# Patient Record
Sex: Female | Born: 1960
Health system: Southern US, Community
[De-identification: ages and names within clinical notes are randomized; demographics above are authoritative.]

## PROBLEM LIST (undated history)

## (undated) DIAGNOSIS — M1990 Unspecified osteoarthritis, unspecified site: Secondary | ICD-10-CM

## (undated) DIAGNOSIS — R238 Other skin changes: Secondary | ICD-10-CM

## (undated) DIAGNOSIS — Z5189 Encounter for other specified aftercare: Secondary | ICD-10-CM

## (undated) DIAGNOSIS — E785 Hyperlipidemia, unspecified: Secondary | ICD-10-CM

## (undated) DIAGNOSIS — D649 Anemia, unspecified: Secondary | ICD-10-CM

## (undated) DIAGNOSIS — R1110 Vomiting, unspecified: Secondary | ICD-10-CM

## (undated) DIAGNOSIS — R0602 Shortness of breath: Secondary | ICD-10-CM

## (undated) DIAGNOSIS — I509 Heart failure, unspecified: Secondary | ICD-10-CM

## (undated) DIAGNOSIS — I499 Cardiac arrhythmia, unspecified: Secondary | ICD-10-CM

## (undated) DIAGNOSIS — K284 Chronic or unspecified gastrojejunal ulcer with hemorrhage: Secondary | ICD-10-CM

## (undated) DIAGNOSIS — R7303 Prediabetes: Secondary | ICD-10-CM

## (undated) DIAGNOSIS — K219 Gastro-esophageal reflux disease without esophagitis: Secondary | ICD-10-CM

## (undated) DIAGNOSIS — M7989 Other specified soft tissue disorders: Secondary | ICD-10-CM

## (undated) DIAGNOSIS — IMO0001 Reserved for inherently not codable concepts without codable children: Secondary | ICD-10-CM

## (undated) DIAGNOSIS — I82409 Acute embolism and thrombosis of unspecified deep veins of unspecified lower extremity: Secondary | ICD-10-CM

## (undated) DIAGNOSIS — K5900 Constipation, unspecified: Secondary | ICD-10-CM

## (undated) DIAGNOSIS — R233 Spontaneous ecchymoses: Secondary | ICD-10-CM

## (undated) DIAGNOSIS — K5792 Diverticulitis of intestine, part unspecified, without perforation or abscess without bleeding: Secondary | ICD-10-CM

## (undated) DIAGNOSIS — I4891 Unspecified atrial fibrillation: Secondary | ICD-10-CM

## (undated) DIAGNOSIS — I2699 Other pulmonary embolism without acute cor pulmonale: Secondary | ICD-10-CM

## (undated) DIAGNOSIS — I739 Peripheral vascular disease, unspecified: Secondary | ICD-10-CM

## (undated) DIAGNOSIS — K274 Chronic or unspecified peptic ulcer, site unspecified, with hemorrhage: Secondary | ICD-10-CM

## (undated) DIAGNOSIS — T8859XA Other complications of anesthesia, initial encounter: Secondary | ICD-10-CM

## (undated) DIAGNOSIS — R1310 Dysphagia, unspecified: Secondary | ICD-10-CM

## (undated) DIAGNOSIS — D241 Benign neoplasm of right breast: Secondary | ICD-10-CM

## (undated) HISTORY — PX: JOINT REPLACEMENT: SHX530

## (undated) HISTORY — PX: TONSILLECTOMY: SUR1361

## (undated) HISTORY — DX: Unspecified osteoarthritis, unspecified site: M19.90

## (undated) HISTORY — DX: Diverticulitis of intestine, part unspecified, without perforation or abscess without bleeding: K57.92

## (undated) HISTORY — DX: Hyperlipidemia, unspecified: E78.5

## (undated) HISTORY — PX: CHOLECYSTECTOMY: SHX55

## (undated) HISTORY — DX: Other specified soft tissue disorders: M79.89

## (undated) HISTORY — DX: Spontaneous ecchymoses: R23.3

## (undated) HISTORY — DX: Chronic or unspecified peptic ulcer, site unspecified, with hemorrhage: K27.4

## (undated) HISTORY — PX: OTHER SURGICAL HISTORY: SHX169

## (undated) HISTORY — DX: Chronic or unspecified gastrojejunal ulcer with hemorrhage: K28.4

## (undated) HISTORY — DX: Acute embolism and thrombosis of unspecified deep veins of unspecified lower extremity: I82.409

## (undated) HISTORY — DX: Unspecified atrial fibrillation: I48.91

## (undated) HISTORY — DX: Constipation, unspecified: K59.00

## (undated) HISTORY — DX: Encounter for other specified aftercare: Z51.89

## (undated) HISTORY — DX: Dysphagia, unspecified: R13.10

## (undated) HISTORY — DX: Other skin changes: R23.8

## (undated) HISTORY — PX: APPENDECTOMY: SHX54

## (undated) HISTORY — DX: Gastro-esophageal reflux disease without esophagitis: K21.9

## (undated) HISTORY — PX: DIAGNOSTIC LAPAROSCOPY: SUR761

## (undated) HISTORY — DX: Other pulmonary embolism without acute cor pulmonale: I26.99

## (undated) HISTORY — DX: Vomiting, unspecified: R11.10

## (undated) HISTORY — DX: Reserved for inherently not codable concepts without codable children: IMO0001

---

## 1997-12-10 ENCOUNTER — Ambulatory Visit (HOSPITAL_COMMUNITY): Admission: RE | Admit: 1997-12-10 | Discharge: 1997-12-10 | Payer: Self-pay | Admitting: Gynecology

## 1998-03-22 ENCOUNTER — Ambulatory Visit (HOSPITAL_COMMUNITY): Admission: RE | Admit: 1998-03-22 | Discharge: 1998-03-22 | Payer: Self-pay | Admitting: Gynecology

## 1999-09-11 ENCOUNTER — Encounter: Payer: Self-pay | Admitting: Emergency Medicine

## 1999-09-11 ENCOUNTER — Emergency Department (HOSPITAL_COMMUNITY): Admission: EM | Admit: 1999-09-11 | Discharge: 1999-09-11 | Payer: Self-pay | Admitting: Emergency Medicine

## 2000-08-13 ENCOUNTER — Encounter: Payer: Self-pay | Admitting: Family Medicine

## 2000-08-13 ENCOUNTER — Ambulatory Visit (HOSPITAL_COMMUNITY): Admission: RE | Admit: 2000-08-13 | Discharge: 2000-08-13 | Payer: Self-pay | Admitting: Family Medicine

## 2000-08-14 ENCOUNTER — Encounter (INDEPENDENT_AMBULATORY_CARE_PROVIDER_SITE_OTHER): Payer: Self-pay | Admitting: *Deleted

## 2000-08-14 ENCOUNTER — Ambulatory Visit (HOSPITAL_COMMUNITY): Admission: RE | Admit: 2000-08-14 | Discharge: 2000-08-15 | Payer: Self-pay

## 2000-08-19 ENCOUNTER — Encounter: Payer: Self-pay | Admitting: General Surgery

## 2000-08-19 ENCOUNTER — Encounter: Admission: RE | Admit: 2000-08-19 | Discharge: 2000-08-19 | Payer: Self-pay | Admitting: General Surgery

## 2001-10-31 ENCOUNTER — Other Ambulatory Visit: Admission: RE | Admit: 2001-10-31 | Discharge: 2001-10-31 | Payer: Self-pay | Admitting: Obstetrics and Gynecology

## 2003-03-01 ENCOUNTER — Other Ambulatory Visit: Admission: RE | Admit: 2003-03-01 | Discharge: 2003-03-01 | Payer: Self-pay | Admitting: Obstetrics and Gynecology

## 2003-09-16 ENCOUNTER — Ambulatory Visit (HOSPITAL_BASED_OUTPATIENT_CLINIC_OR_DEPARTMENT_OTHER): Admission: RE | Admit: 2003-09-16 | Discharge: 2003-09-16 | Payer: Self-pay | Admitting: Orthopedic Surgery

## 2004-08-18 ENCOUNTER — Encounter: Admission: RE | Admit: 2004-08-18 | Discharge: 2004-08-18 | Payer: Self-pay | Admitting: Occupational Medicine

## 2004-08-30 ENCOUNTER — Other Ambulatory Visit: Admission: RE | Admit: 2004-08-30 | Discharge: 2004-08-30 | Payer: Self-pay | Admitting: Obstetrics and Gynecology

## 2005-06-15 ENCOUNTER — Ambulatory Visit (HOSPITAL_COMMUNITY): Admission: RE | Admit: 2005-06-15 | Discharge: 2005-06-15 | Payer: Self-pay | Admitting: Orthopaedic Surgery

## 2005-07-27 ENCOUNTER — Ambulatory Visit (HOSPITAL_BASED_OUTPATIENT_CLINIC_OR_DEPARTMENT_OTHER): Admission: RE | Admit: 2005-07-27 | Discharge: 2005-07-27 | Payer: Self-pay | Admitting: Orthopedic Surgery

## 2005-08-09 ENCOUNTER — Encounter: Admission: RE | Admit: 2005-08-09 | Discharge: 2005-11-07 | Payer: Self-pay | Admitting: Orthopaedic Surgery

## 2006-11-28 ENCOUNTER — Emergency Department (HOSPITAL_COMMUNITY): Admission: EM | Admit: 2006-11-28 | Discharge: 2006-11-28 | Payer: Self-pay | Admitting: Family Medicine

## 2007-03-28 ENCOUNTER — Emergency Department (HOSPITAL_COMMUNITY): Admission: EM | Admit: 2007-03-28 | Discharge: 2007-03-28 | Payer: Self-pay | Admitting: Family Medicine

## 2008-01-05 HISTORY — PX: LAPAROSCOPIC GASTRIC BANDING: SHX1100

## 2008-03-03 ENCOUNTER — Ambulatory Visit: Payer: Self-pay | Admitting: Vascular Surgery

## 2008-04-13 ENCOUNTER — Encounter (INDEPENDENT_AMBULATORY_CARE_PROVIDER_SITE_OTHER): Payer: Self-pay | Admitting: Obstetrics and Gynecology

## 2008-04-13 ENCOUNTER — Ambulatory Visit (HOSPITAL_COMMUNITY): Admission: RE | Admit: 2008-04-13 | Discharge: 2008-04-13 | Payer: Self-pay | Admitting: Obstetrics and Gynecology

## 2008-06-04 ENCOUNTER — Ambulatory Visit: Payer: Self-pay | Admitting: Vascular Surgery

## 2008-06-05 HISTORY — PX: ENDOVENOUS ABLATION SAPHENOUS VEIN W/ LASER: SUR449

## 2008-06-30 ENCOUNTER — Ambulatory Visit: Payer: Self-pay | Admitting: Vascular Surgery

## 2008-07-07 ENCOUNTER — Ambulatory Visit: Payer: Self-pay | Admitting: Vascular Surgery

## 2008-08-31 ENCOUNTER — Ambulatory Visit (HOSPITAL_COMMUNITY): Admission: RE | Admit: 2008-08-31 | Discharge: 2008-08-31 | Payer: Self-pay | Admitting: Surgery

## 2008-09-02 ENCOUNTER — Ambulatory Visit (HOSPITAL_COMMUNITY): Admission: RE | Admit: 2008-09-02 | Discharge: 2008-09-02 | Payer: Self-pay | Admitting: Surgery

## 2008-09-06 ENCOUNTER — Ambulatory Visit (HOSPITAL_BASED_OUTPATIENT_CLINIC_OR_DEPARTMENT_OTHER): Admission: RE | Admit: 2008-09-06 | Discharge: 2008-09-06 | Payer: Self-pay | Admitting: Surgery

## 2008-09-11 ENCOUNTER — Ambulatory Visit: Payer: Self-pay | Admitting: Internal Medicine

## 2008-09-15 ENCOUNTER — Encounter: Admission: RE | Admit: 2008-09-15 | Discharge: 2008-12-14 | Payer: Self-pay | Admitting: Surgery

## 2009-01-04 ENCOUNTER — Ambulatory Visit (HOSPITAL_COMMUNITY): Admission: RE | Admit: 2009-01-04 | Discharge: 2009-01-05 | Payer: Self-pay | Admitting: Surgery

## 2009-01-18 ENCOUNTER — Encounter: Admission: RE | Admit: 2009-01-18 | Discharge: 2009-02-02 | Payer: Self-pay | Admitting: Surgery

## 2009-03-02 ENCOUNTER — Encounter: Admission: RE | Admit: 2009-03-02 | Discharge: 2009-05-31 | Payer: Self-pay | Admitting: Surgery

## 2009-07-07 ENCOUNTER — Encounter: Admission: RE | Admit: 2009-07-07 | Discharge: 2009-07-07 | Payer: Self-pay | Admitting: Surgery

## 2009-08-16 ENCOUNTER — Encounter: Admission: RE | Admit: 2009-08-16 | Discharge: 2009-10-27 | Payer: Self-pay | Admitting: Orthopaedic Surgery

## 2009-11-09 ENCOUNTER — Emergency Department (HOSPITAL_COMMUNITY): Admission: EM | Admit: 2009-11-09 | Discharge: 2009-11-09 | Payer: Self-pay | Admitting: Emergency Medicine

## 2009-12-15 ENCOUNTER — Encounter
Admission: RE | Admit: 2009-12-15 | Discharge: 2009-12-15 | Payer: Self-pay | Source: Home / Self Care | Attending: Surgery | Admitting: Surgery

## 2010-03-06 ENCOUNTER — Ambulatory Visit
Admission: RE | Admit: 2010-03-06 | Discharge: 2010-03-06 | Payer: Self-pay | Source: Home / Self Care | Attending: Family Medicine | Admitting: Family Medicine

## 2010-03-06 DIAGNOSIS — M25559 Pain in unspecified hip: Secondary | ICD-10-CM | POA: Insufficient documentation

## 2010-03-15 NOTE — Assessment & Plan Note (Signed)
Summary: HAD CORTISONE SHOT IN HIP LAST WEEK PAIN IS WORSE/EPIC   Vital Signs:  Patient profile:   49 year old female Height:      66 inches Weight:      280 pounds BMI:     45.36 Pulse rate:   55 / minute BP sitting:   145 / 87  (right arm)  Vitals Entered By: Rochele Pages RN (March 06, 2010 2:54 PM) CC: rt hip inj last thurs- pain much worse now   CC:  rt hip inj last thurs- pain much worse now.  History of Present Illness: [name] presents to clinic today with extreme right hip and gleuteus pain.  She had a greater trochanteric bursa injection 4 days ago at the PCP. The physician was not confident that he hit the right area. Following the injection [name] had mild pain but nothing significant. However the day prior to presentation (Sunday) [name]. steped down 1 step normally when she felt immeadate and sharp gleuteus pain in ther right. She had painful walking. She tried resting and NSAIDs without much help. She denies any radiating pain, weakness, or numbness. No burising per pt.  Pain how with motion and at rest. Worse with walking and moving legs.   Habits & Providers  Alcohol-Tobacco-Diet     Tobacco Status: never  Current Problems (verified): 1)  Hip Pain, Right  (ICD-719.45)  Allergies (verified): 1)  ! Pcn  Past History:  Past Medical History: Obesity  HTN  Social History: Smoking Status:  never  Review of Systems  The patient denies anorexia, fever, and weight loss.    Physical Exam  General:  Obese, VS noted .In pain appearing.  Msk:  Right side and hip are normall appearing.  Nontender over greater trochanter.  TTP over middle right gleuteus region.  HIP ROM is normal in and ext. Painfil flexion, extension and abduction.  Weak abduction, flex and ext.  FABER + pain in gleuteus.   Gait: Trundelenberg gait due to pain.  Able to stand on either foot with hips level.    Impression & Recommendations:  Problem # 1:  Preventive Health Care  (ICD-V70.0) Assessment New Uncertain diagnosis at this point.  I do not think there is a radicular cause for this pain.  I am concerned about a stress fracture of the pelvis vs tear in the one of the gleuteus muscles.  Plan: Evaluate for fracture with a palvic CT scan without sonctrast. They also may be able to visulize a large tear in the gleuteus msucles.  Will follow up based on CT scan results.  May require MRI if still painful in a few weeks.  NSAIDS for pain. Pt declines opiates.   Other Orders: CT without Contrast (CT w/o contrast)  Patient Instructions: 1)  Thank you for seeing me today. 2)  We will contact you with CT scan results. We will call you. 3)  Refrain from working out until we call you.  4)  Aleve for pain.    Orders Added: 1)  CT without Contrast [CT w/o contrast] 2)  New Patient Level III [16109]

## 2010-03-29 ENCOUNTER — Encounter (INDEPENDENT_AMBULATORY_CARE_PROVIDER_SITE_OTHER): Payer: Commercial Managed Care - PPO | Admitting: Vascular Surgery

## 2010-03-29 ENCOUNTER — Encounter (INDEPENDENT_AMBULATORY_CARE_PROVIDER_SITE_OTHER): Payer: Commercial Managed Care - PPO

## 2010-03-29 DIAGNOSIS — I8310 Varicose veins of unspecified lower extremity with inflammation: Secondary | ICD-10-CM

## 2010-03-29 DIAGNOSIS — I83893 Varicose veins of bilateral lower extremities with other complications: Secondary | ICD-10-CM

## 2010-03-30 NOTE — Assessment & Plan Note (Signed)
OFFICE VISIT  KILA, GODINA DOB:  11/30/1960                                       03/29/2010 ZOXWR#:[id number]  Teosha Casso presents today for continued discussion of discomfort in her left leg.  She is well-known to me from a prior laser ablation of her left great saphenous vein in May 2010.  She had follow-up duplex showing closure of the saphenous vein.  She reports that she has had recurrent symptoms of discomfort over tributary varicosities over her pretibial area on the left and has had a recurrence of a heavy sensation there as well.  Her venous pressures  are unchanged.  PHYSICAL EXAMINATION:  Obese white female in no acute distress. Blood pressure 143/95, pulse 71, respirations 18. HEENT:  Normal. MUSCULOSKELETAL:  Shows no major deformities or cyanosis. NEUROLOGIC:  No focal weakness or paresthesias. SKIN:  Without ulcers or rashes.  She does have prominent telangiectasia and small varices over her pretibial area.  She did undergo repeat duplex and this shows no evidence of DVT.  She does have recanalization of her left great saphenous vein.  On imaging of this by myself, she does have closure of the area around the knee and reopening just above the knee up to below the level of the saphenofemoral junction.  I discussed options with Ms. Tobler.  I do feel that she has had recurrent symptoms of hypertension.  I explained that it is somewhat unusual to have recanalization but possible after laser ablation.  I explained treatment would be a repeat ablation of her great saphenous vein for closure and improvement of her venous hypertension.  She has worn compression garments more so than not and will attempt to again wear these thigh-high 20-30 mmHg to see if we can improve her symptomatology with compression alone and I will see her again in 3 months for further discussion.    Larina Earthly, M.D. Electronically Signed  TFE/MEDQ   D:  03/29/2010  T:  03/30/2010  Job:  [id number]

## 2010-04-11 NOTE — Procedures (Unsigned)
LOWER EXTREMITY VENOUS REFLUX EXAM  INDICATION:  Follow up left GSV ablation in 2010.  EXAM:  Using color-flow imaging and pulse Doppler spectral analysis, the left common femoral, superficial femoral, popliteal, posterior tibial, greater and lesser saphenous veins are evaluated.  There is no evidence suggesting deep venous insufficiency in the left lower extremity.  The left saphenofemoral junction is not competent. The left GSV is not competent with the caliber as described below.  The left proximal short saphenous vein demonstrates competency.  GSV Diameter (used if found to be incompetent only)                                           Right    Left Proximal Greater Saphenous Vein           cm       0.54 cm Proximal-to-mid-thigh                     cm       0.36 cm Mid thigh                                 cm       0.35 cm Mid-distal thigh                          cm Distal thigh                              cm       0.44 cm Knee                                      cm       0.34 cm  IMPRESSION: 1. Left greater saphenous vein is not competent with reflux > 500     milliseconds . 2. The left greater saphenous vein is not tortuous above the knee. 3. The deep venous system is competent. 4. The left short saphenous vein is competent.          ___________________________________________ Larina Earthly, M.D.  LT/MEDQ  D:  03/29/2010  T:  03/29/2010  Job:  [id number]

## 2010-04-19 LAB — POCT I-STAT, CHEM 8
HCT: 45 % (ref 36.0–46.0)
Hemoglobin: 15.3 g/dL — ABNORMAL HIGH (ref 12.0–15.0)
Potassium: 4.7 mEq/L (ref 3.5–5.1)
Sodium: 140 mEq/L (ref 135–145)
TCO2: 27 mmol/L (ref 0–100)

## 2010-04-19 LAB — CBC
MCH: 30.3 pg (ref 26.0–34.0)
MCHC: 32.7 g/dL (ref 30.0–36.0)
MCV: 92.6 fL (ref 78.0–100.0)
Platelets: 152 10*3/uL (ref 150–400)
RBC: 4.88 MIL/uL (ref 3.87–5.11)
RDW: 14.1 % (ref 11.5–15.5)

## 2010-04-19 LAB — DIFFERENTIAL
Basophils Absolute: 0 10*3/uL (ref 0.0–0.1)
Basophils Relative: 1 % (ref 0–1)
Eosinophils Absolute: 0.2 10*3/uL (ref 0.0–0.7)
Eosinophils Relative: 3 % (ref 0–5)
Neutrophils Relative %: 60 % (ref 43–77)

## 2010-04-19 LAB — POCT CARDIAC MARKERS
CKMB, poc: <1 ng/mL — ABNORMAL LOW (ref 1.0–8.0)
Troponin i, poc: <0.05 ng/mL (ref 0.00–0.09)

## 2010-05-09 LAB — GLUCOSE, CAPILLARY
Glucose-Capillary: 117 mg/dL — ABNORMAL HIGH (ref 70–99)
Glucose-Capillary: 96 mg/dL (ref 70–99)

## 2010-05-09 LAB — CBC
HCT: 41 % (ref 36.0–46.0)
MCV: 88.8 fL (ref 78.0–100.0)
Platelets: 165 10*3/uL (ref 150–400)
RBC: 4.61 MIL/uL (ref 3.87–5.11)
WBC: 6.8 10*3/uL (ref 4.0–10.5)

## 2010-05-09 LAB — DIFFERENTIAL
Eosinophils Absolute: 0.1 10*3/uL (ref 0.0–0.7)
Lymphocytes Relative: 24 % (ref 12–46)
Lymphs Abs: 1.6 10*3/uL (ref 0.7–4.0)
Monocytes Relative: 6 % (ref 3–12)
Neutrophils Relative %: 68 % (ref 43–77)

## 2010-05-10 LAB — DIFFERENTIAL
Basophils Absolute: 0 10*3/uL (ref 0.0–0.1)
Basophils Relative: 1 % (ref 0–1)
Eosinophils Absolute: 0.2 10*3/uL (ref 0.0–0.7)
Monocytes Relative: 7 % (ref 3–12)
Neutro Abs: 3.1 10*3/uL (ref 1.7–7.7)
Neutrophils Relative %: 53 % (ref 43–77)

## 2010-05-10 LAB — GLUCOSE, CAPILLARY
Glucose-Capillary: 80 mg/dL (ref 70–99)
Glucose-Capillary: 90 mg/dL (ref 70–99)

## 2010-05-10 LAB — COMPREHENSIVE METABOLIC PANEL
Alkaline Phosphatase: 53 U/L (ref 39–117)
BUN: 17 mg/dL (ref 6–23)
CO2: 28 mEq/L (ref 19–32)
Chloride: 102 mEq/L (ref 96–112)
Creatinine, Ser: 0.49 mg/dL (ref 0.4–1.2)
GFR calc non Af Amer: >60 mL/min (ref >60)
Glucose, Bld: 83 mg/dL (ref 70–99)
Potassium: 4 mEq/L (ref 3.5–5.1)
Total Bilirubin: 0.9 mg/dL (ref 0.3–1.2)

## 2010-05-10 LAB — CBC
HCT: 42.2 % (ref 36.0–46.0)
Hemoglobin: 14.3 g/dL (ref 12.0–15.0)
RBC: 4.74 MIL/uL (ref 3.87–5.11)

## 2010-05-10 LAB — PREGNANCY, URINE

## 2010-05-18 LAB — BASIC METABOLIC PANEL
BUN: 8 mg/dL (ref 6–23)
Creatinine, Ser: 0.57 mg/dL (ref 0.4–1.2)
GFR calc non Af Amer: >60 mL/min (ref >60)
Glucose, Bld: 116 mg/dL — ABNORMAL HIGH (ref 70–99)
Potassium: 4.7 mEq/L (ref 3.5–5.1)

## 2010-05-18 LAB — CBC
HCT: 44.2 % (ref 36.0–46.0)
Hemoglobin: 14.9 g/dL (ref 12.0–15.0)
MCV: 90.6 fL (ref 78.0–100.0)
RDW: 14.7 % (ref 11.5–15.5)

## 2010-06-20 NOTE — Assessment & Plan Note (Signed)
OFFICE VISIT   KENZEE, BASSIN  DOB:  08/07/1960                                       06/04/2008  ZOXWR#:[id number]   Patient presents today for continued followup of her left leg venous  hypertension.  She has worn graduated compression garments for 3 months.  She reports that this has provided no significant improvement in her  pain with prolonged standing.  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  Also has  difficulty with gardening, cleaning, and cooking due to leg pain and  swelling.  She does not have any symptoms on the right leg.   I reviewed her duplex with the patient.  She does have marked reflux  throughout her greater saphenous vein on the left.  She also has  telangiectasia and reticular veins that are quite prominent on her ankle  that have not bled but on physical exam appear to be at risk for this.  I have recommended laser ablation of her left great saphenous vein and  sclerotherapy at the same setting around the reticular concerns at her  ankle.  Discussed the potential risk of DVT with a very low risk and  also potential for nonclosure of her saphenous vein, also of low risk.  She understands and wishes to proceed.  We can get her scheduled.   Larina Earthly, M.D.  Electronically Signed   TFE/MEDQ  D:  06/04/2008  T:  06/07/2008  Job:  [id number]

## 2010-06-20 NOTE — Op Note (Signed)
NAMECECELIA, [name] NO.:  [id number]   MEDICAL RECORD NO.:  [id number]          PATIENT TYPE:  AMB   LOCATION:  [facility]                           FACILITY:  [facility]   PHYSICIAN:  Juluis Mire, M.D.   DATE OF BIRTH:  1960/04/08   DATE OF PROCEDURE:  04/13/2008  DATE OF DISCHARGE:                               OPERATIVE REPORT   PREOPERATIVE DIAGNOSIS:  Endometrial polyp.   POSTOPERATIVE DIAGNOSIS:  Endometrial polyp.   OPERATIVE PROCEDURE:  Cervical dilatation, hysteroscopy, resection of  endometrium.  Endometrial curettings.   SURGEON:  Juluis Mire, MD   ANESTHESIA:  General.   ESTIMATED BLOOD LOSS:  Minimal.   PACKS AND DRAINS:  None.   INTRAOPERATIVE BLOOD PLACED:  None.   COMPLICATIONS:  None.   INDICATIONS:  As dictated in history and physical.   Procedure as follows.  The patient taken to OR and placed in supine  position.  After a satisfactory level of general anesthesia was  obtained, the patient was placed in dorsal supine position using the  Allen stirrups.  The perineum, vagina were prepped out with Betadine.  The patient draped as a sterile field.  A spec was placed in the vaginal  vault.  The cervix was grasped with single-tooth tenaculum.  Paracervical blocks instilled using 1% lidocaine.  Uterus sounded to  approximately 9 cm, serially dilated to a size 31 Pratt dilator.  Operative hysteroscope was introduced.  Intrauterine cavity was extended  using sorbitol.  Visualization revealed some shaggy endometrium.  I  could not see a distinct polyp, it may have been disrupted by the  dilation.  We resected the majority of fluffy endometrium along with  other areas of the endometrium, this was also sent for Pathology.  We  also did endometrial curettings.  Total deficit was 250 mL.  The single-  tooth tenaculum and speculum then removed.  The patient taken out of the  dorsal supine position, once alert and extubated, transferred to  recovery  in good condition.  Sponge, instrument, and needle count was  reported as correct by circulating nurse.      Juluis Mire, M.D.  Electronically Signed    JSM/MEDQ  D:  04/13/2008  T:  04/13/2008  Job:  [id number]

## 2010-06-20 NOTE — Assessment & Plan Note (Signed)
OFFICE VISIT   ALEEN, MARSTON  DOB:  1960-07-08                                       07/07/2008  KGMWN#:[id number]   [name] presents today for 1-week followup of laser ablation of  her left great saphenous vein from her knee to her groin.  She reports  some irritation from the stocking and her the popliteal fossa, otherwise  having no difficulty.  She did have generalized malaise for several days  following the procedure.  I explained to her that I have not seen this  in the past.  She feels this may have been a viral syndrome and I  suspect this is the most likely cause.  This has resolved.  She has mild  bruising today and mild tenderness.  The area of sclerotherapy and her  telangiectasia over her pretibial area are all occluded and appear to be  resolving well.  She underwent formal duplex today in our office and  this shows no evidence of DVT.  She does have complete occlusion of her  left great saphenous vein from the knee to the groin.  I am quite  pleased with her initial result as is Ms. Hamor.  We will see her  again on an as-needed basis.   Larina Earthly, M.D.  Electronically Signed   TFE/MEDQ  D:  07/07/2008  T:  07/08/2008  Job:  [id number]   cc:   Desmond Dike, MD

## 2010-06-20 NOTE — H&P (Signed)
NAME:  ABBEE, [name] NO.:  [id number]   MEDICAL RECORD NO.:  [id number]          PATIENT TYPE:  AMB   LOCATION:  [facility]                           FACILITY:  [facility]   PHYSICIAN:  Juluis Mire, M.D.   DATE OF BIRTH:  1960/09/02   DATE OF ADMISSION:  DATE OF DISCHARGE:                              HISTORY & PHYSICAL   The patient is a 50 year old nulligravida female who presents for  hysteroscopy in relation to the present admission.  The patient has had  longstanding history of anovulatory cycles due to polycystic ovarian  syndrome.  A saline infusion ultrasound did reveal a large endometrial  polyp.  She now presents for hysteroscopic resection to rule out  endometrial hyperplasia or other issues.   In terms of allergies, she is allergic to PENICILLIN.   MEDICATIONS:  Metformin 1500 mg daily, Celebrex as needed, and an  aspirin each day.   PAST MEDICAL HISTORY:  She has had longstanding history of polycystic  ovarian syndrome.  Because of this, she does have glucose intolerance  for which she is on metformin.   In terms of surgery, she has had a previous left salpingo-oophorectomy  and right cystectomy with a saline with a finding of serous  cystadenomas.  She has had previous cholecystectomy.  She has had right  and left release of the carpal tunnel.   OBSTETRICAL HISTORY:  None.   FAMILY HISTORY:  Noncontributory.   SOCIAL HISTORY:  No tobacco or alcohol use.   REVIEW OF SYSTEMS:  Noncontributory.   PHYSICAL EXAMINATION:  GENERAL:  The patient is afebrile.  VITAL SIGNS:  Stable.  HEENT:  The patient is normocephalic.  Pupils equal, round, and reactive  to light and accommodation.  Extraocular movements were intact.  Sclerae  and conjunctivae are clear.  Oropharynx is clear.  NECK:  Without thyromegaly.  BREASTS:  Not examined.  Lungs:  Clear.  CARDIAC SYSTEM:  Regular rate without murmurs or gallops.  ABDOMEN:  Benign.  No mass, organomegaly, or  tenderness.  Exam limited  by obesity.  PELVIC:  Normal external genitalia.  Vaginal mucosa clear.  Cervix  unremarkable.  Uterus normal size, shape, and contour.  Adnexa free of  masses or tenderness.  Again, exam is limited by obesity.  EXTREMITIES:  Trace edema.  NEUROLOGIC:  Grossly normal limits.   IMPRESSION:  1. Anovulatory cycle with endometrial polyp.  2. Polycystic ovarian syndrome.  3. Glucose intolerance.   PLAN:  The patient will undergo hysteroscopic evaluation and resection  of polyp.  The risks of surgery have been discussed including the risk  of infection.  The risk of hemorrhage that could require transfusion  with the risk of AIDS or hepatitis.  Excessive bleeding could require  hysterectomy.  There is a risk of injury to adjacent organs including  the bladder or bowel that could require further exploratory surgery.  Risk of deep venous thrombosis and pulmonary embolus.  The patient does  understand indications and potential risks.      Juluis Mire, M.D.  Electronically Signed     JSM/MEDQ  D:  04/13/2008  T:  04/13/2008  Job:  [id number]

## 2010-06-20 NOTE — Procedures (Signed)
Ford, Sherri             ACCOUNT NO.:  [id number]   MEDICAL RECORD NO.:  [id number]          PATIENT TYPE:  OUT   LOCATION:  [facility]EP CENTER                 [facility]ACILITY[facility]   PHYSICIAN:  Clinton D. Maple Hudson, MD, FCCP, FACPDATE OF BIRTH:  April 08, 1960   DATE OF STUDY:  09/06/2008                            NOCTURNAL POLYSOMNOGRAM   REFERRING PHYSICIAN:  Thornton Park. Daphine Deutscher, MD   INDICATION FOR STUDY:  Hypersomnia with sleep apnea.   EPWORTH SLEEPINESS SCORE:  Epworth sleepiness score 8/24, BMI 59.2.  Weight 378 pounds, height 67 inches.  Neck 17.5 inches.   MEDICATIONS:  Home medications charted and reviewed.   SLEEP ARCHITECTURE:  Total sleep time 229 minutes with sleep efficiency  65.8%.  Stage I was 9.6%, stage II 83.6%, stage III absent, REM 6.8% of  total sleep time.  Sleep latency 10.5 minutes, REM latency 155.5  minutes, awake after sleep onset 101 minutes, arousal index 25.2.  Sleep  onset was about 10:30 p.m., awake after 3:00 a.m. and study ended at  4:00 a.m.   RESPIRATORY DATA:  Apnea/hypopnea index (AHI) 0 per hour.  A total of 23  minor events not meeting duration or desaturation criteria to be marked  as apneas or hypopneas, were scored as respiratory effort related  arousals for a cumulative respiratory disturbance index (RDI) of 6 per  hour.  There were insufficient events to permit CPAP titration by split  protocol.   OXYGEN DATA:  Moderately loud snoring with oxygen desaturation to a  nadir of 77%.  Mean oxygen saturation through the study was 91.9% on  room air with a total of 19.7 minutes recorded with oxygen saturation  less than 88% on room air.   CARDIAC DATA:  Sinus rhythm with occasional PVC or PAC.   MOVEMENT-PARASOMNIA:  No significant movement disturbance.  Bathroom x1.   IMPRESSIONS-RECOMMENDATIONS:  1. 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  The study was scheduled and      performed as a standard nocturnal polysomnogram with lights out at   22:23 p.m. and lights on at 4:11 a.m.  She had a short sleep      latency, but was awake after 3:00 a.m.  The sleep center is able to      do daytime studies to accommodate night workers if arranged at the      time of scheduling.  2. Insignificant respiratory sleep disturbance.  AHI 0 per hour      (normal range 0-5 per hour).  There were a few respiratory effort      related arousals, RDI 6 per hour, which some scoring systems are      interpreting as very minimal obstructive sleep apnea.  Usually      conservative measures such as weight loss and side sleeping could      be offered first in this situation.  Moderately loud snoring with      oxygen desaturation to a nadir of 77%.  3. Mean oxygen saturation through the study was 91.9% on room air with      a total of 19.7 minutes recorded with room air oxygen saturation  less than 88%.  Is there underlying cardiopulmonary disease?Joni Fears D. Maple Hudson, MD, [facility], FACP  Diplomate, Biomedical engineer of Sleep Medicine  Electronically Signed     CDY/MEDQ  D:  09/11/2008 10:37:01  T:  09/11/2008 11:27:34  Job:  [id number]

## 2010-06-20 NOTE — Consult Note (Signed)
NEW PATIENT CONSULTATION   [name], [name]  DOB:  06-25-1960                                       03/03/2008  VOZDG#:[id number]   The patient presents today for evaluation of progressive changes of  venous hypertension in her left leg.  She is a very pleasant 50 year old  white female with progressive changes of venous hypertension.  This is  restricted only to her left leg.  She has increasing changes of  hyperpigmentation, itching and discomfort over her medial calf above her  medial ankle.  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  She does have  swelling.  She has worn moderate grade graduated compression garments in  the past but no true graduated compression stockings.  She does not have  any history of deep venous thrombosis.   PAST MEDICAL HISTORY:  Significant only for non-insulin diabetes.   FAMILY HISTORY:  Significant for renal failure, hypertension and  peripheral vascular disease in her father.   SOCIAL HISTORY:  She is married.  She does not have children.  She works  as a Engineer, civil (consulting).  She does not smoke or drink alcohol.   REVIEW OF SYSTEMS:  Her height is reported as 5 feet 7 inches.  She  denies any cardiac, pulmonary, GI or GU dysfunction.  She does have  arthritic difficulty.   ALLERGIES:  Are to penicillin which causes hives.   MEDICATIONS:  Are metformin and multivitamins.   PHYSICAL EXAMINATION:  Vital signs:  Heart rate is 61, blood pressure is  139/84, respirations 18.  General:  She is an obese white female in no  acute distress.  She has 2+ radial and 2+ dorsalis pedis pulses  bilaterally.  She does have a large area of venous hypertension with  hemosiderin deposits and also reticular varicosities on the pretibial  area on the left.   She underwent a formal venous duplex in our office and this shows no  evidence of pathology in her deep venous system.  She does  have  extensive reflux throughout her great saphenous vein causing venous  hypertension.  I had a long discussion with the patient and have  recommended that we begin graduated compression garments for treatment  of her venous hypertension.  I explained that she really has progressed  to a degree where I am worried about ulceration as this progresses.  She  has been given a prescription for compression garments today and I plan  to see her again in 3 months.  I did discuss the possible treatment of  laser ablation should she have failure.  I feel that she should achieve  excellent relief of her venous hypertension if this is required.  I  would also recommend sclerotherapy at that time for the reticular area  in the skin where she has the hyperpigmentation.  She understands.  We  will see her again in 3 months for continued discussion.   Larina Earthly, M.D.  Electronically Signed   TFE/MEDQ  D:  03/03/2008  T:  03/04/2008  Job:  [id number]   cc:   Dr Desmond Dike

## 2010-06-20 NOTE — Assessment & Plan Note (Signed)
OFFICE VISIT   VERDA, MEHTA  DOB:  12-17-1960                                       06/30/2008  JYNWG#:[id number]   The patient presents today for laser ablation of her left great  saphenous vein and sclerotherapy of reticular telangiectasia around her  left distal calf and ankle.  She tolerated the procedure  without  immediate complication and will be seen again in 1 week with ultrasound  follow-up.   Larina Earthly, M.D.  Electronically Signed   TFE/MEDQ  D:  06/30/2008  T:  07/01/2008  Job:  [id number]

## 2010-06-20 NOTE — Procedures (Signed)
DUPLEX DEEP VENOUS EXAM - LOWER EXTREMITY   INDICATION:  One week followup left greater saphenous vein laser  ablation.   HISTORY:  Edema:  No  Trauma/Surgery:  Left greater saphenous vein laser ablation on  06/30/2008.  Pain:  Tenderness at the left distal thigh level  PE:  No  Previous DVT:  No  Anticoagulants:  Other:   DUPLEX EXAM:                CFV   SFV   PopV  PTV    GSV                R  L  R  L  R  L  R   L  R  L  Thrombosis    0  0     0     0      0     +  Spontaneous   +  +     +     +      +     0  Phasic        +  +     +     +      +     0  Augmentation  +  +     +     +      +     0  Compressible  +  +     +     +      +     0  Competent     +  +     +     +      +     0   Legend:  + - yes  o - no  p - partial  D - decreased   IMPRESSION:  1. No evidence of deep venous thrombosis noted in the left lower      extremity.  2. Total occlusion of the  left greater saphenous vein noted from the      distal insertion site to the groin area.  3. Reflux is noted at the left saphenofemoral junction level which      extends into the left lateral accessory saphenous vein.        _____________________________  Larina Earthly, M.D.   CH/MEDQ  D:  07/07/2008  T:  07/07/2008  Job:  [id number]

## 2010-06-20 NOTE — Procedures (Signed)
LOWER EXTREMITY VENOUS REFLUX EXAM   INDICATION:  Left lower extremity varicose vein with pain.   EXAM:  Using color-flow imaging and pulse Doppler spectral analysis, the  left common femoral, superficial femoral, popliteal, posterior tibial,  greater and lesser saphenous veins are evaluated.  There is no evidence  suggesting deep venous insufficiency in the left lower extremity.   The left saphenofemoral junction is competent.  The left GSV is not  competent with the caliber as described below.   The left proximal short saphenous vein demonstrates competency.   GSV Diameter (used if found to be incompetent only)                                            Right    Left  Proximal Greater Saphenous Vein           cm       0.82 cm  Proximal-to-mid-thigh                     cm       0.82 cm  Mid thigh                                 cm       1.17 cm  Mid-distal thigh                          cm       1.17 cm  Distal thigh                              cm       0.81 cm  Knee                                      cm       0.67 cm   IMPRESSION:  1. Left greater saphenous vein reflux is identified with the caliber      ranging from 0.67 to 1.17 cm knee to groin.  2. The left greater saphenous vein is not aneurysmal.  3. The left greater saphenous vein is not tortuous.  4. The deep venous system is competent.  5. The left lesser saphenous vein is competent.  6. No evidence of deep venous thrombosis noted in the left leg.       ___________________________________________  Larina Earthly, M.D.   MG/MEDQ  D:  03/03/2008  T:  03/03/2008  Job:  [id number]

## 2010-06-23 NOTE — Op Note (Signed)
[name], [name]             ACCOUNT NO.:  [id number]   MEDICAL RECORD NO.:  [id number]          PATIENT TYPE:  AMB   LOCATION:  DSC                          FACILITY:  [facility]   PHYSICIAN:  [name], M.D.DATE OF BIRTH:  26-Jul-1960   DATE OF PROCEDURE:  DATE OF DISCHARGE:  06/15/2005                                 OPERATIVE REPORT   PREOPERATIVE DIAGNOSIS:  Left carpal tunnel syndrome.   POSTOPERATIVE DIAGNOSIS:  Left carpal tunnel syndrome.   PROCEDURE:  1.  Left median nerve/peripheral nerve block at wrist __________ carpal      tunnel release.  2.  Left limited open carpal tunnel release.   SURGEON:  [name], M.D.   ASSISTANT:  None.   COMPLICATIONS:  None.   ANESTHESIA:  Peripheral nerve block with IV sedation, keeping the patient  awake, alert and oriented the entire case.   TOURNIQUET TIME:  Less than 10 minutes.   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  She has done  excellent from this.  She presents with left upper extremity symptoms and  desires to proceed with the above-mentioned operative intervention  understanding and accepting the risks and benefits of surgery including  risks of infection, bleeding, anesthesia, damage to normal structures and  failure of surgery to accomplished the intended goals, relieving symptoms  and restoring function.  With this in mind she desires to proceed.  All  questions have been encouraged and answered preoperatively.   OPERATIVE FINDINGS:  The patient had a thickened transverse carpal ligament.  It separated wide upon its incision.  There were no specimens or confined  lesions in the canal; however, this was limited to open approach and deep  canal inspection was not carried out.   DETAILS OF PROCEDURE:  The patient was seen by myself and anesthesia,  extremity examined, correct limb to be operated on  verified, and she was  taken to the procedural suite and underwent a smooth induction of light IV  sedation and was then given a peripheral nerve/median nerve block at the  wrist __________ for anesthetic purposes for carpal tunnel release.  Once  median nerve block was complete I then had her prepped and draped the second  time with Betadine scrub and paint.  The arm was elevated, tourniquet was  insufflated to 250 mmHg and a 1 cm incision was made at the distal edge of  the transverse carpal ligament.  Dissection was carried down.  The palmar  fascia incised.  The distal edge of the transverse carpal ligament was  released under 4.0 loop magnification and following this distal to proximal  dissection was carried out until adequate room was available for canal  __________  device 1, 2 and 3 which were placed directly in midline without  patient discomfort, problems or friction.  I then placed a security clip,  obturator disengaged, security knife was placed in the security clip  effectively releasing the proximal leaf of the transverse carpal ligament.  The canal was evaluated.  It was completely released in terms of the  transverse carpal ligament and median nerve rested freely without adhesive  issues or problems.  I deflated the tourniquet, irrigated copiously and  closed the wound with Prolene.  Sterile dressing was applied.  She had  excellent refill, excellent range of motion of the fingers and no  complicating features.  She will return to the office to see me in 7 days,  notify me should any problems occur.  All questions have been encouraged,  answered.           ______________________________  [name], M.D.     Nash Mantis  D:  07/27/2005  T:  07/27/2005  Job:  [id number]

## 2010-06-23 NOTE — Op Note (Signed)
[name], [name]                       ACCOUNT NO.:  [id number]   MEDICAL RECORD NO.:  [id number]                   PATIENT TYPE:  AMB   LOCATION:  DSC                                  FACILITY:  [facility]   PHYSICIAN:  Dionne Ano. Everlene Other, M.D.         DATE OF BIRTH:  1960-06-16   DATE OF PROCEDURE:  09/16/2003  DATE OF DISCHARGE:                                 OPERATIVE REPORT   PREOPERATIVE DIAGNOSIS:  Right carpal tunnel syndrome.   POSTOPERATIVE DIAGNOSIS:  Right carpal tunnel syndrome.   OPERATION PERFORMED:  1. Right median nerve/peripheral nerve block at the wrist and forearm level     for anesthetic purposes for carpal tunnel release.  2. Right limited open carpal tunnel release.   SURGEON:  Dionne Ano. [name], M.D.   ASSISTANT:  Karie Chimera, P.A.-C.   ANESTHESIA:  Peripheral nerve/median nerve block with light intravenous  sedation keeping the patient awake, alert and oriented through the entire  case.   COMPLICATIONS:  None.   TOURNIQUET TIME:  6 minutes.   DRAINS:  None.   INDICATIONS FOR PROCEDURE:  The patient is a 50 year old female who presents  with the above mentioned diagnosis.  I have counseled the patient in regard  to the risks and benefits of bleeding, infection, anesthesia, damage to  normal structures and failure of surgery to accomplish its intended goals of  relieving symptoms and restoring function.  With this in mind, she desires  to proceed.  All questions have been encouraged and answered preoperatively.   OPERATIVE FINDINGS:  The patient had a thickened transverse carpal ligament  __________ .  There were no space occupying lesions in the canal; however,  this was limited to open and deep canal inspection was not carried out.  She  tolerated the procedure well.  There were no complicating features.  The  patient had no obvious space occupying lesions or defects in the median  nerve noted.  There was no aberrant median nerve  anatomy.   DESCRIPTION OF PROCEDURE:  The patient was seen by myself and anesthesia,  was taken to the operative suite, underwent a smooth induction of median  nerve/peripheral nerve block with lidocaine and Marcaine without  epinephrine.  She was given light IV sedation for this but was kept awake,  alert and oriented the entire time.  Following this, a second scrub and  paint was accomplished with Betadine scrub and Betadine paint. Following  this,  the arm was elevated, tourniquet was insufflated to 250 mmHg.  An  incision was made under 275 mm tourniquet control at the distal edge of the  transverse carpal ligament coursing in line with the radial border of the  ring finger and extending proximally 1 to 1.5 cm.  Dissection was carried  down, palmar fascia incised and distal edge of transverse carpal ligament  was then released under 4.0 loupe magnification.  Following this, the  patient then underwent full  release distally.  The distal edge was released.  Fat pad egressed nicely, additional palmar bands were released.  There was  no aberrant median nerve anatomy.  She then underwent a dissection in a  distal to proximal direction until adequate __________  1, 2 and 3 which  were placed directly in midline without patient discomfort or problems.  Following this, security clip was placed, obturator disengaged.  This was  placed just under the proximal leading leaflet of the transverse carpal  ligament. This was done to my satisfaction without difficulty.  Following  this, security knife was placed and security clip, __________ the transverse  carpal ligament.  The patient tolerated the procedure well.  There were no  complicating features.  Once this was done, I then checked the canal,  irrigated copiously, obtained hemostasis with bipolar electrocautery with  the tourniquet deflated and then the patient underwent closure of the wound  with interrupted Prolene.  Sterile dressing was  applied.  The patient  tolerated the procedure well, had excellent refill, soft compartments. There  were no complicating features.  She will return to the office to see [place] in  seven days for check.  Therapy in 14 days.  I have discussed with her do's  and don't's, etc.  Vicodin, Robaxin were written for pain and postoperative  analgesia.  All questions have been encouraged and answered.  We will monitor his condition quite closely and continue IV antibiotics.  It  has been a pleasure to participate in the patient's care.  Preoperatively,  the patient's skin conditions looked excellent, I should note.                                               Dionne Ano. Everlene Other, M.D.    Nash Mantis  D:  09/16/2003  T:  09/17/2003  Job:  [id number]

## 2010-06-23 NOTE — Op Note (Signed)
[place]. [facility]  Patient:    [name], [name]                      MRN: [id number] Proc. Date: 08/14/00 Adm. Date:  [id number] Attending:  Meredith Leeds                           Operative Report  PREOPERATIVE DIAGNOSIS:  Symptomatic gallstones.  POSTOPERATIVE DIAGNOSIS:  Symptomatic gallstones.  PROCEDURE:  Laparoscopic cholecystectomy.  SURGEON:  Zigmund Daniel, M.D.  ASSISTANT:  _____, R.N.F.A.  DESCRIPTION OF PROCEDURE:  After the patient had adequate monitoring and anesthesia and routine preparation and draping of the abdomen, I made a short midline infraumbilical incision, dissected down through the scar and fat to the fascia, and opened it in the midline.  I opened the peritoneum bluntly.  I put in a Hasson cannula, securing it with a pursestring 0 Vicryl stitch.  I then did laparoscopy.  I saw no abnormalities except for slightly distended, slightly inflamed gallbladder.  I placed a 10 mm epigastric port and two 5 mm right upper quadrant ports, retracted the fundus of the gallbladder upward. With the patient in the head up, foot down, and left tilted position, I gained view of the infundibulum of the gallbladder and grasped it and pulled it laterally.  There were some adhesions, which had to be taken down.  Because of the patients obesity, visualization of the infundibulum and hepatoduodenal ligament were difficult, even with 30 degree viewing scope.  I very carefully dissected in the hepatoduodenal ligament and identified the cystic artery and clipped it with three clips and divided it.  I then carefully dissected further until I could see the cystic duct clearly emerging from the infundibulum of the gallbladder, and I placed four clips on it and cut between the two clips which were closest to the gallbladder.  I then further dissected up the gallbladder wall on each side with the hook cautery.  I made an inadvertent hole in the  gallbladder and some bile spilled and some stones spilled, and I sucked it away.  After getting the gallbladder almost detached, I again inspected the clips and found them to be secure, got good hemostasis in the gallbladder fossa, and then detached the gallbladder from the liver.  I placed it into a plastic pouch and after irrigating the operative area and removing the irrigant, I removed the gallbladder through the umbilical incision and tied the pursestring suture.  I then removed the lateral ports under direct vision.  I removed the epigastric port after releasing the CO2. Sponge, needle, and instrument counts were correct.  The patient was stable through the operation.  I closed all the incisions with intracuticular 4-0 Vicryl and Steri-Strips. DD:  08/14/00 TD:  08/15/00 Job: [id number]

## 2010-06-28 ENCOUNTER — Ambulatory Visit (INDEPENDENT_AMBULATORY_CARE_PROVIDER_SITE_OTHER): Payer: Commercial Managed Care - PPO | Admitting: Vascular Surgery

## 2010-06-28 DIAGNOSIS — I83893 Varicose veins of bilateral lower extremities with other complications: Secondary | ICD-10-CM

## 2010-06-28 NOTE — Assessment & Plan Note (Signed)
OFFICE VISIT  [name], [name] DOB:  Dec 08, 1960                                       06/28/2010 EAVWU#:[id number]  Patient presents today for continued followup of her venous hypertension in her left leg.  She had undergone ablation of her great saphenous vein in 2010 and had closure documented by her duplex.  Today she is here for a 13-month follow-up after my last visit with her.  She has had recurrent symptoms of discomfort, aching with prolonged standing.  She works as a Runner, broadcasting/film/video, and this has made it difficult for her.  She has pain with prolonged standing and sitting at a desk for prolonged periods of time. She has had to stop exercising due to leg pain and also reports household chores such as gardening and cooking are difficult due to leg pain as well.  She does wear compression garments, thigh-high, and has so for 3 months with no relief.  She elevates her legs when possible as well.  She does take ibuprofen for discomfort but continues to have pain.  Her medical history is otherwise unchanged.  Physical exam reveals a normal dorsalis pedis pulse bilaterally.  She does have scattered telangiectasia of her calf.  On re-imaging her saphenous vein by myself with SonoSite, this confirms the study that we saw in February 2012.  She does have recanalization of her great saphenous vein, and this begins at the middle calf and extends proximally.  I feel that she has failed conservative treatment, and I have recommended re-ablation of her saphenous vein as an outpatient in our office.  She understands this and wishes to proceed as soon as we can get her on the schedule.    Larina Earthly, M.D. Electronically Signed  TFE/MEDQ  D:  06/28/2010  T:  06/28/2010  Job:  [id number]  cc:   Dr. Desmond Dike

## 2010-07-20 ENCOUNTER — Encounter: Payer: Self-pay | Admitting: *Deleted

## 2010-07-20 DIAGNOSIS — I83893 Varicose veins of bilateral lower extremities with other complications: Secondary | ICD-10-CM | POA: Insufficient documentation

## 2010-07-20 DIAGNOSIS — M79606 Pain in leg, unspecified: Secondary | ICD-10-CM

## 2010-08-15 ENCOUNTER — Encounter: Payer: Self-pay | Admitting: Vascular Surgery

## 2010-08-24 ENCOUNTER — Encounter: Payer: Self-pay | Admitting: Internal Medicine

## 2010-08-24 ENCOUNTER — Telehealth: Payer: Self-pay | Admitting: *Deleted

## 2010-08-24 ENCOUNTER — Ambulatory Visit (INDEPENDENT_AMBULATORY_CARE_PROVIDER_SITE_OTHER): Payer: Commercial Managed Care - PPO | Admitting: Internal Medicine

## 2010-08-24 VITALS — BP 129/82 | HR 82 | Temp 97.7°F | Ht 67.0 in | Wt 259.4 lb

## 2010-08-24 DIAGNOSIS — L0390 Cellulitis, unspecified: Secondary | ICD-10-CM | POA: Insufficient documentation

## 2010-08-24 DIAGNOSIS — L0291 Cutaneous abscess, unspecified: Secondary | ICD-10-CM

## 2010-08-24 LAB — COMPREHENSIVE METABOLIC PANEL
Alkaline Phosphatase: 51 U/L (ref 39–117)
CO2: 28 mEq/L (ref 19–32)
Creat: 0.63 mg/dL (ref 0.50–1.10)
Glucose, Bld: 82 mg/dL (ref 70–99)
Total Bilirubin: 0.6 mg/dL (ref 0.3–1.2)

## 2010-08-24 NOTE — Assessment & Plan Note (Signed)
Persistent cellulitis.  It appears to respond to clindamycin, potentially due to CA-MRSA.  I am concerned for deeper infection with the persistent nature of it.  I will check a CT of leg for ? Abscess, osteomyelitis (patient intolerant of MRI).  If negative, I will continue with the clinda for an extended period of about 8-12 weeks and trial off antibiotics at that time.  Patient to return after CT and should continue clindamycin for now.

## 2010-08-24 NOTE — Progress Notes (Signed)
  Subjective:    Patient ID: [name], female    DOB: 01/14/1961, 50 y.o.   MRN: [id number]  HPI here as a new patient with persistent cellulitis of the left lower extremity.  She has a history of gastric bypass, PCOS, diabetes, now off meds due to weight loss and over the last month has had left lower extremity cellulitis.  She reports no previous history of this.  It started with pain, swelling and erythema and initial antibiotics incuded Keflex and IM Rocephin, but had little resolution and was started on clindamycin.  She took a 10 day course and it improved, but within 3 days off, it returned. She then restarted clindamycin and again has been improving and at this time is still on it.  Initiallyy she did have fever, chills and general malaise but since starting clindamycin that has resolved.      Review of Systems  Constitutional: Negative.   HENT: Negative.   Eyes: Negative.   Respiratory: Negative.   Cardiovascular: Negative.   Gastrointestinal: Negative.   Genitourinary: Negative.   Musculoskeletal: Negative.   Skin: Negative.   Neurological: Negative.   Hematological: Negative.   Psychiatric/Behavioral: Negative.        Objective:   Physical Exam  Constitutional: She is oriented to person, place, and time. She appears well-developed and well-nourished.  Cardiovascular: Normal rate, regular rhythm and normal heart sounds.   No murmur heard. Pulmonary/Chest: Effort normal and breath sounds normal. No respiratory distress. She has no wheezes. She has no rales.  Abdominal: Soft. Bowel sounds are normal. She exhibits no distension.  Lymphadenopathy:    She has no cervical adenopathy.  Neurological: She is alert and oriented to person, place, and time.  Skin:       Left lower ext with erythema, no warmth, no tenderness.   Psychiatric: She has a normal mood and affect. Her behavior is normal.          Assessment & Plan:

## 2010-08-24 NOTE — Telephone Encounter (Signed)
Her insurance did not need this to be preauthorized as it was outpt. I LM on her home phone about the appt. I asked that she call me back

## 2010-08-25 ENCOUNTER — Other Ambulatory Visit (HOSPITAL_COMMUNITY): Payer: Commercial Managed Care - PPO

## 2010-08-25 ENCOUNTER — Ambulatory Visit (HOSPITAL_COMMUNITY)
Admission: RE | Admit: 2010-08-25 | Discharge: 2010-08-25 | Disposition: A | Discharge status: Home / Self Care | Payer: Commercial Managed Care - PPO | Source: Ambulatory Visit | Attending: Internal Medicine | Admitting: Internal Medicine

## 2010-08-25 ENCOUNTER — Encounter: Payer: Self-pay | Admitting: Vascular Surgery

## 2010-08-25 DIAGNOSIS — IMO0002 Reserved for concepts with insufficient information to code with codable children: Secondary | ICD-10-CM | POA: Insufficient documentation

## 2010-08-25 DIAGNOSIS — L02619 Cutaneous abscess of unspecified foot: Secondary | ICD-10-CM | POA: Insufficient documentation

## 2010-08-25 DIAGNOSIS — R609 Edema, unspecified: Secondary | ICD-10-CM | POA: Insufficient documentation

## 2010-08-25 DIAGNOSIS — M79609 Pain in unspecified limb: Secondary | ICD-10-CM | POA: Insufficient documentation

## 2010-08-25 DIAGNOSIS — L03119 Cellulitis of unspecified part of limb: Secondary | ICD-10-CM | POA: Insufficient documentation

## 2010-08-25 DIAGNOSIS — M1710 Unilateral primary osteoarthritis, unspecified knee: Secondary | ICD-10-CM | POA: Insufficient documentation

## 2010-08-25 MED ORDER — IOHEXOL 300 MG/ML  SOLN
80.0000 mL | Freq: Once | INTRAMUSCULAR | Status: AC | PRN
  Administered 2010-08-25: 80 mL via INTRAVENOUS

## 2010-09-06 ENCOUNTER — Other Ambulatory Visit: Payer: Commercial Managed Care - PPO | Admitting: Vascular Surgery

## 2010-09-11 ENCOUNTER — Telehealth: Payer: Self-pay | Admitting: *Deleted

## 2010-09-11 NOTE — Telephone Encounter (Signed)
States she has an appt here Thursday to see Dr. Luciana Axe. She now has worsening abcesses with pus draining. No other md available until then. Does not want to use ED. She is going to see if she can get in to see her pcp.

## 2010-09-13 ENCOUNTER — Ambulatory Visit: Payer: Commercial Managed Care - PPO | Admitting: Vascular Surgery

## 2010-09-15 ENCOUNTER — Ambulatory Visit (INDEPENDENT_AMBULATORY_CARE_PROVIDER_SITE_OTHER): Payer: Commercial Managed Care - PPO | Admitting: Internal Medicine

## 2010-09-15 ENCOUNTER — Encounter: Payer: Self-pay | Admitting: Internal Medicine

## 2010-09-15 ENCOUNTER — Telehealth: Payer: Self-pay | Admitting: *Deleted

## 2010-09-15 VITALS — BP 130/83 | HR 68 | Temp 98.2°F | Ht 67.0 in | Wt 260.0 lb

## 2010-09-15 DIAGNOSIS — L0291 Cutaneous abscess, unspecified: Secondary | ICD-10-CM

## 2010-09-15 DIAGNOSIS — L0390 Cellulitis, unspecified: Secondary | ICD-10-CM

## 2010-09-15 MED ORDER — CLINDAMYCIN PALMITATE HCL 75 MG/5ML PO SOLR: 300.0000 mg | Freq: Four times a day (QID) | ORAL | Status: DC

## 2010-09-15 NOTE — Telephone Encounter (Signed)
[facility] called to let Dr. Luciana Axe know that the 30-day supply of Clindamycin liquid for the pt would need to be prepared in 2 batches due to the medication stability only being 14 days.  The cost of the medication was also shared @ $2700.  Dr. Luciana Axe informed of this information.  Dr. Luciana Axe asked that the pt be contacted to find out where she has previously obtained the Clindamycin and if she knew the cost.  RN left message for the pt asking her to call the Center back to discuss these issues. Jennet Maduro, RN

## 2010-09-15 NOTE — Progress Notes (Signed)
  Subjective:    Patient ID: MIHIKA SURRETTE, female    DOB: [birth year], 50 y.o.   MRN: [id number]  HPI follow up for difficult to control cellultis of leg.  She had started on clindamycin in June and has had very slow improvement.  She did have a lesion on her shin that pus was expressed out of 1 week ago, but has dried up since.  She has had no fever, no diarrhea or GI discomfort.      Review of Systems  Constitutional: Negative.   HENT: Negative.   All other systems reviewed and are negative.       Objective:   Physical Exam  Constitutional: She is oriented to person, place, and time. She appears well-developed and well-nourished.  Cardiovascular: Normal rate, regular rhythm and normal heart sounds.   No murmur heard. Pulmonary/Chest: Effort normal and breath sounds normal. No respiratory distress.  Abdominal: Soft. Bowel sounds are normal. She exhibits no distension.  Neurological: She is alert and oriented to person, place, and time.  Skin:       Left lower leg with minimal erythema, a 5 mm lesion with some erythema but no warmth.  Some clear fluid expressed.   Psychiatric: She has a normal mood and affect. Her behavior is normal.          Assessment & Plan:

## 2010-09-15 NOTE — Assessment & Plan Note (Signed)
It has almost resolved though has been slow progress.  I suspect this is secondary to a poor vascular system.  I will have her continue on the clindamycin for now and if it resolves before her next vist, clindamycin will b stopped.

## 2010-10-06 ENCOUNTER — Emergency Department (HOSPITAL_COMMUNITY): Payer: 59

## 2010-10-06 ENCOUNTER — Emergency Department (HOSPITAL_COMMUNITY)
Admission: EM | Admit: 2010-10-06 | Discharge: 2010-10-06 | Disposition: A | Discharge status: Home / Self Care | Payer: 59 | Attending: Emergency Medicine | Admitting: Emergency Medicine

## 2010-10-06 DIAGNOSIS — R112 Nausea with vomiting, unspecified: Secondary | ICD-10-CM

## 2010-10-06 DIAGNOSIS — R109 Unspecified abdominal pain: Secondary | ICD-10-CM | POA: Insufficient documentation

## 2010-10-06 DIAGNOSIS — K9509 Other complications of gastric band procedure: Secondary | ICD-10-CM | POA: Insufficient documentation

## 2010-10-06 DIAGNOSIS — Y838 Other surgical procedures as the cause of abnormal reaction of the patient, or of later complication, without mention of misadventure at the time of the procedure: Secondary | ICD-10-CM | POA: Insufficient documentation

## 2010-10-06 DIAGNOSIS — Z4651 Encounter for fitting and adjustment of gastric lap band: Secondary | ICD-10-CM

## 2010-10-06 LAB — DIFFERENTIAL
Lymphs Abs: 2.1 10*3/uL (ref 0.7–4.0)
Monocytes Absolute: 0.4 10*3/uL (ref 0.1–1.0)
Monocytes Relative: 5 % (ref 3–12)
Neutro Abs: 4.7 10*3/uL (ref 1.7–7.7)
Neutrophils Relative %: 65 % (ref 43–77)

## 2010-10-06 LAB — CBC
Hemoglobin: 14.6 g/dL (ref 12.0–15.0)
MCH: 29.6 pg (ref 26.0–34.0)
MCV: 89.7 fL (ref 78.0–100.0)
RBC: 4.94 MIL/uL (ref 3.87–5.11)

## 2010-10-20 ENCOUNTER — Telehealth: Payer: Self-pay | Admitting: Vascular Surgery

## 2010-10-20 NOTE — Telephone Encounter (Signed)
PATIENT CALLED 10-20-10 TO LET YOU KNOW SHE IS READY TO SCHEDULE ANYTIME BETWEEN NOW AND OCT. 31.  BEST NUMBER TO REACH PATIENT AT IS [phone].

## 2010-10-26 ENCOUNTER — Other Ambulatory Visit: Payer: Self-pay | Admitting: *Deleted

## 2010-10-26 DIAGNOSIS — I83893 Varicose veins of bilateral lower extremities with other complications: Secondary | ICD-10-CM

## 2010-10-27 ENCOUNTER — Encounter (INDEPENDENT_AMBULATORY_CARE_PROVIDER_SITE_OTHER): Payer: Self-pay

## 2010-10-27 ENCOUNTER — Ambulatory Visit (INDEPENDENT_AMBULATORY_CARE_PROVIDER_SITE_OTHER): Payer: Commercial Managed Care - PPO | Admitting: Physician Assistant

## 2010-10-27 VITALS — BP 126/80 | HR 58 | Temp 96.7°F | Resp 16 | Ht 67.0 in | Wt 261.2 lb

## 2010-10-27 DIAGNOSIS — Z4651 Encounter for fitting and adjustment of gastric lap band: Secondary | ICD-10-CM

## 2010-10-27 NOTE — Patient Instructions (Signed)
Take clear liquids for the next 48 hours. Thin protein shakes are ok to start on Saturday evening. Call us if you have persistent vomiting or regurgitation, night cough or reflux symptoms. Return as scheduled or sooner if you notice no changes in hunger/portion sizes.   

## 2010-10-27 NOTE — Progress Notes (Signed)
  HISTORY: [name] is a 50 y.o.female who received an AP-Large lap-band in November 2010 by Dr. Daphine Deutscher. She was seen in the ED at [facility] two weeks ago for what she believes to be food poisoning. She said Dr. Johna Sheriff removed 9 mL of fluid as she was having intractable vomiting and diarrhea. Since then she's had no issues and prior to her illness she had lost 5 lbs.  VITAL SIGNS: Filed Vitals:   10/27/10 0847  BP: 126/80  Pulse: 58  Temp: 96.7 F (35.9 C)  Resp: 16    PHYSICAL EXAM: Physical exam reveals a very well-appearing 50 y.o.female in no apparent distress Neurologic: Awake, alert, oriented Psych: Bright affect, conversant Respiratory: Breathing even and unlabored. No stridor or wheezing Abdomen: Soft, nontender, nondistended to palpation. Incisions well-healed. No incisional hernias. Port easily palpated. Extremities: Atraumatic, good range of motion.  ASSESMENT: 50 y.o.  female  s/p AP-Large lap-band.   PLAN: The patient's port was accessed with a 20G Huber needle without difficulty. Clear fluid was aspirated and 9 mL saline was added to the port. She had difficulty swallowing water so a dynamic fill technique was used. She had no dysphagia of liquid at a total fill volume of 7 mL so we left the band at that level. The patient was able to swallow water without difficulty following the procedure and was instructed to take clear liquids for the next 24-48 hours and advance slowly as tolerated.

## 2010-11-17 ENCOUNTER — Ambulatory Visit (INDEPENDENT_AMBULATORY_CARE_PROVIDER_SITE_OTHER): Payer: Commercial Managed Care - PPO | Admitting: Physician Assistant

## 2010-11-17 ENCOUNTER — Encounter (INDEPENDENT_AMBULATORY_CARE_PROVIDER_SITE_OTHER): Payer: Self-pay | Admitting: Physician Assistant

## 2010-11-17 VITALS — BP 132/80 | HR 64 | Temp 97.2°F | Resp 14 | Ht 67.0 in | Wt 260.8 lb

## 2010-11-17 DIAGNOSIS — Z4651 Encounter for fitting and adjustment of gastric lap band: Secondary | ICD-10-CM

## 2010-11-17 NOTE — Patient Instructions (Signed)
Take clear liquids for the next 48 hours. Thin protein shakes are ok to start on Saturday evening. Call us if you have persistent vomiting or regurgitation, night cough or reflux symptoms. Return as scheduled or sooner if you notice no changes in hunger/portion sizes.   

## 2010-11-17 NOTE — Progress Notes (Signed)
  HISTORY: [name] is a 50 y.o.female who received an AP-Large lap-band in November 2010 by Dr. Daphine Deutscher. She had the band emptied in early September in the ED then saw me last month to get back on track. She's been at 7mL since her last visit and is having persistent hunger, as expected (fluid removed was 9 mL).  VITAL SIGNS: Filed Vitals:   11/17/10 0948  BP: 132/80  Pulse: 64  Temp: 97.2 F (36.2 C)  Resp: 14    PHYSICAL EXAM: Physical exam reveals a very well-appearing 50 y.o.female in no apparent distress Neurologic: Awake, alert, oriented Psych: Bright affect, conversant Respiratory: Breathing even and unlabored. No stridor or wheezing Abdomen: Soft, nontender, nondistended to palpation. Incisions well-healed. No incisional hernias. Port easily palpated. Extremities: Atraumatic, good range of motion.  ASSESMENT: 50 y.o.  female  s/p AP-Large lap-band.   PLAN: The patient's port was accessed with a 20G Huber needle without difficulty. Clear fluid was aspirated and 1 mL saline was added to the port to give a total predicted volume of 8 mL. The patient was able to swallow water without difficulty following the procedure and was instructed to take clear liquids for the next 24-48 hours and advance slowly as tolerated.

## 2010-11-29 ENCOUNTER — Telehealth (INDEPENDENT_AMBULATORY_CARE_PROVIDER_SITE_OTHER): Payer: Self-pay | Admitting: General Surgery

## 2010-11-29 NOTE — Telephone Encounter (Signed)
PT CALLED EARLIER THIS AM  STATING THAT SHE HAD EATEN 3 GRAPES YESTERDAY AND SINCE THEN SHE HAS NOT BEEN ABLE TO SWALLOW SOLIDS OR LIQUIDS WITHOUT THROWING UP. DR. Daphine Deutscher PAGED RE THIS FOR DIRECTION. PT ALSO HAS APPT WITH DR. Sheron Nightingale TOMORROW MORNING.

## 2010-11-30 ENCOUNTER — Other Ambulatory Visit (HOSPITAL_COMMUNITY): Payer: Commercial Managed Care - PPO

## 2010-11-30 ENCOUNTER — Telehealth (INDEPENDENT_AMBULATORY_CARE_PROVIDER_SITE_OTHER): Payer: Self-pay

## 2010-11-30 ENCOUNTER — Emergency Department (HOSPITAL_COMMUNITY): Payer: 59

## 2010-11-30 ENCOUNTER — Ambulatory Visit (INDEPENDENT_AMBULATORY_CARE_PROVIDER_SITE_OTHER): Payer: Commercial Managed Care - PPO | Admitting: Surgery

## 2010-11-30 ENCOUNTER — Encounter (INDEPENDENT_AMBULATORY_CARE_PROVIDER_SITE_OTHER): Payer: Self-pay | Admitting: Surgery

## 2010-11-30 ENCOUNTER — Emergency Department (HOSPITAL_COMMUNITY)
Admission: EM | Admit: 2010-11-30 | Discharge: 2010-11-30 | Disposition: A | Discharge status: Home / Self Care | Payer: 59 | Attending: Emergency Medicine | Admitting: Emergency Medicine

## 2010-11-30 VITALS — BP 148/82 | HR 60 | Temp 96.9°F | Resp 16 | Ht 66.0 in | Wt 254.4 lb

## 2010-11-30 DIAGNOSIS — Z9884 Bariatric surgery status: Secondary | ICD-10-CM

## 2010-11-30 DIAGNOSIS — R1012 Left upper quadrant pain: Secondary | ICD-10-CM | POA: Insufficient documentation

## 2010-11-30 LAB — POCT I-STAT, CHEM 8
BUN: 17 mg/dL (ref 6–23)
Calcium, Ion: 1.15 mmol/L (ref 1.12–1.32)
Chloride: 104 mEq/L (ref 96–112)
Creatinine, Ser: 0.8 mg/dL (ref 0.50–1.10)
Glucose, Bld: 81 mg/dL (ref 70–99)

## 2010-11-30 LAB — URINE MICROSCOPIC-ADD ON

## 2010-11-30 LAB — URINALYSIS, ROUTINE W REFLEX MICROSCOPIC
Ketones, ur: >80 mg/dL — AB
Nitrite: NEGATIVE
pH: 5 (ref 5.0–8.0)

## 2010-11-30 LAB — DIFFERENTIAL
Eosinophils Absolute: 0.1 10*3/uL (ref 0.0–0.7)
Eosinophils Relative: 1 % (ref 0–5)
Lymphs Abs: 1.5 10*3/uL (ref 0.7–4.0)
Monocytes Absolute: 0.6 10*3/uL (ref 0.1–1.0)
Monocytes Relative: 7 % (ref 3–12)

## 2010-11-30 LAB — CBC
MCH: 30.1 pg (ref 26.0–34.0)
MCV: 91.4 fL (ref 78.0–100.0)
Platelets: 170 10*3/uL (ref 150–400)
RBC: 5.32 MIL/uL — ABNORMAL HIGH (ref 3.87–5.11)

## 2010-11-30 NOTE — Patient Instructions (Signed)

## 2010-11-30 NOTE — Progress Notes (Signed)
[name] comes in today having eaten a grape and got  Obstructed.  Today I removed 5 cc from her band.  Will see her back in 1 week to add fluid back to her band.  She is in the middle of EPIC transformation.

## 2010-11-30 NOTE — Telephone Encounter (Signed)
Patient was just seen today and had fluid removed from her band.  She is now having an unbearable pain on her left side at her stomach.  She is taking in liquids ok and having diarrhea.  She has no vomiting since the fluid was removed.  I paged Dr Daphine Deutscher who advised we need to order a ct with oral contrast.  I notified the pt I will set her up and call her back.  She is allergic to iv contrast and is not diabetic.

## 2010-12-01 ENCOUNTER — Encounter (INDEPENDENT_AMBULATORY_CARE_PROVIDER_SITE_OTHER): Payer: Self-pay

## 2010-12-01 ENCOUNTER — Ambulatory Visit (INDEPENDENT_AMBULATORY_CARE_PROVIDER_SITE_OTHER): Payer: 59 | Admitting: Physician Assistant

## 2010-12-01 ENCOUNTER — Telehealth (INDEPENDENT_AMBULATORY_CARE_PROVIDER_SITE_OTHER): Payer: Self-pay

## 2010-12-01 ENCOUNTER — Encounter (INDEPENDENT_AMBULATORY_CARE_PROVIDER_SITE_OTHER): Payer: Commercial Managed Care - PPO

## 2010-12-01 ENCOUNTER — Inpatient Hospital Stay (HOSPITAL_COMMUNITY)
Admission: AD | Admit: 2010-12-01 | Discharge: 2010-12-03 | DRG: 337 | Disposition: A | Discharge status: Home / Self Care | Payer: 59 | Source: Ambulatory Visit | Attending: Surgery | Admitting: Surgery

## 2010-12-01 VITALS — BP 156/84 | HR 66 | Temp 97.0°F | Resp 16 | Ht 67.0 in | Wt 255.5 lb

## 2010-12-01 DIAGNOSIS — R1013 Epigastric pain: Secondary | ICD-10-CM

## 2010-12-01 DIAGNOSIS — R1110 Vomiting, unspecified: Secondary | ICD-10-CM

## 2010-12-01 DIAGNOSIS — T85691A Other mechanical complication of intraperitoneal dialysis catheter, initial encounter: Secondary | ICD-10-CM

## 2010-12-01 DIAGNOSIS — K660 Peritoneal adhesions (postprocedural) (postinfection): Secondary | ICD-10-CM | POA: Diagnosis present

## 2010-12-01 DIAGNOSIS — Z4651 Encounter for fitting and adjustment of gastric lap band: Secondary | ICD-10-CM

## 2010-12-01 DIAGNOSIS — E119 Type 2 diabetes mellitus without complications: Secondary | ICD-10-CM | POA: Diagnosis present

## 2010-12-01 DIAGNOSIS — Z96659 Presence of unspecified artificial knee joint: Secondary | ICD-10-CM

## 2010-12-01 DIAGNOSIS — K311 Adult hypertrophic pyloric stenosis: Secondary | ICD-10-CM

## 2010-12-01 DIAGNOSIS — Y838 Other surgical procedures as the cause of abnormal reaction of the patient, or of later complication, without mention of misadventure at the time of the procedure: Secondary | ICD-10-CM | POA: Diagnosis present

## 2010-12-01 DIAGNOSIS — K9509 Other complications of gastric band procedure: Principal | ICD-10-CM | POA: Diagnosis present

## 2010-12-01 LAB — COMPREHENSIVE METABOLIC PANEL
AST: 14 U/L (ref 0–37)
CO2: 29 mEq/L (ref 19–32)
Chloride: 101 mEq/L (ref 96–112)
Glucose, Bld: 89 mg/dL (ref 70–99)
Potassium: 3.8 mEq/L (ref 3.5–5.1)
Sodium: 140 mEq/L (ref 135–145)
Total Bilirubin: 1.1 mg/dL (ref 0.3–1.2)

## 2010-12-01 LAB — CBC
Hemoglobin: 15.3 g/dL — ABNORMAL HIGH (ref 12.0–15.0)
RBC: 5.12 MIL/uL — ABNORMAL HIGH (ref 3.87–5.11)
WBC: 8.7 10*3/uL (ref 4.0–10.5)

## 2010-12-01 LAB — DIFFERENTIAL
Basophils Absolute: 0 10*3/uL (ref 0.0–0.1)
Basophils Relative: 0 % (ref 0–1)
Monocytes Relative: 9 % (ref 3–12)
Neutro Abs: 6.7 10*3/uL (ref 1.7–7.7)
Neutrophils Relative %: 77 % (ref 43–77)

## 2010-12-01 NOTE — Telephone Encounter (Signed)
Reviewed chart and ct with Dr Ezzard Standing. Per Dr Ezzard Standing pt given appt for today to see PA and Dr Ezzard Standing. Pt states she is fine with this and will be here by 1:00pm.

## 2010-12-01 NOTE — Telephone Encounter (Signed)
Pt called stating she went to ER last pm. States no better with taking po's and request to be seen today. Will have Dr Ezzard Standing review and advise.

## 2010-12-01 NOTE — Progress Notes (Signed)
HISTORY: [name] is a 50 y.o.female who received an AP-Large lap-band in January 04, 2009 by Dr. Daphine Deutscher.  She is a patient of  CAMERON,JOHN, MD of [facility] in [place].  She has had a rough week. Her symptoms began when she ate some grapes on Tuesday, 1023.  She began having regurgitation symptoms which were addressed by Dr. Daphine Deutscher on 11/29/2010 by removing 5mL of fluid from her band. Unfortunately, while it appeared that she was able to tolerate liquids in the office, she began having vomiting after returning home. She was seen in the ED yesterday (10/25) and a CT was performed which described a "normal lap band". She received IV fluids and had labs drawn.   She contacted our office this morning and has continued regurgitation symptoms and epigastric pain.  I reviewed the CT scan and actually there is a significant anterior slip on the films.  I arranged for the patient to be seen by Donalee Citrin for fluid removal and evaluation.  No current outpatient prescriptions on file.   Past Medical History  Diagnosis Date  . Diabetes mellitus   . Leg swelling   . Difficulty in swallowing    Past Surgical History  Procedure Date  . Laparoscopic gastric banding 01-05-2008  . Endovenous ablation saphenous vein w/ laser 06-2008  . Joint replacement   . Cholecystectomy   . Salphingooophorectomy   . Wedge resection of ovary LEFT OVARY   . Carpel tunnel bilatereal     Allergies  Allergen Reactions  . Contrast Media (Iodinated Diagnostic Agents)     apnea  . Penicillins Other (See Comments)    Patient does not recall the reaction, it happened when she was a child     VITAL SIGNS: BP 156/84  Pulse 66  Temp(Src) 97 F (36.1 C) (Temporal)  Resp 16  Ht 5\' 7"  (1.702 m)  Wt 255 lb 8 oz (115.894 kg)  BMI 40.02 kg/m2   PHYSICAL EXAM: Physical exam reveals an ill-appearing 50 y.o.female in mild to moderate distress. She was seen and examined with Dr. Ezzard Standing. Neurologic: Awake,  alert, oriented Psych: Bright affect, conversant Respiratory: Breathing even and unlabored. No stridor or wheezing Abdomen: Soft, nontender, nondistended to palpation. Incisions well-healed. No incisional hernias. Port easily palpated. Skin: Warm, dry, poor turgor Extremities: Atraumatic, good range of motion.  ASSESMENT: 1.   s/p AP-Large lap-band, now  with evidence of anterior gastric slip.   2.  DM, resolved.  3.  Succesful weight loss of about 100 pounds  PLAN: The patient's port was accessed with a 20G Huber needle without difficulty. Clear fluid was aspirated and 3.0 mL saline was removed from the port to give a total predicted volume of 0 mL. She was given sips of water which she didn't tolerate - she had to immediately vomit.   The CT was reviewed with Dr. Ezzard Standing. There is a dramatic anterior gastric prolapse with retained contrast. Band is rotated 90 degrees in the coronal plane. The images were shared with the patient. As she has evidence of dehydration (ketonuria and increased hematocrit) on her ED labs and is intolerant of PO fluids, she will be admitted to [facility] under Dr. Allene Pyo service upon her discharge from clinic today for IV hydration, antiemetics and pain control. She is currently scheduled for diagnostic laparoscopy with repair of the anterior gastric prolapse by Drs. Daphine Deutscher and [facility] on 12/04/2010. The possibility of band removal was discussed with the patient and she voices  understanding.

## 2010-12-02 ENCOUNTER — Inpatient Hospital Stay (HOSPITAL_COMMUNITY): Payer: 59

## 2010-12-03 ENCOUNTER — Inpatient Hospital Stay (HOSPITAL_COMMUNITY): Payer: 59

## 2010-12-03 LAB — CBC
HCT: 44.2 % (ref 36.0–46.0)
Hemoglobin: 14.7 g/dL (ref 12.0–15.0)
MCH: 29.9 pg (ref 26.0–34.0)
RBC: 4.91 MIL/uL (ref 3.87–5.11)

## 2010-12-03 LAB — DIFFERENTIAL
Basophils Relative: 0 % (ref 0–1)
Lymphocytes Relative: 10 % — ABNORMAL LOW (ref 12–46)
Lymphs Abs: 0.5 10*3/uL — ABNORMAL LOW (ref 0.7–4.0)
Monocytes Relative: 10 % (ref 3–12)
Neutro Abs: 4.5 10*3/uL (ref 1.7–7.7)
Neutrophils Relative %: 81 % — ABNORMAL HIGH (ref 43–77)

## 2010-12-04 ENCOUNTER — Inpatient Hospital Stay (HOSPITAL_COMMUNITY): Admission: RE | Admit: 2010-12-04 | Payer: 59 | Source: Ambulatory Visit | Admitting: Surgery

## 2010-12-04 NOTE — Op Note (Addendum)
[name], [name] NO.:  [id number]  MEDICAL RECORD NO.:  [id number]  LOCATION:  1S                           FACILITY:  [facility]  PHYSICIAN:  Sandria Bales. Ezzard Standing, M.D.  DATE OF BIRTH:  08-31-1960  DATE OF PROCEDURE:  12/02/2010                              OPERATIVE REPORT  PREOPERATIVE DIAGNOSIS:  Anterior slip of lap-band.  POSTOPERATIVE DIAGNOSES:  Anterior slip of lap-band, omental adhesions in central abdomen wall under umbilicus.  PROCEDURE:  Laparoscopy, lysis of adhesions and repair of slipped lap-band.  SURGEON:  Sandria Bales. Ezzard Standing, M.D.  FIRST ASSISTANT:  Sharlet Salina T. Hoxworth, M.D.  ANESTHESIA:  General endotracheal.  ESTIMATED BLOOD LOSS:  Minimal.  INDICATION FOR PROCEDURE:  Ms. [name] is a 51 year old white female, who sees Dr. Desmond Dike of [facility] as her primary medical doctor.  She had an AP large band, placed by Dr. Luretha Murphy, on January 04, 2009.  She has had a successful weight loss of over 100 pounds.  This past week, however, she developed significant nausea and vomiting.  Dr. Daphine Deutscher removed fluid from her lap band, but she continued to have nausea and vomiting.  A CT scan suggested that she had an anterior slip of her band.  She was admitted to [facility] yesterday and hydrated overnight.  Her symptoms are better, but not completely resolved, and I think she would be best taken to the operating room for revision of her lap-band and repair of the anterior slip today.  The indications and potential complications of the surgery were explained to the patient.  Potential complications include, but are not limited to, bleeding, infection, possible erosion of the band, and need to remove the band.  OPERATIVE NOTE:  Patient placed in a supine position, given a general endotracheal anesthetic as supervised by Dr. Sherrian Divers in room number #1.  She was given 2 g of Ancef at the initiation of  procedure. Abdomen was prepped with ChloraPrep and sterilely draped.    A time-out was held and surgical checklist run.  I tried to access her abdominal cavity to the left upper quadrant using a 5 mm Optiview, but her abdominal wall was so elastic that I could not get the trocar to go in safely, so therefore, I cut down at the umbilicus and used a 5 mm Hasson trocar and got to the abdominal cavity. Unfortunately, her omentum was stuck to the anterior abdominal wall, so I went through her omentum and got under the transverse colon.  I was then able to look around the omentum and place a trocar in the left upper quadrant.  I placed a total of 6 trocars; placed a 5 mm in the subxiphoid location for the liver retractor, a 5 mm right subcostal from the left hand, a 10 mm right paramedian for the right hand.  We used the umbilical port for the scope.  I placed a 5 mm left upper quadrant and 5mm left lateral trocar.  I did enlarge the right paramedian incision to a 10 mm trocar because I needed this to put the needles in during the procedure.The liver was retracted after taking the omentum down. She  had adhesions of omentum to the anterior abdominal wall.   I did not get the entire omentum, but I cleared also under the umbilicus.  Her right and left lobes of liver were unremarkable.  The small bowel that I could see was unremarkable, but she did have a generous slip of probably over half her stomach above the band, and the stomach looked to beat up, but viable.  I identified the buckle of the band and buckled it.  I was then able to pull all the stomach down below the band.  It looked like though it was more medial suture, but the imbrication gave way all areas push lateral. I did not think I needed to resit the band, but I could just reimbricate over the band medially.  An NG tube was passed to decompress the stomach.  The stomach, otherwise, looked viable though beat up.  I first closed the  band down, I closed easily.  I then used 3 sutures of 2-0 Ethibond suture to imbricate the stomach over the lap band, leaving the buckle open.  Photos were taken both before the suturing and afterward sututring.  For some reason, the pictures could not be printed while I was in the room.  I then did a Renn stitch to imbricate some of the redundant stomach below the band.  NG tube got out about 500 cc.  Her band position looked good.  A photo was taken of this.  There was no bleeding from where I took the omentum down and this trocar was then removed in turn.  I closed the umbilical port with 0 Vicryl suture.  I closed the skin each site with 5-0 Vicryl suture painted.  The wound with Dermabond and sterilely dressed.    The patient tolerated the procedure well, and was transported to the recovery room in good condition.  Sponge, needle count, were correct.   Sandria Bales. Ezzard Standing, M.D., FACS    DHN/MEDQ  D:  12/02/2010  T:  12/03/2010  Job:  [id number]  cc:   Desmond Dike, MD Fax: [phone]  [place]. Daphine Deutscher, MD [address] [place] [address]  Electronically Signed by Ovidio Kin M.D. on 12/04/2010 07:27:35 AM

## 2010-12-04 NOTE — H&P (Addendum)
NAMELORIEL, DIEHL NO.:  [id number]  MEDICAL RECORD NO.:  [id number]  LOCATION:  1S                           FACILITY:  [facility]  PHYSICIAN:  Sandria Bales. Ezzard Standing, M.D.  DATE OF BIRTH:  April 17, 1960  DATE OF ADMISSION:  12/01/2010                             HISTORY & PHYSICAL  HISTORY OF PRESENT ILLNESS:  Ms. Pospisil is a 50 year old white female, who is a patient of Dr. Desmond Dike of [facility].  She has been morbidly obese, and underwent placement of an AP large lap-band by Dr. Luretha Murphy on January 04, 2009.  She has had a very successful postoperative course with loss of greater than 100 pounds.  However, this past week on Tuesday, she had eaten some grapes and got started vomiting.  She saw Dr. Daphine Deutscher on Wednesday, October 24, who removed 5 cc of fluid from her band, she had some symptomatic improvement.  However on Thursday, October 25, she had worsening symptoms, went to the [employer].  She had a CT scan performed her abdomen, which suggested that she had a normal lap-band. But because of her persistent symptoms, she contacted our office on this Friday, October 26, I reviewed her CT scan that showed she had slipped band.  She was seen in our office, remainder the fluid was removed, and she was admitted to the hospital for IV hydration.  LABS:  On admission showed a hemoglobin of 15, hematocrit 46, white blood count of 8700.  Her electrolytes showed a creatinine of 0.6.  MEDICATIONS:  She is really on no medicines right now.  ALLERGIES:  To PENICILLIN, but she had this as a child.  REVIEW OF SYSTEMS: 1. She did have a history of diabetes, but this has resolved with her     weight loss. 2. She has had some leg swelling. Her other operations besides, her gastric banding and she has had a joint replacement, cholecystectomy, salpingo-oophorectomy, and bilateral carpal tunnels.  PHYSICAL EXAMINATION:  VITAL SIGNS:   Her blood pressure was 156/84, pulse is 66, temperature 97.  Her weight is 255, BMI 40.0. GENERAL:  She was a somewhat ill-appearing white female.  She is in mild distress.  She is having some upper gastric discomfort, but does not look toxic. HEENT:  Unremarkable. NECK:  Supple.  There are no masses or thyromegaly. LUNGS:  Clear to auscultation with symmetric breath sounds. HEART:  Regular rate and rhythm.  I hear no murmur or rub. ABDOMEN:  She has a port in the right upper quadrant.  She has a vague discomfort in her epigastrium without any particular mass or lesion.RECTAL:  I did not do a rectal exam. EXTREMITIES:  She has good strength in upper and lower extremities. NEUROLOGIC:  Grossly intact.  IMPRESSION: 1. Slipped lap-band.     She has normal labs now, but does not feel good, and I      think is somewhat dehydrated.  The plan will be overnight      hydration with IV fluids, Dilaudid for pain, nausea medicine,      and reevaluation in the morning.  If she is not clearly better,  consider refixing her slipped lap-band this weekend.  She      understands the risk of surgery, which include, but are not limited to, bleeding, infection, erosion of the stomach, and also the possibility of not been able to save the band if we do surgery. 2. History of diabetes, which is resolved. 3. Morbidly obese, but has had significant weight loss with her lap-     band surgery. 4. History of prior joint replacement.   Sandria Bales. Ezzard Standing, M.D., FACS    DHN/MEDQ  D:  12/02/2010  T:  12/03/2010  Job:  [id number]  cc:   Desmond Dike, MD Fax: [phone]  Electronically Signed by Ovidio Kin M.D. on 12/04/2010 07:29:57 AM

## 2010-12-09 NOTE — Discharge Summary (Signed)
[name], [name] NO.:  [id number]  MEDICAL RECORD NO.:  [id number]  LOCATION:                               FACILITY:  [facility]  PHYSICIAN:  Sandria Bales. Ezzard Standing, M.D.  DATE OF BIRTH:  Oct 04, 1960  DATE OF ADMISSION:  12/01/2010 DATE OF DISCHARGE:  12/03/2010                              DISCHARGE SUMMARY  DISCHARGE DIAGNOSES: 1. Slipped Lap-Band. 2. Morbid obesity with history of significant weight loss with Lap-     Band. 3. History of diabetes mellitus resolved with weight loss. 4. History of prior joint replacement.  OPERATION PERFORMED:  The patient had a diagnostic laparoscopy with enterolysis of adhesions and repair of slipped Lap-Band by Dr. Ovidio Kin on December 02, 2010.  HISTORY OF PRESENT ILLNESS:  [name] is a 50 year old white female, who had an AP large band placed by Dr. Luretha Murphy on January 04, 2009.  [name] is a patient of Dr. Desmond Dike of [facility] at [place].  [name] has done very well with the Lap-Band losing over 100 pounds, and then this past Tuesday, November 28, 2010, [name] was eating some grapes when [name] developed nausea and vomiting.  [name] was seen by Dr. Daphine Deutscher on November 29, 2010, in our office.  He removed 5 cc of fluid, which gave [name] temporary relief.  However by the Thursday, October 25, [name] was having worsening pain and came to the [facility] Emergency Room.  A CT scan performed in the emergency room found nothing remarkable, but [name] had persistent symptoms.  On Friday, 26 and [name] presented to our office for further evaluation.  Review of the CT scan showed that [name] had a slipped Lap-Band, and I admitted [name] from our office on from Friday, October 26.  [name] white blood count on admission was 8700.  I gave [name] IV fluids overnight and pain medicine, but [name] was still having abdominal discomfort and felt [name] will be best served by going ahead with Roux-en- Y and finally fix this slipped Lap-Band.  So on  the Saturday, December 02, 2010, [name] was taken to the operating room, where I did a laparoscopic exploration with enterolysis of adhesions, and repair of slipped Lap-Band at [facility].  Postoperatively, [name] has done well.  [name] is taking liquids.  [name] postop x-ray showed the band in good position.  [name] is now 1 day postop and ready for discharge.  LABS:  Today, show a white blood count of 5500, hemoglobin of 14.  [name] husband is at [name] bedside.  I have reviewed [name] discharge instructions.   This included resuming [name] home medicines including Biotin, calcium, and multivitamins.  [name] should be out of work for about 1 week.   I wrote a note to be out of work till December 09, 2010.   [name] should not drive for 2 or 3 days.  [name] should stay on liquids for 3 days and restart [name] Lap-Band diet.   [name] can shower starting October 29 and [name] will see Dr. Daphine Deutscher back in 2-4 weeks for followup, knows to call for that followup.  DISCHARGE CONDITION:  Good.   Sandria Bales. Ezzard Standing, M.D., FACS  DHN/MEDQ  D:  12/03/2010  T:  12/03/2010  Job:  [id number]  cc:   Desmond Dike, MD Fax: [phone]  [place]. Daphine Deutscher, MD [address] [place] [address]  Electronically Signed by Ovidio Kin M.D. on 12/09/2010 06:27:33 AM

## 2010-12-14 ENCOUNTER — Encounter (INDEPENDENT_AMBULATORY_CARE_PROVIDER_SITE_OTHER): Payer: Self-pay

## 2010-12-15 ENCOUNTER — Ambulatory Visit (INDEPENDENT_AMBULATORY_CARE_PROVIDER_SITE_OTHER): Payer: Commercial Managed Care - PPO

## 2010-12-17 ENCOUNTER — Other Ambulatory Visit: Payer: Self-pay

## 2010-12-17 ENCOUNTER — Encounter (HOSPITAL_COMMUNITY): Payer: Self-pay | Admitting: *Deleted

## 2010-12-17 ENCOUNTER — Inpatient Hospital Stay (HOSPITAL_COMMUNITY)
Admission: EM | Admit: 2010-12-17 | Discharge: 2010-12-22 | DRG: 378 | Disposition: A | Discharge status: Home / Self Care | Payer: 59 | Attending: Internal Medicine | Admitting: Internal Medicine

## 2010-12-17 ENCOUNTER — Telehealth (INDEPENDENT_AMBULATORY_CARE_PROVIDER_SITE_OTHER): Payer: Self-pay | Admitting: General Surgery

## 2010-12-17 DIAGNOSIS — I498 Other specified cardiac arrhythmias: Secondary | ICD-10-CM | POA: Diagnosis present

## 2010-12-17 DIAGNOSIS — Z6841 Body Mass Index (BMI) 40.0 and over, adult: Secondary | ICD-10-CM

## 2010-12-17 DIAGNOSIS — E119 Type 2 diabetes mellitus without complications: Secondary | ICD-10-CM | POA: Diagnosis present

## 2010-12-17 DIAGNOSIS — K254 Chronic or unspecified gastric ulcer with hemorrhage: Principal | ICD-10-CM | POA: Diagnosis present

## 2010-12-17 DIAGNOSIS — D62 Acute posthemorrhagic anemia: Secondary | ICD-10-CM

## 2010-12-17 DIAGNOSIS — Z9884 Bariatric surgery status: Secondary | ICD-10-CM

## 2010-12-17 DIAGNOSIS — D649 Anemia, unspecified: Secondary | ICD-10-CM

## 2010-12-17 DIAGNOSIS — K922 Gastrointestinal hemorrhage, unspecified: Secondary | ICD-10-CM

## 2010-12-17 DIAGNOSIS — R031 Nonspecific low blood-pressure reading: Secondary | ICD-10-CM | POA: Diagnosis present

## 2010-12-17 HISTORY — DX: Shortness of breath: R06.02

## 2010-12-17 HISTORY — DX: Anemia, unspecified: D64.9

## 2010-12-17 HISTORY — DX: Reserved for inherently not codable concepts without codable children: IMO0001

## 2010-12-17 HISTORY — DX: Gastro-esophageal reflux disease without esophagitis: K21.9

## 2010-12-17 HISTORY — DX: Peripheral vascular disease, unspecified: I73.9

## 2010-12-17 HISTORY — DX: Encounter for other specified aftercare: Z51.89

## 2010-12-17 LAB — COMPREHENSIVE METABOLIC PANEL
ALT: 11 U/L (ref 0–35)
AST: 11 U/L (ref 0–37)
Albumin: 2.7 g/dL — ABNORMAL LOW (ref 3.5–5.2)
CO2: 26 mEq/L (ref 19–32)
Calcium: 8.3 mg/dL — ABNORMAL LOW (ref 8.4–10.5)
Creatinine, Ser: 0.46 mg/dL — ABNORMAL LOW (ref 0.50–1.10)
GFR calc non Af Amer: >90 mL/min (ref >90)
Sodium: 141 mEq/L (ref 135–145)
Total Protein: 5.1 g/dL — ABNORMAL LOW (ref 6.0–8.3)

## 2010-12-17 LAB — CBC
Hemoglobin: 8.5 g/dL — ABNORMAL LOW (ref 12.0–15.0)
MCH: 30.6 pg (ref 26.0–34.0)
MCHC: 33.7 g/dL (ref 30.0–36.0)
Platelets: 218 10*3/uL (ref 150–400)
RDW: 15.5 % (ref 11.5–15.5)

## 2010-12-17 LAB — APTT: aPTT: 28 seconds (ref 24–37)

## 2010-12-17 LAB — DIFFERENTIAL
Basophils Absolute: 0.1 10*3/uL (ref 0.0–0.1)
Basophils Relative: 1 % (ref 0–1)
Eosinophils Absolute: 0.2 10*3/uL (ref 0.0–0.7)
Lymphocytes Relative: 29 % (ref 12–46)
Monocytes Absolute: 0.4 10*3/uL (ref 0.1–1.0)
Neutrophils Relative %: 63 % (ref 43–77)

## 2010-12-17 LAB — PROTIME-INR: INR: 1.17 (ref 0.00–1.49)

## 2010-12-17 MED ORDER — SODIUM CHLORIDE 0.9 % IV BOLUS (SEPSIS)
1000.0000 mL | Freq: Once | INTRAVENOUS | Status: AC
  Administered 2010-12-17: 1000 mL via INTRAVENOUS

## 2010-12-17 NOTE — ED Notes (Signed)
Pt c/o dizziness, starting past Thurs, worsening gradually. Pt unable to stand w/o extreme dizziness and an episode of syncope where she lost consciousness for unknown period of time, waking on the floor of her home at 1100 today.

## 2010-12-17 NOTE — ED Notes (Signed)
Pt states that, after working last night until 0700, she got up to use the bathroom after which she found herself to be dizzy and unstable on her feet.  Pt then recalls waking up from lying on the floor.  Pt reports feeling slightly faint and dizzy x4 days.  Pt noted feeling dehydrated and has been increasing her fluid intake.  Pt also reports black tarry stool.

## 2010-12-17 NOTE — Telephone Encounter (Signed)
Patient called saying that she was feeling weak and fainting when she ambulates.  She also said that she was having black stools and looking pale.  I recommended that she go to the ER for labs and evaluation.

## 2010-12-17 NOTE — ED Provider Notes (Signed)
History     CSN: [id number] Arrival date & time: 12/17/2010  8:20 PM   First MD Initiated Contact with Patient 12/17/10 2112      Chief Complaint  Patient presents with  . Loss of Consciousness  . Shortness of Breath  . Tachycardia  . Dizziness    (Consider location/radiation/quality/duration/timing/severity/associated sxs/prior treatment) Patient is a 50 y.o. female presenting with syncope and shortness of breath. The history is provided by the patient.  Loss of Consciousness This is a new problem. The current episode started more than 2 days ago. The problem occurs constantly. The problem has been gradually worsening. Associated symptoms include shortness of breath. Pertinent negatives include no chest pain. Associated symptoms comments: palpatations and lightheadedness. The symptoms are aggravated by standing and walking. The symptoms are relieved by nothing. She has tried nothing for the symptoms.  Shortness of Breath  The current episode started 2 days ago. The problem occurs occasionally. The problem has been unchanged. The problem is mild. The symptoms are relieved by rest. The symptoms are aggravated by activity. Associated symptoms include shortness of breath. Pertinent negatives include no chest pain. Her past medical history does not include asthma. There were no sick contacts. Recently, medical care has been given by a specialist.    Past Medical History  Diagnosis Date  . Diabetes mellitus   . Leg swelling   . Difficulty in swallowing   . Arthritis   . Bruises easily   . Reflux     Past Surgical History  Procedure Date  . Laparoscopic gastric banding 01-05-2008  . Endovenous ablation saphenous vein w/ laser 06-2008  . Cholecystectomy   . Salphingooophorectomy   . Wedge resection of ovary LEFT OVARY   . Carpel tunnel bilatereal     Family History  Problem Relation Age of Onset  . Heart disease Father   . Kidney disease Father   . Peripheral vascular  disease Father   . Diabetes Mother   . Arthritis Mother     History  Substance Use Topics  . Smoking status: Never Smoker   . Smokeless tobacco: Never Used  . Alcohol Use: No    OB History    Grav Para Term Preterm Abortions TAB SAB Ect Mult Living                  Review of Systems  Respiratory: Positive for shortness of breath.   Cardiovascular: Positive for syncope. Negative for chest pain.  Neurological: Positive for syncope and light-headedness.       States has been lightheaded for several days and today stood up to fast and passed out but denies pain from syncope  All other systems reviewed and are negative.    Allergies  Ancef; Contrast media; and Penicillins  Home Medications   Current Outpatient Rx  Name Route Sig Dispense Refill  . CALCIUM 1000 + D PO Oral Take 1,000 mg by mouth daily.      Carma Leaven M PLUS PO TABS Oral Take 1 tablet by mouth daily.      Marland Kitchen CLINDAMYCIN HCL PO Oral Take 750 mg by mouth 4 (four) times daily - after meals and at bedtime.        BP 91/53  Pulse 124  Temp 97.8 F (36.6 C)  Resp 20  SpO2 100%  Physical Exam  Nursing note and vitals reviewed. Constitutional: She is oriented to person, place, and time. She appears well-developed and well-nourished. No distress.  HENT:  Head:  Normocephalic and atraumatic.  Eyes: EOM are normal. Pupils are equal, round, and reactive to light.       Conjunctiva pale  Cardiovascular: Regular rhythm, normal heart sounds and intact distal pulses.  Tachycardia present.  Exam reveals no friction rub.   No murmur heard. Pulmonary/Chest: Effort normal and breath sounds normal. She has no wheezes. She has no rales.  Abdominal: Soft. Bowel sounds are normal. She exhibits no distension. There is no tenderness. There is no rebound and no guarding.       Rectal exam with black stool  Musculoskeletal: Normal range of motion. She exhibits no tenderness.       No edema  Neurological: She is alert and  oriented to person, place, and time.  Skin: Skin is warm and dry. No rash noted. There is pallor.  Psychiatric: She has a normal mood and affect. Her behavior is normal.    ED Course  Procedures (including critical care time)   Labs Reviewed  OCCULT BLOOD, POC DEVICE  POCT CBG (FASTING - GLUCOSE)-MANUAL ENTRY  CBC  DIFFERENTIAL  COMPREHENSIVE METABOLIC PANEL  POCT OCCULT BLOOD STOOL, DEVICE  PROTIME-INR  APTT  TYPE AND SCREEN   No results found.  Date: 12/17/2010  Rate: 117  Rhythm: sinus tachycardia  QRS Axis: normal  Intervals: normal  ST/T Wave abnormalities: normal  Conduction Disutrbances:none  Narrative Interpretation:   Old EKG Reviewed: unchanged  CRITICAL CARE Performed by: Gwyneth Sprout   Total critical care time: 30  Critical care time was exclusive of separately billable procedures and treating other patients.  Critical care was necessary to treat or prevent imminent or life-threatening deterioration.  Critical care was time spent personally by me on the following activities: development of treatment plan with patient and/or surrogate as well as nursing, discussions with consultants, evaluation of patient's response to treatment, examination of patient, obtaining history from patient or surrogate, ordering and performing treatments and interventions, ordering and review of laboratory studies, ordering and review of radiographic studies, pulse oximetry and re-evaluation of patient's condition.   1. Anemia   2. GI bleed       MDM   Pt with dizziness and syncope today which was orthostatic.  Pt is orthostatic on exam and tachycardic.  Recent black stools and denies CP but SOB.  Pt had lapband revised 3 weeks ago but states no abd pain and no hx of GI bleed.  No anticoagulants and states feels ok while sitting.  EKG with sinus tachy.  Will get CBC, CMP, hemoccult positive and t&s.  Pt with anemia of 8.4 and rest of labs wnl.  PRBC's ordered and  transfused.  Pt admitted for further care.       Gwyneth Sprout, MD 12/18/10 [phone]

## 2010-12-17 NOTE — ED Notes (Signed)
Pt taken to restroom via wheelchair.  Urine specimen obtained.  Pt brought back to room via wheelchair and placed back on O2, cardiac monitor, continuous pulse-oximetry.

## 2010-12-18 ENCOUNTER — Encounter (HOSPITAL_COMMUNITY)
Admission: EM | Disposition: A | Discharge status: Home / Self Care | Payer: Self-pay | Source: Home / Self Care | Attending: Internal Medicine

## 2010-12-18 ENCOUNTER — Encounter (HOSPITAL_COMMUNITY): Payer: Self-pay | Admitting: Family Medicine

## 2010-12-18 DIAGNOSIS — D62 Acute posthemorrhagic anemia: Secondary | ICD-10-CM | POA: Diagnosis present

## 2010-12-18 DIAGNOSIS — E119 Type 2 diabetes mellitus without complications: Secondary | ICD-10-CM | POA: Diagnosis present

## 2010-12-18 DIAGNOSIS — K922 Gastrointestinal hemorrhage, unspecified: Secondary | ICD-10-CM | POA: Diagnosis present

## 2010-12-18 HISTORY — PX: ESOPHAGOGASTRODUODENOSCOPY: SHX5428

## 2010-12-18 HISTORY — PX: LAPAROSCOPIC GASTRIC BANDING: SHX1100

## 2010-12-18 LAB — CBC
Hemoglobin: 9.1 g/dL — ABNORMAL LOW (ref 12.0–15.0)
Hemoglobin: 8.4 g/dL — ABNORMAL LOW (ref 12.0–15.0)
MCH: 30.2 pg (ref 26.0–34.0)
MCHC: 33.1 g/dL (ref 30.0–36.0)
MCHC: 34.3 g/dL (ref 30.0–36.0)
Platelets: 202 10*3/uL (ref 150–400)
Platelets: 201 10*3/uL (ref 150–400)
RBC: 3.01 MIL/uL — ABNORMAL LOW (ref 3.87–5.11)
RBC: 2.72 MIL/uL — ABNORMAL LOW (ref 3.87–5.11)
WBC: 9.7 10*3/uL (ref 4.0–10.5)
WBC: 8.6 10*3/uL (ref 4.0–10.5)

## 2010-12-18 LAB — HEMOGLOBIN AND HEMATOCRIT, BLOOD: HCT: 28.7 % — ABNORMAL LOW (ref 36.0–46.0)

## 2010-12-18 LAB — BASIC METABOLIC PANEL
Calcium: 8.1 mg/dL — ABNORMAL LOW (ref 8.4–10.5)
GFR calc Af Amer: >90 mL/min (ref >90)
GFR calc non Af Amer: >90 mL/min (ref >90)
Potassium: 3.6 mEq/L (ref 3.5–5.1)
Sodium: 141 mEq/L (ref 135–145)

## 2010-12-18 LAB — GLUCOSE, CAPILLARY
Glucose-Capillary: 63 mg/dL — ABNORMAL LOW (ref 70–99)
Glucose-Capillary: 81 mg/dL (ref 70–99)

## 2010-12-18 LAB — ABO/RH: ABO/RH(D): O POS

## 2010-12-18 LAB — MRSA PCR SCREENING: MRSA by PCR: NEGATIVE

## 2010-12-18 SURGERY — EGD (ESOPHAGOGASTRODUODENOSCOPY)
Anesthesia: Moderate Sedation

## 2010-12-18 MED ORDER — ACETAMINOPHEN 650 MG RE SUPP: 650.0000 mg | Freq: Four times a day (QID) | RECTAL | Status: DC | PRN

## 2010-12-18 MED ORDER — PANTOPRAZOLE SODIUM 40 MG IV SOLR
40.0000 mg | Freq: Two times a day (BID) | INTRAVENOUS | Status: DC
  Administered 2010-12-18 (×2): 40 mg via INTRAVENOUS
  Filled 2010-12-18 (×4): qty 40

## 2010-12-18 MED ORDER — POTASSIUM CHLORIDE IN NACL 20-0.9 MEQ/L-% IV SOLN
INTRAVENOUS | Status: DC
  Administered 2010-12-18: 15:00:00 via INTRAVENOUS
  Filled 2010-12-18 (×4): qty 1000

## 2010-12-18 MED ORDER — HYDROCODONE-ACETAMINOPHEN 5-325 MG PO TABS: 1.0000 | ORAL_TABLET | ORAL | Status: DC | PRN

## 2010-12-18 MED ORDER — EPINEPHRINE HCL 0.1 MG/ML IJ SOLN
INTRAMUSCULAR | Status: AC
  Filled 2010-12-18: qty 20

## 2010-12-18 MED ORDER — FENTANYL NICU IV SYRINGE 50 MCG/ML
INJECTION | INTRAMUSCULAR | Status: DC | PRN
  Administered 2010-12-18 (×2): 25 ug via INTRAVENOUS

## 2010-12-18 MED ORDER — SUCRALFATE 1 G PO TABS
1.0000 g | ORAL_TABLET | Freq: Three times a day (TID) | ORAL | Status: DC
  Administered 2010-12-18 – 2010-12-22 (×15): 1 g via ORAL
  Filled 2010-12-18 (×20): qty 1

## 2010-12-18 MED ORDER — SODIUM CHLORIDE 0.9 % IJ SOLN
INTRAMUSCULAR | Status: DC | PRN
  Administered 2010-12-18 (×3)

## 2010-12-18 MED ORDER — DEXTROSE 50 % IV SOLN
INTRAVENOUS | Status: AC
  Administered 2010-12-18: 25 mL via INTRAVENOUS
  Filled 2010-12-18: qty 50

## 2010-12-18 MED ORDER — ACETAMINOPHEN 325 MG PO TABS
650.0000 mg | ORAL_TABLET | Freq: Four times a day (QID) | ORAL | Status: DC | PRN
  Administered 2010-12-19 – 2010-12-21 (×4): 650 mg via ORAL
  Filled 2010-12-18 (×4): qty 2

## 2010-12-18 MED ORDER — MIDAZOLAM HCL 10 MG/2ML IJ SOLN
INTRAMUSCULAR | Status: DC | PRN
  Administered 2010-12-18: 2 mg via INTRAVENOUS
  Administered 2010-12-18: 1 mg via INTRAVENOUS
  Administered 2010-12-18: 2 mg via INTRAVENOUS

## 2010-12-18 MED ORDER — ACETAMINOPHEN 325 MG PO TABS: 650.0000 mg | ORAL_TABLET | Freq: Once | ORAL | Status: DC

## 2010-12-18 MED ORDER — DIPHENHYDRAMINE HCL 50 MG/ML IJ SOLN: 25.0000 mg | Freq: Once | INTRAMUSCULAR | Status: DC

## 2010-12-18 MED ORDER — INSULIN ASPART 100 UNIT/ML ~~LOC~~ SOLN
0.0000 [IU] | SUBCUTANEOUS | Status: DC
  Filled 2010-12-18: qty 3

## 2010-12-18 NOTE — ED Notes (Signed)
Pt taken to Endoscopy. 

## 2010-12-18 NOTE — Progress Notes (Signed)
CBG: 63  Treatment: D50 IV 25 mL  Symptoms: None  Follow-up CBG: Time:1600 CBG Result: 81  Possible Reasons for Event: Inadequate meal intake  Comments/MD notified: yes    Kathrynn Speed

## 2010-12-18 NOTE — ED Notes (Signed)
1st blood transfusion finished @ 0300.  No adverse reaction noted.

## 2010-12-18 NOTE — H&P (Addendum)
PCP:   Desmond Dike, MD   Chief Complaint:  Profound dizziness  HPI: This is a 50 year old female who states that 4 days ago, she started getting dizzy on standing. This continued to worsen significantly. Last night she got up to go to the bathroom, she was profoundly dizzy, she passed out on the way back to the bedroom. This was unwitnessed. She is unclear how long she was out for. There was no incontinence. She reports no chest pain prior to syncopalizing. She does report significant palpitations with standing. Her she also reports significant shortness of breath. She states as though she feels as though she is spinning. Today she tried to go to work, she was so dizzy her husband had to assist her in building, they sent her to the ER.  The patient states that for the past 2 weeks she's had black stools. 2 weeks ago she had an a surgery for a slipped lap band. She thought the black stool was natural postoperatively. The color lightened and for 48 hours then has been consistently black stool. She reports no abdominal pain, she does not use any NSAIDs, no alcohol. She reports no GERD-type symptoms. She's never had a GI bleed prior. She's never had a colonoscopy nor an EGD. History obtained from the patient who appears viable, her husband is at her bedside. Dear my interview patient remains quite dizzy.  Review of Systems: [Positive bolded] The patient denies anorexia, fever, weight loss,, vision loss, decreased hearing, hoarseness, chest pain, syncope, dyspnea on exertion, peripheral edema, balance deficits, hemoptysis, abdominal pain, melena, hematochezia, severe indigestion/heartburn, hematuria, incontinence, genital sores, muscle weakness, suspicious skin lesions, transient blindness, difficulty walking, depression, unusual weight change, abnormal bleeding, enlarged lymph nodes, angioedema, and breast masses.  Past Medical History: Past Medical History  Diagnosis Date  . Diabetes mellitus   . Leg  swelling   . Difficulty in swallowing   . Arthritis   . Bruises easily   . Reflux    Past Surgical History  Procedure Date  . Laparoscopic gastric banding 01-05-2008  . Endovenous ablation saphenous vein w/ laser 06-2008  . Cholecystectomy   . Salphingooophorectomy   . Wedge resection of ovary LEFT OVARY   . Carpel tunnel bilatereal     Medications: Prior to Admission medications   Medication Sig Start Date End Date Taking? Authorizing Provider  Calcium Carb-Cholecalciferol (CALCIUM 1000 + D PO) Take 1,000 mg by mouth daily.     Yes Historical Provider, MD  Multiple Vitamins-Minerals (MULTIVITAMINS THER. W/MINERALS) TABS Take 1 tablet by mouth daily.     Yes Historical Provider, MD  CLINDAMYCIN HCL PO Take 750 mg by mouth 4 (four) times daily - after meals and at bedtime.      Historical Provider, MD    Allergies:   Allergies  Allergen Reactions  . Ancef (Cefazolin Sodium)   . Contrast Media (Iodinated Diagnostic Agents)     apnea  . Penicillins Other (See Comments)    Patient does not recall the reaction, it happened when she was a child    Social History:  reports that she has never smoked. She has never used smokeless tobacco. She reports that she does not drink alcohol or use illicit drugs.  Family History: Family History  Problem Relation Age of Onset  . Heart disease Father   . Kidney disease Father   . Peripheral vascular disease Father   . Diabetes Mother   . Arthritis Mother     Physical Exam: Filed Vitals:  12/17/10 2245 12/17/10 2300 12/17/10 2312 12/17/10 2315  BP: 136/75  118/49   Pulse: 88 88 96 96  Temp:   98.4 F (36.9 C)   TempSrc:   Oral   Resp: 23 23 15 21   SpO2: 100% 100% 98% 98%    General:  Alert and oriented times three, well developed and nourished, no acute distress Eyes: PERRLA, pale conjunctiva, no scleral icterus ENT: Dry oral mucosa, neck supple, no thyromegaly Lungs: clear to ascultation, no wheeze, no crackles, no use of  accessory muscles Cardiovascular: regular rate and rhythm, no regurgitation, no gallops, no murmurs. No carotid bruits, no JVD Abdomen: soft, positive BS, non-tender, non-distended, no organomegaly, not an acute abdomen, obese abdomen with scattered ecchymosis, no significant tenderness to palpation GU: not examined Neuro: CN II - XII grossly intact, sensation intact Musculoskeletal: strength 5/5 all extremities, no clubbing, cyanosis or edema Skin: no rash, no subcutaneous crepitation, no decubitus Psych: appropriate patient   Labs on Admission:   [facility] 12/17/10 2223  NA 141  K 3.9  CL 110  CO2 26  GLUCOSE 100*  BUN 27*  CREATININE 0.46*  CALCIUM 8.3*  MG --  PHOS --    Basename 12/17/10 2223  AST 11  ALT 11  ALKPHOS 42  BILITOT 0.1*  PROT 5.1*  ALBUMIN 2.7*   No results found for this basename: LIPASE:2,AMYLASE:2 in the last 72 hours  Basename 12/17/10 2223  WBC 8.4  NEUTROABS 5.3  HGB 8.5*  HCT 25.2*  MCV 90.6  PLT 218   No results found for this basename: CKTOTAL:3,CKMB:3,CKMBINDEX:3,TROPONINI:3 in the last 72 hours No results found for this basename: TSH,T4TOTAL,FREET3,T3FREE,THYROIDAB in the last 72 hours No results found for this basename: VITAMINB12:2,FOLATE:2,FERRITIN:2,TIBC:2,IRON:2,RETICCTPCT:2 in the last 72 hours   EKG: Normal sinus rhythm tachycardic  Radiological Exams on Admission: No results found.  Assessment/Plan Present on Admission:  .GI bleed .Anemia associated with acute blood loss Admit to step down Serial H&H's 2 units packed red blood cell has been ordered in the ER Standing order-transfuse if hemoglobin less than 8 Protonix every 12hr   Continue IV fluids No anticoagulation GI consulted Dr Elnoria Howard for EGD N.p.o. Diabetes mellitus No medications of this noted Sliding-scale insulin to monitor Morbid obesity BMI greater than 40 Status post lap band with recent surgery for slipped band   SCD's/GI prophylaxis CODE  STATUS: Full code Team 6/Dr. Domingo Madeira, Nettie Wyffels 12/18/2010, 12:36 AM

## 2010-12-18 NOTE — Consult Note (Signed)
Reason for Consult:Melena Referring Physician: [facility]    [name] HPI: This is a 50 year old female s/p lap band surgery in 2010 and a redo 11/2010 who is admitted to the hospital for complaints of melena x 2 weeks and dizziness.  She started to see the melena two weeks ago, but this past week it markedly increased.  No complaints of abdominal pain, nausea, vomiting, or fever.  At 11 PM last evening she woke up after having a syncopal episode.  She was subsequently told to go to the ER at work and she was then identified to have an HGB of 8.5 and heme positive stool.  She denies taking any NSAIDs.  As a result a GI consultation was requested.  Past Medical History  Diagnosis Date  . Leg swelling   . Difficulty in swallowing   . Arthritis   . Bruises easily   . Reflux   . Shortness of breath   . Blood transfusion   . GERD (gastroesophageal reflux disease)   . Peripheral vascular disease   . Anemia   . Diabetes mellitus     Pt had DM prior to Lap Band. Does not have it now per pt.    Past Surgical History  Procedure Date  . Laparoscopic gastric banding 01-05-2008  . Endovenous ablation saphenous vein w/ laser 06-2008  . Cholecystectomy   . Salphingooophorectomy   . Wedge resection of ovary LEFT OVARY   . Carpel tunnel bilatereal   . Diagnostic laparoscopy   . Appendectomy   . Laparoscopic gastric banding 12/18/2010    01/04/2009    Family History  Problem Relation Age of Onset  . Heart disease Father   . Kidney disease Father   . Peripheral vascular disease Father   . Diabetes Mother   . Arthritis Mother     Social History:  reports that she has never smoked. She has never used smokeless tobacco. She reports that she does not drink alcohol or use illicit drugs.  Allergies:  Allergies  Allergen Reactions  . Ancef (Cefazolin Sodium)   . Contrast Media (Iodinated Diagnostic Agents)     apnea  . Penicillins Other (See Comments)    Patient does not  recall the reaction, it happened when she was a child    Medications: I have reviewed the patient's current medications.  Results for orders placed during the hospital encounter of 12/17/10 (from the past 24 hour(s))  OCCULT BLOOD, POC DEVICE     Status: Normal   Collection Time   12/17/10 10:02 PM      Component Value Range   Fecal Occult Bld POSITIVE    CBC     Status: Abnormal   Collection Time   12/17/10 10:23 PM      Component Value Range   WBC 8.4  4.0 - 10.5 (K/uL)   RBC 2.78 (*) 3.87 - 5.11 (MIL/uL)   Hemoglobin 8.5 (*) 12.0 - 15.0 (g/dL)   HCT 16.1 (*) 09.6 - 46.0 (%)   MCV 90.6  78.0 - 100.0 (fL)   MCH 30.6  26.0 - 34.0 (pg)   MCHC 33.7  30.0 - 36.0 (g/dL)   RDW 04.5  40.9 - 81.1 (%)   Platelets 218  150 - 400 (K/uL)  DIFFERENTIAL     Status: Normal   Collection Time   12/17/10 10:23 PM      Component Value Range   Neutrophils Relative 63  43 - 77 (%)  Lymphocytes Relative 29  12 - 46 (%)   Monocytes Relative 5  3 - 12 (%)   Eosinophils Relative 2  0 - 5 (%)   Basophils Relative 1  0 - 1 (%)   Neutro Abs 5.3  1.7 - 7.7 (K/uL)   Lymphs Abs 2.4  0.7 - 4.0 (K/uL)   Monocytes Absolute 0.4  0.1 - 1.0 (K/uL)   Eosinophils Absolute 0.2  0.0 - 0.7 (K/uL)   Basophils Absolute 0.1  0.0 - 0.1 (K/uL)   WBC Morphology TOXIC GRANULATION     Smear Review LARGE PLATELETS PRESENT    COMPREHENSIVE METABOLIC PANEL     Status: Abnormal   Collection Time   12/17/10 10:23 PM      Component Value Range   Sodium 141  135 - 145 (mEq/L)   Potassium 3.9  3.5 - 5.1 (mEq/L)   Chloride 110  96 - 112 (mEq/L)   CO2 26  19 - 32 (mEq/L)   Glucose, Bld 100 (*) 70 - 99 (mg/dL)   BUN 27 (*) 6 - 23 (mg/dL)   Creatinine, Ser 1.61 (*) 0.50 - 1.10 (mg/dL)   Calcium 8.3 (*) 8.4 - 10.5 (mg/dL)   Total Protein 5.1 (*) 6.0 - 8.3 (g/dL)   Albumin 2.7 (*) 3.5 - 5.2 (g/dL)   AST 11  0 - 37 (U/L)   ALT 11  0 - 35 (U/L)   Alkaline Phosphatase 42  39 - 117 (U/L)   Total Bilirubin 0.1 (*) 0.3 - 1.2  (mg/dL)   GFR calc non Af Amer >90  >90 (mL/min)   GFR calc Af Amer >90  >90 (mL/min)  PROTIME-INR     Status: Normal   Collection Time   12/17/10 10:23 PM      Component Value Range   Prothrombin Time 15.1  11.6 - 15.2 (seconds)   INR 1.17  0.00 - 1.49   APTT     Status: Normal   Collection Time   12/17/10 10:23 PM      Component Value Range   aPTT 28  24 - 37 (seconds)  TYPE AND SCREEN     Status: Normal (Preliminary result)   Collection Time   12/17/10 11:10 PM      Component Value Range   ABO/RH(D) O POS     Antibody Screen NEG     Sample Expiration 12/20/2010     Unit Number [id number]     Blood Component Type RED CELLS,LR     Unit division 00     Status of Unit ISSUED     Transfusion Status OK TO TRANSFUSE     Crossmatch Result Compatible     Unit Number [id number]     Blood Component Type RED CELLS,LR     Unit division 00     Status of Unit ISSUED     Transfusion Status OK TO TRANSFUSE     Crossmatch Result Compatible    ABO/RH     Status: Normal   Collection Time   12/17/10 11:10 PM      Component Value Range   ABO/RH(D) O POS    PREPARE RBC (CROSSMATCH)     Status: Normal   Collection Time   12/17/10 11:30 PM      Component Value Range   Order Confirmation ORDER PROCESSED BY BLOOD BANK    CBC     Status: Abnormal   Collection Time   12/18/10  6:02 AM  Component Value Range   WBC 7.8  4.0 - 10.5 (K/uL)   RBC 3.01 (*) 3.87 - 5.11 (MIL/uL)   Hemoglobin 9.1 (*) 12.0 - 15.0 (g/dL)   HCT 16.1 (*) 09.6 - 46.0 (%)   MCV 91.4  78.0 - 100.0 (fL)   MCH 30.2  26.0 - 34.0 (pg)   MCHC 33.1  30.0 - 36.0 (g/dL)   RDW 04.5  40.9 - 81.1 (%)   Platelets 202  150 - 400 (K/uL)  BASIC METABOLIC PANEL     Status: Abnormal   Collection Time   12/18/10  7:30 AM      Component Value Range   Sodium 141  135 - 145 (mEq/L)   Potassium 3.6  3.5 - 5.1 (mEq/L)   Chloride 111  96 - 112 (mEq/L)   CO2 25  19 - 32 (mEq/L)   Glucose, Bld 93  70 - 99 (mg/dL)   BUN 25 (*) 6 - 23  (mg/dL)   Creatinine, Ser 9.14 (*) 0.50 - 1.10 (mg/dL)   Calcium 8.1 (*) 8.4 - 10.5 (mg/dL)   GFR calc non Af Amer >90  >90 (mL/min)   GFR calc Af Amer >90  >90 (mL/min)  CBC     Status: Abnormal   Collection Time   12/18/10 10:13 AM      Component Value Range   WBC 9.7  4.0 - 10.5 (K/uL)   RBC 3.01 (*) 3.87 - 5.11 (MIL/uL)   Hemoglobin 9.1 (*) 12.0 - 15.0 (g/dL)   HCT 78.2 (*) 95.6 - 46.0 (%)   MCV 90.7  78.0 - 100.0 (fL)   MCH 30.2  26.0 - 34.0 (pg)   MCHC 33.3  30.0 - 36.0 (g/dL)   RDW 21.3 (*) 08.6 - 15.5 (%)   Platelets 201  150 - 400 (K/uL)     No results found.  ROS:  As stated above in the HPI otherwise negative.  Blood pressure 151/86, pulse 87, temperature 98.4 F (36.9 C), temperature source Oral, resp. rate 25, height 5\' 7"  (1.702 m), SpO2 99.00%.    PE: Gen: NAD, Alert and Oriented HEENT:  [place]/AT, EOMI Neck: Supple, no LAD Lungs: CTA Bilaterally CV: RRR without M/G/R ABM: Soft, NTND, +BS Ext: No C/C/E  Assessment/Plan: 1) Upper GI Bleed.  That patient is stable at this time, but she will require further evaluation with an EGD.  Pending the results further recommendations will be made.  Plan: 1) EGD now.  Addendum:  The patient was identified to have two lesser curvature ulcers and one of the ulcers exhibited a visible vessel.  As reported in the procedure note placement of the first hemoclip precipitated arterial spurting.  BICAP was used before placement of the second hemoclip.  Two applications were applied, but it did not arrest or slow the bleeding.  The part of the BICAP was inadvertently omitted from the official procedure not.  Comfort Iversen D 12/18/2010, 11:09 AM

## 2010-12-18 NOTE — ED Notes (Signed)
If CBC results with a HGB of 9 or greater, pt may be down graded to a tele bed.

## 2010-12-18 NOTE — ED Notes (Signed)
Pt tolerated transfusion well with no reaction.  2nd transfusion finished @ 0540. CBC ordered stat.

## 2010-12-18 NOTE — Progress Notes (Signed)
Subjective: Pt sleepy after EGD.  Has had at least one large black BM since scope.  No abd pain, no dizziness, no SOB.  Objective: Weight change:   Intake/Output Summary (Last 24 hours) at 12/18/10 1449 Last data filed at 12/18/10 0331  Gross per 24 hour  Intake      0 ml  Output      0 ml  Net      0 ml   BP 127/67  Pulse 93  Temp(Src) 98.5 F (36.9 C) (Oral)  Resp 21  Ht 5\' 7"  (1.702 m)  Wt 119 kg (262 lb 5.6 oz)  BMI 41.09 kg/m2  SpO2 98% General appearance: fatigued, no distress and mildly obese Lungs: clear to auscultation bilaterally Heart: regular rate and rhythm, S1, S2 normal, no murmur, click, rub or gallop Abdomen: soft, non-tender; bowel sounds normal; no masses,  no organomegaly Extremities: extremities normal, atraumatic, no cyanosis or edema  Lab Results: Basic Metabolic Panel:  Basename 12/18/10 0730 12/17/10 2223  NA 141 141  K 3.6 3.9  CL 111 110  CO2 25 26  GLUCOSE 93 100*  BUN 25* 27*  CREATININE 0.42* 0.46*  CALCIUM 8.1* 8.3*  MG -- --  PHOS -- --   Liver Function Tests:  [facility] 12/17/10 2223  AST 11  ALT 11  ALKPHOS 42  BILITOT 0.1*  PROT 5.1*  ALBUMIN 2.7*  CBC:  Basename 12/18/10 1013 12/18/10 0602 12/17/10 2223  WBC 9.7 7.8 --  NEUTROABS -- -- 5.3  HGB 9.1* 9.1* --  HCT 27.3* 27.5* --  MCV 90.7 91.4 --  PLT 201 202 --  Coagulation:  Basename 12/17/10 2223  LABPROT 15.1  INR 1.17    Micro Results: Recent Results (from the past 240 hour(s))  MRSA PCR SCREENING     Status: Normal   Collection Time   12/18/10 11:14 AM      Component Value Range Status Comment   MRSA by PCR NEGATIVE  NEGATIVE  Final     Studies/Results: Medications: Scheduled Meds:   . pantoprazole (PROTONIX) IV  40 mg Intravenous Q12H  . sodium chloride  1,000 mL Intravenous Once  . sucralfate  1 g Oral TID WC & HS   Continuous Infusions:   . 0.9 % NaCl with KCl 20 mEq / L     PRN Meds:.acetaminophen, acetaminophen, EPINEPHrine 1:10,000,  10 mL syringe/NS, 10 mL vial for sclerotherapy inj mixture, fentaNYL, HYDROcodone-acetaminophen, midazolam  Assessment/Plan: Patient Active Hospital Problem List: Anemia associated with acute blood loss (12/18/2010)   Assessment: See below  GI bleed (12/18/2010)   Assessment: s/p EGD with unsuccessful attempt to stop bleeding.  Keeping in ICU, int radiology aware.  Following q6 h/h  Diabetes mellitus (12/18/2010) Added q4 hrs sliding scale.   LOS: 1 day   Hollice Espy 12/18/2010, 2:49 PM

## 2010-12-19 LAB — TYPE AND SCREEN
ABO/RH(D): O POS
Unit division: 0

## 2010-12-19 LAB — CBC
HCT: 25.7 % — ABNORMAL LOW (ref 36.0–46.0)
HCT: 25.2 % — ABNORMAL LOW (ref 36.0–46.0)
HCT: 25.9 % — ABNORMAL LOW (ref 36.0–46.0)
HCT: 24.9 % — ABNORMAL LOW (ref 36.0–46.0)
Hemoglobin: 8.5 g/dL — ABNORMAL LOW (ref 12.0–15.0)
Hemoglobin: 8.7 g/dL — ABNORMAL LOW (ref 12.0–15.0)
Hemoglobin: 8.2 g/dL — ABNORMAL LOW (ref 12.0–15.0)
MCH: 30.9 pg (ref 26.0–34.0)
MCH: 30.9 pg (ref 26.0–34.0)
MCHC: 33.1 g/dL (ref 30.0–36.0)
MCHC: 32.9 g/dL (ref 30.0–36.0)
MCV: 91.6 fL (ref 78.0–100.0)
MCV: 91.5 fL (ref 78.0–100.0)
Platelets: 159 10*3/uL (ref 150–400)
RBC: 2.79 MIL/uL — ABNORMAL LOW (ref 3.87–5.11)
RBC: 2.75 MIL/uL — ABNORMAL LOW (ref 3.87–5.11)
RBC: 2.83 MIL/uL — ABNORMAL LOW (ref 3.87–5.11)
RBC: 2.65 MIL/uL — ABNORMAL LOW (ref 3.87–5.11)
RDW: 16.1 % — ABNORMAL HIGH (ref 11.5–15.5)
WBC: 6 10*3/uL (ref 4.0–10.5)
WBC: 6.2 10*3/uL (ref 4.0–10.5)

## 2010-12-19 LAB — BASIC METABOLIC PANEL
BUN: 14 mg/dL (ref 6–23)
CO2: 25 mEq/L (ref 19–32)
Calcium: 8.3 mg/dL — ABNORMAL LOW (ref 8.4–10.5)
Chloride: 111 mEq/L (ref 96–112)
Creatinine, Ser: 0.45 mg/dL — ABNORMAL LOW (ref 0.50–1.10)
Glucose, Bld: 90 mg/dL (ref 70–99)

## 2010-12-19 LAB — GLUCOSE, CAPILLARY
Glucose-Capillary: 79 mg/dL (ref 70–99)
Glucose-Capillary: 77 mg/dL (ref 70–99)

## 2010-12-19 MED ORDER — SODIUM CHLORIDE 0.9 % IV SOLN
8.0000 mg/h | INTRAVENOUS | Status: DC
  Administered 2010-12-19 – 2010-12-22 (×6): 8 mg/h via INTRAVENOUS
  Filled 2010-12-19 (×17): qty 80

## 2010-12-19 MED ORDER — KCL IN DEXTROSE-NACL 40-5-0.9 MEQ/L-%-% IV SOLN
INTRAVENOUS | Status: DC
  Administered 2010-12-19 – 2010-12-22 (×5): via INTRAVENOUS
  Filled 2010-12-19 (×11): qty 1000

## 2010-12-19 NOTE — Progress Notes (Signed)
/  Rhonda Davis, RN, BSN, CCM/CHART REVIEW FOR UR PERFORMED. 

## 2010-12-19 NOTE — Progress Notes (Signed)
  Subjective: No acute events overnight.  The patient feels well.  She is aware of the findings and interventions from the EGD yesterday morning.  Objective: Vital signs in last 24 hours: Temp:  [97.7 F (36.5 C)-98.5 F (36.9 C)] 97.9 F (36.6 C) (11/13 0400) Pulse Rate:  [85-97] 85  (11/13 0350) Resp:  [15-27] 19  (11/13 0350) BP: (116-151)/(58-90) 123/70 mmHg (11/13 0350) SpO2:  [95 %-100 %] 99 % (11/13 0350) Weight:  [119 kg (262 lb 5.6 oz)] 262 lb 5.6 oz (119 kg) (11/12 1100) Last BM Date: 12/17/10  Intake/Output from previous day: 11/12 0701 - 11/13 0700 In: 1220 [P.O.:120; I.V.:1100] Out: 800 [Urine:800] Intake/Output this shift:    General appearance: alert and no distress Resp: clear to auscultation bilaterally Cardio: regular rate and rhythm, S1, S2 normal, no murmur, click, rub or gallop GI: soft, non-tender; bowel sounds normal; no masses,  no organomegaly Extremities: extremities normal, atraumatic, no cyanosis or edema  Lab Results:  Basename 12/19/10 0530 12/19/10 0110 12/18/10 1800 12/18/10 1502  WBC 6.0 6.8 -- 8.6  HGB 8.5* 8.5* 9.5* --  HCT 25.2* 25.7* 28.7* --  PLT 159 182 -- 187   BMET  Basename 12/19/10 0530 12/18/10 0730 12/17/10 2223  NA 141 141 141  K 3.8 3.6 3.9  CL 111 111 110  CO2 25 25 26   GLUCOSE 90 93 100*  BUN 14 25* 27*  CREATININE 0.45* 0.42* 0.46*  CALCIUM 8.3* 8.1* 8.3*   LFT  Basename 12/17/10 2223  PROT 5.1*  ALBUMIN 2.7*  AST 11  ALT 11  ALKPHOS 42  BILITOT 0.1*  BILIDIR --  IBILI --   PT/INR  Basename 12/17/10 2223  LABPROT 15.1  INR 1.17   Hepatitis Panel No results found for this basename: HEPBSAG,HCVAB,HEPAIGM,HEPBIGM in the last 72 hours C-Diff No results found for this basename: CDIFFTOX:3 in the last 72 hours Fecal Lactopherrin No results found for this basename: FECLLACTOFRN in the last 72 hours  Studies/Results: No results found.  Medications:  Scheduled:   . acetaminophen  650 mg Oral Once    . dextrose      . diphenhydrAMINE  25 mg Intravenous Once  . insulin aspart  0-9 Units Subcutaneous Q4H  . sucralfate  1 g Oral TID WC & HS  . DISCONTD: pantoprazole (PROTONIX) IV  40 mg Intravenous Q12H   Continuous:   . dextrose 5 % and 0.9 % NaCl with KCl 40 mEq/L 100 mL/hr at 12/19/10 0048  . pantoprozole (PROTONIX) infusion    . DISCONTD: 0.9 % NaCl with KCl 20 mEq / L 100 mL/hr at 12/19/10 0000    Assessment/Plan: 1) Gastric ulcer with visible vessel.   The patient remains hemodynamically stable.  Her HGB has remained stable in the 8 range.  I will have her continue the NPO status.  The highest risk for rebleeding is within the next 72 hours after intervention.  Plan: 1) Change Protonix to a continuous drip. 2) Change CBC to Q8 hours. 3) Continue with sucralfate. 4) Maintain NPO.  LOS: 2 days   Jael Kostick D 12/19/2010, 7:33 AM

## 2010-12-19 NOTE — Progress Notes (Signed)
INITIAL ADULT NUTRITION ASSESSMENT Date: 12/19/2010   Time: 1:00 PM Reason for Assessment: consult  ASSESSMENT: Female 50 y.o.  Dx: Anemia associated with acute blood loss  Hx:  Past Medical History  Diagnosis Date  . Leg swelling   . Difficulty in swallowing   . Arthritis   . Bruises easily   . Reflux   . Shortness of breath   . Blood transfusion   . GERD (gastroesophageal reflux disease)   . Peripheral vascular disease   . Anemia   . Diabetes mellitus     Pt had DM prior to Lap Band. Does not have it now per pt.   Past Surgical History  Procedure Date  . Laparoscopic gastric banding 01-05-2008  . Endovenous ablation saphenous vein w/ laser 06-2008  . Cholecystectomy   . Salphingooophorectomy   . Wedge resection of ovary LEFT OVARY   . Carpel tunnel bilatereal   . Diagnostic laparoscopy   . Appendectomy   . Laparoscopic gastric banding 12/18/2010    01/04/2009    Related Meds:  Scheduled Meds:   . acetaminophen  650 mg Oral Once  . dextrose      . diphenhydrAMINE  25 mg Intravenous Once  . insulin aspart  0-9 Units Subcutaneous Q4H  . sucralfate  1 g Oral TID WC & HS  . DISCONTD: pantoprazole (PROTONIX) IV  40 mg Intravenous Q12H   Continuous Infusions:   . dextrose 5 % and 0.9 % NaCl with KCl 40 mEq/L 100 mL/hr at 12/19/10 0048  . pantoprozole (PROTONIX) infusion 8 mg/hr (12/19/10 0830)  . DISCONTD: 0.9 % NaCl with KCl 20 mEq / L 100 mL/hr at 12/19/10 0000   PRN Meds:.acetaminophen, acetaminophen, HYDROcodone-acetaminophen, DISCONTD: EPINEPHrine 1:10,000, 10 mL syringe/NS, 10 mL vial for sclerotherapy inj mixture, DISCONTD: fentaNYL, DISCONTD: midazolam   Ht: 5\' 7"  (170.2 cm)  Wt: 262 lb 5.6 oz (119 kg)  Ideal Wt: 61.3 kg % Ideal Wt: 193%  Usual Wt: unsure of wt hx % Usual Wt:   Body mass index is 41.09 kg/(m^2).  Food/Nutrition Related Hx:  Pt s/p lap band surgery in 2009, recently had band readjusted after slip. Since surgery pt has had dark  stools, recently became increasingly dizzy prompting pt to come to ED.   Pt admitted for GI bleed, receiving blood transfusions, remains NPO.  Pt denies having appetite.  Pt states no education needs at this time r/t lap band, no other concerns at this time aside from healing GI bleed.  Labs:  CMP     Component Value Date/Time   NA 141 12/19/2010 0530   K 3.8 12/19/2010 0530   CL 111 12/19/2010 0530   CO2 25 12/19/2010 0530   GLUCOSE 90 12/19/2010 0530   BUN 14 12/19/2010 0530   CREATININE 0.45* 12/19/2010 0530   CREATININE 0.63 08/24/2010 1107   CALCIUM 8.3* 12/19/2010 0530   PROT 5.1* 12/17/2010 2223   ALBUMIN 2.7* 12/17/2010 2223   AST 11 12/17/2010 2223   ALT 11 12/17/2010 2223   ALKPHOS 42 12/17/2010 2223   BILITOT 0.1* 12/17/2010 2223   GFRNONAA >90 12/19/2010 0530   GFRAA >90 12/19/2010 0530    CBC    Component Value Date/Time   WBC 6.0 12/19/2010 0530   RBC 2.75* 12/19/2010 0530   HGB 8.5* 12/19/2010 0530   HCT 25.2* 12/19/2010 0530   PLT 159 12/19/2010 0530   MCV 91.6 12/19/2010 0530   MCH 30.9 12/19/2010 0530   MCHC 33.7 12/19/2010 0530  RDW 16.4* 12/19/2010 0530   LYMPHSABS 2.4 12/17/2010 2223   MONOABS 0.4 12/17/2010 2223   EOSABS 0.2 12/17/2010 2223   BASOSABS 0.1 12/17/2010 2223    Intake: NPO, IVF Output: bloody BMs  Diet Order: NPO  Supplements/Tube Feeding: none at this time  IVF:    dextrose 5 % and 0.9 % NaCl with KCl 40 mEq/L Last Rate: 100 mL/hr at 12/19/10 0048  pantoprozole (PROTONIX) infusion Last Rate: 8 mg/hr (12/19/10 0830)  DISCONTD: 0.9 % NaCl with KCl 20 mEq / L Last Rate: 100 mL/hr at 12/19/10 0000    Estimated Nutritional Needs:   Kcal:2070-2250 kcal Protein:108-126 g Fluid:~2.7 L/day  NUTRITION DIAGNOSIS: -Inadequate oral intake (NI-2.1).  Status: Ongoing  RELATED TO: bowel rest  AS EVIDENCE BY: pt with GI bleed, NPO  MONITORING/EVALUATION(Goals): 1.  Food/Beverage; diet advancement per MD  discretion   EDUCATION NEEDS: -Education needs addressed  INTERVENTION: 1.  Modify diet; advancement to Regular per MD discretion.    Dietitian #: [phone]  DOCUMENTATION CODES Per approved criteria  -Morbid Obesity    Loyce Dys Sue-Ellen 12/19/2010, 1:00 PM

## 2010-12-19 NOTE — Progress Notes (Signed)
Subjective: Patient doing okay. Not much appetite. For slightly lightheaded. No other complaints, no shortness of breath or chest pain. In the past night has had 2 episodes of black bowel movements.  Objective: Weight change:   Intake/Output Summary (Last 24 hours) at 12/19/10 1025 Last data filed at 12/19/10 0900  Gross per 24 hour  Intake   1445 ml  Output    800 ml  Net    645 ml   Filed Vitals:   12/19/10 0800  BP: 120/81  Pulse: 81  Temp: 97.8 F (36.6 C)  Resp: 20  General appearance: fatigued, no distress and mildly obese  Lungs: clear to auscultation bilaterally  Heart: regular rate and rhythm, S1, S2 normal, no murmur, click, rub or gallop  Abdomen: soft, non-tender; bowel sounds normal; no masses, no organomegaly  Extremities: extremities normal, atraumatic, no cyanosis or edema    Lab Results: Basic Metabolic Panel:  Basename 12/19/10 0530 12/18/10 0730  NA 141 141  K 3.8 3.6  CL 111 111  CO2 25 25  GLUCOSE 90 93  BUN 14 25*  CREATININE 0.45* 0.42*  CALCIUM 8.3* 8.1*  MG -- --  PHOS -- --   Liver Function Tests:  [facility] 12/17/10 2223  AST 11  ALT 11  ALKPHOS 42  BILITOT 0.1*  PROT 5.1*  ALBUMIN 2.7*   CBC:  Basename 12/19/10 0530 12/19/10 0110 12/17/10 2223  WBC 6.0 6.8 --  NEUTROABS -- -- 5.3  HGB 8.5* 8.5* --  HCT 25.2* 25.7* --  MCV 91.6 92.1 --  PLT 159 182 --  CBG:  Basename 12/19/10 0742 12/19/10 0344 12/18/10 2308 12/18/10 2003 12/18/10 1705 12/18/10 1637  GLUCAP 77 90 79 81 81 63*    Micro Results: Recent Results (from the past 240 hour(s))  MRSA PCR SCREENING     Status: Normal   Collection Time   12/18/10 11:14 AM      Component Value Range Status Comment   MRSA by PCR NEGATIVE  NEGATIVE  Final     Medications: Scheduled Meds:   . acetaminophen  650 mg Oral Once  . dextrose      . diphenhydrAMINE  25 mg Intravenous Once  . insulin aspart  0-9 Units Subcutaneous Q4H  . sucralfate  1 g Oral TID WC & HS  .  DISCONTD: pantoprazole (PROTONIX) IV  40 mg Intravenous Q12H   Continuous Infusions:   . dextrose 5 % and 0.9 % NaCl with KCl 40 mEq/L 100 mL/hr at 12/19/10 0048  . pantoprozole (PROTONIX) infusion 8 mg/hr (12/19/10 0830)  . DISCONTD: 0.9 % NaCl with KCl 20 mEq / L 100 mL/hr at 12/19/10 0000   PRN Meds:.acetaminophen, acetaminophen, HYDROcodone-acetaminophen, DISCONTD: EPINEPHrine 1:10,000, 10 mL syringe/NS, 10 mL vial for sclerotherapy inj mixture, DISCONTD: fentaNYL, DISCONTD: midazolam  Assessment/Plan: Patient Active Hospital Problem List: Anemia associated with acute blood loss (12/18/2010)   Assessment: Staying Stable, see below  GI bleed (12/18/2010)   Assessment: GI following as well, follow hemoglobin, continue n.p.o.  Diabetes mellitus (12/18/2010)  every 4 hours sliding scale, CBG stable.   LOS: 2 days   KRISHNAN,SENDIL K 12/19/2010, 10:25 AM

## 2010-12-20 LAB — CBC
HCT: 25.5 % — ABNORMAL LOW (ref 36.0–46.0)
HCT: 29 % — ABNORMAL LOW (ref 36.0–46.0)
Hemoglobin: 8.3 g/dL — ABNORMAL LOW (ref 12.0–15.0)
Hemoglobin: 9 g/dL — ABNORMAL LOW (ref 12.0–15.0)
Hemoglobin: 9.6 g/dL — ABNORMAL LOW (ref 12.0–15.0)
MCHC: 33.1 g/dL (ref 30.0–36.0)
MCV: 93.1 fL (ref 78.0–100.0)
MCV: 94.4 fL (ref 78.0–100.0)
Platelets: 208 10*3/uL (ref 150–400)
RBC: 2.91 MIL/uL — ABNORMAL LOW (ref 3.87–5.11)
RDW: 16.6 % — ABNORMAL HIGH (ref 11.5–15.5)
WBC: 4.6 10*3/uL (ref 4.0–10.5)
WBC: 5.8 10*3/uL (ref 4.0–10.5)
WBC: 5.9 10*3/uL (ref 4.0–10.5)

## 2010-12-20 NOTE — Progress Notes (Signed)
  Subjective: No acute events overnight.  The patient feels well.  She reports that her last melenic bowel movement was yesterday morning.  Objective: Vital signs in last 24 hours: Temp:  [97.3 F (36.3 C)-98.7 F (37.1 C)] 97.7 F (36.5 C) (11/14 0400) Pulse Rate:  [79-85] 79  (11/13 1200) Resp:  [17-20] 17  (11/14 0400) BP: (117-130)/(62-81) 117/65 mmHg (11/14 0400) SpO2:  [97 %-100 %] 97 % (11/14 0400) Last BM Date: 12/17/10  Intake/Output from previous day: 11/13 0701 - 11/14 0700 In: 2525 [I.V.:2525] Out: 2950 [Urine:2950] Intake/Output this shift:    General appearance: alert and no distress Resp: clear to auscultation bilaterally Cardio: regular rate and rhythm, S1, S2 normal, no murmur, click, rub or gallop GI: soft, non-tender; bowel sounds normal; no masses,  no organomegaly Extremities: extremities normal, atraumatic, no cyanosis or edema  Lab Results:  [facility] 12/19/10 2320 12/19/10 1351 12/19/10 0530  WBC 4.8 6.2 6.0  HGB 8.2* 8.7* 8.5*  HCT 24.9* 25.9* 25.2*  PLT 172 181 159   BMET  Basename 12/19/10 0530 12/18/10 0730 12/17/10 2223  NA 141 141 141  K 3.8 3.6 3.9  CL 111 111 110  CO2 25 25 26   GLUCOSE 90 93 100*  BUN 14 25* 27*  CREATININE 0.45* 0.42* 0.46*  CALCIUM 8.3* 8.1* 8.3*   LFT  Basename 12/17/10 2223  PROT 5.1*  ALBUMIN 2.7*  AST 11  ALT 11  ALKPHOS 42  BILITOT 0.1*  BILIDIR --  IBILI --   PT/INR  Basename 12/17/10 2223  LABPROT 15.1  INR 1.17   Hepatitis Panel No results found for this basename: HEPBSAG,HCVAB,HEPAIGM,HEPBIGM in the last 72 hours C-Diff No results found for this basename: CDIFFTOX:3 in the last 72 hours Fecal Lactopherrin No results found for this basename: FECLLACTOFRN in the last 72 hours  Studies/Results: No results found.  Medications:  Scheduled:   . acetaminophen  650 mg Oral Once  . diphenhydrAMINE  25 mg Intravenous Once  . sucralfate  1 g Oral TID WC & HS  . DISCONTD: insulin aspart   0-9 Units Subcutaneous Q4H   Continuous:   . dextrose 5 % and 0.9 % NaCl with KCl 40 mEq/L 100 mL/hr at 12/20/10 0637  . pantoprozole (PROTONIX) infusion 8 mg/hr (12/20/10 0200)    Assessment/Plan: 1) Bleeding gastric ulcer.   The patient remains stable.  Her HGB is still in the 8 range, although this AM it is at 8.2 mg/dL.  No reports of further bleeding at this time.  Plan: 1) Continue with Q8 hour CBC. 2) Advance to clear liquid diet. 3) Continue with Protonix drip and sucralfate.  LOS: 3 days   Younique Casad D 12/20/2010, 7:42 AM

## 2010-12-20 NOTE — Progress Notes (Signed)
Subjective: Patient doing okay. No complaints. Has small black bowel movement the night but no other problems. No abdominal pain.  Objective: Weight change:   Intake/Output Summary (Last 24 hours) at 12/20/10 1353 Last data filed at 12/20/10 0900  Gross per 24 hour  Intake   2275 ml  Output   2950 ml  Net   -675 ml   Filed Vitals:   12/20/10 0900  BP: 120/65  Pulse:   Temp: 97.8 F (36.6 C)  Resp: 16  General appearance: fatigued, no distress and mildly obese  Lungs: clear to auscultation bilaterally  Heart: regular rate and rhythm, S1, S2 normal, no murmur, click, rub or gallop  Abdomen: soft, non-tender; bowel sounds normal; no masses, no organomegaly  Extremities: extremities normal, atraumatic, no cyanosis or edema    Lab Results: Basic Metabolic Panel:  Basename 12/19/10 0530 12/18/10 0730  NA 141 141  Ford 3.8 3.6  CL 111 111  CO2 25 25  GLUCOSE 90 93  BUN 14 25*  CREATININE 0.45* 0.42*  CALCIUM 8.3* 8.1*  MG -- --  PHOS -- --   Liver Function Tests:  [facility] 12/17/10 2223  AST 11  ALT 11  ALKPHOS 42  BILITOT 0.1*  PROT 5.1*  ALBUMIN 2.7*   CBC:  Basename 12/20/10 0805 12/19/10 2320 12/17/10 2223  WBC 5.8 4.8 --  NEUTROABS -- -- 5.3  HGB 9.6* 8.2* --  HCT 29.0* 24.9* --  MCV 92.9 94.0 --  PLT 234 172 --  CBG:  Basename 12/19/10 1210 12/19/10 0742 12/19/10 0344 12/18/10 2308 12/18/10 2003 12/18/10 1705  GLUCAP 86 77 90 79 81 81    Micro Results: Recent Results (from the past 240 hour(s))  MRSA PCR SCREENING     Status: Normal   Collection Time   12/18/10 11:14 AM      Component Value Range Status Comment   MRSA by PCR NEGATIVE  NEGATIVE  Final     Medications: Scheduled Meds:    . acetaminophen  650 mg Oral Once  . diphenhydrAMINE  25 mg Intravenous Once  . sucralfate  1 g Oral TID WC & HS  . DISCONTD: insulin aspart  0-9 Units Subcutaneous Q4H   Continuous Infusions:    . dextrose 5 % and 0.9 % NaCl with KCl 40 mEq/L 100  mL/hr at 12/19/10 2037  . pantoprozole (PROTONIX) infusion 8 mg/hr (12/20/10 0803)   PRN Meds:.acetaminophen, acetaminophen, HYDROcodone-acetaminophen  Assessment/Plan: Patient Active Hospital Problem List: Anemia associated with acute blood loss (12/18/2010)   Assessment: Staying Stable, see below  GI bleed (12/18/2010)   Assessment: Continues to remain stable. Continue drips. Transfer to telemetry floor. Start clear liquid diet.  Diabetes mellitus (12/18/2010)  discontinued CBG checks   LOS: 3 days   [name] 12/20/2010, 1:53 PM

## 2010-12-21 LAB — CBC
HCT: 25.8 % — ABNORMAL LOW (ref 36.0–46.0)
Hemoglobin: 8.3 g/dL — ABNORMAL LOW (ref 12.0–15.0)
Hemoglobin: 9.4 g/dL — ABNORMAL LOW (ref 12.0–15.0)
MCH: 30.6 pg (ref 26.0–34.0)
MCH: 30.6 pg (ref 26.0–34.0)
MCHC: 32.2 g/dL (ref 30.0–36.0)
MCHC: 32.4 g/dL (ref 30.0–36.0)
MCV: 95.2 fL (ref 78.0–100.0)
MCV: 94.5 fL (ref 78.0–100.0)
RBC: 2.71 MIL/uL — ABNORMAL LOW (ref 3.87–5.11)
RBC: 3.07 MIL/uL — ABNORMAL LOW (ref 3.87–5.11)

## 2010-12-21 MED ORDER — OMEPRAZOLE MAGNESIUM 20 MG PO TBEC: 20.0000 mg | DELAYED_RELEASE_TABLET | Freq: Two times a day (BID) | ORAL | Status: DC

## 2010-12-21 MED ORDER — SUCRALFATE 1 GM/10ML PO SUSP: 1.0000 g | Freq: Four times a day (QID) | ORAL | Status: AC

## 2010-12-21 NOTE — Discharge Summary (Signed)
DISCHARGE SUMMARY  [name]  MR#: [id number]  DOB:[birth year]  Date of Admission: 12/17/2010 Date of Discharge: 12/22/2010  Attending Physician:Sarp Vernier K  Patient's WUJ:WJXBJYN,WGNF, MD  Consults:Treatment Team:  [facility]  Discharge Diagnoses: Present on Admission:  .GI bleed .Anemia associated with acute blood loss     Current Discharge Medication List    START taking these medications   Details  omeprazole (PRILOSEC OTC) 20 MG tablet Take 1 tablet (20 mg total) by mouth 2 (two) times daily. Qty: 60 tablet    sucralfate (CARAFATE) 1 GM/10ML suspension Take 10 mLs (1 g total) by mouth 4 (four) times daily. Qty: 420 mL, Refills: 0      CONTINUE these medications which have NOT CHANGED   Details  Calcium Carb-Cholecalciferol (CALCIUM 1000 + D PO) Take 1,000 mg by mouth daily.      Multiple Vitamins-Minerals (MULTIVITAMINS THER. W/MINERALS) TABS Take 1 tablet by mouth daily.        STOP taking these medications     CLINDAMYCIN HCL PO           Hospital Course: Present on Admission:  .GI bleed: Patient is a 50 year old white female past medical history of mild obesity who presented on 12/18/2010 with a four-day history of dizziness when standing. The day prior to admission she got up to go the bathroom became profoundly dizzy and passed out on the way back to that bedroom unwitnessed. She also notes some mild shortness of breath. When she tried to go to work on the day of admission she became dizzy and was brought to the emergency room.  She described a history of having black stools times several weeks and although this had improved in 48 hours prior to admission there is still some black stool. He was in the patient's hemoglobin was 8.5 on upon arrival to the ER and she was showing signs of sinus tachycardia with a borderline low blood pressure. GI was consult in for suspected upper GI bleed.  Patient underwent endoscopy several hours  later by GI and she was noted to have 2 lesser curvature ulcers one of the ulcers exhibiting a visible vessel. Placement of the first Hemoclip precipitated arterial spurting. By Was used before placement of the second Hemoclip. 2 applications were applied the did not progress to slow the bleeding.  Patient was then sent to the ICU for closer monitoring. She was continued on IV continuous protonic strip. Serial hemoglobins were ordered. Patient's hemoglobin stayed between 8 for the next few days. She is continued on IV fluids and made n.p.o. and occasionally still has some black stool, but her hemoglobin did not drop below 8 and her blood pressure and heart rate remained stable and steady. She was started on a clear liquid diet as of 12/20/2010. She tolerated this and again her labs were stable. 11/15, her hemoglobin was steady at 8.3 and her vitals remained stable. Her diet is being advanced. If he tolerates this well without any incidence, she'll be discharged home on 1116 on a regular diet with twice a day PPI and Carafate 4 times a day times one bottle.   .Anemia associated with acute blood loss: See above     Day of Discharge BP 119/64  Pulse 79  Temp(Src) 97.9 F (36.6 C) (Oral)  Resp 16  Ht 5\' 7"  (1.702 m)  Wt 119 kg (262 lb 5.6 oz)  BMI 41.09 kg/m2  SpO2 97%  Physical Exam: General appearance: Awake, alert and oriented,  no distress and mildly obese  Lungs: clear to auscultation bilaterally  Heart: regular rate and rhythm, S1, S2 normal, no murmur, click, rub or gallop  Abdomen: soft, non-tender; bowel sounds normal; no masses, no organomegaly  Extremities: extremities normal, atraumatic, no cyanosis or edema   Results for orders placed during the hospital encounter of 12/17/10 (from the past 24 hour(s))  CBC     Status: Abnormal   Collection Time   12/20/10  3:50 PM      Component Value Range   WBC 5.9  4.0 - 10.5 (K/uL)   RBC 2.91 (*) 3.87 - 5.11 (MIL/uL)   Hemoglobin 9.0  (*) 12.0 - 15.0 (g/dL)   HCT 16.1 (*) 09.6 - 46.0 (%)   MCV 93.1  78.0 - 100.0 (fL)   MCH 30.9  26.0 - 34.0 (pg)   MCHC 33.2  30.0 - 36.0 (g/dL)   RDW 04.5 (*) 40.9 - 15.5 (%)   Platelets 208  150 - 400 (K/uL)  CBC     Status: Abnormal   Collection Time   12/20/10 11:30 PM      Component Value Range   WBC 4.6  4.0 - 10.5 (K/uL)   RBC 2.70 (*) 3.87 - 5.11 (MIL/uL)   Hemoglobin 8.3 (*) 12.0 - 15.0 (g/dL)   HCT 81.1 (*) 91.4 - 46.0 (%)   MCV 94.4  78.0 - 100.0 (fL)   MCH 30.7  26.0 - 34.0 (pg)   MCHC 32.5  30.0 - 36.0 (g/dL)   RDW 78.2 (*) 95.6 - 15.5 (%)   Platelets 181  150 - 400 (K/uL)  CBC     Status: Abnormal   Collection Time   12/21/10  5:30 AM      Component Value Range   WBC 4.6  4.0 - 10.5 (K/uL)   RBC 2.71 (*) 3.87 - 5.11 (MIL/uL)   Hemoglobin 8.3 (*) 12.0 - 15.0 (g/dL)   HCT 21.3 (*) 08.6 - 46.0 (%)   MCV 95.2  78.0 - 100.0 (fL)   MCH 30.6  26.0 - 34.0 (pg)   MCHC 32.2  30.0 - 36.0 (g/dL)   RDW 57.8 (*) 46.9 - 15.5 (%)   Platelets 178  150 - 400 (K/uL)    Disposition: Improved, being discharged to home   Follow-up Appts: Discharge Orders    Future Appointments: Provider: Department: Dept Phone: Center:   01/18/2011 8:30 AM Larina Earthly, MD [facility] [phone] [facility]   01/18/2011 9:50 AM Valarie Merino, MD [facility] [phone] None   01/22/2011 10:00 AM [facility] [phone] [facility]   01/22/2011 10:45 AM Pryor Ochoa, MD [facility] [phone] [facility]     Future Orders Please Complete By Expires   Diet general      Increase activity slowly         Follow-up with Dr.Hung, GI in 2 weeks. She will follow up with Dr. Sheria Lang in her PCP in 2 weeks  Tests Needing Follow-up: None  Signed: Anita Laguna K 12/21/2010, 11:03 AM

## 2010-12-21 NOTE — Progress Notes (Signed)
Pt followed for progression of care as a benefit of [employer] insurance. [facility] contact information given to the pt if needed for post d/c issues. Brooke Bonito C. Chaunte Hornbeck, RN, CCM [phone])

## 2010-12-21 NOTE — Progress Notes (Signed)
  Subjective: The patient reports having 4 melenic bowel movements, but it was not the same consistency as her admission melena.  It seemed to have more of a brown color.  Objective: Vital signs in last 24 hours: Temp:  [97.7 F (36.5 C)-98 F (36.7 C)] 97.9 F (36.6 C) (11/15 0400) Resp:  [16-28] 28  (11/15 0400) BP: (117-121)/(57-65) 117/62 mmHg (11/15 0400) Last BM Date: 12/20/10  Intake/Output from previous day: 11/14 0701 - 11/15 0700 In: 3565 [P.O.:690; I.V.:2875] Out: 3200 [Urine:3200] Intake/Output this shift:    General appearance: alert and no distress Resp: clear to auscultation bilaterally Cardio: regular rate and rhythm, S1, S2 normal, no murmur, click, rub or gallop GI: soft, non-tender; bowel sounds normal; no masses,  no organomegaly Extremities: extremities normal, atraumatic, no cyanosis or edema  Lab Results:  Basename 12/21/10 0530 12/20/10 2330 12/20/10 1550  WBC 4.6 4.6 5.9  HGB 8.3* 8.3* 9.0*  HCT 25.8* 25.5* 27.1*  PLT 178 181 208   BMET  Basename 12/19/10 0530 12/18/10 0730  NA 141 141  K 3.8 3.6  CL 111 111  CO2 25 25  GLUCOSE 90 93  BUN 14 25*  CREATININE 0.45* 0.42*  CALCIUM 8.3* 8.1*   LFT No results found for this basename: PROT,ALBUMIN,AST,ALT,ALKPHOS,BILITOT,BILIDIR,IBILI in the last 72 hours PT/INR No results found for this basename: LABPROT:2,INR:2 in the last 72 hours Hepatitis Panel No results found for this basename: HEPBSAG,HCVAB,HEPAIGM,HEPBIGM in the last 72 hours C-Diff No results found for this basename: CDIFFTOX:3 in the last 72 hours Fecal Lactopherrin No results found for this basename: FECLLACTOFRN in the last 72 hours  Studies/Results: No results found.  Medications:  Scheduled:   . acetaminophen  650 mg Oral Once  . diphenhydrAMINE  25 mg Intravenous Once  . sucralfate  1 g Oral TID WC & HS   Continuous:   . dextrose 5 % and 0.9 % NaCl with KCl 40 mEq/L 100 mL/hr at 12/19/10 2037  . pantoprozole  (PROTONIX) infusion 8 mg/hr (12/21/10 1478)    Assessment/Plan: 1) Gastric ulcer with visible vessel s/p hemoclipping.   The patient's HGB remains stable.  It appears that her melena was secondary to old blood.  Plan: 1) Agree with the transfer to [facility]. 2) Advance to a regular diet. 3) Possibly D/C Home tomorrow with close follow up in my office. 4) Continue with Protonix drip for now.  Upon D/C she can be changed over to PO Protonix BID and maintained on sucralfate 1 gram QID. 5) Decrease CBC to Q12 hours.  LOS: 4 days   [name] D 12/21/2010, 7:28 AM

## 2010-12-22 LAB — CBC
HCT: 26.8 % — ABNORMAL LOW (ref 36.0–46.0)
MCH: 30.5 pg (ref 26.0–34.0)
MCV: 94 fL (ref 78.0–100.0)
Platelets: 223 10*3/uL (ref 150–400)
RDW: 17.3 % — ABNORMAL HIGH (ref 11.5–15.5)
WBC: 4.4 10*3/uL (ref 4.0–10.5)

## 2010-12-22 NOTE — Progress Notes (Signed)
  Subjective: No acute events.  The patient feels great.  Objective: Vital signs in last 24 hours: Temp:  [97.8 F (36.6 C)-98.1 F (36.7 C)] 98 F (36.7 C) (11/16 0716) Pulse Rate:  [74-93] 74  (11/16 0716) Resp:  [15-18] 16  (11/16 0716) BP: (105-127)/(68-78) 117/78 mmHg (11/16 0716) SpO2:  [94 %-100 %] 94 % (11/16 0716) Weight:  [117.9 kg (259 lb 14.8 oz)] 259 lb 14.8 oz (117.9 kg) (11/15 1507) Last BM Date: 12/22/10  Intake/Output from previous day: 11/15 0701 - 11/16 0700 In: 3525 [P.O.:600; I.V.:2925] Out: 1200 [Urine:1200] Intake/Output this shift:    General appearance: alert and no distress Resp: clear to auscultation bilaterally Cardio: regular rate and rhythm, S1, S2 normal, no murmur, click, rub or gallop GI: soft, non-tender; bowel sounds normal; no masses,  no organomegaly Extremities: extremities normal, atraumatic, no cyanosis or edema  Lab Results:  Basename 12/22/10 0520 12/21/10 1545 12/21/10 0530  WBC 4.4 5.3 4.6  HGB 8.7* 9.4* 8.3*  HCT 26.8* 29.0* 25.8*  PLT 223 256 178   BMET No results found for this basename: NA:3,K:3,CL:3,CO2:3,GLUCOSE:3,BUN:3,CREATININE:3,CALCIUM:3 in the last 72 hours LFT No results found for this basename: PROT,ALBUMIN,AST,ALT,ALKPHOS,BILITOT,BILIDIR,IBILI in the last 72 hours PT/INR No results found for this basename: LABPROT:2,INR:2 in the last 72 hours Hepatitis Panel No results found for this basename: HEPBSAG,HCVAB,HEPAIGM,HEPBIGM in the last 72 hours C-Diff No results found for this basename: CDIFFTOX:3 in the last 72 hours Fecal Lactopherrin No results found for this basename: FECLLACTOFRN in the last 72 hours  Studies/Results: No results found.  Medications:  Scheduled:   . acetaminophen  650 mg Oral Once  . diphenhydrAMINE  25 mg Intravenous Once  . sucralfate  1 g Oral TID WC & HS   Continuous:   . dextrose 5 % and 0.9 % NaCl with KCl 40 mEq/L 100 mL/hr at 12/22/10 0853  . pantoprozole (PROTONIX)  infusion 8 mg/hr (12/22/10 0700)    Assessment/Plan: 1) Gastric ulcer with visible vessel.   The patient has remained stable.  Okay for her to be discharged.  Plan: 1) PPI BID. 2) Sucralfate QID. 3) Follow up in the office in 2 weeks.  LOS: 5 days   Cobey Raineri D 12/22/2010, 10:02 AM

## 2010-12-22 NOTE — Progress Notes (Signed)
Interval History: [name] was admitted on 12/18/10 with a GI bleed.  For the details of her hospital scores, please see Dr. Chancy Milroy discharge summary, dictated 12/21/2010. She was essentially monitored overnight to ensure stability of her hemoglobin before a planned discharge for today. ROS: Stools are now yellow. No black or blood noted. No presyncopal type symptoms. She is anxious for discharge.   Objective: Vital signs in last 24 hours: Temp:  [97.8 F (36.6 C)-98.1 F (36.7 C)] 98 F (36.7 C) (11/16 0716) Pulse Rate:  [74-93] 74  (11/16 0716) Resp:  [15-18] 16  (11/16 0716) BP: (105-127)/(68-78) 117/78 mmHg (11/16 0716) SpO2:  [94 %-100 %] 94 % (11/16 0716) Weight:  [117.9 kg (259 lb 14.8 oz)] 259 lb 14.8 oz (117.9 kg) (11/15 1507) Weight change:  Last BM Date: 12/22/10  Intake/Output from previous day:  Intake/Output Summary (Last 24 hours) at 12/22/10 0945 Last data filed at 12/22/10 0700  Gross per 24 hour  Intake   3425 ml  Output   1200 ml  Net   2225 ml     Physical Exam:  Gen:  No acute distress. Cardiovascular:  Regular rate, and rhythm. No murmurs, rubs, or gallops. Respiratory: Lungs clear to auscultation bilaterally with good air movement. Gastrointestinal: Soft, nontender, nondistended with normal active bowel sounds. Extremities: Notable for venous insufficiency and dilated veins but no frank edema.   Lab Results: Results for orders placed during the hospital encounter of 12/17/10 (from the past 24 hour(s))  CBC     Status: Abnormal   Collection Time   12/21/10  3:45 PM      Component Value Range   WBC 5.3  4.0 - 10.5 (K/uL)   RBC 3.07 (*) 3.87 - 5.11 (MIL/uL)   Hemoglobin 9.4 (*) 12.0 - 15.0 (g/dL)   HCT 04.5 (*) 40.9 - 46.0 (%)   MCV 94.5  78.0 - 100.0 (fL)   MCH 30.6  26.0 - 34.0 (pg)   MCHC 32.4  30.0 - 36.0 (g/dL)   RDW 81.1 (*) 91.4 - 15.5 (%)   Platelets 256  150 - 400 (K/uL)  CBC     Status: Abnormal   Collection Time   12/22/10  5:20 AM      Component Value Range   WBC 4.4  4.0 - 10.5 (K/uL)   RBC 2.85 (*) 3.87 - 5.11 (MIL/uL)   Hemoglobin 8.7 (*) 12.0 - 15.0 (g/dL)   HCT 78.2 (*) 95.6 - 46.0 (%)   MCV 94.0  78.0 - 100.0 (fL)   MCH 30.5  26.0 - 34.0 (pg)   MCHC 32.5  30.0 - 36.0 (g/dL)   RDW 21.3 (*) 08.6 - 15.5 (%)   Platelets 223  150 - 400 (K/uL)   Recent Results (from the past 240 hour(s))  MRSA PCR SCREENING     Status: Normal   Collection Time   12/18/10 11:14 AM      Component Value Range Status Comment   MRSA by PCR NEGATIVE  NEGATIVE  Final     Studies/Results: No results found.  Medications: Scheduled Meds:   . acetaminophen  650 mg Oral Once  . diphenhydrAMINE  25 mg Intravenous Once  . sucralfate  1 g Oral TID WC & HS   Continuous Infusions:   . dextrose 5 % and 0.9 % NaCl with KCl 40 mEq/L 100 mL/hr at 12/22/10 0853  . pantoprozole (PROTONIX) infusion 8 mg/hr (12/22/10 0700)   PRN Meds:.acetaminophen, acetaminophen, HYDROcodone-acetaminophen  Assessment/Plan:  GI bleed:  -Patient is a 50 year old white female past medical history of mild obesity who presented on 12/18/2010 with a four-day history of dizziness when standing. The day prior to admission she got up to go the bathroom became profoundly dizzy and passed out on the way back to that bedroom unwitnessed. She also notes some mild shortness of breath. When she tried to go to work on the day of admission she became dizzy and was brought to the emergency room.  She described a history of having black stools times several weeks and although this had improved in 48 hours prior to admission there is still some black stool. He was in the patient's hemoglobin was 8.5 on upon arrival to the ER and she was showing signs of sinus tachycardia with a borderline low blood pressure. GI was consult in for suspected upper GI bleed.  Patient underwent endoscopy several hours later by GI and she was noted to have 2 lesser curvature ulcers  one of the ulcers exhibiting a visible vessel. Placement of the first Hemoclip precipitated arterial spurting. Bovey cauterization was used before placement of the second Hemoclip.Patient was then sent to the ICU for closer monitoring. She was continued on IV continuous protonic strip. Serial hemoglobins were ordered. Patient's hemoglobin stayed between 8 for the next few days. She is continued on IV fluids and made n.p.o. and occasionally still has some black stool, but her hemoglobin did not drop below 8 and her blood pressure and heart rate remained stable and steady. She was started on a clear liquid diet as of 12/20/2010. She tolerated this and again her labs were stable. 11/15, her hemoglobin was steady at 8.3 and her vitals remained stable. Her diet was advanced which she tolerated well and her discharge hemoglobin is stable at 8.7.  -Anemia associated with acute blood loss: See above        LOS: 5 days   Hillery Aldo, MD Pager [phone]  12/22/2010, 9:45 AM

## 2010-12-31 ENCOUNTER — Encounter (HOSPITAL_COMMUNITY): Payer: Self-pay | Admitting: Internal Medicine

## 2011-01-17 ENCOUNTER — Encounter (INDEPENDENT_AMBULATORY_CARE_PROVIDER_SITE_OTHER): Payer: Self-pay | Admitting: Surgery

## 2011-01-17 ENCOUNTER — Encounter: Payer: Self-pay | Admitting: Vascular Surgery

## 2011-01-18 ENCOUNTER — Encounter (INDEPENDENT_AMBULATORY_CARE_PROVIDER_SITE_OTHER): Payer: Self-pay | Admitting: Surgery

## 2011-01-18 ENCOUNTER — Encounter: Payer: Self-pay | Admitting: Vascular Surgery

## 2011-01-18 ENCOUNTER — Ambulatory Visit (INDEPENDENT_AMBULATORY_CARE_PROVIDER_SITE_OTHER): Payer: Commercial Managed Care - PPO | Admitting: Surgery

## 2011-01-18 ENCOUNTER — Ambulatory Visit (INDEPENDENT_AMBULATORY_CARE_PROVIDER_SITE_OTHER): Payer: 59 | Admitting: Vascular Surgery

## 2011-01-18 VITALS — BP 138/75 | HR 86 | Resp 20 | Ht 66.0 in | Wt 256.0 lb

## 2011-01-18 VITALS — BP 136/72 | HR 66 | Temp 97.2°F | Resp 18 | Ht 67.0 in | Wt 267.6 lb

## 2011-01-18 DIAGNOSIS — K9509 Other complications of gastric band procedure: Secondary | ICD-10-CM | POA: Insufficient documentation

## 2011-01-18 DIAGNOSIS — I83893 Varicose veins of bilateral lower extremities with other complications: Secondary | ICD-10-CM

## 2011-01-18 NOTE — Patient Instructions (Signed)

## 2011-01-18 NOTE — Progress Notes (Signed)
Laser Ablation Procedure      Date: 01/18/2011    [name] DOB:Dec 15, 1960  Consent signed: Yes  Surgeon:T.F. Phoenix Dresser  Procedure: Laser Ablation: left Greater Saphenous Vein  BP 138/75  Pulse 86  Resp 20  Ht 5\' 6"  (1.676 m)  Wt 256 lb (116.121 kg)  BMI 41.32 kg/m2  Start time: 8:30AM   End time: 10:00AM  Tumescent Anesthesia: 300 cc 0.9% NaCl with 50 cc Lidocaine HCL with 1% Epi and 15 cc 8.4% NaHCO3  Local Anesthesia: 3 cc Lidocaine HCL and NaHCO3 (ratio 2:1)  Continuous Mode: 15 Watts Total Energy 1487 Joules Total Time0:98       Patient tolerated procedure well: Yes  Rankin, Neena Rhymes  Description of Procedure:  After marking the course of the saphenous vein and the secondary varicosities in the standing position, the patient was placed on the operating table in the supine position, and the left leg was prepped and draped in sterile fashion. Local anesthetic was administered, and under ultrasound guidance the saphenous vein was accessed with a micro needle and guide wire; then the micro puncture sheath was placed. A guide wire was inserted to the saphenofemoral junction, followed by a 5 french sheath.  The position of the sheath and then the laser fiber below the junction was confirmed using the ultrasound and visualization of the aiming beam.  Tumescent anesthesia was administered along the course of the saphenous vein using ultrasound guidance. Protective laser glasses were placed on the patient, and the laser was fired at at 15 watt continuous mode.  For a total of 1487 joules.  A steri strip was applied to the puncture site.      ABD pads and thigh high compression stockings were applied.  Ace wrap bandages were applied at the top of the saphenofemoral junction.  Blood loss was less than 15 cc.  The patient ambulated out of the operating room having tolerated the procedure well.  Patient underwent uneventful great saphenous vein ablation she did have webbing  from her prior ablation several years ago. She had a fiber placed from the knee to mid thigh and then from midthigh up to just below the saphenofemoral junction. She had no immediate complications and was discharged home will be seen again in one week for ultrasound followup

## 2011-01-18 NOTE — Progress Notes (Signed)
[name] comes in today after [name] repaired her anterior slip back in October. Subsequently she had a significant GI bleed from was felt to be a gastric ulcer below her band. It might be that that was related to her slip. Today her hemoglobin is in the 12 range and she is doing well. She wanted to get refill. She had zero fluid in her band. I went ahead and initially admitted 6 cc back that proved to be too much and I cut that back to him that of 4 cc. She tolerated that well. We will see her again in the office in about 6 weeks to consider her for further band adjustment. Her incision in her umbilicus is healing nicely. She is a postop and it was considered as such. Plan return in 6 weeks

## 2011-01-19 ENCOUNTER — Encounter: Payer: Self-pay | Admitting: Vascular Surgery

## 2011-01-19 ENCOUNTER — Telehealth: Payer: Self-pay | Admitting: *Deleted

## 2011-01-19 NOTE — Telephone Encounter (Signed)
01/19/2011  Time: 4:41 PM   Patient Name: [name]  Patient of: T.F. Early  Procedure:Laser Ablation left    01-18-2011  Reached patient at home and checked  Her status  Yes    Comments/Actions Taken: Mrs. Louischarles states no problems with pain or swelling.  She states she is experiencing the compression dressing rolling down over the top of her left thigh.  She will attempt to use Spanx legging over the top of the compression dressing to hold it in place.      @SIGNATURE @

## 2011-01-22 ENCOUNTER — Encounter: Payer: Self-pay | Admitting: Vascular Surgery

## 2011-01-22 ENCOUNTER — Other Ambulatory Visit (INDEPENDENT_AMBULATORY_CARE_PROVIDER_SITE_OTHER): Payer: Commercial Managed Care - PPO | Admitting: *Deleted

## 2011-01-22 ENCOUNTER — Ambulatory Visit (INDEPENDENT_AMBULATORY_CARE_PROVIDER_SITE_OTHER): Payer: Commercial Managed Care - PPO | Admitting: Vascular Surgery

## 2011-01-22 VITALS — BP 136/64 | HR 79 | Resp 18 | Ht 66.0 in | Wt 261.0 lb

## 2011-01-22 DIAGNOSIS — Z48812 Encounter for surgical aftercare following surgery on the circulatory system: Secondary | ICD-10-CM

## 2011-01-22 DIAGNOSIS — I83893 Varicose veins of bilateral lower extremities with other complications: Secondary | ICD-10-CM

## 2011-01-22 NOTE — Progress Notes (Signed)
Subjective:     Patient ID: [name], female   DOB: 03-02-1960, 50 y.o.   MRN: [id number]  HPI this 50 year old female had laser ablation of the left great saphenous vein by Dr. early one week ago for painful varicosities and venous hypertension. She had previously had laser ablation of the left great saphenous vein performed in 2010 and eventually developed some recanalization. She has had no significant pain in the thigh or calf since this procedure was performed. Her husband did note that there was decreased swelling in the lower leg however. She is wearing her elastic compression stocking.  Past Medical History  Diagnosis Date  . Leg swelling   . Difficulty in swallowing   . Arthritis   . Bruises easily   . Reflux   . Shortness of breath   . Blood transfusion   . GERD (gastroesophageal reflux disease)   . Peripheral vascular disease   . Anemia   . Diabetes mellitus     Pt had DM prior to Lap Band. Does not have it now per pt.  . Bleeding ulcer 12-2010  hospitalized for 1 week    History  Substance Use Topics  . Smoking status: Never Smoker   . Smokeless tobacco: Never Used  . Alcohol Use: No    Family History  Problem Relation Age of Onset  . Heart disease Father   . Kidney disease Father   . Peripheral vascular disease Father   . Diabetes Mother   . Arthritis Mother     Allergies  Allergen Reactions  . Ancef (Cefazolin Sodium)   . Contrast Media (Iodinated Diagnostic Agents)     apnea  . Penicillins Other (See Comments)    Patient does not recall the reaction, it happened when she was a child    Current outpatient prescriptions:Calcium Carb-Cholecalciferol (CALCIUM 1000 + D PO), Take 1,000 mg by mouth daily.  , Disp: , Rfl: ;  Multiple Vitamins-Minerals (MULTIVITAMINS THER. W/MINERALS) TABS, Take 1 tablet by mouth daily.  , Disp: , Rfl: ;  omeprazole (PRILOSEC OTC) 20 MG tablet, Take 1 tablet (20 mg total) by mouth 2 (two) times daily., Disp: 60 tablet, Rfl:    BP 136/64  Pulse 79  Resp 18  Ht 5\' 6"  (1.676 m)  Wt 261 lb (118.389 kg)  BMI 42.13 kg/m2  Body mass index is 42.13 kg/(m^2).         Review of Systems denies chest pain, dyspnea on exertion, PND, orthopnea, hemoptysis     Objective:   Physical Exam pressure 136/64 heart rate 79 respirations 18 Lungs no rhonchi or wheezing Lower extremity exam reveals mild tenderness along the course of the great saphenous vein in the left leg beginning at the knee and extending up to the proximal thigh. She has 3+ dorsalis pedis pulse palpable. There are some visible varicosities in the medial calf below the knee.  Today I ordered a venous duplex exam which I reviewed and interpreted. There is no DVT. The left greater saphenous vein is thrombosed from the knee to the proximal thigh. The proximal GSV is patent but no reflux is present.     Assessment:    successful laser ablation left great saphenous vein for venous hypertension and painful varicosities    Plan:     Return to see Dr. early on a when necessary basis Have recommended that she wear short leg elastic stockings on a chronic basis

## 2011-01-31 NOTE — Procedures (Unsigned)
DUPLEX DEEP VENOUS EXAM - LOWER EXTREMITY  INDICATION:  Follow up left GSV RFA.  HISTORY:  Edema:  No. Trauma/Surgery:  Yes. Pain:  Tenderness. PE:  No. Previous DVT:  No. Anticoagulants:  No. Other:  DUPLEX EXAM:               CFV   SFV   PopV  PTV    GSV               R  L  R  L  R  L  R   L  R  L Thrombosis       o     o     o      o     + Spontaneous      +     +     +      +     0 Phasic           +     +     +      +     0 Augmentation     +     +     +      +     o Compressible     +     +     +      +     0 Competent  Legend:  + - yes  o - no  p - partial  D - decreased  IMPRESSION: 1. Left greater saphenous vein is thrombosed from proximal to mid     thigh through the knee. 2. Left saphenofemoral junction and proximal greater saphenous vein     are patent without recurrent significant reflux.   _____________________________ [name]. Hart Rochester, M.D. LT/MEDQ  D:  01/22/2011  T:  01/22/2011  Job:  [id number]

## 2011-02-14 ENCOUNTER — Encounter: Payer: Self-pay | Admitting: Vascular Surgery

## 2011-03-02 ENCOUNTER — Ambulatory Visit (INDEPENDENT_AMBULATORY_CARE_PROVIDER_SITE_OTHER): Payer: Commercial Managed Care - PPO | Admitting: Physician Assistant

## 2011-03-02 ENCOUNTER — Encounter (INDEPENDENT_AMBULATORY_CARE_PROVIDER_SITE_OTHER): Payer: Self-pay

## 2011-03-02 VITALS — BP 136/86 | Ht 67.0 in | Wt 270.2 lb

## 2011-03-02 DIAGNOSIS — Z4651 Encounter for fitting and adjustment of gastric lap band: Secondary | ICD-10-CM

## 2011-03-02 NOTE — Progress Notes (Signed)
  HISTORY: [name] is a 51 y.o.female who received an AP-large lap-band in November 2010 by Dr. Daphine Deutscher. She had a slip that was treated in October. Her current fill volume is 4 mL but it isn't sufficient to give her any restriction. She denies vomiting or reflux.  VITAL SIGNS: Filed Vitals:   03/02/11 1132  BP: 136/86    PHYSICAL EXAM: Physical exam reveals a very well-appearing 51 y.o.female in no apparent distress Neurologic: Awake, alert, oriented Psych: Bright affect, conversant Respiratory: Breathing even and unlabored. No stridor or wheezing Abdomen: Soft, nontender, nondistended to palpation. Incisions well-healed. No incisional hernias. Port easily palpated. Extremities: Atraumatic, good range of motion.  ASSESMENT: 51 y.o.  female  s/p AP-Large lap-band.   PLAN: The patient's port was accessed with a 20G Huber needle without difficulty. Clear fluid was aspirated and 1.5 mL saline was added to the port to give a total predicted volume of 5.5 mL. The patient was able to swallow water without difficulty following the procedure and was instructed to take clear liquids for the next 24-48 hours and advance slowly as tolerated.

## 2011-03-02 NOTE — Patient Instructions (Signed)
Take clear liquids for the next 48 hours. Thin protein shakes are ok to start on Saturday evening. You may begin foods with the consistency of yogurt, cottage cheese, cream soups, etc. on Sunday. Call us if you have persistent vomiting or regurgitation, night cough or reflux symptoms. Return as scheduled or sooner if you notice no changes in hunger/portion sizes.   

## 2011-04-19 ENCOUNTER — Encounter (INDEPENDENT_AMBULATORY_CARE_PROVIDER_SITE_OTHER): Payer: Self-pay

## 2011-04-19 ENCOUNTER — Ambulatory Visit (INDEPENDENT_AMBULATORY_CARE_PROVIDER_SITE_OTHER): Payer: Commercial Managed Care - PPO | Admitting: Physician Assistant

## 2011-04-19 VITALS — BP 130/88 | HR 76 | Temp 97.8°F | Resp 16 | Ht 67.0 in | Wt 277.4 lb

## 2011-04-19 DIAGNOSIS — Z4651 Encounter for fitting and adjustment of gastric lap band: Secondary | ICD-10-CM

## 2011-04-19 NOTE — Patient Instructions (Signed)
Take clear liquids tonight. Thin protein shakes are ok to start tomorrow morning. Slowly advance your diet thereafter. Call us if you have persistent vomiting or regurgitation, night cough or reflux symptoms. Return as scheduled or sooner if you notice no changes in hunger/portion sizes.  

## 2011-04-19 NOTE — Progress Notes (Signed)
  HISTORY: [name] is a 51 y.o.female who received an AP-Large lap-band in November 2010 by Dr. Daphine Deutscher. She comes in after having fluid removed for a slip and is now up to 5.23mL in fill volume. She still has hunger, large portion sizes and weight gain. No untoward symptoms however.  VITAL SIGNS: Filed Vitals:   04/19/11 0900  BP: 130/88  Pulse: 76  Temp: 97.8 F (36.6 C)  Resp: 16    PHYSICAL EXAM: Physical exam reveals a very well-appearing 51 y.o.female in no apparent distress Neurologic: Awake, alert, oriented Psych: Bright affect, conversant Respiratory: Breathing even and unlabored. No stridor or wheezing Abdomen: Soft, nontender, nondistended to palpation. Incisions well-healed. No incisional hernias. Port easily palpated. Extremities: Atraumatic, good range of motion.  ASSESMENT: 51 y.o.  female  s/p AP-Large lap-band.   PLAN: The patient's port was accessed with a 20G Huber needle without difficulty. Clear fluid was aspirated and 1.5 mL saline was added to the port to give a total predicted volume of 7 mL. The patient was able to swallow water without difficulty following the procedure and was instructed to take clear liquids for the next 24-48 hours and advance slowly as tolerated.

## 2011-05-31 ENCOUNTER — Encounter (INDEPENDENT_AMBULATORY_CARE_PROVIDER_SITE_OTHER): Payer: Commercial Managed Care - PPO

## 2011-07-10 ENCOUNTER — Encounter (INDEPENDENT_AMBULATORY_CARE_PROVIDER_SITE_OTHER): Payer: Self-pay | Admitting: Surgery

## 2011-07-12 ENCOUNTER — Encounter (INDEPENDENT_AMBULATORY_CARE_PROVIDER_SITE_OTHER): Payer: Self-pay

## 2011-07-12 ENCOUNTER — Ambulatory Visit (INDEPENDENT_AMBULATORY_CARE_PROVIDER_SITE_OTHER): Payer: Commercial Managed Care - PPO | Admitting: Physician Assistant

## 2011-07-12 VITALS — BP 129/83 | Ht 67.0 in | Wt 274.8 lb

## 2011-07-12 DIAGNOSIS — Z4651 Encounter for fitting and adjustment of gastric lap band: Secondary | ICD-10-CM

## 2011-07-12 NOTE — Patient Instructions (Signed)
Take clear liquids tonight. Thin protein shakes are ok to start tomorrow morning. Slowly advance your diet thereafter. Call us if you have persistent vomiting or regurgitation, night cough or reflux symptoms. Return as scheduled or sooner if you notice no changes in hunger/portion sizes.  

## 2011-07-12 NOTE — Progress Notes (Signed)
  HISTORY: LARRY KNIPP is a 51 y.o.female who received an AP-Large lap-band in November 2010 with a repair of slip in November 2012 by Dr. Daphine Deutscher. She comes in having last been seen in March for a fill and is having persistent hunger and larger portion sizes. Fortunately, she is having no untoward symptoms including regurgitation or reflux. She would like a fill today.  VITAL SIGNS: Filed Vitals:   07/12/11 0857  BP: 129/83    PHYSICAL EXAM: Physical exam reveals a very well-appearing 51 y.o.female in no apparent distress Neurologic: Awake, alert, oriented Psych: Bright affect, conversant Respiratory: Breathing even and unlabored. No stridor or wheezing Abdomen: Soft, nontender, nondistended to palpation. Incisions well-healed. No incisional hernias. Port easily palpated. Extremities: Atraumatic, good range of motion.  ASSESMENT: 51 y.o.  female  s/p AP-Large lap-band.   PLAN: The patient's port was accessed with a 20G Huber needle without difficulty. Clear fluid was aspirated and 1 mL saline was added to the port to give a total predicted volume of 8 mL. The patient was able to swallow water without difficulty following the procedure and was instructed to take clear liquids for the next 24-48 hours and advance slowly as tolerated.

## 2011-07-24 ENCOUNTER — Encounter: Payer: Self-pay | Admitting: Internal Medicine

## 2011-08-30 ENCOUNTER — Encounter: Payer: Commercial Managed Care - PPO | Admitting: Internal Medicine

## 2011-10-04 ENCOUNTER — Encounter (INDEPENDENT_AMBULATORY_CARE_PROVIDER_SITE_OTHER): Payer: Commercial Managed Care - PPO

## 2012-06-05 ENCOUNTER — Encounter (INDEPENDENT_AMBULATORY_CARE_PROVIDER_SITE_OTHER): Payer: Commercial Managed Care - PPO

## 2012-06-12 ENCOUNTER — Encounter (INDEPENDENT_AMBULATORY_CARE_PROVIDER_SITE_OTHER): Payer: Commercial Managed Care - PPO

## 2012-07-24 ENCOUNTER — Encounter (INDEPENDENT_AMBULATORY_CARE_PROVIDER_SITE_OTHER): Payer: Commercial Managed Care - PPO

## 2012-07-31 ENCOUNTER — Encounter (INDEPENDENT_AMBULATORY_CARE_PROVIDER_SITE_OTHER): Payer: Self-pay

## 2012-07-31 ENCOUNTER — Ambulatory Visit (INDEPENDENT_AMBULATORY_CARE_PROVIDER_SITE_OTHER): Payer: Commercial Managed Care - PPO | Admitting: Physician Assistant

## 2012-07-31 VITALS — BP 124/82 | HR 76 | Temp 97.4°F | Resp 16 | Ht 66.0 in | Wt 276.0 lb

## 2012-07-31 DIAGNOSIS — Z4651 Encounter for fitting and adjustment of gastric lap band: Secondary | ICD-10-CM

## 2012-07-31 NOTE — Patient Instructions (Signed)
Take clear liquids tonight. Thin protein shakes are ok to start tomorrow morning. Slowly advance your diet thereafter. Call us if you have persistent vomiting or regurgitation, night cough or reflux symptoms. Return as scheduled or sooner if you notice no changes in hunger/portion sizes.  

## 2012-07-31 NOTE — Progress Notes (Signed)
  HISTORY: RIDA LOUDIN is a 52 y.o.female who received an AP-Large lap-band in November 2010 by Dr. Daphine Deutscher. She comes in with stable weight since her last visit one year ago. She describes her weight having dropped to 260 after her last fill but it has steadily increased afterwards. She denies regurgitation or reflux but she does report an increase in hunger and portion sizes. She would like a fill today to get her weight back down.  VITAL SIGNS: Filed Vitals:   07/31/12 0912  BP: 124/82  Pulse: 76  Temp: 97.4 F (36.3 C)  Resp: 16    PHYSICAL EXAM: Physical exam reveals a very well-appearing 52 y.o.female in no apparent distress Neurologic: Awake, alert, oriented Psych: Bright affect, conversant Respiratory: Breathing even and unlabored. No stridor or wheezing Abdomen: Soft, nontender, nondistended to palpation. Incisions well-healed. No incisional hernias. Port easily palpated. Extremities: Atraumatic, good range of motion.  ASSESMENT: 52 y.o.  female  s/p AP-large lap-band.   PLAN: The patient's port was accessed with a 20G Huber needle without difficulty. Clear fluid was aspirated and 0.5 mL saline was added to the port to give a total predicted volume of 8.5 mL. The patient was able to swallow water without difficulty following the procedure and was instructed to take clear liquids for the next 24-48 hours and advance slowly as tolerated. I asked her to return in three months or sooner if she notices her weight going back up after dropping. She voiced agreement.

## 2012-10-30 ENCOUNTER — Encounter (INDEPENDENT_AMBULATORY_CARE_PROVIDER_SITE_OTHER): Payer: Commercial Managed Care - PPO

## 2012-11-20 ENCOUNTER — Encounter (INDEPENDENT_AMBULATORY_CARE_PROVIDER_SITE_OTHER): Payer: Self-pay

## 2012-11-20 ENCOUNTER — Ambulatory Visit (INDEPENDENT_AMBULATORY_CARE_PROVIDER_SITE_OTHER): Payer: Commercial Managed Care - PPO | Admitting: Physician Assistant

## 2012-11-20 VITALS — BP 132/96 | HR 80 | Temp 97.6°F | Resp 16 | Ht 67.0 in | Wt 270.6 lb

## 2012-11-20 DIAGNOSIS — Z4651 Encounter for fitting and adjustment of gastric lap band: Secondary | ICD-10-CM

## 2012-11-20 NOTE — Patient Instructions (Signed)

## 2012-11-20 NOTE — Progress Notes (Signed)
  HISTORY: [name] is a 52 y.o.female who received an AP-Large lap-band in November 2010 by Dr. Daphine Deutscher. She comes in with 5 lbs weight loss but her weight was 5 lbs lower last week. She has noticed over the past month that her portion sizes have increased but her hunger hasn't changed considerably. She has no complaints of regurgitation or reflux. She has re-engaged with an exercise program as her overall feeling of well-being improves with physical activity. She wants a fill today to get the portion sizes under control.  VITAL SIGNS: Filed Vitals:   11/20/12 1437  BP: 132/96  Pulse: 80  Temp: 97.6 F (36.4 C)  Resp: 16    PHYSICAL EXAM: Physical exam reveals a very well-appearing 52 y.o.female in no apparent distress Neurologic: Awake, alert, oriented Psych: Bright affect, conversant Respiratory: Breathing even and unlabored. No stridor or wheezing Abdomen: Soft, nontender, nondistended to palpation. Incisions well-healed. No incisional hernias. Port easily palpated. Extremities: Atraumatic, good range of motion.  ASSESMENT: 52 y.o.  female  s/p AP-Large lap-band.   PLAN: The patient's port was accessed with a 20G Huber needle without difficulty. Clear fluid was aspirated and 0.25 mL saline was added to the port to give a total predicted volume of 8.75 mL. The patient was able to swallow water without difficulty following the procedure and was instructed to take clear liquids for the next 24-48 hours and advance slowly as tolerated.

## 2012-12-11 ENCOUNTER — Other Ambulatory Visit: Payer: Self-pay

## 2013-01-22 ENCOUNTER — Other Ambulatory Visit: Payer: Self-pay | Admitting: Obstetrics and Gynecology

## 2013-01-22 DIAGNOSIS — R928 Other abnormal and inconclusive findings on diagnostic imaging of breast: Secondary | ICD-10-CM

## 2013-01-30 ENCOUNTER — Other Ambulatory Visit: Payer: Self-pay | Admitting: Obstetrics and Gynecology

## 2013-01-30 ENCOUNTER — Ambulatory Visit
Admission: RE | Admit: 2013-01-30 | Discharge: 2013-01-30 | Disposition: A | Discharge status: Home / Self Care | Payer: 59 | Source: Ambulatory Visit | Attending: Obstetrics and Gynecology | Admitting: Obstetrics and Gynecology

## 2013-01-30 DIAGNOSIS — N6310 Unspecified lump in the right breast, unspecified quadrant: Secondary | ICD-10-CM

## 2013-01-30 DIAGNOSIS — R928 Other abnormal and inconclusive findings on diagnostic imaging of breast: Secondary | ICD-10-CM

## 2013-02-05 DIAGNOSIS — Z86711 Personal history of pulmonary embolism: Secondary | ICD-10-CM | POA: Insufficient documentation

## 2013-02-11 ENCOUNTER — Ambulatory Visit
Admission: RE | Admit: 2013-02-11 | Discharge: 2013-02-11 | Disposition: A | Discharge status: Home / Self Care | Payer: 59 | Source: Ambulatory Visit | Attending: Obstetrics and Gynecology | Admitting: Obstetrics and Gynecology

## 2013-02-11 ENCOUNTER — Other Ambulatory Visit: Payer: Self-pay | Admitting: Obstetrics and Gynecology

## 2013-02-11 ENCOUNTER — Other Ambulatory Visit: Payer: Self-pay | Admitting: Gastroenterology

## 2013-02-11 DIAGNOSIS — N6310 Unspecified lump in the right breast, unspecified quadrant: Secondary | ICD-10-CM

## 2013-02-26 ENCOUNTER — Ambulatory Visit (INDEPENDENT_AMBULATORY_CARE_PROVIDER_SITE_OTHER): Payer: Commercial Managed Care - PPO | Admitting: Physician Assistant

## 2013-02-26 ENCOUNTER — Encounter (INDEPENDENT_AMBULATORY_CARE_PROVIDER_SITE_OTHER): Payer: Self-pay | Admitting: Physician Assistant

## 2013-02-26 VITALS — BP 150/82 | HR 68 | Temp 97.6°F | Resp 18 | Ht 67.0 in | Wt 273.1 lb

## 2013-02-26 DIAGNOSIS — Z4651 Encounter for fitting and adjustment of gastric lap band: Secondary | ICD-10-CM

## 2013-02-26 NOTE — Patient Instructions (Signed)

## 2013-02-26 NOTE — Progress Notes (Signed)
  HISTORY: [name] is a 52 y.o.female who received an AP-Large lap-band in November 2010 by Dr. Hassell Done. She comes in with 2.5 lbs weight gain since her last visit in October. She reports no change in hunger since then but she does note her appetite is more than she'd desire. She had a decrease in her exercise but is re-engaging with the [employer] program this month. She denies regurgitation or reflux symptoms. She would like a fill today.  VITAL SIGNS: Filed Vitals:   02/26/13 0915  BP: 150/82  Pulse: 68  Temp: 97.6 F (36.4 C)  Resp: 18    PHYSICAL EXAM: Physical exam reveals a very well-appearing 53 y.o.female in no apparent distress Neurologic: Awake, alert, oriented Psych: Bright affect, conversant Respiratory: Breathing even and unlabored. No stridor or wheezing Abdomen: Soft, nontender, nondistended to palpation. Incisions well-healed. No incisional hernias. Port easily palpated. Extremities: Atraumatic, good range of motion.  ASSESMENT: 53 y.o.  female  s/p AP-Large lap-band.   PLAN: The patient's port was accessed with a 20G Huber needle without difficulty. Clear fluid was aspirated and 0.5 mL saline was added to the port to give a total predicted volume of 9.25 mL. The patient was able to swallow water without difficulty following the procedure and was instructed to take clear liquids for the next 24-48 hours and advance slowly as tolerated.

## 2013-03-24 ENCOUNTER — Encounter (HOSPITAL_COMMUNITY): Payer: Self-pay | Admitting: *Deleted

## 2013-03-24 DIAGNOSIS — M7989 Other specified soft tissue disorders: Secondary | ICD-10-CM

## 2013-03-24 DIAGNOSIS — R1310 Dysphagia, unspecified: Secondary | ICD-10-CM

## 2013-03-24 DIAGNOSIS — R1110 Vomiting, unspecified: Secondary | ICD-10-CM

## 2013-03-24 HISTORY — DX: Dysphagia, unspecified: R13.10

## 2013-03-24 HISTORY — DX: Vomiting, unspecified: R11.10

## 2013-03-24 HISTORY — DX: Other specified soft tissue disorders: M79.89

## 2013-03-31 ENCOUNTER — Ambulatory Visit (INDEPENDENT_AMBULATORY_CARE_PROVIDER_SITE_OTHER): Payer: Self-pay | Admitting: Family Medicine

## 2013-03-31 DIAGNOSIS — Z713 Dietary counseling and surveillance: Secondary | ICD-10-CM

## 2013-04-10 ENCOUNTER — Encounter (HOSPITAL_COMMUNITY): Payer: Self-pay | Admitting: *Deleted

## 2013-04-10 ENCOUNTER — Ambulatory Visit (HOSPITAL_COMMUNITY)
Admission: RE | Admit: 2013-04-10 | Discharge: 2013-04-10 | Disposition: A | Discharge status: Home / Self Care | Payer: 59 | Source: Ambulatory Visit | Attending: Gastroenterology | Admitting: Gastroenterology

## 2013-04-10 ENCOUNTER — Ambulatory Visit (HOSPITAL_COMMUNITY): Payer: 59 | Admitting: Anesthesiology

## 2013-04-10 ENCOUNTER — Encounter (HOSPITAL_COMMUNITY)
Admission: RE | Disposition: A | Discharge status: Home / Self Care | Payer: Self-pay | Source: Ambulatory Visit | Attending: Gastroenterology

## 2013-04-10 ENCOUNTER — Encounter (HOSPITAL_COMMUNITY): Payer: 59 | Admitting: Anesthesiology

## 2013-04-10 DIAGNOSIS — Z880 Allergy status to penicillin: Secondary | ICD-10-CM | POA: Insufficient documentation

## 2013-04-10 DIAGNOSIS — Z9089 Acquired absence of other organs: Secondary | ICD-10-CM | POA: Insufficient documentation

## 2013-04-10 DIAGNOSIS — Z8711 Personal history of peptic ulcer disease: Secondary | ICD-10-CM | POA: Insufficient documentation

## 2013-04-10 DIAGNOSIS — Z8639 Personal history of other endocrine, nutritional and metabolic disease: Secondary | ICD-10-CM | POA: Insufficient documentation

## 2013-04-10 DIAGNOSIS — Z862 Personal history of diseases of the blood and blood-forming organs and certain disorders involving the immune mechanism: Secondary | ICD-10-CM | POA: Insufficient documentation

## 2013-04-10 DIAGNOSIS — Z883 Allergy status to other anti-infective agents status: Secondary | ICD-10-CM | POA: Insufficient documentation

## 2013-04-10 DIAGNOSIS — I739 Peripheral vascular disease, unspecified: Secondary | ICD-10-CM | POA: Insufficient documentation

## 2013-04-10 DIAGNOSIS — K648 Other hemorrhoids: Secondary | ICD-10-CM | POA: Insufficient documentation

## 2013-04-10 DIAGNOSIS — K219 Gastro-esophageal reflux disease without esophagitis: Secondary | ICD-10-CM | POA: Insufficient documentation

## 2013-04-10 DIAGNOSIS — K644 Residual hemorrhoidal skin tags: Secondary | ICD-10-CM | POA: Insufficient documentation

## 2013-04-10 DIAGNOSIS — D126 Benign neoplasm of colon, unspecified: Secondary | ICD-10-CM | POA: Insufficient documentation

## 2013-04-10 DIAGNOSIS — E785 Hyperlipidemia, unspecified: Secondary | ICD-10-CM | POA: Insufficient documentation

## 2013-04-10 DIAGNOSIS — Z9884 Bariatric surgery status: Secondary | ICD-10-CM | POA: Insufficient documentation

## 2013-04-10 DIAGNOSIS — K5730 Diverticulosis of large intestine without perforation or abscess without bleeding: Secondary | ICD-10-CM | POA: Insufficient documentation

## 2013-04-10 DIAGNOSIS — Z91041 Radiographic dye allergy status: Secondary | ICD-10-CM | POA: Insufficient documentation

## 2013-04-10 DIAGNOSIS — M129 Arthropathy, unspecified: Secondary | ICD-10-CM | POA: Insufficient documentation

## 2013-04-10 HISTORY — PX: COLONOSCOPY: SHX5424

## 2013-04-10 SURGERY — COLONOSCOPY
Anesthesia: Monitor Anesthesia Care

## 2013-04-10 MED ORDER — PROPOFOL 10 MG/ML IV BOLUS
INTRAVENOUS | Status: AC
  Filled 2013-04-10: qty 20

## 2013-04-10 MED ORDER — PROMETHAZINE HCL 25 MG/ML IJ SOLN: 6.2500 mg | INTRAMUSCULAR | Status: DC | PRN

## 2013-04-10 MED ORDER — PROPOFOL INFUSION 10 MG/ML OPTIME
INTRAVENOUS | Status: DC | PRN
  Administered 2013-04-10: 300 ug/kg/min via INTRAVENOUS

## 2013-04-10 MED ORDER — LACTATED RINGERS IV SOLN
INTRAVENOUS | Status: DC
  Administered 2013-04-10: 1000 mL via INTRAVENOUS

## 2013-04-10 MED ORDER — SODIUM CHLORIDE 0.9 % IV SOLN: INTRAVENOUS | Status: DC

## 2013-04-10 NOTE — Anesthesia Postprocedure Evaluation (Signed)
  Anesthesia Post-op Note  Patient: [name]  Procedure(s) Performed: Procedure(s) (LRB): COLONOSCOPY (N/A)  Patient Location: PACU  Anesthesia Type: MAC  Level of Consciousness: awake and alert   Airway and Oxygen Therapy: Patient Spontanous Breathing  Post-op Pain: mild  Post-op Assessment: Post-op Vital signs reviewed, Patient's Cardiovascular Status Stable, Respiratory Function Stable, Patent Airway and No signs of Nausea or vomiting  Last Vitals:  Filed Vitals:   04/10/13 1010  BP: 134/82  Temp:   Resp: 16    Post-op Vital Signs: stable   Complications: No apparent anesthesia complications

## 2013-04-10 NOTE — Anesthesia Preprocedure Evaluation (Signed)
Anesthesia Evaluation  Patient identified by MRN, date of birth, ID band Patient awake    Reviewed: Allergy & Precautions, H&P , NPO status , Patient's Chart, lab work & pertinent test results  Airway Mallampati: II TM Distance: <3 FB Neck ROM: Full    Dental no notable dental hx.    Pulmonary neg pulmonary ROS,  breath sounds clear to auscultation  Pulmonary exam normal       Cardiovascular negative cardio ROS  Rhythm:Regular Rate:Normal     Neuro/Psych negative neurological ROS  negative psych ROS   GI/Hepatic Neg liver ROS, GERD-  Medicated,  Endo/Other  Morbid obesity  Renal/GU negative Renal ROS  negative genitourinary   Musculoskeletal negative musculoskeletal ROS (+)   Abdominal   Peds negative pediatric ROS (+)  Hematology negative hematology ROS (+)   Anesthesia Other Findings   Reproductive/Obstetrics negative OB ROS                           Anesthesia Physical Anesthesia Plan  ASA: II  Anesthesia Plan: MAC   Post-op Pain Management:    Induction: Intravenous  Airway Management Planned: Simple Face Mask  Additional Equipment:   Intra-op Plan:   Post-operative Plan:   Informed Consent: I have reviewed the patients History and Physical, chart, labs and discussed the procedure including the risks, benefits and alternatives for the proposed anesthesia with the patient or authorized representative who has indicated his/her understanding and acceptance.     Plan Discussed with: CRNA and Surgeon  Anesthesia Plan Comments:         Anesthesia Quick Evaluation

## 2013-04-10 NOTE — Discharge Instructions (Signed)
Conscious Sedation, Adult, Care After °Refer to this sheet in the next few weeks. These instructions provide you with information on caring for yourself after your procedure. Your health care provider may also give you more specific instructions. Your treatment has been planned according to current medical practices, but problems sometimes occur. Call your health care provider if you have any problems or questions after your procedure. °WHAT TO EXPECT AFTER THE PROCEDURE  °After your procedure: °· You may feel sleepy, clumsy, and have poor balance for several hours. °· Vomiting may occur if you eat too soon after the procedure. °HOME CARE INSTRUCTIONS °· Do not participate in any activities where you could become injured for at least 24 hours. Do not: °· Drive. °· Swim. °· Ride a bicycle. °· Operate heavy machinery. °· Cook. °· Use power tools. °· Climb ladders. °· Work from a high place. °· Do not make important decisions or sign legal documents until you are improved. °· If you vomit, drink water, juice, or soup when you can drink without vomiting. Make sure you have little or no nausea before eating solid foods. °· Only take over-the-counter or prescription medicines for pain, discomfort, or fever as directed by your health care provider. °· Make sure you and your family fully understand everything about the medicines given to you, including what side effects may occur. °· You should not drink alcohol, take sleeping pills, or take medicines that cause drowsiness for at least 24 hours. °· If you smoke, do not smoke without supervision. °· If you are feeling better, you may resume normal activities 24 hours after you were sedated. °· Keep all appointments with your health care provider. °SEEK MEDICAL CARE IF: °· Your skin is pale or bluish in color. °· You continue to feel nauseous or vomit. °· Your pain is getting worse and is not helped by medicine. °· You have bleeding or swelling. °· You are still sleepy or  feeling clumsy after 24 hours. °SEEK IMMEDIATE MEDICAL CARE IF: °· You develop a rash. °· You have difficulty breathing. °· You develop any type of allergic problem. °· You have a fever. °MAKE SURE YOU: °· Understand these instructions. °· Will watch your condition. °· Will get help right away if you are not doing well or get worse. °Document Released: 11/12/2012 Document Reviewed: 08/29/2012 °[employer]® Patient Information ©2014 [employer]. °Colonoscopy °Care After °These instructions give you information on caring for yourself after your procedure. Your doctor may also give you more specific instructions. Call your doctor if you have any problems or questions after your procedure. °HOME CARE °· Take it easy for the next 24 hours. °· Rest. °· Walk or use warm packs on your belly (abdomen) if you have belly cramping or gas. °· Do not drive for 24 hours. °· You may shower. °· Do not sign important papers or use machinery for 24 hours. °· Drink enough fluids to keep your pee (urine) clear or pale yellow. °· Resume your normal diet. Avoid heavy or fried foods. °· Avoid alcohol. °· Continue taking your normal medicines. °· Only take medicine as told by your doctor. Do not take aspirin. °If you had growths (polyps) removed: °· Do not take aspirin. °· Do not drink alcohol for 7 days or as told by your doctor. °· Eat a soft diet for 24 hours. °GET HELP RIGHT AWAY IF: °· You have a fever. °· You pass clumps of tissue (blood clots) or fill the toilet with blood. °· You   have belly pain that gets worse and medicine does not help. °· Your belly is puffy (swollen). °· You feel sick to your stomach (nauseous) or throw up (vomit). °MAKE SURE YOU: °· Understand these instructions. °· Will watch your condition. °· Will get help right away if you are not doing well or get worse. °Document Released: 02/24/2010 Document Revised: 04/16/2011 Document Reviewed: 09/29/2012 °[employer]® Patient Information ©2014 [employer]. ° °

## 2013-04-10 NOTE — Preoperative (Signed)
Beta Blockers   Reason not to administer Beta Blockers:Not Applicable 

## 2013-04-10 NOTE — H&P (Signed)
  [name] HPI: The patient is here today for a screening colonoscopy.  No complaints of hematochezia, melena, abdominal pain, nausea, vomiting, diarrhea, or constipation.  Overall she is well from the GI standpoint.  Two years ago she underwent an emergent EGD and she was identified to have two lesser curvature gastric ulcers.  One had a visible vessel and it was treated.  She continues with the use of Protonix on a daily basis.  Past Medical History  Diagnosis Date  . Leg swelling 03-24-13    not a problem any longer  . Difficulty in swallowing 03-24-13    no longer a problem  . Arthritis   . Bruises easily   . Reflux   . Shortness of breath   . Blood transfusion   . GERD (gastroesophageal reflux disease)   . Peripheral vascular disease   . Anemia   . Diabetes mellitus     Pt had DM prior to Lap Band. Does not have it now per pt.  . Bleeding ulcer 12-2010  hospitalized for 1 week  . Constipation   . Vomiting 03-24-13    problem resolved after lap band revision  . Blood transfusion without reported diagnosis     2011-2 units transfused, bleeding ulcer  . Hyperlipidemia     Past Surgical History  Procedure Laterality Date  . Laparoscopic gastric banding  01-05-2008  . Endovenous ablation saphenous vein w/ laser  06-2008  . Salphingooophorectomy Right     cyst removal  . Wedge resection of ovary  LEFT OVARY   . Carpel tunnel bilatereal    . Diagnostic laparoscopy    . Appendectomy    . Laparoscopic gastric banding  12/18/2010    01/04/2009  . Esophagogastroduodenoscopy  12/18/2010    Procedure: ESOPHAGOGASTRODUODENOSCOPY (EGD);  Surgeon: Gatha Mayer, MD;  Location: [facility];  Service: Endoscopy;  Laterality: N/A;  . Tonsillectomy    . Cholecystectomy      5'12    Family History  Problem Relation Age of Onset  . Heart disease Father   . Kidney disease Father   . Peripheral vascular disease Father   . Heart attack Father     9  . Diabetes Mother   .  Arthritis Mother     Social History:  reports that she has never smoked. She has never used smokeless tobacco. She reports that she does not drink alcohol or use illicit drugs.  Allergies:  Allergies  Allergen Reactions  . Ancef [Cefazolin Sodium]   . Contrast Media [Iodinated Diagnostic Agents]     apnea  . Penicillins Other (See Comments)    Patient does not recall the reaction, it happened when she was a child    Medications:  Scheduled:  Continuous: . sodium chloride      No results found for this or any previous visit (from the past 24 hour(s)).   No results found.  ROS:  As stated above in the HPI otherwise negative.  Blood pressure 144/89, temperature 98.1 F (36.7 C), temperature source Oral, resp. rate 19, height 5\' 6"  (1.676 m), weight 258 lb (117.028 kg), SpO2 99.00%.    PE: Gen: NAD, Alert and Oriented HEENT:  [place]/AT, EOMI Neck: Supple, no LAD Lungs: CTA Bilaterally CV: RRR without M/G/R ABM: Soft, NTND, +BS Ext: No C/C/E  Assessment/Plan: 1) Screening colonoscopy.  Plan: 1) Colonoscopy.  Burke Terry D 04/10/2013, 8:15 AM

## 2013-04-10 NOTE — Transfer of Care (Signed)
Immediate Anesthesia Transfer of Care Note  Patient: [name]  Procedure(s) Performed: Procedure(s): COLONOSCOPY (N/A)  Patient Location: PACU and Endoscopy Unit  Anesthesia Type:MAC  Level of Consciousness: awake, sedated and patient cooperative  Airway & Oxygen Therapy: Patient Spontanous Breathing and Patient connected to nasal cannula oxygen  Post-op Assessment: Report given to PACU RN and Post -op Vital signs reviewed and stable  Post vital signs: Reviewed and stable  Complications: No apparent anesthesia complications

## 2013-04-10 NOTE — Op Note (Signed)
[facility] [place], [address]   OPERATIVE PROCEDURE REPORT  PATIENT: Sherri, Ford  MR#: [id number] BIRTHDATE: 07/01/1960  GENDER: Female ENDOSCOPIST: Carol Ada, MD ASSISTANT:   Mahala Menghini, technician Verlon Au, RN, BSN PROCEDURE DATE: 04/10/2013 PROCEDURE:   Colonoscopy with snare polypectomy ASA CLASS:   Class III INDICATIONS:Screening MEDICATIONS: MAC, CRNA  DESCRIPTION OF PROCEDURE:   After the risks benefits and alternatives of the procedure were thoroughly explained, informed consent was obtained.  A digital rectal exam revealed no abnormalities of the rectum.    The Pentax Colonoscope [id number] endoscope was introduced through the anus  and advanced to the cecum, which was identified by both the appendix and ileocecal valve , No adverse events experienced.    The quality of the prep was excellent. .  The instrument was then slowly withdrawn as the colon was fully examined.     FINDINGS: A 1 cm sessile cecal polyp was identified and removed with a cold snare.  Four other 3-4 polyps were removed in the ascending, transverse, and sigmoid colon with a cold snare.  Scattered diverticula were noted throughout the entire colon, but the majority were left-sided.   Retroflexed views revealed internal/external hemorrhoids.     The scope was then withdrawn from the patient and the procedure terminated.  COMPLICATIONS: There were no complications.  IMPRESSION: 1) Polyps. 2) Pandiverticula. 3) Int/Ext hemorrhoids.  RECOMMENDATIONS: 1) Await biopsy results. 2) Repeat the colonoscopy in 3 years.  _______________________________ eSignedCarol Ada, MD 04/10/2013 9:44 AM

## 2013-04-13 ENCOUNTER — Encounter (HOSPITAL_COMMUNITY): Payer: Self-pay | Admitting: Gastroenterology

## 2013-04-23 ENCOUNTER — Encounter (INDEPENDENT_AMBULATORY_CARE_PROVIDER_SITE_OTHER): Payer: Commercial Managed Care - PPO

## 2013-06-05 DIAGNOSIS — I2699 Other pulmonary embolism without acute cor pulmonale: Secondary | ICD-10-CM

## 2013-06-05 DIAGNOSIS — I82409 Acute embolism and thrombosis of unspecified deep veins of unspecified lower extremity: Secondary | ICD-10-CM

## 2013-06-05 HISTORY — DX: Other pulmonary embolism without acute cor pulmonale: I26.99

## 2013-06-05 HISTORY — DX: Acute embolism and thrombosis of unspecified deep veins of unspecified lower extremity: I82.409

## 2013-06-11 ENCOUNTER — Inpatient Hospital Stay (HOSPITAL_COMMUNITY)
Admission: EM | Admit: 2013-06-11 | Discharge: 2013-06-15 | DRG: 163 | Disposition: A | Discharge status: Home / Self Care | Payer: 59 | Attending: Internal Medicine | Admitting: Internal Medicine

## 2013-06-11 ENCOUNTER — Inpatient Hospital Stay (HOSPITAL_COMMUNITY): Payer: 59

## 2013-06-11 ENCOUNTER — Emergency Department (HOSPITAL_COMMUNITY): Payer: 59

## 2013-06-11 ENCOUNTER — Encounter (HOSPITAL_COMMUNITY): Payer: Self-pay | Admitting: Emergency Medicine

## 2013-06-11 DIAGNOSIS — R0600 Dyspnea, unspecified: Secondary | ICD-10-CM | POA: Diagnosis present

## 2013-06-11 DIAGNOSIS — I83893 Varicose veins of bilateral lower extremities with other complications: Secondary | ICD-10-CM | POA: Diagnosis present

## 2013-06-11 DIAGNOSIS — E119 Type 2 diabetes mellitus without complications: Secondary | ICD-10-CM | POA: Diagnosis present

## 2013-06-11 DIAGNOSIS — M79606 Pain in leg, unspecified: Secondary | ICD-10-CM

## 2013-06-11 DIAGNOSIS — R0609 Other forms of dyspnea: Secondary | ICD-10-CM

## 2013-06-11 DIAGNOSIS — I739 Peripheral vascular disease, unspecified: Secondary | ICD-10-CM | POA: Diagnosis present

## 2013-06-11 DIAGNOSIS — Z7982 Long term (current) use of aspirin: Secondary | ICD-10-CM

## 2013-06-11 DIAGNOSIS — I2699 Other pulmonary embolism without acute cor pulmonale: Secondary | ICD-10-CM

## 2013-06-11 DIAGNOSIS — I509 Heart failure, unspecified: Secondary | ICD-10-CM | POA: Diagnosis present

## 2013-06-11 DIAGNOSIS — I2692 Saddle embolus of pulmonary artery without acute cor pulmonale: Principal | ICD-10-CM | POA: Diagnosis present

## 2013-06-11 DIAGNOSIS — L0390 Cellulitis, unspecified: Secondary | ICD-10-CM

## 2013-06-11 DIAGNOSIS — J9600 Acute respiratory failure, unspecified whether with hypoxia or hypercapnia: Secondary | ICD-10-CM | POA: Diagnosis present

## 2013-06-11 DIAGNOSIS — I824Z9 Acute embolism and thrombosis of unspecified deep veins of unspecified distal lower extremity: Secondary | ICD-10-CM | POA: Diagnosis present

## 2013-06-11 DIAGNOSIS — E669 Obesity, unspecified: Secondary | ICD-10-CM | POA: Diagnosis present

## 2013-06-11 DIAGNOSIS — IMO0002 Reserved for concepts with insufficient information to code with codable children: Secondary | ICD-10-CM | POA: Diagnosis not present

## 2013-06-11 DIAGNOSIS — K219 Gastro-esophageal reflux disease without esophagitis: Secondary | ICD-10-CM | POA: Diagnosis present

## 2013-06-11 DIAGNOSIS — I50810 Right heart failure, unspecified: Secondary | ICD-10-CM

## 2013-06-11 DIAGNOSIS — Z8249 Family history of ischemic heart disease and other diseases of the circulatory system: Secondary | ICD-10-CM

## 2013-06-11 DIAGNOSIS — L0291 Cutaneous abscess, unspecified: Secondary | ICD-10-CM

## 2013-06-11 DIAGNOSIS — Z6841 Body Mass Index (BMI) 40.0 and over, adult: Secondary | ICD-10-CM

## 2013-06-11 DIAGNOSIS — J9601 Acute respiratory failure with hypoxia: Secondary | ICD-10-CM | POA: Diagnosis present

## 2013-06-11 DIAGNOSIS — M79609 Pain in unspecified limb: Secondary | ICD-10-CM

## 2013-06-11 DIAGNOSIS — Z880 Allergy status to penicillin: Secondary | ICD-10-CM

## 2013-06-11 DIAGNOSIS — Z833 Family history of diabetes mellitus: Secondary | ICD-10-CM

## 2013-06-11 DIAGNOSIS — R0989 Other specified symptoms and signs involving the circulatory and respiratory systems: Secondary | ICD-10-CM

## 2013-06-11 DIAGNOSIS — Z881 Allergy status to other antibiotic agents status: Secondary | ICD-10-CM

## 2013-06-11 DIAGNOSIS — M25559 Pain in unspecified hip: Secondary | ICD-10-CM | POA: Diagnosis present

## 2013-06-11 DIAGNOSIS — K9509 Other complications of gastric band procedure: Secondary | ICD-10-CM

## 2013-06-11 DIAGNOSIS — R079 Chest pain, unspecified: Secondary | ICD-10-CM | POA: Diagnosis present

## 2013-06-11 DIAGNOSIS — Z91041 Radiographic dye allergy status: Secondary | ICD-10-CM

## 2013-06-11 DIAGNOSIS — D62 Acute posthemorrhagic anemia: Secondary | ICD-10-CM

## 2013-06-11 DIAGNOSIS — K922 Gastrointestinal hemorrhage, unspecified: Secondary | ICD-10-CM

## 2013-06-11 DIAGNOSIS — E785 Hyperlipidemia, unspecified: Secondary | ICD-10-CM | POA: Diagnosis present

## 2013-06-11 DIAGNOSIS — Z9884 Bariatric surgery status: Secondary | ICD-10-CM

## 2013-06-11 LAB — PROTIME-INR
INR: 1.15 (ref 0.00–1.49)
PROTHROMBIN TIME: 14.5 s (ref 11.6–15.2)

## 2013-06-11 LAB — I-STAT CHEM 8, ED
BUN: 12 mg/dL (ref 6–23)
CHLORIDE: 99 meq/L (ref 96–112)
Calcium, Ion: 1.12 mmol/L (ref 1.12–1.23)
Creatinine, Ser: 0.7 mg/dL (ref 0.50–1.10)
Glucose, Bld: 107 mg/dL — ABNORMAL HIGH (ref 70–99)
HCT: 46 % (ref 36.0–46.0)
Hemoglobin: 15.6 g/dL — ABNORMAL HIGH (ref 12.0–15.0)
Potassium: 4 mEq/L (ref 3.7–5.3)
Sodium: 141 mEq/L (ref 137–147)
TCO2: 26 mmol/L (ref 0–100)

## 2013-06-11 LAB — CBC
HCT: 44.1 % (ref 36.0–46.0)
HCT: 42.4 % (ref 36.0–46.0)
HEMOGLOBIN: 14.2 g/dL (ref 12.0–15.0)
Hemoglobin: 14.6 g/dL (ref 12.0–15.0)
MCH: 29.4 pg (ref 26.0–34.0)
MCH: 29.5 pg (ref 26.0–34.0)
MCHC: 33.1 g/dL (ref 30.0–36.0)
MCHC: 33.5 g/dL (ref 30.0–36.0)
MCV: 88.7 fL (ref 78.0–100.0)
MCV: 88 fL (ref 78.0–100.0)
PLATELETS: 156 10*3/uL (ref 150–400)
Platelets: 136 10*3/uL — ABNORMAL LOW (ref 150–400)
RBC: 4.97 MIL/uL (ref 3.87–5.11)
RBC: 4.82 MIL/uL (ref 3.87–5.11)
RDW: 13.9 % (ref 11.5–15.5)
RDW: 13.6 % (ref 11.5–15.5)
WBC: 6.2 10*3/uL (ref 4.0–10.5)
WBC: 4.1 10*3/uL (ref 4.0–10.5)

## 2013-06-11 LAB — I-STAT TROPONIN, ED: TROPONIN I, POC: 0.14 ng/mL — AB (ref 0.00–0.08)

## 2013-06-11 LAB — D-DIMER, QUANTITATIVE (NOT AT ARMC): D DIMER QUANT: 13.6 ug{FEU}/mL — AB (ref 0.00–0.48)

## 2013-06-11 LAB — APTT: APTT: 82 s — AB (ref 24–37)

## 2013-06-11 LAB — PRO B NATRIURETIC PEPTIDE: Pro B Natriuretic peptide (BNP): 477.8 pg/mL — ABNORMAL HIGH (ref 0–125)

## 2013-06-11 LAB — FIBRINOGEN: Fibrinogen: 485 mg/dL — ABNORMAL HIGH (ref 204–475)

## 2013-06-11 LAB — TROPONIN I
TROPONIN I: 0.44 ng/mL — AB (ref <0.30)
Troponin I: 0.4 ng/mL (ref <0.30)

## 2013-06-11 LAB — HEPARIN LEVEL (UNFRACTIONATED): HEPARIN UNFRACTIONATED: 0.75 [IU]/mL — AB (ref 0.30–0.70)

## 2013-06-11 LAB — MRSA PCR SCREENING: MRSA by PCR: NEGATIVE

## 2013-06-11 MED ORDER — RANITIDINE HCL 150 MG/10ML PO SYRP
150.0000 mg | ORAL_SOLUTION | ORAL | Status: AC
  Administered 2013-06-11: 150 mg via ORAL
  Filled 2013-06-11: qty 10

## 2013-06-11 MED ORDER — DIPHENHYDRAMINE HCL 50 MG/ML IJ SOLN: 50.0000 mg | Freq: Three times a day (TID) | INTRAMUSCULAR | Status: DC | PRN

## 2013-06-11 MED ORDER — MIDAZOLAM HCL 2 MG/2ML IJ SOLN
INTRAMUSCULAR | Status: AC | PRN
  Administered 2013-06-11: 1 mg via INTRAVENOUS

## 2013-06-11 MED ORDER — ASPIRIN 81 MG PO CHEW: 324.0000 mg | CHEWABLE_TABLET | Freq: Once | ORAL | Status: DC

## 2013-06-11 MED ORDER — ENOXAPARIN SODIUM 120 MG/0.8ML ~~LOC~~ SOLN
120.0000 mg | SUBCUTANEOUS | Status: AC
  Administered 2013-06-11: 120 mg via SUBCUTANEOUS
  Filled 2013-06-11: qty 0.8

## 2013-06-11 MED ORDER — METHYLPREDNISOLONE SODIUM SUCC 125 MG IJ SOLR
125.0000 mg | Freq: Once | INTRAMUSCULAR | Status: AC
  Administered 2013-06-11: 125 mg via INTRAVENOUS
  Filled 2013-06-11: qty 2

## 2013-06-11 MED ORDER — ALTEPLASE 50 MG IV SOLR
12.0000 mg | Freq: Once | INTRAVENOUS | Status: DC
  Filled 2013-06-11: qty 12

## 2013-06-11 MED ORDER — SODIUM CHLORIDE 0.9 % IV SOLN
12.0000 mg | Freq: Once | INTRAVENOUS | Status: AC
  Administered 2013-06-11: 12 mg via INTRAVENOUS
  Filled 2013-06-11: qty 12

## 2013-06-11 MED ORDER — ASPIRIN 81 MG PO CHEW
324.0000 mg | CHEWABLE_TABLET | Freq: Once | ORAL | Status: AC
  Administered 2013-06-11: 324 mg via ORAL
  Filled 2013-06-11: qty 4

## 2013-06-11 MED ORDER — HEPARIN (PORCINE) IN NACL 100-0.45 UNIT/ML-% IJ SOLN
1600.0000 [IU]/h | INTRAMUSCULAR | Status: DC
Start: 2013-06-11 — End: 2013-06-11
  Administered 2013-06-11: 1600 [IU]/h via INTRAVENOUS
  Filled 2013-06-11: qty 250

## 2013-06-11 MED ORDER — ONDANSETRON HCL 4 MG/2ML IJ SOLN: 4.0000 mg | Freq: Four times a day (QID) | INTRAMUSCULAR | Status: DC | PRN

## 2013-06-11 MED ORDER — HYDROCODONE-ACETAMINOPHEN 5-325 MG PO TABS
1.0000 | ORAL_TABLET | ORAL | Status: DC | PRN
  Administered 2013-06-12: 1 via ORAL
  Filled 2013-06-11: qty 1
  Filled 2013-06-11: qty 2

## 2013-06-11 MED ORDER — GUAIFENESIN-DM 100-10 MG/5ML PO SYRP
5.0000 mL | ORAL_SOLUTION | ORAL | Status: DC | PRN
  Filled 2013-06-11: qty 5

## 2013-06-11 MED ORDER — FENTANYL CITRATE 0.05 MG/ML IJ SOLN
INTRAMUSCULAR | Status: AC | PRN
  Administered 2013-06-11: 50 ug via INTRAVENOUS

## 2013-06-11 MED ORDER — MORPHINE SULFATE 4 MG/ML IJ SOLN
4.0000 mg | Freq: Once | INTRAMUSCULAR | Status: DC
  Filled 2013-06-11 (×2): qty 1

## 2013-06-11 MED ORDER — HEPARIN (PORCINE) IN NACL 100-0.45 UNIT/ML-% IJ SOLN
1450.0000 [IU]/h | INTRAMUSCULAR | Status: DC
  Filled 2013-06-11 (×4): qty 250

## 2013-06-11 MED ORDER — ALTEPLASE (PULMONARY EMBOLISM) INFUSION: 100.0000 mg | Freq: Once | INTRAVENOUS | Status: DC

## 2013-06-11 MED ORDER — LORAZEPAM 2 MG/ML IJ SOLN
0.5000 mg | Freq: Once | INTRAMUSCULAR | Status: AC | PRN
  Administered 2013-06-11: 0.5 mg via INTRAVENOUS
  Filled 2013-06-11: qty 1

## 2013-06-11 MED ORDER — SODIUM CHLORIDE 0.9 % IV SOLN: INTRAVENOUS | Status: DC

## 2013-06-11 MED ORDER — SODIUM CHLORIDE 0.9 % IV SOLN
INTRAVENOUS | Status: DC
  Administered 2013-06-12: 250 mL via INTRAVENOUS

## 2013-06-11 MED ORDER — IOHEXOL 300 MG/ML  SOLN
100.0000 mL | Freq: Once | INTRAMUSCULAR | Status: AC | PRN
  Administered 2013-06-11: 20 mL via INTRAVENOUS

## 2013-06-11 MED ORDER — SODIUM CHLORIDE 0.9 % IV SOLN: 250.0000 mL | INTRAVENOUS | Status: DC | PRN

## 2013-06-11 MED ORDER — ONDANSETRON HCL 4 MG PO TABS: 4.0000 mg | ORAL_TABLET | Freq: Four times a day (QID) | ORAL | Status: DC | PRN

## 2013-06-11 MED ORDER — POLYETHYLENE GLYCOL 3350 17 G PO PACK
17.0000 g | PACK | Freq: Every day | ORAL | Status: DC | PRN
  Filled 2013-06-11: qty 1

## 2013-06-11 MED ORDER — FUROSEMIDE 20 MG PO TABS
20.0000 mg | ORAL_TABLET | Freq: Every day | ORAL | Status: DC
  Administered 2013-06-11 – 2013-06-15 (×6): 20 mg via ORAL
  Filled 2013-06-11 (×7): qty 1

## 2013-06-11 MED ORDER — DIPHENHYDRAMINE HCL 50 MG/ML IJ SOLN
25.0000 mg | Freq: Once | INTRAMUSCULAR | Status: AC
  Administered 2013-06-11: 25 mg via INTRAVENOUS
  Filled 2013-06-11: qty 1

## 2013-06-11 MED ORDER — DIPHENHYDRAMINE HCL 50 MG/ML IJ SOLN
25.0000 mg | INTRAMUSCULAR | Status: AC
Start: 2013-06-11 — End: 2013-06-11
  Administered 2013-06-11: 25 mg via INTRAVENOUS
  Filled 2013-06-11: qty 1

## 2013-06-11 MED ORDER — SODIUM CHLORIDE 0.9 % IJ SOLN
3.0000 mL | Freq: Two times a day (BID) | INTRAMUSCULAR | Status: DC
  Administered 2013-06-11 – 2013-06-14 (×6): 3 mL via INTRAVENOUS

## 2013-06-11 MED ORDER — MIDAZOLAM HCL 2 MG/2ML IJ SOLN
INTRAMUSCULAR | Status: AC
  Filled 2013-06-11: qty 4

## 2013-06-11 MED ORDER — SODIUM CHLORIDE 0.9 % IJ SOLN
3.0000 mL | Freq: Two times a day (BID) | INTRAMUSCULAR | Status: DC
  Administered 2013-06-11 – 2013-06-15 (×8): 3 mL via INTRAVENOUS

## 2013-06-11 MED ORDER — METHYLPREDNISOLONE SODIUM SUCC 125 MG IJ SOLR
60.0000 mg | INTRAMUSCULAR | Status: AC
  Administered 2013-06-11: 60 mg via INTRAVENOUS
  Filled 2013-06-11: qty 2

## 2013-06-11 MED ORDER — FENTANYL CITRATE 0.05 MG/ML IJ SOLN
INTRAMUSCULAR | Status: AC
  Filled 2013-06-11: qty 4

## 2013-06-11 MED ORDER — IOHEXOL 350 MG/ML SOLN
80.0000 mL | Freq: Once | INTRAVENOUS | Status: AC | PRN
  Administered 2013-06-11: 80 mL via INTRAVENOUS

## 2013-06-11 MED ORDER — ONDANSETRON HCL 4 MG/2ML IJ SOLN
4.0000 mg | Freq: Once | INTRAMUSCULAR | Status: DC
Start: 2013-06-11 — End: 2013-06-12
  Filled 2013-06-11 (×2): qty 2

## 2013-06-11 MED ORDER — LINACLOTIDE 145 MCG PO CAPS
145.0000 ug | ORAL_CAPSULE | Freq: Every day | ORAL | Status: DC | PRN
  Filled 2013-06-11: qty 1

## 2013-06-11 MED ORDER — FAMOTIDINE IN NACL 20-0.9 MG/50ML-% IV SOLN
20.0000 mg | INTRAVENOUS | Status: AC
  Administered 2013-06-11: 20 mg via INTRAVENOUS
  Filled 2013-06-11: qty 50

## 2013-06-11 MED ORDER — SODIUM CHLORIDE 0.9 % IJ SOLN: 3.0000 mL | INTRAMUSCULAR | Status: DC | PRN

## 2013-06-11 MED ORDER — PANTOPRAZOLE SODIUM 40 MG PO TBEC
40.0000 mg | DELAYED_RELEASE_TABLET | Freq: Every day | ORAL | Status: DC
Start: 2013-06-11 — End: 2013-06-12
  Administered 2013-06-11: 40 mg via ORAL
  Filled 2013-06-11: qty 1

## 2013-06-11 NOTE — Progress Notes (Signed)
  Echocardiogram 2D Echocardiogram has been performed.  Valinda Hoar 06/11/2013, 1:12 PM

## 2013-06-11 NOTE — Consult Note (Signed)
Name: [name] MRN: [id number] DOB: 1960/10/31    ADMISSION DATE:  06/11/2013 CONSULTATION DATE:  06/11/2013  REFERRING MD :  EDP PRIMARY SERVICE:  TRH  CHIEF COMPLAINT:  Chest pain  BRIEF PATIENT DESCRIPTION: 53 yo female with PHM of gastric bypass, GIB, and GERD presented to [facility] ED 5/7 c/o several weeks SOB. 5/6 she began having chest pain and had a syncopal episode. In ED CTA showed acute submassive PE with evidence of R heart strain. PCCM asked to consult.  SIGNIFICANT EVENTS / STUDIES:  5/7 CTA chest: acute PE with CT evidence of right heart strain consistent with at least submassive PE. Scattered small bilateral peripheral pulmonary infarcts.   LINES / TUBES: PIV  CULTURES: None  ANTIBIOTICS: None  HISTORY OF PRESENT ILLNESS: 53 year old female with PMH as below. She has been SOB for several weeks. She saw PCP for this and was told she had a virus. SOB persisted so she returned to PCP 5/4. Pt was then told to go to [facility] and have an Echo [facility] [facility] on Wed. 5/6 was waiting for results of the test when this evening she bent down picked up her dog and walked down the hall, she felt SOB and had syncopal episode. She presented to ED 5/7 where CTA showed submassive PE with evidence of R heart strain. She denies recent long car/plane trips, HRT, smoking, and recent unexplained weight loss. PCCM asked to consult.    PAST MEDICAL HISTORY :  Past Medical History  Diagnosis Date  . Leg swelling 03-24-13    not a problem any longer  . Difficulty in swallowing 03-24-13    no longer a problem  . Arthritis   . Bruises easily   . Reflux   . Shortness of breath   . Blood transfusion   . GERD (gastroesophageal reflux disease)   . Peripheral vascular disease   . Anemia   . Diabetes mellitus     Pt had DM prior to Lap Band. Does not have it now per pt.  . Bleeding ulcer 12-2010  hospitalized for 1 week  . Constipation   . Vomiting 03-24-13    problem resolved after lap band  revision  . Blood transfusion without reported diagnosis     2011-2 units transfused, bleeding ulcer  . Hyperlipidemia    Past Surgical History  Procedure Laterality Date  . Laparoscopic gastric banding  01-05-2008  . Endovenous ablation saphenous vein w/ laser  06-2008  . Salphingooophorectomy Right     cyst removal  . Wedge resection of ovary  LEFT OVARY   . Carpel tunnel bilatereal    . Diagnostic laparoscopy    . Appendectomy    . Laparoscopic gastric banding  12/18/2010    01/04/2009  . Esophagogastroduodenoscopy  12/18/2010    Procedure: ESOPHAGOGASTRODUODENOSCOPY (EGD);  Surgeon: Gatha Mayer, MD;  Location: [facility];  Service: Endoscopy;  Laterality: N/A;  . Tonsillectomy    . Cholecystectomy      5'12  . Colonoscopy N/A 04/10/2013    Procedure: COLONOSCOPY;  Surgeon: Beryle Beams, MD;  Location: [facility];  Service: Endoscopy;  Laterality: N/A;   Prior to Admission medications   Medication Sig Start Date End Date Taking? Authorizing Provider  aspirin 81 MG chewable tablet Chew 324 mg by mouth once.   Yes Historical Provider, MD  Calcium Carb-Cholecalciferol (CALCIUM 1000 + D PO) Take 1,000 mg by mouth daily.     Yes Historical Provider, MD  Cefixime  200 MG CHEW Chew 400 mg by mouth daily. For 10 days 06/08/13 06/18/13 Yes Historical Provider, MD  furosemide (LASIX) 20 MG tablet Take 20 mg by mouth daily.   Yes Historical Provider, MD  Linaclotide (LINZESS) 145 MCG CAPS capsule Take 145 mcg by mouth daily as needed (for IBS pain).   Yes Historical Provider, MD  Multiple Vitamins-Minerals (MULTIVITAMINS THER. W/MINERALS) TABS Take 1 tablet by mouth daily.     Yes Historical Provider, MD  omeprazole (PRILOSEC) 20 MG capsule Take 20 mg by mouth 2 (two) times daily before a meal.   Yes Historical Provider, MD   Allergies  Allergen Reactions  . Ancef [Cefazolin Sodium] Nausea And Vomiting  . Contrast Media [Iodinated Diagnostic Agents]     apnea  . Penicillins Other  (See Comments)    Patient does not recall the reaction, it happened when she was a child    FAMILY HISTORY:  Family History  Problem Relation Age of Onset  . Heart disease Father   . Kidney disease Father   . Peripheral vascular disease Father   . Heart attack Father     79  . Diabetes Mother   . Arthritis Mother    SOCIAL HISTORY:  reports that she has never smoked. She has never used smokeless tobacco. She reports that she does not drink alcohol or use illicit drugs.  REVIEW OF SYSTEMS:   Bolds are positive  Constitutional: weight loss, gain, night sweats, Fevers, chills, fatigue .  HEENT: headaches, Sore throat, sneezing, nasal congestion, post nasal drip, Difficulty swallowing, Tooth/dental problems, visual complaints visual changes, ear ache CV:  chest pain, radiates: ,Orthopnea, PND, swelling in lower extremities, dizziness, palpitations, syncope.  GI  heartburn, indigestion, abdominal pain, nausea, vomiting, diarrhea, change in bowel habits, loss of appetite, bloody stools.  Resp: cough, productive: , hemoptysis, dyspnea, chest pain, pleuritic.  Skin: rash or itching or icterus GU: dysuria, change in color of urine, urgency or frequency. flank pain, hematuria  MS: joint pain or swelling. decreased range of motion  Psych: change in mood or affect. depression or anxiety.  Neuro: difficulty with speech, weakness, numbness, ataxia    SUBJECTIVE:   VITAL SIGNS: Temp:  [98 F (36.7 C)-98.1 F (36.7 C)] 98.1 F (36.7 C) (05/07 1121) Pulse Rate:  [98-110] 100 (05/07 0900) Resp:  [15-33] 33 (05/07 1121) BP: (110-133)/(59-82) 127/78 mmHg (05/07 1121) SpO2:  [91 %-96 %] 93 % (05/07 1121) Weight:  [125.193 kg (276 lb)] 125.193 kg (276 lb) (05/07 0523)  PHYSICAL EXAMINATION: General: Obese female, mild distress at times Neuro:  Alert, oriented, nonfocal HEENT:  [place]/AT, PERRL, no JVD noted Cardiovascular:  RRR Lungs:  Clear anterior Abdomen:  Obese Musculoskeletal: No  acute deformity or ROM limitation Skin:  Intact   Recent Labs Lab 06/11/13 0530  NA 141  K 4.0  CL 99  BUN 12  CREATININE 0.70  GLUCOSE 107*    Recent Labs Lab 06/11/13 0525 06/11/13 0530  HGB 14.6 15.6*  HCT 44.1 46.0  WBC 6.2  --   PLT 156  --    Ct Angio Chest Pe W/cm &/or Wo Cm  06/11/2013   CLINICAL DATA:  53 year old female with chest pain and shortness of Breath. Initial encounter.  EXAM: CT ANGIOGRAPHY CHEST WITH CONTRAST  TECHNIQUE: Multidetector CT imaging of the chest was performed using the standard protocol during bolus administration of intravenous contrast. Multiplanar CT image reconstructions and MIPs were obtained to evaluate the vascular anatomy.  CONTRAST:  57mL OMNIPAQUE IOHEXOL 350 MG/ML SOLN  COMPARISON:  Chest CTA 11/09/2009.  FINDINGS: Good contrast bolus timing in the pulmonary arterial tree. Saddle embolus with bilateral central pulmonary artery filling defects. Bilateral upper and lower lobe clot propagating into the segmental and subsegmental arteries.  Right ventricle to left ventricle ratio calculated at 1.15.  Major airways are patent. Scattered bilateral peripheral sometimes wedge-shaped ground-glass and more confluent pulmonary opacity. Superimposed dependent atelectasis.  No pericardial effusion. Up to only trace bilateral pleural effusions.  No acute osseous abnormality identified.  Negative visualized upper abdominal viscera except for sequelae of gastric banding.  Negative visualized aorta. No mediastinal lymphadenopathy. No axillary lymphadenopathy.  Review of the MIP images confirms the above findings.  IMPRESSION: 1. Positive for acute PE with CTevidence of right heart strain (RV/LV Ratio = 1.2) consistent with at least submassive (intermediate risk) PE. The presence of right heart strain has been associated with an increased risk of morbidity and mortality. 2. Scattered small bilateral peripheral pulmonary infarcts. Superimposed dependent atelectasis.  Only trace pleural fluid.  Critical Value/emergent results were called by telephone at the time of interpretation on 06/11/2013 at 52 AM to Dr. Regenia Skeeter in the ER who verbally acknowledged these results.   Electronically Signed   By: Lars Pinks M.D.   On: 06/11/2013 11:12   Dg Chest Portable 1 View  06/11/2013   CLINICAL DATA:  Chest pain and shortness of breath  EXAM: PORTABLE CHEST - 1 VIEW  COMPARISON:  11/09/2009  FINDINGS: Borderline cardiomegaly, accentuated by technique. Unchanged upper mediastinal contours. Prominent markings at the bases without overt edema. No consolidation, effusion, or pneumothorax.  IMPRESSION: No active disease.   Electronically Signed   By: Jorje Guild M.D.   On: 06/11/2013 06:08    ASSESSMENT / PLAN:  acute hypoxic respiratory failure in setting of PE Submassive PE with evidence of Right heart strain she is hemodynamically stable thus far Troponin elevation in setting of submassive PE Hx GERD/PUD with history of significant arterial peptic ulcer bleed in 2012 requiring transfusion S/p Lap-Band procedure  Plan:  D/c Lovenox immediatly Start IV heparin per pharmacy Stat Echo to assess Right heart strain - i have reviewed this at bedside, moderate hypokinetic dilated, syncope, pos trop, and SOB now = consider local lytic with IR, if unble I would give systemic TPA to lower her risk of pulmonary cripple long term poor functional outcome / RV dysfxn I discussed at length risks / benefit ratio with her and she agrees Consider IR consult for catheter directed lysis as first choice in hopes lower dose exposure Her GI bleed was over 2 years ago without any issues since, will use aggressive ppi BLE doppler pos, if mobile will filter, no role filter otherwise Supplemental O2 to maintain SpO2 greater than 92% Cardiac monitoring Trend troponin Without lytic her risk is high to cross over and require it NO LOVENOX, NO COUMADIN today To 2100 post for recovery ICU, neuro  assessment feaquent If after Lytic and change in MS, stat head CT / cryo if pos   Georgann Housekeeper, ACNP [facility] Pager [phone] or [phone]  I have fully examined this patient and agree with above findings.      Lavon Paganini. Titus Mould, MD, [place] Pgr: [facility]

## 2013-06-11 NOTE — ED Notes (Signed)
CT aware that pt is ready for transport

## 2013-06-11 NOTE — Progress Notes (Signed)
VASCULAR LAB PRELIMINARY  PRELIMINARY  PRELIMINARY  PRELIMINARY  Bilateral lower extremity venous Dopplers completed.    Preliminary report:  There is acute, non occlusive DVT noted in the right posterior tibial vein of the right lower extremity. All other veins appear thrombus free.  Sluggish flow noted throughout bilateral lower extremities.   Iantha Fallen, RVT 06/11/2013, 12:36 PM

## 2013-06-11 NOTE — Progress Notes (Signed)
ANTICOAGULATION CONSULT NOTE - Initial Consult  Pharmacy Consult for Heparin Indication: pulmonary embolus and DVT  Allergies  Allergen Reactions  . Ancef [Cefazolin Sodium] Nausea And Vomiting  . Contrast Media [Iodinated Diagnostic Agents]     apnea  . Penicillins Other (See Comments)    Patient does not recall the reaction, it happened when she was a child   Patient Measurements: Height: 5\' 6"  (167.6 cm) Weight: 276 lb (125.193 kg) IBW/kg (Calculated) : 59.3 Heparin Dosing Weight: 80 kg  Vital Signs: Temp: 98.1 F (36.7 C) (05/07 1121) Temp src: Oral (05/07 1121) BP: 127/78 mmHg (05/07 1121) Pulse Rate: 100 (05/07 0900)  Labs:  Recent Labs  06/11/13 0525 06/11/13 0530 06/11/13 0543  HGB 14.6 15.6*  --   HCT 44.1 46.0  --   PLT 156  --   --   CREATININE  --  0.70  --   TROPONINI  --   --  0.44*   Estimated Creatinine Clearance: 108.8 ml/min (by C-G formula based on Cr of 0.7).  Medical History: Past Medical History  Diagnosis Date  . Leg swelling 03-24-13    not a problem any longer  . Difficulty in swallowing 03-24-13    no longer a problem  . Arthritis   . Bruises easily   . Reflux   . Shortness of breath   . Blood transfusion   . GERD (gastroesophageal reflux disease)   . Peripheral vascular disease   . Anemia   . Diabetes mellitus     Pt had DM prior to Lap Band. Does not have it now per pt.  . Bleeding ulcer 12-2010  hospitalized for 1 week  . Constipation   . Vomiting 03-24-13    problem resolved after lap band revision  . Blood transfusion without reported diagnosis     2011-2 units transfused, bleeding ulcer  . Hyperlipidemia    Assessment: 53 yo female with diagnosis of PE and DVT.  She received one dose of Lovenox 120mg  at ~ 07:00AM today.  We have been asked to transition her to IV heparin while her work up continues.  Her baseline labs reveal a normal H/H.  She has an elevated D-Dimer of 13.6.  She has coag labs ordered but these have not  yet been drawn.  She is morbidly obese and we will use a weight adjusted dosing regimen for her.    Goal of Therapy:  Heparin level 0.3-0.7 units/ml Monitor platelets by anticoagulation protocol: Yes   Plan:  1.  Will begin IV heparin at 1600 units/hr without a bolus since she received a dose of enoxaparin this morning at treatment dose. 2.  Will obtain a heparin level in 8 hours to determine response. 3.  Daily heparin level/CBC 4.  Monitor for bleeding complications 5.  Will need to determine oral anticoagulation plans  Rober Minion, PharmD., MS Clinical Pharmacist Pager:  [phone] Thank you for allowing pharmacy to be part of this patients care team. 06/11/2013,1:26 PM

## 2013-06-11 NOTE — H&P (Signed)
Consult: Request for image guided pulmonary emboli catheter directed lysis. Consult MD: Dr. Tyson Alias.  Chief Complaint: "Shortness of breath, syncope episode and chest pain." HPI: This is a 53 year old female with no previous history of DVT or PE. She states for the past 2 weeks she has been feeling fatigue and thinks she has been experiencing some "viral illness." She has been so fatigue that she has not been working the past several weeks. She complains of shortness of breath x 2 weeks worsening last evening and experiencing mid sternal chest pain. She rates her chest pain earlier today a 4/10 and currently pain free. She also complains of a syncopal episode last evening which brought her to the ED. She had a CTA pulmonary performed which revealed bilateral pulmonary emboli with right heart strain present. A venous duplex was performed which revealed an acute, non occlusive DVT noted in the right posterior tibial vein, all other veins thrombus free. She does state she has had decreased mobility the past 2 weeks and complains of LLE swelling more than RLE, she denies any LE pain or skin changes. She does admit to a history of a bleeding ulcer in 2012 which she did require blood transfusion for and is currently on PPI with no reoccurrence or surgical intervention required. She denies any active bleeding or recent surgery. She does have a contrast media allergy which caused throat swelling and has been pre-medicated for the CTA today with no complications.    Past Medical History  Diagnosis Date  . Leg swelling 03-24-13    not a problem any longer  . Difficulty in swallowing 03-24-13    no longer a problem  . Arthritis   . Bruises easily   . Reflux   . Shortness of breath   . Blood transfusion   . GERD (gastroesophageal reflux disease)   . Peripheral vascular disease   . Anemia   . Diabetes mellitus     Pt had DM prior to Lap Band. Does not have it now per pt.  . Bleeding ulcer 12-2010   hospitalized for 1 week  . Constipation   . Vomiting 03-24-13    problem resolved after lap band revision  . Blood transfusion without reported diagnosis     2011-2 units transfused, bleeding ulcer  . Hyperlipidemia     Past Surgical History  Procedure Laterality Date  . Laparoscopic gastric banding  01-05-2008  . Endovenous ablation saphenous vein w/ laser  06-2008  . Salphingooophorectomy Right     cyst removal  . Wedge resection of ovary  LEFT OVARY   . Carpel tunnel bilatereal    . Diagnostic laparoscopy    . Appendectomy    . Laparoscopic gastric banding  12/18/2010    01/04/2009  . Esophagogastroduodenoscopy  12/18/2010    Procedure: ESOPHAGOGASTRODUODENOSCOPY (EGD);  Surgeon: Iva Boop, MD;  Location: [facility];  Service: Endoscopy;  Laterality: N/A;  . Tonsillectomy    . Cholecystectomy      5'12  . Colonoscopy N/A 04/10/2013    Procedure: COLONOSCOPY;  Surgeon: Theda Belfast, MD;  Location: [facility];  Service: Endoscopy;  Laterality: N/A;    Family History  Problem Relation Age of Onset  . Heart disease Father   . Kidney disease Father   . Peripheral vascular disease Father   . Heart attack Father     73  . Diabetes Mother   . Arthritis Mother    Social History:  reports that she has  never smoked. She has never used smokeless tobacco. She reports that she does not drink alcohol or use illicit drugs.  Allergies:  Allergies  Allergen Reactions  . Ancef [Cefazolin Sodium] Nausea And Vomiting  . Contrast Media [Iodinated Diagnostic Agents]     apnea  . Penicillins Other (See Comments)    Patient does not recall the reaction, it happened when she was a child    Medications Prior to Admission  Medication Sig Dispense Refill  . aspirin 81 MG chewable tablet Chew 324 mg by mouth once.      . Calcium Carb-Cholecalciferol (CALCIUM 1000 + D PO) Take 1,000 mg by mouth daily.        . Cefixime 200 MG CHEW Chew 400 mg by mouth daily. For 10 days      .  furosemide (LASIX) 20 MG tablet Take 20 mg by mouth daily.      . Linaclotide (LINZESS) 145 MCG CAPS capsule Take 145 mcg by mouth daily as needed (for IBS pain).      . Multiple Vitamins-Minerals (MULTIVITAMINS THER. W/MINERALS) TABS Take 1 tablet by mouth daily.        Marland Kitchen omeprazole (PRILOSEC) 20 MG capsule Take 20 mg by mouth 2 (two) times daily before a meal.        Results for orders placed during the hospital encounter of 06/11/13 (from the past 48 hour(s))  CBC     Status: None   Collection Time    06/11/13  5:25 AM      Result Value Ref Range   WBC 6.2  4.0 - 10.5 K/uL   RBC 4.97  3.87 - 5.11 MIL/uL   Hemoglobin 14.6  12.0 - 15.0 g/dL   HCT 44.1  36.0 - 46.0 %   MCV 88.7  78.0 - 100.0 fL   MCH 29.4  26.0 - 34.0 pg   MCHC 33.1  30.0 - 36.0 g/dL   RDW 13.9  11.5 - 15.5 %   Platelets 156  150 - 400 K/uL  I-STAT TROPOININ, ED     Status: Abnormal   Collection Time    06/11/13  5:29 AM      Result Value Ref Range   Troponin i, poc 0.14 (*) 0.00 - 0.08 ng/mL   Comment NOTIFIED PHYSICIAN     Comment 3            Comment: Due to the release kinetics of cTnI,     a negative result within the first hours     of the onset of symptoms does not rule out     myocardial infarction with certainty.     If myocardial infarction is still suspected,     repeat the test at appropriate intervals.  I-STAT CHEM 8, ED     Status: Abnormal   Collection Time    06/11/13  5:30 AM      Result Value Ref Range   Sodium 141  137 - 147 mEq/L   Potassium 4.0  3.7 - 5.3 mEq/L   Chloride 99  96 - 112 mEq/L   BUN 12  6 - 23 mg/dL   Creatinine, Ser 0.70  0.50 - 1.10 mg/dL   Glucose, Bld 107 (*) 70 - 99 mg/dL   Calcium, Ion 1.12  1.12 - 1.23 mmol/L   TCO2 26  0 - 100 mmol/L   Hemoglobin 15.6 (*) 12.0 - 15.0 g/dL   HCT 46.0  36.0 - 46.0 %  TROPONIN I  Status: Abnormal   Collection Time    06/11/13  5:43 AM      Result Value Ref Range   Troponin I 0.44 (*) <0.30 ng/mL   Comment:            Due  to the release kinetics of cTnI,     a negative result within the first hours     of the onset of symptoms does not rule out     myocardial infarction with certainty.     If myocardial infarction is still suspected,     repeat the test at appropriate intervals.     CRITICAL RESULT CALLED TO, READ BACK BY AND VERIFIED WITH:     K.ROBINSON RN [phone] 06/11/13 E.GADDY  D-DIMER, QUANTITATIVE     Status: Abnormal   Collection Time    06/11/13  5:43 AM      Result Value Ref Range   D-Dimer, Quant 13.60 (*) 0.00 - 0.48 ug/mL-FEU   Comment:            AT THE INHOUSE ESTABLISHED CUTOFF     VALUE OF 0.48 ug/mL FEU,     THIS ASSAY HAS BEEN DOCUMENTED     IN THE LITERATURE TO HAVE     A SENSITIVITY AND NEGATIVE     PREDICTIVE VALUE OF AT LEAST     98 TO 99%.  THE TEST RESULT     SHOULD BE CORRELATED WITH     AN ASSESSMENT OF THE CLINICAL     PROBABILITY OF DVT / VTE.  PRO B NATRIURETIC PEPTIDE     Status: Abnormal   Collection Time    06/11/13  6:25 AM      Result Value Ref Range   Pro B Natriuretic peptide (BNP) 477.8 (*) 0 - 125 pg/mL   Ct Angio Chest Pe W/cm &/or Wo Cm  06/11/2013   CLINICAL DATA:  53 year old female with chest pain and shortness of Breath. Initial encounter.  EXAM: CT ANGIOGRAPHY CHEST WITH CONTRAST  TECHNIQUE: Multidetector CT imaging of the chest was performed using the standard protocol during bolus administration of intravenous contrast. Multiplanar CT image reconstructions and MIPs were obtained to evaluate the vascular anatomy.  CONTRAST:  73mL OMNIPAQUE IOHEXOL 350 MG/ML SOLN  COMPARISON:  Chest CTA 11/09/2009.  FINDINGS: Good contrast bolus timing in the pulmonary arterial tree. Saddle embolus with bilateral central pulmonary artery filling defects. Bilateral upper and lower lobe clot propagating into the segmental and subsegmental arteries.  Right ventricle to left ventricle ratio calculated at 1.15.  Major airways are patent. Scattered bilateral peripheral sometimes  wedge-shaped ground-glass and more confluent pulmonary opacity. Superimposed dependent atelectasis.  No pericardial effusion. Up to only trace bilateral pleural effusions.  No acute osseous abnormality identified.  Negative visualized upper abdominal viscera except for sequelae of gastric banding.  Negative visualized aorta. No mediastinal lymphadenopathy. No axillary lymphadenopathy.  Review of the MIP images confirms the above findings.  IMPRESSION: 1. Positive for acute PE with CTevidence of right heart strain (RV/LV Ratio = 1.2) consistent with at least submassive (intermediate risk) PE. The presence of right heart strain has been associated with an increased risk of morbidity and mortality. 2. Scattered small bilateral peripheral pulmonary infarcts. Superimposed dependent atelectasis. Only trace pleural fluid.  Critical Value/emergent results were called by telephone at the time of interpretation on 06/11/2013 at 17 AM to Dr. Regenia Skeeter in the ER who verbally acknowledged these results.   Electronically Signed   By: Lars Pinks  M.D.   On: 06/11/2013 11:12   Dg Chest Portable 1 View  06/11/2013   CLINICAL DATA:  Chest pain and shortness of breath  EXAM: PORTABLE CHEST - 1 VIEW  COMPARISON:  11/09/2009  FINDINGS: Borderline cardiomegaly, accentuated by technique. Unchanged upper mediastinal contours. Prominent markings at the bases without overt edema. No consolidation, effusion, or pneumothorax.  IMPRESSION: No active disease.   Electronically Signed   By: Jorje Guild M.D.   On: 06/11/2013 06:08    Review of Systems  Constitutional: Positive for malaise/fatigue and diaphoresis. Negative for fever and chills.  Respiratory: Positive for shortness of breath.   Cardiovascular: Positive for chest pain and leg swelling.  Gastrointestinal: Negative for blood in stool and melena.  Genitourinary: Negative for hematuria.  Neurological: Positive for loss of consciousness and weakness.    Blood pressure  127/78, pulse 100, temperature 98.1 F (36.7 C), temperature source Oral, resp. rate 33, height 5\' 6"  (1.676 m), weight 276 lb (125.193 kg), SpO2 93.00%. Physical Exam  Vitals reviewed. Constitutional: She is oriented to person, place, and time. She appears well-developed and well-nourished. She appears distressed.  HENT:  Head: Normocephalic and atraumatic.  Eyes: EOM are normal.  Cardiovascular: Regular rhythm, normal heart sounds and intact distal pulses.  Tachycardia present.  Exam reveals no gallop and no friction rub.   No murmur heard. Respiratory: Breath sounds normal. She has no wheezes.  GI: Soft. Bowel sounds are normal. She exhibits no distension. There is no tenderness.  Musculoskeletal: Normal range of motion. She exhibits edema.  1+ edema bilaterally   Neurological: She is alert and oriented to person, place, and time.  Skin: Skin is warm and dry. No rash noted. She is not diaphoretic. No erythema. No pallor.  Psychiatric: She has a normal mood and affect. Her behavior is normal.     Assessment/Plan Shortness of breath Syncopal episode. Bilateral pulmonary emboli with right heart strain, CTA and echo 06/11/13 RLE DVT Chest pain, elevated troponin in setting of bilateral PE Request for image guided PE catheter directed lysis Patient has been NPO, labs reviewed and drawn. Iodinated contrast allergy, pre-medication ordered Risks and Benefits discussed with the patient. All of the patient's questions were answered, patient is agreeable to proceed. Consent signed and in chart.   Hedy Jacob 06/11/2013, 1:54 PM

## 2013-06-11 NOTE — ED Notes (Signed)
EDP Opitz aware of troponin results.

## 2013-06-11 NOTE — Sedation Documentation (Signed)
Attempt to transfer to 74M awaiting room to be cleaned

## 2013-06-11 NOTE — ED Notes (Signed)
MD at bedside. 

## 2013-06-11 NOTE — Procedures (Signed)
Procedure:  Bilateral pulmonary arteriography, transcatheter thrombolytic therapy of bilateral pulmonary arteries Access:  Right common femoral vein Findings:  Right common femoral vein 6 Fr sheaths x 2.  Accidental common femoral artery puncture during access.  4 Fr sheath placed in artery to decrease chance of bleeding from arterial puncture during thrombolytic therapy. Significant clot burden in both PA's.  Main pulmonary artery pressure is 50/25 mm Hg with mean of 36 mm Hg. 6 Fr EKOS catheter with 18 cm infusion length advanced into right PA and extends into lower lobe PA.  Second EKOS catheter with 12 cm infusion length advanced into left PA and lower lobe. Will begin [place] assisted tPA infusions via both catheters at dose of 1 mg/hr over 12 hours for total dose of 24 mg then switch to saline infusions prior to checking PA pressures tomorrow.   Repeat CTA in 48 hours after tPA infusion therapy done. Have initiated IV heparin per Pharmacy.

## 2013-06-11 NOTE — ED Notes (Signed)
Critical care MD at bedside 

## 2013-06-11 NOTE — ED Notes (Signed)
Pt states she has been SOB for several weeks. Pt states she went to the MD and was told she had a virus. Pt continued to feel SOB and went to see her PCP on Monday. Pt was then told to go to [place] and have an Echo Cardiogram on Wed. Pt was waiting for results for the test when this evening she bent down picked up her dog and walked down the hall, she felt like she could not get enough air and had tunnel vision. Pt states that she then passed out. Pt husband states she passed out for approx. 10 sec.

## 2013-06-11 NOTE — ED Provider Notes (Signed)
I was alerted by radiology of the patient's critical CT scan results showing submassive PE with right heart strain. Patient remained hemodynamic stable and her airway is completely intact. She has normal blood pressure. She was given Lovenox prior to the CT scan. I updated her admitting physician, Dr. seeing, and he requests pulmonary/critical care consult. Dr. Titus Mould was consult and will evaluate the patient. I feel she is still stable for step down and have updated her on her CT results.  Ephraim Hamburger, MD 06/11/13 [phone]

## 2013-06-11 NOTE — H&P (Signed)
Patient Demographics  [name], is a 53 y.o. female  MRN: [id number]   DOB - 16-Oct-1960  Admit Date - 06/11/2013  Outpatient Primary MD for the patient is Carlus Pavlov, MD   With History of -  Past Medical History  Diagnosis Date  . Leg swelling 03-24-13    not a problem any longer  . Difficulty in swallowing 03-24-13    no longer a problem  . Arthritis   . Bruises easily   . Reflux   . Shortness of breath   . Blood transfusion   . GERD (gastroesophageal reflux disease)   . Peripheral vascular disease   . Anemia   . Diabetes mellitus     Pt had DM prior to Lap Band. Does not have it now per pt.  . Bleeding ulcer 12-2010  hospitalized for 1 week  . Constipation   . Vomiting 03-24-13    problem resolved after lap band revision  . Blood transfusion without reported diagnosis     2011-2 units transfused, bleeding ulcer  . Hyperlipidemia       Past Surgical History  Procedure Laterality Date  . Laparoscopic gastric banding  01-05-2008  . Endovenous ablation saphenous vein w/ laser  06-2008  . Salphingooophorectomy Right     cyst removal  . Wedge resection of ovary  LEFT OVARY   . Carpel tunnel bilatereal    . Diagnostic laparoscopy    . Appendectomy    . Laparoscopic gastric banding  12/18/2010    01/04/2009  . Esophagogastroduodenoscopy  12/18/2010    Procedure: ESOPHAGOGASTRODUODENOSCOPY (EGD);  Surgeon: Gatha Mayer, MD;  Location: [facility];  Service: Endoscopy;  Laterality: N/A;  . Tonsillectomy    . Cholecystectomy      5'12  . Colonoscopy N/A 04/10/2013    Procedure: COLONOSCOPY;  Surgeon: Beryle Beams, MD;  Location: [facility];  Service: Endoscopy;  Laterality: N/A;    in for   Chief Complaint  Patient presents with  . Chest Pain     HPI  [name]  is a 53 y.o.  female,  with history of chronic leg swelling due to varicose veins wears TED stockings on chronic basis, GERD, status post gastric bypass surgery, type 2 diabetes mellitus now in control after gastric bypass surgery, anemia of chronic disease who works as a Marine scientist in the PACU comes in with 2 week history of gradually progressive shortness of breath, some chest pressure, symptoms worse on exertion better at rest. Denies any recent travel, no previous history of blood clots in her or in her family members, no smoking, no cough phlegm, came to the ER as last night she almost passed out due to shortness of breath, in the ER workup consisted of a high d-dimer. Since she is allergic to IV dye a VQ scan was ordered and nighttime hospitalist team was called to admit the patient.    Review of Systems    In  addition to the HPI above,   No Fever-chills, No Headache, No changes with Vision or hearing, No problems swallowing food or Liquids, +ve Chest pain & Shortness of Breath, No Abdominal pain, No Nausea or Vommitting, Bowel movements are regular, No Blood in stool or Urine, No dysuria, No new skin rashes or bruises, No new joints pains-aches,  No new weakness, tingling, numbness in any extremity, No recent weight gain or loss, No polyuria, polydypsia or polyphagia, No significant Mental Stressors.  A full 10 point Review of Systems was done, except as stated above, all other Review of Systems were negative.   Social History History  Substance Use Topics  . Smoking status: Never Smoker   . Smokeless tobacco: Never Used  . Alcohol Use: No      Family History Family History  Problem Relation Age of Onset  . Heart disease Father   . Kidney disease Father   . Peripheral vascular disease Father   . Heart attack Father     59  . Diabetes Mother   . Arthritis Mother       Prior to Admission medications   Medication Sig Start Date End Date Taking? Authorizing Provider  aspirin 81 MG  chewable tablet Chew 324 mg by mouth once.   Yes Historical Provider, MD  Calcium Carb-Cholecalciferol (CALCIUM 1000 + D PO) Take 1,000 mg by mouth daily.     Yes Historical Provider, MD  Cefixime 200 MG CHEW Chew 400 mg by mouth daily. For 10 days 06/08/13 06/18/13 Yes Historical Provider, MD  furosemide (LASIX) 20 MG tablet Take 20 mg by mouth daily.   Yes Historical Provider, MD  Linaclotide (LINZESS) 145 MCG CAPS capsule Take 145 mcg by mouth daily as needed (for IBS pain).   Yes Historical Provider, MD  Multiple Vitamins-Minerals (MULTIVITAMINS THER. W/MINERALS) TABS Take 1 tablet by mouth daily.     Yes Historical Provider, MD  omeprazole (PRILOSEC) 20 MG capsule Take 20 mg by mouth 2 (two) times daily before a meal.   Yes Historical Provider, MD    Allergies  Allergen Reactions  . Ancef [Cefazolin Sodium] Nausea And Vomiting  . Contrast Media [Iodinated Diagnostic Agents]     apnea  . Penicillins Other (See Comments)    Patient does not recall the reaction, it happened when she was a child    Physical Exam  Vitals  Blood pressure 127/78, pulse 100, temperature 98.1 F (36.7 C), temperature source Oral, resp. rate 33, height 5\' 6"  (1.676 m), weight 125.193 kg (276 lb), SpO2 93.00%.   1. General middle-aged Caucasian female lying in bed in NAD,    2. Normal affect and insight, Not Suicidal or Homicidal, Awake Alert, Oriented X 3.  3. No F.N deficits, ALL C.Nerves Intact, Strength 5/5 all 4 extremities, Sensation intact all 4 extremities, Plantars down going.  4. Ears and Eyes appear Normal, Conjunctivae clear, PERRLA. Moist Oral Mucosa.  5. Supple Neck, No JVD, No cervical lymphadenopathy appriciated, No Carotid Bruits.  6. Symmetrical Chest wall movement, Good air movement bilaterally, CTAB.  7. RRR, No Gallops, Rubs or Murmurs, No Parasternal Heave.  8. Positive Bowel Sounds, Abdomen Soft, Non tender, No organomegaly appriciated,No rebound -guarding or rigidity.  9.  No  Cyanosis, Normal Skin Turgor, No Skin Rash or Bruise.  10. Good muscle tone,  joints appear normal , no effusions, Normal ROM.  11. No Palpable Lymph Nodes in Neck or Axillae     Data Review  CBC  Recent  Labs Lab 06/11/13 0525 06/11/13 0530  WBC 6.2  --   HGB 14.6 15.6*  HCT 44.1 46.0  PLT 156  --   MCV 88.7  --   MCH 29.4  --   MCHC 33.1  --   RDW 13.9  --    ------------------------------------------------------------------------------------------------------------------  Chemistries   Recent Labs Lab 06/11/13 0530  NA 141  K 4.0  CL 99  GLUCOSE 107*  BUN 12  CREATININE 0.70   ------------------------------------------------------------------------------------------------------------------ estimated creatinine clearance is 108.8 ml/min (by C-G formula based on Cr of 0.7). ------------------------------------------------------------------------------------------------------------------ No results found for this basename: TSH, T4TOTAL, FREET3, T3FREE, THYROIDAB,  in the last 72 hours   Coagulation profile No results found for this basename: INR, PROTIME,  in the last 168 hours -------------------------------------------------------------------------------------------------------------------  Recent Labs  06/11/13 0543  DDIMER 13.60*   -------------------------------------------------------------------------------------------------------------------  Cardiac Enzymes  Recent Labs Lab 06/11/13 0543  TROPONINI 0.44*   ------------------------------------------------------------------------------------------------------------------ No components found with this basename: POCBNP,    ---------------------------------------------------------------------------------------------------------------  Urinalysis    Component Value Date/Time   COLORURINE AMBER* 11/30/2010 1739   APPEARANCEUR CLOUDY* 11/30/2010 1739   LABSPEC 1.028 11/30/2010 1739   PHURINE  5.0 11/30/2010 1739   GLUCOSEU NEGATIVE 11/30/2010 1739   HGBUR NEGATIVE 11/30/2010 1739   BILIRUBINUR SMALL* 11/30/2010 1739   KETONESUR >80* 11/30/2010 1739   PROTEINUR 30* 11/30/2010 1739   UROBILINOGEN 0.2 11/30/2010 1739   NITRITE NEGATIVE 11/30/2010 1739   LEUKOCYTESUR SMALL* 11/30/2010 1739    ----------------------------------------------------------------------------------------------------------------  Imaging results:   Dg Chest Portable 1 View  06/11/2013   CLINICAL DATA:  Chest pain and shortness of breath  EXAM: PORTABLE CHEST - 1 VIEW  COMPARISON:  11/09/2009  FINDINGS: Borderline cardiomegaly, accentuated by technique. Unchanged upper mediastinal contours. Prominent markings at the bases without overt edema. No consolidation, effusion, or pneumothorax.  IMPRESSION: No active disease.   Electronically Signed   By: Jorje Guild M.D.   On: 06/11/2013 06:08    My personal review of EKG: Rhythm S.tachy, Rate  111/min,  no Acute ST changes    Assessment & Plan   1. Acute hypoxic respiratory failure with elevated d-dimer. History highly suspicious for PE. Patient was premedicated as she has IV dye allergy, CT angiogram was obtained and V/Q scan was canceled- acute submassive PE has been noted on CT angiogram have discussed with radiologist, there is right heart strain which explains mildly elevated troponin. Will be admitted to step down, full dose Lovenox has already been initiated. We'll consult critical care and monitor the patient and stepped-down. Will check lower extremity duplex. Outpatient hypercoagulable workup with oncologist post discharge.    2. History of varicose veins. Wear his TED stockings at all times which will be continued, will obtain lower extremity venous duplex.    3. Diabetes mellitus type 2. Currently in diet control after gastric bypass surgery. No acute issue.    4. Mildly elevated troponin. This is not a ACS, this is secondary to right  heart strain caused by the submassive PE. Continue Lovenox. Low-dose aspirin which he takes at home will be continued.     DVT Prophylaxis   Lovenox    AM Labs Ordered, also please review Full Orders  Family Communication: Admission, patients condition and plan of care including tests being ordered have been discussed with the patient and husband who indicate understanding and agree with the plan and Code Status.  Code Status Full  Likely DC to  Home  Condition GUARDED  Total Critical Care time in examining the patient bedside, evaluating Lab work and other data, over half of the total time was spent in coordinating patient care on the floor or bedisde in talking to patient/family members, communicating with nursing Staff on the floor and sub specialists Critical Care  to coordinate patients medical care and needs is 45  Minutes.   The condition which has caused critical injury/acute impairment of Cardio-Pulm vital organ system with a high probability of sudden clinically significant deterioration and can cause Potential Life threatening injury to this patient addressed today is Acute Massive PE with Acute Resp Failure.    Thurnell Lose M.D on 06/11/2013 at 12:29 PM  Between 7am to 7pm - Pager - [phone]  After 7pm go to [email or web address] - password [email or web address]  And look for the night coverage person covering me after hours  [facility] Office  [phone]

## 2013-06-11 NOTE — Sedation Documentation (Signed)
V tach noted with wire introduction resolved

## 2013-06-11 NOTE — ED Provider Notes (Signed)
CSN: [id number]     Arrival date & time 06/11/13  5784 History   First MD Initiated Contact with Patient 06/11/13 0540     Chief Complaint  Patient presents with  . Chest Pain     (Consider location/radiation/quality/duration/timing/severity/associated sxs/prior Treatment) HPI Hx per PT - worsening dyspnea over the last week. Has seen PCP, had ECHO and CXR.  Symptoms worsening with episode of syncope last night. Having some mid CP. No F/C.  Symptoms worse with exertion. No cough. No hemoptysis. No leg pain or swelling, no h/o PE.   Past Medical History  Diagnosis Date  . Leg swelling 03-24-13    not a problem any longer  . Difficulty in swallowing 03-24-13    no longer a problem  . Arthritis   . Bruises easily   . Reflux   . Shortness of breath   . Blood transfusion   . GERD (gastroesophageal reflux disease)   . Peripheral vascular disease   . Anemia   . Diabetes mellitus     Pt had DM prior to Lap Band. Does not have it now per pt.  . Bleeding ulcer 12-2010  hospitalized for 1 week  . Constipation   . Vomiting 03-24-13    problem resolved after lap band revision  . Blood transfusion without reported diagnosis     2011-2 units transfused, bleeding ulcer  . Hyperlipidemia    Past Surgical History  Procedure Laterality Date  . Laparoscopic gastric banding  01-05-2008  . Endovenous ablation saphenous vein w/ laser  06-2008  . Salphingooophorectomy Right     cyst removal  . Wedge resection of ovary  LEFT OVARY   . Carpel tunnel bilatereal    . Diagnostic laparoscopy    . Appendectomy    . Laparoscopic gastric banding  12/18/2010    01/04/2009  . Esophagogastroduodenoscopy  12/18/2010    Procedure: ESOPHAGOGASTRODUODENOSCOPY (EGD);  Surgeon: Gatha Mayer, MD;  Location: [facility];  Service: Endoscopy;  Laterality: N/A;  . Tonsillectomy    . Cholecystectomy      5'12  . Colonoscopy N/A 04/10/2013    Procedure: COLONOSCOPY;  Surgeon: Beryle Beams, MD;  Location: [facility];  Service: Endoscopy;  Laterality: N/A;   Family History  Problem Relation Age of Onset  . Heart disease Father   . Kidney disease Father   . Peripheral vascular disease Father   . Heart attack Father     2  . Diabetes Mother   . Arthritis Mother    History  Substance Use Topics  . Smoking status: Never Smoker   . Smokeless tobacco: Never Used  . Alcohol Use: No   OB History   Grav Para Term Preterm Abortions TAB SAB Ect Mult Living                 Review of Systems  Constitutional: Negative for fever and chills.  Eyes: Negative for visual disturbance.  Respiratory: Positive for shortness of breath.   Cardiovascular: Positive for chest pain.  Gastrointestinal: Negative for vomiting and abdominal pain.  Genitourinary: Negative for dysuria.  Musculoskeletal: Negative for back pain, neck pain and neck stiffness.  Skin: Negative for rash.  Neurological: Negative for headaches.  All other systems reviewed and are negative.     Allergies  Ancef; Contrast media; and Penicillins  Home Medications   Prior to Admission medications   Medication Sig Start Date End Date Taking? Authorizing Provider  aspirin 81 MG chewable tablet Chew 324  mg by mouth once.   Yes Historical Provider, MD  Calcium Carb-Cholecalciferol (CALCIUM 1000 + D PO) Take 1,000 mg by mouth daily.     Yes Historical Provider, MD  Cefixime 200 MG CHEW Chew 400 mg by mouth daily. For 10 days 06/08/13 06/18/13 Yes Historical Provider, MD  furosemide (LASIX) 20 MG tablet Take 20 mg by mouth daily.   Yes Historical Provider, MD  Linaclotide (LINZESS) 145 MCG CAPS capsule Take 145 mcg by mouth daily as needed (for IBS pain).   Yes Historical Provider, MD  Multiple Vitamins-Minerals (MULTIVITAMINS THER. W/MINERALS) TABS Take 1 tablet by mouth daily.     Yes Historical Provider, MD  omeprazole (PRILOSEC) 20 MG capsule Take 20 mg by mouth 2 (two) times daily before a meal.   Yes Historical Provider, MD   BP  110/73  Pulse 103  Temp(Src) 98 F (36.7 C) (Oral)  Resp 24  Ht 5\' 6"  (1.676 m)  Wt 276 lb (125.193 kg)  BMI 44.57 kg/m2  SpO2 94% Physical Exam  Constitutional: She is oriented to person, place, and time. She appears well-developed and well-nourished.  HENT:  Head: Normocephalic and atraumatic.  Eyes: EOM are normal. Pupils are equal, round, and reactive to light.  Neck: Neck supple.  Cardiovascular: Regular rhythm and intact distal pulses.   Tachycardic  Pulmonary/Chest: She exhibits no tenderness.  Tachypnea with equal bilateral breath sounds, some accessory muscle use  Abdominal: Soft. Bowel sounds are normal. She exhibits no distension. There is no tenderness.  Musculoskeletal: Normal range of motion. She exhibits no tenderness.  Neurological: She is alert and oriented to person, place, and time.  Skin: Skin is warm and dry.    ED Course  Procedures (including critical care time) Labs Review Labs Reviewed  TROPONIN I - Abnormal; Notable for the following:    Troponin I 0.44 (*)    All other components within normal limits  D-DIMER, QUANTITATIVE - Abnormal; Notable for the following:    D-Dimer, Quant 13.60 (*)    All other components within normal limits  I-STAT CHEM 8, ED - Abnormal; Notable for the following:    Glucose, Bld 107 (*)    Hemoglobin 15.6 (*)    All other components within normal limits  I-STAT TROPOININ, ED - Abnormal; Notable for the following:    Troponin i, poc 0.14 (*)    All other components within normal limits  CBC  PRO B NATRIURETIC PEPTIDE    Imaging Review Dg Chest Portable 1 View  06/11/2013   CLINICAL DATA:  Chest pain and shortness of breath  EXAM: PORTABLE CHEST - 1 VIEW  COMPARISON:  11/09/2009  FINDINGS: Borderline cardiomegaly, accentuated by technique. Unchanged upper mediastinal contours. Prominent markings at the bases without overt edema. No consolidation, effusion, or pneumothorax.  IMPRESSION: No active disease.    Electronically Signed   By: Jorje Guild M.D.   On: 06/11/2013 06:08     EKG Interpretation   Date/Time:  Thursday Jun 11 2013 05:19:38 EDT Ventricular Rate:  111 PR Interval:  132 QRS Duration: 92 QT Interval:  364 QTC Calculation: 495 R Axis:   23 Text Interpretation:  Sinus tachycardia Low voltage QRS Nonspecific ST  abnormality Abnormal ECG Confirmed by Damyn Weitzel  MD, Evely Gainey ([id number]) on 06/11/2013  5:41:53 AM     CRITICAL CARE Performed by: Teressa Lower Total critical care time: 30 Critical care time was exclusive of separately billable procedures and treating other patients. Critical care was necessary to treat  or prevent imminent or life-threatening deterioration. Critical care was time spent personally by me on the following activities: development of treatment plan with patient and/or surrogate as well as nursing, discussions with consultants, evaluation of patient's response to treatment, examination of patient, obtaining history from patient or surrogate, ordering and performing treatments and interventions, ordering and review of laboratory studies, ordering and review of radiographic studies, pulse oximetry and re-evaluation of patient's condition.  Aspirin IV morphine provided. oxygen provided Discussed with radiology and VQ scan unavailable until released 9:30 AM Discussed with hospitalist on-call, Dr. Shanon Brow will admit to step down unit, recommend start Lovenox now. [facility] also was consulted and will follow.   MDM   Diagnosis: Dyspnea, elevated troponin  Presentation concerning for possible PE. Presents with dyspnea, tachycardia. elevated d-dimer and troponin. Anaphylaxis allergy to contrast dye/ PT states I stop breathing. V/Q ordered Labs and imaging reviewed as above. Serial evaluations and patient stated. She remains on oxygen still tachypneic, pending admission      Teressa Lower, MD 06/13/13 [phone]

## 2013-06-11 NOTE — ED Notes (Signed)
Pt. reports mid chest pain onset yesterday with SOB and syncopal episode last night , no nausea or diaphoresis .

## 2013-06-11 NOTE — Care Management Note (Addendum)
    Page 1 of 1   06/12/2013     4:02:55 PM CARE MANAGEMENT NOTE 06/12/2013  Patient:  [name], [name]   Account Number:  [id number]  Date Initiated:  06/11/2013  Documentation initiated by:  Elissa Hefty  Subjective/Objective Assessment:   adm w pul embolus     Action/Plan:   lives w husband, pcp dr Michel Bickers   Anticipated DC Date:     Anticipated DC Plan:           Choice offered to / List presented to:             Status of service:   Medicare Important Message given?   (If response is "NO", the following Medicare IM given date fields will be blank) Date Medicare IM given:   Date Additional Medicare IM given:    Discharge Disposition:    Per UR Regulation:  Reviewed for med. necessity/level of care/duration of stay  If discussed at [place] of Stay Meetings, dates discussed:    Comments:  06-12-13 4pm Luz Lex, RN BSN [phone] patient can go over to [place] and they will give her [employer] cards for discounts.  First 30 days free. Then decreased co pay per month for 1 year.

## 2013-06-11 NOTE — ED Notes (Signed)
Pt states she feels better after being placed on 2L of O2. Pt wants to hold off on Morphine but will tell if there is more pain increase.

## 2013-06-11 NOTE — H&P (Signed)
Agree.  Patient seen and examined.  Echo shows evidence of right heart strain and elevated PA pressures.  Also shows previously unknown ASD.  Lower extremity ultrasound shows right posterior tibial DVT.  Patient is a good candidate for ultrasound assisted transcatheter lytic therapy of bilateral pulmonary embolism.  Risks and benefits discussed with patient who is agreeable to proceed.

## 2013-06-11 NOTE — ED Notes (Signed)
Portable at bedside 

## 2013-06-11 NOTE — ED Notes (Signed)
Spoke with QV scan, unable to take patient until 09:30. EDP aware.

## 2013-06-11 NOTE — Sedation Documentation (Signed)
PA pressure 50/25 mean 36

## 2013-06-11 NOTE — Progress Notes (Signed)
ANTICOAGULATION CONSULT NOTE - Initial Consult  Pharmacy Consult for Heparin Indication: pulmonary embolus and DVT  Allergies  Allergen Reactions  . Ancef [Cefazolin Sodium] Nausea And Vomiting  . Contrast Media [Iodinated Diagnostic Agents]     apnea  . Penicillins Other (See Comments)    Patient does not recall the reaction, it happened when she was a child   Patient Measurements: Height: 5\' 6"  (167.6 cm) Weight: 276 lb (125.193 kg) IBW/kg (Calculated) : 59.3 Heparin Dosing Weight: 80 kg  Vital Signs: Temp: 98.2 F (36.8 C) (05/07 2000) Temp src: Oral (05/07 2000) BP: 124/78 mmHg (05/07 1812) Pulse Rate: 80 (05/07 2100)  Labs:  Recent Labs  06/11/13 0525 06/11/13 0530 06/11/13 0543 06/11/13 1317 06/11/13 1800 06/11/13 2030  HGB 14.6 15.6*  --   --  14.2  --   HCT 44.1 46.0  --   --  42.4  --   PLT 156  --   --   --  136*  --   APTT  --   --   --   --  82*  --   LABPROT  --   --   --  14.5  --   --   INR  --   --   --  1.15  --   --   HEPARINUNFRC  --   --   --   --   --  0.75*  CREATININE  --  0.70  --   --   --   --   TROPONINI  --   --  0.44*  --  0.40*  --    Estimated Creatinine Clearance: 108.8 ml/min (by C-G formula based on Cr of 0.7).  Medical History: Past Medical History  Diagnosis Date  . Leg swelling 03-24-13    not a problem any longer  . Difficulty in swallowing 03-24-13    no longer a problem  . Arthritis   . Bruises easily   . Reflux   . Shortness of breath   . Blood transfusion   . GERD (gastroesophageal reflux disease)   . Peripheral vascular disease   . Anemia   . Diabetes mellitus     Pt had DM prior to Lap Band. Does not have it now per pt.  . Bleeding ulcer 12-2010  hospitalized for 1 week  . Constipation   . Vomiting 03-24-13    problem resolved after lap band revision  . Blood transfusion without reported diagnosis     2011-2 units transfused, bleeding ulcer  . Hyperlipidemia    Assessment: 53 yo female with diagnosis of  PE and DVT.  She is s/p tPA infusion that is on going for 12 hr. Her heparin level tonight is 0.75 which is slightly supratherapeutic. This could be due to the fact that she did get one dose of lovenox this AM. Will adjust the rate and f/u in AM with plan for heparin, tPA, and cath removal. Will try to shoot for goal around 0.3-0.5 if possible  Goal of Therapy:  Heparin level 0.3-0.7 units/ml Monitor platelets by anticoagulation protocol: Yes   Plan:   Decrease heparin to 1450 units/hr F/u with level and CBC in AM F/u with resumption of heparin

## 2013-06-12 ENCOUNTER — Inpatient Hospital Stay (HOSPITAL_COMMUNITY): Payer: 59

## 2013-06-12 DIAGNOSIS — I509 Heart failure, unspecified: Secondary | ICD-10-CM

## 2013-06-12 DIAGNOSIS — I50810 Right heart failure, unspecified: Secondary | ICD-10-CM

## 2013-06-12 LAB — BASIC METABOLIC PANEL
BUN: 14 mg/dL (ref 6–23)
CO2: 25 mEq/L (ref 19–32)
Calcium: 8.3 mg/dL — ABNORMAL LOW (ref 8.4–10.5)
Chloride: 107 mEq/L (ref 96–112)
Creatinine, Ser: 0.47 mg/dL — ABNORMAL LOW (ref 0.50–1.10)
GLUCOSE: 129 mg/dL — AB (ref 70–99)
POTASSIUM: 3.9 meq/L (ref 3.7–5.3)
SODIUM: 142 meq/L (ref 137–147)

## 2013-06-12 LAB — CBC
HCT: 39.6 % (ref 36.0–46.0)
HEMATOCRIT: 39.5 % (ref 36.0–46.0)
HEMATOCRIT: 41.4 % (ref 36.0–46.0)
HEMOGLOBIN: 12.6 g/dL (ref 12.0–15.0)
Hemoglobin: 13.3 g/dL (ref 12.0–15.0)
Hemoglobin: 13.5 g/dL (ref 12.0–15.0)
MCH: 29.5 pg (ref 26.0–34.0)
MCH: 28.3 pg (ref 26.0–34.0)
MCH: 28.9 pg (ref 26.0–34.0)
MCHC: 33.6 g/dL (ref 30.0–36.0)
MCHC: 31.9 g/dL (ref 30.0–36.0)
MCHC: 32.6 g/dL (ref 30.0–36.0)
MCV: 87.8 fL (ref 78.0–100.0)
MCV: 88.8 fL (ref 78.0–100.0)
MCV: 88.7 fL (ref 78.0–100.0)
PLATELETS: 137 10*3/uL — AB (ref 150–400)
PLATELETS: 191 10*3/uL (ref 150–400)
Platelets: 159 10*3/uL (ref 150–400)
RBC: 4.51 MIL/uL (ref 3.87–5.11)
RBC: 4.45 MIL/uL (ref 3.87–5.11)
RBC: 4.67 MIL/uL (ref 3.87–5.11)
RDW: 13.5 % (ref 11.5–15.5)
RDW: 13.4 % (ref 11.5–15.5)
RDW: 13.7 % (ref 11.5–15.5)
WBC: 5.4 10*3/uL (ref 4.0–10.5)
WBC: 9.7 10*3/uL (ref 4.0–10.5)
WBC: 14.4 10*3/uL — AB (ref 4.0–10.5)

## 2013-06-12 LAB — FIBRINOGEN
FIBRINOGEN: 444 mg/dL (ref 204–475)
Fibrinogen: 468 mg/dL (ref 204–475)
Fibrinogen: 418 mg/dL (ref 204–475)
Fibrinogen: 393 mg/dL (ref 204–475)

## 2013-06-12 LAB — ANTITHROMBIN III: ANTITHROMB III FUNC: 69 % — AB (ref 75–120)

## 2013-06-12 LAB — TROPONIN I: Troponin I: <0.3 ng/mL (ref <0.30)

## 2013-06-12 LAB — HEPARIN LEVEL (UNFRACTIONATED): Heparin Unfractionated: 0.52 IU/mL (ref 0.30–0.70)

## 2013-06-12 MED ORDER — ALUM & MAG HYDROXIDE-SIMETH 200-200-20 MG/5ML PO SUSP
15.0000 mL | Freq: Four times a day (QID) | ORAL | Status: DC | PRN
  Filled 2013-06-12: qty 30

## 2013-06-12 MED ORDER — PANTOPRAZOLE SODIUM 40 MG PO TBEC
40.0000 mg | DELAYED_RELEASE_TABLET | Freq: Every day | ORAL | Status: DC
  Administered 2013-06-12 – 2013-06-14 (×3): 40 mg via ORAL
  Filled 2013-06-12 (×3): qty 1

## 2013-06-12 MED ORDER — HYDROMORPHONE HCL PF 1 MG/ML IJ SOLN
1.0000 mg | Freq: Once | INTRAMUSCULAR | Status: AC
  Administered 2013-06-12: 1 mg via INTRAVENOUS
  Filled 2013-06-12: qty 1

## 2013-06-12 MED ORDER — DIAZEPAM 2 MG PO TABS
2.0000 mg | ORAL_TABLET | Freq: Four times a day (QID) | ORAL | Status: DC | PRN
  Administered 2013-06-13: 2 mg via ORAL
  Filled 2013-06-12: qty 1

## 2013-06-12 NOTE — Procedures (Signed)
Lysis check: pressures R PA 36/20(26)  L PA 34/21(25) mmHg R groin hematoma Feeling much better, no longer SOB. Pulm art infusion catheters removed. Will remove R femoral sheaths after heparin off x4 hours. CTA 48h p lysis.

## 2013-06-12 NOTE — Progress Notes (Signed)
Interventional Radiology here at bedside to remove 2  6Fr right femoral vein groin sheaths and 1 4Fr right femoral artery sheath.  Pt identified by name and DOB, timeout procedure done.  Pt has large hematoma at the insertion site of sheaths. Vein sheaths removed pressure held for 19mins each. Hemostasis achieved. Arterial sheath removed at 1510, pressure held for 20 mins. Hemostasis achieved. V-Pad applied with Tegaderm and pressure dressing.  Pt educated regarding post sheath removal safety.  Hematoma marked with sterile skin marker.  RN at bedside to evaluate groin site Distal right leg distal pulses DP +3, PT +2.   jkc/mja

## 2013-06-12 NOTE — Progress Notes (Signed)
ANTICOAGULATION CONSULT NOTE - Follow Up Consult  Pharmacy Consult for heparin Indication: pulmonary embolus and DVT  Labs:  Recent Labs  06/11/13 0525 06/11/13 0530 06/11/13 0543 06/11/13 1317 06/11/13 1800 06/11/13 2030 06/12/13 0024 06/12/13 0240  HGB 14.6 15.6*  --   --  14.2  --   --  13.3  HCT 44.1 46.0  --   --  42.4  --   --  39.6  PLT 156  --   --   --  136*  --   --  137*  APTT  --   --   --   --  82*  --   --   --   LABPROT  --   --   --  14.5  --   --   --   --   INR  --   --   --  1.15  --   --   --   --   HEPARINUNFRC  --   --   --   --   --  0.75*  --  0.52  CREATININE  --  0.70  --   --   --   --   --   --   TROPONINI  --   --  0.44*  --  0.40*  --  <0.30  --      Assessment/Plan:  53yo female therapeutic on heparin after rate change. Will continue gtt at current rate and f/u with holding/resuming heparin for catheter removal post-tPA.   Wynona Neat, PharmD, BCPS  06/12/2013,3:42 AM

## 2013-06-12 NOTE — Progress Notes (Signed)
Accompanied patient during transport to IR at [place].  Olevia Perches, RN

## 2013-06-12 NOTE — Progress Notes (Signed)
   Name: [name] MRN: [id number] DOB: 1960-05-18    ADMISSION DATE:  06/11/2013 CONSULTATION DATE:  06/11/2013  REFERRING MD :  EDP PRIMARY SERVICE:  TRH > PPCM  CHIEF COMPLAINT:  Chest pain  BRIEF PATIENT DESCRIPTION:  53 yo female with PHM of gastric bypass, GIB, and GERD presented to [facility] ED 5/7 c/o several weeks SOB. 5/6 she began having chest pain and had a syncopal episode. In ED CTA showed acute submassive PE with evidence of R heart strain. PCCM asked to consult.  SIGNIFICANT EVENTS / STUDIES:  5/7 CTA chest: acute PE with CT evidence of right heart strain consistent with at least submassive PE. Scattered small bilateral peripheral pulmonary infarcts.  5/07 Echocardiogram: LVEF normal. RV moderately dilated, RA markedly dilated. Small bidirectional ASD. PASP est 41 mmHg 5/07 Venous duplex scans: acute, non occlusive DVT noted in the right posterior tibial vein of the right lower extremity. All other veins appear thrombus free 5/07 ultrasound assisted transcatheter lytic therapy per IR  5/08 R femoral vein sheath removed   LINES / TUBES: R femoral art line (incidental cannulation) 5/07 >> 5/08 R femoral venous sheath 5/07 >> 5/08  CULTURES: None  ANTIBIOTICS: None   SUBJECTIVE:  No new complaints. No overt distress  VITAL SIGNS: Temp:  [97.7 F (36.5 C)-98.2 F (36.8 C)] 97.8 F (36.6 C) (05/08 1200) Pulse Rate:  [56-93] 73 (05/08 1100) Resp:  [14-34] 26 (05/08 1100) BP: (105-137)/(54-93) 123/71 mmHg (05/08 1100) SpO2:  [88 %-99 %] 88 % (05/08 1100) Arterial Line BP: (118-159)/(59-145) 147/79 mmHg (05/08 0800)  PHYSICAL EXAMINATION: General: Obese female, mild distress at times Neuro:  Alert, oriented, nonfocal HEENT:  [place]/AT, PERRL, no JVD noted Cardiovascular:  RRR Lungs:  Clear anterior Abdomen:  Obese Musculoskeletal: No acute deformity or ROM limitation Skin:  Intact   I have reviewed all of today's lab results. Relevant abnormalities are  discussed in the A/P section    ASSESSMENT / PLAN: Acute hypoxic respiratory failure  Submassive PE with R heart strain  Unprovoked RLE DVT Troponin elevation due to acute PE Hx GERD/PUD S/p Lap-Band procedure  Plan:  Catheter removed today - bedrest X 4 hrs after removal Watch in ICU for at least 24 hrs after fibrinolytic therapy completed (stopped 5/08 @ 0330) Bleeding precautions Hypercoag panel ordered 5/08 Minimum of 6 months anticoagulation - discussed possible need for lifelong therapy Discussed options for long term anticoagulation - she prefers Xarelto depending on insurance coverage Advance diet   If no complications today, transfer out of ICU 5/09 and back to [place] should follow to help make final decisions re: duration of anticoagulation and medication choice  Merton Border, MD ; [facility] [phone].  After 5:30 PM or weekends, call [phone]

## 2013-06-13 LAB — BASIC METABOLIC PANEL
BUN: 17 mg/dL (ref 6–23)
CO2: 26 meq/L (ref 19–32)
CREATININE: 0.53 mg/dL (ref 0.50–1.10)
Calcium: 8.2 mg/dL — ABNORMAL LOW (ref 8.4–10.5)
Chloride: 107 mEq/L (ref 96–112)
GFR calc Af Amer: >90 mL/min (ref >90)
Glucose, Bld: 90 mg/dL (ref 70–99)
Potassium: 4.3 mEq/L (ref 3.7–5.3)
SODIUM: 144 meq/L (ref 137–147)

## 2013-06-13 LAB — CBC
HCT: 38.1 % (ref 36.0–46.0)
Hemoglobin: 12.3 g/dL (ref 12.0–15.0)
MCH: 28.7 pg (ref 26.0–34.0)
MCHC: 32.3 g/dL (ref 30.0–36.0)
MCV: 89 fL (ref 78.0–100.0)
PLATELETS: 158 10*3/uL (ref 150–400)
RBC: 4.28 MIL/uL (ref 3.87–5.11)
RDW: 13.6 % (ref 11.5–15.5)
WBC: 9.8 10*3/uL (ref 4.0–10.5)

## 2013-06-13 LAB — HEPARIN LEVEL (UNFRACTIONATED)

## 2013-06-13 LAB — HOMOCYSTEINE: HOMOCYSTEINE-NORM: 6.1 umol/L (ref 4.0–15.4)

## 2013-06-13 MED ORDER — PREDNISONE 50 MG PO TABS
50.0000 mg | ORAL_TABLET | Freq: Once | ORAL | Status: AC
  Administered 2013-06-14: 50 mg via ORAL
  Filled 2013-06-13 (×2): qty 1

## 2013-06-13 MED ORDER — DIPHENHYDRAMINE HCL 50 MG PO CAPS
50.0000 mg | ORAL_CAPSULE | Freq: Once | ORAL | Status: AC
  Administered 2013-06-14: 50 mg via ORAL
  Filled 2013-06-13: qty 1
  Filled 2013-06-13: qty 2

## 2013-06-13 MED ORDER — RIVAROXABAN 15 MG PO TABS
15.0000 mg | ORAL_TABLET | Freq: Two times a day (BID) | ORAL | Status: DC
Start: 2013-06-13 — End: 2013-06-15
  Administered 2013-06-13 – 2013-06-15 (×5): 15 mg via ORAL
  Filled 2013-06-13 (×7): qty 1

## 2013-06-13 MED ORDER — PREDNISONE 50 MG PO TABS
50.0000 mg | ORAL_TABLET | Freq: Once | ORAL | Status: AC
  Administered 2013-06-13: 50 mg via ORAL
  Filled 2013-06-13 (×2): qty 1

## 2013-06-13 NOTE — Progress Notes (Signed)
Name: [name] MRN: [id number] DOB: [birth year]    ADMISSION DATE:  06/11/2013 CONSULTATION DATE:  06/11/2013  REFERRING MD :  EDP PRIMARY SERVICE:  TRH > PCCM>triad  CHIEF COMPLAINT:  Chest pain  BRIEF PATIENT DESCRIPTION:  53 yo female with PHM of gastric bypass, GIB, and GERD presented to [facility] ED 5/7 c/o several weeks SOB. 5/6 she began having chest pain and had a syncopal episode. In ED CTA showed acute submassive PE with evidence of R heart strain. PCCM asked to consult.  SIGNIFICANT EVENTS / STUDIES:  5/7 CTA chest: acute PE with CT evidence of right heart strain consistent with at least submassive PE. Scattered small bilateral peripheral pulmonary infarcts.  5/07 Echocardiogram: LVEF normal. RV moderately dilated, RA markedly dilated. Small bidirectional ASD. PASP est 41 mmHg 5/07 Venous duplex scans: acute, non occlusive DVT noted in the right posterior tibial vein of the right lower extremity. All other veins appear thrombus free 5/07 ultrasound assisted transcatheter lytic therapy per IR  5/08 R femoral vein sheath removed 5/8 mild chest discomfort  LINES / TUBES: R femoral art line (incidental cannulation) 5/07 >> 5/08 R femoral venous sheath 5/07 >> 5/08  CULTURES: None  ANTIBIOTICS: None   SUBJECTIVE:  Mild chest discomfort  VITAL SIGNS: Temp:  [97.4 F (36.3 C)-97.9 F (36.6 C)] 97.8 F (36.6 C) (05/09 0751) Pulse Rate:  [62-84] 62 (05/09 0000) Resp:  [14-26] 16 (05/09 0000) BP: (102-125)/(55-89) 102/56 mmHg (05/09 0000) SpO2:  [88 %-97 %] 95 % (05/09 0000)  PHYSICAL EXAMINATION: General: Obese female, mild distress at times Neuro:  Alert, oriented, nonfocal HEENT:  [place]egino Ramirez/AT, PERRL, no JVD noted Cardiovascular:  RRR Lungs:  Clear anterior, diminished in bases  Abdomen:  Obese Musculoskeletal: No acute deformity or ROM limitation Skin:  Intact    Recent Labs Lab 06/11/13 0530 06/12/13 0330 06/13/13 0304  NA 141 142 144  K 4.0 3.9 4.3  CL  99 107 107  CO2  --  25 26  BUN 12 14 17   CREATININE 0.70 0.47* 0.53  GLUCOSE 107* 129* 90    Recent Labs Lab 06/12/13 1120 06/12/13 1940 06/13/13 0304  HGB 12.6 13.5 12.3  HCT 39.5 41.4 38.1  WBC 9.7 14.4* 9.8  PLT 159 191 158    Ct Angio Chest Pe W/cm &/or Wo Cm  06/11/2013   CLINICAL DATA:  53 year old female with chest pain and shortness of Breath. Initial encounter.  EXAM: CT ANGIOGRAPHY CHEST WITH CONTRAST  TECHNIQUE: Multidetector CT imaging of the chest was performed using the standard protocol during bolus administration of intravenous contrast. Multiplanar CT image reconstructions and MIPs were obtained to evaluate the vascular anatomy.  CONTRAST:  8mL OMNIPAQUE IOHEXOL 350 MG/ML SOLN  COMPARISON:  Chest CTA 11/09/2009.  FINDINGS: Good contrast bolus timing in the pulmonary arterial tree. Saddle embolus with bilateral central pulmonary artery filling defects. Bilateral upper and lower lobe clot propagating into the segmental and subsegmental arteries.  Right ventricle to left ventricle ratio calculated at 1.15.  Major airways are patent. Scattered bilateral peripheral sometimes wedge-shaped ground-glass and more confluent pulmonary opacity. Superimposed dependent atelectasis.  No pericardial effusion. Up to only trace bilateral pleural effusions.  No acute osseous abnormality identified.  Negative visualized upper abdominal viscera except for sequelae of gastric banding.  Negative visualized aorta. No mediastinal lymphadenopathy. No axillary lymphadenopathy.  Review of the MIP images confirms the above findings.  IMPRESSION: 1. Positive for acute PE with CTevidence of right heart strain (RV/LV  Ratio = 1.2) consistent with at least submassive (intermediate risk) PE. The presence of right heart strain has been associated with an increased risk of morbidity and mortality. 2. Scattered small bilateral peripheral pulmonary infarcts. Superimposed dependent atelectasis. Only trace pleural  fluid.  Critical Value/emergent results were called by telephone at the time of interpretation on 06/11/2013 at 80 AM to Dr. Regenia Skeeter in the ER who verbally acknowledged these results.   Electronically Signed   By: Lars Pinks M.D.   On: 06/11/2013 11:12   Ir Angiogram Pulmonary Bilateral Selective  06/12/2013   CLINICAL DATA:  Submassive bilateral acute pulmonary embolism with saddle component across the main pulmonary artery bifurcation. Evidence by CTA and echocardiography of associated right heart strain and pulmonary hypertension by echo. The patient has been evaluated and is a candidate for transcatheter thrombolytic infusion therapy.  EXAM: 1. ULTRASOUND GUIDANCE FOR VASCULAR ACCESS OF THE RIGHT COMMON FEMORAL VEIN X 2 2. BILATERAL PULMONARY ARTERIOGRAPHY INCLUDING SELECTIVE CATHETERIZATION OF BOTH LOWER LOBE PULMONARY ARTERIES 3. TRANSCATHETER ULTRASOUND ASSISTED THROMBOLYTIC INFUSION THERAPY OF BILATERAL PULMONARY ARTERIES  COMPARISON:  CTA of the chest on 06/11/2013.  MEDICATIONS: Sedation:  1 mg IV Versed sec, 50 mcg IV fentanyl.  Total Moderate Sedation Time:  38 minutes.  CONTRAST:  62mL OMNIPAQUE IOHEXOL 300 MG/ML  SOLN  FLUOROSCOPY TIME:  12 min and 24 seconds.  PROCEDURE: Prior to the procedure, informed consent was obtained from the patient. Treatment alternatives were discussed in detail. Indications and risks for thrombolytics therapy were discussed including bleeding risk. The patient gave consent to proceed with pulmonary arteriography and planned bilateral thrombolytic infusion therapy.  Both groins were prepped with Betadine and sterilely prepped and draped. Maximal sterile barrier technique was utilized including caps, mask, sterile gowns, sterile gloves, sterile drape, hand hygiene and skin antiseptic.  Ultrasound was used to confirm patency of the right common femoral vein. Under direct ultrasound guidance, access was performed of the right common femoral vein utilizing a micropuncture set.  A 6 French sheath was placed over a wire. Additional parallel access of the common femoral vein was performed with a second puncture under ultrasound guidance. A second 6 French sheath was then placed over a wire. During access, puncture of the right common femoral artery was inadvertently performed. A 4 French sheath was advanced into the right common femoral artery.  A 5 French catheter was advanced via one of the venous sheaths into the inferior vena cava and further into the right atrium and right ventricle. The catheter was then advanced into the main pulmonary artery and initial pulmonary artery pressure readings made. Catheter access was then performed in the right pulmonary artery and selective pulmonary arteriography performed of the right pulmonary artery is well as a lower lobe branch. An exchange length guidewire was then advanced into the right lower lobe pulmonary artery.  Through the second femoral venous sheath, a 5 French catheter was advanced into the IVC, right heart and main pulmonary artery. The catheter was further advanced into the left pulmonary artery over a guidewire and selective arteriography performed. A guidewire was advanced into the left lower lobe pulmonary artery.  A 6 French EKOS infusion catheter was advanced over the right pulmonary arterial guidewire and into the right lower lobe pulmonary artery. An 18 cm distal infusion length catheter was chosen for placement. The coaxial ultrasound assisted infusion fiber was then advanced through the outer catheter.  A second 6 French EKOS infusion catheter was then advanced over the left pulmonary  arterial guidewire into the left lower lobe pulmonary artery. A 12 cm distal infusion length was chosen for placement. The coaxial ultrasound fiber was then advanced through the outer catheter.  Femoral venous and arterial sheaths were connected to saline drips and secured at the skin with Ethilon retention sutures. Infusion catheters were  connected to tPA infusion as well as the EKOS ultrasound infusion system. TPA infusion was begun at 1645 hr via bilateral infusion catheters with a each catheter infusing at a rate of 1 milligram/hour. Infusion will be continued and monitored overnight for a total of 12 hr of infusion via each catheter system. At that time, infusion will be switched to saline and repeat pulmonary artery pressure measurements made tomorrow morning.  COMPLICATIONS: None  FINDINGS: During femoral venous catheterization, there was inadvertent puncture of the femoral artery with advancement of a micropuncture dilator over a guidewire. This necessitated placing a 4 French sheath after arterial access in order to decrease risk of arterial bleeding during thrombolytic therapy.  After initial catheterization of the main pulmonary artery, pressure measurement demonstrates significant elevation of pulmonary artery pressures with measurement of 50/25 mm Hg with mean of 36 mm Hg. Bilateral pulmonary arteriography confirms the presence of significant clot burden extending into lower lobe arteries bilaterally. Infusion catheter systems were able to be well positioned with the right infusion catheter length spanning from the lower lobe pulmonary artery back into the main pulmonary artery. The left pulmonary infusion catheter spans from a lower lobe pulmonary artery back into the proximal left pulmonary artery.  IMPRESSION: Pulmonary artery pressure measurements demonstrate significant elevated pulmonary artery pressure measuring 50/25 mm Hg with mean of 36 mm Hg. Bilateral pulmonary arterial catheter directed ultrasound assistant thrombolytic therapy was begun via EKOS catheters at a rate of 1 mg of tPA per hr. Infusions will be continued bilaterally for a total of 12 hr. Repeat pulmonary artery pressure measurements and catheter removal will be performed tomorrow morning.   Electronically Signed   By: Aletta Edouard M.D.   On: 06/12/2013 12:07    Ir Angiogram Selective Each Additional Vessel  06/12/2013   CLINICAL DATA:  Submassive bilateral acute pulmonary embolism with saddle component across the main pulmonary artery bifurcation. Evidence by CTA and echocardiography of associated right heart strain and pulmonary hypertension by echo. The patient has been evaluated and is a candidate for transcatheter thrombolytic infusion therapy.  EXAM: 1. ULTRASOUND GUIDANCE FOR VASCULAR ACCESS OF THE RIGHT COMMON FEMORAL VEIN X 2 2. BILATERAL PULMONARY ARTERIOGRAPHY INCLUDING SELECTIVE CATHETERIZATION OF BOTH LOWER LOBE PULMONARY ARTERIES 3. TRANSCATHETER ULTRASOUND ASSISTED THROMBOLYTIC INFUSION THERAPY OF BILATERAL PULMONARY ARTERIES  COMPARISON:  CTA of the chest on 06/11/2013.  MEDICATIONS: Sedation:  1 mg IV Versed sec, 50 mcg IV fentanyl.  Total Moderate Sedation Time:  38 minutes.  CONTRAST:  12mL OMNIPAQUE IOHEXOL 300 MG/ML  SOLN  FLUOROSCOPY TIME:  12 min and 24 seconds.  PROCEDURE: Prior to the procedure, informed consent was obtained from the patient. Treatment alternatives were discussed in detail. Indications and risks for thrombolytics therapy were discussed including bleeding risk. The patient gave consent to proceed with pulmonary arteriography and planned bilateral thrombolytic infusion therapy.  Both groins were prepped with Betadine and sterilely prepped and draped. Maximal sterile barrier technique was utilized including caps, mask, sterile gowns, sterile gloves, sterile drape, hand hygiene and skin antiseptic.  Ultrasound was used to confirm patency of the right common femoral vein. Under direct ultrasound guidance, access was performed of the right common  femoral vein utilizing a micropuncture set. A 6 French sheath was placed over a wire. Additional parallel access of the common femoral vein was performed with a second puncture under ultrasound guidance. A second 6 French sheath was then placed over a wire. During access, puncture of the  right common femoral artery was inadvertently performed. A 4 French sheath was advanced into the right common femoral artery.  A 5 French catheter was advanced via one of the venous sheaths into the inferior vena cava and further into the right atrium and right ventricle. The catheter was then advanced into the main pulmonary artery and initial pulmonary artery pressure readings made. Catheter access was then performed in the right pulmonary artery and selective pulmonary arteriography performed of the right pulmonary artery is well as a lower lobe branch. An exchange length guidewire was then advanced into the right lower lobe pulmonary artery.  Through the second femoral venous sheath, a 5 French catheter was advanced into the IVC, right heart and main pulmonary artery. The catheter was further advanced into the left pulmonary artery over a guidewire and selective arteriography performed. A guidewire was advanced into the left lower lobe pulmonary artery.  A 6 French EKOS infusion catheter was advanced over the right pulmonary arterial guidewire and into the right lower lobe pulmonary artery. An 18 cm distal infusion length catheter was chosen for placement. The coaxial ultrasound assisted infusion fiber was then advanced through the outer catheter.  A second 6 French EKOS infusion catheter was then advanced over the left pulmonary arterial guidewire into the left lower lobe pulmonary artery. A 12 cm distal infusion length was chosen for placement. The coaxial ultrasound fiber was then advanced through the outer catheter.  Femoral venous and arterial sheaths were connected to saline drips and secured at the skin with Ethilon retention sutures. Infusion catheters were connected to tPA infusion as well as the EKOS ultrasound infusion system. TPA infusion was begun at 1645 hr via bilateral infusion catheters with a each catheter infusing at a rate of 1 milligram/hour. Infusion will be continued and monitored  overnight for a total of 12 hr of infusion via each catheter system. At that time, infusion will be switched to saline and repeat pulmonary artery pressure measurements made tomorrow morning.  COMPLICATIONS: None  FINDINGS: During femoral venous catheterization, there was inadvertent puncture of the femoral artery with advancement of a micropuncture dilator over a guidewire. This necessitated placing a 4 French sheath after arterial access in order to decrease risk of arterial bleeding during thrombolytic therapy.  After initial catheterization of the main pulmonary artery, pressure measurement demonstrates significant elevation of pulmonary artery pressures with measurement of 50/25 mm Hg with mean of 36 mm Hg. Bilateral pulmonary arteriography confirms the presence of significant clot burden extending into lower lobe arteries bilaterally. Infusion catheter systems were able to be well positioned with the right infusion catheter length spanning from the lower lobe pulmonary artery back into the main pulmonary artery. The left pulmonary infusion catheter spans from a lower lobe pulmonary artery back into the proximal left pulmonary artery.  IMPRESSION: Pulmonary artery pressure measurements demonstrate significant elevated pulmonary artery pressure measuring 50/25 mm Hg with mean of 36 mm Hg. Bilateral pulmonary arterial catheter directed ultrasound assistant thrombolytic therapy was begun via EKOS catheters at a rate of 1 mg of tPA per hr. Infusions will be continued bilaterally for a total of 12 hr. Repeat pulmonary artery pressure measurements and catheter removal will be performed tomorrow morning.  Electronically Signed   By: Aletta Edouard M.D.   On: 06/12/2013 12:07   Ir US Guide Vasc Access Right  06/12/2013   CLINICAL DATA:  Submassive bilateral acute pulmonary embolism with saddle component across the main pulmonary artery bifurcation. Evidence by CTA and echocardiography of associated right heart  strain and pulmonary hypertension by echo. The patient has been evaluated and is a candidate for transcatheter thrombolytic infusion therapy.  EXAM: 1. ULTRASOUND GUIDANCE FOR VASCULAR ACCESS OF THE RIGHT COMMON FEMORAL VEIN X 2 2. BILATERAL PULMONARY ARTERIOGRAPHY INCLUDING SELECTIVE CATHETERIZATION OF BOTH LOWER LOBE PULMONARY ARTERIES 3. TRANSCATHETER ULTRASOUND ASSISTED THROMBOLYTIC INFUSION THERAPY OF BILATERAL PULMONARY ARTERIES  COMPARISON:  CTA of the chest on 06/11/2013.  MEDICATIONS: Sedation:  1 mg IV Versed sec, 50 mcg IV fentanyl.  Total Moderate Sedation Time:  38 minutes.  CONTRAST:  20mL OMNIPAQUE IOHEXOL 300 MG/ML  SOLN  FLUOROSCOPY TIME:  12 min and 24 seconds.  PROCEDURE: Prior to the procedure, informed consent was obtained from the patient. Treatment alternatives were discussed in detail. Indications and risks for thrombolytics therapy were discussed including bleeding risk. The patient gave consent to proceed with pulmonary arteriography and planned bilateral thrombolytic infusion therapy.  Both groins were prepped with Betadine and sterilely prepped and draped. Maximal sterile barrier technique was utilized including caps, mask, sterile gowns, sterile gloves, sterile drape, hand hygiene and skin antiseptic.  Ultrasound was used to confirm patency of the right common femoral vein. Under direct ultrasound guidance, access was performed of the right common femoral vein utilizing a micropuncture set. A 6 French sheath was placed over a wire. Additional parallel access of the common femoral vein was performed with a second puncture under ultrasound guidance. A second 6 French sheath was then placed over a wire. During access, puncture of the right common femoral artery was inadvertently performed. A 4 French sheath was advanced into the right common femoral artery.  A 5 French catheter was advanced via one of the venous sheaths into the inferior vena cava and further into the right atrium and right  ventricle. The catheter was then advanced into the main pulmonary artery and initial pulmonary artery pressure readings made. Catheter access was then performed in the right pulmonary artery and selective pulmonary arteriography performed of the right pulmonary artery is well as a lower lobe branch. An exchange length guidewire was then advanced into the right lower lobe pulmonary artery.  Through the second femoral venous sheath, a 5 French catheter was advanced into the IVC, right heart and main pulmonary artery. The catheter was further advanced into the left pulmonary artery over a guidewire and selective arteriography performed. A guidewire was advanced into the left lower lobe pulmonary artery.  A 6 French EKOS infusion catheter was advanced over the right pulmonary arterial guidewire and into the right lower lobe pulmonary artery. An 18 cm distal infusion length catheter was chosen for placement. The coaxial ultrasound assisted infusion fiber was then advanced through the outer catheter.  A second 6 French EKOS infusion catheter was then advanced over the left pulmonary arterial guidewire into the left lower lobe pulmonary artery. A 12 cm distal infusion length was chosen for placement. The coaxial ultrasound fiber was then advanced through the outer catheter.  Femoral venous and arterial sheaths were connected to saline drips and secured at the skin with Ethilon retention sutures. Infusion catheters were connected to tPA infusion as well as the EKOS ultrasound infusion system. TPA infusion was begun at 1645 hr  via bilateral infusion catheters with a each catheter infusing at a rate of 1 milligram/hour. Infusion will be continued and monitored overnight for a total of 12 hr of infusion via each catheter system. At that time, infusion will be switched to saline and repeat pulmonary artery pressure measurements made tomorrow morning.  COMPLICATIONS: None  FINDINGS: During femoral venous catheterization, there  was inadvertent puncture of the femoral artery with advancement of a micropuncture dilator over a guidewire. This necessitated placing a 4 French sheath after arterial access in order to decrease risk of arterial bleeding during thrombolytic therapy.  After initial catheterization of the main pulmonary artery, pressure measurement demonstrates significant elevation of pulmonary artery pressures with measurement of 50/25 mm Hg with mean of 36 mm Hg. Bilateral pulmonary arteriography confirms the presence of significant clot burden extending into lower lobe arteries bilaterally. Infusion catheter systems were able to be well positioned with the right infusion catheter length spanning from the lower lobe pulmonary artery back into the main pulmonary artery. The left pulmonary infusion catheter spans from a lower lobe pulmonary artery back into the proximal left pulmonary artery.  IMPRESSION: Pulmonary artery pressure measurements demonstrate significant elevated pulmonary artery pressure measuring 50/25 mm Hg with mean of 36 mm Hg. Bilateral pulmonary arterial catheter directed ultrasound assistant thrombolytic therapy was begun via EKOS catheters at a rate of 1 mg of tPA per hr. Infusions will be continued bilaterally for a total of 12 hr. Repeat pulmonary artery pressure measurements and catheter removal will be performed tomorrow morning.   Electronically Signed   By: Aletta Edouard M.D.   On: 06/12/2013 12:07   Ir Infusion Thrombol Arterial Initial (ms)  06/12/2013   CLINICAL DATA:  Submassive bilateral acute pulmonary embolism with saddle component across the main pulmonary artery bifurcation. Evidence by CTA and echocardiography of associated right heart strain and pulmonary hypertension by echo. The patient has been evaluated and is a candidate for transcatheter thrombolytic infusion therapy.  EXAM: 1. ULTRASOUND GUIDANCE FOR VASCULAR ACCESS OF THE RIGHT COMMON FEMORAL VEIN X 2 2. BILATERAL PULMONARY  ARTERIOGRAPHY INCLUDING SELECTIVE CATHETERIZATION OF BOTH LOWER LOBE PULMONARY ARTERIES 3. TRANSCATHETER ULTRASOUND ASSISTED THROMBOLYTIC INFUSION THERAPY OF BILATERAL PULMONARY ARTERIES  COMPARISON:  CTA of the chest on 06/11/2013.  MEDICATIONS: Sedation:  1 mg IV Versed sec, 50 mcg IV fentanyl.  Total Moderate Sedation Time:  38 minutes.  CONTRAST:  2mL OMNIPAQUE IOHEXOL 300 MG/ML  SOLN  FLUOROSCOPY TIME:  12 min and 24 seconds.  PROCEDURE: Prior to the procedure, informed consent was obtained from the patient. Treatment alternatives were discussed in detail. Indications and risks for thrombolytics therapy were discussed including bleeding risk. The patient gave consent to proceed with pulmonary arteriography and planned bilateral thrombolytic infusion therapy.  Both groins were prepped with Betadine and sterilely prepped and draped. Maximal sterile barrier technique was utilized including caps, mask, sterile gowns, sterile gloves, sterile drape, hand hygiene and skin antiseptic.  Ultrasound was used to confirm patency of the right common femoral vein. Under direct ultrasound guidance, access was performed of the right common femoral vein utilizing a micropuncture set. A 6 French sheath was placed over a wire. Additional parallel access of the common femoral vein was performed with a second puncture under ultrasound guidance. A second 6 French sheath was then placed over a wire. During access, puncture of the right common femoral artery was inadvertently performed. A 4 French sheath was advanced into the right common femoral artery.  A 5 French catheter  was advanced via one of the venous sheaths into the inferior vena cava and further into the right atrium and right ventricle. The catheter was then advanced into the main pulmonary artery and initial pulmonary artery pressure readings made. Catheter access was then performed in the right pulmonary artery and selective pulmonary arteriography performed of the right  pulmonary artery is well as a lower lobe branch. An exchange length guidewire was then advanced into the right lower lobe pulmonary artery.  Through the second femoral venous sheath, a 5 French catheter was advanced into the IVC, right heart and main pulmonary artery. The catheter was further advanced into the left pulmonary artery over a guidewire and selective arteriography performed. A guidewire was advanced into the left lower lobe pulmonary artery.  A 6 French EKOS infusion catheter was advanced over the right pulmonary arterial guidewire and into the right lower lobe pulmonary artery. An 18 cm distal infusion length catheter was chosen for placement. The coaxial ultrasound assisted infusion fiber was then advanced through the outer catheter.  A second 6 French EKOS infusion catheter was then advanced over the left pulmonary arterial guidewire into the left lower lobe pulmonary artery. A 12 cm distal infusion length was chosen for placement. The coaxial ultrasound fiber was then advanced through the outer catheter.  Femoral venous and arterial sheaths were connected to saline drips and secured at the skin with Ethilon retention sutures. Infusion catheters were connected to tPA infusion as well as the EKOS ultrasound infusion system. TPA infusion was begun at 1645 hr via bilateral infusion catheters with a each catheter infusing at a rate of 1 milligram/hour. Infusion will be continued and monitored overnight for a total of 12 hr of infusion via each catheter system. At that time, infusion will be switched to saline and repeat pulmonary artery pressure measurements made tomorrow morning.  COMPLICATIONS: None  FINDINGS: During femoral venous catheterization, there was inadvertent puncture of the femoral artery with advancement of a micropuncture dilator over a guidewire. This necessitated placing a 4 French sheath after arterial access in order to decrease risk of arterial bleeding during thrombolytic therapy.   After initial catheterization of the main pulmonary artery, pressure measurement demonstrates significant elevation of pulmonary artery pressures with measurement of 50/25 mm Hg with mean of 36 mm Hg. Bilateral pulmonary arteriography confirms the presence of significant clot burden extending into lower lobe arteries bilaterally. Infusion catheter systems were able to be well positioned with the right infusion catheter length spanning from the lower lobe pulmonary artery back into the main pulmonary artery. The left pulmonary infusion catheter spans from a lower lobe pulmonary artery back into the proximal left pulmonary artery.  IMPRESSION: Pulmonary artery pressure measurements demonstrate significant elevated pulmonary artery pressure measuring 50/25 mm Hg with mean of 36 mm Hg. Bilateral pulmonary arterial catheter directed ultrasound assistant thrombolytic therapy was begun via EKOS catheters at a rate of 1 mg of tPA per hr. Infusions will be continued bilaterally for a total of 12 hr. Repeat pulmonary artery pressure measurements and catheter removal will be performed tomorrow morning.   Electronically Signed   By: Aletta Edouard M.D.   On: 06/12/2013 12:07   Ir Jacolyn Reedy F/u Eval Art/ven Final Day (ms)  06/12/2013   CLINICAL DATA:  Sub massive pulmonary emboli, status post 12 hr of catheter directed bilateral pulmonary artery thrombolytic infusion.  EXAM: IR THROMB F/U EVAL ART/VEN FINAL DAY  MEDICATIONS: None  CONTRAST:  None  FLUOROSCOPY TIME:  6 seconds  PROCEDURE: The coaxial ultrasound wires were removed from bilateral pulmonary arterial infusion catheters. Fluoroscopic inspection performed. Bilateral pressure measurements obtained. The infusion catheters were removed. The right femoral sheaths were left in place to flush system.  Complications: Right groin hematoma noted since initial placement  FINDINGS: Bilateral pulmonary arterial catheters stable in position since the initial placement .  Pressure  measurements as follows:  Right pulmonary artery 36/20 (26) mmHg  Left pulmonary artery 34/21 (25) mmHg  IMPRESSION: 1. Significant improvement in systolic and mean pulmonary arterial pressures after catheter directed pulmonary artery thrombolytic infusion. 2. The infusion catheters were removed. Femoral sheaths will be removed after heparin has been held 4 hr.   Electronically Signed   By: Arne Cleveland M.D.   On: 06/12/2013 10:15      ASSESSMENT / PLAN: Acute hypoxic respiratory failure  Submassive PE with R heart strain  Unprovoked RLE DVT Troponin elevation due to acute PE Hx GERD/PUD S/p Lap-Band procedure  Plan:  Catheter removed 5/8  Watch in ICU for at least 24 hrs after fibrinolytic therapy completed (stopped 5/08 @ 0330) >> Tx out 5/9 1000 Bleeding precautions Hypercoag panel ordered 5/08( antithro3 69)(ddimer 13) Minimum of 6 months anticoagulation - discussed possible need for lifelong therapy Discussed options for long term anticoagulation - she prefers Xarelto depending on insurance coverage. Started 5/9 per pharm consult Advance diet   Transfer out of ICU 5/09 and back to [place] [facility] Pager [phone] till 3 pm If no answer page [phone] 06/13/2013, 10:11 AM  Reviewed above, and agree.  Chesley Mires, MD [facility] Pulmonary/Critical Care 06/13/2013, 9:30 PM Pager:  [phone] After 3pm call: [phone]

## 2013-06-13 NOTE — Progress Notes (Signed)
ANTICOAGULATION CONSULT NOTE - Follow Up Consult  Pharmacy Consult for heparin Indication: pulmonary embolus and DVT  Labs:  Recent Labs  06/11/13 0530 06/11/13 0543 06/11/13 1317 06/11/13 1800 06/11/13 2030 06/12/13 0024 06/12/13 0240 06/12/13 0330 06/12/13 1120 06/12/13 1940 06/13/13 0304  HGB 15.6*  --   --  14.2  --   --  13.3  --  12.6 13.5 12.3  HCT 46.0  --   --  42.4  --   --  39.6  --  39.5 41.4 38.1  PLT  --   --   --  136*  --   --  137*  --  159 191 158  APTT  --   --   --  82*  --   --   --   --   --   --   --   LABPROT  --   --  14.5  --   --   --   --   --   --   --   --   INR  --   --  1.15  --   --   --   --   --   --   --   --   HEPARINUNFRC  --   --   --   --  0.75*  --  0.52  --   --   --  <0.10*  CREATININE 0.70  --   --   --   --   --   --  0.47*  --   --  0.53  TROPONINI  --  0.44*  --  0.40*  --  <0.30  --   --   --   --   --     53 yo female admitted 06/11/2013  with diagnosis of PE and DVT. She received, EKOS, lovenox and heparin.  Pharmacy now consulted to dose xarelto.  PMH: gastric bypass, GIB, GERD  Anticoagulation: PE: EKOS for tPA 5/7- started ~1630-1700> stopped 5/8 @0330 ;Heparin held for catheter removal not resumed.  Will begin xarelto today  ID: no acute issues;   Cards: PVD, HLD; VSS; Lasix PO daily  Endo: DM, serum gluc < 150  GI: GERD- PO PPI; CLQ; prn Linzess resumed from PTA  Renal: SCr 0.47, est CrCl >136mL/min, lytes all WNL  PTA Medication Issues: addressed Best Practices: Xarelto  Plan:  Xarelto 15 mg PO BID x 21 days followed by 20 mg daily.  Rowe Robert Pharm.D., BCPS, AQ-Cardiology Clinical Pharmacist 06/13/2013 10:50 AM Pager: [phone] Phone: [phone]

## 2013-06-13 NOTE — Progress Notes (Signed)
Per shift report patient was not started on heparin per MD for plan to begin Rivaroxaban in the morning. i spoke with patient who asured me that was the plan, because she had indicated her interest to start Rivaroxaban but was not quiet sure about what was decided. However heparin order was still active. Spoke with pharmacy, they too were not quiet sure what the plan was. Spoke with Dr Deterding who told me, that the plan was to stay off heparin and dose patient on Rivaroxaban this Am.

## 2013-06-13 NOTE — Progress Notes (Signed)
Subjective: Patient is s/p catheter directed PE lysis for bilateral PE with right heart strain. She is on RA and no longer requiring oxygen. She states her resting shortness of breath is improved, she does c/o DOE still and intermittent mid sternal chest pain. She denies any right groin site pain, active bleeding or loss of sensation. She will be started on xarelto today.   Objective: Physical Exam: BP 111/66  Pulse 70  Temp(Src) 97.6 F (36.4 C) (Oral)  Resp 18  Ht 5\' 6"  (1.676 m)  Wt 276 lb (125.193 kg)  BMI 44.57 kg/m2  SpO2 97%  General: A&Ox3, NAD, sitting up in bed. Heart: RRR without M/G/R Lungs CTA bilaterally without w/r/r Abd: Soft, NT, ND, (+) BS Ext: Right groin site dressing C/D/I, outlined hematoma from 5/8 without extension, periphery softened with no active signs of bleeding or expansion of hematoma. DP intact bilaterally, ext warm to touch.   Labs: CBC  Recent Labs  06/12/13 1940 06/13/13 0304  WBC 14.4* 9.8  HGB 13.5 12.3  HCT 41.4 38.1  PLT 191 158   BMET  Recent Labs  06/12/13 0330 06/13/13 0304  NA 142 144  K 3.9 4.3  CL 107 107  CO2 25 26  GLUCOSE 129* 90  BUN 14 17  CREATININE 0.47* 0.53  CALCIUM 8.3* 8.2*   LFT No results found for this basename: PROT, ALBUMIN, AST, ALT, ALKPHOS, BILITOT, BILIDIR, IBILI, LIPASE,  in the last 72 hours PT/INR  Recent Labs  06/11/13 1317  LABPROT 14.5  INR 1.15     Studies/Results: Ir Angiogram Pulmonary Bilateral Selective  06/12/2013   CLINICAL DATA:  Submassive bilateral acute pulmonary embolism with saddle component across the main pulmonary artery bifurcation. Evidence by CTA and echocardiography of associated right heart strain and pulmonary hypertension by echo. The patient has been evaluated and is a candidate for transcatheter thrombolytic infusion therapy.  EXAM: 1. ULTRASOUND GUIDANCE FOR VASCULAR ACCESS OF THE RIGHT COMMON FEMORAL VEIN X 2 2. BILATERAL PULMONARY ARTERIOGRAPHY INCLUDING  SELECTIVE CATHETERIZATION OF BOTH LOWER LOBE PULMONARY ARTERIES 3. TRANSCATHETER ULTRASOUND ASSISTED THROMBOLYTIC INFUSION THERAPY OF BILATERAL PULMONARY ARTERIES  COMPARISON:  CTA of the chest on 06/11/2013.  MEDICATIONS: Sedation:  1 mg IV Versed sec, 50 mcg IV fentanyl.  Total Moderate Sedation Time:  38 minutes.  CONTRAST:  11mL OMNIPAQUE IOHEXOL 300 MG/ML  SOLN  FLUOROSCOPY TIME:  12 min and 24 seconds.  PROCEDURE: Prior to the procedure, informed consent was obtained from the patient. Treatment alternatives were discussed in detail. Indications and risks for thrombolytics therapy were discussed including bleeding risk. The patient gave consent to proceed with pulmonary arteriography and planned bilateral thrombolytic infusion therapy.  Both groins were prepped with Betadine and sterilely prepped and draped. Maximal sterile barrier technique was utilized including caps, mask, sterile gowns, sterile gloves, sterile drape, hand hygiene and skin antiseptic.  Ultrasound was used to confirm patency of the right common femoral vein. Under direct ultrasound guidance, access was performed of the right common femoral vein utilizing a micropuncture set. A 6 French sheath was placed over a wire. Additional parallel access of the common femoral vein was performed with a second puncture under ultrasound guidance. A second 6 French sheath was then placed over a wire. During access, puncture of the right common femoral artery was inadvertently performed. A 4 French sheath was advanced into the right common femoral artery.  A 5 French catheter was advanced via one of the venous sheaths into the inferior vena  cava and further into the right atrium and right ventricle. The catheter was then advanced into the main pulmonary artery and initial pulmonary artery pressure readings made. Catheter access was then performed in the right pulmonary artery and selective pulmonary arteriography performed of the right pulmonary artery is  well as a lower lobe branch. An exchange length guidewire was then advanced into the right lower lobe pulmonary artery.  Through the second femoral venous sheath, a 5 French catheter was advanced into the IVC, right heart and main pulmonary artery. The catheter was further advanced into the left pulmonary artery over a guidewire and selective arteriography performed. A guidewire was advanced into the left lower lobe pulmonary artery.  A 6 French EKOS infusion catheter was advanced over the right pulmonary arterial guidewire and into the right lower lobe pulmonary artery. An 18 cm distal infusion length catheter was chosen for placement. The coaxial ultrasound assisted infusion fiber was then advanced through the outer catheter.  A second 6 French EKOS infusion catheter was then advanced over the left pulmonary arterial guidewire into the left lower lobe pulmonary artery. A 12 cm distal infusion length was chosen for placement. The coaxial ultrasound fiber was then advanced through the outer catheter.  Femoral venous and arterial sheaths were connected to saline drips and secured at the skin with Ethilon retention sutures. Infusion catheters were connected to tPA infusion as well as the EKOS ultrasound infusion system. TPA infusion was begun at 1645 hr via bilateral infusion catheters with a each catheter infusing at a rate of 1 milligram/hour. Infusion will be continued and monitored overnight for a total of 12 hr of infusion via each catheter system. At that time, infusion will be switched to saline and repeat pulmonary artery pressure measurements made tomorrow morning.  COMPLICATIONS: None  FINDINGS: During femoral venous catheterization, there was inadvertent puncture of the femoral artery with advancement of a micropuncture dilator over a guidewire. This necessitated placing a 4 French sheath after arterial access in order to decrease risk of arterial bleeding during thrombolytic therapy.  After initial  catheterization of the main pulmonary artery, pressure measurement demonstrates significant elevation of pulmonary artery pressures with measurement of 50/25 mm Hg with mean of 36 mm Hg. Bilateral pulmonary arteriography confirms the presence of significant clot burden extending into lower lobe arteries bilaterally. Infusion catheter systems were able to be well positioned with the right infusion catheter length spanning from the lower lobe pulmonary artery back into the main pulmonary artery. The left pulmonary infusion catheter spans from a lower lobe pulmonary artery back into the proximal left pulmonary artery.  IMPRESSION: Pulmonary artery pressure measurements demonstrate significant elevated pulmonary artery pressure measuring 50/25 mm Hg with mean of 36 mm Hg. Bilateral pulmonary arterial catheter directed ultrasound assistant thrombolytic therapy was begun via EKOS catheters at a rate of 1 mg of tPA per hr. Infusions will be continued bilaterally for a total of 12 hr. Repeat pulmonary artery pressure measurements and catheter removal will be performed tomorrow morning.   Electronically Signed   By: Aletta Edouard M.D.   On: 06/12/2013 12:07   Ir Angiogram Selective Each Additional Vessel  06/12/2013   CLINICAL DATA:  Submassive bilateral acute pulmonary embolism with saddle component across the main pulmonary artery bifurcation. Evidence by CTA and echocardiography of associated right heart strain and pulmonary hypertension by echo. The patient has been evaluated and is a candidate for transcatheter thrombolytic infusion therapy.  EXAM: 1. ULTRASOUND GUIDANCE FOR VASCULAR ACCESS OF THE  RIGHT COMMON FEMORAL VEIN X 2 2. BILATERAL PULMONARY ARTERIOGRAPHY INCLUDING SELECTIVE CATHETERIZATION OF BOTH LOWER LOBE PULMONARY ARTERIES 3. TRANSCATHETER ULTRASOUND ASSISTED THROMBOLYTIC INFUSION THERAPY OF BILATERAL PULMONARY ARTERIES  COMPARISON:  CTA of the chest on 06/11/2013.  MEDICATIONS: Sedation:  1 mg IV  Versed sec, 50 mcg IV fentanyl.  Total Moderate Sedation Time:  38 minutes.  CONTRAST:  24mL OMNIPAQUE IOHEXOL 300 MG/ML  SOLN  FLUOROSCOPY TIME:  12 min and 24 seconds.  PROCEDURE: Prior to the procedure, informed consent was obtained from the patient. Treatment alternatives were discussed in detail. Indications and risks for thrombolytics therapy were discussed including bleeding risk. The patient gave consent to proceed with pulmonary arteriography and planned bilateral thrombolytic infusion therapy.  Both groins were prepped with Betadine and sterilely prepped and draped. Maximal sterile barrier technique was utilized including caps, mask, sterile gowns, sterile gloves, sterile drape, hand hygiene and skin antiseptic.  Ultrasound was used to confirm patency of the right common femoral vein. Under direct ultrasound guidance, access was performed of the right common femoral vein utilizing a micropuncture set. A 6 French sheath was placed over a wire. Additional parallel access of the common femoral vein was performed with a second puncture under ultrasound guidance. A second 6 French sheath was then placed over a wire. During access, puncture of the right common femoral artery was inadvertently performed. A 4 French sheath was advanced into the right common femoral artery.  A 5 French catheter was advanced via one of the venous sheaths into the inferior vena cava and further into the right atrium and right ventricle. The catheter was then advanced into the main pulmonary artery and initial pulmonary artery pressure readings made. Catheter access was then performed in the right pulmonary artery and selective pulmonary arteriography performed of the right pulmonary artery is well as a lower lobe branch. An exchange length guidewire was then advanced into the right lower lobe pulmonary artery.  Through the second femoral venous sheath, a 5 French catheter was advanced into the IVC, right heart and main pulmonary  artery. The catheter was further advanced into the left pulmonary artery over a guidewire and selective arteriography performed. A guidewire was advanced into the left lower lobe pulmonary artery.  A 6 French EKOS infusion catheter was advanced over the right pulmonary arterial guidewire and into the right lower lobe pulmonary artery. An 18 cm distal infusion length catheter was chosen for placement. The coaxial ultrasound assisted infusion fiber was then advanced through the outer catheter.  A second 6 French EKOS infusion catheter was then advanced over the left pulmonary arterial guidewire into the left lower lobe pulmonary artery. A 12 cm distal infusion length was chosen for placement. The coaxial ultrasound fiber was then advanced through the outer catheter.  Femoral venous and arterial sheaths were connected to saline drips and secured at the skin with Ethilon retention sutures. Infusion catheters were connected to tPA infusion as well as the EKOS ultrasound infusion system. TPA infusion was begun at 1645 hr via bilateral infusion catheters with a each catheter infusing at a rate of 1 milligram/hour. Infusion will be continued and monitored overnight for a total of 12 hr of infusion via each catheter system. At that time, infusion will be switched to saline and repeat pulmonary artery pressure measurements made tomorrow morning.  COMPLICATIONS: None  FINDINGS: During femoral venous catheterization, there was inadvertent puncture of the femoral artery with advancement of a micropuncture dilator over a guidewire. This necessitated placing a  4 French sheath after arterial access in order to decrease risk of arterial bleeding during thrombolytic therapy.  After initial catheterization of the main pulmonary artery, pressure measurement demonstrates significant elevation of pulmonary artery pressures with measurement of 50/25 mm Hg with mean of 36 mm Hg. Bilateral pulmonary arteriography confirms the presence of  significant clot burden extending into lower lobe arteries bilaterally. Infusion catheter systems were able to be well positioned with the right infusion catheter length spanning from the lower lobe pulmonary artery back into the main pulmonary artery. The left pulmonary infusion catheter spans from a lower lobe pulmonary artery back into the proximal left pulmonary artery.  IMPRESSION: Pulmonary artery pressure measurements demonstrate significant elevated pulmonary artery pressure measuring 50/25 mm Hg with mean of 36 mm Hg. Bilateral pulmonary arterial catheter directed ultrasound assistant thrombolytic therapy was begun via EKOS catheters at a rate of 1 mg of tPA per hr. Infusions will be continued bilaterally for a total of 12 hr. Repeat pulmonary artery pressure measurements and catheter removal will be performed tomorrow morning.   Electronically Signed   By: Aletta Edouard M.D.   On: 06/12/2013 12:07   Ir US Guide Vasc Access Right  06/12/2013   CLINICAL DATA:  Submassive bilateral acute pulmonary embolism with saddle component across the main pulmonary artery bifurcation. Evidence by CTA and echocardiography of associated right heart strain and pulmonary hypertension by echo. The patient has been evaluated and is a candidate for transcatheter thrombolytic infusion therapy.  EXAM: 1. ULTRASOUND GUIDANCE FOR VASCULAR ACCESS OF THE RIGHT COMMON FEMORAL VEIN X 2 2. BILATERAL PULMONARY ARTERIOGRAPHY INCLUDING SELECTIVE CATHETERIZATION OF BOTH LOWER LOBE PULMONARY ARTERIES 3. TRANSCATHETER ULTRASOUND ASSISTED THROMBOLYTIC INFUSION THERAPY OF BILATERAL PULMONARY ARTERIES  COMPARISON:  CTA of the chest on 06/11/2013.  MEDICATIONS: Sedation:  1 mg IV Versed sec, 50 mcg IV fentanyl.  Total Moderate Sedation Time:  38 minutes.  CONTRAST:  59mL OMNIPAQUE IOHEXOL 300 MG/ML  SOLN  FLUOROSCOPY TIME:  12 min and 24 seconds.  PROCEDURE: Prior to the procedure, informed consent was obtained from the patient. Treatment  alternatives were discussed in detail. Indications and risks for thrombolytics therapy were discussed including bleeding risk. The patient gave consent to proceed with pulmonary arteriography and planned bilateral thrombolytic infusion therapy.  Both groins were prepped with Betadine and sterilely prepped and draped. Maximal sterile barrier technique was utilized including caps, mask, sterile gowns, sterile gloves, sterile drape, hand hygiene and skin antiseptic.  Ultrasound was used to confirm patency of the right common femoral vein. Under direct ultrasound guidance, access was performed of the right common femoral vein utilizing a micropuncture set. A 6 French sheath was placed over a wire. Additional parallel access of the common femoral vein was performed with a second puncture under ultrasound guidance. A second 6 French sheath was then placed over a wire. During access, puncture of the right common femoral artery was inadvertently performed. A 4 French sheath was advanced into the right common femoral artery.  A 5 French catheter was advanced via one of the venous sheaths into the inferior vena cava and further into the right atrium and right ventricle. The catheter was then advanced into the main pulmonary artery and initial pulmonary artery pressure readings made. Catheter access was then performed in the right pulmonary artery and selective pulmonary arteriography performed of the right pulmonary artery is well as a lower lobe branch. An exchange length guidewire was then advanced into the right lower lobe pulmonary artery.  Through  the second femoral venous sheath, a 5 French catheter was advanced into the IVC, right heart and main pulmonary artery. The catheter was further advanced into the left pulmonary artery over a guidewire and selective arteriography performed. A guidewire was advanced into the left lower lobe pulmonary artery.  A 6 French EKOS infusion catheter was advanced over the right  pulmonary arterial guidewire and into the right lower lobe pulmonary artery. An 18 cm distal infusion length catheter was chosen for placement. The coaxial ultrasound assisted infusion fiber was then advanced through the outer catheter.  A second 6 French EKOS infusion catheter was then advanced over the left pulmonary arterial guidewire into the left lower lobe pulmonary artery. A 12 cm distal infusion length was chosen for placement. The coaxial ultrasound fiber was then advanced through the outer catheter.  Femoral venous and arterial sheaths were connected to saline drips and secured at the skin with Ethilon retention sutures. Infusion catheters were connected to tPA infusion as well as the EKOS ultrasound infusion system. TPA infusion was begun at 1645 hr via bilateral infusion catheters with a each catheter infusing at a rate of 1 milligram/hour. Infusion will be continued and monitored overnight for a total of 12 hr of infusion via each catheter system. At that time, infusion will be switched to saline and repeat pulmonary artery pressure measurements made tomorrow morning.  COMPLICATIONS: None  FINDINGS: During femoral venous catheterization, there was inadvertent puncture of the femoral artery with advancement of a micropuncture dilator over a guidewire. This necessitated placing a 4 French sheath after arterial access in order to decrease risk of arterial bleeding during thrombolytic therapy.  After initial catheterization of the main pulmonary artery, pressure measurement demonstrates significant elevation of pulmonary artery pressures with measurement of 50/25 mm Hg with mean of 36 mm Hg. Bilateral pulmonary arteriography confirms the presence of significant clot burden extending into lower lobe arteries bilaterally. Infusion catheter systems were able to be well positioned with the right infusion catheter length spanning from the lower lobe pulmonary artery back into the main pulmonary artery. The left  pulmonary infusion catheter spans from a lower lobe pulmonary artery back into the proximal left pulmonary artery.  IMPRESSION: Pulmonary artery pressure measurements demonstrate significant elevated pulmonary artery pressure measuring 50/25 mm Hg with mean of 36 mm Hg. Bilateral pulmonary arterial catheter directed ultrasound assistant thrombolytic therapy was begun via EKOS catheters at a rate of 1 mg of tPA per hr. Infusions will be continued bilaterally for a total of 12 hr. Repeat pulmonary artery pressure measurements and catheter removal will be performed tomorrow morning.   Electronically Signed   By: Aletta Edouard M.D.   On: 06/12/2013 12:07   Ir Infusion Thrombol Arterial Initial (ms)  06/12/2013   CLINICAL DATA:  Submassive bilateral acute pulmonary embolism with saddle component across the main pulmonary artery bifurcation. Evidence by CTA and echocardiography of associated right heart strain and pulmonary hypertension by echo. The patient has been evaluated and is a candidate for transcatheter thrombolytic infusion therapy.  EXAM: 1. ULTRASOUND GUIDANCE FOR VASCULAR ACCESS OF THE RIGHT COMMON FEMORAL VEIN X 2 2. BILATERAL PULMONARY ARTERIOGRAPHY INCLUDING SELECTIVE CATHETERIZATION OF BOTH LOWER LOBE PULMONARY ARTERIES 3. TRANSCATHETER ULTRASOUND ASSISTED THROMBOLYTIC INFUSION THERAPY OF BILATERAL PULMONARY ARTERIES  COMPARISON:  CTA of the chest on 06/11/2013.  MEDICATIONS: Sedation:  1 mg IV Versed sec, 50 mcg IV fentanyl.  Total Moderate Sedation Time:  38 minutes.  CONTRAST:  80mL OMNIPAQUE IOHEXOL 300 MG/ML  SOLN  FLUOROSCOPY TIME:  12 min and 24 seconds.  PROCEDURE: Prior to the procedure, informed consent was obtained from the patient. Treatment alternatives were discussed in detail. Indications and risks for thrombolytics therapy were discussed including bleeding risk. The patient gave consent to proceed with pulmonary arteriography and planned bilateral thrombolytic infusion therapy.  Both  groins were prepped with Betadine and sterilely prepped and draped. Maximal sterile barrier technique was utilized including caps, mask, sterile gowns, sterile gloves, sterile drape, hand hygiene and skin antiseptic.  Ultrasound was used to confirm patency of the right common femoral vein. Under direct ultrasound guidance, access was performed of the right common femoral vein utilizing a micropuncture set. A 6 French sheath was placed over a wire. Additional parallel access of the common femoral vein was performed with a second puncture under ultrasound guidance. A second 6 French sheath was then placed over a wire. During access, puncture of the right common femoral artery was inadvertently performed. A 4 French sheath was advanced into the right common femoral artery.  A 5 French catheter was advanced via one of the venous sheaths into the inferior vena cava and further into the right atrium and right ventricle. The catheter was then advanced into the main pulmonary artery and initial pulmonary artery pressure readings made. Catheter access was then performed in the right pulmonary artery and selective pulmonary arteriography performed of the right pulmonary artery is well as a lower lobe branch. An exchange length guidewire was then advanced into the right lower lobe pulmonary artery.  Through the second femoral venous sheath, a 5 French catheter was advanced into the IVC, right heart and main pulmonary artery. The catheter was further advanced into the left pulmonary artery over a guidewire and selective arteriography performed. A guidewire was advanced into the left lower lobe pulmonary artery.  A 6 French EKOS infusion catheter was advanced over the right pulmonary arterial guidewire and into the right lower lobe pulmonary artery. An 18 cm distal infusion length catheter was chosen for placement. The coaxial ultrasound assisted infusion fiber was then advanced through the outer catheter.  A second 6 French  EKOS infusion catheter was then advanced over the left pulmonary arterial guidewire into the left lower lobe pulmonary artery. A 12 cm distal infusion length was chosen for placement. The coaxial ultrasound fiber was then advanced through the outer catheter.  Femoral venous and arterial sheaths were connected to saline drips and secured at the skin with Ethilon retention sutures. Infusion catheters were connected to tPA infusion as well as the EKOS ultrasound infusion system. TPA infusion was begun at 1645 hr via bilateral infusion catheters with a each catheter infusing at a rate of 1 milligram/hour. Infusion will be continued and monitored overnight for a total of 12 hr of infusion via each catheter system. At that time, infusion will be switched to saline and repeat pulmonary artery pressure measurements made tomorrow morning.  COMPLICATIONS: None  FINDINGS: During femoral venous catheterization, there was inadvertent puncture of the femoral artery with advancement of a micropuncture dilator over a guidewire. This necessitated placing a 4 French sheath after arterial access in order to decrease risk of arterial bleeding during thrombolytic therapy.  After initial catheterization of the main pulmonary artery, pressure measurement demonstrates significant elevation of pulmonary artery pressures with measurement of 50/25 mm Hg with mean of 36 mm Hg. Bilateral pulmonary arteriography confirms the presence of significant clot burden extending into lower lobe arteries bilaterally. Infusion catheter systems were able to be well  positioned with the right infusion catheter length spanning from the lower lobe pulmonary artery back into the main pulmonary artery. The left pulmonary infusion catheter spans from a lower lobe pulmonary artery back into the proximal left pulmonary artery.  IMPRESSION: Pulmonary artery pressure measurements demonstrate significant elevated pulmonary artery pressure measuring 50/25 mm Hg with  mean of 36 mm Hg. Bilateral pulmonary arterial catheter directed ultrasound assistant thrombolytic therapy was begun via EKOS catheters at a rate of 1 mg of tPA per hr. Infusions will be continued bilaterally for a total of 12 hr. Repeat pulmonary artery pressure measurements and catheter removal will be performed tomorrow morning.   Electronically Signed   By: Aletta Edouard M.D.   On: 06/12/2013 12:07   Ir Jacolyn Reedy F/u Eval Art/ven Final Day (ms)  06/12/2013   CLINICAL DATA:  Sub massive pulmonary emboli, status post 12 hr of catheter directed bilateral pulmonary artery thrombolytic infusion.  EXAM: IR THROMB F/U EVAL ART/VEN FINAL DAY  MEDICATIONS: None  CONTRAST:  None  FLUOROSCOPY TIME:  6 seconds  PROCEDURE: The coaxial ultrasound wires were removed from bilateral pulmonary arterial infusion catheters. Fluoroscopic inspection performed. Bilateral pressure measurements obtained. The infusion catheters were removed. The right femoral sheaths were left in place to flush system.  Complications: Right groin hematoma noted since initial placement  FINDINGS: Bilateral pulmonary arterial catheters stable in position since the initial placement .  Pressure measurements as follows:  Right pulmonary artery 36/20 (26) mmHg  Left pulmonary artery 34/21 (25) mmHg  IMPRESSION: 1. Significant improvement in systolic and mean pulmonary arterial pressures after catheter directed pulmonary artery thrombolytic infusion. 2. The infusion catheters were removed. Femoral sheaths will be removed after heparin has been held 4 hr.   Electronically Signed   By: Arne Cleveland M.D.   On: 06/12/2013 10:15    Assessment/Plan: Shortness of breath, resting improved.  Syncopal episode.  Bilateral pulmonary emboli with right heart strain, CTA and echo 06/11/13  RLE DVT  Chest pain, elevated troponin in setting of bilateral PE, persistent intermittent.   S/p image guided PE catheter directed lysis, symptoms improved, no active signs of  bleeding, labs stable. Patient to be started on xarelto today. Iodinated contrast allergy, pre-medication ordered for repeat CTA pulmonary tomorrow at 0800 to evaluate RV/LV ratio IR will follow. Patient should follow up with Dr. Kathlene Cote after discharge in 3-4 weeks.    LOS: 2 days    Sherri Jacob PA-C 06/13/2013 11:52 AM

## 2013-06-14 ENCOUNTER — Inpatient Hospital Stay (HOSPITAL_COMMUNITY): Payer: 59

## 2013-06-14 ENCOUNTER — Encounter (HOSPITAL_COMMUNITY): Payer: Self-pay | Admitting: Radiology

## 2013-06-14 ENCOUNTER — Other Ambulatory Visit: Payer: Self-pay | Admitting: Radiology

## 2013-06-14 DIAGNOSIS — I50810 Right heart failure, unspecified: Secondary | ICD-10-CM

## 2013-06-14 DIAGNOSIS — I2699 Other pulmonary embolism without acute cor pulmonale: Secondary | ICD-10-CM

## 2013-06-14 LAB — CBC
HCT: 40.4 % (ref 36.0–46.0)
Hemoglobin: 12.9 g/dL (ref 12.0–15.0)
MCH: 28.6 pg (ref 26.0–34.0)
MCHC: 31.9 g/dL (ref 30.0–36.0)
MCV: 89.6 fL (ref 78.0–100.0)
Platelets: 177 10*3/uL (ref 150–400)
RBC: 4.51 MIL/uL (ref 3.87–5.11)
RDW: 13.8 % (ref 11.5–15.5)
WBC: 6.3 10*3/uL (ref 4.0–10.5)

## 2013-06-14 LAB — BASIC METABOLIC PANEL
BUN: 13 mg/dL (ref 6–23)
CALCIUM: 9 mg/dL (ref 8.4–10.5)
CO2: 25 meq/L (ref 19–32)
Chloride: 105 mEq/L (ref 96–112)
Creatinine, Ser: 0.56 mg/dL (ref 0.50–1.10)
GFR calc Af Amer: >90 mL/min (ref >90)
GFR calc non Af Amer: >90 mL/min (ref >90)
Glucose, Bld: 121 mg/dL — ABNORMAL HIGH (ref 70–99)
POTASSIUM: 4.5 meq/L (ref 3.7–5.3)
SODIUM: 141 meq/L (ref 137–147)

## 2013-06-14 MED ORDER — IOHEXOL 350 MG/ML SOLN
70.0000 mL | Freq: Once | INTRAVENOUS | Status: AC | PRN
  Administered 2013-06-14: 70 mL via INTRAVENOUS

## 2013-06-14 NOTE — Discharge Instructions (Signed)
Information on my medicine - XARELTO (rivaroxaban)  This medication education was reviewed with me or my healthcare representative as part of my discharge preparation.  The pharmacist that spoke with me during my hospital stay was:  Tad Moore, [place]? Xarelto was prescribed to treat blood clots that may have been found in the veins of your legs (deep vein thrombosis) or in your lungs (pulmonary embolism) and to reduce the risk of them occurring again.  What do you need to know about Xarelto? The starting dose is one 15 mg tablet taken TWICE daily with food for the FIRST 21 DAYS then  the dose is changed to one 20 mg tablet taken ONCE A DAY with your evening meal.  DO NOT stop taking Xarelto without talking to the health care provider who prescribed the medication.  Refill your prescription for 20 mg tablets before you run out.  After discharge, you should have regular check-up appointments with your healthcare provider that is prescribing your Xarelto.  In the future your dose may need to be changed if your kidney function changes by a significant amount.  What do you do if you miss a dose? If you are taking Xarelto TWICE DAILY and you miss a dose, take it as soon as you remember. You may take two 15 mg tablets (total 30 mg) at the same time then resume your regularly scheduled 15 mg twice daily the next day.  If you are taking Xarelto ONCE DAILY and you miss a dose, take it as soon as you remember on the same day then continue your regularly scheduled once daily regimen the next day. Do not take two doses of Xarelto at the same time.   Important Safety Information Xarelto is a blood thinner medicine that can cause bleeding. You should call your healthcare provider right away if you experience any of the following:   Bleeding from an injury or your nose that does not stop.   Unusual colored urine (red or dark brown) or unusual colored stools (red  or black).   Unusual bruising for unknown reasons.   A serious fall or if you hit your head (even if there is no bleeding).  Some medicines may interact with Xarelto and might increase your risk of bleeding while on Xarelto. To help avoid this, consult your healthcare provider or pharmacist prior to using any new prescription or non-prescription medications, including herbals, vitamins, non-steroidal anti-inflammatory drugs (NSAIDs) and supplements.  This website has more information on Xarelto: [email or web address]

## 2013-06-14 NOTE — Progress Notes (Addendum)
PATIENT DETAILS Name: [name] Age: 53 y.o. Sex: female Date of Birth: October 26, 1960 Admit Date: 06/11/2013 Admitting Physician Phillips Grout, MD [id number], MD  Subjective: Denies major complaints.  Assessment/Plan: Principal Problem: Acute hypoxic acute failure - Secondary to large pulmonary embolism - Better after treatment with catheter directed Lysis and anticoagulation -attempt to wean of oxygen and ambulate as tolerated  Active Problems: Venous thromboembolism- acute sub-massive PE with DVT- with right ventricular strain - Presented with chest pain and shortness of breath, further evaluation with a 2-D echocardiogram right ventricular strain, PCCM was consulted, recommendations were to proceed with ultrasound assisted transcatheter lytic therapy of bilateral pulmonary embolism. This was done on 5/7, patient was subsequently monitored in the intensive care unit. Patient improved significantly, and subsequently started on Xarelto 5/9, and transferred to the hospitalist service on 5/10. Repeat CT angiogram Chest done on 5/10 shows significantly decreased clot burden. - Hypercoagulable panel pending, instructed patient to continue with anticoagulation for the time being and followup with hematology in the outpatient setting. Etiology/his factors of VTE unknown at this time.  GERD -stable-c/w PPI  Diabetes -diet controlled.Monitor sugards periodically  History  Varicose veins of lower extremities with other complications -ted stocking  Disposition: Remain inpatient  DVT Prophylaxis: Not needed as on Xarelto  Code Status: Full code   Family Communication None at bedside  Procedures: ultrasound assisted transcatheter lytic therapy of bilateral pulmonary embolism.  CONSULTS:  pulmonary/intensive care  MEDICATIONS: Scheduled Meds: . aspirin  324 mg Oral Once  . furosemide  20 mg Oral Daily  . pantoprazole  40 mg Oral Q1200  . Rivaroxaban  15 mg  Oral BID WC  . sodium chloride  3 mL Intravenous Q12H  . sodium chloride  3 mL Intravenous Q12H   Continuous Infusions:  PRN Meds:.sodium chloride, alum & mag hydroxide-simeth, diazepam, diphenhydrAMINE, guaiFENesin-dextromethorphan, HYDROcodone-acetaminophen, Linaclotide, ondansetron (ZOFRAN) IV, polyethylene glycol, sodium chloride  Antibiotics: Anti-infectives   None       PHYSICAL EXAM: Vital signs in last 24 hours: Filed Vitals:   06/13/13 1150 06/13/13 1501 06/13/13 2107 06/14/13 0601  BP: 111/66 132/83 115/71 118/73  Pulse: 70 71 79 72  Temp: 97.6 F (36.4 C) 97.3 F (36.3 C) 98.5 F (36.9 C) 97.7 F (36.5 C)  TempSrc: Oral Oral Oral Oral  Resp: 18 18 18 18   Height:      Weight:      SpO2: 97% 96% 98% 92%    Weight change:  Filed Weights   06/11/13 0523  Weight: 125.193 kg (276 lb)   Body mass index is 44.57 kg/(m^2).   Gen Exam: Awake and alert with clear speech.   Neck: Supple, No JVD.   Chest: B/L Clear.   CVS: S1 S2 Regular, no murmurs.  Abdomen: soft, BS +, non tender, non distended.  Extremities: no edema, lower extremities warm to touch. Neurologic: Non Focal.   Skin: No Rash.   Wounds: N/A.    Intake/Output from previous day:  Intake/Output Summary (Last 24 hours) at 06/14/13 1033 Last data filed at 06/13/13 2104  Gross per 24 hour  Intake    240 ml  Output      0 ml  Net    240 ml     LAB RESULTS: CBC  Recent Labs Lab 06/12/13 0240 06/12/13 1120 06/12/13 1940 06/13/13 0304 06/14/13 0525  WBC 5.4 9.7 14.4* 9.8 6.3  HGB 13.3 12.6 13.5 12.3 12.9  HCT 39.6  39.5 41.4 38.1 40.4  PLT 137* 159 191 158 177  MCV 87.8 88.8 88.7 89.0 89.6  MCH 29.5 28.3 28.9 28.7 28.6  MCHC 33.6 31.9 32.6 32.3 31.9  RDW 13.5 13.4 13.7 13.6 13.8    Chemistries   Recent Labs Lab 06/11/13 0530 06/12/13 0330 06/13/13 0304 06/14/13 0525  NA 141 142 144 141  K 4.0 3.9 4.3 4.5  CL 99 107 107 105  CO2  --  25 26 25   GLUCOSE 107* 129* 90 121*    BUN 12 14 17 13   CREATININE 0.70 0.47* 0.53 0.56  CALCIUM  --  8.3* 8.2* 9.0    CBG: No results found for this basename: GLUCAP,  in the last 168 hours  GFR Estimated Creatinine Clearance: 108.8 ml/min (by C-G formula based on Cr of 0.56).  Coagulation profile  Recent Labs Lab 06/11/13 1317  INR 1.15    Cardiac Enzymes  Recent Labs Lab 06/11/13 0543 06/11/13 1800 06/12/13 0024  TROPONINI 0.44* 0.40* <0.30    No components found with this basename: POCBNP,  No results found for this basename: DDIMER,  in the last 72 hours No results found for this basename: HGBA1C,  in the last 72 hours No results found for this basename: CHOL, HDL, LDLCALC, TRIG, CHOLHDL, LDLDIRECT,  in the last 72 hours No results found for this basename: TSH, T4TOTAL, FREET3, T3FREE, THYROIDAB,  in the last 72 hours No results found for this basename: VITAMINB12, FOLATE, FERRITIN, TIBC, IRON, RETICCTPCT,  in the last 72 hours No results found for this basename: LIPASE, AMYLASE,  in the last 72 hours  Urine Studies No results found for this basename: UACOL, UAPR, USPG, UPH, UTP, UGL, UKET, UBIL, UHGB, UNIT, UROB, ULEU, UEPI, UWBC, URBC, UBAC, CAST, CRYS, UCOM, BILUA,  in the last 72 hours  MICROBIOLOGY: Recent Results (from the past 240 hour(s))  MRSA PCR SCREENING     Status: None   Collection Time    06/11/13  1:57 PM      Result Value Ref Range Status   MRSA by PCR NEGATIVE  NEGATIVE Final   Comment:            The GeneXpert MRSA Assay (FDA     approved for NASAL specimens     only), is one component of a     comprehensive MRSA colonization     surveillance program. It is not     intended to diagnose MRSA     infection nor to guide or     monitor treatment for     MRSA infections.    RADIOLOGY STUDIES/RESULTS: Ct Angio Chest Pe W/cm &/or Wo Cm  06/14/2013   CLINICAL DATA:  Acute pulmonary embolism with right heart strain. Status post catheter like cysts of emboli. Followup pulmonary  embolism burden and right heart strain.  EXAM: CT ANGIOGRAPHY CHEST WITH CONTRAST  TECHNIQUE: Multidetector CT imaging of the chest was performed using the standard protocol during bolus administration of intravenous contrast. Multiplanar CT image reconstructions and MIPs were obtained to evaluate the vascular anatomy.  CONTRAST:  41mL OMNIPAQUE IOHEXOL 350 MG/ML SOLN  COMPARISON:  06/11/2013  FINDINGS: There has been significant decrease in size and extent of bilateral pulmonary emboli compared to prior exam. Previously seen saddle embolus is no longer visualized.  Calculated RV/OV ratio is now 0.92, which is decreased from 1.15 on previous exam and is consistent with decreased right heart strain.  Small areas of peripheral pulmonary opacity in the  lower lobes show no significant change are suspicious for areas of pulmonary infarction. No new or worsening areas of pulmonary infiltrate or seen. No evidence of mass or lymphadenopathy. No evidence of pleural or pericardial effusion.  Review of the MIP images confirms the above findings.  IMPRESSION: Significant decrease in bilateral pulmonary embolism burden, as well as decreased RV/LV ratio consistent with decrease in right heart strain.  Persistent small areas of peripheral pulmonary airspace disease in both lower lobes, suspicious for pulmonary infarcts.   Electronically Signed   By: Earle Gell M.D.   On: 06/14/2013 09:09   Ct Angio Chest Pe W/cm &/or Wo Cm  06/11/2013   CLINICAL DATA:  53 year old female with chest pain and shortness of Breath. Initial encounter.  EXAM: CT ANGIOGRAPHY CHEST WITH CONTRAST  TECHNIQUE: Multidetector CT imaging of the chest was performed using the standard protocol during bolus administration of intravenous contrast. Multiplanar CT image reconstructions and MIPs were obtained to evaluate the vascular anatomy.  CONTRAST:  60mL OMNIPAQUE IOHEXOL 350 MG/ML SOLN  COMPARISON:  Chest CTA 11/09/2009.  FINDINGS: Good contrast bolus timing  in the pulmonary arterial tree. Saddle embolus with bilateral central pulmonary artery filling defects. Bilateral upper and lower lobe clot propagating into the segmental and subsegmental arteries.  Right ventricle to left ventricle ratio calculated at 1.15.  Major airways are patent. Scattered bilateral peripheral sometimes wedge-shaped ground-glass and more confluent pulmonary opacity. Superimposed dependent atelectasis.  No pericardial effusion. Up to only trace bilateral pleural effusions.  No acute osseous abnormality identified.  Negative visualized upper abdominal viscera except for sequelae of gastric banding.  Negative visualized aorta. No mediastinal lymphadenopathy. No axillary lymphadenopathy.  Review of the MIP images confirms the above findings.  IMPRESSION: 1. Positive for acute PE with CTevidence of right heart strain (RV/LV Ratio = 1.2) consistent with at least submassive (intermediate risk) PE. The presence of right heart strain has been associated with an increased risk of morbidity and mortality. 2. Scattered small bilateral peripheral pulmonary infarcts. Superimposed dependent atelectasis. Only trace pleural fluid.  Critical Value/emergent results were called by telephone at the time of interpretation on 06/11/2013 at 35 AM to Dr. Regenia Skeeter in the ER who verbally acknowledged these results.   Electronically Signed   By: Lars Pinks M.D.   On: 06/11/2013 11:12   Ir Angiogram Pulmonary Bilateral Selective  06/12/2013   CLINICAL DATA:  Submassive bilateral acute pulmonary embolism with saddle component across the main pulmonary artery bifurcation. Evidence by CTA and echocardiography of associated right heart strain and pulmonary hypertension by echo. The patient has been evaluated and is a candidate for transcatheter thrombolytic infusion therapy.  EXAM: 1. ULTRASOUND GUIDANCE FOR VASCULAR ACCESS OF THE RIGHT COMMON FEMORAL VEIN X 2 2. BILATERAL PULMONARY ARTERIOGRAPHY INCLUDING SELECTIVE  CATHETERIZATION OF BOTH LOWER LOBE PULMONARY ARTERIES 3. TRANSCATHETER ULTRASOUND ASSISTED THROMBOLYTIC INFUSION THERAPY OF BILATERAL PULMONARY ARTERIES  COMPARISON:  CTA of the chest on 06/11/2013.  MEDICATIONS: Sedation:  1 mg IV Versed sec, 50 mcg IV fentanyl.  Total Moderate Sedation Time:  38 minutes.  CONTRAST:  69mL OMNIPAQUE IOHEXOL 300 MG/ML  SOLN  FLUOROSCOPY TIME:  12 min and 24 seconds.  PROCEDURE: Prior to the procedure, informed consent was obtained from the patient. Treatment alternatives were discussed in detail. Indications and risks for thrombolytics therapy were discussed including bleeding risk. The patient gave consent to proceed with pulmonary arteriography and planned bilateral thrombolytic infusion therapy.  Both groins were prepped with Betadine and sterilely prepped and  draped. Maximal sterile barrier technique was utilized including caps, mask, sterile gowns, sterile gloves, sterile drape, hand hygiene and skin antiseptic.  Ultrasound was used to confirm patency of the right common femoral vein. Under direct ultrasound guidance, access was performed of the right common femoral vein utilizing a micropuncture set. A 6 French sheath was placed over a wire. Additional parallel access of the common femoral vein was performed with a second puncture under ultrasound guidance. A second 6 French sheath was then placed over a wire. During access, puncture of the right common femoral artery was inadvertently performed. A 4 French sheath was advanced into the right common femoral artery.  A 5 French catheter was advanced via one of the venous sheaths into the inferior vena cava and further into the right atrium and right ventricle. The catheter was then advanced into the main pulmonary artery and initial pulmonary artery pressure readings made. Catheter access was then performed in the right pulmonary artery and selective pulmonary arteriography performed of the right pulmonary artery is well as a  lower lobe branch. An exchange length guidewire was then advanced into the right lower lobe pulmonary artery.  Through the second femoral venous sheath, a 5 French catheter was advanced into the IVC, right heart and main pulmonary artery. The catheter was further advanced into the left pulmonary artery over a guidewire and selective arteriography performed. A guidewire was advanced into the left lower lobe pulmonary artery.  A 6 French EKOS infusion catheter was advanced over the right pulmonary arterial guidewire and into the right lower lobe pulmonary artery. An 18 cm distal infusion length catheter was chosen for placement. The coaxial ultrasound assisted infusion fiber was then advanced through the outer catheter.  A second 6 French EKOS infusion catheter was then advanced over the left pulmonary arterial guidewire into the left lower lobe pulmonary artery. A 12 cm distal infusion length was chosen for placement. The coaxial ultrasound fiber was then advanced through the outer catheter.  Femoral venous and arterial sheaths were connected to saline drips and secured at the skin with Ethilon retention sutures. Infusion catheters were connected to tPA infusion as well as the EKOS ultrasound infusion system. TPA infusion was begun at 1645 hr via bilateral infusion catheters with a each catheter infusing at a rate of 1 milligram/hour. Infusion will be continued and monitored overnight for a total of 12 hr of infusion via each catheter system. At that time, infusion will be switched to saline and repeat pulmonary artery pressure measurements made tomorrow morning.  COMPLICATIONS: None  FINDINGS: During femoral venous catheterization, there was inadvertent puncture of the femoral artery with advancement of a micropuncture dilator over a guidewire. This necessitated placing a 4 French sheath after arterial access in order to decrease risk of arterial bleeding during thrombolytic therapy.  After initial catheterization  of the main pulmonary artery, pressure measurement demonstrates significant elevation of pulmonary artery pressures with measurement of 50/25 mm Hg with mean of 36 mm Hg. Bilateral pulmonary arteriography confirms the presence of significant clot burden extending into lower lobe arteries bilaterally. Infusion catheter systems were able to be well positioned with the right infusion catheter length spanning from the lower lobe pulmonary artery back into the main pulmonary artery. The left pulmonary infusion catheter spans from a lower lobe pulmonary artery back into the proximal left pulmonary artery.  IMPRESSION: Pulmonary artery pressure measurements demonstrate significant elevated pulmonary artery pressure measuring 50/25 mm Hg with mean of 36 mm Hg. Bilateral pulmonary arterial  catheter directed ultrasound assistant thrombolytic therapy was begun via EKOS catheters at a rate of 1 mg of tPA per hr. Infusions will be continued bilaterally for a total of 12 hr. Repeat pulmonary artery pressure measurements and catheter removal will be performed tomorrow morning.   Electronically Signed   By: Aletta Edouard M.D.   On: 06/12/2013 12:07   Ir Angiogram Selective Each Additional Vessel  06/12/2013   CLINICAL DATA:  Submassive bilateral acute pulmonary embolism with saddle component across the main pulmonary artery bifurcation. Evidence by CTA and echocardiography of associated right heart strain and pulmonary hypertension by echo. The patient has been evaluated and is a candidate for transcatheter thrombolytic infusion therapy.  EXAM: 1. ULTRASOUND GUIDANCE FOR VASCULAR ACCESS OF THE RIGHT COMMON FEMORAL VEIN X 2 2. BILATERAL PULMONARY ARTERIOGRAPHY INCLUDING SELECTIVE CATHETERIZATION OF BOTH LOWER LOBE PULMONARY ARTERIES 3. TRANSCATHETER ULTRASOUND ASSISTED THROMBOLYTIC INFUSION THERAPY OF BILATERAL PULMONARY ARTERIES  COMPARISON:  CTA of the chest on 06/11/2013.  MEDICATIONS: Sedation:  1 mg IV Versed sec, 50 mcg IV  fentanyl.  Total Moderate Sedation Time:  38 minutes.  CONTRAST:  83mL OMNIPAQUE IOHEXOL 300 MG/ML  SOLN  FLUOROSCOPY TIME:  12 min and 24 seconds.  PROCEDURE: Prior to the procedure, informed consent was obtained from the patient. Treatment alternatives were discussed in detail. Indications and risks for thrombolytics therapy were discussed including bleeding risk. The patient gave consent to proceed with pulmonary arteriography and planned bilateral thrombolytic infusion therapy.  Both groins were prepped with Betadine and sterilely prepped and draped. Maximal sterile barrier technique was utilized including caps, mask, sterile gowns, sterile gloves, sterile drape, hand hygiene and skin antiseptic.  Ultrasound was used to confirm patency of the right common femoral vein. Under direct ultrasound guidance, access was performed of the right common femoral vein utilizing a micropuncture set. A 6 French sheath was placed over a wire. Additional parallel access of the common femoral vein was performed with a second puncture under ultrasound guidance. A second 6 French sheath was then placed over a wire. During access, puncture of the right common femoral artery was inadvertently performed. A 4 French sheath was advanced into the right common femoral artery.  A 5 French catheter was advanced via one of the venous sheaths into the inferior vena cava and further into the right atrium and right ventricle. The catheter was then advanced into the main pulmonary artery and initial pulmonary artery pressure readings made. Catheter access was then performed in the right pulmonary artery and selective pulmonary arteriography performed of the right pulmonary artery is well as a lower lobe branch. An exchange length guidewire was then advanced into the right lower lobe pulmonary artery.  Through the second femoral venous sheath, a 5 French catheter was advanced into the IVC, right heart and main pulmonary artery. The catheter was  further advanced into the left pulmonary artery over a guidewire and selective arteriography performed. A guidewire was advanced into the left lower lobe pulmonary artery.  A 6 French EKOS infusion catheter was advanced over the right pulmonary arterial guidewire and into the right lower lobe pulmonary artery. An 18 cm distal infusion length catheter was chosen for placement. The coaxial ultrasound assisted infusion fiber was then advanced through the outer catheter.  A second 6 French EKOS infusion catheter was then advanced over the left pulmonary arterial guidewire into the left lower lobe pulmonary artery. A 12 cm distal infusion length was chosen for placement. The coaxial ultrasound fiber was then  advanced through the outer catheter.  Femoral venous and arterial sheaths were connected to saline drips and secured at the skin with Ethilon retention sutures. Infusion catheters were connected to tPA infusion as well as the EKOS ultrasound infusion system. TPA infusion was begun at 1645 hr via bilateral infusion catheters with a each catheter infusing at a rate of 1 milligram/hour. Infusion will be continued and monitored overnight for a total of 12 hr of infusion via each catheter system. At that time, infusion will be switched to saline and repeat pulmonary artery pressure measurements made tomorrow morning.  COMPLICATIONS: None  FINDINGS: During femoral venous catheterization, there was inadvertent puncture of the femoral artery with advancement of a micropuncture dilator over a guidewire. This necessitated placing a 4 French sheath after arterial access in order to decrease risk of arterial bleeding during thrombolytic therapy.  After initial catheterization of the main pulmonary artery, pressure measurement demonstrates significant elevation of pulmonary artery pressures with measurement of 50/25 mm Hg with mean of 36 mm Hg. Bilateral pulmonary arteriography confirms the presence of significant clot burden  extending into lower lobe arteries bilaterally. Infusion catheter systems were able to be well positioned with the right infusion catheter length spanning from the lower lobe pulmonary artery back into the main pulmonary artery. The left pulmonary infusion catheter spans from a lower lobe pulmonary artery back into the proximal left pulmonary artery.  IMPRESSION: Pulmonary artery pressure measurements demonstrate significant elevated pulmonary artery pressure measuring 50/25 mm Hg with mean of 36 mm Hg. Bilateral pulmonary arterial catheter directed ultrasound assistant thrombolytic therapy was begun via EKOS catheters at a rate of 1 mg of tPA per hr. Infusions will be continued bilaterally for a total of 12 hr. Repeat pulmonary artery pressure measurements and catheter removal will be performed tomorrow morning.   Electronically Signed   By: Aletta Edouard M.D.   On: 06/12/2013 12:07   Ir US Guide Vasc Access Right  06/12/2013   CLINICAL DATA:  Submassive bilateral acute pulmonary embolism with saddle component across the main pulmonary artery bifurcation. Evidence by CTA and echocardiography of associated right heart strain and pulmonary hypertension by echo. The patient has been evaluated and is a candidate for transcatheter thrombolytic infusion therapy.  EXAM: 1. ULTRASOUND GUIDANCE FOR VASCULAR ACCESS OF THE RIGHT COMMON FEMORAL VEIN X 2 2. BILATERAL PULMONARY ARTERIOGRAPHY INCLUDING SELECTIVE CATHETERIZATION OF BOTH LOWER LOBE PULMONARY ARTERIES 3. TRANSCATHETER ULTRASOUND ASSISTED THROMBOLYTIC INFUSION THERAPY OF BILATERAL PULMONARY ARTERIES  COMPARISON:  CTA of the chest on 06/11/2013.  MEDICATIONS: Sedation:  1 mg IV Versed sec, 50 mcg IV fentanyl.  Total Moderate Sedation Time:  38 minutes.  CONTRAST:  77mL OMNIPAQUE IOHEXOL 300 MG/ML  SOLN  FLUOROSCOPY TIME:  12 min and 24 seconds.  PROCEDURE: Prior to the procedure, informed consent was obtained from the patient. Treatment alternatives were discussed  in detail. Indications and risks for thrombolytics therapy were discussed including bleeding risk. The patient gave consent to proceed with pulmonary arteriography and planned bilateral thrombolytic infusion therapy.  Both groins were prepped with Betadine and sterilely prepped and draped. Maximal sterile barrier technique was utilized including caps, mask, sterile gowns, sterile gloves, sterile drape, hand hygiene and skin antiseptic.  Ultrasound was used to confirm patency of the right common femoral vein. Under direct ultrasound guidance, access was performed of the right common femoral vein utilizing a micropuncture set. A 6 French sheath was placed over a wire. Additional parallel access of the common femoral vein was performed  with a second puncture under ultrasound guidance. A second 6 French sheath was then placed over a wire. During access, puncture of the right common femoral artery was inadvertently performed. A 4 French sheath was advanced into the right common femoral artery.  A 5 French catheter was advanced via one of the venous sheaths into the inferior vena cava and further into the right atrium and right ventricle. The catheter was then advanced into the main pulmonary artery and initial pulmonary artery pressure readings made. Catheter access was then performed in the right pulmonary artery and selective pulmonary arteriography performed of the right pulmonary artery is well as a lower lobe branch. An exchange length guidewire was then advanced into the right lower lobe pulmonary artery.  Through the second femoral venous sheath, a 5 French catheter was advanced into the IVC, right heart and main pulmonary artery. The catheter was further advanced into the left pulmonary artery over a guidewire and selective arteriography performed. A guidewire was advanced into the left lower lobe pulmonary artery.  A 6 French EKOS infusion catheter was advanced over the right pulmonary arterial guidewire and  into the right lower lobe pulmonary artery. An 18 cm distal infusion length catheter was chosen for placement. The coaxial ultrasound assisted infusion fiber was then advanced through the outer catheter.  A second 6 French EKOS infusion catheter was then advanced over the left pulmonary arterial guidewire into the left lower lobe pulmonary artery. A 12 cm distal infusion length was chosen for placement. The coaxial ultrasound fiber was then advanced through the outer catheter.  Femoral venous and arterial sheaths were connected to saline drips and secured at the skin with Ethilon retention sutures. Infusion catheters were connected to tPA infusion as well as the EKOS ultrasound infusion system. TPA infusion was begun at 1645 hr via bilateral infusion catheters with a each catheter infusing at a rate of 1 milligram/hour. Infusion will be continued and monitored overnight for a total of 12 hr of infusion via each catheter system. At that time, infusion will be switched to saline and repeat pulmonary artery pressure measurements made tomorrow morning.  COMPLICATIONS: None  FINDINGS: During femoral venous catheterization, there was inadvertent puncture of the femoral artery with advancement of a micropuncture dilator over a guidewire. This necessitated placing a 4 French sheath after arterial access in order to decrease risk of arterial bleeding during thrombolytic therapy.  After initial catheterization of the main pulmonary artery, pressure measurement demonstrates significant elevation of pulmonary artery pressures with measurement of 50/25 mm Hg with mean of 36 mm Hg. Bilateral pulmonary arteriography confirms the presence of significant clot burden extending into lower lobe arteries bilaterally. Infusion catheter systems were able to be well positioned with the right infusion catheter length spanning from the lower lobe pulmonary artery back into the main pulmonary artery. The left pulmonary infusion catheter spans  from a lower lobe pulmonary artery back into the proximal left pulmonary artery.  IMPRESSION: Pulmonary artery pressure measurements demonstrate significant elevated pulmonary artery pressure measuring 50/25 mm Hg with mean of 36 mm Hg. Bilateral pulmonary arterial catheter directed ultrasound assistant thrombolytic therapy was begun via EKOS catheters at a rate of 1 mg of tPA per hr. Infusions will be continued bilaterally for a total of 12 hr. Repeat pulmonary artery pressure measurements and catheter removal will be performed tomorrow morning.   Electronically Signed   By: Irish Lack M.D.   On: 06/12/2013 12:07   Dg Chest Portable 1 View  06/11/2013  CLINICAL DATA:  Chest pain and shortness of breath  EXAM: PORTABLE CHEST - 1 VIEW  COMPARISON:  11/09/2009  FINDINGS: Borderline cardiomegaly, accentuated by technique. Unchanged upper mediastinal contours. Prominent markings at the bases without overt edema. No consolidation, effusion, or pneumothorax.  IMPRESSION: No active disease.   Electronically Signed   By: Jorje Guild M.D.   On: 06/11/2013 06:08   Ir Infusion Thrombol Arterial Initial (ms)  06/12/2013   CLINICAL DATA:  Submassive bilateral acute pulmonary embolism with saddle component across the main pulmonary artery bifurcation. Evidence by CTA and echocardiography of associated right heart strain and pulmonary hypertension by echo. The patient has been evaluated and is a candidate for transcatheter thrombolytic infusion therapy.  EXAM: 1. ULTRASOUND GUIDANCE FOR VASCULAR ACCESS OF THE RIGHT COMMON FEMORAL VEIN X 2 2. BILATERAL PULMONARY ARTERIOGRAPHY INCLUDING SELECTIVE CATHETERIZATION OF BOTH LOWER LOBE PULMONARY ARTERIES 3. TRANSCATHETER ULTRASOUND ASSISTED THROMBOLYTIC INFUSION THERAPY OF BILATERAL PULMONARY ARTERIES  COMPARISON:  CTA of the chest on 06/11/2013.  MEDICATIONS: Sedation:  1 mg IV Versed sec, 50 mcg IV fentanyl.  Total Moderate Sedation Time:  38 minutes.  CONTRAST:  58mL  OMNIPAQUE IOHEXOL 300 MG/ML  SOLN  FLUOROSCOPY TIME:  12 min and 24 seconds.  PROCEDURE: Prior to the procedure, informed consent was obtained from the patient. Treatment alternatives were discussed in detail. Indications and risks for thrombolytics therapy were discussed including bleeding risk. The patient gave consent to proceed with pulmonary arteriography and planned bilateral thrombolytic infusion therapy.  Both groins were prepped with Betadine and sterilely prepped and draped. Maximal sterile barrier technique was utilized including caps, mask, sterile gowns, sterile gloves, sterile drape, hand hygiene and skin antiseptic.  Ultrasound was used to confirm patency of the right common femoral vein. Under direct ultrasound guidance, access was performed of the right common femoral vein utilizing a micropuncture set. A 6 French sheath was placed over a wire. Additional parallel access of the common femoral vein was performed with a second puncture under ultrasound guidance. A second 6 French sheath was then placed over a wire. During access, puncture of the right common femoral artery was inadvertently performed. A 4 French sheath was advanced into the right common femoral artery.  A 5 French catheter was advanced via one of the venous sheaths into the inferior vena cava and further into the right atrium and right ventricle. The catheter was then advanced into the main pulmonary artery and initial pulmonary artery pressure readings made. Catheter access was then performed in the right pulmonary artery and selective pulmonary arteriography performed of the right pulmonary artery is well as a lower lobe branch. An exchange length guidewire was then advanced into the right lower lobe pulmonary artery.  Through the second femoral venous sheath, a 5 French catheter was advanced into the IVC, right heart and main pulmonary artery. The catheter was further advanced into the left pulmonary artery over a guidewire and  selective arteriography performed. A guidewire was advanced into the left lower lobe pulmonary artery.  A 6 French EKOS infusion catheter was advanced over the right pulmonary arterial guidewire and into the right lower lobe pulmonary artery. An 18 cm distal infusion length catheter was chosen for placement. The coaxial ultrasound assisted infusion fiber was then advanced through the outer catheter.  A second 6 French EKOS infusion catheter was then advanced over the left pulmonary arterial guidewire into the left lower lobe pulmonary artery. A 12 cm distal infusion length was chosen for placement. The coaxial  ultrasound fiber was then advanced through the outer catheter.  Femoral venous and arterial sheaths were connected to saline drips and secured at the skin with Ethilon retention sutures. Infusion catheters were connected to tPA infusion as well as the EKOS ultrasound infusion system. TPA infusion was begun at 1645 hr via bilateral infusion catheters with a each catheter infusing at a rate of 1 milligram/hour. Infusion will be continued and monitored overnight for a total of 12 hr of infusion via each catheter system. At that time, infusion will be switched to saline and repeat pulmonary artery pressure measurements made tomorrow morning.  COMPLICATIONS: None  FINDINGS: During femoral venous catheterization, there was inadvertent puncture of the femoral artery with advancement of a micropuncture dilator over a guidewire. This necessitated placing a 4 French sheath after arterial access in order to decrease risk of arterial bleeding during thrombolytic therapy.  After initial catheterization of the main pulmonary artery, pressure measurement demonstrates significant elevation of pulmonary artery pressures with measurement of 50/25 mm Hg with mean of 36 mm Hg. Bilateral pulmonary arteriography confirms the presence of significant clot burden extending into lower lobe arteries bilaterally. Infusion catheter  systems were able to be well positioned with the right infusion catheter length spanning from the lower lobe pulmonary artery back into the main pulmonary artery. The left pulmonary infusion catheter spans from a lower lobe pulmonary artery back into the proximal left pulmonary artery.  IMPRESSION: Pulmonary artery pressure measurements demonstrate significant elevated pulmonary artery pressure measuring 50/25 mm Hg with mean of 36 mm Hg. Bilateral pulmonary arterial catheter directed ultrasound assistant thrombolytic therapy was begun via EKOS catheters at a rate of 1 mg of tPA per hr. Infusions will be continued bilaterally for a total of 12 hr. Repeat pulmonary artery pressure measurements and catheter removal will be performed tomorrow morning.   Electronically Signed   By: Aletta Edouard M.D.   On: 06/12/2013 12:07   Ir Jacolyn Reedy F/u Eval Art/ven Final Day (ms)  06/12/2013   CLINICAL DATA:  Sub massive pulmonary emboli, status post 12 hr of catheter directed bilateral pulmonary artery thrombolytic infusion.  EXAM: IR THROMB F/U EVAL ART/VEN FINAL DAY  MEDICATIONS: None  CONTRAST:  None  FLUOROSCOPY TIME:  6 seconds  PROCEDURE: The coaxial ultrasound wires were removed from bilateral pulmonary arterial infusion catheters. Fluoroscopic inspection performed. Bilateral pressure measurements obtained. The infusion catheters were removed. The right femoral sheaths were left in place to flush system.  Complications: Right groin hematoma noted since initial placement  FINDINGS: Bilateral pulmonary arterial catheters stable in position since the initial placement .  Pressure measurements as follows:  Right pulmonary artery 36/20 (26) mmHg  Left pulmonary artery 34/21 (25) mmHg  IMPRESSION: 1. Significant improvement in systolic and mean pulmonary arterial pressures after catheter directed pulmonary artery thrombolytic infusion. 2. The infusion catheters were removed. Femoral sheaths will be removed after heparin has  been held 4 hr.   Electronically Signed   By: Arne Cleveland M.D.   On: 06/12/2013 10:15    Shanker Kristeen Mans, MD  [facility] Pager:[phone]  If 7PM-7AM, please contact night-coverage [email or web address] Password [email or web address] 06/14/2013, 10:33 AM   LOS: 3 days

## 2013-06-15 LAB — LUPUS ANTICOAGULANT PANEL
DRVVT: 39.8 secs (ref <42.9)
LUPUS ANTICOAGULANT: NOT DETECTED
PTT Lupus Anticoagulant: 34.5 secs (ref 28.0–43.0)

## 2013-06-15 LAB — CARDIOLIPIN ANTIBODIES, IGG, IGM, IGA
ANTICARDIOLIPIN IGA: 4 U/mL — AB (ref <22)
ANTICARDIOLIPIN IGM: 1 [MPL'U]/mL — AB (ref <11)
Anticardiolipin IgG: 2 GPL U/mL — ABNORMAL LOW (ref <23)

## 2013-06-15 LAB — BETA-2-GLYCOPROTEIN I ABS, IGG/M/A
BETA 2 GLYCO I IGG: 9 G Units (ref <20)
BETA-2-GLYCOPROTEIN I IGM: 9 M Units (ref <20)
Beta-2-Glycoprotein I IgA: 17 A Units (ref <20)

## 2013-06-15 LAB — PROTEIN C ACTIVITY: Protein C Activity: 104 % (ref 75–133)

## 2013-06-15 LAB — PROTEIN S ACTIVITY: Protein S Activity: 74 % (ref 69–129)

## 2013-06-15 MED ORDER — RIVAROXABAN 20 MG PO TABS
20.0000 mg | ORAL_TABLET | Freq: Every day | ORAL | Status: DC
Start: 2013-06-15 — End: 2013-11-23

## 2013-06-15 MED ORDER — RIVAROXABAN (XARELTO) VTE STARTER PACK (15 & 20 MG): ORAL_TABLET | ORAL | Status: DC

## 2013-06-15 NOTE — Discharge Summary (Signed)
Physician Discharge Summary  [name] [id number] DOB: [birth year] DOA: 06/11/2013  PCP: Carlus Pavlov, MD  Admit date: 06/11/2013 Discharge date: 06/15/2013  Time spent: 60 minutes  Recommendations for Outpatient Follow-up:  1. Follow up with Dr. Kathlene Cote of IR:   ref recent thrombectory 2. Pulmonology assessment with Rexene Edison, NP 3. Follow up with PCP post hospitalization, life threatening PE and DVT identified.  Started on [place]. 4. Please follow hypercoagulable Labs in [facility] after discharge. (Protein C, Protein S, Lupus anticoagulant, Factor 5 leiden, etc..)  Discharge Diagnoses:  Principal Problem:   Dyspnea Active Problems:   HIP PAIN, RIGHT   Varicose veins of lower extremities with other complications   Diabetes mellitus   Chest pain   Acute respiratory failure with hypoxia   RVF (right ventricular failure)   Discharge Condition: Stable  Diet recommendation: Low sodium heart healthy  Filed Weights   06/11/13 0523  Weight: 125.193 kg (276 lb)    History of present illness:  53 y/o F PMH DM, GERD, Bleeding ulcer, anemia, had been SOB for several weeks, went to PCP and Dx with virus. Went back on 5/4 and was sent for CXR & Echo on 5/6. She reported an episode of extreme SOB, tunnel vision and then a syncopal episode when bending down. She was having CP and SOB, tachypnea, tachycardia, low oxygen saturation, denied cough, hemoptysis, leg pain or prior DVTs.   ED work up: CXR clear, d-dimer 13.6, troponin 0.44, CTA: massive PE w/ R heart strain, B/L lower extremity doppler: R posterior tibial DVT.     Hospital Course:  Principal Problem: Acute respiratory failure with hypoxia secondary to Pulmonary embolism -Secondary to large pulmonary embolism  -Better after treatment with catheter directed Lysis and anticoagulation  -Weaned of oxygen and minimal ambulation tolerated  Active Problems: Venous thromboembolism- acute sub-massive PE with R LE DVT- with  right ventricular strain  - Presented with CP and SOB, further evaluation with a 2-D echocardiogram showed right ventricular strain, PCCM was consulted, recommendations were to proceed with ultrasound assisted transcatheter lytic therapy of bilateral pulmonary embolism. This was done on 5/7, patient was subsequently monitored in the intensive care unit. Patient improved significantly, and subsequently started on Xarelto 5/9, and transferred to the hospitalist service on 5/10. Repeat CT angiogram Chest done on 5/10 shows significantly decreased clot burden.  - Hypercoagulable panel pending, instructed patient to continue with anticoagulation for the time being and followup with hematology in the outpatient setting. Etiology/his factors of VTE unknown at this time. -Bilateral LE venous duplex: R posterior tibial vein DVT -D/C on Xarelto  Varicose veins of lower extremities with other complications -Uses TED hose  Diabetes mellitus -Diet controlled, monitor BGL at home  Consultations:  Interventional radiology  Pulmonology - critical care.  Discharge Exam: Filed Vitals:   06/15/13 0422  BP: 111/71  Pulse: 61  Temp: 97.5 F (36.4 C)  Resp: 18    Gen Exam: Awake and alert with clear speech. NAD Neck: Supple, No JVD.  Chest: B/L Clear.  CVS: S1 S2 Regular, no murmurs.  Abdomen: soft, BS +, non tender, non distended.  Extremities: edema to L LE-reported normal by Pt, lower extremities warm to touch.  Neurologic: Non Focal. Skin: No Rash.    Discharge Instructions      Discharge Orders   Future Appointments Provider Department Dept Phone   06/22/2013 9:00 AM Melvenia Needles, NP [facility] [phone]   06/25/2013 4:00 PM [facility] Echo Echo 3  [facility] ECHO [address] [phone]   07/14/2013 8:00 AM [facility] [phone]   Future Orders Complete By Expires   Diet - low sodium heart healthy  As directed     Increase activity slowly  As directed        Medication List    STOP taking these medications       aspirin 81 MG chewable tablet     Cefixime 200 MG Chew      TAKE these medications       CALCIUM 1000 + D PO  Take 1,000 mg by mouth daily.     furosemide 20 MG tablet  Commonly known as:  LASIX  Take 20 mg by mouth daily.     LINZESS 145 MCG Caps capsule  Generic drug:  Linaclotide  Take 145 mcg by mouth daily as needed (for IBS pain).     multivitamins ther. w/minerals Tabs tablet  Take 1 tablet by mouth daily.     omeprazole 20 MG capsule  Commonly known as:  PRILOSEC  Take 20 mg by mouth 2 (two) times daily before a meal.     Rivaroxaban 15 & 20 MG Tbpk  Take as directed on package: Start with one 15mg  tablet by mouth twice a day with food. On Day 22, switch to one 20mg  tablet once a day with food.     rivaroxaban 20 MG Tabs tablet  Commonly known as:  XARELTO  Take 1 tablet (20 mg total) by mouth daily with supper.       Allergies  Allergen Reactions  . Ancef [Cefazolin Sodium] Nausea And Vomiting  . Contrast Media [Iodinated Diagnostic Agents]     apnea  . Penicillins Other (See Comments)    Patient does not recall the reaction, it happened when she was a child   Follow-up Information   Follow up with CAMERON,JOHN, MD. Schedule an appointment as soon as possible for a visit in 2 weeks.   Specialty:  Family Medicine   Contact information:   [address] [place] [address] [phone]       Follow up with PARRETT,TAMMY, NP On 06/22/2013. (9:00 am)    Specialty:  Nurse Practitioner   Contact information:   [address] [place] [address] [phone]       Follow up with [facility] Richardson T, MD In 3 weeks.   Specialty:  Interventional Radiology   Contact information:   [address] [place] [address] [phone]       Follow up with [facility]. (Hematology follow up in [place].)        The results  of significant diagnostics from this hospitalization (including imaging, microbiology, ancillary and laboratory) are listed below for reference.    Significant Diagnostic Studies:  2D ECHO with contrast  12-Jun-2013 LV EF: 55% - 60%. Study Conclusions - Left ventricle: The cavity size was normal. Systolic function was normal. The estimated ejection fraction was in the range of 55% to 60%. Wall motion was normal; there were no regional wall motion abnormalities. - Ventricular septum: Septal motion showed paradox. The contour showed diastolic flattening. These changes are consistent with RV volume overload. - Right ventricle: The cavity size was moderately dilated. - Right atrium: The atrium was moderately to severely dilated. - Atrial septum: The septum bowed from right to left, consistent with increased right atrial pressure. There was a probable, medium-sized secundum atrial septal defect. Doppler showed  a small bidirectional shunt, in the baseline state. There was an atrial septal aneurysm. - Pulmonary arteries: Systolic pressure was mildly increased. PA peak pressure: 29mm Hg (S). Impressions:  - Findings suggest a component of chronic volume overload of the right heart due to atrial septal defect, as RV systolic function is normal and the RA is markedly dilated. Acute cor pulmonale (e.g. pulmonary embolism) could explain the presence of elevated right atrial pressure and increased right to left shunt, in the absence of severe pulmonary artery hypertension. Transthoracic echocardiography. M-mode, complete 2D,spectral Doppler, and color Doppler.   Ct Angio Chest Pe W/cm &/or Wo Cm  06/14/2013   CLINICAL DATA:  Acute pulmonary embolism with right heart strain. Status post catheter like cysts of emboli. Followup pulmonary embolism burden and right heart strain.  EXAM: CT ANGIOGRAPHY CHEST WITH CONTRAST  TECHNIQUE: Multidetector CT imaging of the chest was performed using the standard protocol during bolus  administration of intravenous contrast. Multiplanar CT image reconstructions and MIPs were obtained to evaluate the vascular anatomy.  CONTRAST:  81mL OMNIPAQUE IOHEXOL 350 MG/ML SOLN  COMPARISON:  06/11/2013  FINDINGS: There has been significant decrease in size and extent of bilateral pulmonary emboli compared to prior exam. Previously seen saddle embolus is no longer visualized.  Calculated RV/OV ratio is now 0.92, which is decreased from 1.15 on previous exam and is consistent with decreased right heart strain.  Small areas of peripheral pulmonary opacity in the lower lobes show no significant change are suspicious for areas of pulmonary infarction. No new or worsening areas of pulmonary infiltrate or seen. No evidence of mass or lymphadenopathy. No evidence of pleural or pericardial effusion.  Review of the MIP images confirms the above findings.  IMPRESSION: Significant decrease in bilateral pulmonary embolism burden, as well as decreased RV/LV ratio consistent with decrease in right heart strain.  Persistent small areas of peripheral pulmonary airspace disease in both lower lobes, suspicious for pulmonary infarcts.   Electronically Signed   By: Earle Gell M.D.   On: 06/14/2013 09:09   Ct Angio Chest Pe W/cm &/or Wo Cm  06/11/2013   CLINICAL DATA:  53 year old female with chest pain and shortness of Breath. Initial encounter.  EXAM: CT ANGIOGRAPHY CHEST WITH CONTRAST  TECHNIQUE: Multidetector CT imaging of the chest was performed using the standard protocol during bolus administration of intravenous contrast. Multiplanar CT image reconstructions and MIPs were obtained to evaluate the vascular anatomy.  CONTRAST:  20mL OMNIPAQUE IOHEXOL 350 MG/ML SOLN  COMPARISON:  Chest CTA 11/09/2009.  FINDINGS: Good contrast bolus timing in the pulmonary arterial tree. Saddle embolus with bilateral central pulmonary artery filling defects. Bilateral upper and lower lobe clot propagating into the segmental and subsegmental  arteries.  Right ventricle to left ventricle ratio calculated at 1.15.  Major airways are patent. Scattered bilateral peripheral sometimes wedge-shaped ground-glass and more confluent pulmonary opacity. Superimposed dependent atelectasis.  No pericardial effusion. Up to only trace bilateral pleural effusions.  No acute osseous abnormality identified.  Negative visualized upper abdominal viscera except for sequelae of gastric banding.  Negative visualized aorta. No mediastinal lymphadenopathy. No axillary lymphadenopathy.  Review of the MIP images confirms the above findings.  IMPRESSION: 1. Positive for acute PE with CTevidence of right heart strain (RV/LV Ratio = 1.2) consistent with at least submassive (intermediate risk) PE. The presence of right heart strain has been associated with an increased risk of morbidity and mortality. 2. Scattered small bilateral peripheral pulmonary infarcts. Superimposed dependent atelectasis. Only trace  pleural fluid.  Critical Value/emergent results were called by telephone at the time of interpretation on 06/11/2013 at 35 AM to Dr. Regenia Skeeter in the ER who verbally acknowledged these results.   Electronically Signed   By: Lars Pinks M.D.   On: 06/11/2013 11:12   Ir Angiogram Pulmonary Bilateral Selective  06/12/2013   CLINICAL DATA:  Submassive bilateral acute pulmonary embolism with saddle component across the main pulmonary artery bifurcation. Evidence by CTA and echocardiography of associated right heart strain and pulmonary hypertension by echo. The patient has been evaluated and is a candidate for transcatheter thrombolytic infusion therapy.  EXAM: 1. ULTRASOUND GUIDANCE FOR VASCULAR ACCESS OF THE RIGHT COMMON FEMORAL VEIN X 2 2. BILATERAL PULMONARY ARTERIOGRAPHY INCLUDING SELECTIVE CATHETERIZATION OF BOTH LOWER LOBE PULMONARY ARTERIES 3. TRANSCATHETER ULTRASOUND ASSISTED THROMBOLYTIC INFUSION THERAPY OF BILATERAL PULMONARY ARTERIES  COMPARISON:  CTA of the chest on  06/11/2013.  MEDICATIONS: Sedation:  1 mg IV Versed sec, 50 mcg IV fentanyl.  Total Moderate Sedation Time:  38 minutes.  CONTRAST:  39mL OMNIPAQUE IOHEXOL 300 MG/ML  SOLN  FLUOROSCOPY TIME:  12 min and 24 seconds.  PROCEDURE: Prior to the procedure, informed consent was obtained from the patient. Treatment alternatives were discussed in detail. Indications and risks for thrombolytics therapy were discussed including bleeding risk. The patient gave consent to proceed with pulmonary arteriography and planned bilateral thrombolytic infusion therapy.  Both groins were prepped with Betadine and sterilely prepped and draped. Maximal sterile barrier technique was utilized including caps, mask, sterile gowns, sterile gloves, sterile drape, hand hygiene and skin antiseptic.  Ultrasound was used to confirm patency of the right common femoral vein. Under direct ultrasound guidance, access was performed of the right common femoral vein utilizing a micropuncture set. A 6 French sheath was placed over a wire. Additional parallel access of the common femoral vein was performed with a second puncture under ultrasound guidance. A second 6 French sheath was then placed over a wire. During access, puncture of the right common femoral artery was inadvertently performed. A 4 French sheath was advanced into the right common femoral artery.  A 5 French catheter was advanced via one of the venous sheaths into the inferior vena cava and further into the right atrium and right ventricle. The catheter was then advanced into the main pulmonary artery and initial pulmonary artery pressure readings made. Catheter access was then performed in the right pulmonary artery and selective pulmonary arteriography performed of the right pulmonary artery is well as a lower lobe branch. An exchange length guidewire was then advanced into the right lower lobe pulmonary artery.  Through the second femoral venous sheath, a 5 French catheter was advanced into  the IVC, right heart and main pulmonary artery. The catheter was further advanced into the left pulmonary artery over a guidewire and selective arteriography performed. A guidewire was advanced into the left lower lobe pulmonary artery.  A 6 French EKOS infusion catheter was advanced over the right pulmonary arterial guidewire and into the right lower lobe pulmonary artery. An 18 cm distal infusion length catheter was chosen for placement. The coaxial ultrasound assisted infusion fiber was then advanced through the outer catheter.  A second 6 French EKOS infusion catheter was then advanced over the left pulmonary arterial guidewire into the left lower lobe pulmonary artery. A 12 cm distal infusion length was chosen for placement. The coaxial ultrasound fiber was then advanced through the outer catheter.  Femoral venous and arterial sheaths were connected to saline  drips and secured at the skin with Ethilon retention sutures. Infusion catheters were connected to tPA infusion as well as the EKOS ultrasound infusion system. TPA infusion was begun at 1645 hr via bilateral infusion catheters with a each catheter infusing at a rate of 1 milligram/hour. Infusion will be continued and monitored overnight for a total of 12 hr of infusion via each catheter system. At that time, infusion will be switched to saline and repeat pulmonary artery pressure measurements made tomorrow morning.  COMPLICATIONS: None  FINDINGS: During femoral venous catheterization, there was inadvertent puncture of the femoral artery with advancement of a micropuncture dilator over a guidewire. This necessitated placing a 4 French sheath after arterial access in order to decrease risk of arterial bleeding during thrombolytic therapy.  After initial catheterization of the main pulmonary artery, pressure measurement demonstrates significant elevation of pulmonary artery pressures with measurement of 50/25 mm Hg with mean of 36 mm Hg. Bilateral pulmonary  arteriography confirms the presence of significant clot burden extending into lower lobe arteries bilaterally. Infusion catheter systems were able to be well positioned with the right infusion catheter length spanning from the lower lobe pulmonary artery back into the main pulmonary artery. The left pulmonary infusion catheter spans from a lower lobe pulmonary artery back into the proximal left pulmonary artery.  IMPRESSION: Pulmonary artery pressure measurements demonstrate significant elevated pulmonary artery pressure measuring 50/25 mm Hg with mean of 36 mm Hg. Bilateral pulmonary arterial catheter directed ultrasound assistant thrombolytic therapy was begun via EKOS catheters at a rate of 1 mg of tPA per hr. Infusions will be continued bilaterally for a total of 12 hr. Repeat pulmonary artery pressure measurements and catheter removal will be performed tomorrow morning.   Electronically Signed   By: Aletta Edouard M.D.   On: 06/12/2013 12:07   Ir Angiogram Selective Each Additional Vessel  06/12/2013   CLINICAL DATA:  Submassive bilateral acute pulmonary embolism with saddle component across the main pulmonary artery bifurcation. Evidence by CTA and echocardiography of associated right heart strain and pulmonary hypertension by echo. The patient has been evaluated and is a candidate for transcatheter thrombolytic infusion therapy.  EXAM: 1. ULTRASOUND GUIDANCE FOR VASCULAR ACCESS OF THE RIGHT COMMON FEMORAL VEIN X 2 2. BILATERAL PULMONARY ARTERIOGRAPHY INCLUDING SELECTIVE CATHETERIZATION OF BOTH LOWER LOBE PULMONARY ARTERIES 3. TRANSCATHETER ULTRASOUND ASSISTED THROMBOLYTIC INFUSION THERAPY OF BILATERAL PULMONARY ARTERIES  COMPARISON:  CTA of the chest on 06/11/2013.  MEDICATIONS: Sedation:  1 mg IV Versed sec, 50 mcg IV fentanyl.  Total Moderate Sedation Time:  38 minutes.  CONTRAST:  7mL OMNIPAQUE IOHEXOL 300 MG/ML  SOLN  FLUOROSCOPY TIME:  12 min and 24 seconds.  PROCEDURE: Prior to the procedure,  informed consent was obtained from the patient. Treatment alternatives were discussed in detail. Indications and risks for thrombolytics therapy were discussed including bleeding risk. The patient gave consent to proceed with pulmonary arteriography and planned bilateral thrombolytic infusion therapy.  Both groins were prepped with Betadine and sterilely prepped and draped. Maximal sterile barrier technique was utilized including caps, mask, sterile gowns, sterile gloves, sterile drape, hand hygiene and skin antiseptic.  Ultrasound was used to confirm patency of the right common femoral vein. Under direct ultrasound guidance, access was performed of the right common femoral vein utilizing a micropuncture set. A 6 French sheath was placed over a wire. Additional parallel access of the common femoral vein was performed with a second puncture under ultrasound guidance. A second 6 French sheath was then placed  over a wire. During access, puncture of the right common femoral artery was inadvertently performed. A 4 French sheath was advanced into the right common femoral artery.  A 5 French catheter was advanced via one of the venous sheaths into the inferior vena cava and further into the right atrium and right ventricle. The catheter was then advanced into the main pulmonary artery and initial pulmonary artery pressure readings made. Catheter access was then performed in the right pulmonary artery and selective pulmonary arteriography performed of the right pulmonary artery is well as a lower lobe branch. An exchange length guidewire was then advanced into the right lower lobe pulmonary artery.  Through the second femoral venous sheath, a 5 French catheter was advanced into the IVC, right heart and main pulmonary artery. The catheter was further advanced into the left pulmonary artery over a guidewire and selective arteriography performed. A guidewire was advanced into the left lower lobe pulmonary artery.  A 6 French  EKOS infusion catheter was advanced over the right pulmonary arterial guidewire and into the right lower lobe pulmonary artery. An 18 cm distal infusion length catheter was chosen for placement. The coaxial ultrasound assisted infusion fiber was then advanced through the outer catheter.  A second 6 French EKOS infusion catheter was then advanced over the left pulmonary arterial guidewire into the left lower lobe pulmonary artery. A 12 cm distal infusion length was chosen for placement. The coaxial ultrasound fiber was then advanced through the outer catheter.  Femoral venous and arterial sheaths were connected to saline drips and secured at the skin with Ethilon retention sutures. Infusion catheters were connected to tPA infusion as well as the EKOS ultrasound infusion system. TPA infusion was begun at 1645 hr via bilateral infusion catheters with a each catheter infusing at a rate of 1 milligram/hour. Infusion will be continued and monitored overnight for a total of 12 hr of infusion via each catheter system. At that time, infusion will be switched to saline and repeat pulmonary artery pressure measurements made tomorrow morning.  COMPLICATIONS: None  FINDINGS: During femoral venous catheterization, there was inadvertent puncture of the femoral artery with advancement of a micropuncture dilator over a guidewire. This necessitated placing a 4 French sheath after arterial access in order to decrease risk of arterial bleeding during thrombolytic therapy.  After initial catheterization of the main pulmonary artery, pressure measurement demonstrates significant elevation of pulmonary artery pressures with measurement of 50/25 mm Hg with mean of 36 mm Hg. Bilateral pulmonary arteriography confirms the presence of significant clot burden extending into lower lobe arteries bilaterally. Infusion catheter systems were able to be well positioned with the right infusion catheter length spanning from the lower lobe pulmonary  artery back into the main pulmonary artery. The left pulmonary infusion catheter spans from a lower lobe pulmonary artery back into the proximal left pulmonary artery.  IMPRESSION: Pulmonary artery pressure measurements demonstrate significant elevated pulmonary artery pressure measuring 50/25 mm Hg with mean of 36 mm Hg. Bilateral pulmonary arterial catheter directed ultrasound assistant thrombolytic therapy was begun via EKOS catheters at a rate of 1 mg of tPA per hr. Infusions will be continued bilaterally for a total of 12 hr. Repeat pulmonary artery pressure measurements and catheter removal will be performed tomorrow morning.   Electronically Signed   By: Aletta Edouard M.D.   On: 06/12/2013 12:07   Ir US Guide Vasc Access Right  06/12/2013   CLINICAL DATA:  Submassive bilateral acute pulmonary embolism with saddle component across  the main pulmonary artery bifurcation. Evidence by CTA and echocardiography of associated right heart strain and pulmonary hypertension by echo. The patient has been evaluated and is a candidate for transcatheter thrombolytic infusion therapy.  EXAM: 1. ULTRASOUND GUIDANCE FOR VASCULAR ACCESS OF THE RIGHT COMMON FEMORAL VEIN X 2 2. BILATERAL PULMONARY ARTERIOGRAPHY INCLUDING SELECTIVE CATHETERIZATION OF BOTH LOWER LOBE PULMONARY ARTERIES 3. TRANSCATHETER ULTRASOUND ASSISTED THROMBOLYTIC INFUSION THERAPY OF BILATERAL PULMONARY ARTERIES  COMPARISON:  CTA of the chest on 06/11/2013.  MEDICATIONS: Sedation:  1 mg IV Versed sec, 50 mcg IV fentanyl.  Total Moderate Sedation Time:  38 minutes.  CONTRAST:  48mL OMNIPAQUE IOHEXOL 300 MG/ML  SOLN  FLUOROSCOPY TIME:  12 min and 24 seconds.  PROCEDURE: Prior to the procedure, informed consent was obtained from the patient. Treatment alternatives were discussed in detail. Indications and risks for thrombolytics therapy were discussed including bleeding risk. The patient gave consent to proceed with pulmonary arteriography and planned  bilateral thrombolytic infusion therapy.  Both groins were prepped with Betadine and sterilely prepped and draped. Maximal sterile barrier technique was utilized including caps, mask, sterile gowns, sterile gloves, sterile drape, hand hygiene and skin antiseptic.  Ultrasound was used to confirm patency of the right common femoral vein. Under direct ultrasound guidance, access was performed of the right common femoral vein utilizing a micropuncture set. A 6 French sheath was placed over a wire. Additional parallel access of the common femoral vein was performed with a second puncture under ultrasound guidance. A second 6 French sheath was then placed over a wire. During access, puncture of the right common femoral artery was inadvertently performed. A 4 French sheath was advanced into the right common femoral artery.  A 5 French catheter was advanced via one of the venous sheaths into the inferior vena cava and further into the right atrium and right ventricle. The catheter was then advanced into the main pulmonary artery and initial pulmonary artery pressure readings made. Catheter access was then performed in the right pulmonary artery and selective pulmonary arteriography performed of the right pulmonary artery is well as a lower lobe branch. An exchange length guidewire was then advanced into the right lower lobe pulmonary artery.  Through the second femoral venous sheath, a 5 French catheter was advanced into the IVC, right heart and main pulmonary artery. The catheter was further advanced into the left pulmonary artery over a guidewire and selective arteriography performed. A guidewire was advanced into the left lower lobe pulmonary artery.  A 6 French EKOS infusion catheter was advanced over the right pulmonary arterial guidewire and into the right lower lobe pulmonary artery. An 18 cm distal infusion length catheter was chosen for placement. The coaxial ultrasound assisted infusion fiber was then advanced  through the outer catheter.  A second 6 French EKOS infusion catheter was then advanced over the left pulmonary arterial guidewire into the left lower lobe pulmonary artery. A 12 cm distal infusion length was chosen for placement. The coaxial ultrasound fiber was then advanced through the outer catheter.  Femoral venous and arterial sheaths were connected to saline drips and secured at the skin with Ethilon retention sutures. Infusion catheters were connected to tPA infusion as well as the EKOS ultrasound infusion system. TPA infusion was begun at 1645 hr via bilateral infusion catheters with a each catheter infusing at a rate of 1 milligram/hour. Infusion will be continued and monitored overnight for a total of 12 hr of infusion via each catheter system. At that time, infusion  will be switched to saline and repeat pulmonary artery pressure measurements made tomorrow morning.  COMPLICATIONS: None  FINDINGS: During femoral venous catheterization, there was inadvertent puncture of the femoral artery with advancement of a micropuncture dilator over a guidewire. This necessitated placing a 4 French sheath after arterial access in order to decrease risk of arterial bleeding during thrombolytic therapy.  After initial catheterization of the main pulmonary artery, pressure measurement demonstrates significant elevation of pulmonary artery pressures with measurement of 50/25 mm Hg with mean of 36 mm Hg. Bilateral pulmonary arteriography confirms the presence of significant clot burden extending into lower lobe arteries bilaterally. Infusion catheter systems were able to be well positioned with the right infusion catheter length spanning from the lower lobe pulmonary artery back into the main pulmonary artery. The left pulmonary infusion catheter spans from a lower lobe pulmonary artery back into the proximal left pulmonary artery.  IMPRESSION: Pulmonary artery pressure measurements demonstrate significant elevated pulmonary  artery pressure measuring 50/25 mm Hg with mean of 36 mm Hg. Bilateral pulmonary arterial catheter directed ultrasound assistant thrombolytic therapy was begun via EKOS catheters at a rate of 1 mg of tPA per hr. Infusions will be continued bilaterally for a total of 12 hr. Repeat pulmonary artery pressure measurements and catheter removal will be performed tomorrow morning.   Electronically Signed   By: Aletta Edouard M.D.   On: 06/12/2013 12:07   Dg Chest Portable 1 View  06/11/2013   CLINICAL DATA:  Chest pain and shortness of breath  EXAM: PORTABLE CHEST - 1 VIEW  COMPARISON:  11/09/2009  FINDINGS: Borderline cardiomegaly, accentuated by technique. Unchanged upper mediastinal contours. Prominent markings at the bases without overt edema. No consolidation, effusion, or pneumothorax.  IMPRESSION: No active disease.   Electronically Signed   By: Jorje Guild M.D.   On: 06/11/2013 06:08   Ir Infusion Thrombol Arterial Initial (ms)  06/12/2013   CLINICAL DATA:  Submassive bilateral acute pulmonary embolism with saddle component across the main pulmonary artery bifurcation. Evidence by CTA and echocardiography of associated right heart strain and pulmonary hypertension by echo. The patient has been evaluated and is a candidate for transcatheter thrombolytic infusion therapy.  EXAM: 1. ULTRASOUND GUIDANCE FOR VASCULAR ACCESS OF THE RIGHT COMMON FEMORAL VEIN X 2 2. BILATERAL PULMONARY ARTERIOGRAPHY INCLUDING SELECTIVE CATHETERIZATION OF BOTH LOWER LOBE PULMONARY ARTERIES 3. TRANSCATHETER ULTRASOUND ASSISTED THROMBOLYTIC INFUSION THERAPY OF BILATERAL PULMONARY ARTERIES  COMPARISON:  CTA of the chest on 06/11/2013.  MEDICATIONS: Sedation:  1 mg IV Versed sec, 50 mcg IV fentanyl.  Total Moderate Sedation Time:  38 minutes.  CONTRAST:  63mL OMNIPAQUE IOHEXOL 300 MG/ML  SOLN  FLUOROSCOPY TIME:  12 min and 24 seconds.  PROCEDURE: Prior to the procedure, informed consent was obtained from the patient. Treatment  alternatives were discussed in detail. Indications and risks for thrombolytics therapy were discussed including bleeding risk. The patient gave consent to proceed with pulmonary arteriography and planned bilateral thrombolytic infusion therapy.  Both groins were prepped with Betadine and sterilely prepped and draped. Maximal sterile barrier technique was utilized including caps, mask, sterile gowns, sterile gloves, sterile drape, hand hygiene and skin antiseptic.  Ultrasound was used to confirm patency of the right common femoral vein. Under direct ultrasound guidance, access was performed of the right common femoral vein utilizing a micropuncture set. A 6 French sheath was placed over a wire. Additional parallel access of the common femoral vein was performed with a second puncture under ultrasound guidance. A second  6 French sheath was then placed over a wire. During access, puncture of the right common femoral artery was inadvertently performed. A 4 French sheath was advanced into the right common femoral artery.  A 5 French catheter was advanced via one of the venous sheaths into the inferior vena cava and further into the right atrium and right ventricle. The catheter was then advanced into the main pulmonary artery and initial pulmonary artery pressure readings made. Catheter access was then performed in the right pulmonary artery and selective pulmonary arteriography performed of the right pulmonary artery is well as a lower lobe branch. An exchange length guidewire was then advanced into the right lower lobe pulmonary artery.  Through the second femoral venous sheath, a 5 French catheter was advanced into the IVC, right heart and main pulmonary artery. The catheter was further advanced into the left pulmonary artery over a guidewire and selective arteriography performed. A guidewire was advanced into the left lower lobe pulmonary artery.  A 6 French EKOS infusion catheter was advanced over the right  pulmonary arterial guidewire and into the right lower lobe pulmonary artery. An 18 cm distal infusion length catheter was chosen for placement. The coaxial ultrasound assisted infusion fiber was then advanced through the outer catheter.  A second 6 French EKOS infusion catheter was then advanced over the left pulmonary arterial guidewire into the left lower lobe pulmonary artery. A 12 cm distal infusion length was chosen for placement. The coaxial ultrasound fiber was then advanced through the outer catheter.  Femoral venous and arterial sheaths were connected to saline drips and secured at the skin with Ethilon retention sutures. Infusion catheters were connected to tPA infusion as well as the EKOS ultrasound infusion system. TPA infusion was begun at 1645 hr via bilateral infusion catheters with a each catheter infusing at a rate of 1 milligram/hour. Infusion will be continued and monitored overnight for a total of 12 hr of infusion via each catheter system. At that time, infusion will be switched to saline and repeat pulmonary artery pressure measurements made tomorrow morning.  COMPLICATIONS: None  FINDINGS: During femoral venous catheterization, there was inadvertent puncture of the femoral artery with advancement of a micropuncture dilator over a guidewire. This necessitated placing a 4 French sheath after arterial access in order to decrease risk of arterial bleeding during thrombolytic therapy.  After initial catheterization of the main pulmonary artery, pressure measurement demonstrates significant elevation of pulmonary artery pressures with measurement of 50/25 mm Hg with mean of 36 mm Hg. Bilateral pulmonary arteriography confirms the presence of significant clot burden extending into lower lobe arteries bilaterally. Infusion catheter systems were able to be well positioned with the right infusion catheter length spanning from the lower lobe pulmonary artery back into the main pulmonary artery. The left  pulmonary infusion catheter spans from a lower lobe pulmonary artery back into the proximal left pulmonary artery.  IMPRESSION: Pulmonary artery pressure measurements demonstrate significant elevated pulmonary artery pressure measuring 50/25 mm Hg with mean of 36 mm Hg. Bilateral pulmonary arterial catheter directed ultrasound assistant thrombolytic therapy was begun via EKOS catheters at a rate of 1 mg of tPA per hr. Infusions will be continued bilaterally for a total of 12 hr. Repeat pulmonary artery pressure measurements and catheter removal will be performed tomorrow morning.   Electronically Signed   By: Aletta Edouard M.D.   On: 06/12/2013 12:07   Ir Jacolyn Reedy F/u Eval Art/ven Final Day (ms)  06/12/2013   CLINICAL DATA:  Sub  massive pulmonary emboli, status post 12 hr of catheter directed bilateral pulmonary artery thrombolytic infusion.  EXAM: IR THROMB F/U EVAL ART/VEN FINAL DAY  MEDICATIONS: None  CONTRAST:  None  FLUOROSCOPY TIME:  6 seconds  PROCEDURE: The coaxial ultrasound wires were removed from bilateral pulmonary arterial infusion catheters. Fluoroscopic inspection performed. Bilateral pressure measurements obtained. The infusion catheters were removed. The right femoral sheaths were left in place to flush system.  Complications: Right groin hematoma noted since initial placement  FINDINGS: Bilateral pulmonary arterial catheters stable in position since the initial placement .  Pressure measurements as follows:  Right pulmonary artery 36/20 (26) mmHg  Left pulmonary artery 34/21 (25) mmHg  IMPRESSION: 1. Significant improvement in systolic and mean pulmonary arterial pressures after catheter directed pulmonary artery thrombolytic infusion. 2. The infusion catheters were removed. Femoral sheaths will be removed after heparin has been held 4 hr.   Electronically Signed   By: Arne Cleveland M.D.   On: 06/12/2013 10:15    Microbiology: Recent Results (from the past 240 hour(s))  MRSA PCR SCREENING      Status: None   Collection Time    06/11/13  1:57 PM      Result Value Ref Range Status   MRSA by PCR NEGATIVE  NEGATIVE Final   Comment:            The GeneXpert MRSA Assay (FDA     approved for NASAL specimens     only), is one component of a     comprehensive MRSA colonization     surveillance program. It is not     intended to diagnose MRSA     infection nor to guide or     monitor treatment for     MRSA infections.     Labs: Basic Metabolic Panel:  Recent Labs Lab 06/11/13 0530 06/12/13 0330 06/13/13 0304 06/14/13 0525  NA 141 142 144 141  K 4.0 3.9 4.3 4.5  CL 99 107 107 105  CO2  --  25 26 25   GLUCOSE 107* 129* 90 121*  BUN 12 14 17 13   CREATININE 0.70 0.47* 0.53 0.56  CALCIUM  --  8.3* 8.2* 9.0   CBC:  Recent Labs Lab 06/12/13 0240 06/12/13 1120 06/12/13 1940 06/13/13 0304 06/14/13 0525  WBC 5.4 9.7 14.4* 9.8 6.3  HGB 13.3 12.6 13.5 12.3 12.9  HCT 39.6 39.5 41.4 38.1 40.4  MCV 87.8 88.8 88.7 89.0 89.6  PLT 137* 159 191 158 177   Cardiac Enzymes:  Recent Labs Lab 06/11/13 0543 06/11/13 1800 06/12/13 0024  TROPONINI 0.44* 0.40* <0.30   BNP: BNP (last 3 results)  Recent Labs  06/11/13 0625  PROBNP 477.8*      Signed:  Myrla Halsted PA-S [place], [place] [phone]  [facility] 06/15/2013, 12:59 PM  Attending Continues to improve, no major complaints. Not requiring oxygen. Still has some shortness of breath on ambulation-but given large clot burden this is to be expected. CTA chest on 5/10 shows significantly reduced clot burden. Patient is stable to be discharged on Xarelto. Have scheduled her to see PCCM as noted above. I have also asked the patient to get a referral to hematology from a PCP. Stable for discharge.  Nena Alexander MD

## 2013-06-15 NOTE — Progress Notes (Signed)
NURSING PROGRESS NOTE  [name] [id number] Discharge Data: 06/15/2013 11:30AM Attending Provider: Jonetta Osgood, MD EZM:[id number],YYTK, MD     [facility] to be D/C'd Home per MD order.  Discussed with the patient the After Visit Summary and all questions fully answered. All IV's discontinued with no bleeding noted. All belongings returned to patient for patient to take home.   Last Vital Signs:  Blood pressure 111/71, pulse 61, temperature 97.5 F (36.4 C), temperature source Oral, resp. rate 18, height 5\' 6"  (1.676 m), weight 125.193 kg (276 lb), SpO2 95.00%.  Discharge Medication List   Medication List    STOP taking these medications       Cefixime 200 MG Chew      TAKE these medications       aspirin 81 MG chewable tablet  Chew 324 mg by mouth once.     CALCIUM 1000 + D PO  Take 1,000 mg by mouth daily.     furosemide 20 MG tablet  Commonly known as:  LASIX  Take 20 mg by mouth daily.     LINZESS 145 MCG Caps capsule  Generic drug:  Linaclotide  Take 145 mcg by mouth daily as needed (for IBS pain).     multivitamins ther. w/minerals Tabs tablet  Take 1 tablet by mouth daily.     omeprazole 20 MG capsule  Commonly known as:  PRILOSEC  Take 20 mg by mouth 2 (two) times daily before a meal.     Rivaroxaban 15 & 20 MG Tbpk  Take as directed on package: Start with one 15mg  tablet by mouth twice a day with food. On Day 22, switch to one 20mg  tablet once a day with food.     rivaroxaban 20 MG Tabs tablet  Commonly known as:  XARELTO  Take 1 tablet (20 mg total) by mouth daily with supper.         Wallie Renshaw, RN

## 2013-06-17 ENCOUNTER — Ambulatory Visit (INDEPENDENT_AMBULATORY_CARE_PROVIDER_SITE_OTHER)
Admission: RE | Admit: 2013-06-17 | Discharge: 2013-06-17 | Disposition: A | Discharge status: Home / Self Care | Payer: 59 | Source: Ambulatory Visit | Attending: Adult Health | Admitting: Adult Health

## 2013-06-17 ENCOUNTER — Ambulatory Visit (INDEPENDENT_AMBULATORY_CARE_PROVIDER_SITE_OTHER): Payer: 59 | Admitting: Adult Health

## 2013-06-17 ENCOUNTER — Other Ambulatory Visit (INDEPENDENT_AMBULATORY_CARE_PROVIDER_SITE_OTHER): Payer: 59

## 2013-06-17 ENCOUNTER — Encounter: Payer: Self-pay | Admitting: Adult Health

## 2013-06-17 VITALS — BP 128/74 | HR 71 | Temp 98.2°F | Ht 66.0 in | Wt 262.6 lb

## 2013-06-17 DIAGNOSIS — I2699 Other pulmonary embolism without acute cor pulmonale: Secondary | ICD-10-CM

## 2013-06-17 DIAGNOSIS — R0989 Other specified symptoms and signs involving the circulatory and respiratory systems: Secondary | ICD-10-CM

## 2013-06-17 DIAGNOSIS — R0600 Dyspnea, unspecified: Secondary | ICD-10-CM

## 2013-06-17 DIAGNOSIS — R0609 Other forms of dyspnea: Secondary | ICD-10-CM

## 2013-06-17 LAB — CBC WITH DIFFERENTIAL/PLATELET
BASOS PCT: 0.7 % (ref 0.0–3.0)
Basophils Absolute: 0.1 10*3/uL (ref 0.0–0.1)
EOS ABS: 0.4 10*3/uL (ref 0.0–0.7)
Eosinophils Relative: 4.6 % (ref 0.0–5.0)
HCT: 38.9 % (ref 36.0–46.0)
HEMOGLOBIN: 13 g/dL (ref 12.0–15.0)
LYMPHS ABS: 2.4 10*3/uL (ref 0.7–4.0)
Lymphocytes Relative: 30.1 % (ref 12.0–46.0)
MCHC: 33.4 g/dL (ref 30.0–36.0)
MCV: 87.2 fl (ref 78.0–100.0)
MONO ABS: 0.5 10*3/uL (ref 0.1–1.0)
Monocytes Relative: 5.7 % (ref 3.0–12.0)
NEUTROS ABS: 4.7 10*3/uL (ref 1.4–7.7)
Neutrophils Relative %: 58.9 % (ref 43.0–77.0)
Platelets: 244 10*3/uL (ref 150.0–400.0)
RBC: 4.46 Mil/uL (ref 3.87–5.11)
RDW: 13.6 % (ref 11.5–15.5)
WBC: 7.9 10*3/uL (ref 4.0–10.5)

## 2013-06-17 LAB — FACTOR 5 LEIDEN

## 2013-06-17 LAB — BASIC METABOLIC PANEL
BUN: 18 mg/dL (ref 6–23)
CO2: 27 mEq/L (ref 19–32)
CREATININE: 0.6 mg/dL (ref 0.4–1.2)
Calcium: 9.3 mg/dL (ref 8.4–10.5)
Chloride: 104 mEq/L (ref 96–112)
GFR: 115.06 mL/min (ref >60.00)
Glucose, Bld: 81 mg/dL (ref 70–99)
Potassium: 3.8 mEq/L (ref 3.5–5.1)
Sodium: 140 mEq/L (ref 135–145)

## 2013-06-17 LAB — PROTHROMBIN GENE MUTATION

## 2013-06-17 LAB — PROTEIN S, TOTAL: PROTEIN S AG TOTAL: 77 % (ref 60–150)

## 2013-06-17 LAB — PROTEIN C, TOTAL: PROTEIN C, TOTAL: 76 % (ref 72–160)

## 2013-06-17 NOTE — Patient Instructions (Signed)
Continue on starter pack as directed Advance activity as tolerated slowly Stop and rest when you get short of breath Call our office immediately if you see any bleeding Watch leg and groin, bruising, if this worsens, call our office immediately Followup in her office in one week as planned after your echo Please contact office for sooner follow up if symptoms do not improve or worsen or seek emergency care

## 2013-06-19 ENCOUNTER — Telehealth: Payer: Self-pay | Admitting: Adult Health

## 2013-06-19 DIAGNOSIS — R0600 Dyspnea, unspecified: Secondary | ICD-10-CM

## 2013-06-19 NOTE — Telephone Encounter (Signed)
Spoke to [place] regarding echo date. Suanne Marker was unable to reach [facility], [facility] and [facility]. [facility] is unable to perform echo d/t full schedule. Suanne Marker stated she will try again Monday morning to try and move date up. Will await possible new date for echo.

## 2013-06-19 NOTE — Telephone Encounter (Signed)
That is fine with me to send order to move date up , it was scheduled from hospital

## 2013-06-19 NOTE — Telephone Encounter (Signed)
Per OV 06/11/13: Patient Instructions     Continue on starter pack as directed  Advance activity as tolerated slowly  Stop and rest when you get short of breath  Call our office immediately if you see any bleeding  Watch leg and groin, bruising, if this worsens, call our office immediately  Followup in her office in one week as planned after your echo  Please contact office for sooner follow up if symptoms do not improve or worsen or seek emergency care  --  Spoke with pt. She reports TP wanted her to have echo done before Friday. She reports she is scheduled for echo 06/24/13 at 4 PM [facility] cardiovascular on [address]. She wants to know if TP wants her to have this done sooner than that date. Please advise TP thanks

## 2013-06-22 ENCOUNTER — Ambulatory Visit (HOSPITAL_COMMUNITY): Payer: 59

## 2013-06-22 ENCOUNTER — Inpatient Hospital Stay: Payer: 59 | Admitting: Adult Health

## 2013-06-22 ENCOUNTER — Ambulatory Visit (HOSPITAL_COMMUNITY)
Admission: RE | Admit: 2013-06-22 | Discharge: 2013-06-22 | Disposition: A | Discharge status: Home / Self Care | Payer: 59 | Source: Ambulatory Visit | Attending: Cardiovascular Disease | Admitting: Cardiovascular Disease

## 2013-06-22 DIAGNOSIS — R0609 Other forms of dyspnea: Secondary | ICD-10-CM | POA: Insufficient documentation

## 2013-06-22 DIAGNOSIS — R0602 Shortness of breath: Secondary | ICD-10-CM

## 2013-06-22 DIAGNOSIS — I2699 Other pulmonary embolism without acute cor pulmonale: Secondary | ICD-10-CM | POA: Insufficient documentation

## 2013-06-22 DIAGNOSIS — R0989 Other specified symptoms and signs involving the circulatory and respiratory systems: Secondary | ICD-10-CM | POA: Insufficient documentation

## 2013-06-22 DIAGNOSIS — R0600 Dyspnea, unspecified: Secondary | ICD-10-CM

## 2013-06-22 NOTE — Progress Notes (Signed)
   Subjective:    Patient ID: [name], female    DOB: Jan 02, 1961, 53 y.o.   MRN: [id number]  HPI 53 yo seen for initial pulmonary consult during hospital stay for massive PE w/ right heart strain  requiring TPA 06/11/1013    06/19/13 [facility] follow up  Patient presents for a post hospital followup. Patient was admitted may 7 through may 11 for a massive pulmonary embolism with notable right heart strain and right posterior, tibial, DVT. Due to the severity of her pulmonary embolism and notable, right heart strain. Patient did require catheter directed lytic therapy. Repeat CT scan showed a significantly decreased clot burden. Discharged  was discharged on Xarelto  Hypercoaguable panel was neg factor V Leiden,negative lupus anticoagulant . Protein C and protein S were nml .  Homocystine was nml. Antithrombin III was slightly low at 69.   Since discharge. Patient is mildly improved, however, continues to be very weak and short of breath with minimal activity. She does have intermittent coughing see some slight blood-tinged sputum. Seems to be mostly dark in nature. Denies any frank hemoptysis. Patient complains of persistent bruising along the right groin. Patient does state that she feels weak. At times when she stands up or walks for prolonged period of time. She has a repeat 2-D echo scheduled for next week. CBC today shows stable hemoglobin.   Review of Systems Constitutional:   No  weight loss, night sweats,  Fevers, chills,  +fatigue, or  lassitude.  HEENT:   No headaches,  Difficulty swallowing,  Tooth/dental problems, or  Sore throat,                No sneezing, itching, ear ache, nasal congestion, post nasal drip,   CV:  No chest pain,  Orthopnea, PND, swelling in lower extremities, anasarca, dizziness, palpitations, syncope.   GI  No heartburn, indigestion, abdominal pain, nausea, vomiting, diarrhea, change in bowel habits, loss of appetite, bloody stools.   Resp:     No chest wall deformity  Skin: no rash or lesions.  GU: no dysuria, change in color of urine, no urgency or frequency.  No flank pain, no hematuria   MS:  No joint pain or swelling.  No decreased range of motion.  No back pain.  Psych:  No change in mood or affect. No depression or anxiety.  No memory loss.         Objective:   Physical Exam GEN: A/Ox3; pleasant , NAD, well nourished   HEENT:  [place]/AT,  EACs-clear, TMs-wnl, NOSE-clear, THROAT-clear, no lesions, no postnasal drip or exudate noted.   NECK:  Supple w/ fair ROM; no JVD; normal carotid impulses w/o bruits; no thyromegaly or nodules palpated; no lymphadenopathy.  RESP  Clear  P & A; w/o, wheezes/ rales/ or rhonchi.no accessory muscle use, no dullness to percussion  CARD:  RRR, no m/r/g  , tr  peripheral edema, pulses intact, no cyanosis or clubbing. Along right groin extensive ecchymosis, no palpable hematoma noted, no sign thigh swelling. Palpable femoral pulses noted.   GI:   Soft & nt; nml bowel sounds; no organomegaly or masses detected.  Musco: Warm bil, no deformities or joint swelling noted.   Neuro: alert, no focal deficits noted.    Skin: Warm, no lesions or rashes         Assessment & Plan:

## 2013-06-22 NOTE — Assessment & Plan Note (Signed)
Recent admission with massive PE , right DVT , right heart strain S/p cath directed TPA  Improved CT chest w/ less clot burden  For repeat 2 D echo next week  Cont on xarelto  Check cbc today , right groin bruising appears stable w/out hematoma   Plan  ontinue on starter pack as directed Advance activity as tolerated slowly Stop and rest when you get short of breath Call our office immediately if you see any bleeding Watch leg and groin, bruising, if this worsens, call our office immediately Followup in her office in one week as planned after your echo Please contact office for sooner follow up if symptoms do not improve or worsen or seek emergency care

## 2013-06-22 NOTE — Progress Notes (Signed)
2D Echo Performed 06/22/2013    Lanayah Gartley, RCS  

## 2013-06-22 NOTE — Telephone Encounter (Signed)
2D echo has been scheduled for today Monday 06/22/13 at 2:00 at [facility]. [facility]. Called and spoke with patient this am. She is aware of appointment date, time and location (over [employer])  [place] 2nd floor. No prep. Arrive at 1:45. No pre cert required. Rhonda J Cobb

## 2013-06-22 NOTE — Telephone Encounter (Signed)
Spoke with pt and she is aware of her appointment for today at 1:45 for echo. She requested to schedule f/u with TP. This appointment has been scheduled with TP for tomorrow 06/23/13 @ 9:30. Pt aware of this and nothing further is needed.

## 2013-06-23 ENCOUNTER — Ambulatory Visit: Payer: 59 | Admitting: Adult Health

## 2013-06-25 ENCOUNTER — Other Ambulatory Visit (HOSPITAL_COMMUNITY): Payer: 59

## 2013-07-01 ENCOUNTER — Encounter: Payer: Self-pay | Admitting: Adult Health

## 2013-07-01 ENCOUNTER — Ambulatory Visit (INDEPENDENT_AMBULATORY_CARE_PROVIDER_SITE_OTHER): Payer: 59 | Admitting: Adult Health

## 2013-07-01 VITALS — BP 116/74 | HR 62 | Temp 98.3°F | Ht 66.0 in | Wt 262.0 lb

## 2013-07-01 DIAGNOSIS — I2699 Other pulmonary embolism without acute cor pulmonale: Secondary | ICD-10-CM

## 2013-07-01 NOTE — Patient Instructions (Signed)
Continue on Xarelto as directed.  Advance activity as tolerated slowly Stop and rest when you get short of breath Call our office immediately if you see any bleeding Follow up with Dr. Sood in 6 weeks and As needed   Please contact office for sooner follow up if symptoms do not improve or worsen or seek emergency care    

## 2013-07-01 NOTE — Telephone Encounter (Signed)
Had discussed this with patient when she was here in the office today Will forward to my inbasket to follow up w/ TP's note

## 2013-07-02 NOTE — Progress Notes (Addendum)
Subjective:    Patient ID: [name], female    DOB: 06-Dec-1960, 53 y.o.   MRN: [id number]  HPI  53 yo seen for initial pulmonary consult during hospital stay for massive PE w/ right heart strain  requiring TPA 06/11/1013    06/19/13 [facility] follow up  Patient presents for a post hospital followup. Patient was admitted may 7 through may 11 for a massive pulmonary embolism with notable right heart strain and right posterior, tibial, DVT. Due to the severity of her pulmonary embolism and notable, right heart strain. Patient did require catheter directed lytic therapy. Repeat CT scan showed a significantly decreased clot burden. Discharged  was discharged on Xarelto  Hypercoaguable panel was neg factor V Leiden,negative lupus anticoagulant . Protein C and protein S were nml .  Homocystine was nml. Antithrombin III was slightly low at 69.   Since discharge. Patient is mildly improved, however, continues to be very weak and short of breath with minimal activity. She does have intermittent coughing see some slight blood-tinged sputum. Seems to be mostly dark in nature. Denies any frank hemoptysis. Patient complains of persistent bruising along the right groin. Patient does state that she feels weak. At times when she stands up or walks for prolonged period of time. She has a repeat 2-D echo scheduled for next week. CBC today shows stable hemoglobin. >cont on Xarelto ,  07/01/13  Follow up PE/DVT  Presents for a followup for recent hospitalization for his submassive PE, and right DVT. Patient did require catheter directed lytic therapy . Due to severe right heart strain. Patient underwent a 2-D echo on may 18 that showed no evidence of right heart strain. Left ventricle function was normal. Right atrium and right ventricle were normal in size, with normal systolic function. Pulmonary artery systolic pressure was within the normal range. Patient says that she is slowly improving with  increased activity tolerance. However, gets winded easily and is still quite fatigued at times. Patient has some significant bruising along the right thigh and groin area. That is improving slowly. She denies any hemoptysis, orthopnea, PND, or leg swelling.  Review of Systems  Constitutional:   No  weight loss, night sweats,  Fevers, chills,  +fatigue, or  lassitude.  HEENT:   No headaches,  Difficulty swallowing,  Tooth/dental problems, or  Sore throat,                No sneezing, itching, ear ache, nasal congestion, post nasal drip,   CV:  No chest pain,  Orthopnea, PND, swelling in lower extremities, anasarca, dizziness, palpitations, syncope.   GI  No heartburn, indigestion, abdominal pain, nausea, vomiting, diarrhea, change in bowel habits, loss of appetite, bloody stools.   Resp:    No chest wall deformity  Skin: no rash or lesions.  GU: no dysuria, change in color of urine, no urgency or frequency.  No flank pain, no hematuria   MS:  No joint pain or swelling.  No decreased range of motion.  No back pain.  Psych:  No change in mood or affect. No depression or anxiety.  No memory loss.         Objective:   Physical Exam  GEN: A/Ox3; pleasant , NAD, well nourished   HEENT:  [place]/AT,  EACs-clear, TMs-wnl, NOSE-clear, THROAT-clear, no lesions, no postnasal drip or exudate noted.   NECK:  Supple w/ fair ROM; no JVD; normal carotid impulses w/o bruits; no thyromegaly or nodules palpated; no lymphadenopathy.  RESP  Clear  P & A; w/o, wheezes/ rales/ or rhonchi.no accessory muscle use, no dullness to percussion  CARD:  RRR, no m/r/g  , tr  peripheral edema, pulses intact, no cyanosis or clubbing. Along right groin extensive ecchymosis, no palpable hematoma noted, no sign thigh swelling.-resolving   GI:   Soft & nt; nml bowel sounds; no organomegaly or masses detected.  Musco: Warm bil, no deformities or joint swelling noted.   Neuro: alert, no focal deficits noted.     Skin: Warm, no lesions or rashes         Assessment & Plan:

## 2013-07-02 NOTE — Assessment & Plan Note (Signed)
Continue on Xarelto as directed.  Advance activity as tolerated slowly Stop and rest when you get short of breath Call our office immediately if you see any bleeding Follow up with Dr. Halford Chessman in 6 weeks and As needed   Please contact office for sooner follow up if symptoms do not improve or worsen or seek emergency care

## 2013-07-03 NOTE — Telephone Encounter (Signed)
07/01/13 ov note by TP printed and faxed to [facility] as requested by pt with her included information.  Dear Ms. Parrett,   Please fax a copy of the office note from today (07/01/2013) to [place] (fax [phone]) regarding my need for medical leave of absence for May 26- May 30.   My information with Shawna Orleans is:   [name]  [place] [address]  Claim number : [id number]   Your help is very much appreciated!!!   [name]

## 2013-07-14 ENCOUNTER — Ambulatory Visit
Admit: 2013-07-14 | Discharge: 2013-07-14 | Disposition: A | Discharge status: Home / Self Care | Payer: 59 | Attending: Radiology | Admitting: Radiology

## 2013-07-14 VITALS — BP 126/81 | HR 73 | Temp 98.0°F | Resp 14

## 2013-07-14 DIAGNOSIS — I2699 Other pulmonary embolism without acute cor pulmonale: Secondary | ICD-10-CM

## 2013-07-14 DIAGNOSIS — I50810 Right heart failure, unspecified: Secondary | ICD-10-CM

## 2013-07-24 ENCOUNTER — Encounter: Payer: Self-pay | Admitting: Pulmonary Disease

## 2013-07-24 ENCOUNTER — Other Ambulatory Visit: Payer: Self-pay | Admitting: Vascular Surgery

## 2013-07-24 ENCOUNTER — Ambulatory Visit (HOSPITAL_COMMUNITY)
Admission: RE | Admit: 2013-07-24 | Discharge: 2013-07-24 | Disposition: A | Discharge status: Home / Self Care | Payer: 59 | Source: Ambulatory Visit | Attending: Vascular Surgery | Admitting: Vascular Surgery

## 2013-07-24 DIAGNOSIS — M79604 Pain in right leg: Secondary | ICD-10-CM

## 2013-07-24 DIAGNOSIS — M79609 Pain in unspecified limb: Secondary | ICD-10-CM | POA: Insufficient documentation

## 2013-08-13 ENCOUNTER — Ambulatory Visit (INDEPENDENT_AMBULATORY_CARE_PROVIDER_SITE_OTHER): Payer: 59 | Admitting: Pulmonary Disease

## 2013-08-13 ENCOUNTER — Encounter: Payer: Self-pay | Admitting: Pulmonary Disease

## 2013-08-13 VITALS — BP 124/76 | HR 72 | Temp 97.4°F | Ht 66.0 in | Wt 267.4 lb

## 2013-08-13 DIAGNOSIS — Q211 Atrial septal defect, unspecified: Secondary | ICD-10-CM

## 2013-08-13 DIAGNOSIS — I2699 Other pulmonary embolism without acute cor pulmonale: Secondary | ICD-10-CM

## 2013-08-13 DIAGNOSIS — Q2111 Secundum atrial septal defect: Secondary | ICD-10-CM

## 2013-08-13 DIAGNOSIS — R0600 Dyspnea, unspecified: Secondary | ICD-10-CM

## 2013-08-13 DIAGNOSIS — R0609 Other forms of dyspnea: Secondary | ICD-10-CM

## 2013-08-13 DIAGNOSIS — R0989 Other specified symptoms and signs involving the circulatory and respiratory systems: Secondary | ICD-10-CM

## 2013-08-13 NOTE — Assessment & Plan Note (Signed)
She is to continue xarelto.  No obvious cause for this.  Will continue anti-coagulation for at least 6 months, and then repeat hypercoagulable panel.  Will then need to decide if she should remain on anti-coagulation indefinitely.  Currently her risk for bleeding is low.

## 2013-08-13 NOTE — Progress Notes (Signed)
Chief Complaint  Patient presents with  . Follow-up    Pt c.o SOB with exertion. Pt states that she is not 100% improved. Denies cough, wheezing, CP/tightness    History of Present Illness: [name] is a 53 y.o. female with PE dx in May 2015.  She continues to have trouble with exertion with activity, and can't do exercise any more.  She occasionally gets chest pain, but this is better than before.  She will sometimes feel short of breath when walking.    She denies fever, cough, sputum, or wheeze.  She has chronic Lt leg swelling from varicose veins.  TESTS: CT angiogram chest 06/11/13 >> saddle PE Doppler legs 06/11/13 >> acute DVT Rt posterior tibial vein  Echo 06/11/13 >> EF 55 to 60%, paradoxical septum motion, mod RA/RV dilation, ?medium sized secundum ASD, PAS 41 mmHg Catheter directed thrombolysis 06/12/13 >> by IR Hypercoagulable panel 06/12/13 >> Anticardiolipin IgG/IgM/IgA negative, Prothrombin gene mutation negative, Factor 5 leiden negative, Homocysteine 6.1, Beta 2 glycoprotein negative, lupus anticoagulant negative, Protein C negative, Protein S negative, Antithrombin III negative Echo 06/22/13 >> no ASD  [name]  has a past medical history of Leg swelling (03-24-13); Difficulty in swallowing (03-24-13); Arthritis; Bruises easily; Reflux; Shortness of breath; Blood transfusion; GERD (gastroesophageal reflux disease); Peripheral vascular disease; Anemia; Diabetes mellitus; Bleeding ulcer (12-2010  hospitalized for 1 week); Constipation; Vomiting (03-24-13); Blood transfusion without reported diagnosis; and Hyperlipidemia.  [name]  has past surgical history that includes Laparoscopic gastric banding (01-05-2008); Endovenous ablation saphenous vein w/ laser (06-2008); SALPHINGOOOPHORECTOMY (Right); WEDGE RESECTION OF OVARY (LEFT OVARY ); CARPEL TUNNEL BILATEREAL; Diagnostic laparoscopy; Appendectomy; Laparoscopic gastric banding (12/18/2010);  Esophagogastroduodenoscopy (12/18/2010); Tonsillectomy; Cholecystectomy; and Colonoscopy (N/A, 04/10/2013).  Prior to Admission medications   Medication Sig Start Date End Date Taking? Authorizing Provider  Calcium Carb-Cholecalciferol (CALCIUM 1000 + D PO) Take 1,000 mg by mouth daily.     Yes Historical Provider, MD  furosemide (LASIX) 20 MG tablet Take 20 mg by mouth daily.   Yes Historical Provider, MD  Linaclotide (LINZESS) 145 MCG CAPS capsule Take 145 mcg by mouth daily as needed (for IBS pain).   Yes Historical Provider, MD  Multiple Vitamins-Minerals (MULTIVITAMINS THER. W/MINERALS) TABS Take 1 tablet by mouth daily.     Yes Historical Provider, MD  omeprazole (PRILOSEC) 20 MG capsule Take 20 mg by mouth 2 (two) times daily before a meal.   Yes Historical Provider, MD  rivaroxaban (XARELTO) 20 MG TABS tablet Take 1 tablet (20 mg total) by mouth daily with supper. 06/15/13  Yes Bobby Rumpf [place], PA-C    Allergies  Allergen Reactions  . Ancef [Cefazolin Sodium] Nausea And Vomiting  . Contrast Media [Iodinated Diagnostic Agents]     apnea  . Penicillins Other (See Comments)    Patient does not recall the reaction, it happened when she was a child     Physical Exam:  General - No distress ENT - No sinus tenderness, no oral exudate, no LAN Cardiac - s1s2 regular, no murmur Chest - No wheeze/rales/dullness Back - No focal tenderness Abd - Soft, non-tender Ext - Lt leg edema Neuro - Normal strength Skin - No rashes Psych - normal mood, and behavior   Assessment/Plan:  Chesley Mires, MD [facility] Pager:  [phone]

## 2013-08-13 NOTE — Patient Instructions (Signed)
Will arrange for V/Q scan Will arrange for Cardiology appointment Will check ambulatory oximetry on room air before leaving today Follow up in 8 weeks

## 2013-08-13 NOTE — Assessment & Plan Note (Signed)
She was noted to have possible ASD during acute PE, that was not evident on f/u Echo.  She continues to have dyspnea on exertion.  Will arrange for cardiology evaluation to assess for ASD and assess for cardiac cause of her dyspnea.

## 2013-08-13 NOTE — Assessment & Plan Note (Signed)
Will arrange for CXR and V/Q scan to further assess.

## 2013-08-18 ENCOUNTER — Ambulatory Visit (HOSPITAL_COMMUNITY)
Admission: RE | Admit: 2013-08-18 | Discharge: 2013-08-18 | Disposition: A | Discharge status: Home / Self Care | Payer: 59 | Source: Ambulatory Visit | Attending: Pulmonary Disease | Admitting: Pulmonary Disease

## 2013-08-18 ENCOUNTER — Telehealth: Payer: Self-pay | Admitting: Pulmonary Disease

## 2013-08-18 ENCOUNTER — Encounter (HOSPITAL_COMMUNITY)
Admission: RE | Admit: 2013-08-18 | Discharge: 2013-08-18 | Disposition: A | Discharge status: Home / Self Care | Payer: 59 | Source: Ambulatory Visit | Attending: Pulmonary Disease | Admitting: Pulmonary Disease

## 2013-08-18 DIAGNOSIS — Q211 Atrial septal defect, unspecified: Secondary | ICD-10-CM

## 2013-08-18 DIAGNOSIS — E119 Type 2 diabetes mellitus without complications: Secondary | ICD-10-CM | POA: Insufficient documentation

## 2013-08-18 DIAGNOSIS — I2699 Other pulmonary embolism without acute cor pulmonale: Secondary | ICD-10-CM | POA: Insufficient documentation

## 2013-08-18 DIAGNOSIS — Z86711 Personal history of pulmonary embolism: Secondary | ICD-10-CM | POA: Insufficient documentation

## 2013-08-18 DIAGNOSIS — R0602 Shortness of breath: Secondary | ICD-10-CM | POA: Insufficient documentation

## 2013-08-18 DIAGNOSIS — Q2111 Secundum atrial septal defect: Secondary | ICD-10-CM | POA: Insufficient documentation

## 2013-08-18 MED ORDER — TECHNETIUM TO 99M ALBUMIN AGGREGATED
6.0000 | Freq: Once | INTRAVENOUS | Status: AC | PRN
  Administered 2013-08-18: 6 via INTRAVENOUS

## 2013-08-18 MED ORDER — TECHNETIUM TC 99M DIETHYLENETRIAME-PENTAACETIC ACID: 40.0000 | Freq: Once | INTRAVENOUS | Status: AC | PRN

## 2013-08-18 NOTE — Telephone Encounter (Signed)
Dg Chest 2 View  08/18/2013   CLINICAL DATA:  Pre V/Q scan. Shortness of breath. Recent pulmonary embolism. Diabetic.  EXAM: CHEST  2 VIEW  COMPARISON:  06/17/2013  FINDINGS: Moderate thoracic spondylosis. Mild convex right thoracolumbar spine curvature. Midline trachea. Normal heart size and mediastinal contours. No pleural effusion or pneumothorax. Clear lungs. Cholecystectomy clips.  IMPRESSION: No acute cardiopulmonary disease.   Electronically Signed   By: Abigail Miyamoto M.D.   On: 08/18/2013 08:40   Nm Pulmonary Per & Vent  08/18/2013   CLINICAL DATA:  History pulmonary embolism with persistent dyspnea.  EXAM: NUCLEAR MEDICINE VENTILATION - PERFUSION LUNG SCAN  TECHNIQUE: Ventilation images were obtained in multiple projections using inhaled aerosol technetium 99 M DTPA. Perfusion images were obtained in multiple projections after intravenous injection of Tc-52m MAA.  RADIOPHARMACEUTICALS:  40.0 mCi Tc-57m DTPA aerosol and 6.0 mCi Tc-31m MAA  COMPARISON:  Chest x-ray 08/18/2013 and chest CT 06/14/2013  FINDINGS: Ventilation: No focal ventilation defect.  Perfusion: No wedge shaped peripheral perfusion defects to suggest acute pulmonary embolism.  IMPRESSION: Normal ventilation perfusion lung scan.   Electronically Signed   By: Marin Olp M.D.   On: 08/18/2013 10:10    Will have my nurse inform pt that chest xray was normal, and lung perfusion can was normal also.  Will await her evaluation by cardiology.  No change to current treatment plan.

## 2013-08-18 NOTE — Telephone Encounter (Signed)
LMOM x 1 

## 2013-08-20 NOTE — Telephone Encounter (Signed)
I spoke with patient about results and she verbalized understanding and had no questions 

## 2013-09-04 ENCOUNTER — Ambulatory Visit (INDEPENDENT_AMBULATORY_CARE_PROVIDER_SITE_OTHER)
Admission: RE | Admit: 2013-09-04 | Discharge: 2013-09-04 | Disposition: A | Discharge status: Home / Self Care | Payer: 59 | Source: Ambulatory Visit | Attending: Pulmonary Disease | Admitting: Pulmonary Disease

## 2013-09-04 ENCOUNTER — Other Ambulatory Visit (INDEPENDENT_AMBULATORY_CARE_PROVIDER_SITE_OTHER): Payer: 59

## 2013-09-04 ENCOUNTER — Ambulatory Visit (INDEPENDENT_AMBULATORY_CARE_PROVIDER_SITE_OTHER): Payer: 59 | Admitting: Pulmonary Disease

## 2013-09-04 ENCOUNTER — Encounter: Payer: Self-pay | Admitting: Pulmonary Disease

## 2013-09-04 ENCOUNTER — Encounter: Payer: Self-pay | Admitting: Adult Health

## 2013-09-04 VITALS — BP 134/70 | HR 105 | Temp 102.3°F | Ht 66.0 in | Wt 262.0 lb

## 2013-09-04 DIAGNOSIS — R0609 Other forms of dyspnea: Secondary | ICD-10-CM

## 2013-09-04 DIAGNOSIS — R509 Fever, unspecified: Secondary | ICD-10-CM

## 2013-09-04 DIAGNOSIS — R0600 Dyspnea, unspecified: Secondary | ICD-10-CM

## 2013-09-04 DIAGNOSIS — R0989 Other specified symptoms and signs involving the circulatory and respiratory systems: Secondary | ICD-10-CM

## 2013-09-04 LAB — CBC WITH DIFFERENTIAL/PLATELET
BASOS ABS: 0 10*3/uL (ref 0.0–0.1)
Basophils Relative: 0.1 % (ref 0.0–3.0)
Eosinophils Absolute: 0 10*3/uL (ref 0.0–0.7)
Eosinophils Relative: 0.7 % (ref 0.0–5.0)
HCT: 43.8 % (ref 36.0–46.0)
Hemoglobin: 14.6 g/dL (ref 12.0–15.0)
Lymphocytes Relative: 9.8 % — ABNORMAL LOW (ref 12.0–46.0)
Lymphs Abs: 0.7 10*3/uL (ref 0.7–4.0)
MCHC: 33.5 g/dL (ref 30.0–36.0)
MCV: 84.6 fl (ref 78.0–100.0)
MONO ABS: 0.2 10*3/uL (ref 0.1–1.0)
MONOS PCT: 2.9 % — AB (ref 3.0–12.0)
Neutro Abs: 6.2 10*3/uL (ref 1.4–7.7)
PLATELETS: 165 10*3/uL (ref 150.0–400.0)
RBC: 5.18 Mil/uL — ABNORMAL HIGH (ref 3.87–5.11)
RDW: 14.1 % (ref 11.5–15.5)
WBC: 7.2 10*3/uL (ref 4.0–10.5)

## 2013-09-04 LAB — COMPREHENSIVE METABOLIC PANEL
ALBUMIN: 3.9 g/dL (ref 3.5–5.2)
ALK PHOS: 65 U/L (ref 39–117)
ALT: 16 U/L (ref 0–35)
AST: 17 U/L (ref 0–37)
BUN: 14 mg/dL (ref 6–23)
CO2: 26 meq/L (ref 19–32)
Calcium: 9.1 mg/dL (ref 8.4–10.5)
Chloride: 104 mEq/L (ref 96–112)
Creatinine, Ser: 0.6 mg/dL (ref 0.4–1.2)
GFR: 102.62 mL/min (ref >60.00)
GLUCOSE: 88 mg/dL (ref 70–99)
POTASSIUM: 3.8 meq/L (ref 3.5–5.1)
SODIUM: 138 meq/L (ref 135–145)
TOTAL PROTEIN: 7.3 g/dL (ref 6.0–8.3)
Total Bilirubin: 0.6 mg/dL (ref 0.2–1.2)

## 2013-09-04 LAB — URINALYSIS
Bilirubin Urine: NEGATIVE
HGB URINE DIPSTICK: NEGATIVE
Ketones, ur: NEGATIVE
Leukocytes, UA: NEGATIVE
NITRITE: NEGATIVE
Specific Gravity, Urine: 1.015 (ref 1.000–1.030)
Total Protein, Urine: NEGATIVE
Urine Glucose: NEGATIVE
Urobilinogen, UA: 1 (ref 0.0–1.0)
pH: 7.5 (ref 5.0–8.0)

## 2013-09-04 NOTE — Patient Instructions (Signed)
Blood work today Urinalysis CXR today Stay on xarelto I will call you with results or if antibiotic required [facility] of oral fluids, tylenol as needed Call if worse

## 2013-09-04 NOTE — Assessment & Plan Note (Signed)
-  unlikely repeat event , whil eon xarelto Also note recent neg VQ DUplex -resolved DVT in 07/2013 Doubt D-dimer helpful in this setting

## 2013-09-04 NOTE — Assessment & Plan Note (Addendum)
CBC, CMET today Urinalysis CXR today Stay on xarelto I will call you with results or if antibiotic required Treat as viral syndrome -Plenty of oral fluids, tylenol as needed Call if worse or new symptoms

## 2013-09-04 NOTE — Progress Notes (Signed)
   Subjective:    Patient ID: [name], female    DOB: 1960/06/28, 53 y.o.   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   Chief Complaint  Patient presents with  . Acute Visit    VS PT. pt reports this AM she felt very SOB, chills, feels feverish but temp has been 97.5, weak, muscle and bone aches. no wheezing. no chest tx    She was seen 3 weeks ago. CXR wnl, VQ nml Cardiac eval suggested  She had improved andwas going back to exercise class.She feels like she is  getting the flu, and was very short of breath this morning. She put herself on Oxygen for a short time at the hospital and checked her oxygen saturation (97%). Her husband was sick with a stomach virus, no other sick contacts. She had a temperature of 102. Denies burning micturition,She denies  cough, sputum, or wheeze. She has chronic Lt leg swelling from varicose veins.    TESTS:  CT angiogram chest 06/11/13 >> saddle PE  Doppler legs 06/11/13 >> acute DVT Rt posterior tibial vein  Echo 06/11/13 >> EF 55 to 60%, paradoxical septum motion, mod RA/RV dilation, ?medium sized secundum ASD, PAS 41 mmHg  Catheter directed thrombolysis 06/12/13 >> by IR  Hypercoagulable panel 06/12/13 >> Anticardiolipin IgG/IgM/IgA negative, Prothrombin gene mutation negative, Factor 5 leiden negative, Homocysteine 6.1, Beta 2 glycoprotein negative, lupus anticoagulant negative, Protein C negative, Protein S negative, Antithrombin III negative  Echo 06/22/13 >> no ASD    Review of Systems neg for any significant sore throat, dysphagia, itching, sneezing, nasal congestion or excess/ purulent secretions, fever, chills, sweats, unintended wt loss, pleuritic or exertional cp, hempoptysis, orthopnea pnd or change in chronic leg swelling. Also denies presyncope, palpitations, heartburn, abdominal pain, nausea, vomiting, diarrhea or change in bowel or urinary habits, dysuria,hematuria, rash, arthralgias,  visual complaints, headache, numbness weakness or ataxia.     Objective:   Physical Exam  Gen. Pleasant, obese, in mild distress, normal affect ENT - no lesions, no post nasal drip Neck: No JVD, no thyromegaly, no carotid bruits Lungs: no use of accessory muscles, no dullness to percussion, coarse without rales or rhonchi  Cardiovascular: Rhythm regular, heart sounds  normal, no murmurs or gallops, no peripheral edema Abdomen: soft and non-tender, no hepatosplenomegaly, BS normal. Musculoskeletal: No deformities, no cyanosis or clubbing Neuro:  alert, non focal       Assessment & Plan:

## 2013-09-04 NOTE — Assessment & Plan Note (Signed)
-  mild hypoxia, unclear cause, doubt recurrent PE Offered admission, she prefers to [place] testing & see how symptoms evolve

## 2013-09-07 LAB — CULTURE, BLOOD (SINGLE)

## 2013-09-08 ENCOUNTER — Encounter: Payer: Self-pay | Admitting: Adult Health

## 2013-09-08 ENCOUNTER — Ambulatory Visit (INDEPENDENT_AMBULATORY_CARE_PROVIDER_SITE_OTHER): Payer: 59 | Admitting: Adult Health

## 2013-09-08 VITALS — BP 124/70 | HR 84 | Temp 98.0°F | Ht 66.0 in | Wt 254.0 lb

## 2013-09-08 DIAGNOSIS — K5289 Other specified noninfective gastroenteritis and colitis: Secondary | ICD-10-CM

## 2013-09-08 DIAGNOSIS — K529 Noninfective gastroenteritis and colitis, unspecified: Secondary | ICD-10-CM | POA: Insufficient documentation

## 2013-09-08 MED ORDER — ONDANSETRON HCL 4 MG PO TABS: 4.0000 mg | ORAL_TABLET | Freq: Three times a day (TID) | ORAL | Status: DC | PRN

## 2013-09-08 NOTE — Progress Notes (Signed)
Reviewed & agree with plan  

## 2013-09-08 NOTE — Patient Instructions (Signed)
Zofran 4mg  every 8hrs as needed for nausea and vomitting.  Advance clear liquid diet today , to a bland diet.  Avoid lactose for next 5 days Push fluids , gatorade, propel.  Continue to hold Lasix until return back to normal diet.  If not improving will need to see Primary Doctor.  Please contact office for sooner follow up if symptoms do not improve or worsen or seek emergency care

## 2013-09-08 NOTE — Progress Notes (Signed)
   Subjective:    Patient ID: [name], female    DOB: September 03, 1960, 53 y.o.   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   09/04/13  Chief Complaint  Patient presents with  . Acute Visit    VS PT. pt reports this AM she felt very SOB, chills, feels feverish but temp has been 97.5, weak, muscle and bone aches. no wheezing. no chest tx   She was seen 3 weeks ago. CXR wnl, VQ nml Cardiac eval suggested  She had improved andwas going back to exercise class.She feels like she is  getting the flu, and was very short of breath this morning. She put herself on Oxygen for a short time at the hospital and checked her oxygen saturation (97%). Her husband was sick with a stomach virus, no other sick contacts. She had a temperature of 102. Denies burning micturition,She denies  cough, sputum, or wheeze. She has chronic Lt leg swelling from varicose veins.  >>labs and cxr   09/08/2013 Follow up  Pt presented with gastroenteritis 09/04/13 w/ fever, body aches . Labs done were unrevealing w/ nml wbc , clear UA and nml electrolyte panel and LFT. No sign of dehydration.  Recommended to hydrate . N/V/D  began on 8/1 , this has slowed down w/ last episode yesterday.  Is able to drink fluids well, ginger ale , tea.  Fevers are trending down, still has low grade fever. Taking tylenol.  Husband had similar episode couple of weeks ago, now recovered.  Lasix on hold for now. Not taking Linzess. No bloody stools, no abd pain, recent travel or abx use.      TESTS:  CT angiogram chest 06/11/13 >> saddle PE  Doppler legs 06/11/13 >> acute DVT Rt posterior tibial vein  Echo 06/11/13 >> EF 55 to 60%, paradoxical septum motion, mod RA/RV dilation, ?medium sized secundum ASD, PAS 41 mmHg  Catheter directed thrombolysis 06/12/13 >> by IR  Hypercoagulable panel 06/12/13 >> Anticardiolipin IgG/IgM/IgA negative, Prothrombin gene mutation negative, Factor 5 leiden negative,  Homocysteine 6.1, Beta 2 glycoprotein negative, lupus anticoagulant negative, Protein C negative, Protein S negative, Antithrombin III negative  Echo 06/22/13 >> no ASD    Review of Systems  neg for any significant sore throat, dysphagia, itching, sneezing, nasal congestion or excess/ purulent secretions, fever, chills, sweats, unintended wt loss, pleuritic or exertional cp, hempoptysis, orthopnea pnd or change in chronic leg swelling. Also denies presyncope, palpitations, heartburn, abdominal pain, nausea, vomiting, diarrhea or change in bowel or urinary habits, dysuria,hematuria, rash, arthralgias, visual complaints, headache, numbness weakness or ataxia.     Objective:   Physical Exam   Gen. Pleasant, obese, in mild distress, normal affect ENT - no lesions, no post nasal drip Neck: No JVD, no thyromegaly, no carotid bruits Lungs: no use of accessory muscles, no dullness to percussion, coarse without rales or rhonchi  Cardiovascular: Rhythm regular, heart sounds  normal, no murmurs or gallops, no peripheral edema Abdomen: soft and non-tender, no hepatosplenomegaly, BS normal, no guarding or rebound. Neg cva tenderness  Musculoskeletal: No deformities, no cyanosis or clubbing Neuro:  alert, non focal       Assessment & Plan:

## 2013-09-08 NOTE — Assessment & Plan Note (Signed)
Acute Gastroenteritis -slowly resolving  Advised to advance diet as tolerated and push fluids  If GI symptoms persist will need to follow up with PCP    Plan  Zofran 4mg  every 8hrs as needed for nausea and vomitting.  Advance clear liquid diet today , to a bland diet.  Avoid lactose for next 5 days Push fluids , gatorade, propel.  Continue to hold Lasix until return back to normal diet.  If not improving will need to see Primary Doctor.  Please contact office for sooner follow up if symptoms do not improve or worsen or seek emergency care

## 2013-09-23 ENCOUNTER — Ambulatory Visit: Payer: 59 | Admitting: Cardiology

## 2013-09-30 ENCOUNTER — Encounter: Payer: Self-pay | Admitting: Cardiology

## 2013-09-30 ENCOUNTER — Ambulatory Visit (INDEPENDENT_AMBULATORY_CARE_PROVIDER_SITE_OTHER): Payer: 59 | Admitting: Cardiology

## 2013-09-30 ENCOUNTER — Ambulatory Visit: Payer: 59 | Admitting: Cardiology

## 2013-09-30 VITALS — BP 116/80 | HR 74 | Ht 66.0 in | Wt 263.0 lb

## 2013-09-30 DIAGNOSIS — I2609 Other pulmonary embolism with acute cor pulmonale: Secondary | ICD-10-CM

## 2013-09-30 DIAGNOSIS — I2602 Saddle embolus of pulmonary artery with acute cor pulmonale: Secondary | ICD-10-CM

## 2013-09-30 DIAGNOSIS — I2692 Saddle embolus of pulmonary artery without acute cor pulmonale: Secondary | ICD-10-CM

## 2013-09-30 NOTE — Progress Notes (Signed)
[name] Date of Birth:  Jun 05, 1960 [place] [address] 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 Halford Chessman.  The patient has an interesting cardiopulmonary history.  She was in her usual state of health until late April 2015 when she began to have pulmonary congestion symptoms and thought that she had the flu.  She saw her PCP in [place] several times initially her oxygen saturation at the first visit was 96% and the second visit was 92%.  She continued to worsen and collapsed at home.  She was brought to [facility] where in the emergency room her d-dimer was 13 and she underwent a CT angiogram which showed massive pulmonary embolus with saddle embolus.  She had slight elevation of her troponins consistent with pulmonary embolus.  Her initial echocardiogram on 06/11/13 showed a moderate hypertension with a pulmonary artery pressure of 41.  The ventricular septum showed paradoxic motion consistent with right ventricular volume overload.  There was a probable medium sized ostium secundum ASD present.  The right atrium was markedly dilated.  The patient went on to be treated with transcatheter ultrasound assisted thrombolytic infusion therapy for her saddle embolus.  She had a good clinical response.  She had a followup echocardiogram on 06/22/13 which showed that her pulmonary artery pressure had returned to normal at 16.  The right atrial size was normal.  The right ventricular size was normal.  There was no evidence of ASD or PFO. The patient was evaluated for hypercoagulable state as the cause of her pulmonary embolus and her DVT of her right leg.  However the hypercoagulability workup was negative.  The patient does not have any family history of abnormal blood clotting. The patient herself has no prior cardiac history.  She does have a past history  of morbid obesity and has had lap band surgery by Dr. Johnathan Hausen.  The patient initially weighed about 388 pounds.  Since her surgery she has lost approximately 130 pounds. She still is experiencing mild exertional dyspnea.  She denies any chest discomfort.  She has not been aware of any racing of her heart.  Current Outpatient Prescriptions  Medication Sig Dispense Refill  . Calcium Carb-Cholecalciferol (CALCIUM 1000 + D PO) Take 1,000 mg by mouth daily.        . Linaclotide (LINZESS) 145 MCG CAPS capsule Take 145 mcg by mouth daily as needed (for IBS pain).      . Multiple Vitamins-Minerals (MULTIVITAMINS THER. W/MINERALS) TABS Take 1 tablet by mouth daily.        Marland Kitchen omeprazole (PRILOSEC) 20 MG capsule Take 20 mg by mouth 2 (two) times daily before a meal.      . rivaroxaban (XARELTO) 20 MG TABS tablet Take 1 tablet (20 mg total) by mouth daily with supper.  30 tablet  3   No current facility-administered medications for this visit.    Allergies  Allergen Reactions  . Ancef [Cefazolin Sodium] Nausea And Vomiting  . Contrast Media [Iodinated Diagnostic Agents]     apnea  . Penicillins Other (See Comments)    Patient does not recall the reaction, it happened when she was a child    Patient Active Problem List   Diagnosis Date Noted  . Gastroenteritis, acute 09/08/2013  . Fever, unspecified 09/04/2013  . ASD (  atrial septal defect) 08/13/2013  . Pulmonary embolism 06/22/2013  . Dyspnea 06/11/2013  . Anterior slip repaired Oct 2012 01/18/2011  . GI bleed 12/18/2010  . Diabetes mellitus 12/18/2010  . Varicose veins of lower extremities with other complications 24/23/5361  . Diabetes mellitus   . HIP PAIN, RIGHT 03/06/2010    History  Smoking status  . Never Smoker   Smokeless tobacco  . Never Used    History  Alcohol Use No    Family History  Problem Relation Age of Onset  . Heart disease Father   . Kidney disease Father   . Peripheral vascular disease Father   .  Heart attack Father     77  . Diabetes Mother   . Arthritis Mother     Review of Systems: Constitutional: no fever chills diaphoresis or fatigue or change in weight.  Head and neck: no hearing loss, no epistaxis, no photophobia or visual disturbance. Respiratory: No cough, shortness of breath or wheezing. Cardiovascular: No chest pain peripheral edema, palpitations. Gastrointestinal: No abdominal distention, no abdominal pain, no change in bowel habits hematochezia or melena. Genitourinary: No dysuria, no frequency, no urgency, no nocturia. Musculoskeletal:No arthralgias, no back pain, no gait disturbance or myalgias. Neurological: No dizziness, no headaches, no numbness, no seizures, no syncope, no weakness, no tremors. Hematologic: No lymphadenopathy, no easy bruising. Psychiatric: No confusion, no hallucinations, no sleep disturbance.    Physical Exam: Filed Vitals:   09/30/13 1543  BP: 116/80  Pulse: 74   general appearance reveals a large middle-aged woman in no acute distress.   Head and neck exam reveals that the pupils are equal and reactive.  The extraocular movements are full.  There is no scleral icterus.  Mouth and pharynx are benign.  No lymphadenopathy.  No carotid bruits.  The jugular venous pressure is normal.  Thyroid is not enlarged or tender.  Chest is clear to percussion and auscultation.  No rales or rhonchi.  Expansion of the chest is symmetrical.  Heart reveals no abnormal lift or heave.  First and second heart sounds are normal.  There is no murmur gallop rub or click.  The abdomen is obese, soft and nontender.  Bowel sounds are normoactive.  There is no hepatosplenomegaly or mass.  There are no abdominal bruits.  Extremities reveal no phlebitis or edema.  Pedal pulses are good.  There is no cyanosis or clubbing.  Neurologic exam is normal strength and no lateralizing weakness.  No sensory deficits.  Integument reveals no rash  EKG shows normal sinus  rhythm and no ischemic changes.  Since 06/11/13, her sinus tachycardia has resolved and her nonspecific T wave changes have resolved.  Assessment / Plan: 1. history of massive pulmonary embolus with saddle embolus in May 2015 2. history of DVT of right lower extremity preceding her pulmonary embolus 3. transient right heart dilatation and strain secondary to acute right heart strain secondary to pulmonary embolus with transient small biventricular atrial septal defect noted on initial echocardiogram.  With restoration of normal right heart pressures, atrial septal defect is no longer seen. 4. morbid obesity  Recommendation: Continue long-term Xarelto.  Continue efforts at ongoing weight loss. Recheck here when necessary

## 2013-09-30 NOTE — Patient Instructions (Signed)
Your physician recommends that you continue on your current medications as directed. Please refer to the Current Medication list given to you today.  Follow up as needed  

## 2013-10-02 ENCOUNTER — Ambulatory Visit: Payer: 59 | Admitting: Pulmonary Disease

## 2013-10-14 ENCOUNTER — Encounter (INDEPENDENT_AMBULATORY_CARE_PROVIDER_SITE_OTHER): Payer: Self-pay | Admitting: Physician Assistant

## 2013-10-16 ENCOUNTER — Telehealth: Payer: Self-pay

## 2013-10-16 NOTE — Telephone Encounter (Signed)
Phone call from pt.  C/o "gross edema, prominent veins, and heaviness" of Left LE.  Requested an appt. with Dr. Donnetta Hutching.  Related hx of (L) GSV laser ablation x 2 in past.  Also stated she had a left LE venous study in the recent past.  Advised will return call to pt. with an appt.  Agrees with plan.

## 2013-10-16 NOTE — Telephone Encounter (Signed)
Left message for pt with OV only appt, dpm

## 2013-10-20 ENCOUNTER — Telehealth: Payer: Self-pay | Admitting: Vascular Surgery

## 2013-10-20 ENCOUNTER — Other Ambulatory Visit: Payer: Self-pay

## 2013-10-20 DIAGNOSIS — R29898 Other symptoms and signs involving the musculoskeletal system: Secondary | ICD-10-CM

## 2013-10-20 DIAGNOSIS — I8390 Asymptomatic varicose veins of unspecified lower extremity: Secondary | ICD-10-CM

## 2013-10-20 DIAGNOSIS — R600 Localized edema: Secondary | ICD-10-CM

## 2013-10-20 NOTE — Telephone Encounter (Signed)
Message copied by Gena Fray on Tue Oct 20, 2013 11:23 AM ------      Message from: Denman George      Created: Tue Oct 20, 2013  9:38 AM      Regarding: needs vascular study 9/22       Per Dr. Donnetta Hutching, pt. Needs a bilat LE reflux study with appt. 9/22.  (I will place the order) ------

## 2013-10-20 NOTE — Telephone Encounter (Addendum)
Left messages on home and cell to cb to schedule ultrasound. We have openings W-Fr of this week, dpm  10/21/13 @ 10:14am: spoke with pt to schedule, dpm

## 2013-10-22 ENCOUNTER — Ambulatory Visit (HOSPITAL_COMMUNITY)
Admission: RE | Admit: 2013-10-22 | Discharge: 2013-10-22 | Disposition: A | Discharge status: Home / Self Care | Payer: 59 | Source: Ambulatory Visit | Attending: Vascular Surgery | Admitting: Vascular Surgery

## 2013-10-22 DIAGNOSIS — R29898 Other symptoms and signs involving the musculoskeletal system: Secondary | ICD-10-CM | POA: Insufficient documentation

## 2013-10-22 DIAGNOSIS — I8390 Asymptomatic varicose veins of unspecified lower extremity: Secondary | ICD-10-CM | POA: Insufficient documentation

## 2013-10-22 DIAGNOSIS — R600 Localized edema: Secondary | ICD-10-CM

## 2013-10-22 DIAGNOSIS — R609 Edema, unspecified: Secondary | ICD-10-CM | POA: Insufficient documentation

## 2013-10-26 ENCOUNTER — Encounter: Payer: Self-pay | Admitting: Vascular Surgery

## 2013-10-27 ENCOUNTER — Ambulatory Visit (INDEPENDENT_AMBULATORY_CARE_PROVIDER_SITE_OTHER): Payer: 59 | Admitting: Vascular Surgery

## 2013-10-27 ENCOUNTER — Encounter: Payer: Self-pay | Admitting: Vascular Surgery

## 2013-10-27 VITALS — BP 142/94 | HR 82 | Resp 18 | Ht 66.0 in | Wt 265.9 lb

## 2013-10-27 DIAGNOSIS — M79609 Pain in unspecified limb: Secondary | ICD-10-CM

## 2013-10-27 DIAGNOSIS — M7989 Other specified soft tissue disorders: Secondary | ICD-10-CM

## 2013-10-27 DIAGNOSIS — M79662 Pain in left lower leg: Secondary | ICD-10-CM

## 2013-10-27 DIAGNOSIS — I83893 Varicose veins of bilateral lower extremities with other complications: Secondary | ICD-10-CM

## 2013-10-27 DIAGNOSIS — M79605 Pain in left leg: Secondary | ICD-10-CM | POA: Insufficient documentation

## 2013-10-27 NOTE — Progress Notes (Signed)
Problems with Activities of Daily Living Secondary to Leg Pain  1. 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  This is very difficult due to leg pain and swelling.    2. [name] states she has great difficulty sleeping due to leg pain.        Failure of  Conservative Therapy:  1. Worn 20-30 mm Hg thigh high compression hose >3 months with no relief of symptoms.  2. Frequently elevates legs-no relief of symptoms  3. Taken Ibuprofen 600 Mg TID with no relief of symptoms.  Very complex patient. Has a history of venous hypertension. Underwent left great saphenous vein laser ablation by myself in 2010. Initial followup showed closure of her pain. She had recurrent symptoms in 2012 had repeat imaging which showed partial recanalization and she underwent repeat laser ablation at that time. She recently has had a recurrence of symptoms. She reports pain with prolonged standing which he does quite a Artist. She also has a varicosities of her pretibial region on the left reports pain specifically over these with prolonged standing.  In May of 2015 she had an episode of some fatigue and shortness of breath. Further evaluation revealed major pulmonary embolus. She had a saddle embolus and had successful treatment with thrombolysis in the intensive care unit. She is onXaralto and has been told that this will be required lifelong.  On physical exam she does have swelling in her left leg greater than her right leg. Does have her superficial varices her pretibial area on the left which are tender to her.  She underwent a repeat venous duplex on 10/22/2013. We reviewed this with her. This shows no evidence of significant superficial reflux in the right leg. She does have some reflux in the common femoral vein only. On the left leg she does have some evidence of reflux in the common femoral and femoral vein. She does have recanalization with reflux in her great saphenous vein from the  thigh. There is an area of successful occlusion in the mid distal thigh and then recanalization. Curvature branches again at the level above knee extending down to her calf.  Impression and plan: Recurrent venous hypertension with a deep venous reflux and significant great saphenous vein reflux in her left leg. She has had 2 prior attempts at ablation of her saphenous vein.  I discussed options to include observation only versus treatment. I discussed treatment options to include repeated ablation of her great saphenous vein in the calf and thigh versus surgical vein stripping.  Quite concerned regarding her major pulmonary embolus. Explained I feel that her risk is less with repeat ablation despite the fact that she has had failure on 2 prior occasions. Concern regarding general anesthesia and surgical treatment with prolonged return to regular and Lipitor status. She is not comfortable with observation only. She reports this is intolerable. We can improve her symptoms reduction of her superficial venous hypertension. She has been extremely compliant with her compression garments despite this has so severe pain which is limiting her ability to work. We'll proceed as soon as possible for left leg ablation. Does understand the potential risk for DVT. We will stop her Thomes Dinning for 24 hours prior to the procedure

## 2013-11-11 ENCOUNTER — Other Ambulatory Visit: Payer: Self-pay | Admitting: *Deleted

## 2013-11-11 DIAGNOSIS — I83892 Varicose veins of left lower extremities with other complications: Secondary | ICD-10-CM

## 2013-11-12 ENCOUNTER — Encounter (HOSPITAL_COMMUNITY): Payer: Self-pay | Admitting: Family Medicine

## 2013-11-23 ENCOUNTER — Other Ambulatory Visit: Payer: Self-pay | Admitting: Pulmonary Disease

## 2013-11-23 ENCOUNTER — Encounter: Payer: Self-pay | Admitting: Pulmonary Disease

## 2013-11-23 ENCOUNTER — Encounter: Payer: Self-pay | Admitting: Adult Health

## 2013-11-23 MED ORDER — RIVAROXABAN 20 MG PO TABS: 20.0000 mg | ORAL_TABLET | Freq: Every day | ORAL | Status: DC

## 2013-12-08 ENCOUNTER — Encounter: Payer: Self-pay | Admitting: Vascular Surgery

## 2013-12-09 ENCOUNTER — Encounter: Payer: Self-pay | Admitting: Vascular Surgery

## 2013-12-09 ENCOUNTER — Ambulatory Visit (INDEPENDENT_AMBULATORY_CARE_PROVIDER_SITE_OTHER): Payer: 59 | Admitting: Vascular Surgery

## 2013-12-09 VITALS — BP 133/83 | HR 61 | Resp 16 | Ht 66.0 in | Wt 266.0 lb

## 2013-12-09 DIAGNOSIS — I83892 Varicose veins of left lower extremities with other complications: Secondary | ICD-10-CM

## 2013-12-09 DIAGNOSIS — I83899 Varicose veins of unspecified lower extremities with other complications: Secondary | ICD-10-CM | POA: Insufficient documentation

## 2013-12-09 HISTORY — PX: ENDOVENOUS ABLATION SAPHENOUS VEIN W/ LASER: SUR449

## 2013-12-09 NOTE — Progress Notes (Signed)
   Laser Ablation Procedure      Date: 12/09/2013    [name] DOB:Nov 16, 1960  Consent signed: Yes  Surgeon:T.F. Etoile Looman  Procedure: Laser Ablation: left Greater Saphenous Vein  BP 133/83 mmHg  Pulse 61  Resp 16  Ht 5\' 6"  (1.676 m)  Wt 266 lb (120.657 kg)  BMI 42.95 kg/m2  Start time: 0845   End time: 1000  Tumescent Anesthesia: 375 cc 0.9% NaCl with 50 cc Lidocaine HCL with 1% Epi and 15 cc 8.4% NaHCO3  Local Anesthesia: 4 cc Lidocaine HCL and NaHCO3 (ratio 2:1)  Pulsed mode:15 watts, 522ms delay, 1.0 duration and Total energy: 1497, Total pulses: 2, Total time: 1;39     Patient tolerated procedure well: Yes  Notes: the patient had 2 prior ablations of her left leg with recanalization of an area at her proximal calf and distal thigh and also near the saphenofemoral junction. She had 2 different accesses of her great saphenous vein in the below-knee position and that the mid thigh. Both of these were treated with ablation without difficulty. She did have major PE last year and therefore was only held off her Xarelto for 24 hours  Description of Procedure:  After marking the course of the secondary varicosities, the patient was placed on the operating table in the supine position, and the left leg was prepped and draped in sterile fashion.   Local anesthetic was administered and under ultrasound guidance the saphenous vein was accessed with a micro needle and guide wire; then the mirco puncture sheath was place.  A guide wire was inserted saphenofemoral junction , followed by a 5 french sheath.  The position of the sheath and then the laser fiber below the junction was confirmed using the ultrasound.  Tumescent anesthesia was administered along the course of the saphenous vein using ultrasound guidance. The patient was placed in Trendelenburg position and protective laser glasses were placed on patient and staff, and the laser was fired at at 15 watt continuous mode for a total  of 1297 joules.    Steri strips were applied to the stab wounds and ABD pads and thigh high compression stockings were applied.  Ace wrap bandages were applied over the phlebectomy sites and at the top of the saphenofemoral junction. Blood loss was less than 15 cc.  The patient ambulated out of the operating room having tolerated the procedure well.

## 2013-12-10 ENCOUNTER — Telehealth: Payer: Self-pay | Admitting: *Deleted

## 2013-12-10 NOTE — Telephone Encounter (Signed)
    12/10/2013  Time: 4:20 PM   Patient Name: [name]  Patient of: T.F. Early  Procedure:Laser Ablation left greater saphenous vein 12-09-2013  Reached patient at home and checked  Her status  Yes    Comments/Actions Taken: [name] states she is not experiencing pain in her left leg.  States she did experience mild discomfort in her left leg last night for about thirty minutes which resolved without medication.  She is unable to take Ibuprofen.  [name] is being compliant with wearing compression dressing and keeping her left leg elevated.   Reviewed post procedural instructions with her and reminded her of post ablation duplex and [facility] FU appointment with Dr. Donnetta Hutching on 12-17-2013.  Encouraged her to call [facility] if she has any questions or concerns.      @SIGNATURE @

## 2013-12-16 ENCOUNTER — Encounter: Payer: Self-pay | Admitting: Vascular Surgery

## 2013-12-17 ENCOUNTER — Encounter: Payer: Self-pay | Admitting: Vascular Surgery

## 2013-12-17 ENCOUNTER — Ambulatory Visit (HOSPITAL_COMMUNITY)
Admission: RE | Admit: 2013-12-17 | Discharge: 2013-12-17 | Disposition: A | Discharge status: Home / Self Care | Payer: 59 | Source: Ambulatory Visit | Attending: Vascular Surgery | Admitting: Vascular Surgery

## 2013-12-17 ENCOUNTER — Ambulatory Visit (INDEPENDENT_AMBULATORY_CARE_PROVIDER_SITE_OTHER): Payer: 59 | Admitting: Vascular Surgery

## 2013-12-17 VITALS — BP 131/86 | HR 62 | Resp 14

## 2013-12-17 DIAGNOSIS — I83892 Varicose veins of left lower extremities with other complications: Secondary | ICD-10-CM | POA: Diagnosis not present

## 2013-12-25 ENCOUNTER — Other Ambulatory Visit: Payer: Self-pay

## 2013-12-25 DIAGNOSIS — Z1231 Encounter for screening mammogram for malignant neoplasm of breast: Secondary | ICD-10-CM

## 2014-02-10 ENCOUNTER — Ambulatory Visit
Admission: RE | Admit: 2014-02-10 | Discharge: 2014-02-10 | Disposition: A | Discharge status: Home / Self Care | Payer: 59 | Source: Ambulatory Visit

## 2014-02-10 DIAGNOSIS — Z1231 Encounter for screening mammogram for malignant neoplasm of breast: Secondary | ICD-10-CM

## 2014-04-19 ENCOUNTER — Other Ambulatory Visit: Payer: Self-pay | Admitting: Pulmonary Disease

## 2014-04-19 NOTE — Telephone Encounter (Signed)
Pt last seen in office by TP on 09/08/13. Xarelto last refilled 11/23/13 #30 x 3 refills No pending appt Please advise Dr. Halford Chessman on refill? thanks

## 2014-08-19 NOTE — Progress Notes (Signed)
Patient was no-show

## 2014-09-02 ENCOUNTER — Other Ambulatory Visit: Payer: Self-pay | Admitting: Pulmonary Disease

## 2014-09-02 ENCOUNTER — Telehealth: Payer: Self-pay | Admitting: Pulmonary Disease

## 2014-09-02 NOTE — Telephone Encounter (Signed)
Xarelto has been refilled. Patient scheduled to see Dr. Halford Chessman on 9/22 Patient notified. Nothing further needed.

## 2014-10-19 ENCOUNTER — Telehealth: Payer: Self-pay | Admitting: Pulmonary Disease

## 2014-10-19 MED ORDER — RIVAROXABAN 20 MG PO TABS: ORAL_TABLET | ORAL | Status: DC

## 2014-10-19 NOTE — Telephone Encounter (Signed)
Called and spoke to pt. Pt requesting a refill of Xarelto to last her till her appt with Dr. Halford Chessman on 10/28/14. Rx sent. Pt verbalized understanding and denied any further questions or concerns at this time.

## 2014-10-28 ENCOUNTER — Encounter: Payer: Self-pay | Admitting: Pulmonary Disease

## 2014-10-28 ENCOUNTER — Ambulatory Visit (INDEPENDENT_AMBULATORY_CARE_PROVIDER_SITE_OTHER): Payer: 59 | Admitting: Pulmonary Disease

## 2014-10-28 VITALS — BP 118/78 | HR 62 | Temp 98.1°F | Ht 66.0 in | Wt 243.2 lb

## 2014-10-28 DIAGNOSIS — I2699 Other pulmonary embolism without acute cor pulmonale: Secondary | ICD-10-CM

## 2014-10-28 MED ORDER — RIVAROXABAN 20 MG PO TABS: ORAL_TABLET | ORAL | Status: DC

## 2014-10-28 NOTE — Patient Instructions (Signed)
Follow up with pulmonary medicine as needed 

## 2014-10-28 NOTE — Progress Notes (Signed)
Chief Complaint  Patient presents with  . Follow-up    pt states she is well. pt states shes has a little increase in SOB. pt states shes had plenty of colds come on but that seems to have gotten better. no concerns at this time.     History of Present Illness: [name] is a 54 y.o. female with PE dx in May 2015.  She was advised she needed a f/u to get refill on her xarelto.  She denies chest pain, palpitations, dyspnea, or leg swelling.  She gets occasional bruising, but not bad.  TESTS: CT angiogram chest 06/11/13 >> saddle PE Doppler legs 06/11/13 >> acute DVT Rt posterior tibial vein  Echo 06/11/13 >> EF 55 to 60%, paradoxical septum motion, mod RA/RV dilation, ?medium sized secundum ASD, PAS 41 mmHg Catheter directed thrombolysis 06/12/13 >> by IR Hypercoagulable panel 06/12/13 >> Anticardiolipin IgG/IgM/IgA negative, Prothrombin gene mutation negative, Factor 5 leiden negative, Homocysteine 6.1, Beta 2 glycoprotein negative, lupus anticoagulant negative, Protein C negative, Protein S negative, Antithrombin III negative Echo 06/22/13 >> no ASD V/Q scan 08/18/13 >> normal  PMHx, PSHx, Medications, Allergies, Fhx, Shx reviewed.   Physical Exam: BP 118/78 mmHg  Pulse 62  Temp(Src) 98.1 F (36.7 C)  Ht 5\' 6"  (1.676 m)  Wt 243 lb 3.2 oz (110.315 kg)  BMI 39.27 kg/m2  SpO2 99%  General - No distress ENT - No sinus tenderness, no oral exudate, no LAN Cardiac - s1s2 regular, no murmur Chest - No wheeze/rales/dullness Back - No focal tenderness Abd - Soft, non-tender Ext - Lt leg edema Neuro - Normal strength Skin - No rashes Psych - normal mood, and behavior   Assessment/Plan:  Unprovoked pulmonary embolism in May 2015. Plan: - she will need to remain on xarelto indefinitely - advised she can get future refills from her PCP and return to pulmonary medicine as needed   Sherri Mires, MD [place] Pager:  [phone]

## 2014-11-30 ENCOUNTER — Telehealth (HOSPITAL_COMMUNITY): Payer: Self-pay

## 2014-11-30 NOTE — Telephone Encounter (Signed)
This patient is overdue for recommended follow-up with a bariatric surgeon at [facility].   A letter was mailed to the patient on 10.6.16 to the address on file from [place] in attempt to re-establish care & advising the patient on the benefits of follow-up care and directing them to call [facility] at [phone] to schedule an appointment at their earliest convenience. The letter included a patient survey which was returned to the [facility] Bariatric Dept today. Patient requested not to be called but she will schedule a follow-up visit. Forwarded survey to [facility] advising that patient should be contacting them to schedule appt.herself.   Alphonsa Overall. [facility] Bariatric Office Coordinator [phone]

## 2015-02-11 MED FILL — XARELTO 20 MG TABLET: 20 | 30 days supply | Qty: 30 | Fill #2

## 2015-02-21 MED FILL — OMEPRAZOLE DR 20 MG CAPSULE: 20 | 90 days supply | Qty: 180 | Fill #1

## 2015-02-28 DIAGNOSIS — M25562 Pain in left knee: Secondary | ICD-10-CM | POA: Diagnosis not present

## 2015-03-25 MED FILL — XARELTO 20 MG TABLET: 20 | 30 days supply | Qty: 30 | Fill #3

## 2015-04-07 DIAGNOSIS — R600 Localized edema: Secondary | ICD-10-CM | POA: Diagnosis not present

## 2015-04-07 DIAGNOSIS — I83813 Varicose veins of bilateral lower extremities with pain: Secondary | ICD-10-CM | POA: Diagnosis not present

## 2015-04-18 MED FILL — XARELTO 20 MG TABLET: 20 | 30 days supply | Qty: 30 | Fill #4

## 2015-04-27 DIAGNOSIS — Z1329 Encounter for screening for other suspected endocrine disorder: Secondary | ICD-10-CM | POA: Diagnosis not present

## 2015-04-27 DIAGNOSIS — E041 Nontoxic single thyroid nodule: Secondary | ICD-10-CM | POA: Diagnosis not present

## 2015-04-27 DIAGNOSIS — Z1231 Encounter for screening mammogram for malignant neoplasm of breast: Secondary | ICD-10-CM | POA: Diagnosis not present

## 2015-04-27 DIAGNOSIS — Z6841 Body Mass Index (BMI) 40.0 and over, adult: Secondary | ICD-10-CM | POA: Diagnosis not present

## 2015-04-27 DIAGNOSIS — Z1321 Encounter for screening for nutritional disorder: Secondary | ICD-10-CM | POA: Diagnosis not present

## 2015-04-27 DIAGNOSIS — Z01419 Encounter for gynecological examination (general) (routine) without abnormal findings: Secondary | ICD-10-CM | POA: Diagnosis not present

## 2015-04-27 MED FILL — CLOTRIMAZOLE-BETAMETHASONE: 1-0.05 | 20 days supply | Qty: 45 | Fill #0

## 2015-04-27 MED FILL — TERCONAZOLE 0.8% VAGINAL CR: 0.8 | 3 days supply | Qty: 20 | Fill #0

## 2015-04-28 ENCOUNTER — Other Ambulatory Visit (HOSPITAL_COMMUNITY): Payer: Self-pay | Admitting: Obstetrics and Gynecology

## 2015-04-28 DIAGNOSIS — E041 Nontoxic single thyroid nodule: Secondary | ICD-10-CM

## 2015-05-03 ENCOUNTER — Ambulatory Visit (HOSPITAL_COMMUNITY): Admission: RE | Admit: 2015-05-03 | Payer: 59 | Source: Ambulatory Visit

## 2015-05-04 ENCOUNTER — Encounter (HOSPITAL_COMMUNITY): Payer: Self-pay | Admitting: Emergency Medicine

## 2015-05-04 ENCOUNTER — Emergency Department (HOSPITAL_COMMUNITY)
Admission: EM | Admit: 2015-05-04 | Discharge: 2015-05-04 | Disposition: A | Discharge status: Home / Self Care | Payer: 59 | Attending: Emergency Medicine | Admitting: Emergency Medicine

## 2015-05-04 ENCOUNTER — Emergency Department (HOSPITAL_COMMUNITY): Payer: 59

## 2015-05-04 DIAGNOSIS — K219 Gastro-esophageal reflux disease without esophagitis: Secondary | ICD-10-CM | POA: Diagnosis not present

## 2015-05-04 DIAGNOSIS — M1990 Unspecified osteoarthritis, unspecified site: Secondary | ICD-10-CM | POA: Insufficient documentation

## 2015-05-04 DIAGNOSIS — Z8679 Personal history of other diseases of the circulatory system: Secondary | ICD-10-CM | POA: Diagnosis not present

## 2015-05-04 DIAGNOSIS — E119 Type 2 diabetes mellitus without complications: Secondary | ICD-10-CM | POA: Diagnosis not present

## 2015-05-04 DIAGNOSIS — K5900 Constipation, unspecified: Secondary | ICD-10-CM | POA: Diagnosis not present

## 2015-05-04 DIAGNOSIS — Z86711 Personal history of pulmonary embolism: Secondary | ICD-10-CM | POA: Insufficient documentation

## 2015-05-04 DIAGNOSIS — Z79899 Other long term (current) drug therapy: Secondary | ICD-10-CM | POA: Insufficient documentation

## 2015-05-04 DIAGNOSIS — Z880 Allergy status to penicillin: Secondary | ICD-10-CM | POA: Diagnosis not present

## 2015-05-04 DIAGNOSIS — I4891 Unspecified atrial fibrillation: Secondary | ICD-10-CM | POA: Diagnosis not present

## 2015-05-04 DIAGNOSIS — Z862 Personal history of diseases of the blood and blood-forming organs and certain disorders involving the immune mechanism: Secondary | ICD-10-CM | POA: Diagnosis not present

## 2015-05-04 DIAGNOSIS — R42 Dizziness and giddiness: Secondary | ICD-10-CM | POA: Diagnosis not present

## 2015-05-04 DIAGNOSIS — Z7901 Long term (current) use of anticoagulants: Secondary | ICD-10-CM | POA: Insufficient documentation

## 2015-05-04 DIAGNOSIS — Z86718 Personal history of other venous thrombosis and embolism: Secondary | ICD-10-CM | POA: Diagnosis not present

## 2015-05-04 DIAGNOSIS — R002 Palpitations: Secondary | ICD-10-CM | POA: Diagnosis not present

## 2015-05-04 DIAGNOSIS — Z8639 Personal history of other endocrine, nutritional and metabolic disease: Secondary | ICD-10-CM | POA: Insufficient documentation

## 2015-05-04 DIAGNOSIS — Z791 Long term (current) use of non-steroidal anti-inflammatories (NSAID): Secondary | ICD-10-CM | POA: Insufficient documentation

## 2015-05-04 LAB — CBC
HCT: 44.6 % (ref 36.0–46.0)
Hemoglobin: 14 g/dL (ref 12.0–15.0)
MCH: 26.1 pg (ref 26.0–34.0)
MCHC: 31.4 g/dL (ref 30.0–36.0)
MCV: 83.2 fL (ref 78.0–100.0)
PLATELETS: 153 10*3/uL (ref 150–400)
RBC: 5.36 MIL/uL — AB (ref 3.87–5.11)
RDW: 14.8 % (ref 11.5–15.5)
WBC: 5.5 10*3/uL (ref 4.0–10.5)

## 2015-05-04 LAB — T4, FREE: Free T4: 0.78 ng/dL (ref 0.61–1.12)

## 2015-05-04 LAB — BASIC METABOLIC PANEL
Anion gap: 10 (ref 5–15)
BUN: 16 mg/dL (ref 6–20)
CALCIUM: 9.2 mg/dL (ref 8.9–10.3)
CO2: 24 mmol/L (ref 22–32)
CREATININE: 0.75 mg/dL (ref 0.44–1.00)
Chloride: 106 mmol/L (ref 101–111)
Glucose, Bld: 98 mg/dL (ref 65–99)
Potassium: 3.8 mmol/L (ref 3.5–5.1)
Sodium: 140 mmol/L (ref 135–145)

## 2015-05-04 LAB — D-DIMER, QUANTITATIVE: D-Dimer, Quant: 0.37 ug/mL-FEU (ref 0.00–0.50)

## 2015-05-04 LAB — I-STAT TROPONIN, ED: Troponin i, poc: 0 ng/mL (ref 0.00–0.08)

## 2015-05-04 LAB — TSH: TSH: 2.492 u[IU]/mL (ref 0.350–4.500)

## 2015-05-04 MED ORDER — METOPROLOL TARTRATE 25 MG PO TABS: 25.0000 mg | ORAL_TABLET | Freq: Once | ORAL | Status: DC

## 2015-05-04 MED ORDER — METOPROLOL TARTRATE 25 MG PO TABS: 12.5000 mg | ORAL_TABLET | Freq: Two times a day (BID) | ORAL | Status: DC

## 2015-05-04 MED ORDER — SODIUM CHLORIDE 0.9 % IV BOLUS (SEPSIS)
1000.0000 mL | Freq: Once | INTRAVENOUS | Status: AC
  Administered 2015-05-04: 1000 mL via INTRAVENOUS

## 2015-05-04 MED ORDER — DILTIAZEM LOAD VIA INFUSION
10.0000 mg | Freq: Once | INTRAVENOUS | Status: AC
  Administered 2015-05-04: 10 mg via INTRAVENOUS
  Filled 2015-05-04: qty 10

## 2015-05-04 MED ORDER — DILTIAZEM HCL 100 MG IV SOLR
5.0000 mg/h | INTRAVENOUS | Status: DC
  Administered 2015-05-04: 5 mg/h via INTRAVENOUS
  Filled 2015-05-04: qty 100

## 2015-05-04 MED ORDER — METOPROLOL TARTRATE 1 MG/ML IV SOLN
5.0000 mg | Freq: Once | INTRAVENOUS | Status: AC
  Administered 2015-05-04: 5 mg via INTRAVENOUS
  Filled 2015-05-04: qty 5

## 2015-05-04 NOTE — ED Provider Notes (Signed)
CSN: [id number]     Arrival date & time 05/04/15  1817 History   First MD Initiated Contact with Patient 05/04/15 1854     Chief Complaint  Patient presents with  . Shortness of Breath  . Palpitations      HPI  44 her old female with prior history of pulmonary embolism 2 years ago, on Xarelto since, presenting with shortness of breath, palpitations, lightheadedness for the past 2 days. Patient is able to pinpoint exactly when the symptoms started. She says that she had similar symptoms with her prior pulmonary embolism, however had chest pain at that point which he does not have currently. She denies nausea, vomiting, abdominal pain. No infectious symptoms. Denies syncope. No history of arrhythmia. No cardiac history.  Past Medical History  Diagnosis Date  . Leg swelling 03-24-13    not a problem any longer  . Difficulty in swallowing 03-24-13    no longer a problem  . Arthritis   . Bruises easily   . Reflux   . Shortness of breath   . Blood transfusion   . GERD (gastroesophageal reflux disease)   . Peripheral vascular disease ([place])   . Anemia   . Diabetes mellitus     Pt had DM prior to Lap Band. Does not have it now per pt.  . Bleeding ulcer 12-2010  hospitalized for 1 week  . Constipation   . Vomiting 03-24-13    problem resolved after lap band revision  . Blood transfusion without reported diagnosis     2011-2 units transfused, bleeding ulcer  . Hyperlipidemia   . DVT (deep venous thrombosis) ([place]) 06-2013    right leg  . Pulmonary embolus ([place]) 06-2013   Past Surgical History  Procedure Laterality Date  . Laparoscopic gastric banding  01-05-2008  . Endovenous ablation saphenous vein w/ laser  06-2008  . Salphingooophorectomy Right     cyst removal  . Wedge resection of ovary  LEFT OVARY   . Carpel tunnel bilatereal    . Diagnostic laparoscopy    . Appendectomy    . Laparoscopic gastric banding  12/18/2010    01/04/2009  . Esophagogastroduodenoscopy  12/18/2010    Procedure: ESOPHAGOGASTRODUODENOSCOPY (EGD);  Surgeon: Gatha Mayer, MD;  Location: [facility];  Service: Endoscopy;  Laterality: N/A;  . Tonsillectomy    . Cholecystectomy      5'12  . Colonoscopy N/A 04/10/2013    Procedure: COLONOSCOPY;  Surgeon: Beryle Beams, MD;  Location: [facility];  Service: Endoscopy;  Laterality: N/A;  . Endovenous ablation saphenous vein w/ laser Left 12-09-2013    EVLA LEFT GREATER SAPHENOUS VEIN BY TODD EARLY MD   Family History  Problem Relation Age of Onset  . Heart disease Father   . Kidney disease Father   . Peripheral vascular disease Father   . Heart attack Father     35  . Diabetes Mother   . Arthritis Mother    Social History  Substance Use Topics  . Smoking status: Never Smoker   . Smokeless tobacco: Never Used  . Alcohol Use: No   OB History    No data available     Review of Systems  Constitutional: Negative for fever, chills, activity change and appetite change.  HENT: Negative for congestion, rhinorrhea and sore throat.   Eyes: Negative for visual disturbance.  Respiratory: Positive for shortness of breath. Negative for cough and wheezing.   Cardiovascular: Positive for palpitations. Negative for chest pain.  Gastrointestinal:  Negative for nausea, vomiting, abdominal pain, diarrhea, constipation, blood in stool and abdominal distention.  Genitourinary: Negative for dysuria, frequency and flank pain.  Musculoskeletal: Negative for myalgias, back pain, joint swelling, arthralgias, gait problem, neck pain and neck stiffness.  Skin: Negative for rash.  Neurological: Positive for light-headedness. Negative for dizziness, tremors, syncope, facial asymmetry, speech difficulty, weakness, numbness and headaches.  Psychiatric/Behavioral: Negative for behavioral problems, confusion and agitation.      Allergies  Ancef; Contrast media; and Penicillins  Home Medications   Prior to Admission medications   Medication Sig Start  Date End Date Taking? Authorizing Provider  acetaminophen (TYLENOL) 500 MG tablet Take 500 mg by mouth as needed for mild pain.    Yes Historical Provider, MD  celecoxib (CELEBREX) 200 MG capsule Take 200 mg by mouth as needed for mild pain.    Yes Historical Provider, MD  docusate sodium (COLACE) 100 MG capsule Take 200 mg by mouth daily.    Yes Historical Provider, MD  Linaclotide (LINZESS) 145 MCG CAPS capsule Take 145 mcg by mouth daily as needed (for IBS pain).   Yes Historical Provider, MD  Multiple Vitamins-Minerals (MULTIVITAMINS THER. W/MINERALS) TABS Take 1 tablet by mouth daily.     Yes Historical Provider, MD  omeprazole (PRILOSEC) 20 MG capsule Take 20 mg by mouth 2 (two) times daily before a meal.   Yes Historical Provider, MD  Pseudoeph-Doxylamine-DM-APAP (NYQUIL PO) Take 15 mLs by mouth at bedtime as needed (cold symptoms).   Yes Historical Provider, MD  Pseudoephedrine-APAP-DM (DAYQUIL PO) Take 15 mLs by mouth daily as needed (cold symptoms).   Yes Historical Provider, MD  rivaroxaban (XARELTO) 20 MG TABS tablet TAKE 1 TABLET BY MOUTH ONCE DAILY WITH SUPPER 10/28/14  Yes Chesley Mires, MD  metoprolol tartrate (LOPRESSOR) 25 MG tablet Take 0.5 tablets (12.5 mg total) by mouth 2 (two) times daily. 05/04/15   Alanson Hausmann Algernon Huxley, MD   BP 97/65 mmHg  Pulse 70  Temp(Src) 98.8 F (37.1 C) (Oral)  Resp 18  SpO2 98% Physical Exam  Constitutional: She is oriented to person, place, and time. She appears well-developed and well-nourished. No distress.  HENT:  Head: Normocephalic and atraumatic.  Right Ear: External ear normal.  Left Ear: External ear normal.  Nose: Nose normal.  Mouth/Throat: Oropharynx is clear and moist. No oropharyngeal exudate.  Eyes: Conjunctivae and EOM are normal. Pupils are equal, round, and reactive to light. Right eye exhibits no discharge. Left eye exhibits no discharge.  Neck: Normal range of motion. Neck supple.  Cardiovascular: Normal heart sounds and  intact distal pulses.  Exam reveals no gallop and no friction rub.   No murmur heard. Tachycardic to 150 bpm, irregularly irregular rhythm  Pulmonary/Chest: Effort normal and breath sounds normal. No respiratory distress. She has no wheezes. She has no rales.  Abdominal: Soft. Bowel sounds are normal. She exhibits no distension and no mass. There is no tenderness. There is no rebound and no guarding.  Musculoskeletal: Normal range of motion. She exhibits no edema or tenderness.  Neurological: She is alert and oriented to person, place, and time. She exhibits normal muscle tone.  Skin: Skin is warm and dry. No rash noted. She is not diaphoretic.  Psychiatric: She has a normal mood and affect. Her behavior is normal. Judgment and thought content normal.    ED Course  Procedures (including critical care time) Labs Review Labs Reviewed  CBC - Abnormal; Notable for the following:    RBC 5.36 (*)  All other components within normal limits  BASIC METABOLIC PANEL  D-DIMER, QUANTITATIVE (NOT AT [facility])  TSH  T4, FREE  I-STAT TROPOININ, ED    Imaging Review Dg Chest Portable 1 View  05/04/2015  CLINICAL DATA:  Palpitations EXAM: PORTABLE CHEST 1 VIEW COMPARISON:  09/04/2013 chest radiograph. FINDINGS: Stable cardiomediastinal silhouette with normal heart size. No pneumothorax. No pleural effusion. Lungs appear clear, with no acute consolidative airspace disease and no pulmonary edema. IMPRESSION: No active disease. Electronically Signed   By: Ilona Sorrel M.D.   On: 05/04/2015 19:21   I have personally reviewed and evaluated these images and lab results as part of my medical decision-making.   EKG Interpretation   Date/Time:  Wednesday May 04 2015 18:24:07 EDT Ventricular Rate:  154 PR Interval:    QRS Duration: 114 QT Interval:  326 QTC Calculation: 522 R Axis:   48 Text Interpretation:  Atrial fibrillation with rapid ventricular response  Low voltage QRS Incomplete right bundle  branch block Confirmed by STEINL   MD, Lennette Bihari ([id number]) on 05/04/2015 7:13:25 PM      MDM   Final diagnoses:  Atrial fibrillation, unspecified type ([place])   Patient is overall well-appearing but reports mild shortness of breath as well as palpitations. EKG reveals atrial fibrillation with RVR. Patient does not have a history of the same, though is anticoagulated on Xarelto for prior PE. Denies chest pain. D-dimer is not elevated and I doubt PE at this time. Chest x-ray with no cardiopulmonary abnormalities. Troponin 0. Patient denies infectious symptoms and thyroid studies are within normal limits.  Patient given Cardizem bolus and then started on a Cardizem drip and given a dose of Lopressor in the ED, with spontaneous conversion of atrial fibrillation to normal sinus rhythm. She remained in normal sinus rhythm for several hours and Cardizem drip was weaned. Patient was given the option for admission to the hospital but would rather be started on a beta blocker and follow up with cardiology as an outpatient. As she is asymptomatic, on Xarelto, has a PCP, and remains in NSR, I think that this is reasonable. I will start her on 12.5 mg of metoprolol bid. She was given strict precautions to return to the ED immediately for recurrence of symptoms, and will call to schedule an appointment with cardiology tomorrow.      Hildred Pharo Algernon Huxley, MD 05/04/15 [id number]  Lajean Saver, MD 05/05/15 [phone]

## 2015-05-04 NOTE — ED Notes (Signed)
Pt sts SOB and palpitations and noted to be tachy; pt sts PE and taking xerelto

## 2015-05-04 NOTE — Discharge Instructions (Signed)

## 2015-05-05 MED FILL — METOPROLOL TARTRATE 25 MG T: 25 | 30 days supply | Qty: 30 | Fill #0

## 2015-05-06 ENCOUNTER — Ambulatory Visit (INDEPENDENT_AMBULATORY_CARE_PROVIDER_SITE_OTHER): Payer: 59 | Admitting: Cardiovascular Disease

## 2015-05-06 ENCOUNTER — Encounter: Payer: Self-pay | Admitting: Cardiovascular Disease

## 2015-05-06 VITALS — BP 108/76 | HR 64 | Ht 66.0 in | Wt 244.0 lb

## 2015-05-06 DIAGNOSIS — I480 Paroxysmal atrial fibrillation: Secondary | ICD-10-CM

## 2015-05-06 NOTE — Patient Instructions (Signed)

## 2015-05-06 NOTE — Progress Notes (Signed)
Roger Shelter Date of Birth:  03-08-1960 [place] [address] [place], [place]  [address] [phone]        Fax   [phone]  Problem list 1. Pulmonary embolus 2. Atrial fibrillation 3.  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  The patient has an interesting cardiopulmonary history.  She was in her usual state of health until late April 2015 when she began to have pulmonary congestion symptoms and thought that she had the flu.  She saw her PCP in [place] several times initially her oxygen saturation at the first visit was 96% and the second visit was 92%.  She continued to worsen and collapsed at home.  She was brought to [place] [facility] where in the emergency room her d-dimer was 13 and she underwent a CT angiogram which showed massive pulmonary embolus with saddle embolus.  She had slight elevation of her troponins consistent with pulmonary embolus.  Her initial echocardiogram on 06/11/13 showed a moderate hypertension with a pulmonary artery pressure of 41.  The ventricular septum showed paradoxic motion consistent with right ventricular volume overload.  There was a probable medium sized ostium secundum ASD present.  The right atrium was markedly dilated.  The patient went on to be treated with transcatheter ultrasound assisted thrombolytic infusion therapy for her saddle embolus.  She had a good clinical response.  She had a followup echocardiogram on 06/22/13 which showed that her pulmonary artery pressure had returned to normal at 16.  The right atrial size was normal.  The right ventricular size was normal.  There was no evidence of ASD or PFO. The patient was evaluated for hypercoagulable state as the cause of her pulmonary embolus and her DVT of her right leg.  However the hypercoagulability workup was negative.  The patient does not have any family history of abnormal blood clotting. The patient herself has no prior cardiac history.   She does have a past history of morbid obesity and has had lap band surgery by Dr. Johnathan Hausen.  The patient initially weighed about 388 pounds.  Since her surgery she has lost approximately 130 pounds. She still is experiencing mild exertional dyspnea.  She denies any chest discomfort.  She has not been aware of any racing of her heart.  May 06, 2015 Was recently found have atrial fibrillation. She's been having some increased shortness of breath and fatigue. She [facility] and was found have atrial fibrillation at rate of 154. Was treated with Diltiazem and metoprolol and she feels much better.   Thinks that she has converted .   Current Outpatient Prescriptions  Medication Sig Dispense Refill  . acetaminophen (TYLENOL) 500 MG tablet Take 500 mg by mouth as needed for mild pain.     . celecoxib (CELEBREX) 200 MG capsule Take 200 mg by mouth as needed for mild pain.     Marland Kitchen docusate sodium (COLACE) 100 MG capsule Take 200 mg by mouth daily.     . Linaclotide (LINZESS) 145 MCG CAPS capsule Take 145 mcg by mouth daily as needed (for IBS pain).    . metoprolol tartrate (LOPRESSOR) 25 MG tablet Take 0.5 tablets (12.5 mg total) by mouth 2 (two) times daily. 30 tablet 0  . Multiple Vitamins-Minerals (MULTIVITAMINS THER. W/MINERALS) TABS Take 1 tablet by mouth daily.      Marland Kitchen omeprazole (PRILOSEC) 20 MG capsule Take 20 mg by mouth 2 (two)  times daily before a meal.    . Pseudoephedrine-APAP-DM (DAYQUIL PO) Take 15 mLs by mouth daily as needed (cold symptoms).    . rivaroxaban (XARELTO) 20 MG TABS tablet TAKE 1 TABLET BY MOUTH ONCE DAILY WITH SUPPER 30 tablet 6   No current facility-administered medications for this visit.    Allergies  Allergen Reactions  . Ancef [Cefazolin Sodium] Nausea And Vomiting  . Contrast Media [Iodinated Diagnostic Agents]     apnea  . Penicillins Other (See Comments)    Patient does not recall the reaction, it happened when she was a child     Patient Active Problem List   Diagnosis Date Noted  . Varicose veins of lower extremities with complications 99991111  . Pain and swelling of left lower leg 10/27/2013  . Pain of left lower extremity 10/27/2013  . Gastroenteritis, acute 09/08/2013  . Fever, unspecified 09/04/2013  . ASD (atrial septal defect) 08/13/2013  . Pulmonary embolism ([place]) 06/22/2013  . Dyspnea 06/11/2013  . Anterior slip repaired Oct 2012 01/18/2011  . GI bleed 12/18/2010  . Diabetes mellitus ([place]) 12/18/2010  . Varicose veins of lower extremities with other complications 99991111  . Diabetes mellitus   . HIP PAIN, RIGHT 03/06/2010    History  Smoking status  . Never Smoker   Smokeless tobacco  . Never Used    History  Alcohol Use No    Family History  Problem Relation Age of Onset  . Heart disease Father   . Kidney disease Father   . Peripheral vascular disease Father   . Heart attack Father     22  . Diabetes Mother   . Arthritis Mother     Review of Systems: Constitutional: no fever chills diaphoresis or fatigue or change in weight.  Head and neck: no hearing loss, no epistaxis, no photophobia or visual disturbance. Respiratory: No cough, shortness of breath or wheezing. Cardiovascular: No chest pain peripheral edema, palpitations. Gastrointestinal: No abdominal distention, no abdominal pain, no change in bowel habits hematochezia or melena. Genitourinary: No dysuria, no frequency, no urgency, no nocturia. Musculoskeletal:No arthralgias, no back pain, no gait disturbance or myalgias. Neurological: No dizziness, no headaches, no numbness, no seizures, no syncope, no weakness, no tremors. Hematologic: No lymphadenopathy, no easy bruising. Psychiatric: No confusion, no hallucinations, no sleep disturbance.    Physical Exam: Filed Vitals:   05/06/15 1541  BP: 108/76  Pulse: 64   general appearance reveals a large middle-aged woman in no acute distress.   Head and neck  exam reveals that the pupils are equal and reactive.  The extraocular movements are full.  There is no scleral icterus.    No lymphadenopathy.  No carotid bruits.  The jugular venous pressure is normal.  Thyroid is not enlarged or tender.  Chest is clear to percussion and auscultation.  No rales or rhonchi.  Expansion of the chest is symmetrical.  Heart:  RR   First and second heart sounds are normal.  There is no murmur gallop rub or click.   abdomen:   is obese, soft and nontender.  Bowel sounds are normoactive.     There are no abdominal bruits.  Extremities reveal no phlebitis or edema.  Pedal pulses are good.  There is no cyanosis or clubbing.  Neurologic exam is normal strength and no lateralizing weakness.  No sensory deficits.  Integument reveals no rash  EKG :  Ordered today  - NSR at 60.   Low voltage.   No ST  or T wave changes  Assessment / Plan: 1. Paroxysmal atrial fib:   Has converted to NSR on Dilt drip . Rate is well controlled on metoprolol .  2. history of massive pulmonary embolus with saddle embolus in May 2015 - continue Xarelto.  3. history of DVT of right lower extremity preceding her pulmonary embolus 4. transient right heart dilatation and strain secondary to acute right heart strain secondary to pulmonary embolus with transient small biventricular atrial septal defect noted on initial echocardiogram.  With restoration of normal right heart pressures, atrial septal defect is no longer seen. 5. morbid obesity    Tymika Grilli, Wonda Cheng, MD  05/06/2015 4:16 PM    [place],  [place], [place]  [address] Pager [phone] Phone: [phone]; Fax: [phone]   [facility]  [address] [place], [place]  [address] [phone]   Fax [phone]

## 2015-05-11 ENCOUNTER — Ambulatory Visit (HOSPITAL_COMMUNITY): Admission: RE | Admit: 2015-05-11 | Payer: 59 | Source: Ambulatory Visit

## 2015-05-20 MED FILL — XARELTO 20 MG TABLET: 20 | 30 days supply | Qty: 30 | Fill #5

## 2015-06-10 MED FILL — OMEPRAZOLE DR 20 MG CAPSULE: 20 | 30 days supply | Qty: 60 | Fill #0

## 2015-06-13 ENCOUNTER — Other Ambulatory Visit: Payer: Self-pay | Admitting: *Deleted

## 2015-06-13 MED ORDER — METOPROLOL TARTRATE 25 MG PO TABS: 12.5000 mg | ORAL_TABLET | Freq: Two times a day (BID) | ORAL | Status: DC

## 2015-06-13 MED FILL — XARELTO 20 MG TABLET: 20 | 30 days supply | Qty: 30 | Fill #6

## 2015-06-13 MED FILL — METOPROLOL TARTRATE 25 MG T: 25 | 30 days supply | Qty: 30 | Fill #0

## 2015-06-13 NOTE — Telephone Encounter (Signed)
Per message left by patient on the refill voicemail, Dr Acie Fredrickson had told her at her office visit that he would assume refilling the metoprolol for her. It was originally prescribed by a physician in the ED.

## 2015-07-28 ENCOUNTER — Other Ambulatory Visit: Payer: Self-pay | Admitting: Pulmonary Disease

## 2015-07-28 MED FILL — METOPROLOL TARTRATE 25 MG T: 25 | 30 days supply | Qty: 30 | Fill #1 | Status: TO

## 2015-07-28 MED FILL — OMEPRAZOLE DR 20 MG CAPSULE: 20 | 30 days supply | Qty: 60 | Fill #0 | Status: TO

## 2015-07-28 MED FILL — XARELTO 20 MG TABLET: 20 | 30 days supply | Qty: 30 | Fill #0 | Status: TO

## 2015-10-11 ENCOUNTER — Telehealth: Payer: Self-pay | Admitting: *Deleted

## 2015-10-11 ENCOUNTER — Other Ambulatory Visit: Payer: Self-pay | Admitting: *Deleted

## 2015-10-11 ENCOUNTER — Telehealth (HOSPITAL_COMMUNITY): Payer: Self-pay | Admitting: Vascular Surgery

## 2015-10-11 DIAGNOSIS — M79662 Pain in left lower leg: Secondary | ICD-10-CM

## 2015-10-11 DIAGNOSIS — M7989 Other specified soft tissue disorders: Secondary | ICD-10-CM

## 2015-10-11 NOTE — Telephone Encounter (Signed)
-----   Message from Rica Records, RN sent at 10/11/2015  1:36 PM EDT ----- Regarding: scheduling  Contact: [phone] Please schedule [name] for venous reflux exam (left leg, order in EPIC) ASAP and an appointment with Dr. Donnetta Hutching.  I know he has open slots on Thursday 10-13-2015 and we need a lab slot PRIOR to appointment with Dr. Donnetta Hutching (can be done on separate days if needed).  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  She has severe left leg swelling for 2 weeks and was seen by her PCP last week with worsening symptoms/no ultrasound was done by PCP.  Please let me know what you can work out!   Thanks!!!!!!

## 2015-10-11 NOTE — Telephone Encounter (Signed)
Mrs. Beem reports left leg  (foot, ankle, calf) " very swollen" with left leg"soreness, heaviness" in left calf for 2 weeks.  States she has seen Dr. Nicki Reaper at [facility] on 10-04-2015 for left leg swelling and symptoms have "worsened" since then.  Mrs. Moan states no ultrasound has has been done of left leg since onset of left leg swelling and pain.  States she is elevating left leg, wearing compression hose with no improvement of symptoms.  Mrs. Sisemore is established patient of Dr. Donnetta Hutching with history of endovenous laser ablation left greater saphenous vein in 2010, 2012, and 2015.  Sent staff message to schedulers to set up venous reflux exam (left leg) and appointment to see Dr. Donnetta Hutching ASAP.  Mrs. Sayward aware that schedulers will be calling her with appointments.

## 2015-10-11 NOTE — Telephone Encounter (Deleted)
-----   Message from Rica Records, RN sent at 10/11/2015  1:36 PM EDT ----- Regarding: scheduling  Contact: [phone] Please schedule [name] for venous reflux exam (left leg, order in EPIC) ASAP and an appointment with Dr. Donnetta Hutching.  I know he has open slots on Thursday 10-13-2015 and we need a lab slot PRIOR to appointment with Dr. Donnetta Hutching (can be done on separate days if needed).  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  She has severe left leg swelling for 2 weeks and was seen by her PCP last week with worsening symptoms/no ultrasound was done by PCP.  Please let me know what you can work out!   Thanks!!!!!!

## 2015-10-13 ENCOUNTER — Encounter: Payer: Self-pay | Admitting: Vascular Surgery

## 2015-10-14 ENCOUNTER — Ambulatory Visit (HOSPITAL_COMMUNITY)
Admission: RE | Admit: 2015-10-14 | Discharge: 2015-10-14 | Disposition: A | Discharge status: Home / Self Care | Payer: 59 | Source: Ambulatory Visit | Attending: Vascular Surgery | Admitting: Vascular Surgery

## 2015-10-14 DIAGNOSIS — M79605 Pain in left leg: Secondary | ICD-10-CM

## 2015-10-14 DIAGNOSIS — I8392 Asymptomatic varicose veins of left lower extremity: Secondary | ICD-10-CM | POA: Insufficient documentation

## 2015-10-14 DIAGNOSIS — M7989 Other specified soft tissue disorders: Secondary | ICD-10-CM | POA: Diagnosis not present

## 2015-10-14 DIAGNOSIS — M79662 Pain in left lower leg: Secondary | ICD-10-CM

## 2015-10-18 ENCOUNTER — Ambulatory Visit (INDEPENDENT_AMBULATORY_CARE_PROVIDER_SITE_OTHER): Payer: 59 | Admitting: Vascular Surgery

## 2015-10-18 ENCOUNTER — Encounter: Payer: Self-pay | Admitting: Vascular Surgery

## 2015-10-18 VITALS — BP 136/76 | HR 55 | Temp 98.9°F | Resp 18 | Ht 66.0 in | Wt 269.3 lb

## 2015-10-18 DIAGNOSIS — M7989 Other specified soft tissue disorders: Secondary | ICD-10-CM | POA: Diagnosis not present

## 2015-10-18 NOTE — Progress Notes (Signed)
HISTORY AND PHYSICAL     CC:  Pain/swelling in left leg Referring Provider:  Physicians, Di Kindle F*  HPI: This is a 55 y.o. female who has a hx of left GSV laser ablation by Dr. Donnetta Hutching in 2010.  She had recurrent sx in 2012 and repeat imaging showed partial recanalization and she underwent repeat laser ablation.  She had another recurrence of her sx in 2015 at which time, she reported pain with prolonged standing.    In May 2015, she had an episode of fatigue and shortness of breath and was found to have a major PE.  She had a saddle embolus and had successful tx with thrombolysis in the ICU.  She is now on Xarelto and will be required lifelong.   She presents today with increased swelling and pain in the left leg.  She has been wearing her compression stockings religiously.   Given her hx of PE, she presented for ultrasound.    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  She does have times of prolonged sitting, but tries to get up and walk about every 2 hours.    Since she was last seen in our office, she has been diagnosed with Afib and is now on a beta blocker.  She also started on Lexapro recently.   Past Medical History:  Diagnosis Date  . Anemia   . Arthritis   . Atrial fibrillation ([place])   . Bleeding ulcer 12-2010  hospitalized for 1 week  . Blood transfusion   . Blood transfusion without reported diagnosis    2011-2 units transfused, bleeding ulcer  . Bruises easily   . Constipation   . Diabetes mellitus    Pt had DM prior to Lap Band. Does not have it now per pt.  . Difficulty in swallowing 03-24-13   no longer a problem  . DVT (deep venous thrombosis) ([place]) 06-2013   right leg  . GERD (gastroesophageal reflux disease)   . Hyperlipidemia   . Leg swelling 03-24-13   not a problem any longer  . Peripheral vascular disease ([place])   . Pulmonary embolus ([place]) 06-2013  . Reflux   . Shortness of breath   . Vomiting  03-24-13   problem resolved after lap band revision    Past Surgical History:  Procedure Laterality Date  . APPENDECTOMY    . CARPEL TUNNEL BILATEREAL    . CHOLECYSTECTOMY     5'12  . COLONOSCOPY N/A 04/10/2013   Procedure: COLONOSCOPY;  Surgeon: Beryle Beams, MD;  Location: [facility];  Service: Endoscopy;  Laterality: N/A;  . DIAGNOSTIC LAPAROSCOPY    . ENDOVENOUS ABLATION SAPHENOUS VEIN W/ LASER  06-2008  . ENDOVENOUS ABLATION SAPHENOUS VEIN W/ LASER Left 12-09-2013   EVLA LEFT GREATER SAPHENOUS VEIN BY Xzaiver Vayda MD  . ESOPHAGOGASTRODUODENOSCOPY  12/18/2010   Procedure: ESOPHAGOGASTRODUODENOSCOPY (EGD);  Surgeon: Gatha Mayer, MD;  Location: [facility];  Service: Endoscopy;  Laterality: N/A;  . LAPAROSCOPIC GASTRIC BANDING  01-05-2008  . LAPAROSCOPIC GASTRIC BANDING  12/18/2010   01/04/2009  . SALPHINGOOOPHORECTOMY Right    cyst removal  . TONSILLECTOMY    . WEDGE RESECTION OF OVARY  LEFT OVARY     Allergies  Allergen Reactions  . Ancef [Cefazolin Sodium] Nausea And Vomiting  . Contrast Media [Iodinated Diagnostic Agents]     apnea  . Penicillins Other (See Comments)    Patient does not  recall the reaction, it happened when she was a child    Current Outpatient Prescriptions  Medication Sig Dispense Refill  . acetaminophen (TYLENOL) 500 MG tablet Take 500 mg by mouth as needed for mild pain.     . Biotin 10000 MCG TABS Take by mouth daily.    . celecoxib (CELEBREX) 200 MG capsule Take 200 mg by mouth as needed for mild pain.     . Cholecalciferol (VITAMIN D HIGH POTENCY) 1000 units capsule Take 1,000 Units by mouth daily.    Marland Kitchen docusate sodium (COLACE) 100 MG capsule Take 200 mg by mouth daily.     Marland Kitchen escitalopram (LEXAPRO) 10 MG tablet Take 10 mg by mouth daily.    . Linaclotide (LINZESS) 145 MCG CAPS capsule Take 145 mcg by mouth daily as needed (for IBS pain).    . metoprolol tartrate (LOPRESSOR) 25 MG tablet Take 0.5 tablets (12.5 mg total) by mouth 2 (two)  times daily. 30 tablet 10  . Multiple Vitamins-Minerals (MULTIVITAMINS THER. W/MINERALS) TABS Take 1 tablet by mouth daily.      Marland Kitchen omeprazole (PRILOSEC) 20 MG capsule Take 20 mg by mouth 2 (two) times daily before a meal.    . XARELTO 20 MG TABS tablet TAKE 1 TABLET BY MOUTH ONCE DAILY WITH SUPPER 30 tablet 0  . Pseudoephedrine-APAP-DM (DAYQUIL PO) Take 15 mLs by mouth daily as needed (cold symptoms).     No current facility-administered medications for this visit.     Family History  Problem Relation Age of Onset  . Heart disease Father   . Kidney disease Father   . Peripheral vascular disease Father   . Heart attack Father     39  . Diabetes Mother   . Arthritis Mother     Social History   Social History  . Marital status: Married    Spouse name: N/A  . Number of children: N/A  . Years of education: N/A   Occupational History  . RN [employer]   Social History Main Topics  . Smoking status: Never Smoker  . Smokeless tobacco: Never Used  . Alcohol use No  . Drug use: No  . Sexual activity: Yes    Partners: Male    Birth control/ protection: None     Comment: declined condoms   Other Topics Concern  . Not on file   Social History Narrative  . No narrative on file     REVIEW OF SYSTEMS:   [X]  denotes positive finding, [ ]  denotes negative finding Cardiac  Comments:  Chest pain or chest pressure:    Shortness of breath upon exertion:    Short of breath when lying flat:    Irregular heart rhythm: x       Vascular    Pain in calf, thigh, or hip brought on by ambulation: x   Pain in feet at night that wakes you up from your sleep:     Blood clot in your veins:    Leg swelling:  x       Pulmonary    Oxygen at home:    Productive cough:     Wheezing:         Neurologic    Sudden weakness in arms or legs:     Sudden numbness in arms or legs:     Sudden onset of difficulty speaking or slurred speech:    Temporary loss of vision in one eye:       Problems with  dizziness:         Gastrointestinal    Blood in stool:     Vomited blood:         Genitourinary    Burning when urinating:  x   Blood in urine: x       Psychiatric    Major depression:         Hematologic    Bleeding problems:    Problems with blood clotting too easily:        Skin    Rashes or ulcers:        Constitutional    Fever or chills:      PHYSICAL EXAMINATION:  Vitals:   10/18/15 1436  BP: 136/76  Pulse: (!) 55  Resp: 18  Temp: 98.9 F (37.2 C)   Body mass index is 43.47 kg/m.  General:  WDWN in NAD; vital signs documented above Gait: Not observed HENT: WNL, normocephalic Pulmonary: normal non-labored breathing , without Rales, rhonchi,  wheezing Cardiac: regular Skin: without rashes Vascular Exam/Pulses:  Right Left  DP 2+ (normal) 2+ (normal)   Extremities: without ischemic changes, without Gangrene , without cellulitis; without open wounds; + swelling LLE  Musculoskeletal: no muscle wasting or atrophy  Neurologic: A&O X 3;  No focal weakness or paresthesias are detected Psychiatric:  The pt has Normal affect.   Non-Invasive Vascular Imaging:   Lower extremity venous duplex reflux evaluation 10/14/15: 1.  No evidence of DVT in the left femoral-popliteal venous system 2.  No evidence of superficial vein thrombosis noted in LLE 3.  Venous incompetence noted in the left GSV as well as the CFV and femoral veins.   **The left distal thigh to knee level of great saphenous vein was not adequately visualized, which is consistent with a hx of left GSV laser ablation.    Pt meds includes: Statin:  No. Beta Blocker:  Yes.   Aspirin:  No. ACEI:  No. ARB:  No. Other Antiplatelet/Anticoagulant:  Yes.   Xarelto    ASSESSMENT/PLAN:: 55 y.o. female with hx of laser ablation of left GSV    -She has had laser ablation of the left GSV and there is an area proximally of the GSV with reflux, however, from the distal thigh to to knee was not  visualized due to the ablation. -she does not have any evidence of DVT -Dr. Donnetta Hutching does not feel that further laser ablation of the proximal GSV will give her relief from her sx. -she is instructed to continue compression and elevation. -if she gets swelling of a lower extremity, she will return for repeat u/s given her hx of DVT/PE in the past.   -she has been diagnosed with afib since her last visit and now on a beta blocker and was already on anticoagulation. -pt will return as needed.    Leontine Locket, PA-C [facility] [phone]  Clinic MD:  Pt seen and examined in conjunction with Dr. Donnetta Hutching  I have examined the patient, reviewed and agree with above. Only correctable venous pathology is a short segment in her proximal thigh of refluxing saphenous vein. Beta successfully ablated below this. Do not feel this would give any significant improvement and would not recommend treatment of the short segment. She was reassured and will continue conservative therapy  Curt Jews, MD 10/18/2015 3:50 PM

## 2015-11-30 ENCOUNTER — Encounter: Payer: Self-pay | Admitting: Radiology

## 2015-12-08 ENCOUNTER — Telehealth: Payer: Self-pay | Admitting: Radiology

## 2015-12-08 NOTE — Telephone Encounter (Signed)
Labs received from [facility], WNL CBC CMP sent for scanning

## 2015-12-13 ENCOUNTER — Other Ambulatory Visit (HOSPITAL_COMMUNITY): Payer: Self-pay | Admitting: Obstetrics and Gynecology

## 2015-12-13 DIAGNOSIS — E041 Nontoxic single thyroid nodule: Secondary | ICD-10-CM

## 2015-12-16 ENCOUNTER — Ambulatory Visit (HOSPITAL_COMMUNITY)
Admission: RE | Admit: 2015-12-16 | Discharge: 2015-12-16 | Disposition: A | Discharge status: Home / Self Care | Payer: 59 | Source: Ambulatory Visit | Attending: Obstetrics and Gynecology | Admitting: Obstetrics and Gynecology

## 2015-12-16 DIAGNOSIS — E041 Nontoxic single thyroid nodule: Secondary | ICD-10-CM

## 2015-12-27 ENCOUNTER — Telehealth (INDEPENDENT_AMBULATORY_CARE_PROVIDER_SITE_OTHER): Payer: Self-pay | Admitting: Orthopaedic Surgery

## 2015-12-27 NOTE — Telephone Encounter (Signed)
PATIENT IS REQUESTING A KNEE INJ FOR KNEE PAIN, SHE LEAVE FOR [place] NEXT Friday. CAN WE WORK HER IN WITH CB OR IS IT OK TO SEE ANOTHER DOCTOR WITH OPEN TIME ?  Cb# [phone]  (ok to leave detailed messages )

## 2015-12-28 NOTE — Telephone Encounter (Signed)
LMOM for patient letting her know we can work her in Tuesday if interested

## 2016-01-03 ENCOUNTER — Ambulatory Visit (INDEPENDENT_AMBULATORY_CARE_PROVIDER_SITE_OTHER): Payer: Self-pay | Admitting: Orthopaedic Surgery

## 2016-01-25 ENCOUNTER — Encounter (HOSPITAL_COMMUNITY): Payer: Self-pay | Admitting: Emergency Medicine

## 2016-01-25 ENCOUNTER — Emergency Department (HOSPITAL_COMMUNITY): Payer: 59

## 2016-01-25 ENCOUNTER — Inpatient Hospital Stay (HOSPITAL_COMMUNITY)
Admission: EM | Admit: 2016-01-25 | Discharge: 2016-01-30 | DRG: 863 | Disposition: A | Discharge status: Home / Self Care | Payer: 59 | Attending: Internal Medicine | Admitting: Internal Medicine

## 2016-01-25 DIAGNOSIS — I4891 Unspecified atrial fibrillation: Secondary | ICD-10-CM

## 2016-01-25 DIAGNOSIS — Z86711 Personal history of pulmonary embolism: Secondary | ICD-10-CM | POA: Diagnosis not present

## 2016-01-25 DIAGNOSIS — Z7901 Long term (current) use of anticoagulants: Secondary | ICD-10-CM | POA: Diagnosis not present

## 2016-01-25 DIAGNOSIS — E1121 Type 2 diabetes mellitus with diabetic nephropathy: Secondary | ICD-10-CM

## 2016-01-25 DIAGNOSIS — Z880 Allergy status to penicillin: Secondary | ICD-10-CM | POA: Diagnosis not present

## 2016-01-25 DIAGNOSIS — E669 Obesity, unspecified: Secondary | ICD-10-CM | POA: Diagnosis present

## 2016-01-25 DIAGNOSIS — K219 Gastro-esophageal reflux disease without esophagitis: Secondary | ICD-10-CM | POA: Diagnosis present

## 2016-01-25 DIAGNOSIS — Q211 Atrial septal defect, unspecified: Secondary | ICD-10-CM

## 2016-01-25 DIAGNOSIS — Z833 Family history of diabetes mellitus: Secondary | ICD-10-CM | POA: Diagnosis not present

## 2016-01-25 DIAGNOSIS — Z8261 Family history of arthritis: Secondary | ICD-10-CM | POA: Diagnosis not present

## 2016-01-25 DIAGNOSIS — Z91041 Radiographic dye allergy status: Secondary | ICD-10-CM | POA: Diagnosis not present

## 2016-01-25 DIAGNOSIS — B9620 Unspecified Escherichia coli [E. coli] as the cause of diseases classified elsewhere: Secondary | ICD-10-CM | POA: Diagnosis present

## 2016-01-25 DIAGNOSIS — Z841 Family history of disorders of kidney and ureter: Secondary | ICD-10-CM | POA: Diagnosis not present

## 2016-01-25 DIAGNOSIS — S301XXA Contusion of abdominal wall, initial encounter: Secondary | ICD-10-CM

## 2016-01-25 DIAGNOSIS — Z9889 Other specified postprocedural states: Secondary | ICD-10-CM | POA: Diagnosis not present

## 2016-01-25 DIAGNOSIS — K5730 Diverticulosis of large intestine without perforation or abscess without bleeding: Secondary | ICD-10-CM | POA: Diagnosis present

## 2016-01-25 DIAGNOSIS — D62 Acute posthemorrhagic anemia: Secondary | ICD-10-CM | POA: Diagnosis present

## 2016-01-25 DIAGNOSIS — Z86718 Personal history of other venous thrombosis and embolism: Secondary | ICD-10-CM | POA: Diagnosis not present

## 2016-01-25 DIAGNOSIS — L7632 Postprocedural hematoma of skin and subcutaneous tissue following other procedure: Secondary | ICD-10-CM | POA: Diagnosis present

## 2016-01-25 DIAGNOSIS — T148XXA Other injury of unspecified body region, initial encounter: Secondary | ICD-10-CM

## 2016-01-25 DIAGNOSIS — E1151 Type 2 diabetes mellitus with diabetic peripheral angiopathy without gangrene: Secondary | ICD-10-CM | POA: Diagnosis present

## 2016-01-25 DIAGNOSIS — Z9884 Bariatric surgery status: Secondary | ICD-10-CM

## 2016-01-25 DIAGNOSIS — I82409 Acute embolism and thrombosis of unspecified deep veins of unspecified lower extremity: Secondary | ICD-10-CM

## 2016-01-25 DIAGNOSIS — E118 Type 2 diabetes mellitus with unspecified complications: Secondary | ICD-10-CM | POA: Diagnosis not present

## 2016-01-25 DIAGNOSIS — I2699 Other pulmonary embolism without acute cor pulmonale: Secondary | ICD-10-CM | POA: Diagnosis present

## 2016-01-25 DIAGNOSIS — N1330 Unspecified hydronephrosis: Secondary | ICD-10-CM | POA: Diagnosis not present

## 2016-01-25 DIAGNOSIS — I82402 Acute embolism and thrombosis of unspecified deep veins of left lower extremity: Secondary | ICD-10-CM | POA: Diagnosis not present

## 2016-01-25 DIAGNOSIS — I480 Paroxysmal atrial fibrillation: Secondary | ICD-10-CM

## 2016-01-25 DIAGNOSIS — Z6841 Body Mass Index (BMI) 40.0 and over, adult: Secondary | ICD-10-CM

## 2016-01-25 DIAGNOSIS — Y838 Other surgical procedures as the cause of abnormal reaction of the patient, or of later complication, without mention of misadventure at the time of the procedure: Secondary | ICD-10-CM | POA: Diagnosis present

## 2016-01-25 DIAGNOSIS — IMO0001 Reserved for inherently not codable concepts without codable children: Secondary | ICD-10-CM

## 2016-01-25 DIAGNOSIS — I482 Chronic atrial fibrillation: Secondary | ICD-10-CM | POA: Diagnosis not present

## 2016-01-25 DIAGNOSIS — Z8249 Family history of ischemic heart disease and other diseases of the circulatory system: Secondary | ICD-10-CM | POA: Diagnosis not present

## 2016-01-25 DIAGNOSIS — L089 Local infection of the skin and subcutaneous tissue, unspecified: Secondary | ICD-10-CM | POA: Diagnosis present

## 2016-01-25 DIAGNOSIS — E785 Hyperlipidemia, unspecified: Secondary | ICD-10-CM | POA: Diagnosis present

## 2016-01-25 DIAGNOSIS — T814XXA Infection following a procedure, initial encounter: Secondary | ICD-10-CM | POA: Diagnosis not present

## 2016-01-25 DIAGNOSIS — N1339 Other hydronephrosis: Secondary | ICD-10-CM | POA: Diagnosis present

## 2016-01-25 DIAGNOSIS — E119 Type 2 diabetes mellitus without complications: Secondary | ICD-10-CM

## 2016-01-25 DIAGNOSIS — S3692XA Contusion of unspecified intra-abdominal organ, initial encounter: Secondary | ICD-10-CM

## 2016-01-25 DIAGNOSIS — I82401 Acute embolism and thrombosis of unspecified deep veins of right lower extremity: Secondary | ICD-10-CM | POA: Diagnosis not present

## 2016-01-25 LAB — URINALYSIS, ROUTINE W REFLEX MICROSCOPIC
Bilirubin Urine: NEGATIVE
GLUCOSE, UA: NEGATIVE mg/dL
Hgb urine dipstick: NEGATIVE
KETONES UR: NEGATIVE mg/dL
LEUKOCYTES UA: NEGATIVE
NITRITE: NEGATIVE
PROTEIN: NEGATIVE mg/dL
Specific Gravity, Urine: 1.014 (ref 1.005–1.030)
pH: 5 (ref 5.0–8.0)

## 2016-01-25 LAB — COMPREHENSIVE METABOLIC PANEL
ALBUMIN: 2.9 g/dL — AB (ref 3.5–5.0)
ALK PHOS: 59 U/L (ref 38–126)
ALT: 15 U/L (ref 14–54)
AST: 17 U/L (ref 15–41)
Anion gap: 9 (ref 5–15)
BILIRUBIN TOTAL: 1.7 mg/dL — AB (ref 0.3–1.2)
BUN: 19 mg/dL (ref 6–20)
CALCIUM: 8.4 mg/dL — AB (ref 8.9–10.3)
CO2: 22 mmol/L (ref 22–32)
Chloride: 104 mmol/L (ref 101–111)
Creatinine, Ser: 0.79 mg/dL (ref 0.44–1.00)
GFR calc Af Amer: >60 mL/min (ref >60)
GFR calc non Af Amer: >60 mL/min (ref >60)
GLUCOSE: 116 mg/dL — AB (ref 65–99)
Potassium: 3.6 mmol/L (ref 3.5–5.1)
Sodium: 135 mmol/L (ref 135–145)
TOTAL PROTEIN: 5.7 g/dL — AB (ref 6.5–8.1)

## 2016-01-25 LAB — LACTIC ACID, PLASMA
LACTIC ACID, VENOUS: 1.1 mmol/L (ref 0.5–1.9)
LACTIC ACID, VENOUS: 1.3 mmol/L (ref 0.5–1.9)

## 2016-01-25 LAB — CBC WITH DIFFERENTIAL/PLATELET
BASOS ABS: 0 10*3/uL (ref 0.0–0.1)
BASOS PCT: 0 %
EOS ABS: 0 10*3/uL (ref 0.0–0.7)
EOS PCT: 0 %
HCT: 32 % — ABNORMAL LOW (ref 36.0–46.0)
Hemoglobin: 10.1 g/dL — ABNORMAL LOW (ref 12.0–15.0)
Lymphocytes Relative: 11 %
Lymphs Abs: 1.2 10*3/uL (ref 0.7–4.0)
MCH: 26.3 pg (ref 26.0–34.0)
MCHC: 31.6 g/dL (ref 30.0–36.0)
MCV: 83.3 fL (ref 78.0–100.0)
MONO ABS: 0.7 10*3/uL (ref 0.1–1.0)
Monocytes Relative: 6 %
Neutro Abs: 9.1 10*3/uL — ABNORMAL HIGH (ref 1.7–7.7)
Neutrophils Relative %: 83 %
PLATELETS: 223 10*3/uL (ref 150–400)
RBC: 3.84 MIL/uL — ABNORMAL LOW (ref 3.87–5.11)
RDW: 18.3 % — AB (ref 11.5–15.5)
WBC: 11 10*3/uL — ABNORMAL HIGH (ref 4.0–10.5)

## 2016-01-25 LAB — I-STAT CG4 LACTIC ACID, ED
LACTIC ACID, VENOUS: 2.3 mmol/L — AB (ref 0.5–1.9)
Lactic Acid, Venous: 2.09 mmol/L (ref 0.5–1.9)

## 2016-01-25 LAB — PROTIME-INR
INR: 1.58
Prothrombin Time: 19.1 seconds — ABNORMAL HIGH (ref 11.4–15.2)

## 2016-01-25 LAB — TROPONIN I
Troponin I: <0.03 ng/mL (ref <0.03)
Troponin I: <0.03 ng/mL (ref <0.03)

## 2016-01-25 LAB — GLUCOSE, CAPILLARY
GLUCOSE-CAPILLARY: 149 mg/dL — AB (ref 65–99)
Glucose-Capillary: 95 mg/dL (ref 65–99)

## 2016-01-25 LAB — ABO/RH: ABO/RH(D): O POS

## 2016-01-25 LAB — MRSA PCR SCREENING: MRSA by PCR: NEGATIVE

## 2016-01-25 MED ORDER — TRAZODONE HCL 50 MG PO TABS: 25.0000 mg | ORAL_TABLET | Freq: Every evening | ORAL | Status: DC | PRN

## 2016-01-25 MED ORDER — SODIUM CHLORIDE 0.9 % IV BOLUS (SEPSIS)
1000.0000 mL | Freq: Once | INTRAVENOUS | Status: AC
  Administered 2016-01-25: 1000 mL via INTRAVENOUS

## 2016-01-25 MED ORDER — SODIUM CHLORIDE 0.9% FLUSH
3.0000 mL | Freq: Two times a day (BID) | INTRAVENOUS | Status: DC
  Administered 2016-01-26 – 2016-01-29 (×6): 3 mL via INTRAVENOUS

## 2016-01-25 MED ORDER — PIPERACILLIN-TAZOBACTAM 3.375 G IVPB
3.3750 g | Freq: Three times a day (TID) | INTRAVENOUS | Status: DC
  Administered 2016-01-25 – 2016-01-30 (×14): 3.375 g via INTRAVENOUS
  Filled 2016-01-25 (×15): qty 50

## 2016-01-25 MED ORDER — BISACODYL 10 MG RE SUPP: 10.0000 mg | Freq: Every day | RECTAL | Status: DC | PRN

## 2016-01-25 MED ORDER — INSULIN ASPART 100 UNIT/ML ~~LOC~~ SOLN: 0.0000 [IU] | Freq: Three times a day (TID) | SUBCUTANEOUS | Status: DC

## 2016-01-25 MED ORDER — ONDANSETRON HCL 4 MG/2ML IJ SOLN: 4.0000 mg | Freq: Three times a day (TID) | INTRAMUSCULAR | Status: AC | PRN

## 2016-01-25 MED ORDER — SODIUM CHLORIDE 0.9 % IV SOLN
INTRAVENOUS | Status: DC
  Administered 2016-01-25 – 2016-01-29 (×8): via INTRAVENOUS

## 2016-01-25 MED ORDER — HYDROCODONE-ACETAMINOPHEN 5-325 MG PO TABS
1.0000 | ORAL_TABLET | ORAL | Status: DC | PRN
  Administered 2016-01-29: 1 via ORAL
  Administered 2016-01-29: 2 via ORAL
  Filled 2016-01-25 (×2): qty 2

## 2016-01-25 MED ORDER — ACETAMINOPHEN 650 MG RE SUPP: 650.0000 mg | Freq: Four times a day (QID) | RECTAL | Status: DC | PRN

## 2016-01-25 MED ORDER — INFLUENZA VAC SPLIT QUAD 0.5 ML IM SUSY
0.5000 mL | PREFILLED_SYRINGE | INTRAMUSCULAR | Status: AC
  Administered 2016-01-30: 0.5 mL via INTRAMUSCULAR

## 2016-01-25 MED ORDER — SENNOSIDES-DOCUSATE SODIUM 8.6-50 MG PO TABS: 1.0000 | ORAL_TABLET | Freq: Every evening | ORAL | Status: DC | PRN

## 2016-01-25 MED ORDER — ACETAMINOPHEN 325 MG PO TABS
650.0000 mg | ORAL_TABLET | Freq: Four times a day (QID) | ORAL | Status: DC | PRN
  Administered 2016-01-25 – 2016-01-29 (×4): 650 mg via ORAL
  Filled 2016-01-25 (×3): qty 2

## 2016-01-25 MED ORDER — ESCITALOPRAM OXALATE 10 MG PO TABS
10.0000 mg | ORAL_TABLET | Freq: Every day | ORAL | Status: DC
  Filled 2016-01-25: qty 1

## 2016-01-25 MED ORDER — METOPROLOL TARTRATE 12.5 MG HALF TABLET
12.5000 mg | ORAL_TABLET | Freq: Two times a day (BID) | ORAL | Status: DC
  Administered 2016-01-26 – 2016-01-30 (×8): 12.5 mg via ORAL
  Filled 2016-01-25 (×9): qty 1

## 2016-01-25 MED ORDER — MAGNESIUM CITRATE PO SOLN: 1.0000 | Freq: Once | ORAL | Status: DC | PRN

## 2016-01-25 MED ORDER — VANCOMYCIN HCL 10 G IV SOLR
2500.0000 mg | Freq: Once | INTRAVENOUS | Status: AC
  Administered 2016-01-25: 2500 mg via INTRAVENOUS
  Filled 2016-01-25: qty 2500

## 2016-01-25 MED ORDER — ONDANSETRON HCL 4 MG/2ML IJ SOLN: 4.0000 mg | Freq: Three times a day (TID) | INTRAMUSCULAR | Status: DC | PRN

## 2016-01-25 MED ORDER — VANCOMYCIN HCL IN DEXTROSE 1-5 GM/200ML-% IV SOLN
1000.0000 mg | Freq: Two times a day (BID) | INTRAVENOUS | Status: DC
  Administered 2016-01-26 – 2016-01-28 (×5): 1000 mg via INTRAVENOUS
  Filled 2016-01-25 (×6): qty 200

## 2016-01-25 MED ORDER — PIPERACILLIN-TAZOBACTAM 3.375 G IVPB 30 MIN
3.3750 g | Freq: Once | INTRAVENOUS | Status: AC
  Administered 2016-01-25: 3.375 g via INTRAVENOUS
  Filled 2016-01-25: qty 50

## 2016-01-25 NOTE — Progress Notes (Addendum)
Pharmacy Antibiotic Note  [name] is a 54 y.o. female admitted on 01/25/2016 with wound infection s/p abdominoplasty in [place] on 12/11. She is now febrile to 101.5 with dark blood draining from the RLQ. Creatinine wnl, normalized crcl ~70 mL/min (creatinine rounded to 1.00). WBC elevated. Patient has penicillin allergy (itchy rash as a child), but has tolerated the first dose of zosyn well.  Plan: Give vancomycin 2500mg  x1 Start vancomycin 1000mg  IV q12h  Start zosyn 3.375g IV q8h Monitor clinical status, length of therapy, c/s, and ability to de-escalate Check VT as indicated  Height: 5\' 6"  (167.6 cm) Weight: 252 lb (114.3 kg) IBW/kg (Calculated) : 59.3  Temp (24hrs), Avg:100.8 F (38.2 C), Min:100.8 F (38.2 C), Max:100.8 F (38.2 C)  CBC    Component Value Date/Time   WBC 11.0 (H) 01/25/2016 1127   RBC 3.84 (L) 01/25/2016 1127   HGB 10.1 (L) 01/25/2016 1127   HCT 32.0 (L) 01/25/2016 1127   PLT 223 01/25/2016 1127   MCV 83.3 01/25/2016 1127   MCH 26.3 01/25/2016 1127   MCHC 31.6 01/25/2016 1127   RDW 18.3 (H) 01/25/2016 1127   LYMPHSABS 1.2 01/25/2016 1127   MONOABS 0.7 01/25/2016 1127   EOSABS 0.0 01/25/2016 1127   BASOSABS 0.0 01/25/2016 1127    BMET    Component Value Date/Time   NA 135 01/25/2016 1127   K 3.6 01/25/2016 1127   CL 104 01/25/2016 1127   CO2 22 01/25/2016 1127   GLUCOSE 116 (H) 01/25/2016 1127   BUN 19 01/25/2016 1127   CREATININE 0.79 01/25/2016 1127   CREATININE 0.63 08/24/2010 1107   CALCIUM 8.4 (L) 01/25/2016 1127   GFRNONAA >60 01/25/2016 1127   GFRAA >60 01/25/2016 1127    Allergies  Allergen Reactions  . Ancef [Cefazolin Sodium] Nausea And Vomiting  . Contrast Media [Iodinated Diagnostic Agents]     apnea  . Penicillins Other (See Comments)    Patient does not recall the reaction, it happened when she was a child    Antimicrobials this admission: Vancomycin 12/20 >>  Zosyn 12/20 >>   Dose adjustments this  admission:  Microbiology results: 12/20 BCx: Sent   Thank you for allowing pharmacy to be a part of this patient's care.  Dierdre Harness, [place], PharmD Clinical Pharmacy Resident [phone] (Pager) 01/25/2016 1:01 PM

## 2016-01-25 NOTE — ED Notes (Signed)
Attempted report 

## 2016-01-25 NOTE — ED Provider Notes (Signed)
[facility] DEPT Provider Note   CSN: [id number] Arrival date & time: 01/25/16  1104     History   Chief Complaint Chief Complaint  Patient presents with  . Post-op Problem    HPI [name] is a 55 y.o. female.  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 Patient went to [place] and had an abdominoplasty performed. This was complicated by postop bleeding/hematoma. Patient states that she had several drainages prior to leaving. Patient flew home and arrived this morning. She has been very lightheaded and is having difficulty standing. She is very fatigued and generally weak. She developed a fever and chills yesterday. No N/V/D, urinary sx. No CP or SOB. She was on injectable blood thinner in the hospital and has resumed Xarelto (hasn't had today). She needed blood transfusion in past for GI bleed. She is a former Agricultural consultant. The onset of this condition was acute. The course is constant. Aggravating factors: none. Alleviating factors: none.        Past Medical History:  Diagnosis Date  . Anemia   . Arthritis   . Atrial fibrillation ([place])   . Bleeding ulcer 12-2010  hospitalized for 1 week  . Blood transfusion   . Blood transfusion without reported diagnosis    2011-2 units transfused, bleeding ulcer  . Bruises easily   . Constipation   . Diabetes mellitus    Pt had DM prior to Lap Band. Does not have it now per pt.  . Difficulty in swallowing 03-24-13   no longer a problem  . DVT (deep venous thrombosis) ([place]) 06-2013   right leg  . GERD (gastroesophageal reflux disease)   . Hyperlipidemia   . Leg swelling 03-24-13   not a problem any longer  . Peripheral vascular disease ([place])   . Pulmonary embolus ([place]) 06-2013  . Reflux   . Shortness of breath   . Vomiting 03-24-13   problem resolved after lap band revision    Patient Active Problem List   Diagnosis Date Noted  . Varicose veins of lower  extremities with complications 99991111  . Pain and swelling of left lower leg 10/27/2013  . Pain of left lower extremity 10/27/2013  . Gastroenteritis, acute 09/08/2013  . Fever, unspecified 09/04/2013  . ASD (atrial septal defect) 08/13/2013  . Pulmonary embolism ([place]) 06/22/2013  . Dyspnea 06/11/2013  . Anterior slip repaired Oct 2012 01/18/2011  . GI bleed 12/18/2010  . Diabetes mellitus ([place]) 12/18/2010  . Varicose veins of lower extremities with other complications 99991111  . Diabetes mellitus   . HIP PAIN, RIGHT 03/06/2010    Past Surgical History:  Procedure Laterality Date  . APPENDECTOMY    . CARPEL TUNNEL BILATEREAL    . CHOLECYSTECTOMY     5'12  . COLONOSCOPY N/A 04/10/2013   Procedure: COLONOSCOPY;  Surgeon: Beryle Beams, MD;  Location: [facility] ENDOSCOPY;  Service: Endoscopy;  Laterality: N/A;  . DIAGNOSTIC LAPAROSCOPY    . ENDOVENOUS ABLATION SAPHENOUS VEIN W/ LASER  06-2008  . ENDOVENOUS ABLATION SAPHENOUS VEIN W/ LASER Left 12-09-2013   EVLA LEFT GREATER SAPHENOUS VEIN BY TODD EARLY MD  . ESOPHAGOGASTRODUODENOSCOPY  12/18/2010   Procedure: ESOPHAGOGASTRODUODENOSCOPY (EGD);  Surgeon: Gatha Mayer, MD;  Location: [facility];  Service: Endoscopy;  Laterality: N/A;  . LAPAROSCOPIC GASTRIC BANDING  01-05-2008  . LAPAROSCOPIC GASTRIC BANDING  12/18/2010   01/04/2009  . SALPHINGOOOPHORECTOMY Right    cyst removal  .  TONSILLECTOMY    . WEDGE RESECTION OF OVARY  LEFT OVARY     OB History    No data available       Home Medications    Prior to Admission medications   Medication Sig Start Date End Date Taking? Authorizing Provider  acetaminophen (TYLENOL) 500 MG tablet Take 500 mg by mouth as needed for mild pain.     Historical Provider, MD  Biotin 10000 MCG TABS Take by mouth daily.    Historical Provider, MD  celecoxib (CELEBREX) 200 MG capsule Take 200 mg by mouth as needed for mild pain.     Historical Provider, MD  Cholecalciferol (VITAMIN D HIGH  POTENCY) 1000 units capsule Take 1,000 Units by mouth daily.    Historical Provider, MD  docusate sodium (COLACE) 100 MG capsule Take 200 mg by mouth daily.     Historical Provider, MD  escitalopram (LEXAPRO) 10 MG tablet Take 10 mg by mouth daily.    Historical Provider, MD  Linaclotide Rolan Lipa) 145 MCG CAPS capsule Take 145 mcg by mouth daily as needed (for IBS pain).    Historical Provider, MD  metoprolol tartrate (LOPRESSOR) 25 MG tablet Take 0.5 tablets (12.5 mg total) by mouth 2 (two) times daily. 06/13/15   Thayer Headings, MD  Multiple Vitamins-Minerals (MULTIVITAMINS THER. W/MINERALS) TABS Take 1 tablet by mouth daily.      Historical Provider, MD  omeprazole (PRILOSEC) 20 MG capsule Take 20 mg by mouth 2 (two) times daily before a meal.    Historical Provider, MD  Pseudoephedrine-APAP-DM (DAYQUIL PO) Take 15 mLs by mouth daily as needed (cold symptoms).    Historical Provider, MD  XARELTO 20 MG TABS tablet TAKE 1 TABLET BY MOUTH ONCE DAILY WITH SUPPER 07/28/15   Chesley Mires, MD    Family History Family History  Problem Relation Age of Onset  . Heart disease Father   . Kidney disease Father   . Peripheral vascular disease Father   . Heart attack Father     61  . Diabetes Mother   . Arthritis Mother     Social History Social History  Substance Use Topics  . Smoking status: Never Smoker  . Smokeless tobacco: Never Used  . Alcohol use No     Allergies   Ancef [cefazolin sodium]; Contrast media [iodinated diagnostic agents]; and Penicillins   Review of Systems Review of Systems  Constitutional: Positive for fatigue and fever.  HENT: Negative for rhinorrhea and sore throat.   Eyes: Negative for redness.  Respiratory: Negative for cough.   Cardiovascular: Negative for chest pain.  Gastrointestinal: Positive for abdominal pain. Negative for diarrhea, nausea and vomiting.  Genitourinary: Negative for dysuria.  Musculoskeletal: Negative for myalgias.  Skin: Positive for  color change, pallor and wound. Negative for rash.  Neurological: Positive for weakness. Negative for headaches.     Physical Exam Updated Vital Signs BP 119/66 (BP Location: Right Arm)   Pulse 96   Temp 100.8 F (38.2 C) (Oral)   Resp 20   Ht 5\' 6"  (1.676 m)   Wt 114.3 kg   SpO2 98%   BMI 40.67 kg/m   Physical Exam  Constitutional: She appears well-developed and well-nourished.  HENT:  Head: Normocephalic and atraumatic.  Mouth/Throat: Oropharynx is clear and moist.  Eyes: Conjunctivae are normal. Right eye exhibits no discharge. Left eye exhibits no discharge.  Pale conjunctiva  Neck: Normal range of motion. Neck supple.  Cardiovascular: Normal rate, regular rhythm and normal heart  sounds.   No murmur heard. Pulmonary/Chest: Effort normal and breath sounds normal. No respiratory distress. She has no wheezes. She has no rales.  Abdominal: Soft. She exhibits no mass. There is tenderness.  Patient with long transverse wound, sutured, along lower abdomen. There is minimal tenderness. There is no active drainage.   Neurological: She is alert.  Skin: Skin is warm and dry. There is pallor.  Psychiatric: She has a normal mood and affect.  Nursing note and vitals reviewed.    ED Treatments / Results  Labs (all labs ordered are listed, but only abnormal results are displayed) Labs Reviewed  COMPREHENSIVE METABOLIC PANEL - Abnormal; Notable for the following:       Result Value   Glucose, Bld 116 (*)    Calcium 8.4 (*)    Total Protein 5.7 (*)    Albumin 2.9 (*)    Total Bilirubin 1.7 (*)    All other components within normal limits  CBC WITH DIFFERENTIAL/PLATELET - Abnormal; Notable for the following:    WBC 11.0 (*)    RBC 3.84 (*)    Hemoglobin 10.1 (*)    HCT 32.0 (*)    RDW 18.3 (*)    Neutro Abs 9.1 (*)    All other components within normal limits  PROTIME-INR - Abnormal; Notable for the following:    Prothrombin Time 19.1 (*)    All other components within  normal limits  I-STAT CG4 LACTIC ACID, ED - Abnormal; Notable for the following:    Lactic Acid, Venous 2.09 (*)    All other components within normal limits  I-STAT CG4 LACTIC ACID, ED - Abnormal; Notable for the following:    Lactic Acid, Venous 2.30 (*)    All other components within normal limits  CULTURE, BLOOD (ROUTINE X 2)  CULTURE, BLOOD (ROUTINE X 2)  URINE CULTURE  URINALYSIS, ROUTINE W REFLEX MICROSCOPIC  TYPE AND SCREEN  ABO/RH    Radiology Ct Abdomen Pelvis Wo Contrast  Result Date: 01/25/2016 CLINICAL DATA:  Recent abdominal plasty with fever and diffuse lower abdominal pain EXAM: CT ABDOMEN AND PELVIS WITHOUT CONTRAST TECHNIQUE: Multidetector CT imaging of the abdomen and pelvis was performed following the standard protocol without IV contrast. COMPARISON:  01/05/2016 FINDINGS: Lower chest: The lung bases demonstrate minimal atelectasis. No acute consolidation or pleural effusion is visualized. Heart size is nonenlarged. Hepatobiliary: No focal hepatic abnormality. Surgical clips in the gallbladder fossa. No intra hepatic biliary dilatation. Mildly prominent extrahepatic common bile duct at the porta hepatis is fell postsurgical. Pancreas: Unremarkable. No pancreatic ductal dilatation or surrounding inflammatory changes. Spleen: Normal in size without focal abnormality. Adrenals/Urinary Tract: Adrenal glands are within normal limits. Dilated right extrarenal pelvis. Mild left hydronephrosis. There is a 3-4 mm stone within the lower left kidney. There is no evidence for ureteral stone. The bladder is enlarged. Stomach/Bowel: Stomach demonstrates evidence of prior laparoscopic banding. There is a small to moderate hiatal hernia. No evidence for dilated small bowel. Moderate stool in the colon. No colon wall thickening. Diverticular disease of the sigmoid colon without acute inflammation. Vascular/Lymphatic: Atherosclerosis of the aorta. No significantly enlarged lymph nodes.  Reproductive: Uterus and bilateral adnexa are unremarkable. Other: Extensive fluid and soft tissue stranding within the infraumbilical abdominal wall. Multifocal areas of increased attenuation suggestive of hemorrhage. Multiple pockets of gas present within the subcutaneous with more focal gas collection within the subcutaneous fat of the right lower quadrant. Small pockets of subcutaneous gas present deep to the umbilicus, soft  tissue thickening is present at the umbilicus. No free air Musculoskeletal: There are degenerative changes of the spine. No acute osseous abnormality. IMPRESSION: 1. Extensive fluid and soft tissue stranding/edema within the subcutaneous fat of the infraumbilical anterior and lateral abdominal wall. There are multifocal pockets of gas within the subcutaneous fat. Areas of increased density are present within the fluid and fat stranding suggestive of hematoma. Findings could be secondary to recent abdominal plasty, however the presence of infection cannot be ruled out. 2. Punctate stone in the left kidney as before. Dilated right extrarenal pelvis with mild left hydronephrosis, no evidence for ureteral stone. Findings could be secondary to marked bladder distention 3. No acute inflammatory process is visualized within the abdominal or pelvic cavity 4. Sigmoid colon diverticular disease without acute inflammation 5. Stable gastric banding.  No evidence for obstruction. Electronically Signed   By: Donavan Foil M.D.   On: 01/25/2016 14:50   Dg Chest 2 View  Result Date: 01/25/2016 CLINICAL DATA:  Fever.  Recent surgery. EXAM: CHEST  2 VIEW COMPARISON:  05/04/2015 FINDINGS: The heart size and mediastinal contours are within normal limits. Both lungs are clear. The visualized skeletal structures are unremarkable. IMPRESSION: No active cardiopulmonary disease. Electronically Signed   By: Franchot Gallo M.D.   On: 01/25/2016 13:25    Procedures Procedures (including critical care  time)  Medications Ordered in ED Medications  piperacillin-tazobactam (ZOSYN) IVPB 3.375 g (not administered)  vancomycin (VANCOCIN) IVPB 1000 mg/200 mL premix (not administered)  sodium chloride 0.9 % bolus 1,000 mL (not administered)  vancomycin (VANCOCIN) 2,500 mg in sodium chloride 0.9 % 500 mL IVPB (2,500 mg Intravenous New Bag/Given 01/25/16 1321)  piperacillin-tazobactam (ZOSYN) IVPB 3.375 g (0 g Intravenous Stopped 01/25/16 1321)     Initial Impression / Assessment and Plan / ED Course  I have reviewed the triage vital signs and the nursing notes.  Pertinent labs & imaging results that were available during my care of the patient were reviewed by me and considered in my medical decision making (see chart for details).  Clinical Course    Patient seen and examined. Work-up initiated. Medications ordered. Lactic acid only slightly elevated.   Vital signs reviewed and are as follows: BP 119/66 (BP Location: Right Arm)   Pulse 96   Temp 100.8 F (38.2 C) (Oral)   Resp 20   Ht 5\' 6"  (1.676 m)   Wt 114.3 kg   SpO2 98%   BMI 40.67 kg/m   Pt discussed with and seen by Dr. Laverta Baltimore.   Discussed with [place] PA-C of general surgery who will see patient.   Final Clinical Impressions(s) / ED Diagnoses   Final diagnoses:  Wound infection after surgery, initial encounter   Admit.   New Prescriptions New Prescriptions   No medications on file     Carlisle Cater, PA-C 01/25/16 [address] [place] 01/25/16 [place], MD 01/25/16 2101

## 2016-01-25 NOTE — ED Notes (Signed)
General Surgery PA at bedside 

## 2016-01-25 NOTE — ED Triage Notes (Signed)
Pt in from [place] via [facility] with c/o post-op problems after tummy tuck. Pt had surgery done in [place] on 12/11. Hx of PE, takes Xarelto. Per EMS, on their arrival, pt was pale, weak and had fever of 101.5. Lap site present at umbilicus, lower abd latitudinal incision present with glue. Dark blood drainage in RLQ. Pt reports dizziness, was given 223ml's NS for BP of 74/50 for EMS. Last BM 12/13, last void 12/19. Alert, a&ox4.

## 2016-01-25 NOTE — Consult Note (Signed)
[facility] Surgery Consult/Admission Note  [name] Oct 30, 1960  [id number].    Requesting MD: Dr. 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 Patient states she was discharged from the [facility] on 12/12 and had continued postop bleeding. She states that they had drained a possible hematoma in her abdomen several times prior to leaving. Patient took a flight home last night and arrived this morning. She is complaining of being very lightheaded, weak, presyncope, fatigue. She states she started having subjective fever last night and today. Patient is not complaining of any abdominal pain at this time. She is having mild incisional discomfort. Pt states she was having right sided back pain on the plane home. No back pain at rest currently. No nausea, vomiting, diarrhea, urinary symptoms, no chest pain, shortness of breath, cough, sore throat. Last dose of Xarelto was yesterday.   ED course: Hg 10.1, WBC 11.0, lactic acid 2.30, T100.8. CT abd: Extensive fluid and soft tissue stranding/edema within the subcutaneous fat of the infraumbilical anterior and lateral abdominal wall. There are multifocal pockets of gas within the subcutaneous fat. Areas of increased density are present within the fluid and fat stranding suggestive of hematoma. Findings could be secondary to recent abdominal plasty, however the presence of infection cannot be ruled out. No acute inflammatory process is visualized within the abdominal or pelvic cavity Pt was given fluids in the ED and pharmacy was consulted for antibiotic therapy their recommendations.... - Give vancomycin 2578m x1 - Start vancomycin 10074mIV q12h  - Start zosyn 3.375g IV  q8h  ROS:  Review of Systems  Constitutional: Positive for chills, fever and malaise/fatigue. Negative for diaphoresis.  HENT: Negative for congestion and sore throat.   Eyes: Negative for discharge.  Respiratory: Negative for cough and shortness of breath.   Cardiovascular: Negative for chest pain and leg swelling.  Gastrointestinal: Negative for abdominal pain, blood in stool, constipation, diarrhea, nausea and vomiting.  Genitourinary: Negative for dysuria, frequency, hematuria and urgency.  Musculoskeletal: Positive for back pain. Negative for myalgias.  Skin: Negative for itching and rash.  Neurological: Positive for dizziness. Negative for speech change, seizures, loss of consciousness and headaches.  All other systems reviewed and are negative.    Family History  Problem Relation Age of Onset  . Heart disease Father   . Kidney disease Father   . Peripheral vascular disease Father   . Heart attack Father     1965. Diabetes Mother   . Arthritis Mother     Past Medical History:  Diagnosis Date  . Anemia   . Arthritis   . [place]  . Bleeding ulcer 12-2010  hospitalized for 1 week  . Blood transfusion   . Blood transfusion without reported diagnosis    2011-2 units transfused, bleeding ulcer  . Bruises easily   . Constipation   . Diabetes mellitus    Pt had DM prior to Lap Band. Does not have it now per pt.  . Difficulty in swallowing 03-24-13   no longer a problem  . [place]   right [place]  . GERD (gastroesophageal reflux disease)   . Hyperlipidemia   . Leg swelling 03-24-13   not a problem  any longer  . Peripheral vascular disease ([place])   . Pulmonary embolus ([place]) 06-2013  . Reflux   . Shortness of breath   . Vomiting 03-24-13   problem resolved after lap band revision    Past Surgical History:  Procedure Laterality Date  . APPENDECTOMY    . CARPEL TUNNEL BILATEREAL    . CHOLECYSTECTOMY     5'12  .  COLONOSCOPY N/A 04/10/2013   Procedure: COLONOSCOPY;  Surgeon: Beryle Beams, MD;  Location: [facility];  Service: Endoscopy;  Laterality: N/A;  . DIAGNOSTIC LAPAROSCOPY    . ENDOVENOUS ABLATION SAPHENOUS VEIN W/ LASER  06-2008  . ENDOVENOUS ABLATION SAPHENOUS VEIN W/ LASER Left 12-09-2013   EVLA LEFT GREATER SAPHENOUS VEIN BY TODD EARLY MD  . ESOPHAGOGASTRODUODENOSCOPY  12/18/2010   Procedure: ESOPHAGOGASTRODUODENOSCOPY (EGD);  Surgeon: Gatha Mayer, MD;  Location: [facility];  Service: Endoscopy;  Laterality: N/A;  . LAPAROSCOPIC GASTRIC BANDING  01-05-2008  . LAPAROSCOPIC GASTRIC BANDING  12/18/2010   01/04/2009  . SALPHINGOOOPHORECTOMY Right    cyst removal  . TONSILLECTOMY    . WEDGE RESECTION OF OVARY  LEFT OVARY     Social History:  reports that she has never smoked. She has never used smokeless tobacco. She reports that she does not drink alcohol or use drugs.  Allergies:  Allergies  Allergen Reactions  . Ancef [Cefazolin Sodium] Nausea And Vomiting  . Contrast Media [Iodinated Diagnostic Agents]     apnea  . Penicillins Other (See Comments)    Itchy rash as a kid, it happened when she was a child     (Not in a hospital admission)  Blood pressure 103/89, pulse 89, temperature 99.8 F (37.7 C), temperature source Oral, resp. rate 23, height '5\' 6"'  (1.676 m), weight 252 lb (114.3 kg), SpO2 100 %.  Physical Exam: General: pleasant, WD/WN obese, pale, white female who is laying in bed in NAD HEENT: head is normocephalic, atraumatic.  Sclera are noninjected.  Oral mucosa is pink and moist Heart: regular, rate, and rhythm.  No obvious murmurs, gallops, or rubs noted.  2+ radial and DP pulses bilaterally Lungs: CTAB, no wheezes, rhonchi, or rales noted.  Respiratory effort nonlabored Abd: soft, obese, NT/ND, +BS, incision without drainage or surrounding erythema, see photo below MS: all 4 extremities are symmetrical, full ROM, no edema, mild TTP to right posterior ribs,  no ecchymosis or deformity noted to back Skin: warm and dry with no rashes noted Psych: A&Ox3 with an appropriate affect. Neuro: CM grossly 2-12 intact, normal speech      Results for orders placed or performed during the hospital encounter of 01/25/16 (from the past 48 hour(s))  Comprehensive metabolic panel     Status: Abnormal   Collection Time: 01/25/16 11:27 AM  Result Value Ref Range   Sodium 135 135 - 145 mmol/L   Potassium 3.6 3.5 - 5.1 mmol/L   Chloride 104 101 - 111 mmol/L   CO2 22 22 - 32 mmol/L   Glucose, Bld 116 (H) 65 - 99 mg/dL   BUN 19 6 - 20 mg/dL   Creatinine, Ser 0.79 0.44 - 1.00 mg/dL   Calcium 8.4 (L) 8.9 - 10.3 mg/dL   Total Protein 5.7 (L) 6.5 - 8.1 g/dL   Albumin 2.9 (L) 3.5 - 5.0 g/dL   AST 17 15 - 41 U/L   ALT 15 14 - 54 U/L   Alkaline Phosphatase 59 38 - 126 U/L   Total Bilirubin 1.7 (H) 0.3 -  1.2 mg/dL   GFR calc non Af Amer >60 >60 mL/min   GFR calc Af Amer >60 >60 mL/min    Comment: (NOTE) The eGFR has been calculated using the CKD EPI equation. This calculation has not been validated in all clinical situations. eGFR's persistently <60 mL/min signify possible Chronic Kidney Disease.    Anion gap 9 5 - 15  CBC WITH DIFFERENTIAL     Status: Abnormal   Collection Time: 01/25/16 11:27 AM  Result Value Ref Range   WBC 11.0 (H) 4.0 - 10.5 K/uL   RBC 3.84 (L) 3.87 - 5.11 MIL/uL   Hemoglobin 10.1 (L) 12.0 - 15.0 g/dL   HCT 32.0 (L) 36.0 - 46.0 %   MCV 83.3 78.0 - 100.0 fL   MCH 26.3 26.0 - 34.0 pg   MCHC 31.6 30.0 - 36.0 g/dL   RDW 18.3 (H) 11.5 - 15.5 %   Platelets 223 150 - 400 K/uL   Neutrophils Relative % 83 %   Neutro Abs 9.1 (H) 1.7 - 7.7 K/uL   Lymphocytes Relative 11 %   Lymphs Abs 1.2 0.7 - 4.0 K/uL   Monocytes Relative 6 %   Monocytes Absolute 0.7 0.1 - 1.0 K/uL   Eosinophils Relative 0 %   Eosinophils Absolute 0.0 0.0 - 0.7 K/uL   Basophils Relative 0 %   Basophils Absolute 0.0 0.0 - 0.1 K/uL  Protime-INR     Status: Abnormal    Collection Time: 01/25/16 11:27 AM  Result Value Ref Range   Prothrombin Time 19.1 (H) 11.4 - 15.2 seconds   INR 1.58   I-Stat CG4 Lactic Acid, ED  (not at  [facility])     Status: Abnormal   Collection Time: 01/25/16 12:03 PM  Result Value Ref Range   Lactic Acid, Venous 2.09 (HH) 0.5 - 1.9 mmol/L   Comment NOTIFIED PHYSICIAN   Type and screen [place]     Status: None   Collection Time: 01/25/16 12:34 PM  Result Value Ref Range   ABO/RH(D) O POS    Antibody Screen NEG    Sample Expiration 01/28/2016   ABO/Rh     Status: None   Collection Time: 01/25/16 12:36 PM  Result Value Ref Range   ABO/RH(D) O POS   Urinalysis, Routine w reflex microscopic     Status: None   Collection Time: 01/25/16  2:47 PM  Result Value Ref Range   Color, Urine YELLOW YELLOW   APPearance CLEAR CLEAR   Specific Gravity, Urine 1.014 1.005 - 1.030   pH 5.0 5.0 - 8.0   Glucose, UA NEGATIVE NEGATIVE mg/dL   Hgb urine dipstick NEGATIVE NEGATIVE   Bilirubin Urine NEGATIVE NEGATIVE   Ketones, ur NEGATIVE NEGATIVE mg/dL   Protein, ur NEGATIVE NEGATIVE mg/dL   Nitrite NEGATIVE NEGATIVE   Leukocytes, UA NEGATIVE NEGATIVE  I-Stat CG4 Lactic Acid, ED  (not at  [facility])     Status: Abnormal   Collection Time: 01/25/16  3:16 PM  Result Value Ref Range   Lactic Acid, Venous 2.30 (HH) 0.5 - 1.9 mmol/L   Comment NOTIFIED PHYSICIAN    Ct Abdomen Pelvis Wo Contrast  Result Date: 01/25/2016 CLINICAL DATA:  Recent abdominal plasty with fever and diffuse lower abdominal pain EXAM: CT ABDOMEN AND PELVIS WITHOUT CONTRAST TECHNIQUE: Multidetector CT imaging of the abdomen and pelvis was performed following the standard protocol without IV contrast. COMPARISON:  01/05/2016 FINDINGS: Lower chest: The lung bases demonstrate minimal atelectasis. No acute consolidation  or pleural effusion is visualized. Heart size is nonenlarged. Hepatobiliary: No focal hepatic abnormality. Surgical clips in the gallbladder fossa.  No intra hepatic biliary dilatation. Mildly prominent extrahepatic common bile duct at the porta hepatis is fell postsurgical. Pancreas: Unremarkable. No pancreatic ductal dilatation or surrounding inflammatory changes. Spleen: Normal in size without focal abnormality. Adrenals/Urinary Tract: Adrenal glands are within normal limits. Dilated right extrarenal pelvis. Mild left hydronephrosis. There is a 3-4 mm stone within the lower left kidney. There is no evidence for ureteral stone. The bladder is enlarged. Stomach/Bowel: Stomach demonstrates evidence of prior laparoscopic banding. There is a small to moderate hiatal hernia. No evidence for dilated small bowel. Moderate stool in the colon. No colon wall thickening. Diverticular disease of the sigmoid colon without acute inflammation. Vascular/Lymphatic: Atherosclerosis of the aorta. No significantly enlarged lymph nodes. Reproductive: Uterus and bilateral adnexa are unremarkable. Other: Extensive fluid and soft tissue stranding within the infraumbilical abdominal wall. Multifocal areas of increased attenuation suggestive of hemorrhage. Multiple pockets of gas present within the subcutaneous with more focal gas collection within the subcutaneous fat of the right lower quadrant. Small pockets of subcutaneous gas present deep to the umbilicus, soft tissue thickening is present at the umbilicus. No free air Musculoskeletal: There are degenerative changes of the spine. No acute osseous abnormality. IMPRESSION: 1. Extensive fluid and soft tissue stranding/edema within the subcutaneous fat of the infraumbilical anterior and lateral abdominal wall. There are multifocal pockets of gas within the subcutaneous fat. Areas of increased density are present within the fluid and fat stranding suggestive of hematoma. Findings could be secondary to recent abdominal plasty, however the presence of infection cannot be ruled out. 2. Punctate stone in the left kidney as before. Dilated  right extrarenal pelvis with mild left hydronephrosis, no evidence for ureteral stone. Findings could be secondary to marked bladder distention 3. No acute inflammatory process is visualized within the abdominal or pelvic cavity 4. Sigmoid colon diverticular disease without acute inflammation 5. Stable gastric banding.  No evidence for obstruction. Electronically Signed   By: Donavan Foil M.D.   On: 01/25/2016 14:50   Dg Chest 2 View  Result Date: 01/25/2016 CLINICAL DATA:  Fever.  Recent surgery. EXAM: CHEST  2 VIEW COMPARISON:  05/04/2015 FINDINGS: The heart size and mediastinal contours are within normal limits. Both lungs are clear. The visualized skeletal structures are unremarkable. IMPRESSION: No active cardiopulmonary disease. Electronically Signed   By: Franchot Gallo M.D.   On: 01/25/2016 13:25      Assessment/Plan  Possible abdominal hematoma with questionable infection:  S/p abdominoplasty 12/11 - Have pt admitted to medicine - pt with fever and mild leukocytosis but no tenderness on exam, could be the beginning of an infected hematoma.  - IV antibiotics per pharmacy - IV fluids and recheck lactic acid - Will consult IR for possible drain placement of hematomas - hold Xarelto  Does not look like pt will need surgery at this point. Will consult IR for possible drainage vs drain placement. Hg 10.1 today. Will recheck H&H tomorrow AM. We will continue to monitor the pt.    Kalman Drape, [facility] Surgery 01/25/2016, 3:56 PM Pager: [phone] Consults: [phone] Mon-Fri 7:00 am-4:30 pm Sat-Sun 7:00 am-11:30 am

## 2016-01-25 NOTE — H&P (Signed)
History and Physical    [name] [id number] DOB: Jun 28, 1960 DOA: 01/25/2016   PCP: [facility]   Patient coming from:  Home   Chief Complaint: Abdominal hematoma with bleeding   HPI: [name] is a 55 y.o. female with medical history significant for Afib, h/p PE on Xarelto, presenting via EMS with c/p post op complications after abdominoplasty done in [place] in 12/11. She had been discharged on 12/12 with Cipro but her status as complicated by abdominal hematoma. SHe reported returning to the hospital while there for hematoma drainage.She continued taking Cipro on her own and flew back today to the [place]. She reports odorous, dark blood drainage in the RLQ. She was dizzy and dehydrated, feeling weak  . Denies any fever, chills, night sweats, nausea or vomiting. No chest pain or shortness of breath . Denies dysuria, hematuria or trouble with bowel movements. Of note, she did report right flank discomfort during her flight . She denies any lower extremity swelling.     ED Course:  BP 124/57   Pulse 85   Temp 99.8 F (37.7 C) (Oral)   Resp (!) 27   Ht 5\' 6"  (1.676 m)   Wt 114.3 kg (252 lb)   SpO2 100%   BMI 40.67 kg/m    white count 11 hemoglobin 10.1 platelets 223 calcium 8.4 INR 1.58  glucose 116  lactic acid went  from 2.09 to  2.30.  Vitals signs are stable at this time, but T max is 99.8. (was 11.8 on arrival )  CT of the abdomen shows Extensive fluid and soft tissue stranding/edema within the subcutaneous fat of the infraumbilical anterior and lateral abdominal wall. There are multifocal pockets of gas within the subcutaneous fat. Areas of increased density are present within thefluid and fat stranding suggestive of hematoma. Findings could besecondary to recent abdominal plasty, however the presence ofinfection cannot be ruled out. CXR NAD    Review of Systems: As per HPI otherwise 10 point review of systems negative.   Past Medical History:    Diagnosis Date  . Anemia   . Arthritis   . Atrial fibrillation ([place])   . Bleeding ulcer 12-2010  hospitalized for 1 week  . Blood transfusion   . Blood transfusion without reported diagnosis    2011-2 units transfused, bleeding ulcer  . Bruises easily   . Constipation   . Diabetes mellitus    Pt had DM prior to Lap Band. Does not have it now per pt.  . Difficulty in swallowing 03-24-13   no longer a problem  . DVT (deep venous thrombosis) ([place]) 06-2013   right leg  . GERD (gastroesophageal reflux disease)   . Hyperlipidemia   . Leg swelling 03-24-13   not a problem any longer  . Peripheral vascular disease ([place])   . Pulmonary embolus ([place]) 06-2013  . Reflux   . Shortness of breath   . Vomiting 03-24-13   problem resolved after lap band revision    Past Surgical History:  Procedure Laterality Date  . APPENDECTOMY    . CARPEL TUNNEL BILATEREAL    . CHOLECYSTECTOMY     5'12  . COLONOSCOPY N/A 04/10/2013   Procedure: COLONOSCOPY;  Surgeon: Beryle Beams, MD;  Location: [facility];  Service: Endoscopy;  Laterality: N/A;  . DIAGNOSTIC LAPAROSCOPY    . ENDOVENOUS ABLATION SAPHENOUS VEIN W/ LASER  06-2008  . ENDOVENOUS ABLATION SAPHENOUS VEIN W/ LASER Left 12-09-2013   EVLA LEFT GREATER  SAPHENOUS VEIN BY TODD EARLY MD  . ESOPHAGOGASTRODUODENOSCOPY  12/18/2010   Procedure: ESOPHAGOGASTRODUODENOSCOPY (EGD);  Surgeon: Gatha Mayer, MD;  Location: [facility];  Service: Endoscopy;  Laterality: N/A;  . LAPAROSCOPIC GASTRIC BANDING  01-05-2008  . LAPAROSCOPIC GASTRIC BANDING  12/18/2010   01/04/2009  . SALPHINGOOOPHORECTOMY Right    cyst removal  . TONSILLECTOMY    . WEDGE RESECTION OF OVARY  LEFT OVARY     Social History Social History   Social History  . Marital status: Married    Spouse name: N/A  . Number of children: N/A  . Years of education: N/A   Occupational History  . RN [employer]   Social History Main Topics  . Smoking status: Never Smoker  .  Smokeless tobacco: Never Used  . Alcohol use No  . Drug use: No  . Sexual activity: Yes    Partners: Male    Birth control/ protection: None     Comment: declined condoms   Other Topics Concern  . Not on file   Social History Narrative  . No narrative on file     Allergies  Allergen Reactions  . Ancef [Cefazolin Sodium] Nausea And Vomiting  . Contrast Media [Iodinated Diagnostic Agents]     apnea  . Penicillins Other (See Comments)    Itchy rash as a kid, it happened when she was a child Has patient had a PCN reaction causing immediate rash, facial/tongue/throat swelling, SOB or lightheadedness with hypotension: YES Has patient had a PCN reaction causing severe rash involving mucus membranes or skin necrosis: NO Has patient had a PCN reaction that required hospitalization NO Has patient had a PCN reaction occurring within the last 10 years: NO If all of the above answers are "NO", then may proceed with Cephalosporin use.    Family History  Problem Relation Age of Onset  . Heart disease Father   . Kidney disease Father   . Peripheral vascular disease Father   . Heart attack Father     94  . Diabetes Mother   . Arthritis Mother       Prior to Admission medications   Medication Sig Start Date End Date Taking? Authorizing Provider  acetaminophen (TYLENOL) 500 MG tablet Take 500 mg by mouth as needed for mild pain.     Historical Provider, MD  Biotin 10000 MCG TABS Take by mouth daily.    Historical Provider, MD  celecoxib (CELEBREX) 200 MG capsule Take 200 mg by mouth as needed for mild pain.     Historical Provider, MD  Cholecalciferol (VITAMIN D HIGH POTENCY) 1000 units capsule Take 1,000 Units by mouth daily.    Historical Provider, MD  docusate sodium (COLACE) 100 MG capsule Take 200 mg by mouth daily.     Historical Provider, MD  escitalopram (LEXAPRO) 10 MG tablet Take 10 mg by mouth daily.    Historical Provider, MD  Linaclotide Rolan Lipa) 145 MCG CAPS capsule  Take 145 mcg by mouth daily as needed (for IBS pain).    Historical Provider, MD  metoprolol tartrate (LOPRESSOR) 25 MG tablet Take 0.5 tablets (12.5 mg total) by mouth 2 (two) times daily. 06/13/15   Thayer Headings, MD  Multiple Vitamins-Minerals (MULTIVITAMINS THER. W/MINERALS) TABS Take 1 tablet by mouth daily.      Historical Provider, MD  omeprazole (PRILOSEC) 20 MG capsule Take 20 mg by mouth 2 (two) times daily before a meal.    Historical Provider, MD  Pseudoephedrine-APAP-DM (DAYQUIL PO) Take  15 mLs by mouth daily as needed (cold symptoms).    Historical Provider, MD  XARELTO 20 MG TABS tablet TAKE 1 TABLET BY MOUTH ONCE DAILY WITH SUPPER 07/28/15   Chesley Mires, MD    Physical Exam:    Vitals:   01/25/16 1545 01/25/16 1600 01/25/16 1615 01/25/16 1630  BP: (!) 118/50 (!) 118/50 118/91 124/57  Pulse: 86 85 87 85  Resp: 17 24 19  (!) 27  Temp:      TempSrc:      SpO2: 99% 100% 100% 100%  Weight:      Height:           Constitutional: NAD, calm, comfortable  Vitals:   01/25/16 1545 01/25/16 1600 01/25/16 1615 01/25/16 1630  BP: (!) 118/50 (!) 118/50 118/91 124/57  Pulse: 86 85 87 85  Resp: 17 24 19  (!) 27  Temp:      TempSrc:      SpO2: 99% 100% 100% 100%  Weight:      Height:       Eyes: PERRL, lids and conjunctivae normal ENMT: Mucous membranes are moist. Posterior pharynx clear of any exudate or lesions.Normal dentition.  Neck: normal, supple, no masses, no thyromegaly Respiratory: clear to auscultation bilaterally, no wheezing, no crackles. Normal respiratory effort. No accessory muscle use.  Cardiovascular: Regular rate and rhythm, no murmurs / rubs / gallops. No extremity edema. 2+ pedal pulses. No carotid bruits.  Abdomen ,  Incisional pain with drainage at the Lft abdomen area, without drainage but with significant large ecchymoses no masses palpated. No hepatosplenomegaly. Bowel sounds positive.  No CVAT  Musculoskeletal: no clubbing / cyanosis. No joint  deformity upper and lower extremities. Good ROM, no contractures. Normal muscle tone.  Skin: no rashes,   ulcers. But ecchymosis in abdomen as above .  Neurologic: CN 2-12 grossly intact. Sensation intact, DTR normal. Strength 5/5 in all 4.       Labs on Admission: I have personally reviewed following labs and imaging studies  CBC:  Recent Labs Lab 01/25/16 1127  WBC 11.0*  NEUTROABS 9.1*  HGB 10.1*  HCT 32.0*  MCV 83.3  PLT [id number]    Basic Metabolic Panel:  Recent Labs Lab 01/25/16 1127  NA 135  K 3.6  CL 104  CO2 22  GLUCOSE 116*  BUN 19  CREATININE 0.79  CALCIUM 8.4*    GFR: Estimated Creatinine Clearance: 100.8 mL/min (by C-G formula based on SCr of 0.79 mg/dL).  Liver Function Tests:  Recent Labs Lab 01/25/16 1127  AST 17  ALT 15  ALKPHOS 59  BILITOT 1.7*  PROT 5.7*  ALBUMIN 2.9*   No results for input(s): LIPASE, AMYLASE in the last 168 hours. No results for input(s): AMMONIA in the last 168 hours.  Coagulation Profile:  Recent Labs Lab 01/25/16 1127  INR 1.58    Cardiac Enzymes: No results for input(s): CKTOTAL, CKMB, CKMBINDEX, TROPONINI in the last 168 hours.  BNP (last 3 results) No results for input(s): PROBNP in the last 8760 hours.  HbA1C: No results for input(s): HGBA1C in the last 72 hours.  CBG: No results for input(s): GLUCAP in the last 168 hours.  Lipid Profile: No results for input(s): CHOL, HDL, LDLCALC, TRIG, CHOLHDL, LDLDIRECT in the last 72 hours.  Thyroid Function Tests: No results for input(s): TSH, T4TOTAL, FREET4, T3FREE, THYROIDAB in the last 72 hours.  Anemia Panel: No results for input(s): VITAMINB12, FOLATE, FERRITIN, TIBC, IRON, RETICCTPCT in the last 72 hours.  Urine analysis:    Component Value Date/Time   COLORURINE YELLOW 01/25/2016 [place] 01/25/2016 1447   LABSPEC 1.014 01/25/2016 1447   PHURINE 5.0 01/25/2016 1447   GLUCOSEU NEGATIVE 01/25/2016 1447   GLUCOSEU NEGATIVE  09/04/2013 1605   HGBUR NEGATIVE 01/25/2016 1447   BILIRUBINUR NEGATIVE 01/25/2016 1447   KETONESUR NEGATIVE 01/25/2016 1447   PROTEINUR NEGATIVE 01/25/2016 1447   UROBILINOGEN 1.0 09/04/2013 1605   NITRITE NEGATIVE 01/25/2016 1447   LEUKOCYTESUR NEGATIVE 01/25/2016 1447    Sepsis Labs: @LABRCNTIP (procalcitonin:4,lacticidven:4) )No results found for this or any previous visit (from the past 240 hour(s)).   Radiological Exams on Admission: Ct Abdomen Pelvis Wo Contrast  Result Date: 01/25/2016 CLINICAL DATA:  Recent abdominal plasty with fever and diffuse lower abdominal pain EXAM: CT ABDOMEN AND PELVIS WITHOUT CONTRAST TECHNIQUE: Multidetector CT imaging of the abdomen and pelvis was performed following the standard protocol without IV contrast. COMPARISON:  01/05/2016 FINDINGS: Lower chest: The lung bases demonstrate minimal atelectasis. No acute consolidation or pleural effusion is visualized. Heart size is nonenlarged. Hepatobiliary: No focal hepatic abnormality. Surgical clips in the gallbladder fossa. No intra hepatic biliary dilatation. Mildly prominent extrahepatic common bile duct at the porta hepatis is fell postsurgical. Pancreas: Unremarkable. No pancreatic ductal dilatation or surrounding inflammatory changes. Spleen: Normal in size without focal abnormality. Adrenals/Urinary Tract: Adrenal glands are within normal limits. Dilated right extrarenal pelvis. Mild left hydronephrosis. There is a 3-4 mm stone within the lower left kidney. There is no evidence for ureteral stone. The bladder is enlarged. Stomach/Bowel: Stomach demonstrates evidence of prior laparoscopic banding. There is a small to moderate hiatal hernia. No evidence for dilated small bowel. Moderate stool in the colon. No colon wall thickening. Diverticular disease of the sigmoid colon without acute inflammation. Vascular/Lymphatic: Atherosclerosis of the aorta. No significantly enlarged lymph nodes. Reproductive: Uterus and  bilateral adnexa are unremarkable. Other: Extensive fluid and soft tissue stranding within the infraumbilical abdominal wall. Multifocal areas of increased attenuation suggestive of hemorrhage. Multiple pockets of gas present within the subcutaneous with more focal gas collection within the subcutaneous fat of the right lower quadrant. Small pockets of subcutaneous gas present deep to the umbilicus, soft tissue thickening is present at the umbilicus. No free air Musculoskeletal: There are degenerative changes of the spine. No acute osseous abnormality. IMPRESSION: 1. Extensive fluid and soft tissue stranding/edema within the subcutaneous fat of the infraumbilical anterior and lateral abdominal wall. There are multifocal pockets of gas within the subcutaneous fat. Areas of increased density are present within the fluid and fat stranding suggestive of hematoma. Findings could be secondary to recent abdominal plasty, however the presence of infection cannot be ruled out. 2. Punctate stone in the left kidney as before. Dilated right extrarenal pelvis with mild left hydronephrosis, no evidence for ureteral stone. Findings could be secondary to marked bladder distention 3. No acute inflammatory process is visualized within the abdominal or pelvic cavity 4. Sigmoid colon diverticular disease without acute inflammation 5. Stable gastric banding.  No evidence for obstruction. Electronically Signed   By: Donavan Foil M.D.   On: 01/25/2016 14:50   Dg Chest 2 View  Result Date: 01/25/2016 CLINICAL DATA:  Fever.  Recent surgery. EXAM: CHEST  2 VIEW COMPARISON:  05/04/2015 FINDINGS: The heart size and mediastinal contours are within normal limits. Both lungs are clear. The visualized skeletal structures are unremarkable. IMPRESSION: No active cardiopulmonary disease. Electronically Signed   By: Franchot Gallo M.D.   On: 01/25/2016  13:25    EKG: Independently reviewed.  Assessment/Plan Active Problems:   Wound  infection   Hydronephrosis, left   Hydronephrosis, right   Diabetes mellitus ([place])   Pulmonary embolism ([facility])   ASD (atrial septal defect)   DVT (deep venous thrombosis) ([facility])   Atrial fibrillation ([place])    Abdominal Hematoma with drainage, suspect infectious s/p abdominoplasty on 12/11 in [place] . WBC 11, tachy, T max 99. Abd CT shows  Extensive fluid and soft tissue stranding/edema within the subcutaneous fat of the infraumbilical anterior and lateral abdominal wall with multifocal pockets of gas within the subcutaneous fat.  CT drainage image pending Received Vanc/Zosyn and IVF .  Admit to stepdown tele.  Serial lactic acid  Surgery to see IR for Drainage.  Continue Vanc Zosyn  IVF at NS 125 cc/h  Cultures    History of Pulmonary Embolism on Xarelto  O2 as needed . CXR negative. Recent very long distance trip, arrived today  Hold Xarelto due to major abdominal hematoma and possible surgery., place SCDs  EKG and Tn    Atrial Fibrillation CHA2DS2-VASc score 3 , on anticoagulation with Xarelto . No EKG or Tn available for review. Denies CP or palps  Anticoagulation on hold for abdominal hematoma and possible surgery as above  EKG and Serial Tn   Type II Diabetes Current blood sugar level is 116 No results found for: HGBA1C Hgb A1C Hold oral meds   SSI Heart healthy carb modified diet.    Anemia of blood loss Current Hb 10.1 Continue to monitor T+C in case transfusion is needed CBC in am   Right Hydronephrosis  CT of the abdomen shows Dilated right extrarenal pelvis. Mild left hydronephrosis.  4 mm stone within the lower left kidney.  Denies dysuria or hematuria. Denies  Flank pain . WBC 11 , Tmax 99.8 C    Will continue to hydrate.   DVT prophylaxis:   SCD for now for possible surgery and due to severe abdominal hematoma  Code Status:   Full     Family Communication:  Discussed with patient Disposition Plan: Expect patient to be discharged to home after condition  improves Consults called:    None Admission status:  SDU    [facility] E, PA-C [facility]   01/25/2016, 4:57 PM

## 2016-01-26 LAB — COMPREHENSIVE METABOLIC PANEL WITH GFR
ALT: 11 U/L — ABNORMAL LOW (ref 14–54)
AST: 12 U/L — ABNORMAL LOW (ref 15–41)
Albumin: 2.3 g/dL — ABNORMAL LOW (ref 3.5–5.0)
Alkaline Phosphatase: 49 U/L (ref 38–126)
Anion gap: 5 (ref 5–15)
BUN: 9 mg/dL (ref 6–20)
CO2: 24 mmol/L (ref 22–32)
Calcium: 7.7 mg/dL — ABNORMAL LOW (ref 8.9–10.3)
Chloride: 110 mmol/L (ref 101–111)
Creatinine, Ser: 0.67 mg/dL (ref 0.44–1.00)
GFR calc Af Amer: >60 mL/min
GFR calc non Af Amer: >60 mL/min
Glucose, Bld: 88 mg/dL (ref 65–99)
Potassium: 3.7 mmol/L (ref 3.5–5.1)
Sodium: 139 mmol/L (ref 135–145)
Total Bilirubin: 1.4 mg/dL — ABNORMAL HIGH (ref 0.3–1.2)
Total Protein: 5.1 g/dL — ABNORMAL LOW (ref 6.5–8.1)

## 2016-01-26 LAB — CBC
HCT: 26.4 % — ABNORMAL LOW (ref 36.0–46.0)
HEMOGLOBIN: 8.3 g/dL — AB (ref 12.0–15.0)
MCH: 26.6 pg (ref 26.0–34.0)
MCHC: 31.4 g/dL (ref 30.0–36.0)
MCV: 84.6 fL (ref 78.0–100.0)
Platelets: 179 10*3/uL (ref 150–400)
RBC: 3.12 MIL/uL — AB (ref 3.87–5.11)
RDW: 18.2 % — ABNORMAL HIGH (ref 11.5–15.5)
WBC: 5.6 10*3/uL (ref 4.0–10.5)

## 2016-01-26 LAB — GLUCOSE, CAPILLARY
GLUCOSE-CAPILLARY: 80 mg/dL (ref 65–99)
GLUCOSE-CAPILLARY: 125 mg/dL — AB (ref 65–99)
Glucose-Capillary: 80 mg/dL (ref 65–99)
Glucose-Capillary: 103 mg/dL — ABNORMAL HIGH (ref 65–99)
Glucose-Capillary: 99 mg/dL (ref 65–99)

## 2016-01-26 LAB — URINE CULTURE

## 2016-01-26 LAB — HEMOGLOBIN A1C
Hgb A1c MFr Bld: <4.2 % — ABNORMAL LOW (ref 4.8–5.6)
Mean Plasma Glucose: <74 mg/dL

## 2016-01-26 LAB — PREPARE RBC (CROSSMATCH)

## 2016-01-26 LAB — PROTIME-INR
INR: 1.36
PROTHROMBIN TIME: 16.9 s — AB (ref 11.4–15.2)

## 2016-01-26 LAB — HEMOGLOBIN: Hemoglobin: 9.7 g/dL — ABNORMAL LOW (ref 12.0–15.0)

## 2016-01-26 MED ORDER — SODIUM CHLORIDE 0.9 % IV SOLN: Freq: Once | INTRAVENOUS | Status: DC

## 2016-01-26 MED ORDER — SODIUM CHLORIDE 0.9% FLUSH
10.0000 mL | INTRAVENOUS | Status: DC | PRN
  Administered 2016-01-27: 10 mL
  Filled 2016-01-26: qty 40

## 2016-01-26 NOTE — Progress Notes (Signed)
Patient running fever 99.8, MD aware. Received order to give tylenol prior to blood admin. Tylenol given and blood started.

## 2016-01-26 NOTE — H&P (Signed)
Referring Physician(s): Connor,C  Supervising Physician: Arne Cleveland  Patient Status:  [facility] - In-pt  Chief Complaint:  Abdominal wall hematomas  Subjective: Pt familiar to IR service from prior bilateral PE lysis in 2015 (on xarelto). She also has past medical history significant for Afib and presented to ED 12/20 with fever, weakness , abd pain secondary to complications after abdominoplasty done in [place] in 12/11. She had been discharged on 12/12 with Cipro but her status was complicated by abdominal hematoma. She reported returning to the hospital while there for hematoma drainage.She continued taking Cipro on her own and flew back  to the [place]. She has has persistent drainage from wound , more so on left abd region.She also c/o intermittent HA's and constipation. Subsequent CT abd/pelvis revealed the following:   1.Extensive fluid and soft tissue stranding/edema within the subcutaneous fat of the infraumbilical anterior and lateralabdominal wall. There are multifocal pockets of gas within thesubcutaneous fat. Areas of increased density are present within the fluid and fat stranding suggestive of hematoma. Findings could besecondary to recent abdominal plasty, however the presence of infection cannot be ruled out. 2. Punctate stone in the left kidney as before. Dilated right extrarenal pelvis with mild left hydronephrosis, no evidence for ureteral stone. Findings could be secondary to marked bladder distention 3. No acute inflammatory process is visualized within the abdominal or pelvic cavity 4. Sigmoid colon diverticular disease without acute inflammation 5. Stable gastric banding.  No evidence for obstruction  Request now received from [facility] for CT guided aspiration vs drainage of the abdominal wall hematomas.   Allergies: Ancef [cefazolin sodium]; Contrast media [iodinated diagnostic agents]; and Penicillins  Medications: Prior to Admission medications   Medication Sig  Start Date End Date Taking? Authorizing Provider  acetaminophen (TYLENOL) 500 MG tablet Take 500 mg by mouth as needed for mild pain.    Yes Historical Provider, MD  Ascorbic Acid (VITAMIN C PO) Take 1 tablet by mouth daily.   Yes Historical Provider, MD  Biotin 10000 MCG TABS Take by mouth daily.   Yes Historical Provider, MD  celecoxib (CELEBREX) 200 MG capsule Take 200 mg by mouth as needed for mild pain.    Yes Historical Provider, MD  Cholecalciferol (VITAMIN D HIGH POTENCY) 1000 units capsule Take 1,000 Units by mouth daily.   Yes Historical Provider, MD  docusate sodium (COLACE) 100 MG capsule Take 200 mg by mouth daily.    Yes Historical Provider, MD  escitalopram (LEXAPRO) 10 MG tablet Take 10 mg by mouth daily.   Yes Historical Provider, MD  Linaclotide (LINZESS) 145 MCG CAPS capsule Take 145 mcg by mouth daily as needed (for IBS pain).   Yes Historical Provider, MD  metoprolol tartrate (LOPRESSOR) 25 MG tablet Take 0.5 tablets (12.5 mg total) by mouth 2 (two) times daily. 06/13/15  Yes Thayer Headings, MD  Multiple Vitamins-Minerals (MULTIVITAMINS THER. W/MINERALS) TABS Take 1 tablet by mouth daily.     Yes Historical Provider, MD  omeprazole (PRILOSEC) 20 MG capsule Take 20 mg by mouth 2 (two) times daily before a meal.   Yes Historical Provider, MD  Pseudoephedrine-APAP-DM (DAYQUIL PO) Take 15 mLs by mouth daily as needed (cold symptoms).   Yes Historical Provider, MD  rivaroxaban (XARELTO) 20 MG TABS tablet Take 20 mg by mouth daily. Patient states she takes every morning   Yes Historical Provider, MD  XARELTO 20 MG TABS tablet TAKE 1 TABLET BY MOUTH ONCE DAILY WITH SUPPER Patient not taking: Reported  on 01/25/2016 07/28/15   Chesley Mires, MD     Vital Signs: BP (!) 125/58 (BP Location: Left Arm)   Pulse 92   Temp 99.8 F (37.7 C) (Oral)   Resp (!) 21   Ht 5\' 6"  (1.676 m)   Wt 252 lb (114.3 kg)   SpO2 100%   BMI 40.67 kg/m   Physical Exam awake/alert; chest- CTA bilat; heart-  RRR; abd- obese, soft, extensive ecchymoses over mid to lower abd region with some bloody drainage from LLQ region; LE- no edema  Imaging: Ct Abdomen Pelvis Wo Contrast  Result Date: 01/25/2016 CLINICAL DATA:  Recent abdominal plasty with fever and diffuse lower abdominal pain EXAM: CT ABDOMEN AND PELVIS WITHOUT CONTRAST TECHNIQUE: Multidetector CT imaging of the abdomen and pelvis was performed following the standard protocol without IV contrast. COMPARISON:  01/05/2016 FINDINGS: Lower chest: The lung bases demonstrate minimal atelectasis. No acute consolidation or pleural effusion is visualized. Heart size is nonenlarged. Hepatobiliary: No focal hepatic abnormality. Surgical clips in the gallbladder fossa. No intra hepatic biliary dilatation. Mildly prominent extrahepatic common bile duct at the porta hepatis is fell postsurgical. Pancreas: Unremarkable. No pancreatic ductal dilatation or surrounding inflammatory changes. Spleen: Normal in size without focal abnormality. Adrenals/Urinary Tract: Adrenal glands are within normal limits. Dilated right extrarenal pelvis. Mild left hydronephrosis. There is a 3-4 mm stone within the lower left kidney. There is no evidence for ureteral stone. The bladder is enlarged. Stomach/Bowel: Stomach demonstrates evidence of prior laparoscopic banding. There is a small to moderate hiatal hernia. No evidence for dilated small bowel. Moderate stool in the colon. No colon wall thickening. Diverticular disease of the sigmoid colon without acute inflammation. Vascular/Lymphatic: Atherosclerosis of the aorta. No significantly enlarged lymph nodes. Reproductive: Uterus and bilateral adnexa are unremarkable. Other: Extensive fluid and soft tissue stranding within the infraumbilical abdominal wall. Multifocal areas of increased attenuation suggestive of hemorrhage. Multiple pockets of gas present within the subcutaneous with more focal gas collection within the subcutaneous fat of the  right lower quadrant. Small pockets of subcutaneous gas present deep to the umbilicus, soft tissue thickening is present at the umbilicus. No free air Musculoskeletal: There are degenerative changes of the spine. No acute osseous abnormality. IMPRESSION: 1. Extensive fluid and soft tissue stranding/edema within the subcutaneous fat of the infraumbilical anterior and lateral abdominal wall. There are multifocal pockets of gas within the subcutaneous fat. Areas of increased density are present within the fluid and fat stranding suggestive of hematoma. Findings could be secondary to recent abdominal plasty, however the presence of infection cannot be ruled out. 2. Punctate stone in the left kidney as before. Dilated right extrarenal pelvis with mild left hydronephrosis, no evidence for ureteral stone. Findings could be secondary to marked bladder distention 3. No acute inflammatory process is visualized within the abdominal or pelvic cavity 4. Sigmoid colon diverticular disease without acute inflammation 5. Stable gastric banding.  No evidence for obstruction. Electronically Signed   By: Donavan Foil M.D.   On: 01/25/2016 14:50   Dg Chest 2 View  Result Date: 01/25/2016 CLINICAL DATA:  Fever.  Recent surgery. EXAM: CHEST  2 VIEW COMPARISON:  05/04/2015 FINDINGS: The heart size and mediastinal contours are within normal limits. Both lungs are clear. The visualized skeletal structures are unremarkable. IMPRESSION: No active cardiopulmonary disease. Electronically Signed   By: Franchot Gallo M.D.   On: 01/25/2016 13:25    Labs:  CBC:  Recent Labs  05/04/15 1830 01/25/16 1127 01/26/16 [id number]  WBC 5.5 11.0* 5.6  HGB 14.0 10.1* 8.3*  HCT 44.6 32.0* 26.4*  PLT 153 223 179    COAGS:  Recent Labs  01/25/16 1127 01/26/16 0614  INR 1.58 1.36    BMP:  Recent Labs  05/04/15 1830 01/25/16 1127 01/26/16 0614  NA 140 135 139  K 3.8 3.6 3.7  CL 106 104 110  CO2 24 22 24   GLUCOSE 98 116* 88    BUN 16 19 9   CALCIUM 9.2 8.4* 7.7*  CREATININE 0.75 0.79 0.67  GFRNONAA >60 >60 >60  GFRAA >60 >60 >60    LIVER FUNCTION TESTS:  Recent Labs  01/25/16 1127 01/26/16 0614  BILITOT 1.7* 1.4*  AST 17 12*  ALT 15 11*  ALKPHOS 59 49  PROT 5.7* 5.1*  ALBUMIN 2.9* 2.3*    Assessment and Plan: Pt with hx bilat PE/RLE DVT in 2015 (prev on xarelto and s/p catheter directed lysis 2015), DM, afib, s/p abdominoplasty in [place] 01/16/16 and now presenting with abd pain, fever, weakness, drainage from LLQ incision and abd wall hematoma?infected on latest imaging. Request received from [facility] for CT guided aspiration vs drainage of hematomas.Imaging was reviewed by Dr. Vernard Gambles. Plan is for aspiration of collection today as drain is currently not indicated (no discrete abscess present currently to warrant drain placement). Details/risks of procedure, incl but not limited to, internal bleeding, infection, injury to adjacent structures d/w pt/husband with their understanding and consent. CURRENT LABS-WBC 5.6, HGB 8.3, PLTS 179K, PT 16.9/INR 1.36, CREAT 0.67.    Electronically Signed: D. Rowe Robert 01/26/2016, 9:37 AM   I spent a total of 25 minutes at the the patient's bedside AND on the patient's hospital floor or unit, greater than 50% of which was counseling/coordinating care for CT guided aspiration of abdominal wall hematomas

## 2016-01-26 NOTE — Progress Notes (Signed)
PROGRESS NOTE    [name]  [id number] DOB: [birth year] DOA: 01/25/2016 PCP: [facility]   Brief Narrative:  55 y.o. WF PMHx  A-fib, PE/DVT on Xarelto, DM2 controlled with complication, PVD, HLD,Bleeding ulcer, Anemia  Presenting via EMS with c/p post op complications after abdominoplasty done in [place] in 12/11. She had been discharged on 12/12 with Cipro but her status as complicated by abdominal hematoma. SHe reported returning to the hospital while there for hematoma drainage.She continued taking Cipro on her own and flew back today to the [place]. She reports odorous, dark blood drainage in the RLQ. She was dizzy and dehydrated, feeling weak  . Denies any fever, chills, night sweats, nausea or vomiting. No chest pain or shortness of breath . Denies dysuria, hematuria or trouble with bowel movements. Of note, she did report right flank discomfort during her flight . She denies any lower extremity swelling.     ED Course:  BP 124/57   Pulse 85   Temp 99.8 F (37.7 C) (Oral)   Resp (!) 27   Ht 5\' 6"  (1.676 m)   Wt 114.3 kg (252 lb)   SpO2 100%   BMI 40.67 kg/m    white count 11 hemoglobin 10.1 platelets 223 calcium 8.4 INR 1.58  glucose 116  lactic acid went  from 2.09 to  2.30.  Vitals signs are stable at this time, but T max is 99.8. (was 11.8 on arrival )  CT of the abdomen shows Extensive fluid and soft tissue stranding/edema within the subcutaneous fat of the infraumbilical anterior and lateral abdominal wall. There are multifocal pockets of gas within the subcutaneous fat. Areas of increased density are present within thefluid and fat stranding suggestive of hematoma. Findings could besecondary to recent abdominal plasty, however the presence ofinfection cannot be ruled out. CXR NAD    Subjective: 12/21  A/O 4, positive abdominal pain greatest LLQ. Positive headache. Negative CP, negative SOB. Positive dizziness.   Assessment & Plan:   Active  Problems:   Diabetes mellitus ([place])   Pulmonary embolism (HCC)   ASD (atrial septal defect)   Wound infection   Hydronephrosis, left   Hydronephrosis, right   DVT (deep venous thrombosis) (HCC)   Atrial fibrillation ([place])   Diabetes mellitus with complication (HCC)   Wound infection after surgery, initial encounter   Abdominal Hematoma with drainage, suspect infectious s/p abdominoplasty on 12/11 in [place] .  -MAXIMUM TEMPERATURE last 24 hours 38.7 C - Abd CT shows  Extensive fluid and soft tissue stranding/edema within the subcutaneous fat of the infraumbilical anterior and lateral abdominal wall with multifocal pockets of gas within the subcutaneous fat.  CT drainage image pending Received Vanc/Zosyn and IVF .  -IR for Drainage.  -Continue Vanc Zosyn  -continue normal saline 125 cc/h  -Per surgery S/p abdominoplasty in [place] 12/11 with likely infected hematoma. Continue fluid resus/ IV abx, IR consult for aspiration/possible drain placement and culture of bilateral subcu fluid pockets   History of Pulmonary Embolism  -on Xarelto   since 2015 currently on hold   Atrial Fibrillation CHA2DS2-VASc score 3 ,  -Xarelto on hold .  -No EKG or Tn available for review.  -Currently in NSR   Type II Diabetes  Current blood sugar level is 116  Acute Blood loss Anemia  -Symptomatic Recent Labs Lab 01/25/16 1127 01/26/16 0614  HGB 10.1* 8.3*  -Transfuse for hemoglobin <8  -Hg q 8hr -12/21 transfuse one unit PRBC  Right Hydronephrosis  CT of the abdomen shows  -Dilated right extrarenal pelvis. Mild left hydronephrosis.  4 mm stone within the lower left kidney.  Denies dysuria or hematuria. Denies  Flank pain .  -Continue hydration -Urinalysis not consistent with infection    DVT prophylaxis: SCD  Code Status: Full  Family Communication: Son present  Disposition Plan: Await findings from IR   Consultants:  Dr.Chelsea A Connor [facility]    Procedures/Significant Events:    12/20 CT abdomen pelvis wo contrast:Extensive fluid and soft tissue stranding/edema within the subcutaneous fat of the infraumbilical anterior and lateral abdominal wall. There are multifocal pockets of gas within the subcutaneous fat. Areas of increased density are present within the fluid and fat stranding suggestive of hematoma. Findings could be secondary to recent abdominal plasty, however the presence of infection cannot be ruled out. 2. Punctate stone in the left kidney as before. Dilated right extrarenal pelvis with mild left hydronephrosis, no evidence for ureteral stone. Findings could be secondary to marked bladder distention 3. No acute inflammatory process is visualized within the abdominal or pelvic cavity 4. Sigmoid colon diverticular disease without acute inflammation 5. Stable gastric banding.  No evidence for obstruction. 12/21 transfuse one unit PRBC   VENTILATOR SETTINGS: NA   Cultures 12/20 blood 2 pending    Antimicrobials: Zosyn 12/20>> Vancomycin 12/20>>   Devices    LINES / TUBES:      Continuous Infusions: . sodium chloride 125 mL/hr at 01/26/16 0800     Objective: Vitals:   01/26/16 0400 01/26/16 0500 01/26/16 0600 01/26/16 0747  BP: (!) 114/55 (!) 111/59 (!) 112/59 (!) 125/58  Pulse: 88 87 93 92  Resp: (!) 24 (!) 25 (!) 23 (!) 21  Temp:    99.8 F (37.7 C)  TempSrc:    Oral  SpO2: 98% 99% 100% 100%  Weight:      Height:        Intake/Output Summary (Last 24 hours) at 01/26/16 [id number] Last data filed at 01/26/16 0800  Gross per 24 hour  Intake             2965 ml  Output             1550 ml  Net             1415 ml   Filed Weights   01/25/16 1122  Weight: 114.3 kg (252 lb)    Examination:  General: Abdominal pain, No acute respiratory distress Eyes: negative scleral hemorrhage, negative anisocoria, negative icterus ENT: Negative Runny nose, negative gingival bleeding, Neck:  Negative scars, masses, torticollis,  lymphadenopathy, JVD Lungs: Clear to auscultation bilaterally without wheezes or crackles Cardiovascular: Regular rate and rhythm without murmur gallop or rub normal S1 and S2 Abdomen: Positive abdominal pain, positive distention, positive horizontal incision line with sanguinous discharge on the LLQ, palpated LLQ and RLQ hematoma,,  Extremities: No significant cyanosis, clubbing, or edema bilateral lower extremities Psychiatric:  Negative depression, negative anxiety, negative fatigue, negative mania  Central nervous system:  Cranial nerves II through XII intact, tongue/uvula midline, all extremities muscle strength 5/5, sensation intact throughout, negative dysarthria, negative expressive aphasia, negative receptive aphasia.  .     Data Reviewed: Care during the described time interval was provided by me .  I have reviewed this patient's available data, including medical history, events of note, physical examination, and all test results as part of my evaluation. I have personally reviewed and interpreted all radiology studies.  CBC:  Recent Labs Lab  01/25/16 1127 01/26/16 0614  WBC 11.0* 5.6  NEUTROABS 9.1*  --   HGB 10.1* 8.3*  HCT 32.0* 26.4*  MCV 83.3 84.6  PLT 223 0000000   Basic Metabolic Panel:  Recent Labs Lab 01/25/16 1127 01/26/16 0614  NA 135 139  K 3.6 3.7  CL 104 110  CO2 22 24  GLUCOSE 116* 88  BUN 19 9  CREATININE 0.79 0.67  CALCIUM 8.4* 7.7*   GFR: Estimated Creatinine Clearance: 100.8 mL/min (by C-G formula based on SCr of 0.67 mg/dL). Liver Function Tests:  Recent Labs Lab 01/25/16 1127 01/26/16 0614  AST 17 12*  ALT 15 11*  ALKPHOS 59 49  BILITOT 1.7* 1.4*  PROT 5.7* 5.1*  ALBUMIN 2.9* 2.3*   No results for input(s): LIPASE, AMYLASE in the last 168 hours. No results for input(s): AMMONIA in the last 168 hours. Coagulation Profile:  Recent Labs Lab 01/25/16 1127 01/26/16 0614  INR 1.58 1.36   Cardiac Enzymes:  Recent Labs Lab  01/25/16 1912 01/25/16 2217  TROPONINI <0.03 <0.03   BNP (last 3 results) No results for input(s): PROBNP in the last 8760 hours. HbA1C:  Recent Labs  01/25/16 1911  HGBA1C <4.2*   CBG:  Recent Labs Lab 01/25/16 1825 01/25/16 2149 01/26/16 0807  GLUCAP 95 149* 80   Lipid Profile: No results for input(s): CHOL, HDL, LDLCALC, TRIG, CHOLHDL, LDLDIRECT in the last 72 hours. Thyroid Function Tests: No results for input(s): TSH, T4TOTAL, FREET4, T3FREE, THYROIDAB in the last 72 hours. Anemia Panel: No results for input(s): VITAMINB12, FOLATE, FERRITIN, TIBC, IRON, RETICCTPCT in the last 72 hours. Urine analysis:    Component Value Date/Time   COLORURINE YELLOW 01/25/2016 [place] 01/25/2016 1447   LABSPEC 1.014 01/25/2016 1447   PHURINE 5.0 01/25/2016 1447   GLUCOSEU NEGATIVE 01/25/2016 1447   GLUCOSEU NEGATIVE 09/04/2013 1605   HGBUR NEGATIVE 01/25/2016 1447   BILIRUBINUR NEGATIVE 01/25/2016 1447   KETONESUR NEGATIVE 01/25/2016 1447   PROTEINUR NEGATIVE 01/25/2016 1447   UROBILINOGEN 1.0 09/04/2013 1605   NITRITE NEGATIVE 01/25/2016 1447   LEUKOCYTESUR NEGATIVE 01/25/2016 1447   Sepsis Labs: @LABRCNTIP (procalcitonin:4,lacticidven:4)  ) Recent Results (from the past 240 hour(s))  MRSA PCR Screening     Status: None   Collection Time: 01/25/16  5:49 PM  Result Value Ref Range Status   MRSA by PCR NEGATIVE NEGATIVE Final    Comment:        The GeneXpert MRSA Assay (FDA approved for NASAL specimens only), is one component of a comprehensive MRSA colonization surveillance program. It is not intended to diagnose MRSA infection nor to guide or monitor treatment for MRSA infections.          Radiology Studies: Ct Abdomen Pelvis Wo Contrast  Result Date: 01/25/2016 CLINICAL DATA:  Recent abdominal plasty with fever and diffuse lower abdominal pain EXAM: CT ABDOMEN AND PELVIS WITHOUT CONTRAST TECHNIQUE: Multidetector CT imaging of the  abdomen and pelvis was performed following the standard protocol without IV contrast. COMPARISON:  01/05/2016 FINDINGS: Lower chest: The lung bases demonstrate minimal atelectasis. No acute consolidation or pleural effusion is visualized. Heart size is nonenlarged. Hepatobiliary: No focal hepatic abnormality. Surgical clips in the gallbladder fossa. No intra hepatic biliary dilatation. Mildly prominent extrahepatic common bile duct at the porta hepatis is fell postsurgical. Pancreas: Unremarkable. No pancreatic ductal dilatation or surrounding inflammatory changes. Spleen: Normal in size without focal abnormality. Adrenals/Urinary Tract: Adrenal glands are within normal limits. Dilated right extrarenal pelvis. Mild  left hydronephrosis. There is a 3-4 mm stone within the lower left kidney. There is no evidence for ureteral stone. The bladder is enlarged. Stomach/Bowel: Stomach demonstrates evidence of prior laparoscopic banding. There is a small to moderate hiatal hernia. No evidence for dilated small bowel. Moderate stool in the colon. No colon wall thickening. Diverticular disease of the sigmoid colon without acute inflammation. Vascular/Lymphatic: Atherosclerosis of the aorta. No significantly enlarged lymph nodes. Reproductive: Uterus and bilateral adnexa are unremarkable. Other: Extensive fluid and soft tissue stranding within the infraumbilical abdominal wall. Multifocal areas of increased attenuation suggestive of hemorrhage. Multiple pockets of gas present within the subcutaneous with more focal gas collection within the subcutaneous fat of the right lower quadrant. Small pockets of subcutaneous gas present deep to the umbilicus, soft tissue thickening is present at the umbilicus. No free air Musculoskeletal: There are degenerative changes of the spine. No acute osseous abnormality. IMPRESSION: 1. Extensive fluid and soft tissue stranding/edema within the subcutaneous fat of the infraumbilical anterior and  lateral abdominal wall. There are multifocal pockets of gas within the subcutaneous fat. Areas of increased density are present within the fluid and fat stranding suggestive of hematoma. Findings could be secondary to recent abdominal plasty, however the presence of infection cannot be ruled out. 2. Punctate stone in the left kidney as before. Dilated right extrarenal pelvis with mild left hydronephrosis, no evidence for ureteral stone. Findings could be secondary to marked bladder distention 3. No acute inflammatory process is visualized within the abdominal or pelvic cavity 4. Sigmoid colon diverticular disease without acute inflammation 5. Stable gastric banding.  No evidence for obstruction. Electronically Signed   By: Donavan Foil M.D.   On: 01/25/2016 14:50   Dg Chest 2 View  Result Date: 01/25/2016 CLINICAL DATA:  Fever.  Recent surgery. EXAM: CHEST  2 VIEW COMPARISON:  05/04/2015 FINDINGS: The heart size and mediastinal contours are within normal limits. Both lungs are clear. The visualized skeletal structures are unremarkable. IMPRESSION: No active cardiopulmonary disease. Electronically Signed   By: Franchot Gallo M.D.   On: 01/25/2016 13:25        Scheduled Meds: . escitalopram  10 mg Oral Daily  . Influenza vac split quadrivalent PF  0.5 mL Intramuscular Tomorrow-1000  . insulin aspart  0-9 Units Subcutaneous TID WC  . metoprolol tartrate  12.5 mg Oral BID  . piperacillin-tazobactam (ZOSYN)  IV  3.375 g Intravenous Q8H  . sodium chloride flush  3 mL Intravenous Q12H  . vancomycin  1,000 mg Intravenous Q12H   Continuous Infusions: . sodium chloride 125 mL/hr at 01/26/16 0800     LOS: 1 day    Time spent: 40 minutes    WOODS, Geraldo Docker, MD [facility] Pager [phone]   If 7PM-7AM, please contact night-coverage [email or web address] Password [facility] 01/26/2016, 9:27 AM

## 2016-01-26 NOTE — Progress Notes (Signed)
S: She is in much better spirits this morning, still c/o some dizziness when she stands up.   Vitals, labs, intake/output, and orders reviewed at this time. Good uop. WBC normalized.   Gen: A&Ox3, no distress  H&N: EOMI, atraumatic, neck supple Chest: unlabored respirations, RRR Abd: soft, nontender, nondistended, incision c/d/i with extensive ecchymosis. Sanguinous drainage from left lateral aspect of incision Ext: warm, no edema Neuro: grossly normal  Lines/tubes/drains: PIV  A/P:  S/p abdominoplasty in [place] 12/11 with likely infected hematoma. Continue fluid resus/ IV abx, IR consult for aspiration/possible drain placement and culture of bilateral subcu fluid pockets   Romana Juniper, MD [facility], [place] Pager [phone]

## 2016-01-26 NOTE — Progress Notes (Signed)
Pt hgb dropped from 10-8.3 today. Pt very symtpomatic. bp is soft. MD aware. Order to transfuse blood. Will continue to monitor.

## 2016-01-27 ENCOUNTER — Inpatient Hospital Stay (HOSPITAL_COMMUNITY): Payer: 59

## 2016-01-27 DIAGNOSIS — I82401 Acute embolism and thrombosis of unspecified deep veins of right lower extremity: Secondary | ICD-10-CM

## 2016-01-27 DIAGNOSIS — E1121 Type 2 diabetes mellitus with diabetic nephropathy: Secondary | ICD-10-CM

## 2016-01-27 DIAGNOSIS — I480 Paroxysmal atrial fibrillation: Secondary | ICD-10-CM

## 2016-01-27 DIAGNOSIS — S3692XA Contusion of unspecified intra-abdominal organ, initial encounter: Secondary | ICD-10-CM

## 2016-01-27 DIAGNOSIS — Z9889 Other specified postprocedural states: Secondary | ICD-10-CM

## 2016-01-27 LAB — GLUCOSE, CAPILLARY
GLUCOSE-CAPILLARY: 111 mg/dL — AB (ref 65–99)
GLUCOSE-CAPILLARY: 104 mg/dL — AB (ref 65–99)
Glucose-Capillary: 69 mg/dL (ref 65–99)
Glucose-Capillary: 78 mg/dL (ref 65–99)
Glucose-Capillary: 112 mg/dL — ABNORMAL HIGH (ref 65–99)

## 2016-01-27 LAB — HEMOGLOBIN
HEMOGLOBIN: 8.8 g/dL — AB (ref 12.0–15.0)
Hemoglobin: 8.6 g/dL — ABNORMAL LOW (ref 12.0–15.0)
Hemoglobin: 8.7 g/dL — ABNORMAL LOW (ref 12.0–15.0)

## 2016-01-27 LAB — TYPE AND SCREEN
ABO/RH(D): O POS
ANTIBODY SCREEN: NEGATIVE
Unit division: 0

## 2016-01-27 MED ORDER — MIDAZOLAM HCL 2 MG/2ML IJ SOLN
INTRAMUSCULAR | Status: AC
  Filled 2016-01-27: qty 2

## 2016-01-27 MED ORDER — LIDOCAINE HCL (PF) 1 % IJ SOLN
INTRAMUSCULAR | Status: AC
Start: 2016-01-27 — End: 2016-01-27
  Filled 2016-01-27: qty 30

## 2016-01-27 MED ORDER — ACETAMINOPHEN 325 MG PO TABS
ORAL_TABLET | ORAL | Status: AC
  Filled 2016-01-27: qty 2

## 2016-01-27 MED ORDER — FENTANYL CITRATE (PF) 100 MCG/2ML IJ SOLN
INTRAMUSCULAR | Status: AC
  Filled 2016-01-27: qty 2

## 2016-01-27 MED ORDER — DEXTROSE 50 % IV SOLN
INTRAVENOUS | Status: AC
  Administered 2016-01-27: 25 mL
  Filled 2016-01-27: qty 50

## 2016-01-27 MED ORDER — DEXTROSE 50 % IV SOLN: 25.0000 mL | Freq: Once | INTRAVENOUS | Status: AC

## 2016-01-27 MED ORDER — MIDAZOLAM HCL 2 MG/2ML IJ SOLN
INTRAMUSCULAR | Status: AC | PRN
  Administered 2016-01-27: 1 mg via INTRAVENOUS

## 2016-01-27 MED ORDER — FENTANYL CITRATE (PF) 100 MCG/2ML IJ SOLN
INTRAMUSCULAR | Status: AC | PRN
  Administered 2016-01-27: 25 ug via INTRAVENOUS

## 2016-01-27 NOTE — Procedures (Signed)
Interventional Radiology Procedure Note  Procedure: CT guided aspiration of abdominal wall hematoma.  ~10cc of dark sero-sanguinous fluid to lab.   Complications: None Recommendations:  - Follow up labs. - Do not submerge - Routine care   Signed,  Dulcy Fanny. Earleen Newport, DO

## 2016-01-27 NOTE — Progress Notes (Signed)
PROGRESS NOTE    [name]  [id number] DOB: [birth year] DOA: 01/25/2016 PCP: [facility]   Brief Narrative:  55 y.o. WF PMHx  A-fib, PE/DVT on Xarelto, DM2 controlled with complication, PVD, HLD,Bleeding ulcer, Anemia  Presenting via EMS with c/p post op complications after abdominoplasty done in [place] in 12/11. She had been discharged on 12/12 with Cipro but her status as complicated by abdominal hematoma. SHe reported returning to the hospital while there for hematoma drainage.She continued taking Cipro on her own and flew back today to the [place]. She reports odorous, dark blood drainage in the RLQ. She was dizzy and dehydrated, feeling weak  . Denies any fever, chills, night sweats, nausea or vomiting. No chest pain or shortness of breath . Denies dysuria, hematuria or trouble with bowel movements. Of note, she did report right flank discomfort during her flight . She denies any lower extremity swelling.     ED Course:  BP 124/57   Pulse 85   Temp 99.8 F (37.7 C) (Oral)   Resp (!) 27   Ht 5\' 6"  (1.676 m)   Wt 114.3 kg (252 lb)   SpO2 100%   BMI 40.67 kg/m    white count 11 hemoglobin 10.1 platelets 223 calcium 8.4 INR 1.58  glucose 116  lactic acid went  from 2.09 to  2.30.  Vitals signs are stable at this time, but T max is 99.8. (was 11.8 on arrival )  CT of the abdomen shows Extensive fluid and soft tissue stranding/edema within the subcutaneous fat of the infraumbilical anterior and lateral abdominal wall. There are multifocal pockets of gas within the subcutaneous fat. Areas of increased density are present within thefluid and fat stranding suggestive of hematoma. Findings could besecondary to recent abdominal plasty, however the presence ofinfection cannot be ruled out. CXR NAD    Subjective: 12/22 A/O 4, positive abdominal pain but significantly decreased. States still some mild dizziness when ambulating. Negative CP, negative SOB.   Assessment  & Plan:   Active Problems:   Diabetes mellitus ([place])   Pulmonary embolism (HCC)   ASD (atrial septal defect)   Wound infection   Hydronephrosis, left   Hydronephrosis, right   DVT (deep venous thrombosis) (HCC)   Atrial fibrillation ([place])   Diabetes mellitus with complication (HCC)   Wound infection after surgery, initial encounter   Abdominal Hematoma with drainage, suspect infectious s/p abdominoplasty on 12/11 in [place] .  -Afebrile last 24 hours - Abd CT shows  Extensive fluid and soft tissue stranding/edema within the subcutaneous fat of the infraumbilical anterior and lateral abdominal wall with multifocal pockets of gas within the subcutaneous fat.  CT drainage image pending Received Vanc/Zosyn and IVF .  -12/22 S/P IR IR aspiration hematoma .  -Continue Vanc Zosyn  -continue normal saline 125 cc/h    History of Pulmonary Embolism  -on Xarelto since 2015 currently on hold   Atrial Fibrillation (CHA2DS2-VASc score 3 , ) -Xarelto on hold .  -No EKG or Tn available for review.  -Currently in NSR   Type II Diabetes  -Current blood sugar level is 116  Acute Blood loss Anemia  -Symptomatic Recent Labs Lab 01/25/16 1127 01/26/16 0614 01/26/16 1849 01/27/16 0300 01/27/16 1146  HGB 10.1* 8.3* 9.7* 8.6* 8.8*  -Transfuse for hemoglobin <8  -Hg q 8hr -12/21 transfuse one unit PRBC  Right Hydronephrosis  CT of the abdomen shows  -Dilated right extrarenal pelvis. Mild left hydronephrosis.  4 mm stone  within the lower left kidney.  Denies dysuria or hematuria. Denies  Flank pain .  -Continue hydration -Urinalysis not consistent with infection    DVT prophylaxis: SCD  Code Status: Full  Family Communication: Son present  Disposition Plan: Await findings from IR   Consultants:  Dr.Chelsea A Connor CCS D.O.Jaime Wagner IR    Procedures/Significant Events:  12/20 CT abdomen pelvis wo contrast:Extensive fluid and soft tissue stranding/edema within  the subcutaneous fat of the infraumbilical anterior and lateral abdominal wall. There are multifocal pockets of gas within the subcutaneous fat. Areas of increased density are present within the fluid and fat stranding suggestive of hematoma. Findings could be secondary to recent abdominal plasty, however the presence of infection cannot be ruled out. 2. Punctate stone in the left kidney as before. Dilated right extrarenal pelvis with mild left hydronephrosis, no evidence for ureteral stone. Findings could be secondary to marked bladder distention 3. No acute inflammatory process is visualized within the abdominal or pelvic cavity 4. Sigmoid colon diverticular disease without acute inflammation 5. Stable gastric banding.  No evidence for obstruction. 12/21 transfuse one unit PRBC 12/22 IR CT guided aspiration of abdominal wall hematoma~10 mL  VENTILATOR SETTINGS: NA   Cultures 12/20 blood 2 pending    Antimicrobials: Anti-infectives    Start     Dose/Rate Route Stop   01/29/16 0200  vancomycin (VANCOCIN) 1,500 mg in sodium chloride 0.9 % 500 mL IVPB  Status:  Discontinued     1,500 mg 250 mL/hr over 120 Minutes Intravenous 01/28/16 1527   01/28/16 1600  vancomycin (VANCOCIN) 1,500 mg in sodium chloride 0.9 % 500 mL IVPB     1,500 mg 250 mL/hr over 120 Minutes Intravenous     01/26/16 0130  vancomycin (VANCOCIN) IVPB 1000 mg/200 mL premix  Status:  Discontinued     1,000 mg 200 mL/hr over 60 Minutes Intravenous 01/28/16 1507   01/25/16 2200  piperacillin-tazobactam (ZOSYN) IVPB 3.375 g     3.375 g 12.5 mL/hr over 240 Minutes Intravenous     01/25/16 1215  vancomycin (VANCOCIN) 2,500 mg in sodium chloride 0.9 % 500 mL IVPB     2,500 mg 250 mL/hr over 120 Minutes Intravenous 01/25/16 1603   01/25/16 1215  piperacillin-tazobactam (ZOSYN) IVPB 3.375 g     3.375 g 100 mL/hr over 30 Minutes Intravenous 01/25/16 1321      Devices    LINES / TUBES:      Continuous  Infusions: . sodium chloride 125 mL/hr at 01/27/16 1830     Objective: Vitals:   01/27/16 1057 01/27/16 1121 01/27/16 1544 01/27/16 1902  BP: (!) 110/59 126/71 103/63 109/68  Pulse: 78 74 81 80  Resp: (!) 22 20 16 15   Temp:  98.2 F (36.8 C) 98.5 F (36.9 C) 99 F (37.2 C)  TempSrc:  Oral Oral Oral  SpO2: 100% 99% 100% 100%  Weight:      Height:        Intake/Output Summary (Last 24 hours) at 01/27/16 1948 Last data filed at 01/27/16 1800  Gross per 24 hour  Intake             3820 ml  Output             1375 ml  Net             2445 ml   Filed Weights   01/25/16 1122 01/27/16 0310  Weight: 114.3 kg (252 lb) 119.7 kg (264 lb)  Examination:  General: Abdominal pain, No acute respiratory distress Eyes: negative scleral hemorrhage, negative anisocoria, negative icterus ENT: Negative Runny nose, negative gingival bleeding, Neck:  Negative scars, masses, torticollis, lymphadenopathy, JVD Lungs: Clear to auscultation bilaterally without wheezes or crackles Cardiovascular: Regular rate and rhythm without murmur gallop or rub normal S1 and S2 Abdomen: Positive abdominal pain, positive distention, positive horizontal incision line with sanguinous discharge on the LLQ, palpated LLQ and RLQ hematoma,,  Extremities: No significant cyanosis, clubbing, or edema bilateral lower extremities Psychiatric:  Negative depression, negative anxiety, negative fatigue, negative mania  Central nervous system:  Cranial nerves II through XII intact, tongue/uvula midline, all extremities muscle strength 5/5, sensation intact throughout, negative dysarthria, negative expressive aphasia, negative receptive aphasia.  .     Data Reviewed: Care during the described time interval was provided by me .  I have reviewed this patient's available data, including medical history, events of note, physical examination, and all test results as part of my evaluation. I have personally reviewed and interpreted  all radiology studies.  CBC:  Recent Labs Lab 01/25/16 1127 01/26/16 0614 01/26/16 1849 01/27/16 0300 01/27/16 1146  WBC 11.0* 5.6  --   --   --   NEUTROABS 9.1*  --   --   --   --   HGB 10.1* 8.3* 9.7* 8.6* 8.8*  HCT 32.0* 26.4*  --   --   --   MCV 83.3 84.6  --   --   --   PLT 223 179  --   --   --    Basic Metabolic Panel:  Recent Labs Lab 01/25/16 1127 01/26/16 0614  NA 135 139  K 3.6 3.7  CL 104 110  CO2 22 24  GLUCOSE 116* 88  BUN 19 9  CREATININE 0.79 0.67  CALCIUM 8.4* 7.7*   GFR: Estimated Creatinine Clearance: 103.5 mL/min (by C-G formula based on SCr of 0.67 mg/dL). Liver Function Tests:  Recent Labs Lab 01/25/16 1127 01/26/16 0614  AST 17 12*  ALT 15 11*  ALKPHOS 59 49  BILITOT 1.7* 1.4*  PROT 5.7* 5.1*  ALBUMIN 2.9* 2.3*   No results for input(s): LIPASE, AMYLASE in the last 168 hours. No results for input(s): AMMONIA in the last 168 hours. Coagulation Profile:  Recent Labs Lab 01/25/16 1127 01/26/16 0614  INR 1.58 1.36   Cardiac Enzymes:  Recent Labs Lab 01/25/16 1912 01/25/16 2217  TROPONINI <0.03 <0.03   BNP (last 3 results) No results for input(s): PROBNP in the last 8760 hours. HbA1C:  Recent Labs  01/25/16 1911  HGBA1C <4.2*   CBG:  Recent Labs Lab 01/26/16 2107 01/27/16 0749 01/27/16 0828 01/27/16 1154 01/27/16 1617  GLUCAP 99 69 111* 78 104*   Lipid Profile: No results for input(s): CHOL, HDL, LDLCALC, TRIG, CHOLHDL, LDLDIRECT in the last 72 hours. Thyroid Function Tests: No results for input(s): TSH, T4TOTAL, FREET4, T3FREE, THYROIDAB in the last 72 hours. Anemia Panel: No results for input(s): VITAMINB12, FOLATE, FERRITIN, TIBC, IRON, RETICCTPCT in the last 72 hours. Urine analysis:    Component Value Date/Time   COLORURINE YELLOW 01/25/2016 [place] 01/25/2016 1447   LABSPEC 1.014 01/25/2016 1447   PHURINE 5.0 01/25/2016 1447   GLUCOSEU NEGATIVE 01/25/2016 1447   GLUCOSEU  NEGATIVE 09/04/2013 1605   HGBUR NEGATIVE 01/25/2016 1447   BILIRUBINUR NEGATIVE 01/25/2016 1447   KETONESUR NEGATIVE 01/25/2016 1447   PROTEINUR NEGATIVE 01/25/2016 1447   UROBILINOGEN 1.0 09/04/2013 1605  NITRITE NEGATIVE 01/25/2016 1447   LEUKOCYTESUR NEGATIVE 01/25/2016 1447   Sepsis Labs: @LABRCNTIP (procalcitonin:4,lacticidven:4)  ) Recent Results (from the past 240 hour(s))  Blood Culture (routine x 2)     Status: None (Preliminary result)   Collection Time: 01/25/16 11:20 AM  Result Value Ref Range Status   Specimen Description BLOOD RIGHT HAND  Final   Special Requests BOTTLES DRAWN AEROBIC AND ANAEROBIC  5CC  Final   Culture NO GROWTH 2 DAYS  Final   Report Status PENDING  Incomplete  Blood Culture (routine x 2)     Status: None (Preliminary result)   Collection Time: 01/25/16 12:37 PM  Result Value Ref Range Status   Specimen Description BLOOD RIGHT ANTECUBITAL  Final   Special Requests BOTTLES DRAWN AEROBIC AND ANAEROBIC  5CC  Final   Culture NO GROWTH 2 DAYS  Final   Report Status PENDING  Incomplete  Urine culture     Status: Abnormal   Collection Time: 01/25/16  2:47 PM  Result Value Ref Range Status   Specimen Description URINE, RANDOM  Final   Special Requests NONE  Final   Culture <10,000 COLONIES/mL INSIGNIFICANT GROWTH (A)  Final   Report Status 01/26/2016 FINAL  Final  MRSA PCR Screening     Status: None   Collection Time: 01/25/16  5:49 PM  Result Value Ref Range Status   MRSA by PCR NEGATIVE NEGATIVE Final    Comment:        The GeneXpert MRSA Assay (FDA approved for NASAL specimens only), is one component of a comprehensive MRSA colonization surveillance program. It is not intended to diagnose MRSA infection nor to guide or monitor treatment for MRSA infections.   Aerobic/Anaerobic Culture (surgical/deep wound)     Status: None (Preliminary result)   Collection Time: 01/27/16 12:41 PM  Result Value Ref Range Status   Specimen Description  ABDOMEN  Final   Special Requests NONE  Final   Gram Stain   Final    ABUNDANT WBC PRESENT, PREDOMINANTLY PMN FEW GRAM NEGATIVE RODS RARE GRAM POSITIVE COCCI IN PAIRS RARE GRAM POSITIVE COCCOBACILLUS    Culture PENDING  Incomplete   Report Status PENDING  Incomplete         Radiology Studies: Ct Aspiration  Result Date: 01/27/2016 INDICATION: 55 year old female with a history of tummy tuck, with abdominal wall hematoma and possible infection. EXAM: CT GUIDANCE NEEDLE PLACEMENT MEDICATIONS: The patient is currently admitted to the hospital and receiving intravenous antibiotics. The antibiotics were administered within an appropriate time frame prior to the initiation of the procedure. ANESTHESIA/SEDATION: Fentanyl 25 mcg IV; Versed 1.0 mg IV Moderate Sedation Time:  10 The patient was continuously monitored during the procedure by the interventional radiology nurse under my direct supervision. COMPLICATIONS: None PROCEDURE: Informed written consent was obtained from the patient after a thorough discussion of the procedural risks, benefits and alternatives. All questions were addressed. Maximal Sterile Barrier Technique was utilized including caps, mask, sterile gowns, sterile gloves, sterile drape, hand hygiene and skin antiseptic. A timeout was performed prior to the initiation of the procedure. Patient is positioned supine position on CT gantry table. Scout CT was performed for planning purposes. The skin and subcutaneous tissues overlying the right lower abdomen were prepped and draped in the usual sterile fashion. The skin and subcutaneous tissues were generously infiltrated 1% lidocaine for local anesthesia. Using CT guidance, Yueh needle was advanced into the fluid collection and an aspiration was achieved of approximately 10 cc of dark serosanguineous  fluid. Sample sent the lab for analysis. Catheter was removed and a sterile bandage was placed. Patient tolerated the procedure well and  remained hemodynamically stable throughout. No complications were encountered and no significant blood loss. IMPRESSION: Status post CT-guided aspiration of abdominal wall hematoma. Sample was sent to the lab for culture. Signed, Dulcy Fanny. Earleen Newport, DO [facility] Radiology Electronically Signed   By: Corrie Mckusick D.O.   On: 01/27/2016 11:51        Scheduled Meds: . sodium chloride   Intravenous Once  . escitalopram  10 mg Oral Daily  . Influenza vac split quadrivalent PF  0.5 mL Intramuscular Tomorrow-1000  . insulin aspart  0-9 Units Subcutaneous TID WC  . metoprolol tartrate  12.5 mg Oral BID  . piperacillin-tazobactam (ZOSYN)  IV  3.375 g Intravenous Q8H  . sodium chloride flush  3 mL Intravenous Q12H  . vancomycin  1,000 mg Intravenous Q12H   Continuous Infusions: . sodium chloride 125 mL/hr at 01/27/16 1830     LOS: 2 days    Time spent: 40 minutes    Toneshia Coello, Geraldo Docker, MD [facility] Pager [phone]   If 7PM-7AM, please contact night-coverage [email or web address] Password [email or web address] 01/27/2016, 7:48 PM

## 2016-01-27 NOTE — Progress Notes (Signed)
[facility] Progress Note     Subjective: Denies abdominal pain, c/o weakness and near-syncope. Reports continued and worsening oozing from lateral aspect of abdominal incisions bilaterally. Having bowel movements and urinating without hesitancy. Patient with persistent fever and chills.  Objective: Vital signs in last 24 hours:  Temp:  [98.6 F (37 C)-100.8 F (38.2 C)] 98.6 F (37 C) (12/22 0715) Pulse Rate:  [75-92] 78 (12/22 1057) Resp:  [16-27] 22 (12/22 1057) BP: (102-128)/(56-78) 110/59 (12/22 1057) SpO2:  [97 %-100 %] 100 % (12/22 1057) Weight:  [264 lb (119.7 kg)] 264 lb (119.7 kg) (12/22 0310) Last BM Date: 01/18/16  Intake/Output from previous day: 12/21 0701 - 12/22 0700 In: 3154 [I.V.:2250; Blood:354; IV Piggyback:550] Out: 800 [Urine:800] Intake/Output this shift: Total I/O In: 250 [I.V.:250] Out: 550 [Urine:550]  PE: Gen:  Alert, NAD, pleasant and cooperative Pulm:  Unlabored, CTA, no W/R/R Abd: Soft, appropriately tender, ND, extensive ecchymosis over lower abdominal wall and surgical incision w/ active oozing from left lateral aspect of incision and saturated ABD over right lateral aspect of incison, +BS  Ext:  No erythema, edema, or tenderness, warm and dry  Lab Results:   Recent Labs  01/25/16 1127 01/26/16 0614 01/26/16 1849 01/27/16 0300  WBC 11.0* 5.6  --   --   HGB 10.1* 8.3* 9.7* 8.6*  HCT 32.0* 26.4*  --   --   PLT 223 179  --   --    BMET  Recent Labs  01/25/16 1127 01/26/16 0614  NA 135 139  K 3.6 3.7  CL 104 110  CO2 22 24  GLUCOSE 116* 88  BUN 19 9  CREATININE 0.79 0.67  CALCIUM 8.4* 7.7*   PT/INR  Recent Labs  01/25/16 1127 01/26/16 0614  LABPROT 19.1* 16.9*  INR 1.58 1.36   CMP     Component Value Date/Time   NA 139 01/26/2016 0614   K 3.7 01/26/2016 0614   CL 110 01/26/2016 0614   CO2 24 01/26/2016 0614   GLUCOSE 88 01/26/2016 0614   BUN 9 01/26/2016 0614   CREATININE 0.67 01/26/2016 0614    CREATININE 0.63 08/24/2010 1107   CALCIUM 7.7 (L) 01/26/2016 0614   PROT 5.1 (L) 01/26/2016 0614   ALBUMIN 2.3 (L) 01/26/2016 0614   AST 12 (L) 01/26/2016 0614   ALT 11 (L) 01/26/2016 0614   ALKPHOS 49 01/26/2016 0614   BILITOT 1.4 (H) 01/26/2016 0614   GFRNONAA >60 01/26/2016 0614   GFRAA >60 01/26/2016 0614   Lipase     Component Value Date/Time   LIPASE 15 10/06/2010 2015   Studies/Results: Ct Abdomen Pelvis Wo Contrast  Result Date: 01/25/2016 CLINICAL DATA:  Recent abdominal plasty with fever and diffuse lower abdominal pain EXAM: CT ABDOMEN AND PELVIS WITHOUT CONTRAST TECHNIQUE: Multidetector CT imaging of the abdomen and pelvis was performed following the standard protocol without IV contrast. COMPARISON:  01/05/2016 FINDINGS: Lower chest: The lung bases demonstrate minimal atelectasis. No acute consolidation or pleural effusion is visualized. Heart size is nonenlarged. Hepatobiliary: No focal hepatic abnormality. Surgical clips in the gallbladder fossa. No intra hepatic biliary dilatation. Mildly prominent extrahepatic common bile duct at the porta hepatis is fell postsurgical. Pancreas: Unremarkable. No pancreatic ductal dilatation or surrounding inflammatory changes. Spleen: Normal in size without focal abnormality. Adrenals/Urinary Tract: Adrenal glands are within normal limits. Dilated right extrarenal pelvis. Mild left hydronephrosis. There is a 3-4 mm stone within the lower left kidney. There is no evidence for ureteral stone.  The bladder is enlarged. Stomach/Bowel: Stomach demonstrates evidence of prior laparoscopic banding. There is a small to moderate hiatal hernia. No evidence for dilated small bowel. Moderate stool in the colon. No colon wall thickening. Diverticular disease of the sigmoid colon without acute inflammation. Vascular/Lymphatic: Atherosclerosis of the aorta. No significantly enlarged lymph nodes. Reproductive: Uterus and bilateral adnexa are unremarkable. Other:  Extensive fluid and soft tissue stranding within the infraumbilical abdominal wall. Multifocal areas of increased attenuation suggestive of hemorrhage. Multiple pockets of gas present within the subcutaneous with more focal gas collection within the subcutaneous fat of the right lower quadrant. Small pockets of subcutaneous gas present deep to the umbilicus, soft tissue thickening is present at the umbilicus. No free air Musculoskeletal: There are degenerative changes of the spine. No acute osseous abnormality. IMPRESSION: 1. Extensive fluid and soft tissue stranding/edema within the subcutaneous fat of the infraumbilical anterior and lateral abdominal wall. There are multifocal pockets of gas within the subcutaneous fat. Areas of increased density are present within the fluid and fat stranding suggestive of hematoma. Findings could be secondary to recent abdominal plasty, however the presence of infection cannot be ruled out. 2. Punctate stone in the left kidney as before. Dilated right extrarenal pelvis with mild left hydronephrosis, no evidence for ureteral stone. Findings could be secondary to marked bladder distention 3. No acute inflammatory process is visualized within the abdominal or pelvic cavity 4. Sigmoid colon diverticular disease without acute inflammation 5. Stable gastric banding.  No evidence for obstruction. Electronically Signed   By: Donavan Foil M.D.   On: 01/25/2016 14:50   Dg Chest 2 View  Result Date: 01/25/2016 CLINICAL DATA:  Fever.  Recent surgery. EXAM: CHEST  2 VIEW COMPARISON:  05/04/2015 FINDINGS: The heart size and mediastinal contours are within normal limits. Both lungs are clear. The visualized skeletal structures are unremarkable. IMPRESSION: No active cardiopulmonary disease. Electronically Signed   By: Franchot Gallo M.D.   On: 01/25/2016 13:25    Anti-infectives: Anti-infectives    Start     Dose/Rate Route Frequency Ordered Stop   01/26/16 0130  vancomycin  (VANCOCIN) IVPB 1000 mg/200 mL premix     1,000 mg 200 mL/hr over 60 Minutes Intravenous Every 12 hours 01/25/16 1318     01/25/16 2200  piperacillin-tazobactam (ZOSYN) IVPB 3.375 g     3.375 g 12.5 mL/hr over 240 Minutes Intravenous Every 8 hours 01/25/16 1318     01/25/16 1215  vancomycin (VANCOCIN) 2,500 mg in sodium chloride 0.9 % 500 mL IVPB     2,500 mg 250 mL/hr over 120 Minutes Intravenous  Once 01/25/16 1203 01/25/16 1603   01/25/16 1215  piperacillin-tazobactam (ZOSYN) IVPB 3.375 g     3.375 g 100 mL/hr over 30 Minutes Intravenous  Once 01/25/16 1203 01/25/16 1321     Assessment/Plan Abdominal Hematoma with drainage, suspect infectious s/p abdominoplasty on 12/11 in [place] S/P CT guided aspiration of abdominal wall hematoma by IR 01/27/16 - 10 cc dark serosanguinous fluid; Cx pending  Afebrile, VSS  Continue IV abx and IVF  Follow cultures and CBC  WBC wnl  ABL anemia - s/p one unit pRBC 12/21; hgb 8.6  PMH PE on Xarelto DM2 afib R hydronephrosis - IVF, UA negative, good urine output  Plan: continue IV abx and follow cultures. Will order abdominal binder. Continue to hold blood thinning medications. Suspect abdominal hematoma is evacuating through patients surgical incisions - size of hematoma on CT today decreased compared to initial scan. Will  discuss further with MD.    LOS: 2 days    Jill Alexanders , [facility] 01/27/2016, 11:40 AM Pager: [phone] Consults: [phone] Mon-Fri 7:00 am-4:30 pm Sat-Sun 7:00 am-11:30 am

## 2016-01-27 NOTE — Progress Notes (Signed)
@  630 Pt to bathroom for BM. Pt called RN to bathroom and pt was found to be bleeding at new sites along abdominal incision. Pt moved back to bed. Approximately 100cc BRB in collection hat of toilet from pt's abdominal incision.  @640  after cleaning above paged K. Kirby-Graham of [facility] to notify of pt's new bleeding sites. No orders received, will continue to monitor.

## 2016-01-28 DIAGNOSIS — Z9889 Other specified postprocedural states: Secondary | ICD-10-CM

## 2016-01-28 DIAGNOSIS — I480 Paroxysmal atrial fibrillation: Secondary | ICD-10-CM

## 2016-01-28 DIAGNOSIS — E1121 Type 2 diabetes mellitus with diabetic nephropathy: Secondary | ICD-10-CM

## 2016-01-28 DIAGNOSIS — I2602 Saddle embolus of pulmonary artery with acute cor pulmonale: Secondary | ICD-10-CM

## 2016-01-28 DIAGNOSIS — S301XXA Contusion of abdominal wall, initial encounter: Secondary | ICD-10-CM

## 2016-01-28 DIAGNOSIS — I82402 Acute embolism and thrombosis of unspecified deep veins of left lower extremity: Secondary | ICD-10-CM

## 2016-01-28 LAB — BASIC METABOLIC PANEL
ANION GAP: 6 (ref 5–15)
BUN: 6 mg/dL (ref 6–20)
CHLORIDE: 109 mmol/L (ref 101–111)
CO2: 25 mmol/L (ref 22–32)
Calcium: 8.2 mg/dL — ABNORMAL LOW (ref 8.9–10.3)
Creatinine, Ser: 0.58 mg/dL (ref 0.44–1.00)
GFR calc non Af Amer: >60 mL/min (ref >60)
Glucose, Bld: 110 mg/dL — ABNORMAL HIGH (ref 65–99)
Potassium: 3.5 mmol/L (ref 3.5–5.1)
Sodium: 140 mmol/L (ref 135–145)

## 2016-01-28 LAB — GLUCOSE, CAPILLARY
Glucose-Capillary: 74 mg/dL (ref 65–99)
Glucose-Capillary: 82 mg/dL (ref 65–99)
Glucose-Capillary: 89 mg/dL (ref 65–99)

## 2016-01-28 LAB — VANCOMYCIN, TROUGH: Vancomycin Tr: 9 ug/mL — ABNORMAL LOW (ref 15–20)

## 2016-01-28 LAB — HEMOGLOBIN
HEMOGLOBIN: 8.3 g/dL — AB (ref 12.0–15.0)
Hemoglobin: 9.2 g/dL — ABNORMAL LOW (ref 12.0–15.0)
Hemoglobin: 8.8 g/dL — ABNORMAL LOW (ref 12.0–15.0)

## 2016-01-28 MED ORDER — VANCOMYCIN HCL 10 G IV SOLR: 1500.0000 mg | Freq: Two times a day (BID) | INTRAVENOUS | Status: DC

## 2016-01-28 MED ORDER — SODIUM CHLORIDE 0.9 % IV SOLN
1500.0000 mg | Freq: Two times a day (BID) | INTRAVENOUS | Status: DC
  Administered 2016-01-28 – 2016-01-30 (×4): 1500 mg via INTRAVENOUS
  Filled 2016-01-28 (×5): qty 1500

## 2016-01-28 MED ORDER — PANTOPRAZOLE SODIUM 40 MG PO TBEC
40.0000 mg | DELAYED_RELEASE_TABLET | Freq: Every day | ORAL | Status: DC
  Administered 2016-01-28 – 2016-01-30 (×3): 40 mg via ORAL
  Filled 2016-01-28 (×3): qty 1

## 2016-01-28 NOTE — Progress Notes (Addendum)
PROGRESS NOTE    [name]  [id number] DOB: 1960/07/19 DOA: 01/25/2016 PCP: [facility]   Brief Narrative:  55 y.o. WF PMHx  A-fib, PE/DVT on Xarelto, DM2 controlled with complication, PVD, HLD,Bleeding ulcer, Anemia  Presenting via EMS with c/p post op complications after abdominoplasty done in [place] in 12/11. She had been discharged on 12/12 with Cipro but her status as complicated by abdominal hematoma. SHe reported returning to the hospital while there for hematoma drainage.She continued taking Cipro on her own and flew back today to the [place]. She reports odorous, dark blood drainage in the RLQ. She was dizzy and dehydrated, feeling weak  . Denies any fever, chills, night sweats, nausea or vomiting. No chest pain or shortness of breath . Denies dysuria, hematuria or trouble with bowel movements. Of note, she did report right flank discomfort during her flight . She denies any lower extremity swelling.     ED Course:  BP 124/57   Pulse 85   Temp 99.8 F (37.7 C) (Oral)   Resp (!) 27   Ht 5\' 6"  (1.676 m)   Wt 114.3 kg (252 lb)   SpO2 100%   BMI 40.67 kg/m    white count 11 hemoglobin 10.1 platelets 223 calcium 8.4 INR 1.58  glucose 116  lactic acid went  from 2.09 to  2.30.  Vitals signs are stable at this time, but T max is 99.8. (was 11.8 on arrival )  CT of the abdomen shows Extensive fluid and soft tissue stranding/edema within the subcutaneous fat of the infraumbilical anterior and lateral abdominal wall. There are multifocal pockets of gas within the subcutaneous fat. Areas of increased density are present within thefluid and fat stranding suggestive of hematoma. Findings could besecondary to recent abdominal plasty, however the presence ofinfection cannot be ruled out. CXR NAD    Subjective: 12/23  A/O 4, positive abdominal pain but significantly decreased. Patient ambulated around the ward multiple times without dizziness. Negative CP, negative  SOB.   Assessment & Plan:   Active Problems:   Diabetes mellitus ([place])   Pulmonary embolism (HCC)   ASD (atrial septal defect)   Wound infection   Hydronephrosis, left   Hydronephrosis, right   DVT (deep venous thrombosis) (HCC)   Atrial fibrillation (HCC)   Diabetes mellitus with complication (HCC)   Wound infection after surgery, initial encounter   Abdominal hematoma   Post-operative state   Paroxysmal atrial fibrillation (HCC)   Controlled type 2 diabetes mellitus with diabetic nephropathy ([place])   Abdominal Hematoma with drainage, suspect infectious s/p abdominoplasty on 12/11 in [place] .  -Afebrile last 24 hours - Abd CT shows  Extensive fluid and soft tissue stranding/edema within the subcutaneous fat of the infraumbilical anterior and lateral abdominal wall with multifocal pockets of gas within the subcutaneous fat.   -12/22 S/P IR IR aspiration hematoma .  -Continue Vanc Zosyn. Awaiting speciation and sensitivities  -continue normal saline 125 cc/h    History of Pulmonary Embolism  -on Xarelto since 2015 currently on hold   Atrial Fibrillation (CHA2DS2-VASc score 3 , ) -Xarelto on hold .  -No EKG or Tn available for review.  -Currently in NSR   Type II Diabetes controlled with complication -Current blood sugar level is 116  Acute Blood loss Anemia  -Symptomatic Recent Labs Lab 01/27/16 0300 01/27/16 1146 01/27/16 1950 01/28/16 0654 01/28/16 1258  HGB 8.6* 8.8* 8.7* 8.3* 9.2*  -Transfuse for hemoglobin <8  -Hg q 8hr -12/21  transfuse one unit PRBC  Right Hydronephrosis  CT of the abdomen shows  -Dilated right extrarenal pelvis. Mild left hydronephrosis.  4 mm stone within the lower left kidney.  Denies dysuria or hematuria. Denies  Flank pain .  -Continue hydration -Urinalysis not consistent with infection    DVT prophylaxis: SCD  Code Status: Full  Family Communication: None  Disposition Plan: Awaiting speciation and susceptibility from  culture    Consultants:  Dr.Chelsea A Connor CCS D.O.Jaime Wagner IR    Procedures/Significant Events:  12/20 CT abdomen pelvis wo contrast:-Extensive fluid and soft tissue stranding/edema within the subcutaneous fat of the infraumbilical anterior and lateral abdominal wall. -multifocal pockets of gas within the subcutaneous fat. Areas of increased density are present within the fluid and fat stranding suggestive of hematoma.  -Punctate stone in the left kidney as before. -Dilated right extrarenal pelvis with mild left hydronephrosis, no evidence for ureteral stone. 12/21 transfuse one unit PRBC 12/22 IR CT guided aspiration of abdominal wall hematoma~10 mL  VENTILATOR SETTINGS: NA   Cultures 12/20 blood 2 pending 12/20 urine insignificant growth 12/20 MRSA by PCR negative 12/22 abdominal hematoma positive GNR    Antimicrobials: Anti-infectives    Start     Dose/Rate Route Stop   01/29/16 0200  vancomycin (VANCOCIN) 1,500 mg in sodium chloride 0.9 % 500 mL IVPB  Status:  Discontinued     1,500 mg 250 mL/hr over 120 Minutes Intravenous 01/28/16 1527   01/28/16 1600  vancomycin (VANCOCIN) 1,500 mg in sodium chloride 0.9 % 500 mL IVPB     1,500 mg 250 mL/hr over 120 Minutes Intravenous     01/26/16 0130  vancomycin (VANCOCIN) IVPB 1000 mg/200 mL premix  Status:  Discontinued     1,000 mg 200 mL/hr over 60 Minutes Intravenous 01/28/16 1507   01/25/16 2200  piperacillin-tazobactam (ZOSYN) IVPB 3.375 g     3.375 g 12.5 mL/hr over 240 Minutes Intravenous     01/25/16 1215  vancomycin (VANCOCIN) 2,500 mg in sodium chloride 0.9 % 500 mL IVPB     2,500 mg 250 mL/hr over 120 Minutes Intravenous 01/25/16 1603   01/25/16 1215  piperacillin-tazobactam (ZOSYN) IVPB 3.375 g     3.375 g 100 mL/hr over 30 Minutes Intravenous 01/25/16 1321      Devices    LINES / TUBES:      Continuous Infusions: . sodium chloride 125 mL/hr at 01/28/16 1246     Objective: Vitals:    01/28/16 0726 01/28/16 0932 01/28/16 1107 01/28/16 1508  BP: 109/71 (!) 118/59 103/60 106/63  Pulse: 68 88 66 87  Resp: 17  16 20   Temp: 99.4 F (37.4 C)  98.2 F (36.8 C) 99 F (37.2 C)  TempSrc: Oral  Oral Oral  SpO2: 99%  100% 100%  Weight:      Height:        Intake/Output Summary (Last 24 hours) at 01/28/16 1731 Last data filed at 01/28/16 1700  Gross per 24 hour  Intake          6773.01 ml  Output             2400 ml  Net          4373.01 ml   Filed Weights   01/25/16 1122 01/27/16 0310  Weight: 114.3 kg (252 lb) 119.7 kg (264 lb)    Examination:  General: Abdominal pain, No acute respiratory distress Eyes: negative scleral hemorrhage, negative anisocoria, negative icterus ENT: Negative Runny nose, negative  gingival bleeding, Neck:  Negative scars, masses, torticollis, lymphadenopathy, JVD Lungs: Clear to auscultation bilaterally without wheezes or crackles Cardiovascular: Regular rate and rhythm without murmur gallop or rub normal S1 and S2 Abdomen: Negative abdominal pain, negative distention, positive horizontal incision line w/o sanguinous discharge  Extremities: No significant cyanosis, clubbing, or edema bilateral lower extremities Psychiatric:  Negative depression, negative anxiety, negative fatigue, negative mania  Central nervous system:  Cranial nerves II through XII intact, tongue/uvula midline, all extremities muscle strength 5/5, sensation intact throughout, negative dysarthria, negative expressive aphasia, negative receptive aphasia.  .     Data Reviewed: Care during the described time interval was provided by me .  I have reviewed this patient's available data, including medical history, events of note, physical examination, and all test results as part of my evaluation. I have personally reviewed and interpreted all radiology studies.  CBC:  Recent Labs Lab 01/25/16 1127 01/26/16 0614  01/27/16 0300 01/27/16 1146 01/27/16 1950 01/28/16 0654  01/28/16 1258  WBC 11.0* 5.6  --   --   --   --   --   --   NEUTROABS 9.1*  --   --   --   --   --   --   --   HGB 10.1* 8.3*  < > 8.6* 8.8* 8.7* 8.3* 9.2*  HCT 32.0* 26.4*  --   --   --   --   --   --   MCV 83.3 84.6  --   --   --   --   --   --   PLT 223 179  --   --   --   --   --   --   < > = values in this interval not displayed. Basic Metabolic Panel:  Recent Labs Lab 01/25/16 1127 01/26/16 0614 01/28/16 1258  NA 135 139 140  K 3.6 3.7 3.5  CL 104 110 109  CO2 22 24 25   GLUCOSE 116* 88 110*  BUN 19 9 6   CREATININE 0.79 0.67 0.58  CALCIUM 8.4* 7.7* 8.2*   GFR: Estimated Creatinine Clearance: 103.5 mL/min (by C-G formula based on SCr of 0.58 mg/dL). Liver Function Tests:  Recent Labs Lab 01/25/16 1127 01/26/16 0614  AST 17 12*  ALT 15 11*  ALKPHOS 59 49  BILITOT 1.7* 1.4*  PROT 5.7* 5.1*  ALBUMIN 2.9* 2.3*   No results for input(s): LIPASE, AMYLASE in the last 168 hours. No results for input(s): AMMONIA in the last 168 hours. Coagulation Profile:  Recent Labs Lab 01/25/16 1127 01/26/16 0614  INR 1.58 1.36   Cardiac Enzymes:  Recent Labs Lab 01/25/16 1912 01/25/16 2217  TROPONINI <0.03 <0.03   BNP (last 3 results) No results for input(s): PROBNP in the last 8760 hours. HbA1C:  Recent Labs  01/25/16 1911  HGBA1C <4.2*   CBG:  Recent Labs Lab 01/27/16 1617 01/27/16 2139 01/28/16 0754 01/28/16 1132 01/28/16 1559  GLUCAP 104* 112* 74 82 89   Lipid Profile: No results for input(s): CHOL, HDL, LDLCALC, TRIG, CHOLHDL, LDLDIRECT in the last 72 hours. Thyroid Function Tests: No results for input(s): TSH, T4TOTAL, FREET4, T3FREE, THYROIDAB in the last 72 hours. Anemia Panel: No results for input(s): VITAMINB12, FOLATE, FERRITIN, TIBC, IRON, RETICCTPCT in the last 72 hours. Urine analysis:    Component Value Date/Time   COLORURINE YELLOW 01/25/2016 [place] 01/25/2016 1447   LABSPEC 1.014 01/25/2016 1447   PHURINE 5.0  01/25/2016 1447  GLUCOSEU NEGATIVE 01/25/2016 1447   GLUCOSEU NEGATIVE 09/04/2013 1605   HGBUR NEGATIVE 01/25/2016 1447   BILIRUBINUR NEGATIVE 01/25/2016 1447   KETONESUR NEGATIVE 01/25/2016 1447   PROTEINUR NEGATIVE 01/25/2016 1447   UROBILINOGEN 1.0 09/04/2013 1605   NITRITE NEGATIVE 01/25/2016 1447   LEUKOCYTESUR NEGATIVE 01/25/2016 1447   Sepsis Labs: @LABRCNTIP (procalcitonin:4,lacticidven:4)  ) Recent Results (from the past 240 hour(s))  Blood Culture (routine x 2)     Status: None (Preliminary result)   Collection Time: 01/25/16 11:20 AM  Result Value Ref Range Status   Specimen Description BLOOD RIGHT HAND  Final   Special Requests BOTTLES DRAWN AEROBIC AND ANAEROBIC  5CC  Final   Culture NO GROWTH 3 DAYS  Final   Report Status PENDING  Incomplete  Blood Culture (routine x 2)     Status: None (Preliminary result)   Collection Time: 01/25/16 12:37 PM  Result Value Ref Range Status   Specimen Description BLOOD RIGHT ANTECUBITAL  Final   Special Requests BOTTLES DRAWN AEROBIC AND ANAEROBIC  5CC  Final   Culture NO GROWTH 3 DAYS  Final   Report Status PENDING  Incomplete  Urine culture     Status: Abnormal   Collection Time: 01/25/16  2:47 PM  Result Value Ref Range Status   Specimen Description URINE, RANDOM  Final   Special Requests NONE  Final   Culture <10,000 COLONIES/mL INSIGNIFICANT GROWTH (A)  Final   Report Status 01/26/2016 FINAL  Final  MRSA PCR Screening     Status: None   Collection Time: 01/25/16  5:49 PM  Result Value Ref Range Status   MRSA by PCR NEGATIVE NEGATIVE Final    Comment:        The GeneXpert MRSA Assay (FDA approved for NASAL specimens only), is one component of a comprehensive MRSA colonization surveillance program. It is not intended to diagnose MRSA infection nor to guide or monitor treatment for MRSA infections.   Aerobic/Anaerobic Culture (surgical/deep wound)     Status: None (Preliminary result)   Collection Time: 01/27/16  12:41 PM  Result Value Ref Range Status   Specimen Description ABDOMEN  Final   Special Requests NONE  Final   Gram Stain   Final    ABUNDANT WBC PRESENT, PREDOMINANTLY PMN FEW GRAM NEGATIVE RODS RARE GRAM POSITIVE COCCI IN PAIRS RARE GRAM POSITIVE COCCOBACILLUS    Culture ABUNDANT GRAM NEGATIVE RODS  Final   Report Status PENDING  Incomplete         Radiology Studies: Ct Aspiration  Result Date: 01/27/2016 INDICATION: 55 year old female with a history of tummy tuck, with abdominal wall hematoma and possible infection. EXAM: CT GUIDANCE NEEDLE PLACEMENT MEDICATIONS: The patient is currently admitted to the hospital and receiving intravenous antibiotics. The antibiotics were administered within an appropriate time frame prior to the initiation of the procedure. ANESTHESIA/SEDATION: Fentanyl 25 mcg IV; Versed 1.0 mg IV Moderate Sedation Time:  10 The patient was continuously monitored during the procedure by the interventional radiology nurse under my direct supervision. COMPLICATIONS: None PROCEDURE: Informed written consent was obtained from the patient after a thorough discussion of the procedural risks, benefits and alternatives. All questions were addressed. Maximal Sterile Barrier Technique was utilized including caps, mask, sterile gowns, sterile gloves, sterile drape, hand hygiene and skin antiseptic. A timeout was performed prior to the initiation of the procedure. Patient is positioned supine position on CT gantry table. Scout CT was performed for planning purposes. The skin and subcutaneous tissues overlying the right lower abdomen  were prepped and draped in the usual sterile fashion. The skin and subcutaneous tissues were generously infiltrated 1% lidocaine for local anesthesia. Using CT guidance, Yueh needle was advanced into the fluid collection and an aspiration was achieved of approximately 10 cc of dark serosanguineous fluid. Sample sent the lab for analysis. Catheter was removed  and a sterile bandage was placed. Patient tolerated the procedure well and remained hemodynamically stable throughout. No complications were encountered and no significant blood loss. IMPRESSION: Status post CT-guided aspiration of abdominal wall hematoma. Sample was sent to the lab for culture. Signed, Dulcy Fanny. Earleen Newport, DO [facility] Radiology Electronically Signed   By: Corrie Mckusick D.O.   On: 01/27/2016 11:51        Scheduled Meds: . sodium chloride   Intravenous Once  . escitalopram  10 mg Oral Daily  . Influenza vac split quadrivalent PF  0.5 mL Intramuscular Tomorrow-1000  . insulin aspart  0-9 Units Subcutaneous TID WC  . metoprolol tartrate  12.5 mg Oral BID  . piperacillin-tazobactam (ZOSYN)  IV  3.375 g Intravenous Q8H  . sodium chloride flush  3 mL Intravenous Q12H  . vancomycin  1,500 mg Intravenous Q12H   Continuous Infusions: . sodium chloride 125 mL/hr at 01/28/16 1246     LOS: 3 days    Time spent: 40 minutes    Jazen Spraggins, Geraldo Docker, MD [facility] Pager [phone]   If 7PM-7AM, please contact night-coverage [email or web address] Password [facility] 01/28/2016, 5:31 PM

## 2016-01-28 NOTE — Progress Notes (Signed)
Subjective: Feels better daily. Mild pain.  Objective: Vital signs in last 24 hours: Temp:  [98.1 F (36.7 C)-99.4 F (37.4 C)] 99.4 F (37.4 C) (12/23 0726) Pulse Rate:  [68-88] 88 (12/23 0932) Resp:  [15-24] 17 (12/23 0726) BP: (103-126)/(59-71) 118/59 (12/23 0932) SpO2:  [99 %-100 %] 99 % (12/23 0726) Last BM Date: 01/27/16  Intake/Output from previous day: 12/22 0701 - 12/23 0700 In: 3938 [P.O.:760; I.V.:2628; IV Piggyback:550] Out: [id number] [Urine:1625] Intake/Output this shift: Total I/O In: 669.2 [P.O.:240; I.V.:429.2] Out: 750 [Urine:750]  General appearance: alert, cooperative and no distress Incision/Wound: Dressing changed. Moderate amount of old bloody drainage which is coming through a single pinpoint area of her panniculectomy incision. Incision appears clean without erythema or necrosis. There is extensive ecchymosis over the lower abdominal wall and thighs.  Lab Results:   Recent Labs  01/25/16 1127 01/26/16 0614  01/27/16 1950 01/28/16 0654  WBC 11.0* 5.6  --   --   --   HGB 10.1* 8.3*  < > 8.7* 8.3*  HCT 32.0* 26.4*  --   --   --   PLT 223 179  --   --   --   < > = values in this interval not displayed. BMET  Recent Labs  01/25/16 1127 01/26/16 0614  NA 135 139  K 3.6 3.7  CL 104 110  CO2 22 24  GLUCOSE 116* 88  BUN 19 9  CREATININE 0.79 0.67  CALCIUM 8.4* 7.7*     Studies/Results: Ct Aspiration  Result Date: 01/27/2016 INDICATION: 55 year old female with a history of tummy tuck, with abdominal wall hematoma and possible infection. EXAM: CT GUIDANCE NEEDLE PLACEMENT MEDICATIONS: The patient is currently admitted to the hospital and receiving intravenous antibiotics. The antibiotics were administered within an appropriate time frame prior to the initiation of the procedure. ANESTHESIA/SEDATION: Fentanyl 25 mcg IV; Versed 1.0 mg IV Moderate Sedation Time:  10 The patient was continuously monitored during the procedure by the  interventional radiology nurse under my direct supervision. COMPLICATIONS: None PROCEDURE: Informed written consent was obtained from the patient after a thorough discussion of the procedural risks, benefits and alternatives. All questions were addressed. Maximal Sterile Barrier Technique was utilized including caps, mask, sterile gowns, sterile gloves, sterile drape, hand hygiene and skin antiseptic. A timeout was performed prior to the initiation of the procedure. Patient is positioned supine position on CT gantry table. Scout CT was performed for planning purposes. The skin and subcutaneous tissues overlying the right lower abdomen were prepped and draped in the usual sterile fashion. The skin and subcutaneous tissues were generously infiltrated 1% lidocaine for local anesthesia. Using CT guidance, Yueh needle was advanced into the fluid collection and an aspiration was achieved of approximately 10 cc of dark serosanguineous fluid. Sample sent the lab for analysis. Catheter was removed and a sterile bandage was placed. Patient tolerated the procedure well and remained hemodynamically stable throughout. No complications were encountered and no significant blood loss. IMPRESSION: Status post CT-guided aspiration of abdominal wall hematoma. Sample was sent to the lab for culture. Signed, Dulcy Fanny. Earleen Newport, DO [facility] Electronically Signed   By: Corrie Mckusick D.O.   On: 01/27/2016 11:51   Results for orders placed or performed during the hospital encounter of 01/25/16  Blood Culture (routine x 2)     Status: None (Preliminary result)   Collection Time: 01/25/16 11:20 AM  Result Value Ref Range Status   Specimen Description BLOOD  RIGHT HAND  Final   Special Requests BOTTLES DRAWN AEROBIC AND ANAEROBIC  5CC  Final   Culture NO GROWTH 2 DAYS  Final   Report Status PENDING  Incomplete  Blood Culture (routine x 2)     Status: None (Preliminary result)    Collection Time: 01/25/16 12:37 PM  Result Value Ref Range Status   Specimen Description BLOOD RIGHT ANTECUBITAL  Final   Special Requests BOTTLES DRAWN AEROBIC AND ANAEROBIC  5CC  Final   Culture NO GROWTH 2 DAYS  Final   Report Status PENDING  Incomplete  Urine culture     Status: Abnormal   Collection Time: 01/25/16  2:47 PM  Result Value Ref Range Status   Specimen Description URINE, RANDOM  Final   Special Requests NONE  Final   Culture <10,000 COLONIES/mL INSIGNIFICANT GROWTH (A)  Final   Report Status 01/26/2016 FINAL  Final  MRSA PCR Screening     Status: None   Collection Time: 01/25/16  5:49 PM  Result Value Ref Range Status   MRSA by PCR NEGATIVE NEGATIVE Final    Comment:        The GeneXpert MRSA Assay (FDA approved for NASAL specimens only), is one component of a comprehensive MRSA colonization surveillance program. It is not intended to diagnose MRSA infection nor to guide or monitor treatment for MRSA infections.   Aerobic/Anaerobic Culture (surgical/deep wound)     Status: None (Preliminary result)   Collection Time: 01/27/16 12:41 PM  Result Value Ref Range Status   Specimen Description ABDOMEN  Final   Special Requests NONE  Final   Gram Stain   Final    ABUNDANT WBC PRESENT, PREDOMINANTLY PMN FEW GRAM NEGATIVE RODS RARE GRAM POSITIVE COCCI IN PAIRS RARE GRAM POSITIVE COCCOBACILLUS    Culture PENDING  Incomplete   Report Status PENDING  Incomplete     Anti-infectives: Anti-infectives    Start     Dose/Rate Route Frequency Ordered Stop   01/26/16 0130  vancomycin (VANCOCIN) IVPB 1000 mg/200 mL premix     1,000 mg 200 mL/hr over 60 Minutes Intravenous Every 12 hours 01/25/16 1318     01/25/16 2200  piperacillin-tazobactam (ZOSYN) IVPB 3.375 g     3.375 g 12.5 mL/hr over 240 Minutes Intravenous Every 8 hours 01/25/16 1318     01/25/16 1215  vancomycin (VANCOCIN) 2,500 mg in sodium chloride 0.9 % 500 mL IVPB     2,500 mg 250 mL/hr over 120  Minutes Intravenous  Once 01/25/16 1203 01/25/16 1603   01/25/16 1215  piperacillin-tazobactam (ZOSYN) IVPB 3.375 g     3.375 g 100 mL/hr over 30 Minutes Intravenous  Once 01/25/16 1203 01/25/16 1321      Assessment/Plan: Infected abdominal wall hematoma status post panniculectomy in [place]. He has spontaneous drainage of old blood through the incision. Gram stain showing multiple organisms. On appropriate broad-spectrum antibiotics and appears to be improving. Hemoglobin has drifted down but blood looks old and I doubt significant active bleeding. Continue current therapy.    LOS: 3 days    [name] 12/23/2017Patient ID: [name], female   DOB: Mar 15, 1960, 55 y.o.   MRN: [id number]

## 2016-01-28 NOTE — Progress Notes (Signed)
Pharmacy Antibiotic Note  [name] is a 55 y.o. female admitted on 01/25/2016 with wound infection s/p abdominoplasty in [place] on 12/11. CT-guided aspiration of abd wall hematoma in IR 12/22 and fluid sent to lab. Afebrile and WBC down to 5.6. Vancomycin trough drawn appropriately and is subtherapeutic at 9 mcg/mL. SCr 0.58, estimated CrCl ~ 100 mL/min.   Plan: Increase vancomycin to 1500mg  IV q12h  Goal trough 15-20 mcg/mL Continue zosyn 3.375g IV q8h Monitor clinical status, length of therapy, c/s, and renal function  Check VT as indicated  Height: 5\' 6"  (167.6 cm) Weight: 264 lb (119.7 kg) IBW/kg (Calculated) : 59.3  Temp (24hrs), Avg:98.6 F (37 C), Min:98.1 F (36.7 C), Max:99.4 F (37.4 C)  CBC    Component Value Date/Time   WBC 5.6 01/26/2016 0614   RBC 3.12 (L) 01/26/2016 0614   HGB 8.3 (L) 01/28/2016 0654   HCT 26.4 (L) 01/26/2016 0614   PLT 179 01/26/2016 0614   MCV 84.6 01/26/2016 0614   MCH 26.6 01/26/2016 0614   MCHC 31.4 01/26/2016 0614   RDW 18.2 (H) 01/26/2016 0614   LYMPHSABS 1.2 01/25/2016 1127   MONOABS 0.7 01/25/2016 1127   EOSABS 0.0 01/25/2016 1127   BASOSABS 0.0 01/25/2016 1127    BMET    Component Value Date/Time   NA 139 01/26/2016 0614   K 3.7 01/26/2016 0614   CL 110 01/26/2016 0614   CO2 24 01/26/2016 0614   GLUCOSE 88 01/26/2016 0614   BUN 9 01/26/2016 0614   CREATININE 0.67 01/26/2016 0614   CREATININE 0.63 08/24/2010 1107   CALCIUM 7.7 (L) 01/26/2016 0614   GFRNONAA >60 01/26/2016 0614   GFRAA >60 01/26/2016 [id number]    Allergies  Allergen Reactions  . Ancef [Cefazolin Sodium] Nausea And Vomiting  . Contrast Media [Iodinated Diagnostic Agents]     apnea  . Penicillins Other (See Comments)    Itchy rash as a kid, it happened when she was a child Has patient had a PCN reaction causing immediate rash, facial/tongue/throat swelling, SOB or lightheadedness with hypotension: YES Has patient had a PCN reaction causing severe  rash involving mucus membranes or skin necrosis: NO Has patient had a PCN reaction that required hospitalization NO Has patient had a PCN reaction occurring within the last 10 years: NO If all of the above answers are "NO", then may proceed with Cephalosporin use.    Antimicrobials this admission: Vancomycin 12/20 >>  Zosyn 12/20 >>   Dose adjustments this admission:  Microbiology results: 12/20blood cx x 2 - ngtd 12/20 urine cx: < 10K/ml, insignificant growth 12/20 MRSA screen negative 12/22 Abscess from abdomen - pending   Argie Ramming, PharmD Pharmacy Resident  Pager [phone] 01/28/16 12:32 PM

## 2016-01-29 DIAGNOSIS — T148XXA Other injury of unspecified body region, initial encounter: Secondary | ICD-10-CM

## 2016-01-29 DIAGNOSIS — L089 Local infection of the skin and subcutaneous tissue, unspecified: Secondary | ICD-10-CM

## 2016-01-29 LAB — BASIC METABOLIC PANEL
ANION GAP: 5 (ref 5–15)
BUN: 8 mg/dL (ref 6–20)
CALCIUM: 8.2 mg/dL — AB (ref 8.9–10.3)
CO2: 24 mmol/L (ref 22–32)
Chloride: 110 mmol/L (ref 101–111)
Creatinine, Ser: 0.59 mg/dL (ref 0.44–1.00)
Glucose, Bld: 106 mg/dL — ABNORMAL HIGH (ref 65–99)
POTASSIUM: 3.8 mmol/L (ref 3.5–5.1)
Sodium: 139 mmol/L (ref 135–145)

## 2016-01-29 LAB — HEMOGLOBIN: HEMOGLOBIN: 8.4 g/dL — AB (ref 12.0–15.0)

## 2016-01-29 MED ORDER — SUMATRIPTAN SUCCINATE 6 MG/0.5ML ~~LOC~~ SOLN
6.0000 mg | Freq: Two times a day (BID) | SUBCUTANEOUS | Status: DC | PRN
  Filled 2016-01-29: qty 0.5

## 2016-01-29 MED ORDER — ALUM & MAG HYDROXIDE-SIMETH 200-200-20 MG/5ML PO SUSP
30.0000 mL | ORAL | Status: DC | PRN
  Administered 2016-01-29 (×2): 30 mL via ORAL
  Filled 2016-01-29 (×2): qty 30

## 2016-01-29 NOTE — Progress Notes (Signed)
Subjective: Decreased drainage.  Pain improved.    Objective: Vital signs in last 24 hours: Temp:  [98.2 F (36.8 C)-99.1 F (37.3 C)] 99.1 F (37.3 C) (12/24 0359) Pulse Rate:  [63-87] 63 (12/24 0707) Resp:  [16-22] 17 (12/24 0707) BP: (103-123)/(56-75) 117/75 (12/24 0707) SpO2:  [97 %-100 %] 100 % (12/24 0707) Last BM Date: 01/28/16  Intake/Output from previous day: 12/23 0701 - 12/24 0700 In: 6073 [P.O.:1680; I.V.:3243; IV Piggyback:1150] Out: 4400 [Urine:4400] Intake/Output this shift: Total I/O In: 629.6 [P.O.:240; I.V.:389.6] Out: -   General appearance: alert, cooperative and no distress Incision/Wound: Dressing changed.continues to have old bloody drainage consistent with liquifying hematoma.  Coming from right mid lateral aspect from 2 sites.    There is extensive ecchymosis over the lower abdominal wall and thighs.  Lab Results:   Recent Labs  01/28/16 1820 01/29/16 0344  HGB 8.8* 8.4*   BMET  Recent Labs  01/28/16 1258  NA 140  K 3.5  CL 109  CO2 25  GLUCOSE 110*  BUN 6  CREATININE 0.58  CALCIUM 8.2*     Studies/Results: Ct Aspiration  Result Date: 01/27/2016 INDICATION: 55 year old female with a history of tummy tuck, with abdominal wall hematoma and possible infection. EXAM: CT GUIDANCE NEEDLE PLACEMENT MEDICATIONS: The patient is currently admitted to the hospital and receiving intravenous antibiotics. The antibiotics were administered within an appropriate time frame prior to the initiation of the procedure. ANESTHESIA/SEDATION: Fentanyl 25 mcg IV; Versed 1.0 mg IV Moderate Sedation Time:  10 The patient was continuously monitored during the procedure by the interventional radiology nurse under my direct supervision. COMPLICATIONS: None PROCEDURE: Informed written consent was obtained from the patient after a thorough discussion of the procedural risks, benefits and alternatives. All questions were addressed. Maximal Sterile Barrier  Technique was utilized including caps, mask, sterile gowns, sterile gloves, sterile drape, hand hygiene and skin antiseptic. A timeout was performed prior to the initiation of the procedure. Patient is positioned supine position on CT gantry table. Scout CT was performed for planning purposes. The skin and subcutaneous tissues overlying the right lower abdomen were prepped and draped in the usual sterile fashion. The skin and subcutaneous tissues were generously infiltrated 1% lidocaine for local anesthesia. Using CT guidance, Yueh needle was advanced into the fluid collection and an aspiration was achieved of approximately 10 cc of dark serosanguineous fluid. Sample sent the lab for analysis. Catheter was removed and a sterile bandage was placed. Patient tolerated the procedure well and remained hemodynamically stable throughout. No complications were encountered and no significant blood loss. IMPRESSION: Status post CT-guided aspiration of abdominal wall hematoma. Sample was sent to the lab for culture. Signed, Dulcy Fanny. Earleen Newport, DO [facility] Radiology Electronically Signed   By: Corrie Mckusick D.O.   On: 01/27/2016 11:51   Results for orders placed or performed during the hospital encounter of 01/25/16  Blood Culture (routine x 2)     Status: None (Preliminary result)   Collection Time: 01/25/16 11:20 AM  Result Value Ref Range Status   Specimen Description BLOOD RIGHT HAND  Final   Special Requests BOTTLES DRAWN AEROBIC AND ANAEROBIC  5CC  Final   Culture NO GROWTH 4 DAYS  Final   Report Status PENDING  Incomplete  Blood Culture (routine x 2)     Status: None (Preliminary result)   Collection Time: 01/25/16 12:37 PM  Result Value Ref Range Status   Specimen Description BLOOD RIGHT ANTECUBITAL  Final   Special Requests BOTTLES DRAWN AEROBIC AND ANAEROBIC  5CC  Final   Culture NO GROWTH 4 DAYS  Final   Report Status PENDING  Incomplete  Urine culture      Status: Abnormal   Collection Time: 01/25/16  2:47 PM  Result Value Ref Range Status   Specimen Description URINE, RANDOM  Final   Special Requests NONE  Final   Culture <10,000 COLONIES/mL INSIGNIFICANT GROWTH (A)  Final   Report Status 01/26/2016 FINAL  Final  MRSA PCR Screening     Status: None   Collection Time: 01/25/16  5:49 PM  Result Value Ref Range Status   MRSA by PCR NEGATIVE NEGATIVE Final    Comment:        The GeneXpert MRSA Assay (FDA approved for NASAL specimens only), is one component of a comprehensive MRSA colonization surveillance program. It is not intended to diagnose MRSA infection nor to guide or monitor treatment for MRSA infections.   Aerobic/Anaerobic Culture (surgical/deep wound)     Status: None (Preliminary result)   Collection Time: 01/27/16 12:41 PM  Result Value Ref Range Status   Specimen Description ABDOMEN  Final   Special Requests NONE  Final   Gram Stain   Final    ABUNDANT WBC PRESENT, PREDOMINANTLY PMN FEW GRAM NEGATIVE RODS RARE GRAM POSITIVE COCCI IN PAIRS RARE GRAM POSITIVE COCCOBACILLUS    Culture ABUNDANT GRAM NEGATIVE RODS  Final   Report Status PENDING  Incomplete     Anti-infectives: Anti-infectives    Start     Dose/Rate Route Frequency Ordered Stop   01/29/16 0200  vancomycin (VANCOCIN) 1,500 mg in sodium chloride 0.9 % 500 mL IVPB  Status:  Discontinued     1,500 mg 250 mL/hr over 120 Minutes Intravenous Every 12 hours 01/28/16 1507 01/28/16 1527   01/28/16 1600  vancomycin (VANCOCIN) 1,500 mg in sodium chloride 0.9 % 500 mL IVPB     1,500 mg 250 mL/hr over 120 Minutes Intravenous Every 12 hours 01/28/16 1527     01/26/16 0130  vancomycin (VANCOCIN) IVPB 1000 mg/200 mL premix  Status:  Discontinued     1,000 mg 200 mL/hr over 60 Minutes Intravenous Every 12 hours 01/25/16 1318 01/28/16 1507   01/25/16 2200  piperacillin-tazobactam (ZOSYN) IVPB 3.375 g     3.375 g 12.5 mL/hr over 240 Minutes Intravenous Every 8  hours 01/25/16 1318     01/25/16 1215  vancomycin (VANCOCIN) 2,500 mg in sodium chloride 0.9 % 500 mL IVPB     2,500 mg 250 mL/hr over 120 Minutes Intravenous  Once 01/25/16 1203 01/25/16 1603   01/25/16 1215  piperacillin-tazobactam (ZOSYN) IVPB 3.375 g     3.375 g 100 mL/hr over 30 Minutes Intravenous  Once 01/25/16 1203 01/25/16 1321      Assessment/Plan: Infected abdominal wall hematoma status post panniculectomy in [place]. She has spontaneous drainage of old blood through the incision. Gram stain showing multiple organisms with abundant GNR.   Continue broad spectrum antibiotics until we get better speciation. Dry dressing as needed. Mesh underwear may help patient avoid tape trauma to skin.  Diet as tolerated.    LOS: 4 days    [facility] 01/29/2016

## 2016-01-29 NOTE — Progress Notes (Signed)
PROGRESS NOTE    [name]  [id number] DOB: August 21, 1960 DOA: 01/25/2016 PCP: [facility]   Brief Narrative:  55 y.o. WF PMHx  A-fib, PE/DVT on Xarelto, DM2 controlled with complication, PVD, HLD,Bleeding ulcer, Anemia  Presenting via EMS with c/p post op complications after abdominoplasty done in [place] in 12/11. She had been discharged on 12/12 with Cipro but her status as complicated by abdominal hematoma. SHe reported returning to the hospital while there for hematoma drainage.She continued taking Cipro on her own and flew back today to the [place]. She reports odorous, dark blood drainage in the RLQ. She was dizzy and dehydrated, feeling weak  . Denies any fever, chills, night sweats, nausea or vomiting. No chest pain or shortness of breath . Denies dysuria, hematuria or trouble with bowel movements. Of note, she did report right flank discomfort during her flight . She denies any lower extremity swelling.     ED Course:  BP 124/57   Pulse 85   Temp 99.8 F (37.7 C) (Oral)   Resp (!) 27   Ht 5\' 6"  (1.676 m)   Wt 114.3 kg (252 lb)   SpO2 100%   BMI 40.67 kg/m    white count 11 hemoglobin 10.1 platelets 223 calcium 8.4 INR 1.58  glucose 116  lactic acid went  from 2.09 to  2.30.  Vitals signs are stable at this time, but T max is 99.8. (was 11.8 on arrival )  CT of the abdomen shows Extensive fluid and soft tissue stranding/edema within the subcutaneous fat of the infraumbilical anterior and lateral abdominal wall. There are multifocal pockets of gas within the subcutaneous fat. Areas of increased density are present within thefluid and fat stranding suggestive of hematoma. Findings could besecondary to recent abdominal plasty, however the presence ofinfection cannot be ruled out. CXR NAD    Subjective: 12/24  A/O 4, negative abdominal pain but significantly decreased. Patient ambulated around the ward multiple times without dizziness. Negative CP, negative  SOB.   Assessment & Plan:   Active Problems:   Diabetes mellitus ([place])   Pulmonary embolism (HCC)   ASD (atrial septal defect)   Wound infection   Hydronephrosis, left   Hydronephrosis, right   DVT (deep venous thrombosis) (HCC)   Atrial fibrillation (HCC)   Diabetes mellitus with complication (HCC)   Wound infection after surgery, initial encounter   Abdominal hematoma   Post-operative state   Paroxysmal atrial fibrillation (HCC)   Controlled type 2 diabetes mellitus with diabetic nephropathy ([place])   Abdominal Hematoma with drainage, suspect infectious s/p abdominoplasty on 12/11 in [place] .  -Afebrile last 24 hours - Abd CT shows  Extensive fluid and soft tissue stranding/edema within the subcutaneous fat of the infraumbilical anterior and lateral abdominal wall with multifocal pockets of gas within the subcutaneous fat.   -12/22 S/P IR IR aspiration hematoma .  -Continue Vanc Zosyn. Per surgery recommendations   Pulmonary Embolism  -on Xarelto since 2015 currently on hold   Atrial Fibrillation (CHA2DS2-VASc score 3 , ) -Xarelto on hold .  -No EKG or Tn available for review.  -Currently in NSR   Type II Diabetes controlled with complication -Current blood sugar level is 116  Acute Blood loss Anemia  -Symptomatic  Recent Labs Lab 01/27/16 1950 01/28/16 0654 01/28/16 1258 01/28/16 1820 01/29/16 0344  HGB 8.7* 8.3* 9.2* 8.8* 8.4*  -Transfuse for hemoglobin <8  -Hg q 8hr -12/21 transfuse one unit PRBC  Right Hydronephrosis  CT  of the abdomen shows  -Dilated right extrarenal pelvis. Mild left hydronephrosis.  4 mm stone within the lower left kidney.  Denies dysuria or hematuria. Denies  Flank pain .  -Continue hydration -Urinalysis not consistent with infection    DVT prophylaxis: SCD  Code Status: Full  Family Communication: None  Disposition Plan: Awaiting speciation and susceptibility from culture    Consultants:  Dr.Chelsea A Connor  CCS D.O.Jaime Wagner IR    Procedures/Significant Events:  12/20 CT abdomen pelvis wo contrast:-Extensive fluid and soft tissue stranding/edema within the subcutaneous fat of the infraumbilical anterior and lateral abdominal wall. -multifocal pockets of gas within the subcutaneous fat. Areas of increased density are present within the fluid and fat stranding suggestive of hematoma.  -Punctate stone in the left kidney as before. -Dilated right extrarenal pelvis with mild left hydronephrosis, no evidence for ureteral stone. 12/21 transfuse one unit PRBC 12/22 IR CT guided aspiration of abdominal wall hematoma~10 mL  VENTILATOR SETTINGS: NA   Cultures 12/20 blood 2 pending 12/20 urine insignificant growth 12/20 MRSA by PCR negative 12/22 abdominal hematoma positive ESCHERICHIA COLI     Antimicrobials: Anti-infectives    Start     Dose/Rate Route Stop   01/29/16 0200  vancomycin (VANCOCIN) 1,500 mg in sodium chloride 0.9 % 500 mL IVPB  Status:  Discontinued     1,500 mg 250 mL/hr over 120 Minutes Intravenous 01/28/16 1527   01/28/16 1600  vancomycin (VANCOCIN) 1,500 mg in sodium chloride 0.9 % 500 mL IVPB     1,500 mg 250 mL/hr over 120 Minutes Intravenous     01/26/16 0130  vancomycin (VANCOCIN) IVPB 1000 mg/200 mL premix  Status:  Discontinued     1,000 mg 200 mL/hr over 60 Minutes Intravenous 01/28/16 1507   01/25/16 2200  piperacillin-tazobactam (ZOSYN) IVPB 3.375 g     3.375 g 12.5 mL/hr over 240 Minutes Intravenous     01/25/16 1215  vancomycin (VANCOCIN) 2,500 mg in sodium chloride 0.9 % 500 mL IVPB     2,500 mg 250 mL/hr over 120 Minutes Intravenous 01/25/16 1603   01/25/16 1215  piperacillin-tazobactam (ZOSYN) IVPB 3.375 g     3.375 g 100 mL/hr over 30 Minutes Intravenous 01/25/16 1321      Devices    LINES / TUBES:      Continuous Infusions: . sodium chloride 125 mL/hr at 01/28/16 2343     Objective: Vitals:   01/28/16 2137 01/28/16 2351  01/29/16 0359 01/29/16 0707  BP: 113/65 123/68 115/64 117/75  Pulse: 74 73 71 63  Resp:  (!) 22 17 17   Temp:  98.3 F (36.8 C) 99.1 F (37.3 C)   TempSrc:  Oral Oral   SpO2:  97% 99% 100%  Weight:      Height:        Intake/Output Summary (Last 24 hours) at 01/29/16 0815 Last data filed at 01/29/16 [id number]  Gross per 24 hour  Intake          6033.42 ml  Output             3650 ml  Net          2383.42 ml   Filed Weights   01/25/16 1122 01/27/16 0310  Weight: 114.3 kg (252 lb) 119.7 kg (264 lb)    Examination:  General: Abdominal pain, No acute respiratory distress Eyes: negative scleral hemorrhage, negative anisocoria, negative icterus ENT: Negative Runny nose, negative gingival bleeding, Neck:  Negative scars, masses, torticollis, lymphadenopathy, JVD  Lungs: Clear to auscultation bilaterally without wheezes or crackles Cardiovascular: Regular rate and rhythm without murmur gallop or rub normal S1 and S2 Abdomen: Negative abdominal pain, negative distention, positive horizontal incision line w/o sanguinous discharge  Extremities: No significant cyanosis, clubbing, or edema bilateral lower extremities Psychiatric:  Negative depression, negative anxiety, negative fatigue, negative mania  Central nervous system:  Cranial nerves II through XII intact, tongue/uvula midline, all extremities muscle strength 5/5, sensation intact throughout, negative dysarthria, negative expressive aphasia, negative receptive aphasia.  .     Data Reviewed: Care during the described time interval was provided by me .  I have reviewed this patient's available data, including medical history, events of note, physical examination, and all test results as part of my evaluation. I have personally reviewed and interpreted all radiology studies.  CBC:  Recent Labs Lab 01/25/16 1127 01/26/16 0614  01/27/16 1950 01/28/16 0654 01/28/16 1258 01/28/16 1820 01/29/16 0344  WBC 11.0* 5.6  --   --   --    --   --   --   NEUTROABS 9.1*  --   --   --   --   --   --   --   HGB 10.1* 8.3*  < > 8.7* 8.3* 9.2* 8.8* 8.4*  HCT 32.0* 26.4*  --   --   --   --   --   --   MCV 83.3 84.6  --   --   --   --   --   --   PLT 223 179  --   --   --   --   --   --   < > = values in this interval not displayed. Basic Metabolic Panel:  Recent Labs Lab 01/25/16 1127 01/26/16 0614 01/28/16 1258  NA 135 139 140  K 3.6 3.7 3.5  CL 104 110 109  CO2 22 24 25   GLUCOSE 116* 88 110*  BUN 19 9 6   CREATININE 0.79 0.67 0.58  CALCIUM 8.4* 7.7* 8.2*   GFR: Estimated Creatinine Clearance: 103.5 mL/min (by C-G formula based on SCr of 0.58 mg/dL). Liver Function Tests:  Recent Labs Lab 01/25/16 1127 01/26/16 0614  AST 17 12*  ALT 15 11*  ALKPHOS 59 49  BILITOT 1.7* 1.4*  PROT 5.7* 5.1*  ALBUMIN 2.9* 2.3*   No results for input(s): LIPASE, AMYLASE in the last 168 hours. No results for input(s): AMMONIA in the last 168 hours. Coagulation Profile:  Recent Labs Lab 01/25/16 1127 01/26/16 0614  INR 1.58 1.36   Cardiac Enzymes:  Recent Labs Lab 01/25/16 1912 01/25/16 2217  TROPONINI <0.03 <0.03   BNP (last 3 results) No results for input(s): PROBNP in the last 8760 hours. HbA1C: No results for input(s): HGBA1C in the last 72 hours. CBG:  Recent Labs Lab 01/27/16 1617 01/27/16 2139 01/28/16 0754 01/28/16 1132 01/28/16 1559  GLUCAP 104* 112* 74 82 89   Lipid Profile: No results for input(s): CHOL, HDL, LDLCALC, TRIG, CHOLHDL, LDLDIRECT in the last 72 hours. Thyroid Function Tests: No results for input(s): TSH, T4TOTAL, FREET4, T3FREE, THYROIDAB in the last 72 hours. Anemia Panel: No results for input(s): VITAMINB12, FOLATE, FERRITIN, TIBC, IRON, RETICCTPCT in the last 72 hours. Urine analysis:    Component Value Date/Time   COLORURINE YELLOW 01/25/2016 [place] 01/25/2016 1447   LABSPEC 1.014 01/25/2016 1447   PHURINE 5.0 01/25/2016 1447   GLUCOSEU NEGATIVE  01/25/2016 1447   GLUCOSEU NEGATIVE 09/04/2013 1605  HGBUR NEGATIVE 01/25/2016 [place] 01/25/2016 1447   KETONESUR NEGATIVE 01/25/2016 1447   PROTEINUR NEGATIVE 01/25/2016 1447   UROBILINOGEN 1.0 09/04/2013 1605   NITRITE NEGATIVE 01/25/2016 1447   LEUKOCYTESUR NEGATIVE 01/25/2016 1447   Sepsis Labs: @LABRCNTIP (procalcitonin:4,lacticidven:4)  ) Recent Results (from the past 240 hour(s))  Blood Culture (routine x 2)     Status: None (Preliminary result)   Collection Time: 01/25/16 11:20 AM  Result Value Ref Range Status   Specimen Description BLOOD RIGHT HAND  Final   Special Requests BOTTLES DRAWN AEROBIC AND ANAEROBIC  5CC  Final   Culture NO GROWTH 3 DAYS  Final   Report Status PENDING  Incomplete  Blood Culture (routine x 2)     Status: None (Preliminary result)   Collection Time: 01/25/16 12:37 PM  Result Value Ref Range Status   Specimen Description BLOOD RIGHT ANTECUBITAL  Final   Special Requests BOTTLES DRAWN AEROBIC AND ANAEROBIC  5CC  Final   Culture NO GROWTH 3 DAYS  Final   Report Status PENDING  Incomplete  Urine culture     Status: Abnormal   Collection Time: 01/25/16  2:47 PM  Result Value Ref Range Status   Specimen Description URINE, RANDOM  Final   Special Requests NONE  Final   Culture <10,000 COLONIES/mL INSIGNIFICANT GROWTH (A)  Final   Report Status 01/26/2016 FINAL  Final  MRSA PCR Screening     Status: None   Collection Time: 01/25/16  5:49 PM  Result Value Ref Range Status   MRSA by PCR NEGATIVE NEGATIVE Final    Comment:        The GeneXpert MRSA Assay (FDA approved for NASAL specimens only), is one component of a comprehensive MRSA colonization surveillance program. It is not intended to diagnose MRSA infection nor to guide or monitor treatment for MRSA infections.   Aerobic/Anaerobic Culture (surgical/deep wound)     Status: None (Preliminary result)   Collection Time: 01/27/16 12:41 PM  Result Value Ref Range  Status   Specimen Description ABDOMEN  Final   Special Requests NONE  Final   Gram Stain   Final    ABUNDANT WBC PRESENT, PREDOMINANTLY PMN FEW GRAM NEGATIVE RODS RARE GRAM POSITIVE COCCI IN PAIRS RARE GRAM POSITIVE COCCOBACILLUS    Culture ABUNDANT GRAM NEGATIVE RODS  Final   Report Status PENDING  Incomplete         Radiology Studies: Ct Aspiration  Result Date: 01/27/2016 INDICATION: 55 year old female with a history of tummy tuck, with abdominal wall hematoma and possible infection. EXAM: CT GUIDANCE NEEDLE PLACEMENT MEDICATIONS: The patient is currently admitted to the hospital and receiving intravenous antibiotics. The antibiotics were administered within an appropriate time frame prior to the initiation of the procedure. ANESTHESIA/SEDATION: Fentanyl 25 mcg IV; Versed 1.0 mg IV Moderate Sedation Time:  10 The patient was continuously monitored during the procedure by the interventional radiology nurse under my direct supervision. COMPLICATIONS: None PROCEDURE: Informed written consent was obtained from the patient after a thorough discussion of the procedural risks, benefits and alternatives. All questions were addressed. Maximal Sterile Barrier Technique was utilized including caps, mask, sterile gowns, sterile gloves, sterile drape, hand hygiene and skin antiseptic. A timeout was performed prior to the initiation of the procedure. Patient is positioned supine position on CT gantry table. Scout CT was performed for planning purposes. The skin and subcutaneous tissues overlying the right lower abdomen were prepped and draped in the usual sterile fashion. The skin and  subcutaneous tissues were generously infiltrated 1% lidocaine for local anesthesia. Using CT guidance, Yueh needle was advanced into the fluid collection and an aspiration was achieved of approximately 10 cc of dark serosanguineous fluid. Sample sent the lab for analysis. Catheter was removed and a sterile bandage was placed.  Patient tolerated the procedure well and remained hemodynamically stable throughout. No complications were encountered and no significant blood loss. IMPRESSION: Status post CT-guided aspiration of abdominal wall hematoma. Sample was sent to the lab for culture. Signed, Dulcy Fanny. Earleen Newport, DO [facility] Radiology Electronically Signed   By: Corrie Mckusick D.O.   On: 01/27/2016 11:51        Scheduled Meds: . sodium chloride   Intravenous Once  . Influenza vac split quadrivalent PF  0.5 mL Intramuscular Tomorrow-1000  . metoprolol tartrate  12.5 mg Oral BID  . pantoprazole  40 mg Oral Daily  . piperacillin-tazobactam (ZOSYN)  IV  3.375 g Intravenous Q8H  . sodium chloride flush  3 mL Intravenous Q12H  . vancomycin  1,500 mg Intravenous Q12H   Continuous Infusions: . sodium chloride 125 mL/hr at 01/28/16 2343     LOS: 4 days    Time spent: 40 minutes    Gatlin Kittell, Geraldo Docker, MD [facility] Pager [phone]   If 7PM-7AM, please contact night-coverage [email or web address] Password [email or web address] 01/29/2016, 8:15 AM

## 2016-01-30 DIAGNOSIS — I2601 Septic pulmonary embolism with acute cor pulmonale: Secondary | ICD-10-CM

## 2016-01-30 LAB — CULTURE, BLOOD (ROUTINE X 2)
CULTURE: NO GROWTH
CULTURE: NO GROWTH

## 2016-01-30 LAB — CBC
HEMATOCRIT: 28 % — AB (ref 36.0–46.0)
Hemoglobin: 8.6 g/dL — ABNORMAL LOW (ref 12.0–15.0)
MCH: 26.3 pg (ref 26.0–34.0)
MCHC: 30.7 g/dL (ref 30.0–36.0)
MCV: 85.6 fL (ref 78.0–100.0)
Platelets: 195 10*3/uL (ref 150–400)
RBC: 3.27 MIL/uL — ABNORMAL LOW (ref 3.87–5.11)
RDW: 17.6 % — ABNORMAL HIGH (ref 11.5–15.5)
WBC: 3.9 10*3/uL — ABNORMAL LOW (ref 4.0–10.5)

## 2016-01-30 MED ORDER — HYDROCODONE-ACETAMINOPHEN 5-325 MG PO TABS: 1.0000 | ORAL_TABLET | ORAL | 0 refills | Status: DC | PRN

## 2016-01-30 MED ORDER — SULFAMETHOXAZOLE-TRIMETHOPRIM 800-160 MG PO TABS
1.0000 | ORAL_TABLET | Freq: Two times a day (BID) | ORAL | Status: DC
  Administered 2016-01-30: 1 via ORAL
  Filled 2016-01-30: qty 1

## 2016-01-30 MED ORDER — SULFAMETHOXAZOLE-TRIMETHOPRIM 800-160 MG PO TABS: 1.0000 | ORAL_TABLET | Freq: Two times a day (BID) | ORAL | 0 refills | Status: DC

## 2016-01-30 NOTE — Discharge Summary (Signed)
[place] Alter to be D/C'd Home per MD order. Discussed with the patient and all questions fully answered.  Allergies as of 01/30/2016      Reactions   Ancef [cefazolin Sodium] Nausea And Vomiting   Contrast Media [iodinated Diagnostic Agents]    apnea   Penicillins Other (See Comments)   Itchy rash as a kid, it happened when she was a child Has patient had a PCN reaction causing immediate rash, facial/tongue/throat swelling, SOB or lightheadedness with hypotension: YES Has patient had a PCN reaction causing severe rash involving mucus membranes or skin necrosis: NO Has patient had a PCN reaction that required hospitalization NO Has patient had a PCN reaction occurring within the last 10 years: NO If all of the above answers are "NO", then may proceed with Cephalosporin use.      Medication List    STOP taking these medications   escitalopram 10 MG tablet Commonly known as:  LEXAPRO   LINZESS 145 MCG Caps capsule Generic drug:  linaclotide   rivaroxaban 20 MG Tabs tablet Commonly known as:  XARELTO   XARELTO 20 MG Tabs tablet Generic drug:  rivaroxaban     TAKE these medications   acetaminophen 500 MG tablet Commonly known as:  TYLENOL Take 500 mg by mouth as needed for mild pain.   Biotin 10000 MCG Tabs Take by mouth daily.   celecoxib 200 MG capsule Commonly known as:  CELEBREX Take 200 mg by mouth as needed for mild pain.   DAYQUIL PO Take 15 mLs by mouth daily as needed (cold symptoms).   docusate sodium 100 MG capsule Commonly known as:  COLACE Take 200 mg by mouth daily.   HYDROcodone-acetaminophen 5-325 MG tablet Commonly known as:  NORCO/VICODIN Take 1-2 tablets by mouth every 4 (four) hours as needed for moderate pain.   metoprolol tartrate 25 MG tablet Commonly known as:  LOPRESSOR Take 0.5 tablets (12.5 mg total) by mouth 2 (two) times daily.   multivitamins ther. w/minerals Tabs tablet Take 1 tablet by mouth daily.   omeprazole 20 MG  capsule Commonly known as:  PRILOSEC Take 20 mg by mouth 2 (two) times daily before a meal.   sulfamethoxazole-trimethoprim 800-160 MG tablet Commonly known as:  BACTRIM DS,SEPTRA DS Take 1 tablet by mouth every 12 (twelve) hours.   VITAMIN C PO Take 1 tablet by mouth daily.   VITAMIN D HIGH POTENCY 1000 units capsule Generic drug:  Cholecalciferol Take 1,000 Units by mouth daily.       VVS, Skin clean, dry and intact without evidence of skin break down, no evidence of skin tears noted.  IV catheter discontinued intact. Site without signs and symptoms of complications. Dressing and pressure applied.  An After Visit Summary was printed and given to the patient.  Patient escorted via [place], and D/C home via private auto.  Audria Nine F  01/30/2016 2:30 PM

## 2016-01-30 NOTE — Progress Notes (Signed)
   Subjective: complaints today. Still intermittent bloody discharge from wound that is improving.  Objective: Vital signs in last 24 hours: Temp:  [97.5 F (36.4 C)-98.6 F (37 C)] 98 F (36.7 C) (12/25 0506) Pulse Rate:  [56-78] 78 (12/25 0855) Resp:  [17-20] 18 (12/25 0506) BP: (110-122)/(59-71) 122/70 (12/25 0506) SpO2:  [99 %-100 %] 99 % (12/25 0506) Last BM Date: 01/29/16  Intake/Output from previous day: 12/24 0701 - 12/25 0700 In: 66 [P.O.:1800; I.V.:3000; IV Piggyback:1100] Out: 2250 [Urine:2250] Intake/Output this shift: Total I/O In: 452.1 [I.V.:452.1] Out: -   General appearance: alert, cooperative and no distress Incision/Wound: No erythema. Ecchymosis gradually improving. Thin bloody drainage from pinpoint opening.  Lab Results:   Recent Labs  01/29/16 0344 01/30/16 0453  WBC  --  3.9*  HGB 8.4* 8.6*  HCT  --  28.0*  PLT  --  195   BMET  Recent Labs  01/28/16 1258 01/29/16 1409  NA 140 139  K 3.5 3.8  CL 109 110  CO2 25 24  GLUCOSE 110* 106*  BUN 6 8  CREATININE 0.58 0.59  CALCIUM 8.2* 8.2*     Studies/Results: No results found.  Anti-infectives: Anti-infectives    Start     Dose/Rate Route Frequency Ordered Stop   01/29/16 0200  vancomycin (VANCOCIN) 1,500 mg in sodium chloride 0.9 % 500 mL IVPB  Status:  Discontinued     1,500 mg 250 mL/hr over 120 Minutes Intravenous Every 12 hours 01/28/16 1507 01/28/16 1527   01/28/16 1600  vancomycin (VANCOCIN) 1,500 mg in sodium chloride 0.9 % 500 mL IVPB     1,500 mg 250 mL/hr over 120 Minutes Intravenous Every 12 hours 01/28/16 1527     01/26/16 0130  vancomycin (VANCOCIN) IVPB 1000 mg/200 mL premix  Status:  Discontinued     1,000 mg 200 mL/hr over 60 Minutes Intravenous Every 12 hours 01/25/16 1318 01/28/16 1507   01/25/16 2200  piperacillin-tazobactam (ZOSYN) IVPB 3.375 g     3.375 g 12.5 mL/hr over 240 Minutes Intravenous Every 8 hours 01/25/16 1318     01/25/16 1215  vancomycin  (VANCOCIN) 2,500 mg in sodium chloride 0.9 % 500 mL IVPB     2,500 mg 250 mL/hr over 120 Minutes Intravenous  Once 01/25/16 1203 01/25/16 1603   01/25/16 1215  piperacillin-tazobactam (ZOSYN) IVPB 3.375 g     3.375 g 100 mL/hr over 30 Minutes Intravenous  Once 01/25/16 1203 01/25/16 1321      Assessment/Plan: Infected hematoma status post panniculectomy in [place]. Spontaneously draining and improving on antibiotics. No indication for surgery. I think at this point she could go home at any time on oral antibiotics which I would continue for at least 2 week total course.    LOS: 5 days    Oluwaseun Bruyere T 01/30/2016

## 2016-01-30 NOTE — Discharge Summary (Signed)
Physician Discharge Summary  [name] [id number] DOB: May 28, 1960 DOA: 01/25/2016  PCP: [facility]  Admit date: 01/25/2016 Discharge date: 01/30/2016  Time spent: 35 minutes  Recommendations for Outpatient Follow-up:   Abdominal Hematoma with drainage, suspect infectious s/p abdominoplasty on 12/11 in [place] .  -Afebrile last 24 hours - Abd CT showed Extensive fluid and soft tissue stranding/edema within the subcutaneous fat of the infraumbilical anterior and lateral abdominal wall with multifocal pockets of gas within the subcutaneous fat.  -12/22 S/P IR IR aspiration hematoma . -Continue Bactrim DS to complete full 2 weeks antibiotics. Per surgery recommendations  -Schedule follow-up appointment in 2 weeks with Dr. Stark Klein, for surgical wound check  Pulmonary Embolism  -on Xarelto since 2015 currently on hold   Atrial Fibrillation (CHA2DS2-VASc score 3 , ) -Xarelto on hold . Discuss restarting at appointment in one week at [facility] family physician -Currently in NSR  -Schedule follow-up appointment in one week with [facility] Physician, paroxysmal atrial fibrillation, PE, DVT, wound check.  Type II Diabetes controlled with complication   Acute Blood loss Anemia  Recent Labs Lab 01/27/16 1146 01/27/16 1950 01/28/16 0654 01/28/16 1258 01/28/16 1820 01/29/16 0344 01/30/16 0453  HGB 8.8* 8.7* 8.3* 9.2* 8.8* 8.4* 8.6*  -Transfuse for hemoglobin <8  -Hg q 8hr -12/21 transfused one unit PRBC -PCP to monitor hemoglobin follow-up  Right Hydronephrosis CT of the abdomen shows  -Dilated right extrarenal pelvis. Mild left hydronephrosis. 4 mm stone within the lower left kidney. Denies dysuria or hematuria. Denies Flank pain .  -Urinalysis not consistent with infection -PCP to monitor for changes.    Discharge Diagnoses:  Active Problems:   Diabetes mellitus ([place])   Pulmonary embolism (HCC)   ASD (atrial septal defect)    Wound infection   Hydronephrosis, left   Hydronephrosis, right   DVT (deep venous thrombosis) (HCC)   Atrial fibrillation (HCC)   Diabetes mellitus with complication (HCC)   Wound infection after surgery, initial encounter   Abdominal hematoma   Post-operative state   Paroxysmal atrial fibrillation (HCC)   Controlled type 2 diabetes mellitus with diabetic nephropathy ([place])   Discharge Condition: Stable  Diet recommendation: American diabetic Association  Filed Weights   01/25/16 1122 01/27/16 0310  Weight: 114.3 kg (252 lb) 119.7 kg (264 lb)    History of present illness:  55 y.o. WF PMHx  A-fib, PE/DVT on Xarelto, DM2 controlled with complication, PVD, HLD,Bleeding ulcer, Anemia  Presenting via EMS with c/p post op complications after abdominoplasty done in [place] in 12/11. She had been discharged on 12/12 with Cipro but her status as complicated by abdominal hematoma. SHe reported returning to the hospital while there for hematoma drainage.She continuedtaking Cipro on her own and flew back today to the [place]. She reports odorous, dark blood drainage in the RLQ. She was dizzy and dehydrated, feeling weak. Denies any fever, chills, night sweats, nausea or vomiting. No chest pain or shortness of breath . Denies dysuria, hematuria or trouble with bowel movements. Of note, she did report right flank discomfort during her flight . She denies any lower extremity swelling.  During his hospitalization patient underwent aspiration of abdominal hematoma (hematoma complication of abdominoplasty). Aspirate grew out Escherichia coli. Patient responded to broad-spectrum antibiotics while hospitalized, and will be sent home on appropriate antibiotics to complete 2 week course.   Consultants:  Dr.Chelsea A Connor CCS D.O.Jaime Wagner IR    Procedures/Significant Events:  12/20 CT abdomen pelvis wo  contrast:-Extensive fluid and soft tissue stranding/edema within the subcutaneous fat of the  infraumbilical anterior and lateral abdominal wall. -multifocal pockets of gas within the subcutaneous fat. Areas of increased density are present within the fluid and fat stranding suggestive of hematoma.  -Punctate stone in the left kidney as before. -Dilated right extrarenal pelvis with mild left hydronephrosis, no evidence for ureteral stone. 12/21 transfuse one unit PRBC 12/22 IR CT guided aspiration of abdominal wall hematoma~10 mL  Cultures 12/20 blood 2 pending 12/20 urine insignificant growth 12/20 MRSA by PCR negative 12/22 abdominal hematoma positive ESCHERICHIA COLI    Antibiotics Anti-infectives    Start     Stop   01/30/16 1215  sulfamethoxazole-trimethoprim (BACTRIM DS,SEPTRA DS) 800-160 MG per tablet 1 tablet     02/08/16 0959   01/29/16 0200  vancomycin (VANCOCIN) 1,500 mg in sodium chloride 0.9 % 500 mL IVPB  Status:  Discontinued     01/28/16 1527   01/28/16 1600  vancomycin (VANCOCIN) 1,500 mg in sodium chloride 0.9 % 500 mL IVPB  Status:  Discontinued     01/30/16 1206   01/26/16 0130  vancomycin (VANCOCIN) IVPB 1000 mg/200 mL premix  Status:  Discontinued     01/28/16 1507   01/25/16 2200  piperacillin-tazobactam (ZOSYN) IVPB 3.375 g  Status:  Discontinued     01/30/16 1206   01/25/16 1215  vancomycin (VANCOCIN) 2,500 mg in sodium chloride 0.9 % 500 mL IVPB     01/25/16 1603   01/25/16 1215  piperacillin-tazobactam (ZOSYN) IVPB 3.375 g     01/25/16 1321       Discharge Exam: Vitals:   01/29/16 2126 01/29/16 2242 01/30/16 0506 01/30/16 0855  BP: 110/71 (!) 118/59 122/70   Pulse: 62 (!) 56 67 78  Resp:  18 18   Temp:  97.5 F (36.4 C) 98 F (36.7 C)   TempSrc:  Oral Oral   SpO2:  99% 99%   Weight:      Height:        General: Abdominal pain, No acute respiratory distress Eyes: negative scleral hemorrhage, negative anisocoria, negative icterus ENT: Negative Runny nose, negative gingival bleeding, Neck:  Negative scars, masses, torticollis,  lymphadenopathy, JVD Lungs: Clear to auscultation bilaterally without wheezes or crackles Cardiovascular: Regular rate and rhythm without murmur gallop or rub normal S1 and S2 Abdomen: Negative abdominal pain, negative distention, positive horizontal incision line w/o sanguinous discharge   Discharge Instructions   Allergies as of 01/30/2016      Reactions   Ancef [cefazolin Sodium] Nausea And Vomiting   Contrast Media [iodinated Diagnostic Agents]    apnea   Penicillins Other (See Comments)   Itchy rash as a kid, it happened when she was a child Has patient had a PCN reaction causing immediate rash, facial/tongue/throat swelling, SOB or lightheadedness with hypotension: YES Has patient had a PCN reaction causing severe rash involving mucus membranes or skin necrosis: NO Has patient had a PCN reaction that required hospitalization NO Has patient had a PCN reaction occurring within the last 10 years: NO If all of the above answers are "NO", then may proceed with Cephalosporin use.      Medication List    STOP taking these medications   escitalopram 10 MG tablet Commonly known as:  LEXAPRO   LINZESS 145 MCG Caps capsule Generic drug:  linaclotide   rivaroxaban 20 MG Tabs tablet Commonly known as:  XARELTO   XARELTO 20 MG Tabs tablet Generic drug:  rivaroxaban  TAKE these medications   acetaminophen 500 MG tablet Commonly known as:  TYLENOL Take 500 mg by mouth as needed for mild pain.   Biotin 10000 MCG Tabs Take by mouth daily.   celecoxib 200 MG capsule Commonly known as:  CELEBREX Take 200 mg by mouth as needed for mild pain.   DAYQUIL PO Take 15 mLs by mouth daily as needed (cold symptoms).   docusate sodium 100 MG capsule Commonly known as:  COLACE Take 200 mg by mouth daily.   HYDROcodone-acetaminophen 5-325 MG tablet Commonly known as:  NORCO/VICODIN Take 1-2 tablets by mouth every 4 (four) hours as needed for moderate pain.   metoprolol tartrate 25  MG tablet Commonly known as:  LOPRESSOR Take 0.5 tablets (12.5 mg total) by mouth 2 (two) times daily.   multivitamins ther. w/minerals Tabs tablet Take 1 tablet by mouth daily.   omeprazole 20 MG capsule Commonly known as:  PRILOSEC Take 20 mg by mouth 2 (two) times daily before a meal.   sulfamethoxazole-trimethoprim 800-160 MG tablet Commonly known as:  BACTRIM DS,SEPTRA DS Take 1 tablet by mouth every 12 (twelve) hours.   VITAMIN C PO Take 1 tablet by mouth daily.   VITAMIN D HIGH POTENCY 1000 units capsule Generic drug:  Cholecalciferol Take 1,000 Units by mouth daily.      Allergies  Allergen Reactions  . Ancef [Cefazolin Sodium] Nausea And Vomiting  . Contrast Media [Iodinated Diagnostic Agents]     apnea  . Penicillins Other (See Comments)    Itchy rash as a kid, it happened when she was a child Has patient had a PCN reaction causing immediate rash, facial/tongue/throat swelling, SOB or lightheadedness with hypotension: YES Has patient had a PCN reaction causing severe rash involving mucus membranes or skin necrosis: NO Has patient had a PCN reaction that required hospitalization NO Has patient had a PCN reaction occurring within the last 10 years: NO If all of the above answers are "NO", then may proceed with Cephalosporin use.   Follow-up Information    [facility]. Schedule an appointment as soon as possible for a visit in 1 week(s).   Specialty:  Family Medicine Why:  Scheduled follow-up appointment in one week with [facility] Physician, paroxysmal atrial fibrillation, PE, DVT, wound check. Contact information: [place] [address] [phone]        Stark Klein, MD. Schedule an appointment as soon as possible for a visit in 2 week(s).   Specialty:  General Surgery Why:  Schedule follow-up appointment in 2 weeks with Dr. Stark Klein, for surgical wound check Contact information: [place]  [address] [phone]            The results of significant diagnostics from this hospitalization (including imaging, microbiology, ancillary and laboratory) are listed below for reference.    Significant Diagnostic Studies: Ct Abdomen Pelvis Wo Contrast  Result Date: 01/25/2016 CLINICAL DATA:  Recent abdominal plasty with fever and diffuse lower abdominal pain EXAM: CT ABDOMEN AND PELVIS WITHOUT CONTRAST TECHNIQUE: Multidetector CT imaging of the abdomen and pelvis was performed following the standard protocol without IV contrast. COMPARISON:  01/05/2016 FINDINGS: Lower chest: The lung bases demonstrate minimal atelectasis. No acute consolidation or pleural effusion is visualized. Heart size is nonenlarged. Hepatobiliary: No focal hepatic abnormality. Surgical clips in the gallbladder fossa. No intra hepatic biliary dilatation. Mildly prominent extrahepatic common bile duct at the porta hepatis is fell postsurgical. Pancreas: Unremarkable.  No pancreatic ductal dilatation or surrounding inflammatory changes. Spleen: Normal in size without focal abnormality. Adrenals/Urinary Tract: Adrenal glands are within normal limits. Dilated right extrarenal pelvis. Mild left hydronephrosis. There is a 3-4 mm stone within the lower left kidney. There is no evidence for ureteral stone. The bladder is enlarged. Stomach/Bowel: Stomach demonstrates evidence of prior laparoscopic banding. There is a small to moderate hiatal hernia. No evidence for dilated small bowel. Moderate stool in the colon. No colon wall thickening. Diverticular disease of the sigmoid colon without acute inflammation. Vascular/Lymphatic: Atherosclerosis of the aorta. No significantly enlarged lymph nodes. Reproductive: Uterus and bilateral adnexa are unremarkable. Other: Extensive fluid and soft tissue stranding within the infraumbilical abdominal wall. Multifocal areas of increased attenuation suggestive of hemorrhage. Multiple  pockets of gas present within the subcutaneous with more focal gas collection within the subcutaneous fat of the right lower quadrant. Small pockets of subcutaneous gas present deep to the umbilicus, soft tissue thickening is present at the umbilicus. No free air Musculoskeletal: There are degenerative changes of the spine. No acute osseous abnormality. IMPRESSION: 1. Extensive fluid and soft tissue stranding/edema within the subcutaneous fat of the infraumbilical anterior and lateral abdominal wall. There are multifocal pockets of gas within the subcutaneous fat. Areas of increased density are present within the fluid and fat stranding suggestive of hematoma. Findings could be secondary to recent abdominal plasty, however the presence of infection cannot be ruled out. 2. Punctate stone in the left kidney as before. Dilated right extrarenal pelvis with mild left hydronephrosis, no evidence for ureteral stone. Findings could be secondary to marked bladder distention 3. No acute inflammatory process is visualized within the abdominal or pelvic cavity 4. Sigmoid colon diverticular disease without acute inflammation 5. Stable gastric banding.  No evidence for obstruction. Electronically Signed   By: Donavan Foil M.D.   On: 01/25/2016 14:50   Dg Chest 2 View  Result Date: 01/25/2016 CLINICAL DATA:  Fever.  Recent surgery. EXAM: CHEST  2 VIEW COMPARISON:  05/04/2015 FINDINGS: The heart size and mediastinal contours are within normal limits. Both lungs are clear. The visualized skeletal structures are unremarkable. IMPRESSION: No active cardiopulmonary disease. Electronically Signed   By: Franchot Gallo M.D.   On: 01/25/2016 13:25   Ct Aspiration  Result Date: 01/27/2016 INDICATION: 55 year old female with a history of tummy tuck, with abdominal wall hematoma and possible infection. EXAM: CT GUIDANCE NEEDLE PLACEMENT MEDICATIONS: The patient is currently admitted to the hospital and receiving intravenous  antibiotics. The antibiotics were administered within an appropriate time frame prior to the initiation of the procedure. ANESTHESIA/SEDATION: Fentanyl 25 mcg IV; Versed 1.0 mg IV Moderate Sedation Time:  10 The patient was continuously monitored during the procedure by the interventional radiology nurse under my direct supervision. COMPLICATIONS: None PROCEDURE: Informed written consent was obtained from the patient after a thorough discussion of the procedural risks, benefits and alternatives. All questions were addressed. Maximal Sterile Barrier Technique was utilized including caps, mask, sterile gowns, sterile gloves, sterile drape, hand hygiene and skin antiseptic. A timeout was performed prior to the initiation of the procedure. Patient is positioned supine position on CT gantry table. Scout CT was performed for planning purposes. The skin and subcutaneous tissues overlying the right lower abdomen were prepped and draped in the usual sterile fashion. The skin and subcutaneous tissues were generously infiltrated 1% lidocaine for local anesthesia. Using CT guidance, Yueh needle was advanced into the fluid collection and an aspiration was achieved of approximately 10  cc of dark serosanguineous fluid. Sample sent the lab for analysis. Catheter was removed and a sterile bandage was placed. Patient tolerated the procedure well and remained hemodynamically stable throughout. No complications were encountered and no significant blood loss. IMPRESSION: Status post CT-guided aspiration of abdominal wall hematoma. Sample was sent to the lab for culture. Signed, Dulcy Fanny. Earleen Newport, DO [facility] Electronically Signed   By: Corrie Mckusick D.O.   On: 01/27/2016 11:51    Microbiology: Recent Results (from the past 240 hour(s))  Blood Culture (routine x 2)     Status: None (Preliminary result)   Collection Time: 01/25/16 11:20 AM  Result Value Ref Range Status    Specimen Description BLOOD RIGHT HAND  Final   Special Requests BOTTLES DRAWN AEROBIC AND ANAEROBIC  5CC  Final   Culture NO GROWTH 4 DAYS  Final   Report Status PENDING  Incomplete  Blood Culture (routine x 2)     Status: None (Preliminary result)   Collection Time: 01/25/16 12:37 PM  Result Value Ref Range Status   Specimen Description BLOOD RIGHT ANTECUBITAL  Final   Special Requests BOTTLES DRAWN AEROBIC AND ANAEROBIC  5CC  Final   Culture NO GROWTH 4 DAYS  Final   Report Status PENDING  Incomplete  Urine culture     Status: Abnormal   Collection Time: 01/25/16  2:47 PM  Result Value Ref Range Status   Specimen Description URINE, RANDOM  Final   Special Requests NONE  Final   Culture <10,000 COLONIES/mL INSIGNIFICANT GROWTH (A)  Final   Report Status 01/26/2016 FINAL  Final  MRSA PCR Screening     Status: None   Collection Time: 01/25/16  5:49 PM  Result Value Ref Range Status   MRSA by PCR NEGATIVE NEGATIVE Final    Comment:        The GeneXpert MRSA Assay (FDA approved for NASAL specimens only), is one component of a comprehensive MRSA colonization surveillance program. It is not intended to diagnose MRSA infection nor to guide or monitor treatment for MRSA infections.   Aerobic/Anaerobic Culture (surgical/deep wound)     Status: None (Preliminary result)   Collection Time: 01/27/16 12:41 PM  Result Value Ref Range Status   Specimen Description ABDOMEN  Final   Special Requests NONE  Final   Gram Stain   Final    ABUNDANT WBC PRESENT, PREDOMINANTLY PMN FEW GRAM NEGATIVE RODS RARE GRAM POSITIVE COCCI IN PAIRS RARE GRAM POSITIVE COCCOBACILLUS    Culture   Final    ABUNDANT ESCHERICHIA COLI NO ANAEROBES ISOLATED; CULTURE IN PROGRESS FOR 5 DAYS    Report Status PENDING  Incomplete   Organism ID, Bacteria ESCHERICHIA COLI  Final      Susceptibility   Escherichia coli - MIC*    AMPICILLIN <=2 SENSITIVE Sensitive     CEFAZOLIN <=4 SENSITIVE Sensitive     CEFEPIME  <=1 SENSITIVE Sensitive     CEFTAZIDIME <=1 SENSITIVE Sensitive     CEFTRIAXONE <=1 SENSITIVE Sensitive     CIPROFLOXACIN >=4 RESISTANT Resistant     GENTAMICIN <=1 SENSITIVE Sensitive     IMIPENEM <=0.25 SENSITIVE Sensitive     TRIMETH/SULFA <=20 SENSITIVE Sensitive     AMPICILLIN/SULBACTAM <=2 SENSITIVE Sensitive     PIP/TAZO <=4 SENSITIVE Sensitive     Extended ESBL NEGATIVE Sensitive     * ABUNDANT ESCHERICHIA COLI     Labs: Basic Metabolic Panel:  Recent Labs Lab 01/25/16 1127 01/26/16  [id number] 01/28/16 1258 01/29/16 1409  NA 135 139 140 139  K 3.6 3.7 3.5 3.8  CL 104 110 109 110  CO2 22 24 25 24   GLUCOSE 116* 88 110* 106*  BUN 19 9 6 8   CREATININE 0.79 0.67 0.58 0.59  CALCIUM 8.4* 7.7* 8.2* 8.2*   Liver Function Tests:  Recent Labs Lab 01/25/16 1127 01/26/16 0614  AST 17 12*  ALT 15 11*  ALKPHOS 59 49  BILITOT 1.7* 1.4*  PROT 5.7* 5.1*  ALBUMIN 2.9* 2.3*   No results for input(s): LIPASE, AMYLASE in the last 168 hours. No results for input(s): AMMONIA in the last 168 hours. CBC:  Recent Labs Lab 01/25/16 1127 01/26/16 0614  01/28/16 0654 01/28/16 1258 01/28/16 1820 01/29/16 0344 01/30/16 0453  WBC 11.0* 5.6  --   --   --   --   --  3.9*  NEUTROABS 9.1*  --   --   --   --   --   --   --   HGB 10.1* 8.3*  < > 8.3* 9.2* 8.8* 8.4* 8.6*  HCT 32.0* 26.4*  --   --   --   --   --  28.0*  MCV 83.3 84.6  --   --   --   --   --  85.6  PLT 223 179  --   --   --   --   --  195  < > = values in this interval not displayed. Cardiac Enzymes:  Recent Labs Lab 01/25/16 1912 01/25/16 2217  TROPONINI <0.03 <0.03   BNP: BNP (last 3 results) No results for input(s): BNP in the last 8760 hours.  ProBNP (last 3 results) No results for input(s): PROBNP in the last 8760 hours.  CBG:  Recent Labs Lab 01/27/16 1617 01/27/16 2139 01/28/16 0754 01/28/16 1132 01/28/16 1559  GLUCAP 104* 112* 74 82 89       Signed:  Dia Crawford, MD [facility] [phone] pager

## 2016-01-31 ENCOUNTER — Ambulatory Visit: Payer: Self-pay | Admitting: Rheumatology

## 2016-02-01 LAB — AEROBIC/ANAEROBIC CULTURE W GRAM STAIN (SURGICAL/DEEP WOUND)

## 2016-02-01 LAB — AEROBIC/ANAEROBIC CULTURE (SURGICAL/DEEP WOUND)

## 2016-02-08 DIAGNOSIS — D62 Acute posthemorrhagic anemia: Secondary | ICD-10-CM | POA: Diagnosis not present

## 2016-02-08 DIAGNOSIS — I2699 Other pulmonary embolism without acute cor pulmonale: Secondary | ICD-10-CM | POA: Diagnosis not present

## 2016-02-08 DIAGNOSIS — T814XXA Infection following a procedure, initial encounter: Secondary | ICD-10-CM | POA: Diagnosis not present

## 2016-02-27 DIAGNOSIS — T8189XD Other complications of procedures, not elsewhere classified, subsequent encounter: Secondary | ICD-10-CM | POA: Diagnosis not present

## 2016-03-12 DIAGNOSIS — L7633 Postprocedural seroma of skin and subcutaneous tissue following a dermatologic procedure: Secondary | ICD-10-CM | POA: Diagnosis not present

## 2016-03-15 ENCOUNTER — Other Ambulatory Visit (HOSPITAL_COMMUNITY): Payer: Self-pay | Admitting: General Surgery

## 2016-03-15 DIAGNOSIS — S301XXD Contusion of abdominal wall, subsequent encounter: Secondary | ICD-10-CM

## 2016-03-21 ENCOUNTER — Other Ambulatory Visit: Payer: Self-pay | Admitting: Radiology

## 2016-03-22 ENCOUNTER — Encounter (HOSPITAL_COMMUNITY): Payer: Self-pay

## 2016-03-22 ENCOUNTER — Ambulatory Visit (HOSPITAL_COMMUNITY)
Admission: RE | Admit: 2016-03-22 | Discharge: 2016-03-22 | Disposition: A | Discharge status: Home / Self Care | Payer: 59 | Source: Ambulatory Visit | Attending: General Surgery | Admitting: General Surgery

## 2016-03-22 DIAGNOSIS — Z86711 Personal history of pulmonary embolism: Secondary | ICD-10-CM | POA: Diagnosis not present

## 2016-03-22 DIAGNOSIS — B9620 Unspecified Escherichia coli [E. coli] as the cause of diseases classified elsewhere: Secondary | ICD-10-CM | POA: Diagnosis not present

## 2016-03-22 DIAGNOSIS — T792XXD Traumatic secondary and recurrent hemorrhage and seroma, subsequent encounter: Secondary | ICD-10-CM | POA: Diagnosis not present

## 2016-03-22 DIAGNOSIS — E1151 Type 2 diabetes mellitus with diabetic peripheral angiopathy without gangrene: Secondary | ICD-10-CM | POA: Diagnosis not present

## 2016-03-22 DIAGNOSIS — M7989 Other specified soft tissue disorders: Secondary | ICD-10-CM | POA: Insufficient documentation

## 2016-03-22 DIAGNOSIS — Z880 Allergy status to penicillin: Secondary | ICD-10-CM | POA: Diagnosis not present

## 2016-03-22 DIAGNOSIS — Z9884 Bariatric surgery status: Secondary | ICD-10-CM | POA: Diagnosis not present

## 2016-03-22 DIAGNOSIS — Z9049 Acquired absence of other specified parts of digestive tract: Secondary | ICD-10-CM | POA: Insufficient documentation

## 2016-03-22 DIAGNOSIS — R109 Unspecified abdominal pain: Secondary | ICD-10-CM | POA: Diagnosis not present

## 2016-03-22 DIAGNOSIS — K219 Gastro-esophageal reflux disease without esophagitis: Secondary | ICD-10-CM | POA: Diagnosis not present

## 2016-03-22 DIAGNOSIS — E785 Hyperlipidemia, unspecified: Secondary | ICD-10-CM | POA: Diagnosis not present

## 2016-03-22 DIAGNOSIS — Z841 Family history of disorders of kidney and ureter: Secondary | ICD-10-CM | POA: Diagnosis not present

## 2016-03-22 DIAGNOSIS — I4891 Unspecified atrial fibrillation: Secondary | ICD-10-CM | POA: Diagnosis not present

## 2016-03-22 DIAGNOSIS — Z888 Allergy status to other drugs, medicaments and biological substances status: Secondary | ICD-10-CM | POA: Diagnosis not present

## 2016-03-22 DIAGNOSIS — Z833 Family history of diabetes mellitus: Secondary | ICD-10-CM | POA: Diagnosis not present

## 2016-03-22 DIAGNOSIS — T888XXD Other specified complications of surgical and medical care, not elsewhere classified, subsequent encounter: Secondary | ICD-10-CM | POA: Diagnosis not present

## 2016-03-22 DIAGNOSIS — Z9889 Other specified postprocedural states: Secondary | ICD-10-CM | POA: Insufficient documentation

## 2016-03-22 DIAGNOSIS — Z8261 Family history of arthritis: Secondary | ICD-10-CM | POA: Insufficient documentation

## 2016-03-22 DIAGNOSIS — S301XXD Contusion of abdominal wall, subsequent encounter: Secondary | ICD-10-CM

## 2016-03-22 DIAGNOSIS — Z8249 Family history of ischemic heart disease and other diseases of the circulatory system: Secondary | ICD-10-CM | POA: Diagnosis not present

## 2016-03-22 DIAGNOSIS — Z7901 Long term (current) use of anticoagulants: Secondary | ICD-10-CM | POA: Diagnosis not present

## 2016-03-22 DIAGNOSIS — L7634 Postprocedural seroma of skin and subcutaneous tissue following other procedure: Secondary | ICD-10-CM | POA: Diagnosis not present

## 2016-03-22 LAB — CBC
HCT: 36.2 % (ref 36.0–46.0)
Hemoglobin: 10.8 g/dL — ABNORMAL LOW (ref 12.0–15.0)
MCH: 24.5 pg — ABNORMAL LOW (ref 26.0–34.0)
MCHC: 29.8 g/dL — AB (ref 30.0–36.0)
MCV: 82.1 fL (ref 78.0–100.0)
Platelets: 202 10*3/uL (ref 150–400)
RBC: 4.41 MIL/uL (ref 3.87–5.11)
RDW: 15.5 % (ref 11.5–15.5)
WBC: 4.4 10*3/uL (ref 4.0–10.5)

## 2016-03-22 LAB — PROTIME-INR
INR: 1.29
PROTHROMBIN TIME: 16.1 s — AB (ref 11.4–15.2)

## 2016-03-22 MED ORDER — FENTANYL CITRATE (PF) 100 MCG/2ML IJ SOLN
INTRAMUSCULAR | Status: AC | PRN
  Administered 2016-03-22: 25 ug via INTRAVENOUS

## 2016-03-22 MED ORDER — SODIUM CHLORIDE 0.9 % IV SOLN: INTRAVENOUS | Status: DC

## 2016-03-22 MED ORDER — LIDOCAINE HCL 1 % IJ SOLN
INTRAMUSCULAR | Status: AC
  Filled 2016-03-22: qty 20

## 2016-03-22 MED ORDER — MIDAZOLAM HCL 2 MG/2ML IJ SOLN
INTRAMUSCULAR | Status: AC | PRN
  Administered 2016-03-22: 1 mg via INTRAVENOUS

## 2016-03-22 MED ORDER — MIDAZOLAM HCL 2 MG/2ML IJ SOLN
INTRAMUSCULAR | Status: AC
  Filled 2016-03-22: qty 2

## 2016-03-22 MED ORDER — FENTANYL CITRATE (PF) 100 MCG/2ML IJ SOLN
INTRAMUSCULAR | Status: AC
  Filled 2016-03-22: qty 2

## 2016-03-22 NOTE — Procedures (Signed)
Interventional Radiology Procedure Note  Procedure: US guided aspiration of abdominal wall fluid collection/seroma  Complications: None  Estimated Blood Loss: None  Findings:  Small complex fluid collection in midline subcu abdominal wall.  Aspiration yielded small amount of thick fluid and debris. Irrigated with sterile saline.  Sample sent for culture.  Venetia Night. Kathlene Cote, M.D Pager:  [phone]

## 2016-03-22 NOTE — Progress Notes (Signed)
Patient discharged to home with husband. Discharge instructions given to patient.

## 2016-03-22 NOTE — H&P (Signed)
Chief Complaint: Patient was seen in consultation today for abdominal seroma aspiration at the request of Ramirez,Armando  Referring Physician(s): Ramirez,Armando  Supervising Physician: Aletta Edouard  Patient Status: [facility] - Out-pt  History of Present Illness: [name] is a 56 y.o. female   Abdominoplasty 01/16/2016 (in [place]) Was treated for abdominal hematoma post surgery while there Abdominal pain continued after antibiotic treatment  CT 12/20 revealed hematoma/seroma Aspiration performed in IR 01/27/16: 10 cc dark fluid + Ecoli and treated with 2 rounds of antibiotics per pt. Did well for few weeks Now notes abdominal bloating and skin tightness Dr Rosendo Gros feels seroma recollecting Do not see [facility] note or imaging in chart  Request for image guided seroma aspiration/drain placement    Past Medical History:  Diagnosis Date  . Anemia   . Arthritis   . Atrial fibrillation ([place])   . Bleeding ulcer 12-2010  hospitalized for 1 week  . Blood transfusion   . Blood transfusion without reported diagnosis    2011-2 units transfused, bleeding ulcer  . Bruises easily   . Constipation   . Diabetes mellitus    Pt had DM prior to Lap Band. Does not have it now per pt.  . Difficulty in swallowing 03-24-13   no longer a problem  . DVT (deep venous thrombosis) ([place]) 06-2013   right leg  . GERD (gastroesophageal reflux disease)   . Hyperlipidemia   . Leg swelling 03-24-13   not a problem any longer  . Peripheral vascular disease ([place])   . Pulmonary embolus ([place]) 06-2013  . Reflux   . Shortness of breath   . Vomiting 03-24-13   problem resolved after lap band revision    Past Surgical History:  Procedure Laterality Date  . APPENDECTOMY    . CARPEL TUNNEL BILATEREAL    . CHOLECYSTECTOMY     5'12  . COLONOSCOPY N/A 04/10/2013   Procedure: COLONOSCOPY;  Surgeon: Beryle Beams, MD;  Location: [facility] ENDOSCOPY;  Service: Endoscopy;  Laterality: N/A;  . DIAGNOSTIC  LAPAROSCOPY    . ENDOVENOUS ABLATION SAPHENOUS VEIN W/ LASER  06-2008  . ENDOVENOUS ABLATION SAPHENOUS VEIN W/ LASER Left 12-09-2013   EVLA LEFT GREATER SAPHENOUS VEIN BY TODD EARLY MD  . ESOPHAGOGASTRODUODENOSCOPY  12/18/2010   Procedure: ESOPHAGOGASTRODUODENOSCOPY (EGD);  Surgeon: Gatha Mayer, MD;  Location: [facility];  Service: Endoscopy;  Laterality: N/A;  . LAPAROSCOPIC GASTRIC BANDING  01-05-2008  . LAPAROSCOPIC GASTRIC BANDING  12/18/2010   01/04/2009  . SALPHINGOOOPHORECTOMY Right    cyst removal  . TONSILLECTOMY    . WEDGE RESECTION OF OVARY  LEFT OVARY     Allergies: Ancef [cefazolin sodium]; Contrast media [iodinated diagnostic agents]; and Penicillins  Medications: Prior to Admission medications   Medication Sig Start Date End Date Taking? Authorizing Provider  Ascorbic Acid (VITAMIN C PO) Take 1 tablet by mouth daily.   Yes Historical Provider, MD  Cholecalciferol (VITAMIN D HIGH POTENCY) 1000 units capsule Take 1,000 Units by mouth daily.   Yes Historical Provider, MD  metoprolol tartrate (LOPRESSOR) 25 MG tablet Take 0.5 tablets (12.5 mg total) by mouth 2 (two) times daily. 06/13/15  Yes Thayer Headings, MD  Multiple Vitamins-Minerals (MULTIVITAMINS THER. W/MINERALS) TABS Take 1 tablet by mouth daily.     Yes Historical Provider, MD  omeprazole (PRILOSEC) 20 MG capsule Take 20 mg by mouth 2 (two) times daily before a meal.   Yes Historical Provider, MD  rivaroxaban (XARELTO) 20 MG TABS tablet  Take 20 mg by mouth daily with supper.   Yes Historical Provider, MD  acetaminophen (TYLENOL) 500 MG tablet Take 500 mg by mouth as needed for mild pain.     Historical Provider, MD  Biotin 10000 MCG TABS Take by mouth daily.    Historical Provider, MD  celecoxib (CELEBREX) 200 MG capsule Take 200 mg by mouth as needed for mild pain.     Historical Provider, MD     Family History  Problem Relation Age of Onset  . Heart disease Father   . Kidney disease Father   . Peripheral  vascular disease Father   . Heart attack Father     107  . Diabetes Mother   . Arthritis Mother     Social History   Social History  . Marital status: Married    Spouse name: N/A  . Number of children: N/A  . Years of education: N/A   Occupational History  . RN [employer]   Social History Main Topics  . Smoking status: Never Smoker  . Smokeless tobacco: Never Used  . Alcohol use No  . Drug use: No  . Sexual activity: Yes    Partners: Male    Birth control/ protection: None     Comment: declined condoms   Other Topics Concern  . None   Social History Narrative  . None    Review of Systems: A 12 point ROS discussed and pertinent positives are indicated in the HPI above.  All other systems are negative.  Review of Systems  Constitutional: Negative for activity change, appetite change, fatigue and fever.  Respiratory: Negative for shortness of breath.   Cardiovascular: Negative for chest pain.  Gastrointestinal: Positive for abdominal distention and abdominal pain. Negative for nausea.  Neurological: Negative for weakness.  Psychiatric/Behavioral: Negative for behavioral problems and confusion.    Vital Signs: BP 121/76   Pulse 67   Temp 98.1 F (36.7 C) (Oral)   Ht 5\' 6"  (1.676 m)   Wt 255 lb (115.7 kg)   SpO2 100%   BMI 41.16 kg/m   Physical Exam  Constitutional: She is oriented to person, place, and time.  Cardiovascular: Normal rate, regular rhythm and normal heart sounds.   Pulmonary/Chest: Effort normal and breath sounds normal. She has no wheezes.  Abdominal: Soft. Bowel sounds are normal. She exhibits distension. There is tenderness.  Musculoskeletal: Normal range of motion.  Neurological: She is alert and oriented to person, place, and time.  Skin: Skin is warm and dry.  Psychiatric: She has a normal mood and affect. Her behavior is normal. Judgment and thought content normal.  Nursing note and vitals reviewed.   Mallampati Score:  MD  Evaluation Airway: WNL Heart: WNL Abdomen: WNL Chest/ Lungs: WNL ASA  Classification: 2 Mallampati/Airway Score: One  Imaging: No results found.  Labs:  CBC:  Recent Labs  01/25/16 1127 01/26/16 0614  01/28/16 1820 01/29/16 0344 01/30/16 0453 03/22/16 0615  WBC 11.0* 5.6  --   --   --  3.9* 4.4  HGB 10.1* 8.3*  < > 8.8* 8.4* 8.6* 10.8*  HCT 32.0* 26.4*  --   --   --  28.0* 36.2  PLT 223 179  --   --   --  195 202  < > = values in this interval not displayed.  COAGS:  Recent Labs  01/25/16 1127 01/26/16 0614  INR 1.58 1.36    BMP:  Recent Labs  01/25/16 1127 01/26/16 0614 01/28/16  1258 01/29/16 1409  NA 135 139 140 139  K 3.6 3.7 3.5 3.8  CL 104 110 109 110  CO2 22 24 25 24   GLUCOSE 116* 88 110* 106*  BUN 19 9 6 8   CALCIUM 8.4* 7.7* 8.2* 8.2*  CREATININE 0.79 0.67 0.58 0.59  GFRNONAA >60 >60 >60 >60  GFRAA >60 >60 >60 >60    LIVER FUNCTION TESTS:  Recent Labs  01/25/16 1127 01/26/16 0614  BILITOT 1.7* 1.4*  AST 17 12*  ALT 15 11*  ALKPHOS 59 49  PROT 5.7* 5.1*  ALBUMIN 2.9* 2.3*    TUMOR MARKERS: No results for input(s): AFPTM, CEA, CA199, CHROMGRNA in the last 8760 hours.  Assessment and Plan:  Abdominal seroma post abdominoplasty 01/16/16 Scheduled for aspiration--possible drain placement Risks and Benefits discussed with the patient including bleeding, infection, damage to adjacent structures, bowel perforation/fistula connection, and sepsis. All of the patient's questions were answered, patient is agreeable to proceed. Consent signed and in chart.   Thank you for this interesting consult.  I greatly enjoyed meeting [name] and look forward to participating in their care.  A copy of this report was sent to the requesting provider on this date.  Electronically Signed: Monia Sabal A 03/22/2016, 7:19 AM   I spent a total of    25 Minutes in face to face in clinical consultation, greater than 50% of which was  counseling/coordinating care for abdominal seroma aspiration

## 2016-03-22 NOTE — Discharge Instructions (Addendum)
Seroma A seroma is a collection of fluid on the body that looks like swelling or a mass. Seromas form where tissue has been injured or cut. Seromas vary in size. Some are small and painless. Others may become large and cause pain or discomfort. Many seromas go away on their own as the fluid is naturally absorbed by the body, and some seromas need to be drained. What are the causes? Seromas form as the result of damage to tissue or the removal of tissue. This tissue damage may occur during surgery or because of an injury or trauma. When tissue is disrupted or removed, empty space is created. The bodys natural defense system (immune system) causes fluid to enter the empty space and form a seroma. What are the signs or symptoms? Symptoms of this condition include:  Swelling at the site of a surgical cut (incision) or an injury.  Drainage of clear fluid at the surgery or injury site.  Discomfort or pain. How is this diagnosed? This condition is diagnosed based on your symptoms, your medical history, and a physical exam. During the exam, your health care provider will press on the seroma. You may also have tests, including:  Blood tests.  Imaging tests, such as an ultrasound or CT scan. How is this treated? Some seromas go away (resolve) on their own. Your health care provider may monitor you to make sure the seroma does not cause any complications. If your seroma does not resolve on its own, treatment may include:  Using a needle to drain the fluid from the seroma (needle aspiration).  Inserting a flexible tube (catheter) to drain the fluid.  Applying a bandage (dressing), such as an elastic bandage or binder.  Antibiotic medicines, if the seroma becomes infected. In rare cases, surgery may be done to remove the seroma and repair the area. Follow these instructions at home:  If you were prescribed an antibiotic medicine, take it as told by your health care provider. Do not stop taking  the antibiotic even if you start to feel better.  Return to your normal activities as told by your health care provider. Ask your health care provider what activities are safe for you.  Take over-the-counter and prescription medicines only as told by your health care provider.  Check your seroma every day for signs of infection. Check for:  Redness or pain.  Fluid or pus.  More swelling.  Warmth.  Keep all follow-up visits as told by your health care provider. This is important. Contact a health care provider if:  You have a fever.  You have redness or pain at the site of the seroma.  You have fluid or pus coming from the seroma.  Your seroma is more swollen or is getting bigger.  Your seroma is warm to the touch. This information is not intended to replace advice given to you by your health care provider. Make sure you discuss any questions you have with your health care provider. Document Released: 05/19/2012 Document Revised: 11/04/2015 Document Reviewed: 11/04/2015 Elsevier Interactive Patient Education  2017 [place].  Moderate Conscious Sedation, Adult, Care After These instructions provide you with information about caring for yourself after your procedure. Your health care provider may also give you more specific instructions. Your treatment has been planned according to current medical practices, but problems sometimes occur. Call your health care provider if you have any problems or questions after your procedure. What can I expect after the procedure? After your procedure, it is  common:  To feel sleepy for several hours.  To feel clumsy and have poor balance for several hours.  To have poor judgment for several hours.  To vomit if you eat too soon. Follow these instructions at home: For at least 24 hours after the procedure:   Do not:  Participate in activities where you could fall or become injured.  Drive.  Use heavy machinery.  Drink  alcohol.  Take sleeping pills or medicines that cause drowsiness.  Make important decisions or sign legal documents.  Take care of children on your own.  Rest. Eating and drinking  Follow the diet recommended by your health care provider.  If you vomit:  Drink water, juice, or soup when you can drink without vomiting.  Make sure you have little or no nausea before eating solid foods. General instructions  Have a responsible adult stay with you until you are awake and alert.  Take over-the-counter and prescription medicines only as told by your health care provider.  If you smoke, do not smoke without supervision.  Keep all follow-up visits as told by your health care provider. This is important. Contact a health care provider if:  You keep feeling nauseous or you keep vomiting.  You feel light-headed.  You develop a rash.  You have a fever. Get help right away if:  You have trouble breathing. This information is not intended to replace advice given to you by your health care provider. Make sure you discuss any questions you have with your health care provider. Document Released: 11/12/2012 Document Revised: 06/27/2015 Document Reviewed: 05/14/2015 Elsevier Interactive Patient Education  2017 [place].  Can restart xarelto 2/15 in the AM per Dr. Kathlene Cote

## 2016-03-27 LAB — AEROBIC/ANAEROBIC CULTURE W GRAM STAIN (SURGICAL/DEEP WOUND): Special Requests: NORMAL

## 2016-04-25 DIAGNOSIS — T814XXA Infection following a procedure, initial encounter: Secondary | ICD-10-CM | POA: Diagnosis not present

## 2016-05-01 DIAGNOSIS — Z01419 Encounter for gynecological examination (general) (routine) without abnormal findings: Secondary | ICD-10-CM | POA: Diagnosis not present

## 2016-05-11 DIAGNOSIS — T814XXA Infection following a procedure, initial encounter: Secondary | ICD-10-CM | POA: Diagnosis not present

## 2016-05-14 DIAGNOSIS — I4891 Unspecified atrial fibrillation: Secondary | ICD-10-CM | POA: Diagnosis not present

## 2016-05-14 DIAGNOSIS — Z7901 Long term (current) use of anticoagulants: Secondary | ICD-10-CM | POA: Diagnosis not present

## 2016-05-14 DIAGNOSIS — S199XXA Unspecified injury of neck, initial encounter: Secondary | ICD-10-CM | POA: Diagnosis not present

## 2016-05-14 DIAGNOSIS — S060X0A Concussion without loss of consciousness, initial encounter: Secondary | ICD-10-CM | POA: Diagnosis not present

## 2016-05-14 DIAGNOSIS — S0990XA Unspecified injury of head, initial encounter: Secondary | ICD-10-CM | POA: Diagnosis not present

## 2016-05-25 ENCOUNTER — Telehealth: Payer: Self-pay | Admitting: Cardiovascular Disease

## 2016-05-25 MED ORDER — METOPROLOL TARTRATE 25 MG PO TABS: 12.5000 mg | ORAL_TABLET | Freq: Two times a day (BID) | ORAL | 3 refills | Status: DC

## 2016-05-25 NOTE — Telephone Encounter (Signed)
Pt's Rx was sent to pt's pharmacy as requested. Confirmation received.  °

## 2016-05-25 NOTE — Telephone Encounter (Signed)
New message   Pt needed a 8am appt, and next available with Dr. Acie Fredrickson was 8/14...  *STAT* If patient is at the pharmacy, call can be transferred to refill team.   1. Which medications need to be refilled? (please list name of each medication and dose if known) metoprolol tartrate (LOPRESSOR) 25 MG tablet 2. Which pharmacy/location (including street and city if local pharmacy) is medication to be sent to? [id number] - [place], [place] - [address]  3. Do they need a 30 day or 90 day supply? 90 day supply

## 2016-05-25 NOTE — Addendum Note (Signed)
Addended by: Derl Barrow on: 05/25/2016 01:11 PM   Modules accepted: Orders

## 2016-06-06 ENCOUNTER — Encounter: Payer: Self-pay | Admitting: Rheumatology

## 2016-06-06 ENCOUNTER — Ambulatory Visit (INDEPENDENT_AMBULATORY_CARE_PROVIDER_SITE_OTHER): Payer: 59 | Admitting: Rheumatology

## 2016-06-06 VITALS — BP 125/75 | HR 72 | Resp 14 | Ht 66.0 in | Wt 280.0 lb

## 2016-06-06 DIAGNOSIS — M25562 Pain in left knee: Secondary | ICD-10-CM | POA: Diagnosis not present

## 2016-06-06 DIAGNOSIS — M19042 Primary osteoarthritis, left hand: Secondary | ICD-10-CM

## 2016-06-06 DIAGNOSIS — M0579 Rheumatoid arthritis with rheumatoid factor of multiple sites without organ or systems involvement: Secondary | ICD-10-CM | POA: Diagnosis not present

## 2016-06-06 DIAGNOSIS — I4891 Unspecified atrial fibrillation: Secondary | ICD-10-CM | POA: Diagnosis not present

## 2016-06-06 DIAGNOSIS — M170 Bilateral primary osteoarthritis of knee: Secondary | ICD-10-CM | POA: Diagnosis not present

## 2016-06-06 DIAGNOSIS — G8929 Other chronic pain: Secondary | ICD-10-CM | POA: Diagnosis not present

## 2016-06-06 DIAGNOSIS — M19041 Primary osteoarthritis, right hand: Secondary | ICD-10-CM | POA: Diagnosis not present

## 2016-06-06 DIAGNOSIS — R609 Edema, unspecified: Secondary | ICD-10-CM

## 2016-06-06 DIAGNOSIS — Z79899 Other long term (current) drug therapy: Secondary | ICD-10-CM | POA: Diagnosis not present

## 2016-06-06 DIAGNOSIS — M47816 Spondylosis without myelopathy or radiculopathy, lumbar region: Secondary | ICD-10-CM | POA: Diagnosis not present

## 2016-06-06 DIAGNOSIS — I2699 Other pulmonary embolism without acute cor pulmonale: Secondary | ICD-10-CM | POA: Diagnosis not present

## 2016-06-06 LAB — CBC WITH DIFFERENTIAL/PLATELET
BASOS ABS: 53 {cells}/uL (ref 0–200)
Basophils Relative: 1 %
EOS ABS: 212 {cells}/uL (ref 15–500)
Eosinophils Relative: 4 %
HEMATOCRIT: 35.2 % (ref 35.0–45.0)
HEMOGLOBIN: 10.5 g/dL — AB (ref 11.7–15.5)
Lymphocytes Relative: 26 %
Lymphs Abs: 1378 cells/uL (ref 850–3900)
MCH: 21 pg — ABNORMAL LOW (ref 27.0–33.0)
MCHC: 29.8 g/dL — AB (ref 32.0–36.0)
MCV: 70.5 fL — AB (ref 80.0–100.0)
MONO ABS: 424 {cells}/uL (ref 200–950)
MPV: 10.5 fL (ref 7.5–12.5)
Monocytes Relative: 8 %
NEUTROS ABS: 3233 {cells}/uL (ref 1500–7800)
NEUTROS PCT: 61 %
Platelets: 267 10*3/uL (ref 140–400)
RBC: 4.99 MIL/uL (ref 3.80–5.10)
RDW: 17.4 % — ABNORMAL HIGH (ref 11.0–15.0)
WBC: 5.3 10*3/uL (ref 3.8–10.8)

## 2016-06-06 MED ORDER — LIDOCAINE HCL 1 % IJ SOLN
2.0000 mL | INTRAMUSCULAR | Status: AC | PRN
  Administered 2016-06-06: 2 mL

## 2016-06-06 MED ORDER — TRIAMCINOLONE ACETONIDE 40 MG/ML IJ SUSP
40.0000 mg | INTRAMUSCULAR | Status: AC | PRN
  Administered 2016-06-06: 40 mg via INTRA_ARTICULAR

## 2016-06-06 NOTE — Patient Instructions (Addendum)
  Pt:  [name] DOB;  12-07-1960 New fax number is [phone]; please fax ASAP.    ==================================================== Standing Labs We placed an order today for your standing lab work.    Please come back and get your standing labs today (CBC with differential and CMP with GFR), again in 2 months, then in 3 months   We have open lab Monday through Friday from 8:30-11:30 AM and 1:30-4 PM at the office of Dr. Tresa Moore, PA.   The office is located at [address] [place], [place], [place] [address] No appointment is necessary.   Labs are drawn by [facility].  You may receive a bill from [facility] for your lab work.

## 2016-06-06 NOTE — Progress Notes (Signed)
Office Visit Note  Patient: [name]             Date of Birth: 12/11/1960           MRN: [id number]             PCP: Mateo Flow, MD Referring: Physicians, [place] Visit Date: 06/06/2016 Occupation: @GUAROCC @    Subjective:  Follow-up (Labs for PLQ)   History of Present Illness: [name] is a 56 y.o. female last seen in our office on 10/31/2015. Patient has a history of rheumatoid arthritis with rheumatoid factor positive, CCP and ANA negative.  On 10/31/2015 visit, patient was not taking the Plaquenil because she was trying to establish with an ophthalmologist. She should be taking Plaquenil 200 mg twice a day Monday through Friday.  She also has a history of pulmonary embolism/atrial fibrillation and is taking Xarelto for this.  At the last visit in September, she was having left knee joint pain and it was injected with 40 mg of Kenalog mixed with 1.5 mL of 1% lidocaine.  Stopped PLQ due to surgery Developed e. Coli sepsis and finally feels well enough to restart  PLQ. Last weeks. Was on various antibiotics:  vancomyicin , bactrim, amoxicillin     Activities of Daily Living:  Patient reports morning stiffness for 20 minutes.   Patient Denies nocturnal pain.  Difficulty dressing/grooming: Reports Difficulty climbing stairs: Reports Difficulty getting out of chair: Reports Difficulty using hands for taps, buttons, cutlery, and/or writing: Denies   Review of Systems  Constitutional: Negative for fatigue.  HENT: Negative for mouth sores and mouth dryness.   Eyes: Negative for dryness.  Respiratory: Negative for shortness of breath.   Gastrointestinal: Negative for constipation and diarrhea.  Musculoskeletal: Negative for myalgias and myalgias.  Skin: Negative for sensitivity to sunlight.  Psychiatric/Behavioral: Negative for decreased concentration and sleep disturbance.    PMFS History:  Patient Active Problem List   Diagnosis Date Noted    . Abdominal hematoma   . Post-operative state   . Paroxysmal atrial fibrillation (HCC)   . Controlled type 2 diabetes mellitus with diabetic nephropathy ([place])   . Wound infection 01/25/2016  . Hydronephrosis, left 01/25/2016  . Hydronephrosis, right 01/25/2016  . DVT (deep venous thrombosis) ([place]) 01/25/2016  . Atrial fibrillation ([place]) 01/25/2016  . Diabetes mellitus with complication ([place])   . Wound infection after surgery, initial encounter   . Varicose veins of lower extremities with complications 10/62/6948  . Pain and swelling of left lower leg 10/27/2013  . Pain of left lower extremity 10/27/2013  . Gastroenteritis, acute 09/08/2013  . Fever, unspecified 09/04/2013  . ASD (atrial septal defect) 08/13/2013  . Pulmonary embolism ([place]) 06/22/2013  . Dyspnea 06/11/2013  . Anterior slip repaired Oct 2012 01/18/2011  . GI bleed 12/18/2010  . Diabetes mellitus ([place]) 12/18/2010  . Varicose veins of lower extremities with other complications 54/62/7035  . Diabetes mellitus   . HIP PAIN, RIGHT 03/06/2010    Past Medical History:  Diagnosis Date  . Anemia   . Arthritis   . Atrial fibrillation ([place])   . Bleeding ulcer 12-2010  hospitalized for 1 week  . Blood transfusion   . Blood transfusion without reported diagnosis    2011-2 units transfused, bleeding ulcer  . Bruises easily   . Constipation   . Diabetes mellitus without complication ([place])   . Difficulty in swallowing 03-24-13   no longer a problem  . DVT (deep venous  thrombosis) ([place]) 06-2013   right leg  . GERD (gastroesophageal reflux disease)   . Hyperlipidemia   . Leg swelling 03-24-13   not a problem any longer  . Peripheral vascular disease ([place])   . Pulmonary embolus ([place]) 06-2013  . Reflux   . Shortness of breath   . Vomiting 03-24-13   problem resolved after lap band revision    Family History  Problem Relation Age of Onset  . Heart disease Father   . Kidney disease Father   . Peripheral vascular  disease Father   . Heart attack Father     41  . Diabetes Mother   . Arthritis Mother    Past Surgical History:  Procedure Laterality Date  . APPENDECTOMY    . CARPEL TUNNEL BILATEREAL    . CHOLECYSTECTOMY     5'12  . COLONOSCOPY N/A 04/10/2013   Procedure: COLONOSCOPY;  Surgeon: Beryle Beams, MD;  Location: [facility];  Service: Endoscopy;  Laterality: N/A;  . DIAGNOSTIC LAPAROSCOPY    . ENDOVENOUS ABLATION SAPHENOUS VEIN W/ LASER  06-2008  . ENDOVENOUS ABLATION SAPHENOUS VEIN W/ LASER Left 12-09-2013   EVLA LEFT GREATER SAPHENOUS VEIN BY TODD EARLY MD  . ESOPHAGOGASTRODUODENOSCOPY  12/18/2010   Procedure: ESOPHAGOGASTRODUODENOSCOPY (EGD);  Surgeon: Gatha Mayer, MD;  Location: [facility];  Service: Endoscopy;  Laterality: N/A;  . LAPAROSCOPIC GASTRIC BANDING  01-05-2008  . LAPAROSCOPIC GASTRIC BANDING  12/18/2010   01/04/2009  . SALPHINGOOOPHORECTOMY Right    cyst removal  . TONSILLECTOMY    . WEDGE RESECTION OF OVARY  LEFT OVARY    Social History   Social History Narrative  . No narrative on file     Objective: Vital Signs: BP 125/75 (BP Location: Left Arm, Patient Position: Sitting, Cuff Size: Normal)   Pulse 72   Resp 14   Ht 5\' 6"  (1.676 m)   Wt 280 lb (127 kg)   BMI 45.19 kg/m    Physical Exam  Constitutional: She is oriented to person, place, and time. She appears well-developed and well-nourished.  HENT:  Head: Normocephalic and atraumatic.  Eyes: EOM are normal. Pupils are equal, round, and reactive to light.  Cardiovascular: Normal rate, regular rhythm and normal heart sounds.  Exam reveals no gallop and no friction rub.   No murmur heard. Pulmonary/Chest: Effort normal and breath sounds normal. She has no wheezes. She has no rales.  Abdominal: Soft. Bowel sounds are normal. She exhibits no distension. There is no tenderness. There is no guarding. No hernia.  Musculoskeletal: Normal range of motion. She exhibits no edema, tenderness or  deformity.  Lymphadenopathy:    She has no cervical adenopathy.  Neurological: She is alert and oriented to person, place, and time. Coordination normal.  Skin: Skin is warm and dry. Capillary refill takes less than 2 seconds. No rash noted.  Psychiatric: She has a normal mood and affect. Her behavior is normal.  Nursing note and vitals reviewed.    Musculoskeletal Exam:    CDAI Exam: CDAI Homunculus Exam:   Tenderness:  Right hand: 2nd MCP Left hand: 3rd MCP, 4th MCP and 5th MCP  Swelling:  Right hand: 2nd MCP  Joint Counts:  CDAI Tender Joint count: 4 CDAI Swollen Joint count: 1  Global Assessments:  Patient Global Assessment: 3 Provider Global Assessment: 3  CDAI Calculated Score: 11    Investigation: No additional findings.    Imaging: No results found.  Speciality Comments: No specialty comments available.  Procedures:  Large Joint Inj Date/Time: 06/06/2016 12:16 PM Performed by: Eliezer Lofts Authorized by: Eliezer Lofts   Consent Given by:  Patient Site marked: the procedure site was marked   Timeout: prior to procedure the correct patient, procedure, and site was verified   Indications:  Pain and joint swelling Location:  Knee Site:  L knee Prep: patient was prepped and draped in usual sterile fashion   Needle Size:  27 G Needle Length:  1.5 inches Approach:  Medial Ultrasound Guidance: No   Fluoroscopic Guidance: No   Arthrogram: No   Medications:  40 mg triamcinolone acetonide 40 MG/ML; 2 mL lidocaine 1 % Aspiration Attempted: Yes   Patient tolerance:  Patient tolerated the procedure well with no immediate complications  Left knee pain. Patient received a cortisone injection from Dr. Estanislado Pandy in September 2017 visit and did well. She starting to have left knee pain again. Requesting a cortisone injection today. See procedure note for full details.   Allergies: Ancef [cefazolin sodium]; Contrast media [iodinated diagnostic  agents]; and Penicillins   Assessment / Plan:     Visit Diagnoses: Rheumatoid arthritis with rheumatoid factor of multiple sites without organ or systems involvement ([place]) -  - CCP; - ANA;   High risk medications (not anticoagulants) long-term use - Plan: CBC with Differential/Platelet, COMPLETE METABOLIC PANEL WITH GFR  Primary osteoarthritis of both knees  Primary osteoarthritis of both hands  Spondylosis of lumbar region without myelopathy or radiculopathy  Other pulmonary embolism without acute cor pulmonale, unspecified chronicity (HCC) - On Xarelto  Atrial fibrillation, unspecified type (HCC) - On Xarelto  Peripheral edema   Plan: #1: Rheumatoid arthritis; positive rheumatoid factor; negative ANA, negative CCP Patient is complaining of pain to some of her joints in her hands. Please see homunculus for full details Right second MCP with synovial thickening and mild tenderness. Left third fourth and fifth MCP with tenderness but no synovial thickening or synovitis. Left knee with 30% enlargement compared to the right knee Was off of Plaquenil due to sepsis. Patient was hospitalized. Patient was on multiple rounds of medication.  #2: High risk prescription Was on Plaquenil 200 mg twice a day Monday through Friday but had to discontinue shortly after starting. Restarted Plaquenil about a week ago. Patient has had a baseline Plaquenil eye exam done at [facility] The optometrist did look at the retina and  according to the patient there is no Plaquenil toxicity. Patient will get [facility] optometrist to fill out or Plaquenil eye exam form. Patient also plans to follow with Dr. Gershon Crane eye care for future Plaquenil eye exams.  #3: Labs to be done as follows: Patient need CBC with differential and CMP with GFR today,  around first week of June 2018,  around first week of August 2018.  #4: Left knee pain and swelling. The left knee is about 30% larger in size/diameter  compared to the right knee. Currently she is having significant amount of pain and we will do a cortisone injection which is 2 mL's of 1% lidocaine mixed with 40 mg of Kenalog. Note the patient had successful knee injection on her last visit in September. Patient saw Dr. early, vascular surgeon, and evaluated the left knee swelling and peripheral edema. According to the patient, Dr. early does not feel that her left knee swelling is coming from a vascular problem. He did add that it could be deep veins which he cannot assess. 5 minutes after the knee injected injection, patient states that her  pain to her left knee is 0 on a scale of 0-10.  Patient does plan to see Dr. Katharina Caper, cardiologist for further violation of her peripheral edema.  #5: Return to clinic in 3 months   #6: We will reassess how the patient is doing with her joints at the follow-up visit.  Orders: Orders Placed This Encounter  Procedures  . Large Joint Injection/Arthrocentesis  . CBC with Differential/Platelet  . COMPLETE METABOLIC PANEL WITH GFR   No orders of the defined types were placed in this encounter.   Face-to-face time spent with patient was 30 minutes. 50% of time was spent in counseling and coordination of care.  Follow-Up Instructions: Return in about 3 months (around 09/06/2016).   Eliezer Lofts, PA-C  Note - This record has been created using [employer].  Chart creation errors have been sought, but may not always  have been located. Such creation errors do not reflect on  the standard of medical care.

## 2016-06-07 LAB — COMPLETE METABOLIC PANEL WITH GFR
ALBUMIN: 3.8 g/dL (ref 3.6–5.1)
ALK PHOS: 66 U/L (ref 33–130)
ALT: 11 U/L (ref 6–29)
AST: 18 U/L (ref 10–35)
BILIRUBIN TOTAL: 0.4 mg/dL (ref 0.2–1.2)
BUN: 13 mg/dL (ref 7–25)
CALCIUM: 9.2 mg/dL (ref 8.6–10.4)
CO2: 25 mmol/L (ref 20–31)
Chloride: 106 mmol/L (ref 98–110)
Creat: 0.66 mg/dL (ref 0.50–1.05)
GLUCOSE: 85 mg/dL (ref 65–99)
Potassium: 4.9 mmol/L (ref 3.5–5.3)
Sodium: 143 mmol/L (ref 135–146)
TOTAL PROTEIN: 6.6 g/dL (ref 6.1–8.1)

## 2016-06-18 DIAGNOSIS — K5900 Constipation, unspecified: Secondary | ICD-10-CM | POA: Diagnosis not present

## 2016-06-18 DIAGNOSIS — I2699 Other pulmonary embolism without acute cor pulmonale: Secondary | ICD-10-CM | POA: Diagnosis not present

## 2016-06-18 DIAGNOSIS — Z1211 Encounter for screening for malignant neoplasm of colon: Secondary | ICD-10-CM | POA: Diagnosis not present

## 2016-06-26 ENCOUNTER — Ambulatory Visit (INDEPENDENT_AMBULATORY_CARE_PROVIDER_SITE_OTHER): Payer: 59 | Admitting: Orthopaedic Surgery

## 2016-06-26 ENCOUNTER — Ambulatory Visit (INDEPENDENT_AMBULATORY_CARE_PROVIDER_SITE_OTHER): Payer: 59

## 2016-06-26 DIAGNOSIS — G8929 Other chronic pain: Secondary | ICD-10-CM | POA: Diagnosis not present

## 2016-06-26 DIAGNOSIS — M25562 Pain in left knee: Secondary | ICD-10-CM

## 2016-06-26 NOTE — Progress Notes (Signed)
Office Visit Note   Patient: [name]           Date of Birth: 02-04-1961           MRN: [id number] Visit Date: 06/26/2016              Requested by: Physicians, [facility] [address] [place], [place] [address] PCP: Mateo Flow, MD   Assessment & Plan: Visit Diagnoses:  1. Chronic pain of left knee     Plan: At this point she is tried and failed all forms conservative treatment. We are recommending a knee replacement. She is going to see her cardiologist for clearance. She's been owns relative for atrial fibrillation that she had the past which was only one episode but she did have a big pulmonary embolus before. She knows that she would need to stop his relative 3 days prior to surgery. She likely does not need to be bridged with any blood thinners. She like to have the surgery done in July. She'll see cardiologist before that. With a long and thorough discussion about knee replacement surgery. All questions were encouraged and answered. We talked about the risk of acute blood loss anemia, nerve and vessel injury, fracture, infection, DVT. We talked about the goals are decreased pain, improve mobility, and overall improved quality of life. We showed her her x-rays and with the knee model showing her a knee replacement involves well.  Follow-Up Instructions: Return for 2 weeks post-op.   Orders:  Orders Placed This Encounter  Procedures  . XR Knee 1-2 Views Left   No orders of the defined types were placed in this encounter.     Procedures: No procedures performed   Clinical Data: No additional findings.   Subjective: No chief complaint on file. The patient is a very pleasant 56 year old patient who I know well. 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 Her left knee has been bothering her for several years now slowly getting worse. It is definitely detrimentally affecting her activities daily living, her quality of life, and her mobility.  Her pain is 10 out of 10. She has tried and failed all forms conservative treatment. It is affecting her exercise ability. She's had steroid injections and that has not helped. Ordering x-rays of her knee today as well. She's work on weight loss but is difficult due to her pain.  HPI  Review of Systems She denies any headache, chest pain, short of breath, fever, chills, nausea, vomiting.  Objective: Vital Signs: There were no vitals taken for this visit.  Physical Exam She is alert and oriented 3 in no acute distress Ortho Exam Examination of her left knee shows a significant valgus deformity that is correctable. She has significant patellofemoral crepitation and lateral joint line tenderness. The knee feels ligamentously stable but is deathly very painful throughout its arc of motion. Specialty Comments:  No specialty comments available.  Imaging: Xr Knee 1-2 Views Left  Result Date: 06/26/2016 2 views of the left knee AP and lateral show severe osteoarthritis involving mainly the lateral compartment and the patellofemoral joint. There is complete loss of lateral compartment. There is a valgus malalignment. There is a mild effusion. There is a trigger ossifies throughout the knee and significant patellofemoral disease as well.    PMFS History: Patient Active Problem List   Diagnosis Date Noted  . Abdominal hematoma   . Post-operative state   . Paroxysmal atrial fibrillation (HCC)   .  Controlled type 2 diabetes mellitus with diabetic nephropathy ([place])   . Wound infection 01/25/2016  . Hydronephrosis, left 01/25/2016  . Hydronephrosis, right 01/25/2016  . DVT (deep venous thrombosis) ([place]) 01/25/2016  . Atrial fibrillation ([place]) 01/25/2016  . Diabetes mellitus with complication ([place])   . Wound infection after surgery, initial encounter   . Varicose veins of lower extremities with complications 70/02/7492  . Pain and swelling of left lower leg 10/27/2013  . Pain of left lower  extremity 10/27/2013  . Gastroenteritis, acute 09/08/2013  . Fever, unspecified 09/04/2013  . ASD (atrial septal defect) 08/13/2013  . Pulmonary embolism ([place]) 06/22/2013  . Dyspnea 06/11/2013  . Anterior slip repaired Oct 2012 01/18/2011  . GI bleed 12/18/2010  . Diabetes mellitus ([place]) 12/18/2010  . Varicose veins of lower extremities with other complications 49/67/5916  . Diabetes mellitus   . HIP PAIN, RIGHT 03/06/2010   Past Medical History:  Diagnosis Date  . Anemia   . Arthritis   . Atrial fibrillation ([place])   . Bleeding ulcer 12-2010  hospitalized for 1 week  . Blood transfusion   . Blood transfusion without reported diagnosis    2011-2 units transfused, bleeding ulcer  . Bruises easily   . Constipation   . Diabetes mellitus without complication ([place])   . Difficulty in swallowing 03-24-13   no longer a problem  . DVT (deep venous thrombosis) ([place]) 06-2013   right leg  . GERD (gastroesophageal reflux disease)   . Hyperlipidemia   . Leg swelling 03-24-13   not a problem any longer  . Peripheral vascular disease ([place])   . Pulmonary embolus ([place]) 06-2013  . Reflux   . Shortness of breath   . Vomiting 03-24-13   problem resolved after lap band revision    Family History  Problem Relation Age of Onset  . Heart disease Father   . Kidney disease Father   . Peripheral vascular disease Father   . Heart attack Father        2  . Diabetes Mother   . Arthritis Mother     Past Surgical History:  Procedure Laterality Date  . APPENDECTOMY    . CARPEL TUNNEL BILATEREAL    . CHOLECYSTECTOMY     5'12  . COLONOSCOPY N/A 04/10/2013   Procedure: COLONOSCOPY;  Surgeon: Beryle Beams, MD;  Location: [facility];  Service: Endoscopy;  Laterality: N/A;  . DIAGNOSTIC LAPAROSCOPY    . ENDOVENOUS ABLATION SAPHENOUS VEIN W/ LASER  06-2008  . ENDOVENOUS ABLATION SAPHENOUS VEIN W/ LASER Left 12-09-2013   EVLA LEFT GREATER SAPHENOUS VEIN BY TODD EARLY MD  .  ESOPHAGOGASTRODUODENOSCOPY  12/18/2010   Procedure: ESOPHAGOGASTRODUODENOSCOPY (EGD);  Surgeon: Gatha Mayer, MD;  Location: [facility];  Service: Endoscopy;  Laterality: N/A;  . LAPAROSCOPIC GASTRIC BANDING  01-05-2008  . LAPAROSCOPIC GASTRIC BANDING  12/18/2010   01/04/2009  . SALPHINGOOOPHORECTOMY Right    cyst removal  . TONSILLECTOMY    . WEDGE RESECTION OF OVARY  LEFT OVARY    Social History   Occupational History  . RN [employer]   Social History Main Topics  . Smoking status: Never Smoker  . Smokeless tobacco: Never Used  . Alcohol use No  . Drug use: No  . Sexual activity: Yes    Partners: Male    Birth control/ protection: None     Comment: declined condoms

## 2016-07-05 DIAGNOSIS — Z1211 Encounter for screening for malignant neoplasm of colon: Secondary | ICD-10-CM | POA: Diagnosis not present

## 2016-07-05 DIAGNOSIS — K5730 Diverticulosis of large intestine without perforation or abscess without bleeding: Secondary | ICD-10-CM | POA: Diagnosis not present

## 2016-07-10 ENCOUNTER — Ambulatory Visit (INDEPENDENT_AMBULATORY_CARE_PROVIDER_SITE_OTHER): Payer: 59 | Admitting: Cardiovascular Disease

## 2016-07-10 ENCOUNTER — Encounter: Payer: Self-pay | Admitting: Cardiovascular Disease

## 2016-07-10 VITALS — BP 128/78 | HR 74 | Ht 66.0 in | Wt 287.2 lb

## 2016-07-10 DIAGNOSIS — I4891 Unspecified atrial fibrillation: Secondary | ICD-10-CM | POA: Diagnosis not present

## 2016-07-10 DIAGNOSIS — R600 Localized edema: Secondary | ICD-10-CM

## 2016-07-10 DIAGNOSIS — I480 Paroxysmal atrial fibrillation: Secondary | ICD-10-CM | POA: Diagnosis not present

## 2016-07-10 NOTE — Patient Instructions (Signed)
Medication Instructions:  Your physician recommends that you continue on your current medications as directed. Please refer to the Current Medication list given to you today.   Labwork: None Ordered   Testing/Procedures: None Ordered   Follow-Up: Your physician wants you to follow-up in: 1 year with Dr. Nahser.  You will receive a reminder letter in the mail two months in advance. If you don't receive a letter, please call our office to schedule the follow-up appointment.   If you need a refill on your cardiac medications before your next appointment, please call your pharmacy.   Thank you for choosing [facility]! Xadrian Craighead, RN [phone]    

## 2016-07-10 NOTE — Progress Notes (Signed)
[name] Date of Birth:  [birth year] [name] [address] [place], [place]  [address] [phone]        Fax   [phone]  Problem list 1. Pulmonary embolus 2. Atrial fibrillation 3.  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  The patient has an interesting cardiopulmonary history.  She was in her usual state of health until late April 2015 when she began to have pulmonary congestion symptoms and thought that she had the flu.  She saw her PCP in [place] several times initially her oxygen saturation at the first visit was 96% and the second visit was 92%.  She continued to worsen and collapsed at home.  She was brought to [facility] where in the emergency room her d-dimer was 13 and she underwent a CT angiogram which showed massive pulmonary embolus with saddle embolus.  She had slight elevation of her troponins consistent with pulmonary embolus.  Her initial echocardiogram on 06/11/13 showed a moderate hypertension with a pulmonary artery pressure of 41.  The ventricular septum showed paradoxic motion consistent with right ventricular volume overload.  There was a probable medium sized ostium secundum ASD present.  The right atrium was markedly dilated.  The patient went on to be treated with transcatheter ultrasound assisted thrombolytic infusion therapy for her saddle embolus.  She had a good clinical response.  She had a followup echocardiogram on 06/22/13 which showed that her pulmonary artery pressure had returned to normal at 16.  The right atrial size was normal.  The right ventricular size was normal.  There was no evidence of ASD or PFO. The patient was evaluated for hypercoagulable state as the cause of her pulmonary embolus and her DVT of her right leg.  However the hypercoagulability workup was negative.  The patient does not have any family history of abnormal blood clotting. The patient herself has no prior cardiac history.   She does have a past history of morbid obesity and has had lap band surgery by Dr. Johnathan Hausen.  The patient initially weighed about 388 pounds.  Since her surgery she has lost approximately 130 pounds. She still is experiencing mild exertional dyspnea.  She denies any chest discomfort.  She has not been aware of any racing of her heart.  May 06, 2015 Was recently found have atrial fibrillation. She's been having some increased shortness of breath and fatigue. She came to the [facility] emergency room and was found have atrial fibrillation at rate of 154. Was treated with Diltiazem and metoprolol and she feels much better.   Thinks that she has converted .  July 10, 2016:  [name] is here for pre- op clearance for left knee replacement  Has not any noticeable episodes of Afib .  On Xarelto .    Current Outpatient Prescriptions  Medication Sig Dispense Refill  . acetaminophen (TYLENOL) 500 MG tablet Take 500 mg by mouth as needed for mild pain.     . Ascorbic Acid (VITAMIN C PO) Take 1 tablet by mouth daily.    . Biotin 10000 MCG TABS Take by mouth daily.    . celecoxib (CELEBREX) 200 MG capsule Take 200 mg by mouth as needed for mild pain.     . Cholecalciferol (VITAMIN D HIGH POTENCY) 1000 units capsule Take 1,000 Units by mouth daily.    . hydroxychloroquine (PLAQUENIL) 200 MG tablet Take 200 mg by mouth 2 (two) times daily.    Marland Kitchen  metoprolol tartrate (LOPRESSOR) 25 MG tablet Take 0.5 tablets (12.5 mg total) by mouth 2 (two) times daily. 30 tablet 3  . Multiple Vitamins-Minerals (MULTIVITAMINS THER. W/MINERALS) TABS Take 1 tablet by mouth daily.      Marland Kitchen omeprazole (PRILOSEC) 20 MG capsule Take 20 mg by mouth 2 (two) times daily before a meal.    . rivaroxaban (XARELTO) 20 MG TABS tablet Take 20 mg by mouth daily with supper.     No current facility-administered medications for this visit.     Allergies  Allergen Reactions  . Ancef [Cefazolin Sodium] Nausea And Vomiting  . Contrast  Media [Iodinated Diagnostic Agents]     apnea  . Penicillins Other (See Comments)    Itchy rash as a kid, it happened when she was a child Has patient had a PCN reaction causing immediate rash, facial/tongue/throat swelling, SOB or lightheadedness with hypotension: YES Has patient had a PCN reaction causing severe rash involving mucus membranes or skin necrosis: NO Has patient had a PCN reaction that required hospitalization NO Has patient had a PCN reaction occurring within the last 10 years: NO If all of the above answers are "NO", then may proceed with Cephalosporin use.    Patient Active Problem List   Diagnosis Date Noted  . Abdominal hematoma   . Post-operative state   . Paroxysmal atrial fibrillation (HCC)   . Controlled type 2 diabetes mellitus with diabetic nephropathy ([place])   . Wound infection 01/25/2016  . Hydronephrosis, left 01/25/2016  . Hydronephrosis, right 01/25/2016  . DVT (deep venous thrombosis) ([place]) 01/25/2016  . Atrial fibrillation ([place]) 01/25/2016  . Diabetes mellitus with complication ([place])   . Wound infection after surgery, initial encounter   . Varicose veins of lower extremities with complications 83/66/2947  . Pain and swelling of left lower leg 10/27/2013  . Pain of left lower extremity 10/27/2013  . Gastroenteritis, acute 09/08/2013  . Fever, unspecified 09/04/2013  . ASD (atrial septal defect) 08/13/2013  . Pulmonary embolism ([place]) 06/22/2013  . Dyspnea 06/11/2013  . Anterior slip repaired Oct 2012 01/18/2011  . GI bleed 12/18/2010  . Diabetes mellitus ([place]) 12/18/2010  . Varicose veins of lower extremities with other complications 65/46/5035  . Diabetes mellitus   . HIP PAIN, RIGHT 03/06/2010    History  Smoking Status  . Never Smoker  Smokeless Tobacco  . Never Used    History  Alcohol Use No    Family History  Problem Relation Age of Onset  . Heart disease Father   . Kidney disease Father   . Peripheral vascular disease Father     . Heart attack Father        77  . Diabetes Mother   . Arthritis Mother     Review of Systems: Constitutional: no fever chills diaphoresis or fatigue or change in weight.  Head and neck: no hearing loss, no epistaxis, no photophobia or visual disturbance. Respiratory: No cough, shortness of breath or wheezing. Cardiovascular: No chest pain peripheral edema, palpitations. Gastrointestinal: No abdominal distention, no abdominal pain, no change in bowel habits hematochezia or melena. Genitourinary: No dysuria, no frequency, no urgency, no nocturia. Musculoskeletal:No arthralgias, no back pain, no gait disturbance or myalgias. Neurological: No dizziness, no headaches, no numbness, no seizures, no syncope, no weakness, no tremors. Hematologic: No lymphadenopathy, no easy bruising. Psychiatric: No confusion, no hallucinations, no sleep disturbance.    Physical Exam: Vitals:   07/10/16 1401  BP: 128/78  Pulse: 74  General:   Obese middle age female  nad  Head and neck exam :   No JVD, neck is supple .  Chest :  Clear ,.  Heart:RR,  No murmur s  abdomen:  Obese, non tender, + BS   Extremities:    2+ bilateral leg edema   Neuro:  nonfocal   Integument reveals no rash  EKG :  Ordered today  - NSR at 74,   Inc. RBBB   Assessment / Plan: 1. Paroxysmal atrial fib:   Has converted to NSR on Dilt drip . Rate is well controlled on metoprolol .  2. history of massive pulmonary embolus with saddle embolus in May 2015 - continue Xarelto.  She will probably need to hold the Xarelto for 2 days prior to her knee surgery. I anticipate that a she'll  be restarting her on Xarelto shortly after her knee surgery to prevent DVT.  It should be noted that she will need to take the full strength Xarelto 20 mg instead of the DVT prophylaxis ( 10 mg)  dose following her knee surgery. I would recommend that she be started back on this dose as soon as she is stable from a surgical  standpoint.  3. history of DVT of right lower extremity preceding her pulmonary embolus She has bilateral leg edema. I've recommended that she get a lounge doctor leg rest which is available on [email or web address]  5. morbid obesity Encouraged weight loss.    Mertie Moores, MD  07/10/2016 2:05 PM    [place],  [place], [place]  [address] Pager [phone] Phone: [phone]; Fax: [phone]

## 2016-07-18 ENCOUNTER — Other Ambulatory Visit (INDEPENDENT_AMBULATORY_CARE_PROVIDER_SITE_OTHER): Payer: Self-pay | Admitting: Physician Assistant

## 2016-07-24 ENCOUNTER — Encounter (HOSPITAL_COMMUNITY): Payer: Self-pay

## 2016-07-24 NOTE — Pre-Procedure Instructions (Signed)
Herberta Pickron Kantner  07/24/2016      [id number] - [place], [place] - [address] [place] [address] Phone: [phone] Fax: [phone]    Your procedure is scheduled on June 26  Report to [place] at 130  Call this number if you have problems the morning of surgery:  [phone]   Remember:  Do not eat food or drink liquids after midnight.  Take these medicines the morning of surgery with A SIP OF WATER Metoprolol tartate (Lopressor), omeprazole (Prilosec)  Stop taking Xarelto and Plaquenil  as directed by your Dr.  Stop taking BC's, Goody's, Herbal mediations, Vitamins, Ibuprofen, Advil, Motrin, Aleve   Do not wear jewelry, make-up or nail polish.  Do not wear lotions, powders, or perfumes, or deoderant.  Do not shave 48 hours prior to surgery.  Men may shave face and neck.  Do not bring valuables to the hospital.  [facility] is not responsible for any belongings or valuables.  Contacts, dentures or bridgework may not be worn into surgery.  Leave your suitcase in the car.  After surgery it may be brought to your room.  For patients admitted to the hospital, discharge time will be determined by your treatment team.  Patients discharged the day of surgery will not be allowed to drive home.   Special instructions: [facility] - Preparing for Surgery  Before surgery, you can play an important role.  Because skin is not sterile, your skin needs to be as free of germs as possible.  You can reduce the number of germs on you skin by washing with CHG (chlorahexidine gluconate) soap before surgery.  CHG is an antiseptic cleaner which kills germs and bonds with the skin to continue killing germs even after washing.  Please DO NOT use if you have an allergy to CHG or antibacterial soaps.  If your skin becomes reddened/irritated stop using the CHG and inform your nurse when you arrive at Short Stay.  Do not shave (including  legs and underarms) for at least 48 hours prior to the first CHG shower.  You may shave your face.  Please follow these instructions carefully:   1.  Shower with CHG Soap the night before surgery and the  morning of Surgery.  2.  If you choose to wash your hair, wash your hair first as usual with your  normal shampoo.  3.  After you shampoo, rinse your hair and body thoroughly to remove the Shampoo.  4.  Use CHG as you would any other liquid soap.  You can apply chg directly to the skin and wash gently with scrungie or a clean washcloth.  5.  Apply the CHG Soap to your body ONLY FROM THE NECK DOWN.    Do not use on open wounds or open sores.  Avoid contact with your eyes,  ears, mouth and genitals (private parts).  Wash genitals (private parts)   with your normal soap.  6.  Wash thoroughly, paying special attention to the area where your surgery  will be performed.  7.  Thoroughly rinse your body with warm water from the neck down.  8.  DO NOT shower/wash with your normal soap after using and rinsing off   the CHG Soap.  9.  Pat yourself dry with a clean towel.            10.  Wear clean pajamas.  11.  Place clean sheets on your bed the night of your first shower and do not   sleep with pets.  Day of Surgery  Do not apply any lotions/deoderants the morning of surgery.  Please wear clean clothes to the hospital/surgery center.     Please read over the following fact sheets that you were given. Pain Booklet, Coughing and Deep Breathing, MRSA Information and Surgical Site Infection Prevention

## 2016-07-25 ENCOUNTER — Encounter (HOSPITAL_COMMUNITY)
Admission: RE | Admit: 2016-07-25 | Discharge: 2016-07-25 | Disposition: A | Discharge status: Home / Self Care | Payer: 59 | Source: Ambulatory Visit | Attending: Orthopaedic Surgery | Admitting: Orthopaedic Surgery

## 2016-07-25 ENCOUNTER — Encounter (HOSPITAL_COMMUNITY): Payer: Self-pay

## 2016-07-25 DIAGNOSIS — K219 Gastro-esophageal reflux disease without esophagitis: Secondary | ICD-10-CM | POA: Diagnosis not present

## 2016-07-25 DIAGNOSIS — I509 Heart failure, unspecified: Secondary | ICD-10-CM | POA: Diagnosis not present

## 2016-07-25 DIAGNOSIS — Z86718 Personal history of other venous thrombosis and embolism: Secondary | ICD-10-CM | POA: Insufficient documentation

## 2016-07-25 DIAGNOSIS — D649 Anemia, unspecified: Secondary | ICD-10-CM | POA: Diagnosis not present

## 2016-07-25 DIAGNOSIS — Z01812 Encounter for preprocedural laboratory examination: Secondary | ICD-10-CM | POA: Diagnosis not present

## 2016-07-25 DIAGNOSIS — I4891 Unspecified atrial fibrillation: Secondary | ICD-10-CM | POA: Insufficient documentation

## 2016-07-25 HISTORY — DX: Cardiac arrhythmia, unspecified: I49.9

## 2016-07-25 HISTORY — DX: Heart failure, unspecified: I50.9

## 2016-07-25 LAB — BASIC METABOLIC PANEL
Anion gap: 5 (ref 5–15)
BUN: 16 mg/dL (ref 6–20)
CALCIUM: 9.2 mg/dL (ref 8.9–10.3)
CO2: 28 mmol/L (ref 22–32)
CREATININE: 0.64 mg/dL (ref 0.44–1.00)
Chloride: 109 mmol/L (ref 101–111)
GFR calc Af Amer: >60 mL/min (ref >60)
GLUCOSE: 96 mg/dL (ref 65–99)
Potassium: 4.4 mmol/L (ref 3.5–5.1)
Sodium: 142 mmol/L (ref 135–145)

## 2016-07-25 LAB — CBC
HEMATOCRIT: 35 % — AB (ref 36.0–46.0)
HEMOGLOBIN: 10 g/dL — AB (ref 12.0–15.0)
MCH: 20.8 pg — ABNORMAL LOW (ref 26.0–34.0)
MCHC: 28.6 g/dL — ABNORMAL LOW (ref 30.0–36.0)
MCV: 72.9 fL — ABNORMAL LOW (ref 78.0–100.0)
PLATELETS: 208 10*3/uL (ref 150–400)
RBC: 4.8 MIL/uL (ref 3.87–5.11)
RDW: 18.3 % — ABNORMAL HIGH (ref 11.5–15.5)
WBC: 5.4 10*3/uL (ref 4.0–10.5)

## 2016-07-25 LAB — SURGICAL PCR SCREEN
MRSA, PCR: NEGATIVE
STAPHYLOCOCCUS AUREUS: NEGATIVE

## 2016-07-25 LAB — PROTIME-INR
INR: 1.13
Prothrombin Time: 14.6 seconds (ref 11.4–15.2)

## 2016-07-25 NOTE — Progress Notes (Signed)
PCP is Dr. Bertram Millard Cardiologist is Dr Acie Fredrickson- saw last July 10, 2016 Denies ever having a card cath or stress test Echo noted from 2015  Denies any chest pain, Fever, or cough. Reports she does not have DM. Reports she had an A1c in Jan 2018 3.0 Note form Dr Acie Fredrickson reads to stop Xarelto 2 days prior to surgery. Pt states last dose of Plaqunil will be Fri 07-27-16

## 2016-07-26 NOTE — Progress Notes (Addendum)
Anesthesia Chart Review:  Pt is a 56 year old female scheduled for L total knee arthroplasty on 07/31/2016 with Jean Rosenthal, MD  - PCP is Bertram Millard, MD - Cardiologist is Mertie Moores, MD who cleared pt for surgery at last office visit 07/10/16.   PMH includes:  Atrial fibrillation, CHF, PE (massive with saddle embolus requiring transcatheter ultrasound assisted thrombolytic infusion therapy 2015), DVT, PVD, anemia, bleeding ulcer (2012), GERD.  Never smoker. BMI 45.5  Medications include: plaquenil, metoprolol, prilosec, xarelto. Pt to stop xarelto 2 days before surgery.  - I reached out to Dr. Acie Fredrickson to ask about stopping xarelto 3 days before surgery but was unsuccessful in getting in touch with him.    Preoperative labs reviewed.  H/H 10.0/35.0; this is consistent with prior results. Dr. Ninfa Linden is aware.   CXR 01/25/16: No active cardiopulmonary disease.  EKG 07/10/16: NSR. Incomplete RBBB. Possible anterolateral infarct, age undetermined.   Echo 06/22/13:  - Left ventricle: The cavity size was normal. Systolic function was normal. Wall motion was normal; there were no regional wallmotion abnormalities. - Atrial septum: No defect or patent foramen ovale was identified.  If no changes, I anticipate pt can proceed with surgery as scheduled.   Willeen Cass, FNP-BC [facility]/Anesthesiology Phone: [phone] 07/27/2016 4:24 PM

## 2016-07-30 ENCOUNTER — Other Ambulatory Visit (INDEPENDENT_AMBULATORY_CARE_PROVIDER_SITE_OTHER): Payer: Self-pay | Admitting: Orthopaedic Surgery

## 2016-07-30 MED ORDER — CLINDAMYCIN PHOSPHATE 900 MG/50ML IV SOLN
900.0000 mg | INTRAVENOUS | Status: DC
  Filled 2016-07-30: qty 50

## 2016-07-31 ENCOUNTER — Encounter (HOSPITAL_COMMUNITY)
Admission: RE | Disposition: A | Discharge status: Home Health | Payer: Self-pay | Source: Ambulatory Visit | Attending: Orthopaedic Surgery

## 2016-07-31 ENCOUNTER — Encounter (HOSPITAL_COMMUNITY): Payer: Self-pay | Admitting: *Deleted

## 2016-07-31 ENCOUNTER — Inpatient Hospital Stay (HOSPITAL_COMMUNITY): Payer: 59 | Admitting: Emergency Medicine

## 2016-07-31 ENCOUNTER — Inpatient Hospital Stay (HOSPITAL_COMMUNITY)
Admission: RE | Admit: 2016-07-31 | Discharge: 2016-08-03 | DRG: 470 | Disposition: A | Discharge status: Home Health | Payer: 59 | Source: Ambulatory Visit | Attending: Orthopaedic Surgery | Admitting: Orthopaedic Surgery

## 2016-07-31 ENCOUNTER — Inpatient Hospital Stay (HOSPITAL_COMMUNITY): Payer: 59 | Admitting: Anesthesiology

## 2016-07-31 ENCOUNTER — Inpatient Hospital Stay (HOSPITAL_COMMUNITY): Payer: 59

## 2016-07-31 DIAGNOSIS — Z86711 Personal history of pulmonary embolism: Secondary | ICD-10-CM

## 2016-07-31 DIAGNOSIS — Z880 Allergy status to penicillin: Secondary | ICD-10-CM

## 2016-07-31 DIAGNOSIS — Z9079 Acquired absence of other genital organ(s): Secondary | ICD-10-CM

## 2016-07-31 DIAGNOSIS — Z841 Family history of disorders of kidney and ureter: Secondary | ICD-10-CM

## 2016-07-31 DIAGNOSIS — E1121 Type 2 diabetes mellitus with diabetic nephropathy: Secondary | ICD-10-CM | POA: Diagnosis present

## 2016-07-31 DIAGNOSIS — M21062 Valgus deformity, not elsewhere classified, left knee: Secondary | ICD-10-CM | POA: Diagnosis present

## 2016-07-31 DIAGNOSIS — Z90722 Acquired absence of ovaries, bilateral: Secondary | ICD-10-CM | POA: Diagnosis not present

## 2016-07-31 DIAGNOSIS — Z96652 Presence of left artificial knee joint: Secondary | ICD-10-CM

## 2016-07-31 DIAGNOSIS — M1712 Unilateral primary osteoarthritis, left knee: Secondary | ICD-10-CM | POA: Diagnosis not present

## 2016-07-31 DIAGNOSIS — E1151 Type 2 diabetes mellitus with diabetic peripheral angiopathy without gangrene: Secondary | ICD-10-CM | POA: Diagnosis present

## 2016-07-31 DIAGNOSIS — Z471 Aftercare following joint replacement surgery: Secondary | ICD-10-CM | POA: Diagnosis not present

## 2016-07-31 DIAGNOSIS — Z8261 Family history of arthritis: Secondary | ICD-10-CM

## 2016-07-31 DIAGNOSIS — K219 Gastro-esophageal reflux disease without esophagitis: Secondary | ICD-10-CM | POA: Diagnosis present

## 2016-07-31 DIAGNOSIS — Z9049 Acquired absence of other specified parts of digestive tract: Secondary | ICD-10-CM | POA: Diagnosis not present

## 2016-07-31 DIAGNOSIS — Z881 Allergy status to other antibiotic agents status: Secondary | ICD-10-CM

## 2016-07-31 DIAGNOSIS — G8918 Other acute postprocedural pain: Secondary | ICD-10-CM | POA: Diagnosis not present

## 2016-07-31 DIAGNOSIS — I480 Paroxysmal atrial fibrillation: Secondary | ICD-10-CM | POA: Diagnosis present

## 2016-07-31 DIAGNOSIS — Z8249 Family history of ischemic heart disease and other diseases of the circulatory system: Secondary | ICD-10-CM

## 2016-07-31 DIAGNOSIS — E785 Hyperlipidemia, unspecified: Secondary | ICD-10-CM | POA: Diagnosis present

## 2016-07-31 DIAGNOSIS — Z9884 Bariatric surgery status: Secondary | ICD-10-CM | POA: Diagnosis not present

## 2016-07-31 DIAGNOSIS — Z7901 Long term (current) use of anticoagulants: Secondary | ICD-10-CM | POA: Diagnosis not present

## 2016-07-31 DIAGNOSIS — Z91041 Radiographic dye allergy status: Secondary | ICD-10-CM | POA: Diagnosis not present

## 2016-07-31 DIAGNOSIS — M25762 Osteophyte, left knee: Secondary | ICD-10-CM | POA: Diagnosis present

## 2016-07-31 DIAGNOSIS — Z86718 Personal history of other venous thrombosis and embolism: Secondary | ICD-10-CM | POA: Diagnosis not present

## 2016-07-31 DIAGNOSIS — D62 Acute posthemorrhagic anemia: Secondary | ICD-10-CM | POA: Diagnosis not present

## 2016-07-31 DIAGNOSIS — Z79899 Other long term (current) drug therapy: Secondary | ICD-10-CM

## 2016-07-31 DIAGNOSIS — M25562 Pain in left knee: Secondary | ICD-10-CM | POA: Diagnosis not present

## 2016-07-31 DIAGNOSIS — Z833 Family history of diabetes mellitus: Secondary | ICD-10-CM

## 2016-07-31 HISTORY — PX: TOTAL KNEE ARTHROPLASTY: SHX125

## 2016-07-31 SURGERY — ARTHROPLASTY, KNEE, TOTAL
Anesthesia: Spinal | Site: Knee | Laterality: Left

## 2016-07-31 MED ORDER — RIVAROXABAN 20 MG PO TABS
20.0000 mg | ORAL_TABLET | Freq: Every day | ORAL | Status: DC
  Administered 2016-08-01 – 2016-08-03 (×3): 20 mg via ORAL
  Filled 2016-07-31 (×3): qty 1

## 2016-07-31 MED ORDER — CHLORHEXIDINE GLUCONATE 4 % EX LIQD: 60.0000 mL | Freq: Once | CUTANEOUS | Status: DC

## 2016-07-31 MED ORDER — ACETAMINOPHEN 650 MG RE SUPP: 650.0000 mg | Freq: Four times a day (QID) | RECTAL | Status: DC | PRN

## 2016-07-31 MED ORDER — CHOLECALCIFEROL 25 MCG (1000 UT) PO CAPS: 5000.0000 [IU] | ORAL_CAPSULE | Freq: Every day | ORAL | Status: DC

## 2016-07-31 MED ORDER — OXYCODONE HCL 5 MG/5ML PO SOLN
5.0000 mg | Freq: Once | ORAL | Status: DC | PRN
Start: 2016-07-31 — End: 2016-07-31

## 2016-07-31 MED ORDER — BUPIVACAINE IN DEXTROSE 0.75-8.25 % IT SOLN
INTRATHECAL | Status: DC | PRN
  Administered 2016-07-31: 1.6 mL via INTRATHECAL

## 2016-07-31 MED ORDER — POLYETHYLENE GLYCOL 3350 17 G PO PACK: 17.0000 g | PACK | Freq: Every day | ORAL | Status: DC | PRN

## 2016-07-31 MED ORDER — PROPOFOL 10 MG/ML IV BOLUS
INTRAVENOUS | Status: DC | PRN
  Administered 2016-07-31: 30 mg via INTRAVENOUS

## 2016-07-31 MED ORDER — PROPOFOL 10 MG/ML IV BOLUS
INTRAVENOUS | Status: AC
  Filled 2016-07-31: qty 20

## 2016-07-31 MED ORDER — METOPROLOL TARTRATE 12.5 MG HALF TABLET
12.5000 mg | ORAL_TABLET | Freq: Two times a day (BID) | ORAL | Status: DC
  Administered 2016-08-01 – 2016-08-03 (×4): 12.5 mg via ORAL
  Filled 2016-07-31 (×5): qty 1

## 2016-07-31 MED ORDER — ALUM & MAG HYDROXIDE-SIMETH 200-200-20 MG/5ML PO SUSP: 30.0000 mL | ORAL | Status: DC | PRN

## 2016-07-31 MED ORDER — METOCLOPRAMIDE HCL 5 MG PO TABS: 5.0000 mg | ORAL_TABLET | Freq: Three times a day (TID) | ORAL | Status: DC | PRN

## 2016-07-31 MED ORDER — PHENOL 1.4 % MT LIQD: 1.0000 | OROMUCOSAL | Status: DC | PRN

## 2016-07-31 MED ORDER — LACTATED RINGERS IV SOLN
INTRAVENOUS | Status: DC
  Administered 2016-07-31 (×2): via INTRAVENOUS

## 2016-07-31 MED ORDER — ROPIVACAINE HCL 5 MG/ML IJ SOLN
INTRAMUSCULAR | Status: DC | PRN
  Administered 2016-07-31: 30 mL via PERINEURAL

## 2016-07-31 MED ORDER — DOCUSATE SODIUM 100 MG PO CAPS
100.0000 mg | ORAL_CAPSULE | Freq: Two times a day (BID) | ORAL | Status: DC
  Administered 2016-07-31 – 2016-08-03 (×6): 100 mg via ORAL
  Filled 2016-07-31 (×6): qty 1

## 2016-07-31 MED ORDER — LIDOCAINE HCL (CARDIAC) 20 MG/ML IV SOLN
INTRAVENOUS | Status: DC | PRN
  Administered 2016-07-31: 40 mg via INTRAVENOUS

## 2016-07-31 MED ORDER — FENTANYL CITRATE (PF) 100 MCG/2ML IJ SOLN
INTRAMUSCULAR | Status: AC
  Filled 2016-07-31: qty 2

## 2016-07-31 MED ORDER — ONDANSETRON HCL 4 MG PO TABS: 4.0000 mg | ORAL_TABLET | Freq: Four times a day (QID) | ORAL | Status: DC | PRN

## 2016-07-31 MED ORDER — VITAMIN D 1000 UNITS PO TABS
5000.0000 [IU] | ORAL_TABLET | Freq: Every day | ORAL | Status: DC
  Administered 2016-08-01 – 2016-08-03 (×3): 5000 [IU] via ORAL
  Filled 2016-07-31 (×3): qty 5

## 2016-07-31 MED ORDER — FENTANYL CITRATE (PF) 100 MCG/2ML IJ SOLN
INTRAMUSCULAR | Status: DC | PRN
  Administered 2016-07-31 (×2): 25 ug via INTRAVENOUS

## 2016-07-31 MED ORDER — METHOCARBAMOL 1000 MG/10ML IJ SOLN: 500.0000 mg | Freq: Four times a day (QID) | INTRAVENOUS | Status: DC | PRN

## 2016-07-31 MED ORDER — ACETAMINOPHEN 325 MG PO TABS: 650.0000 mg | ORAL_TABLET | Freq: Four times a day (QID) | ORAL | Status: DC | PRN

## 2016-07-31 MED ORDER — PANTOPRAZOLE SODIUM 40 MG PO TBEC
80.0000 mg | DELAYED_RELEASE_TABLET | Freq: Every day | ORAL | Status: DC
  Administered 2016-08-01 – 2016-08-03 (×3): 80 mg via ORAL
  Filled 2016-07-31 (×3): qty 2

## 2016-07-31 MED ORDER — ONDANSETRON HCL 4 MG/2ML IJ SOLN
INTRAMUSCULAR | Status: DC | PRN
  Administered 2016-07-31: 4 mg via INTRAVENOUS

## 2016-07-31 MED ORDER — SODIUM CHLORIDE 0.9 % IR SOLN
Status: DC | PRN
  Administered 2016-07-31: 1000 mL

## 2016-07-31 MED ORDER — ONDANSETRON HCL 4 MG/2ML IJ SOLN
4.0000 mg | Freq: Four times a day (QID) | INTRAMUSCULAR | Status: DC | PRN
  Administered 2016-08-01: 4 mg via INTRAVENOUS
  Filled 2016-07-31: qty 2

## 2016-07-31 MED ORDER — CEFAZOLIN SODIUM-DEXTROSE 2-4 GM/100ML-% IV SOLN
2.0000 g | Freq: Four times a day (QID) | INTRAVENOUS | Status: AC
  Administered 2016-07-31 – 2016-08-01 (×2): 2 g via INTRAVENOUS
  Filled 2016-07-31 (×2): qty 100

## 2016-07-31 MED ORDER — 0.9 % SODIUM CHLORIDE (POUR BTL) OPTIME
TOPICAL | Status: DC | PRN
  Administered 2016-07-31: 1000 mL

## 2016-07-31 MED ORDER — DIPHENHYDRAMINE HCL 12.5 MG/5ML PO ELIX: 12.5000 mg | ORAL_SOLUTION | ORAL | Status: DC | PRN

## 2016-07-31 MED ORDER — PHENYLEPHRINE HCL 10 MG/ML IJ SOLN
INTRAMUSCULAR | Status: DC | PRN
  Administered 2016-07-31: 80 ug via INTRAVENOUS
  Administered 2016-07-31 (×2): 40 ug via INTRAVENOUS

## 2016-07-31 MED ORDER — ADULT MULTIVITAMIN W/MINERALS CH
1.0000 | ORAL_TABLET | Freq: Every day | ORAL | Status: DC
  Administered 2016-08-01 – 2016-08-03 (×3): 1 via ORAL
  Filled 2016-07-31 (×3): qty 1

## 2016-07-31 MED ORDER — ACETAMINOPHEN 325 MG PO TABS
650.0000 mg | ORAL_TABLET | Freq: Four times a day (QID) | ORAL | Status: DC | PRN
  Administered 2016-08-02 (×2): 650 mg via ORAL
  Filled 2016-07-31 (×2): qty 2

## 2016-07-31 MED ORDER — FENTANYL CITRATE (PF) 250 MCG/5ML IJ SOLN
INTRAMUSCULAR | Status: AC
  Filled 2016-07-31: qty 5

## 2016-07-31 MED ORDER — HYDROMORPHONE HCL 1 MG/ML IJ SOLN: 0.2500 mg | INTRAMUSCULAR | Status: DC | PRN

## 2016-07-31 MED ORDER — METOCLOPRAMIDE HCL 5 MG/ML IJ SOLN: 5.0000 mg | Freq: Three times a day (TID) | INTRAMUSCULAR | Status: DC | PRN

## 2016-07-31 MED ORDER — PROMETHAZINE HCL 25 MG/ML IJ SOLN: 6.2500 mg | INTRAMUSCULAR | Status: DC | PRN

## 2016-07-31 MED ORDER — ZOLPIDEM TARTRATE 5 MG PO TABS: 5.0000 mg | ORAL_TABLET | Freq: Every evening | ORAL | Status: DC | PRN

## 2016-07-31 MED ORDER — OXYCODONE HCL 5 MG PO TABS
5.0000 mg | ORAL_TABLET | ORAL | Status: DC | PRN
  Administered 2016-07-31 – 2016-08-03 (×8): 10 mg via ORAL
  Filled 2016-07-31 (×10): qty 2

## 2016-07-31 MED ORDER — PROPOFOL 500 MG/50ML IV EMUL
INTRAVENOUS | Status: DC | PRN
  Administered 2016-07-31: 100 ug/kg/min via INTRAVENOUS

## 2016-07-31 MED ORDER — MIDAZOLAM HCL 2 MG/2ML IJ SOLN
INTRAMUSCULAR | Status: AC
  Filled 2016-07-31: qty 2

## 2016-07-31 MED ORDER — B COMPLEX PO TABS: 1.0000 | ORAL_TABLET | Freq: Every day | ORAL | Status: DC

## 2016-07-31 MED ORDER — KETOROLAC TROMETHAMINE 15 MG/ML IJ SOLN
7.5000 mg | Freq: Four times a day (QID) | INTRAMUSCULAR | Status: AC
  Administered 2016-07-31 – 2016-08-01 (×4): 7.5 mg via INTRAVENOUS
  Filled 2016-07-31 (×4): qty 1

## 2016-07-31 MED ORDER — METHOCARBAMOL 500 MG PO TABS
500.0000 mg | ORAL_TABLET | Freq: Four times a day (QID) | ORAL | Status: DC | PRN
  Administered 2016-07-31 – 2016-08-02 (×3): 500 mg via ORAL
  Filled 2016-07-31 (×3): qty 1

## 2016-07-31 MED ORDER — HYDROMORPHONE HCL 1 MG/ML IJ SOLN
1.0000 mg | INTRAMUSCULAR | Status: DC | PRN
  Administered 2016-07-31 – 2016-08-01 (×2): 1 mg via INTRAVENOUS
  Administered 2016-08-02: 0.5 mg via INTRAVENOUS
  Filled 2016-07-31 (×3): qty 1

## 2016-07-31 MED ORDER — MENTHOL 3 MG MT LOZG: 1.0000 | LOZENGE | OROMUCOSAL | Status: DC | PRN

## 2016-07-31 MED ORDER — DEXTROSE 5 % IV SOLN
3.0000 g | Freq: Once | INTRAVENOUS | Status: AC
  Administered 2016-07-31: 3 g via INTRAVENOUS
  Filled 2016-07-31: qty 3000

## 2016-07-31 MED ORDER — B COMPLEX-C PO TABS
1.0000 | ORAL_TABLET | Freq: Every day | ORAL | Status: DC
  Administered 2016-08-01 – 2016-08-03 (×3): 1 via ORAL
  Filled 2016-07-31 (×3): qty 1

## 2016-07-31 MED ORDER — THERA M PLUS PO TABS: 1.0000 | ORAL_TABLET | Freq: Every day | ORAL | Status: DC

## 2016-07-31 MED ORDER — PHENYLEPHRINE HCL 10 MG/ML IJ SOLN
INTRAVENOUS | Status: DC | PRN
  Administered 2016-07-31: 15 ug/min via INTRAVENOUS

## 2016-07-31 MED ORDER — SODIUM CHLORIDE 0.9 % IV SOLN
INTRAVENOUS | Status: DC
  Administered 2016-07-31: 21:00:00 via INTRAVENOUS

## 2016-07-31 MED ORDER — OXYCODONE HCL 5 MG PO TABS: 5.0000 mg | ORAL_TABLET | Freq: Once | ORAL | Status: DC | PRN

## 2016-07-31 SURGICAL SUPPLY — 63 items
BANDAGE ACE 6X5 VEL STRL LF (GAUZE/BANDAGES/DRESSINGS) ×4 IMPLANT
BANDAGE ELASTIC 6 VELCRO ST LF (GAUZE/BANDAGES/DRESSINGS) ×4 IMPLANT
BANDAGE ESMARK 6X9 LF (GAUZE/BANDAGES/DRESSINGS) ×2 IMPLANT
BENZOIN TINCTURE PRP APPL 2/3 (GAUZE/BANDAGES/DRESSINGS) ×2 IMPLANT
BLADE SAG 18X100X1.27 (BLADE) ×2 IMPLANT
BNDG ESMARK 6X9 LF (GAUZE/BANDAGES/DRESSINGS) ×4
BOWL SMART MIX CTS (DISPOSABLE) ×2 IMPLANT
CAPT KNEE TOTAL 3 ×2 IMPLANT
CEMENT BONE SIMPLEX SPEEDSET (Cement) ×4 IMPLANT
CLSR STERI-STRIP ANTIMIC 1/2X4 (GAUZE/BANDAGES/DRESSINGS) ×2 IMPLANT
COVER SURGICAL LIGHT HANDLE (MISCELLANEOUS) ×2 IMPLANT
CUFF TOURNIQUET SINGLE 34IN LL (TOURNIQUET CUFF) ×2 IMPLANT
CUFF TOURNIQUET SINGLE 44IN (TOURNIQUET CUFF) IMPLANT
DRAPE EXTREMITY T 121X128X90 (DRAPE) ×2 IMPLANT
DRAPE HALF SHEET 40X57 (DRAPES) ×2 IMPLANT
DRAPE U-SHAPE 47X51 STRL (DRAPES) ×2 IMPLANT
DRSG PAD ABDOMINAL 8X10 ST (GAUZE/BANDAGES/DRESSINGS) ×2 IMPLANT
DURAPREP 26ML APPLICATOR (WOUND CARE) ×2 IMPLANT
ELECT CAUTERY BLADE 6.4 (BLADE) ×2 IMPLANT
ELECT REM PT RETURN 9FT ADLT (ELECTROSURGICAL) ×2
ELECTRODE REM PT RTRN 9FT ADLT (ELECTROSURGICAL) ×1 IMPLANT
FACESHIELD WRAPAROUND (MASK) ×4 IMPLANT
GAUZE SPONGE 4X4 12PLY STRL (GAUZE/BANDAGES/DRESSINGS) ×2 IMPLANT
GAUZE XEROFORM 1X8 LF (GAUZE/BANDAGES/DRESSINGS) ×2 IMPLANT
GLOVE BIOGEL PI IND STRL 8 (GLOVE) ×2 IMPLANT
GLOVE BIOGEL PI INDICATOR 8 (GLOVE) ×2
GLOVE ORTHO TXT STRL SZ7.5 (GLOVE) ×2 IMPLANT
GLOVE SURG ORTHO 8.0 STRL STRW (GLOVE) ×2 IMPLANT
GOWN STRL REUS W/ TWL LRG LVL3 (GOWN DISPOSABLE) IMPLANT
GOWN STRL REUS W/ TWL XL LVL3 (GOWN DISPOSABLE) ×2 IMPLANT
GOWN STRL REUS W/TWL LRG LVL3 (GOWN DISPOSABLE)
GOWN STRL REUS W/TWL XL LVL3 (GOWN DISPOSABLE) ×2
HANDPIECE INTERPULSE COAX TIP (DISPOSABLE) ×1
IMMOBILIZER KNEE 22 (SOFTGOODS) ×2 IMPLANT
IMMOBILIZER KNEE 22 UNIV (SOFTGOODS) ×2 IMPLANT
KIT BASIN OR (CUSTOM PROCEDURE TRAY) ×2 IMPLANT
KIT ROOM TURNOVER OR (KITS) ×2 IMPLANT
MANIFOLD NEPTUNE II (INSTRUMENTS) ×2 IMPLANT
NDL SAFETY ECLIPSE 18X1.5 (NEEDLE) IMPLANT
NEEDLE HYPO 18GX1.5 SHARP (NEEDLE)
NS IRRIG 1000ML POUR BTL (IV SOLUTION) ×2 IMPLANT
PACK TOTAL JOINT (CUSTOM PROCEDURE TRAY) ×2 IMPLANT
PAD ABD 8X10 STRL (GAUZE/BANDAGES/DRESSINGS) ×2 IMPLANT
PAD ARMBOARD 7.5X6 YLW CONV (MISCELLANEOUS) ×2 IMPLANT
PADDING CAST COTTON 6X4 STRL (CAST SUPPLIES) ×4 IMPLANT
SET HNDPC FAN SPRY TIP SCT (DISPOSABLE) ×1 IMPLANT
SET PAD KNEE POSITIONER (MISCELLANEOUS) ×2 IMPLANT
STAPLER VISISTAT 35W (STAPLE) IMPLANT
STRIP CLOSURE SKIN 1/2X4 (GAUZE/BANDAGES/DRESSINGS) IMPLANT
SUCTION FRAZIER HANDLE 10FR (MISCELLANEOUS) ×1
SUCTION TUBE FRAZIER 10FR DISP (MISCELLANEOUS) ×1 IMPLANT
SUT MNCRL AB 4-0 PS2 18 (SUTURE) IMPLANT
SUT VIC AB 0 CT1 27 (SUTURE) ×1
SUT VIC AB 0 CT1 27XBRD ANBCTR (SUTURE) ×1 IMPLANT
SUT VIC AB 1 CT1 27 (SUTURE) ×2
SUT VIC AB 1 CT1 27XBRD ANBCTR (SUTURE) ×2 IMPLANT
SUT VIC AB 2-0 CT1 27 (SUTURE) ×2
SUT VIC AB 2-0 CT1 TAPERPNT 27 (SUTURE) ×2 IMPLANT
SYR 50ML LL SCALE MARK (SYRINGE) IMPLANT
TOWEL OR 17X24 6PK STRL BLUE (TOWEL DISPOSABLE) ×2 IMPLANT
TOWEL OR 17X26 10 PK STRL BLUE (TOWEL DISPOSABLE) ×2 IMPLANT
TRAY CATH 16FR W/PLASTIC CATH (SET/KITS/TRAYS/PACK) IMPLANT
WRAP KNEE MAXI GEL POST OP (GAUZE/BANDAGES/DRESSINGS) ×2 IMPLANT

## 2016-07-31 NOTE — H&P (Signed)
TOTAL KNEE ADMISSION H&P  Patient is being admitted for left total knee arthroplasty.  Subjective:  Chief Complaint:left knee pain.  HPI: [name], 56 y.o. female, has a history of pain and functional disability in the left knee due to arthritis and has failed non-surgical conservative treatments for greater than 12 weeks to includeNSAID's and/or analgesics, corticosteriod injections, viscosupplementation injections, flexibility and strengthening excercises, weight reduction as appropriate and activity modification.  Onset of symptoms was gradual, starting 3 years ago with gradually worsening course since that time. The patient noted no past surgery on the left knee(s).  Patient currently rates pain in the left knee(s) at 10 out of 10 with activity. Patient has night pain, worsening of pain with activity and weight bearing, pain that interferes with activities of daily living, pain with passive range of motion, crepitus and joint swelling.  Patient has evidence of subchondral sclerosis, periarticular osteophytes and joint space narrowing by imaging studies. There is no active infection.  Patient Active Problem List   Diagnosis Date Noted  . Unilateral primary osteoarthritis, left knee 07/31/2016  . Abdominal hematoma   . Post-operative state   . Paroxysmal atrial fibrillation (HCC)   . Controlled type 2 diabetes mellitus with diabetic nephropathy ([place])   . Wound infection 01/25/2016  . Hydronephrosis, left 01/25/2016  . Hydronephrosis, right 01/25/2016  . DVT (deep venous thrombosis) ([place]) 01/25/2016  . Atrial fibrillation ([place]) 01/25/2016  . Diabetes mellitus with complication ([place])   . Wound infection after surgery, initial encounter   . Varicose veins of lower extremities with complications 82/50/5397  . Pain and swelling of left lower leg 10/27/2013  . Pain of left lower extremity 10/27/2013  . Gastroenteritis, acute 09/08/2013  . Fever, unspecified 09/04/2013  . ASD (atrial  septal defect) 08/13/2013  . Pulmonary embolism ([place]) 06/22/2013  . Dyspnea 06/11/2013  . Anterior slip repaired Oct 2012 01/18/2011  . GI bleed 12/18/2010  . Diabetes mellitus ([place]) 12/18/2010  . Varicose veins of lower extremities with other complications 67/34/1937  . Diabetes mellitus   . HIP PAIN, RIGHT 03/06/2010   Past Medical History:  Diagnosis Date  . Anemia   . Arthritis   . Atrial fibrillation ([place])   . Bleeding ulcer 12-2010  hospitalized for 1 week  . Blood transfusion   . Blood transfusion without reported diagnosis    2011-2 units transfused, bleeding ulcer  . Bruises easily   . CHF (congestive heart failure) ([place])    with PE resolved after   . Constipation   . Difficulty in swallowing 03-24-13   no longer a problem  . DVT (deep venous thrombosis) ([place]) 06-2013   right leg  . Dysrhythmia     Hx of A fib   . GERD (gastroesophageal reflux disease)   . Hyperlipidemia   . Leg swelling 03-24-13   not a problem any longer  . Peripheral vascular disease ([place])   . Pulmonary embolus ([place]) 06-2013  . Reflux   . Shortness of breath    with PE  . Vomiting 03-24-13   problem resolved after lap band revision    Past Surgical History:  Procedure Laterality Date  . APPENDECTOMY    . CARPEL TUNNEL BILATEREAL    . CHOLECYSTECTOMY     5'12  . COLONOSCOPY N/A 04/10/2013   Procedure: COLONOSCOPY;  Surgeon: Beryle Beams, MD;  Location: [facility]OSCOPY;  Service: Endoscopy;  Laterality: N/A;  . DIAGNOSTIC LAPAROSCOPY    . ENDOVENOUS ABLATION SAPHENOUS VEIN W/  LASER  06-2008  . ENDOVENOUS ABLATION SAPHENOUS VEIN W/ LASER Left 12-09-2013   EVLA LEFT GREATER SAPHENOUS VEIN BY TODD EARLY MD  . ESOPHAGOGASTRODUODENOSCOPY  12/18/2010   Procedure: ESOPHAGOGASTRODUODENOSCOPY (EGD);  Surgeon: Gatha Mayer, MD;  Location: [facility];  Service: Endoscopy;  Laterality: N/A;  . LAPAROSCOPIC GASTRIC BANDING  01-05-2008  . LAPAROSCOPIC GASTRIC BANDING  12/18/2010   01/04/2009  .  SALPHINGOOOPHORECTOMY Right    cyst removal  . TONSILLECTOMY    . WEDGE RESECTION OF OVARY  LEFT OVARY     Prescriptions Prior to Admission  Medication Sig Dispense Refill Last Dose  . acetaminophen (TYLENOL) 325 MG tablet Take 650 mg by mouth every 6 (six) hours as needed for mild pain or moderate pain.     . Ascorbic Acid (VITAMIN C PO) Take 1 tablet by mouth daily.   Taking  . b complex vitamins tablet Take 1 tablet by mouth daily.     . Biotin 10000 MCG TABS Take by mouth daily.   Taking  . Cholecalciferol (VITAMIN D HIGH POTENCY) 1000 units capsule Take 5,000 Units by mouth daily.    Taking  . hydroxychloroquine (PLAQUENIL) 200 MG tablet Take 200 mg by mouth 2 (two) times daily.   Taking  . linaclotide (LINZESS) 145 MCG CAPS capsule Take 145 mcg by mouth daily as needed (for constipation).     . metoprolol tartrate (LOPRESSOR) 25 MG tablet Take 0.5 tablets (12.5 mg total) by mouth 2 (two) times daily. 30 tablet 3 Taking  . Multiple Vitamins-Minerals (MULTIVITAMINS THER. W/MINERALS) TABS Take 1 tablet by mouth daily.     Taking  . omeprazole (PRILOSEC) 20 MG capsule Take 20 mg by mouth 2 (two) times daily before a meal.   Taking  . rivaroxaban (XARELTO) 20 MG TABS tablet Take 20 mg by mouth daily.    Taking   Allergies  Allergen Reactions  . Contrast Media [Iodinated Diagnostic Agents] Shortness Of Breath and Other (See Comments)    APNEA  . Penicillins Itching and Rash    PATIENT HAD A PCN REACTION WITH IMMEDIATE RASH, FACIAL/TONGUE/THROAT SWELLING, SOB, OR LIGHTHEADEDNESS WITH HYPOTENSION:  #  #  #  YES  #  #  #   Has patient had a PCN reaction causing severe rash involving mucus membranes or skin necrosis: NO Has patient had a PCN reaction that required hospitalization NO Has patient had a PCN reaction occurring within the last 10 years: NO  . Ancef [Cefazolin Sodium] Nausea And Vomiting    Tolerates IV    Social History  Substance Use Topics  . Smoking status: Never Smoker   . Smokeless tobacco: Never Used  . Alcohol use No    Family History  Problem Relation Age of Onset  . Heart disease Father   . Kidney disease Father   . Peripheral vascular disease Father   . Heart attack Father        50  . Diabetes Mother   . Arthritis Mother      Review of Systems  Musculoskeletal: Positive for joint pain.  All other systems reviewed and are negative.   Objective:  Physical Exam  Constitutional: She is oriented to person, place, and time. She appears well-developed and well-nourished.  HENT:  Head: Normocephalic and atraumatic.  Eyes: EOM are normal. Pupils are equal, round, and reactive to light.  Neck: Normal range of motion. Neck supple.  Cardiovascular: Normal rate.   Respiratory: Effort normal and breath sounds normal.  GI: Soft. Bowel sounds are normal.  Musculoskeletal:       Left knee: She exhibits decreased range of motion, effusion and abnormal alignment. Tenderness found. Medial joint line and lateral joint line tenderness noted.  Neurological: She is alert and oriented to person, place, and time.  Skin: Skin is warm and dry.  Psychiatric: She has a normal mood and affect.    Vital signs in last 24 hours: Temp:  [99.1 F (37.3 C)] 99.1 F (37.3 C) (06/26 1155) Pulse Rate:  [87] 87 (06/26 1155) Resp:  [20] 20 (06/26 1155) BP: (144)/(75) 144/75 (06/26 1155) SpO2:  [100 %] 100 % (06/26 1155) Weight:  [281 lb (127.5 kg)] 281 lb (127.5 kg) (06/26 1155)  Labs:   Estimated body mass index is 45.35 kg/m as calculated from the following:   Height as of 07/25/16: 5\' 6"  (1.676 m).   Weight as of this encounter: 281 lb (127.5 kg).   Imaging Review Plain radiographs demonstrate severe degenerative joint disease of the left knee(s). The overall alignment ismild varus. The bone quality appears to be good for age and reported activity level.  Assessment/Plan:  End stage arthritis, left knee   The patient history, physical examination,  clinical judgment of the provider and imaging studies are consistent with end stage degenerative joint disease of the left knee(s) and total knee arthroplasty is deemed medically necessary. The treatment options including medical management, injection therapy arthroscopy and arthroplasty were discussed at length. The risks and benefits of total knee arthroplasty were presented and reviewed. The risks due to aseptic loosening, infection, stiffness, patella tracking problems, thromboembolic complications and other imponderables were discussed. The patient acknowledged the explanation, agreed to proceed with the plan and consent was signed. Patient is being admitted for inpatient treatment for surgery, pain control, PT, OT, prophylactic antibiotics, VTE prophylaxis, progressive ambulation and ADL's and discharge planning. The patient is planning to be discharged home with home health services

## 2016-07-31 NOTE — Brief Op Note (Signed)
07/31/2016  4:43 PM  PATIENT:  [name]  56 y.o. female  PRE-OPERATIVE DIAGNOSIS:  Osteoarthritis Left Knee  POST-OPERATIVE DIAGNOSIS:  Osteoarthritis Left Knee  PROCEDURE:  Procedure(s): LEFT TOTAL KNEE ARTHROPLASTY (Left)  SURGEON:  Surgeon(s) and Role:    [name], MD - Primary  PHYSICIAN ASSISTANT: Benita Stabile, PA-C  ANESTHESIA:   regional and spinal  EBL:  Total I/O In: 1000 [I.V.:1000] Out: 575 [Urine:450; Blood:125]  COUNTS:  YES  TOURNIQUET:   Total Tourniquet Time Documented: Thigh (Left) - 54 minutes Total: Thigh (Left) - 54 minutes   DICTATION: .Other Dictation: Dictation Number [id number]  PLAN OF CARE: Admit to inpatient   PATIENT DISPOSITION:  PACU - hemodynamically stable.   Delay start of Pharmacological VTE agent (>24hrs) due to surgical blood loss or risk of bleeding: no

## 2016-07-31 NOTE — Anesthesia Procedure Notes (Signed)
Anesthesia Regional Block: Adductor canal block   Pre-Anesthetic Checklist: ,, timeout performed, Correct Patient, Correct Site, Correct Laterality, Correct Procedure,, site marked, risks and benefits discussed, Surgical consent,  Pre-op evaluation,  At surgeon's request and post-op pain management  Laterality: Left  Prep: chloraprep       Needles:  Injection technique: Single-shot  Needle Type: Echogenic Stimulator Needle     Needle Length: 9cm  Needle Gauge: 21     Additional Needles:   Procedures: ultrasound guided,,,,,,,,  Narrative:  Start time: 07/31/2016 1:15 PM End time: 07/31/2016 1:25 PM Injection made incrementally with aspirations every 5 mL.  Performed by: Personally  Anesthesiologist: Adele Barthel P  Additional Notes: Functioning IV was confirmed and monitors were applied.  A 29mm 21ga Arrow echogenic stimulator needle was used. Sterile prep,hand hygiene and sterile gloves were used.  Negative aspiration and negative test dose prior to incremental administration of local anesthetic. The patient tolerated the procedure well.

## 2016-07-31 NOTE — Transfer of Care (Signed)
Immediate Anesthesia Transfer of Care Note  Patient: [name]  Procedure(s) Performed: Procedure(s): LEFT TOTAL KNEE ARTHROPLASTY (Left)  Patient Location: PACU  Anesthesia Type:MAC and Spinal  Level of Consciousness: awake, alert  and oriented  Airway & Oxygen Therapy: Patient Spontanous Breathing  Post-op Assessment: Report given to RN and Post -op Vital signs reviewed and stable  Post vital signs: Reviewed and stable  Last Vitals:  Vitals:   07/31/16 1708 07/31/16 1709  BP:  120/68  Pulse:  69  Resp:  20  Temp: 36.4 C     Last Pain:  Vitals:   07/31/16 1708  TempSrc:   PainSc: 0-No pain         Complications: No apparent anesthesia complications

## 2016-07-31 NOTE — Progress Notes (Signed)
Orthopedic Tech Progress Note Patient Details:  [name] [birth year] [id number]  CPM Left Knee CPM Left Knee: On Left Knee Flexion (Degrees): 90 Left Knee Extension (Degrees): 0  Ortho Devices Ortho Device/Splint Interventions: Ordered, Application, Adjustment   Braulio Bosch 07/31/2016, 5:53 PM

## 2016-07-31 NOTE — Anesthesia Preprocedure Evaluation (Addendum)
Anesthesia Evaluation  Patient identified by MRN, date of birth, ID band Patient awake    Reviewed: Allergy & Precautions, H&P , NPO status , Patient's Chart, lab work & pertinent test results  Airway Mallampati: II  TM Distance: <3 FB Neck ROM: Full    Dental no notable dental hx.    Pulmonary neg pulmonary ROS,    Pulmonary exam normal breath sounds clear to auscultation       Cardiovascular negative cardio ROS Normal cardiovascular exam+ dysrhythmias Atrial Fibrillation  Rhythm:Regular Rate:Normal  ECG: NSR, rate 74. Incomplete RBBB  Cardiologist is Mertie Moores, MD who cleared pt for surgery at last office visit 07/10/16.    Neuro/Psych negative neurological ROS  negative psych ROS   GI/Hepatic Neg liver ROS, GERD  Medicated and Controlled,  Endo/Other  Morbid obesity  Renal/GU negative Renal ROS  negative genitourinary   Musculoskeletal  (+) Arthritis , Osteoarthritis,    Abdominal (+) + obese,   Peds negative pediatric ROS (+)  Hematology  (+) anemia ,   Anesthesia Other Findings DVT/PE Hyperlipidemia  Last dose of xarelto on Saturday morning 6/23  Reproductive/Obstetrics negative OB ROS                            Anesthesia Physical  Anesthesia Plan  ASA: II  Anesthesia Plan: Regional and Spinal   Post-op Pain Management:  Regional for Post-op pain   Induction: Intravenous  PONV Risk Score and Plan: 2 and Ondansetron, Dexamethasone and Propofol  Airway Management Planned: Natural Airway  Additional Equipment:   Intra-op Plan:   Post-operative Plan:   Informed Consent: I have reviewed the patients History and Physical, chart, labs and discussed the procedure including the risks, benefits and alternatives for the proposed anesthesia with the patient or authorized representative who has indicated his/her understanding and acceptance.     Plan Discussed with: CRNA  and Surgeon  Anesthesia Plan Comments: (Recent lab results reviewed )        Anesthesia Quick Evaluation

## 2016-07-31 NOTE — Anesthesia Procedure Notes (Signed)
Spinal  Patient location during procedure: OR Start time: 07/31/2016 3:00 PM End time: 07/31/2016 3:10 PM Staffing Anesthesiologist: Adele Barthel P Performed: anesthesiologist  Preanesthetic Checklist Completed: patient identified, surgical consent, pre-op evaluation, timeout performed, IV checked, risks and benefits discussed and monitors and equipment checked Spinal Block Patient position: sitting Prep: DuraPrep Patient monitoring: cardiac monitor, continuous pulse ox and blood pressure Approach: midline Location: L4-5 Injection technique: single-shot Needle Needle type: Pencan  Needle gauge: 24 G Needle length: 9 cm Assessment Sensory level: T10 Additional Notes Functioning IV was confirmed and monitors were applied. Sterile prep and drape, including hand hygiene and sterile gloves were used. The patient was positioned and the spine was prepped. The skin was anesthetized with lidocaine.  Free flow of clear CSF was obtained prior to injecting local anesthetic into the CSF.  The spinal needle aspirated freely following injection.  The needle was carefully withdrawn.  The patient tolerated the procedure well.

## 2016-08-01 ENCOUNTER — Encounter (HOSPITAL_COMMUNITY): Payer: Self-pay | Admitting: Orthopaedic Surgery

## 2016-08-01 LAB — BASIC METABOLIC PANEL
ANION GAP: 5 (ref 5–15)
BUN: 11 mg/dL (ref 6–20)
CALCIUM: 8 mg/dL — AB (ref 8.9–10.3)
CO2: 26 mmol/L (ref 22–32)
CREATININE: 0.7 mg/dL (ref 0.44–1.00)
Chloride: 108 mmol/L (ref 101–111)
Glucose, Bld: 111 mg/dL — ABNORMAL HIGH (ref 65–99)
Potassium: 3.8 mmol/L (ref 3.5–5.1)
SODIUM: 139 mmol/L (ref 135–145)

## 2016-08-01 LAB — CBC
HEMATOCRIT: 30.1 % — AB (ref 36.0–46.0)
Hemoglobin: 8.6 g/dL — ABNORMAL LOW (ref 12.0–15.0)
MCH: 21.1 pg — ABNORMAL LOW (ref 26.0–34.0)
MCHC: 28.6 g/dL — AB (ref 30.0–36.0)
MCV: 74 fL — ABNORMAL LOW (ref 78.0–100.0)
PLATELETS: 166 10*3/uL (ref 150–400)
RBC: 4.07 MIL/uL (ref 3.87–5.11)
RDW: 18.3 % — AB (ref 11.5–15.5)
WBC: 7 10*3/uL (ref 4.0–10.5)

## 2016-08-01 NOTE — Discharge Instructions (Addendum)
Information on my medicine - XARELTO (Rivaroxaban)  This medication education was reviewed with me or my healthcare representative as part of my discharge preparation.  The pharmacist that spoke with me during my hospital stay was:  Saundra Shelling, [facility]  Why was Xarelto prescribed for you? Xarelto was prescribed for you to reduce the risk of a blood clot forming that can cause a stroke if you have a medical condition called atrial fibrillation (a type of irregular heartbeat).  History of DVT/PE and post knee surgery  What do you need to know about xarelto ? Take your Xarelto ONCE DAILY at the same time every day with your evening meal. If you have difficulty swallowing the tablet whole, you may crush it and mix in applesauce just prior to taking your dose.  Take Xarelto exactly as prescribed by your doctor and DO NOT stop taking Xarelto without talking to the doctor who prescribed the medication.  Stopping without other stroke prevention medication to take the place of Xarelto may increase your risk of developing a clot that causes a stroke.  Refill your prescription before you run out.  After discharge, you should have regular check-up appointments with your healthcare provider that is prescribing your Xarelto.  In the future your dose may need to be changed if your kidney function or weight changes by a significant amount.  What do you do if you miss a dose? If you are taking Xarelto ONCE DAILY and you miss a dose, take it as soon as you remember on the same day then continue your regularly scheduled once daily regimen the next day. Do not take two doses of Xarelto at the same time or on the same day.   Important Safety Information A possible side effect of Xarelto is bleeding. You should call your healthcare provider right away if you experience any of the following: ? Bleeding from an injury or your nose that does not stop. ? Unusual colored urine (red or dark brown) or unusual  colored stools (red or black). ? Unusual bruising for unknown reasons. ? A serious fall or if you hit your head (even if there is no bleeding).  Some medicines may interact with Xarelto and might increase your risk of bleeding while on Xarelto. To help avoid this, consult your healthcare provider or pharmacist prior to using any new prescription or non-prescription medications, including herbals, vitamins, non-steroidal anti-inflammatory drugs (NSAIDs) and supplements.  This website has more information on Xarelto: [email or web address]-benson.[email or web address]   INSTRUCTIONS AFTER JOINT REPLACEMENT   o Remove items at home which could result in a fall. This includes throw rugs or furniture in walking pathways o ICE to the affected joint every three hours while awake for 30 minutes at a time, for at least the first 3-5 days, and then as needed for pain and swelling.  Continue to use ice for pain and swelling. You may notice swelling that will progress down to the foot and ankle.  This is normal after surgery.  Elevate your leg when you are not up walking on it.   o Continue to use the breathing machine you got in the hospital (incentive spirometer) which will help keep your temperature down.  It is common for your temperature to cycle up and down following surgery, especially at night when you are not up moving around and exerting yourself.  The breathing machine keeps your lungs expanded and your temperature down.   DIET:  As you were doing prior to hospitalization, we  recommend a well-balanced diet.  DRESSING / WOUND CARE / SHOWERING  Keep the surgical dressing until follow up.  The dressing is water proof, so you can shower without any extra covering.  IF THE DRESSING FALLS OFF or the wound gets wet inside, change the dressing with sterile gauze.  Please use good hand washing techniques before changing the dressing.  Do not use any lotions or creams on the incision until instructed by your surgeon.     ACTIVITY  o Increase activity slowly as tolerated, but follow the weight bearing instructions below.   o No driving for 6 weeks or until further direction given by your physician.  You cannot drive while taking narcotics.  o No lifting or carrying greater than 10 lbs. until further directed by your surgeon. o Avoid periods of inactivity such as sitting longer than an hour when not asleep. This helps prevent blood clots.  o You may return to work once you are authorized by your doctor.     WEIGHT BEARING   Weight bearing as tolerated with assist device (walker, cane, etc) as directed, use it as long as suggested by your surgeon or therapist, typically at least 4-6 weeks.   EXERCISES  Results after joint replacement surgery are often greatly improved when you follow the exercise, range of motion and muscle strengthening exercises prescribed by your doctor. Safety measures are also important to protect the joint from further injury. Any time any of these exercises cause you to have increased pain or swelling, decrease what you are doing until you are comfortable again and then slowly increase them. If you have problems or questions, call your caregiver or physical therapist for advice.   Rehabilitation is important following a joint replacement. After just a few days of immobilization, the muscles of the leg can become weakened and shrink (atrophy).  These exercises are designed to build up the tone and strength of the thigh and leg muscles and to improve motion. Often times heat used for twenty to thirty minutes before working out will loosen up your tissues and help with improving the range of motion but do not use heat for the first two weeks following surgery (sometimes heat can increase post-operative swelling).   These exercises can be done on a training (exercise) mat, on the floor, on a table or on a bed. Use whatever works the best and is most comfortable for you.    Use music or  television while you are exercising so that the exercises are a pleasant break in your day. This will make your life better with the exercises acting as a break in your routine that you can look forward to.   Perform all exercises about fifteen times, three times per day or as directed.  You should exercise both the operative leg and the other leg as well.  Exercises include:    Quad Sets - Tighten up the muscle on the front of the thigh (Quad) and hold for 5-10 seconds.    Straight Leg Raises - With your knee straight (if you were given a brace, keep it on), lift the leg to 60 degrees, hold for 3 seconds, and slowly lower the leg.  Perform this exercise against resistance later as your leg gets stronger.   Leg Slides: Lying on your back, slowly slide your foot toward your buttocks, bending your knee up off the floor (only go as far as is comfortable). Then slowly slide your foot back down until your leg  is flat on the floor again.   Angel Wings: Lying on your back spread your legs to the side as far apart as you can without causing discomfort.   Hamstring Strength:  Lying on your back, push your heel against the floor with your leg straight by tightening up the muscles of your buttocks.  Repeat, but this time bend your knee to a comfortable angle, and push your heel against the floor.  You may put a pillow under the heel to make it more comfortable if necessary.   A rehabilitation program following joint replacement surgery can speed recovery and prevent re-injury in the future due to weakened muscles. Contact your doctor or a physical therapist for more information on knee rehabilitation.    CONSTIPATION  Constipation is defined medically as fewer than three stools per week and severe constipation as less than one stool per week.  Even if you have a regular bowel pattern at home, your normal regimen is likely to be disrupted due to multiple reasons following surgery.  Combination of anesthesia,  postoperative narcotics, change in appetite and fluid intake all can affect your bowels.   YOU MUST use at least one of the following options; they are listed in order of increasing strength to get the job done.  They are all available over the counter, and you may need to use some, POSSIBLY even all of these options:    Drink plenty of fluids (prune juice may be helpful) and high fiber foods Colace 100 mg by mouth twice a day  Senokot for constipation as directed and as needed Dulcolax (bisacodyl), take with full glass of water  Miralax (polyethylene glycol) once or twice a day as needed.  If you have tried all these things and are unable to have a bowel movement in the first 3-4 days after surgery call either your surgeon or your primary doctor.    If you experience loose stools or diarrhea, hold the medications until you stool forms back up.  If your symptoms do not get better within 1 week or if they get worse, check with your doctor.  If you experience "the worst abdominal pain ever" or develop nausea or vomiting, please contact the office immediately for further recommendations for treatment.   ITCHING:  If you experience itching with your medications, try taking only a single pain pill, or even half a pain pill at a time.  You can also use Benadryl over the counter for itching or also to help with sleep.   TED HOSE STOCKINGS:  Use stockings on both legs until for at least 2 weeks or as directed by physician office. They may be removed at night for sleeping.  MEDICATIONS:  See your medication summary on the After Visit Summary that nursing will review with you.  You may have some home medications which will be placed on hold until you complete the course of blood thinner medication.  It is important for you to complete the blood thinner medication as prescribed.  PRECAUTIONS:  If you experience chest pain or shortness of breath - call 911 immediately for transfer to the hospital emergency  department.   If you develop a fever greater that 101 F, purulent drainage from wound, increased redness or drainage from wound, foul odor from the wound/dressing, or calf pain - CONTACT YOUR SURGEON.  FOLLOW-UP APPOINTMENTS:  If you do not already have a post-op appointment, please call the office for an appointment to be seen by your surgeon.  Guidelines for how soon to be seen are listed in your “After Visit Summary”, but are typically between 1-4 weeks after surgery. ° °OTHER INSTRUCTIONS:  ° °Knee Replacement:  Do not place pillow under knee, focus on keeping the knee straight while resting. CPM instructions: 0-90 degrees, 2 hours in the morning, 2 hours in the afternoon, and 2 hours in the evening. Place foam block, curve side up under heel at all times except when in CPM or when walking.  DO NOT modify, tear, cut, or change the foam block in any way. ° °MAKE SURE YOU:  °• Understand these instructions.  °• Get help right away if you are not doing well or get worse.  ° ° °Thank you for letting us be a part of your medical care team.  It is a privilege we respect greatly.  We hope these instructions will help you stay on track for a fast and full recovery!  ° °

## 2016-08-01 NOTE — Anesthesia Postprocedure Evaluation (Signed)
Anesthesia Post Note  Patient: CALLEEN ALVIS  Procedure(s) Performed: Procedure(s) (LRB): LEFT TOTAL KNEE ARTHROPLASTY (Left)     Patient location during evaluation: PACU Anesthesia Type: Spinal Level of consciousness: oriented and awake and alert Pain management: pain level controlled Vital Signs Assessment: post-procedure vital signs reviewed and stable Respiratory status: spontaneous breathing, respiratory function stable and patient connected to nasal cannula oxygen Cardiovascular status: blood pressure returned to baseline and stable Postop Assessment: no headache, no backache, spinal receding and patient able to bend at knees Anesthetic complications: no    Last Vitals:  Vitals:   08/01/16 0015 08/01/16 0421  BP: (!) 105/56 109/64  Pulse: 79 73  Resp: 14 16  Temp: 37.4 C 37.1 C    Last Pain:  Vitals:   08/01/16 0612  TempSrc:   PainSc: Asleep                 Catalina Gravel

## 2016-08-01 NOTE — Progress Notes (Signed)
Subjective: 1 Day Post-Op Procedure(s) (LRB): LEFT TOTAL KNEE ARTHROPLASTY (Left) Patient reports pain as moderate.  Acute blood loss anemia from surgery.  Vitals stable.  Objective: Vital signs in last 24 hours: Temp:  [97.5 F (36.4 C)-99.3 F (37.4 C)] 98.7 F (37.1 C) (06/27 0421) Pulse Rate:  [59-87] 73 (06/27 0421) Resp:  [14-20] 16 (06/27 0421) BP: (94-144)/(51-75) 109/64 (06/27 0421) SpO2:  [97 %-100 %] 100 % (06/27 0421) Weight:  [281 lb (127.5 kg)] 281 lb (127.5 kg) (06/26 1155)  Intake/Output from previous day: 06/26 0701 - 06/27 0700 In: 2840 [P.O.:240; I.V.:2600] Out: 1925 [Urine:1800; Blood:125] Intake/Output this shift: No intake/output data recorded.   Recent Labs  08/01/16 0503  HGB 8.6*    Recent Labs  08/01/16 0503  WBC 7.0  RBC 4.07  HCT 30.1*  PLT 166    Recent Labs  08/01/16 0503  NA 139  K 3.8  CL 108  CO2 26  BUN 11  CREATININE 0.70  GLUCOSE 111*  CALCIUM 8.0*   No results for input(s): LABPT, INR in the last 72 hours.  Sensation intact distally Intact pulses distally Dorsiflexion/Plantar flexion intact Incision: dressing C/D/I Compartment soft  Assessment/Plan: 1 Day Post-Op Procedure(s) (LRB): LEFT TOTAL KNEE ARTHROPLASTY (Left) Up with therapy  [name] 08/01/2016, 7:03 AM

## 2016-08-01 NOTE — Evaluation (Signed)
Physical Therapy Evaluation Patient Details Name: [name] MRN: [id number] DOB: 1960/05/08 Today's Date: 08/01/2016   History of Present Illness  Pt presents for L TKA with PMH: DM, PE, A-fib  Clinical Impression  Pt is s/p TKA resulting in the deficits listed below (see PT Problem List). Pt limited on eval due to dizziness and nausea. Was able to pivot to chair with min A and then stand one more time but unable to progress ambulation. At rest pt's LLE is externally rotating at hip which is compromising ability to keep leg in extension, zero knee foam would be helpful to keep foot and knee in alignment.  Pt will benefit from skilled PT to increase their independence and safety with mobility to allow discharge to the venue listed below.      Follow Up Recommendations Home health PT;Supervision for mobility/OOB    Equipment Recommendations  Rolling walker with 5" wheels    Recommendations for Other Services       Precautions / Restrictions Precautions Precautions: Knee Precaution Booklet Issued: No Precaution Comments: reviewed proper positioning of knee and use of CPM Required Braces or Orthoses: Knee Immobilizer - Left Restrictions Weight Bearing Restrictions: Yes LLE Weight Bearing: Weight bearing as tolerated      Mobility  Bed Mobility Overal bed mobility: Needs Assistance Bed Mobility: Supine to Sit     Supine to sit: Min assist     General bed mobility comments: vc's for sequencing and min A to unweight LLE as pt slid legs off bed  Transfers Overall transfer level: Needs assistance Equipment used: Rolling walker (2 wheeled) Transfers: Sit to/from Stand Sit to Stand: Min guard Stand pivot transfers: Min assist       General transfer comment: vc's fir hand placement. Min-guard for safety as pt was dizzy with standing each time. Pt was able to pivot to chair with RW but did not feel well enough to ambulate away from chair, was dizzy and then became  nauseous when up a second time   Ambulation/Gait             General Gait Details: dizzy and nauseous  Stairs            Wheelchair Mobility    Modified Rankin (Stroke Patients Only)       Balance Overall balance assessment: Needs assistance Sitting-balance support: Single extremity supported Sitting balance-Leahy Scale: Good     Standing balance support: Bilateral upper extremity supported Standing balance-Leahy Scale: Poor Standing balance comment: requires UE support due to dizziness                             Pertinent Vitals/Pain Pain Assessment: Faces Faces Pain Scale: Hurts whole lot Pain Location: left knee Pain Descriptors / Indicators: Aching;Sore;Operative site guarding Pain Intervention(s): Limited activity within patient's tolerance;Monitored during session    Home Living Family/patient expects to be discharged to:: Private residence Living Arrangements: Spouse/significant other Available Help at Discharge: Family;Available 24 hours/day Type of Home: House Home Access: Stairs to enter Entrance Stairs-Rails: Right Entrance Stairs-Number of Steps: 1 Home Layout: One level Home Equipment: Toilet riser Additional Comments: 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[employer]    Prior Function Level of Independence: Independent               Journalist, newspaper        Extremity/Trunk Assessment   Upper Extremity Assessment Upper Extremity Assessment:  Overall [facility] for tasks assessed    Lower Extremity Assessment Lower Extremity Assessment: LLE deficits/detail LLE Deficits / Details: hip flex 3-/5, knee flex 3/5, knee ext 3-/5 ankle WFL    Cervical / Trunk Assessment Cervical / Trunk Assessment: Normal  Communication   Communication: No difficulties  Cognition Arousal/Alertness: Awake/alert Behavior During Therapy: WFL for tasks assessed/performed Overall Cognitive Status: Within Functional Limits for  tasks assessed                                        General Comments General comments (skin integrity, edema, etc.): pt externally rotates at L hip, would be helpful to have zero knee foam to assist with positioning in extension    Exercises Total Joint Exercises Ankle Circles/Pumps: AROM;Both;Supine;10 reps Quad Sets: AROM;Both;10 reps;Supine Heel Slides: AROM;Left;5 reps;Supine Straight Leg Raises: AROM;Left;5 reps;Seated Long Arc Quad: AROM;Left;10 reps;Seated Knee Flexion: AROM;Left;10 reps;Seated Goniometric ROM: 15-60 Marching in Standing: AROM;Both;10 reps;Standing   Assessment/Plan    PT Assessment Patient needs continued PT services  PT Problem List Decreased strength;Decreased range of motion;Decreased activity tolerance;Decreased balance;Decreased mobility;Decreased knowledge of use of DME;Decreased knowledge of precautions;Pain;Obesity       PT Treatment Interventions DME instruction;Gait training;Stair training;Functional mobility training;Therapeutic activities;Therapeutic exercise;Balance training;Patient/family education    PT Goals (Current goals can be found in the Care Plan section)  Acute Rehab PT Goals Patient Stated Goal: return home PT Goal Formulation: With patient Time For Goal Achievement: 08/08/16 Potential to Achieve Goals: Good    Frequency 7X/week   Barriers to discharge        Co-evaluation               AM-PAC PT "6 Clicks" Daily Activity  Outcome Measure Difficulty turning over in bed (including adjusting bedclothes, sheets and blankets)?: Total Difficulty moving from lying on back to sitting on the side of the bed? : Total Difficulty sitting down on and standing up from a chair with arms (e.g., wheelchair, bedside commode, etc,.)?: Total Help needed moving to and from a bed to chair (including a wheelchair)?: A Little Help needed walking in hospital room?: A Little Help needed climbing 3-5 steps with a railing?  : A Little 6 Click Score: 12    End of Session Equipment Utilized During Treatment: Gait belt;Oxygen Activity Tolerance: Treatment limited secondary to medical complications (Comment) (dizzy, nauseous) Patient left: in chair;with call bell/phone within reach Nurse Communication: Mobility status;Other (comment) (pt requesting zophran) PT Visit Diagnosis: Dizziness and giddiness (R42);Pain;Difficulty in walking, not elsewhere classified (R26.2) Pain - Right/Left: Left Pain - part of body: Knee    Time: 8250-5397 PT Time Calculation (min) (ACUTE ONLY): 37 min   Charges:   PT Evaluation $PT Eval Moderate Complexity: 1 Procedure PT Treatments $Therapeutic Activity: 8-22 mins   PT G Codes:        [place] 08/01/2016, 1:21 PM

## 2016-08-01 NOTE — Op Note (Signed)
NAME:  [name], [name]                  ACCOUNT NO.:  MEDICAL RECORD NO.:  [id number]  LOCATION:                                 FACILITY:  PHYSICIAN:  Lind Guest. Ninfa Linden, M.D.DATE OF BIRTH:  DATE OF PROCEDURE:  07/31/2016 DATE OF DISCHARGE:                              OPERATIVE REPORT   PREOPERATIVE DIAGNOSES:  Left knee with severe osteoarthritis, degenerative joint disease, and valgus malalignment.  POSTOPERATIVE DIAGNOSES:  Left knee with severe osteoarthritis, degenerative joint disease, and valgus malalignment.  PROCEDURE:  Left total knee arthroplasty.  IMPLANTS:  Stryker Triathlon knee with size 4 femur, size 3 Universal tibial baseplate, 13 mm fix-bearing polyethylene insert, size 29 patellar button.  SURGEON:  Lind Guest. Ninfa Linden, M.D.  ASSISTANT:  Erskine Emery, PA-C.  ANESTHESIA: 1. Left lower extremity adductor canal block. 2. Spinal.  ANTIBIOTICS:  3 g of IV Ancef.  TOURNIQUET TIME:  Under 1 hour.  BLOOD LOSS:  100 mL.  COMPLICATIONS:  None.  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  She has been a patient of mine for good many years now.  She has had a significant valgus deformity of her left knee, and she has tried and failed all forms of conservative treatment.  Her x- ray showed complete loss of the lateral joint line and significant arthritis throughout the knee in general.  This has now detrimentally affecting her activities of daily living, her quality of life, and her mobility.  Her pain is 10/10.  At this point, she does wish to proceed with a total knee arthroplasty.  She understands the risk of acute blood loss anemia, nerve and vessel injury, as well as fracture, infection, and DVT.  She understands her goals are decreased pain, improved mobility, and overall improved quality of life.  PROCEDURE DESCRIPTION:  After informed consent was obtained, appropriate left leg was  marked.  Anesthesia obtained, an adductor canal block in the holding room.  She was brought to the operating room and sat up on the operating table where spinal anesthesia was obtained.  She was then laid in supine position.  A Foley catheter was placed, and a nonsterile tourniquet was placed around her upper left thigh.  Her left thigh, knee, ankle, and foot were prepped and draped with DuraPrep and sterile drapes.  Time-out was called.  She was identified as patient, correct left knee.  We then used an Esmarch to wrap out the leg and tourniquet was inflated to 300 mm of pressure.  I then made incision over the patella and carried this proximally and distally.  We dissected down the knee joint, carried out a medial parapatellar arthrotomy and found a very large joint effusion and complete loss of cartilage on the lateral aspect of her knee.  There is significant patellofemoral disease as well.  There are periarticular osteophytes throughout the knee which we removed, and then, removed remnants of ACL, PCL, medial, and lateral meniscus.  Of note, her knee did hyperextend before this as well, has had a valgus malalignment.  With the knee in a flexed position, we set our extramedullary cutting guide  for the tibia, and we set this based off a neutral slope correcting for varus and valgus and set the cutting block to take 9 mm off the high side.  We made this cut without difficulty.  We then went to the femur and made the intramedullary start site for the intramedullary alignment guide for the distal femoral cutting guide.  This distal femoral cutting guide was left, set at 5 degrees externally rotated for taking 8 mm off the distal femoral cut. We made this cut without difficulty.  We then brought the knee back down in full extension with 11 mm extension block.  I felt we had obtained full extension.  We then went back to the femur and put our femoral sizing guide based off the epicondylar  axis and chose a size 4 femur. We put a 4-in-1 cutting block for a size 4 femur, made our anterior and posterior cuts followed by our chamfer cuts.  We then made our femoral box cut.  We then went back to the tibia and chose a size 4 tibial tray, which offered good coverage.  We set the rotation based off the femur and the tibial tubercle.  Of note, her patella does track decently lateral.  I did perform a lateral release as well.  We then made our drill for the Universal baseplate and our keel cut, and then with the size 4 trial tibia followed by the 4 femur, we trialed a 9 and a 13-mm fix-bearing polyethylene insert, and we were pleased with the range of motion, stability, and contact of the size 13 insert.  We then made our patellar cut and drilled 3 holes for a symmetric patellar button.  We then removed all instrumentation from the knee and removed all trial components.  We irrigated the knee with normal saline solution using pulsatile lavage and then mixed our cement.  We then cemented the real Stryker Triathlon tibial baseplate size 4, and then, the real size 4 left femur.  We placed the real 13 mm fix-bearing polyethylene insert and cemented the patellar button.  We did remove cement debris from the knee and then, once the cement had hardened, we let the tourniquet down. Hemostasis was obtained with electrocautery.  We then irrigated the knee again with normal saline solution and closed the arthrotomy with interrupted #1 Vicryl suture followed by 0 Vicryl in the deep tissue, 2- 0 Vicryl in the subcutaneous tissue, 4-0 Monocryl subcuticular stitch, and Steri-Strips on the skin.  Well-padded sterile dressing was applied. She was taken to the recovery room in stable condition.  All final counts were correct.  There were no complications noted.  Of note, Erskine Emery, PA-C, assisted in the entire case.  His assistance was crucial for facilitating all aspects of this  case.     Lind Guest. Ninfa Linden, M.D.     CYB/MEDQ  D:  07/31/2016  T:  07/31/2016  Job:  [id number]

## 2016-08-01 NOTE — Progress Notes (Signed)
Physical Therapy Treatment Patient Details Name: [name] MRN: [id number] DOB: 1960/09/30 Today's Date: 08/01/2016    History of Present Illness Pt presents for L TKA with PMH: DM, PE, A-fib    PT Comments    Pt tolerated mobility better in PM without dizziness or nausea, was able to 110' with RW and min-guard A. Was positioned in CPM 0-75 degrees after session. PT will continue to follow.    Follow Up Recommendations  Home health PT;Supervision for mobility/OOB     Equipment Recommendations  Rolling walker with 5" wheels    Recommendations for Other Services       Precautions / Restrictions Precautions Precautions: Knee Precaution Booklet Issued: Yes (comment) Precaution Comments: reviewed proper positioning of knee and use of CPM (gave ther ex handout) Required Braces or Orthoses: Knee Immobilizer - Left Restrictions Weight Bearing Restrictions: Yes LLE Weight Bearing: Weight bearing as tolerated    Mobility  Bed Mobility Overal bed mobility: Needs Assistance Bed Mobility: Sit to Supine     Supine to sit: Min assist Sit to supine: Min assist   General bed mobility comments: min A to LLE for lifting into bed and positioning in CPM, pt able to manage upper body and scoot in bed  Transfers Overall transfer level: Needs assistance Equipment used: Rolling walker (2 wheeled) Transfers: Sit to/from Stand Sit to Stand: Min guard Stand pivot transfers: Min guard       General transfer comment: pt not dizzy in PM  Ambulation/Gait Ambulation/Gait assistance: Min guard Ambulation Distance (Feet): 75 Feet Assistive device: Rolling walker (2 wheeled) Gait Pattern/deviations: Step-through pattern;Decreased weight shift to left;Decreased step length - left;Antalgic Gait velocity: decreased Gait velocity interpretation: Below normal speed for age/gender General Gait Details: pt painful with ambulation but not dizzy or nauseous in PM. vc's for  sequencing   Stairs            Wheelchair Mobility    Modified Rankin (Stroke Patients Only)       Balance Overall balance assessment: Needs assistance Sitting-balance support: Single extremity supported Sitting balance-Leahy Scale: Good     Standing balance support: Bilateral upper extremity supported Standing balance-Leahy Scale: Poor Standing balance comment: reliant UE support                            Cognition Arousal/Alertness: Awake/alert Behavior During Therapy: WFL for tasks assessed/performed Overall Cognitive Status: Within Functional Limits for tasks assessed                                        Exercises Total Joint Exercises Ankle Circles/Pumps: AROM;Both;10 reps;Seated Quad Sets: AROM;Both;10 reps;Supine Heel Slides: AROM;Left;Seated;10 reps Straight Leg Raises: AROM;Left;5 reps;Seated Long Arc Quad: AROM;Left;10 reps;Seated Knee Flexion: AROM;Left;10 reps;Seated Goniometric ROM: 10-80 Marching in Standing: AROM;Both;10 reps;Standing    General Comments General comments (skin integrity, edema, etc.): pt externally rotates at L hip, would be helpful to have zero knee foam to assist with positioning in extension      Pertinent Vitals/Pain Pain Assessment: 0-10 Pain Score: 6  Faces Pain Scale: Hurts whole lot Pain Location: left knee Pain Descriptors / Indicators: Aching;Sore;Operative site guarding Pain Intervention(s): Limited activity within patient's tolerance;Monitored during session;Premedicated before session    Home Living Family/patient expects to be discharged to:: Private residence Living Arrangements: Spouse/significant other Available Help at Discharge: Family;Available 24 hours/day  Type of Home: House Home Access: Stairs to enter Entrance Stairs-Rails: Right Home Layout: One level Home Equipment: Toilet riser Additional Comments: 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[employer]    Prior Function Level of Independence: Independent          PT Goals (current goals can now be found in the care plan section) Acute Rehab PT Goals Patient Stated Goal: return home PT Goal Formulation: With patient Time For Goal Achievement: 08/08/16 Potential to Achieve Goals: Good Progress towards PT goals: Progressing toward goals    Frequency    7X/week      PT Plan Current plan remains appropriate    Co-evaluation              AM-PAC PT "6 Clicks" Daily Activity  Outcome Measure  Difficulty turning over in bed (including adjusting bedclothes, sheets and blankets)?: A Little Difficulty moving from lying on back to sitting on the side of the bed? : A Little Difficulty sitting down on and standing up from a chair with arms (e.g., wheelchair, bedside commode, etc,.)?: A Little Help needed moving to and from a bed to chair (including a wheelchair)?: A Little Help needed walking in hospital room?: A Little Help needed climbing 3-5 steps with a railing? : A Little 6 Click Score: 18    End of Session Equipment Utilized During Treatment: Gait belt;Left knee immobilizer Activity Tolerance: Patient tolerated treatment well Patient left: with call bell/phone within reach;in bed;in CPM;with family/visitor present Nurse Communication: Mobility status PT Visit Diagnosis: Pain;Difficulty in walking, not elsewhere classified (R26.2) Pain - Right/Left: Left Pain - part of body: Knee     Time: 5188-4166 PT Time Calculation (min) (ACUTE ONLY): 18 min  Charges:  $Gait Training: 8-22 mins $Therapeutic Activity: 8-22 mins                    G Codes:       Leighton Roach, PT  Acute Rehab Services  [place] 08/01/2016, 4:13 PM

## 2016-08-01 NOTE — Evaluation (Signed)
Occupational Therapy Evaluation Patient Details Name: [name] MRN: [id number] DOB: 1960/04/21 Today's Date: 08/01/2016    History of Present Illness Pt presents for L TKA with PMH: DM, PE, A-fib   Clinical Impression   Pt admitted with above. She demonstrates the below listed deficits and will benefit from continued OT to maximize safety and independence with BADLs.  Pt requires mod A for LB ADLs and min guard for functional transfers.   Spouse is supportive and will be able to provide adequate level of assist at discharge.  Will follow acutely.        Follow Up Recommendations  No OT follow up;Supervision/Assistance - 24 hour    Equipment Recommendations  3 in 1 bedside commode;Tub/shower bench    Recommendations for Other Services       Precautions / Restrictions Precautions Precautions: Knee Precaution Booklet Issued: Yes (comment) Precaution Comments: reviewed proper positioning of knee and use of CPM Required Braces or Orthoses: Knee Immobilizer - Left Restrictions Weight Bearing Restrictions: Yes LLE Weight Bearing: Weight bearing as tolerated      Mobility Bed Mobility Overal bed mobility: Needs Assistance Bed Mobility: Sit to Supine       Sit to supine: Min assist   General bed mobility comments: pt up in chair   Transfers Overall transfer level: Needs assistance Equipment used: Rolling walker (2 wheeled) Transfers: Sit to/from Stand Sit to Stand: Min guard Stand pivot transfers: Min guard       General transfer comment: min guard for safety.  cues for technique     Balance Overall balance assessment: Needs assistance Sitting-balance support: Single extremity supported Sitting balance-Leahy Scale: Good     Standing balance support: Bilateral upper extremity supported Standing balance-Leahy Scale: Poor Standing balance comment: reliant UE support                           ADL either performed or assessed with clinical  judgement   ADL Overall ADL's : Needs assistance/impaired Eating/Feeding: Independent   Grooming: Wash/dry hands;Wash/dry face;Oral care;Brushing hair;Minimal assistance;Standing   Upper Body Bathing: Set up;Supervision/ safety;Sitting   Lower Body Bathing: Moderate assistance;Sit to/from stand   Upper Body Dressing : Minimal assistance;Sitting   Lower Body Dressing: Maximal assistance;Sit to/from stand   Toilet Transfer: Min guard;Ambulation;Comfort height toilet;BSC;Grab bars;RW   Toileting- Clothing Manipulation and Hygiene: Minimal assistance;Sit to/from stand       Functional mobility during ADLs: Surveyor, minerals     Praxis      Pertinent Vitals/Pain Pain Assessment: Faces Pain Score: 6  Faces Pain Scale: Hurts even more Pain Location: left knee Pain Descriptors / Indicators: Aching;Sore;Operative site guarding Pain Intervention(s): Monitored during session;Repositioned     Hand Dominance     Extremity/Trunk Assessment Upper Extremity Assessment Upper Extremity Assessment: Overall WFL for tasks assessed   Lower Extremity Assessment Lower Extremity Assessment: Defer to PT evaluation   Cervical / Trunk Assessment Cervical / Trunk Assessment: Normal   Communication Communication Communication: No difficulties   Cognition Arousal/Alertness: Awake/alert Behavior During Therapy: WFL for tasks assessed/performed Overall Cognitive Status: Within Functional Limits for tasks assessed                                     General Comments  Shoulder Instructions      Home Living Family/patient expects to be discharged to:: Private residence Living Arrangements: Spouse/significant other Available Help at Discharge: Family;Available 24 hours/day Type of Home: House Home Access: Stairs to enter [employer] of Steps: 1 Entrance Stairs-Rails: Right Home Layout: One level      Bathroom Shower/Tub: Teacher, early years/pre: Standard     Home Equipment: Toilet riser   Additional Comments: 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[employer]      Prior Functioning/Environment Level of Independence: Independent                 OT Problem List: Decreased strength;Decreased activity tolerance;Impaired balance (sitting and/or standing);Decreased knowledge of use of DME or AE;Decreased knowledge of precautions;Obesity;Pain      OT Treatment/Interventions:      OT Goals(Current goals can be found in the care plan section) Acute Rehab OT Goals Patient Stated Goal: return home OT Goal Formulation: With patient Time For Goal Achievement: 08/08/16 Potential to Achieve Goals: Good ADL Goals Pt Will Perform Grooming: with supervision;standing Pt Will Perform Lower Body Bathing: with supervision;with adaptive equipment;sit to/from stand Pt Will Perform Lower Body Dressing: with supervision;with adaptive equipment;sit to/from stand Pt Will Transfer to Toilet: with supervision;ambulating;regular height toilet;bedside commode;grab bars Pt Will Perform Toileting - Clothing Manipulation and hygiene: with supervision;sit to/from stand Pt Will Perform Tub/Shower Transfer: Tub transfer;with min assist;tub bench;grab bars;rolling walker  OT Frequency:     Barriers to D/C:            Co-evaluation              AM-PAC PT "6 Clicks" Daily Activity     Outcome Measure Help from another person eating meals?: None Help from another person taking care of personal grooming?: A Little Help from another person toileting, which includes using toliet, bedpan, or urinal?: A Little Help from another person bathing (including washing, rinsing, drying)?: A Lot Help from another person to put on and taking off regular upper body clothing?: A Little Help from another person to put on and taking off regular lower body clothing?: A Lot 6 Click  Score: 17   End of Session Equipment Utilized During Treatment: Rolling walker;Gait belt CPM Left Knee CPM Left Knee: On Left Knee Flexion (Degrees): 75 Left Knee Extension (Degrees): 0  Activity Tolerance: Patient tolerated treatment well Patient left: Other (comment) (with PT )  OT Visit Diagnosis: Pain Pain - Right/Left: Left Pain - part of body: Knee                Time: 5638-9373 OT Time Calculation (min): 18 min Charges:  OT General Charges $OT Visit: 1 Procedure OT Evaluation $OT Eval Moderate Complexity: 1 Procedure G-Codes:     Lucille Passy, OTR/L [phone]   Lucille Passy M 08/01/2016, 5:23 PM

## 2016-08-02 LAB — CBC
HEMATOCRIT: 29.6 % — AB (ref 36.0–46.0)
Hemoglobin: 8.5 g/dL — ABNORMAL LOW (ref 12.0–15.0)
MCH: 21 pg — ABNORMAL LOW (ref 26.0–34.0)
MCHC: 28.7 g/dL — AB (ref 30.0–36.0)
MCV: 73.1 fL — AB (ref 78.0–100.0)
PLATELETS: 185 10*3/uL (ref 150–400)
RBC: 4.05 MIL/uL (ref 3.87–5.11)
RDW: 18.2 % — AB (ref 11.5–15.5)
WBC: 7.7 10*3/uL (ref 4.0–10.5)

## 2016-08-02 NOTE — Progress Notes (Signed)
Occupational Therapy Treatment Patient Details Name: [name] MRN: [id number] DOB: [birth year] Today's Date: 08/02/2016    History of present illness Pt presents for L TKA with PMH: DM, PE, A-fib   OT comments  This 56 yo female admitted and underwent above presents to acute OT with all OT education completed, we will D/C from acute OT.  Follow Up Recommendations  No OT follow up;Supervision/Assistance - 24 hour    Equipment Recommendations  3 in 1 bedside commode;Tub/shower bench       Precautions / Restrictions Precautions Precautions: Knee Required Braces or Orthoses: Knee Immobilizer - Left Restrictions Weight Bearing Restrictions: No LLE Weight Bearing: Weight bearing as tolerated       Mobility Bed Mobility               General bed mobility comments: pt up in chair   Transfers Overall transfer level: Needs assistance Equipment used: Rolling walker (2 wheeled) Transfers: Sit to/from Stand Sit to Stand: Min guard              Balance Overall balance assessment: Needs assistance Sitting-balance support: No upper extremity supported;Feet supported Sitting balance-Leahy Scale: Good     Standing balance support: Bilateral upper extremity supported Standing balance-Leahy Scale: Poor Standing balance comment: reliant UE support                           ADL either performed or assessed with clinical judgement   ADL Overall ADL's : Needs assistance/impaired                                 Tub/ Shower Transfer: Tub transfer;Minimal assistance;Ambulation;Rolling walker;Tub bench     General ADL Comments: Pt reports her husband will A her with LBADLs prn. She was concerned about getting in to her bathroom door due to narrow--I educated her on doing side steps with RW to get through door and then turning to go foreward or backward depending on what she was going to in bathroom. One of her bathrooms has a standard toilet and  the other one a taller toliet--recommended she use the one with the taller toilet then she may not need the 3n1 (since her insurance will not pay for one). Pt is to decide on whether she wants a tub bench and she will get one on her own if she decides too.     Vision Patient Visual Report: No change from baseline            Cognition Arousal/Alertness: Awake/alert Behavior During Therapy: WFL for tasks assessed/performed Overall Cognitive Status: Within Functional Limits for tasks assessed                                                     Pertinent Vitals/ Pain       Pain Assessment: 0-10 Pain Score: 3  Pain Location: left knee Pain Descriptors / Indicators: Aching;Sore;Operative site guarding Pain Intervention(s): Monitored during session;Repositioned         Frequency  Min 2X/week        Progress Toward Goals  OT Goals(current goals can now be found in the care plan section)  Progress towards OT goals:  (all education completed and DME needs addressed)  Plan Discharge plan remains appropriate       AM-PAC PT "6 Clicks" Daily Activity     Outcome Measure   Help from another person eating meals?: None Help from another person taking care of personal grooming?: A Little Help from another person toileting, which includes using toliet, bedpan, or urinal?: A Little Help from another person bathing (including washing, rinsing, drying)?: A Lot Help from another person to put on and taking off regular upper body clothing?: A Little Help from another person to put on and taking off regular lower body clothing?: A Lot 6 Click Score: 17    End of Session Equipment Utilized During Treatment: Rolling walker;Gait belt  OT Visit Diagnosis: Pain;Other abnormalities of gait and mobility (R26.89) Pain - Right/Left: Left Pain - part of body: Knee   Activity Tolerance Patient tolerated treatment well   Patient Left in chair   Nurse Communication           Time: 5364-6803 OT Time Calculation (min): 29 min  Charges: OT General Charges $OT Visit: 1 Procedure OT Treatments $Self Care/Home Management : 23-37 mins  [place], OTR/L [phone] 08/02/2016

## 2016-08-02 NOTE — Progress Notes (Signed)
Subjective: 2 Days Post-Op Procedure(s) (LRB): LEFT TOTAL KNEE ARTHROPLASTY (Left) Patient reports pain as moderate.  Hgb stable.  Objective: Vital signs in last 24 hours: Temp:  [98 F (36.7 C)-100.1 F (37.8 C)] 100.1 F (37.8 C) (06/28 0421) Pulse Rate:  [81-99] 93 (06/28 0421) Resp:  [16-18] 16 (06/28 0421) BP: (121-157)/(55-85) 126/65 (06/28 0421) SpO2:  [92 %-96 %] 94 % (06/28 0421)  Intake/Output from previous day: 06/27 0701 - 06/28 0700 In: 1659.5 [P.O.:952; I.V.:707.5] Out: -  Intake/Output this shift: Total I/O In: 120 [P.O.:120] Out: -    Recent Labs  08/01/16 0503 08/02/16 0636  HGB 8.6* 8.5*    Recent Labs  08/01/16 0503 08/02/16 0636  WBC 7.0 7.7  RBC 4.07 4.05  HCT 30.1* 29.6*  PLT 166 185    Recent Labs  08/01/16 0503  NA 139  K 3.8  CL 108  CO2 26  BUN 11  CREATININE 0.70  GLUCOSE 111*  CALCIUM 8.0*   No results for input(s): LABPT, INR in the last 72 hours.  Sensation intact distally Intact pulses distally Dorsiflexion/Plantar flexion intact Incision: scant drainage No cellulitis present Compartment soft  Assessment/Plan: 2 Days Post-Op Procedure(s) (LRB): LEFT TOTAL KNEE ARTHROPLASTY (Left) Up with therapy Plan for discharge tomorrow Discharge home with home health  [name] 08/02/2016, 9:44 AM

## 2016-08-02 NOTE — Progress Notes (Signed)
Physical Therapy Treatment Patient Details Name: [name] MRN: [id number] DOB: 08/27/1960 Today's Date: 08/02/2016    History of Present Illness Pt presents for L TKA with PMH: DM, PE, A-fib    PT Comments    Pt able to progress ambulation tolerance in afternoon session. Improved weightshifting to LLE with cues, and able to soften step on RLE. Continues to be limited by fatigue and required standing rests with ambulation. Current recommendations appropriate. Will continue to follow acutely to progress mobility according to pt tolerance.    Follow Up Recommendations  Home health PT;Supervision for mobility/OOB     Equipment Recommendations  Rolling walker with 5" wheels    Recommendations for Other Services       Precautions / Restrictions Precautions Precautions: Knee Precaution Booklet Issued: Yes (comment) Precaution Comments: Pt fatigued with ambulation, therefore did not review supine ther ex.   Required Braces or Orthoses: Knee Immobilizer - Left Restrictions Weight Bearing Restrictions: Yes LLE Weight Bearing: Weight bearing as tolerated    Mobility  Bed Mobility Overal bed mobility: Needs Assistance Bed Mobility: Sit to Supine       Sit to supine: Min assist   General bed mobility comments: Min A for LLE management.   Transfers Overall transfer level: Needs assistance Equipment used: Rolling walker (2 wheeled) Transfers: Sit to/from Stand Sit to Stand: Min guard         General transfer comment: Min guard for safety.   Ambulation/Gait Ambulation/Gait assistance: Min guard Ambulation Distance (Feet): 125 Feet Assistive device: Rolling walker (2 wheeled) Gait Pattern/deviations: Decreased weight shift to left;Antalgic;Step-to pattern;Decreased stance time - left;Decreased step length - right Gait velocity: decreased Gait velocity interpretation: Below normal speed for age/gender General Gait Details: Slow, antalgic gait. Cues for increasing  step length in RLE initially, however, able to increase step length independently by end of session. Verbal cues for upright posture. Pt able to soften landing on RLE in afternoon session. Pt limited by fatigue.    Stairs            Wheelchair Mobility    Modified Rankin (Stroke Patients Only)       Balance Overall balance assessment: Needs assistance Sitting-balance support: No upper extremity supported;Feet supported Sitting balance-Leahy Scale: Good     Standing balance support: Bilateral upper extremity supported Standing balance-Leahy Scale: Poor Standing balance comment: reliant UE support                            Cognition Arousal/Alertness: Awake/alert Behavior During Therapy: WFL for tasks assessed/performed Overall Cognitive Status: Within Functional Limits for tasks assessed                                        Exercises      General Comments General comments (skin integrity, edema, etc.): Pt with increased motivation in afternoon session, however, very fatigued after ambulation, therefore exercise deferred.       Pertinent Vitals/Pain Pain Assessment: Faces Faces Pain Scale: Hurts even more Pain Location: left knee Pain Descriptors / Indicators: Aching;Sore;Operative site guarding Pain Intervention(s): Limited activity within patient's tolerance;Monitored during session;Repositioned    Home Living                      Prior Function            PT  Goals (current goals can now be found in the care plan section) Acute Rehab PT Goals Patient Stated Goal: return home PT Goal Formulation: With patient Time For Goal Achievement: 08/08/16 Potential to Achieve Goals: Good Progress towards PT goals: Progressing toward goals    Frequency    7X/week      PT Plan Current plan remains appropriate    Co-evaluation              AM-PAC PT "6 Clicks" Daily Activity  Outcome Measure  Difficulty  turning over in bed (including adjusting bedclothes, sheets and blankets)?: A Lot Difficulty moving from lying on back to sitting on the side of the bed? : A Lot Difficulty sitting down on and standing up from a chair with arms (e.g., wheelchair, bedside commode, etc,.)?: A Lot Help needed moving to and from a bed to chair (including a wheelchair)?: A Little Help needed walking in hospital room?: A Little Help needed climbing 3-5 steps with a railing? : A Lot 6 Click Score: 14    End of Session Equipment Utilized During Treatment: Gait belt Activity Tolerance: Patient limited by fatigue Patient left: with call bell/phone within reach;in bed;in CPM;with family/visitor present Nurse Communication: Mobility status PT Visit Diagnosis: Pain;Difficulty in walking, not elsewhere classified (R26.2) Pain - Right/Left: Left Pain - part of body: Knee     Time: 6301-6010 PT Time Calculation (min) (ACUTE ONLY): 21 min  Charges:  $Gait Training: 8-22 mins                    G Codes:       [name], PT, DPT  Acute Rehabilitation Services  Pager: [phone]    [name] 08/02/2016, 5:44 PM

## 2016-08-02 NOTE — Progress Notes (Signed)
Physical Therapy Treatment Patient Details Name: [name] MRN: [id number] DOB: 08/08/1960 Today's Date: 08/02/2016    History of Present Illness Pt presents for L TKA with PMH: DM, PE, A-fib    PT Comments    Pt presents with flat demeanor on arrival with cell phone in hand.  Pt agreeable to supine exercises and PT session but appears distracted due to her cell phone.  Pt required constant cueing for redirection during session.  Pt completed exercises and required max VCs for gait training.  After 20 ft of gait training patient became agitated and turn around to advance back to recliner.  Will f/u in pm to progress mobility with a different therapist.  Plan to return home remains appropriate.      Follow Up Recommendations  Home health PT;Supervision for mobility/OOB     Equipment Recommendations  Rolling walker with 5" wheels    Recommendations for Other Services       Precautions / Restrictions Precautions Precautions: Knee Precaution Booklet Issued: Yes (comment) Precaution Comments: reviewed proper positioning of knee  Required Braces or Orthoses: Knee Immobilizer - Left Restrictions Weight Bearing Restrictions: No LLE Weight Bearing: Weight bearing as tolerated    Mobility  Bed Mobility               General bed mobility comments: pt up in chair   Transfers Overall transfer level: Needs assistance Equipment used: Rolling walker (2 wheeled) Transfers: Sit to/from Stand Sit to Stand: Min guard Stand pivot transfers: Min guard       General transfer comment: Cues for hand placement to and from seated surface.  Pt required cues to advance LLE to reduce pain and improve ease of transfer.    Ambulation/Gait Ambulation/Gait assistance: Min guard Ambulation Distance (Feet): 40 Feet Assistive device: Rolling walker (2 wheeled) Gait Pattern/deviations: Decreased weight shift to left;Antalgic;Step-to pattern;Decreased stance time - left;Decreased step  length - right Gait velocity: decreased Gait velocity interpretation: Below normal speed for age/gender General Gait Details: Pt required cues for weight shifting to L to improve stride length on R.  Cues for softening landing on R when stepping down.  Pt became agitated with cueing to correct gaot deviation and began to turn RW around and walk back to chair.  Pt is not very receptive to cueing and seems unmotivated to progress mobility.     Stairs            Wheelchair Mobility    Modified Rankin (Stroke Patients Only)       Balance Overall balance assessment: Needs assistance Sitting-balance support: No upper extremity supported;Feet supported Sitting balance-Leahy Scale: Good     Standing balance support: Bilateral upper extremity supported Standing balance-Leahy Scale: Poor Standing balance comment: reliant UE support                            Cognition Arousal/Alertness: Awake/alert Behavior During Therapy: Agitated;Flat affect Overall Cognitive Status: Within Functional Limits for tasks assessed                                 General Comments: Pt agitated with progression of mobility and exercise during treatment and refused further gait distance.        Exercises Total Joint Exercises Ankle Circles/Pumps: AROM;Both;10 reps;Supine Quad Sets: AROM;10 reps;Supine;Left Towel Squeeze: AROM;Both;10 reps;Supine Short Arc Quad: AROM;Left;10 reps;Supine Heel Slides: Left;10 reps;AAROM;Supine Hip  ABduction/ADduction: AAROM;Left;10 reps;Supine Straight Leg Raises: AROM;Left;10 reps;Supine Goniometric ROM: 69 degrees flexion in L knee, refusing further flexion.      General Comments        Pertinent Vitals/Pain Pain Assessment: 0-10 Pain Score: 3  Pain Location: left knee Pain Descriptors / Indicators: Aching;Sore;Operative site guarding Pain Intervention(s): Monitored during session;Repositioned    Home Living                       Prior Function            PT Goals (current goals can now be found in the care plan section) Acute Rehab PT Goals Potential to Achieve Goals: Good Progress towards PT goals: Not progressing toward goals - comment (due to behavior)    Frequency    7X/week      PT Plan Current plan remains appropriate    Co-evaluation              AM-PAC PT "6 Clicks" Daily Activity  Outcome Measure  Difficulty turning over in bed (including adjusting bedclothes, sheets and blankets)?: A Lot Difficulty moving from lying on back to sitting on the side of the bed? : A Lot Difficulty sitting down on and standing up from a chair with arms (e.g., wheelchair, bedside commode, etc,.)?: A Lot Help needed moving to and from a bed to chair (including a wheelchair)?: A Little Help needed walking in hospital room?: A Little Help needed climbing 3-5 steps with a railing? : A Lot 6 Click Score: 14    End of Session Equipment Utilized During Treatment: Gait belt Activity Tolerance: Patient tolerated treatment well Patient left: with call bell/phone within reach;in bed;in CPM;with family/visitor present Nurse Communication: Mobility status PT Visit Diagnosis: Pain;Difficulty in walking, not elsewhere classified (R26.2) Pain - Right/Left: Left Pain - part of body: Knee     Time: 6578-4696 PT Time Calculation (min) (ACUTE ONLY): 16 min  Charges:  $Therapeutic Exercise: 8-22 mins                    G Codes:       Governor Rooks, PTA pager [place] 08/02/2016, 1:22 PM

## 2016-08-03 MED ORDER — OXYCODONE-ACETAMINOPHEN 5-325 MG PO TABS: 1.0000 | ORAL_TABLET | ORAL | 0 refills | Status: DC | PRN

## 2016-08-03 MED ORDER — METHOCARBAMOL 500 MG PO TABS: 500.0000 mg | ORAL_TABLET | Freq: Four times a day (QID) | ORAL | 0 refills | Status: DC | PRN

## 2016-08-03 NOTE — Progress Notes (Signed)
Patient ID: [name], female   DOB: Jun 13, 1960, 56 y.o.   MRN: [id number] Doing well.  No acute changes.  Can be discharged to home today.

## 2016-08-03 NOTE — Progress Notes (Signed)
Physical Therapy Treatment Patient Details Name: [name] MRN: [id number] DOB: Jul 18, 1960 Today's Date: 08/03/2016    History of Present Illness Pt presents for L TKA with PMH: DM, PE, A-fib    PT Comments    Pt progressing towards goals. Increased ambulation tolerance this session, however, continues to be limited by fatigued. Practiced stair training this session and pt required min guard assist. Pt reports she feels comfortable with mobility at home. Current recommendations appropriate. Will continue to follow acutely to maximize functional mobility independence.    Follow Up Recommendations  Home health PT;Supervision for mobility/OOB     Equipment Recommendations  Rolling walker with 5" wheels    Recommendations for Other Services       Precautions / Restrictions Precautions Precautions: Knee Precaution Booklet Issued: Yes (comment) Precaution Comments: Asked if pt wanted to review exercise and pt refused secondary to fatigue.  Required Braces or Orthoses: Knee Immobilizer - Left Restrictions Weight Bearing Restrictions: Yes LLE Weight Bearing: Weight bearing as tolerated    Mobility  Bed Mobility               General bed mobility comments: Pt in chair upon entry   Transfers Overall transfer level: Needs assistance Equipment used: Rolling walker (2 wheeled) Transfers: Sit to/from Stand Sit to Stand: Min guard         General transfer comment: Min guard for safety.   Ambulation/Gait Ambulation/Gait assistance: Min guard Ambulation Distance (Feet): 150 Feet Assistive device: Rolling walker (2 wheeled) Gait Pattern/deviations: Decreased weight shift to left;Antalgic;Step-to pattern;Decreased stance time - left;Decreased step length - right;Trunk flexed Gait velocity: decreased Gait velocity interpretation: Below normal speed for age/gender General Gait Details: Slow, antalgic gait. Improved weightshift on LLE and improved step length of RLE.  Required cues for upright posture. Cues to relax shoulders as well during ambulation with RW. Required standing rests X 3 secondary to fatigue.    Stairs Stairs: Yes   Stair Management: Two rails;One rail Left;Step to pattern;Forwards Number of Stairs: 2 General stair comments: Practiced stair navigation with two rails and one rail to simulate home environment. Required use of two rails for ascending. Educated about use of one rail, however, pt refused practiced. Practiced using BUE on one rail for descending steps. Min guard for safety. Educated about [place].   Wheelchair Mobility    Modified Rankin (Stroke Patients Only)       Balance Overall balance assessment: Needs assistance Sitting-balance support: No upper extremity supported;Feet supported Sitting balance-Leahy Scale: Good     Standing balance support: Bilateral upper extremity supported Standing balance-Leahy Scale: Poor Standing balance comment: reliant UE support                            Cognition Arousal/Alertness: Awake/alert Behavior During Therapy: WFL for tasks assessed/performed Overall Cognitive Status: Within Functional Limits for tasks assessed                                        Exercises      General Comments General comments (skin integrity, edema, etc.): Pt's husband present throughout. Refused exercise secondary to fatigue.       Pertinent Vitals/Pain Pain Assessment: Faces Faces Pain Scale: Hurts even more Pain Location: left knee Pain Descriptors / Indicators: Aching;Sore;Operative site guarding Pain Intervention(s): Limited activity within patient's tolerance;Monitored during session;Repositioned  Home Living                      Prior Function            PT Goals (current goals can now be found in the care plan section) Acute Rehab PT Goals Patient Stated Goal: return home PT Goal Formulation: With patient Time For Goal  Achievement: 08/08/16 Potential to Achieve Goals: Good Progress towards PT goals: Progressing toward goals    Frequency    7X/week      PT Plan Current plan remains appropriate    Co-evaluation              AM-PAC PT "6 Clicks" Daily Activity  Outcome Measure  Difficulty turning over in bed (including adjusting bedclothes, sheets and blankets)?: A Lot Difficulty moving from lying on back to sitting on the side of the bed? : A Lot Difficulty sitting down on and standing up from a chair with arms (e.g., wheelchair, bedside commode, etc,.)?: A Lot Help needed moving to and from a bed to chair (including a wheelchair)?: A Little Help needed walking in hospital room?: A Little Help needed climbing 3-5 steps with a railing? : A Little 6 Click Score: 15    End of Session Equipment Utilized During Treatment: Gait belt;Left knee immobilizer Activity Tolerance: Patient limited by fatigue Patient left: with call bell/phone within reach;in bed;with family/visitor present Nurse Communication: Mobility status PT Visit Diagnosis: Pain;Difficulty in walking, not elsewhere classified (R26.2) Pain - Right/Left: Left Pain - part of body: Knee     Time: 1191-4782 PT Time Calculation (min) (ACUTE ONLY): 18 min  Charges:  $Gait Training: 8-22 mins                    G Codes:       Leighton Ruff, PT, DPT  Acute Rehabilitation Services  Pager: [place] 08/03/2016, 1:05 PM

## 2016-08-03 NOTE — Progress Notes (Signed)
Discharged patient to home. After patient received rolling walker and spoke with case manager. Written prescriptions given for new medications. Teach back done with patient concerning written and discharge instructions. Left unit via wheelchair, accompanied by spouse and transportation technician

## 2016-08-03 NOTE — Care Management Note (Signed)
Case Management Note  Patient Details  Name: [name] MRN: [id number] Date of Birth: 1960/10/19  Subjective/Objective:    56 yr old female s/p left total knee arthroplasty.                Action/Plan: Case manager spoke with patient and her husband concerning discharge plan and DME needs. Patient was preoperatively setup with [facility], no changes. She has ordered 3in1 from [place] has ordered a RW  Through [facility]. Patient will have family support at discharge.    Expected Discharge Date:  08/03/16               Expected Discharge Plan:  (P) [place]  In-House Referral:  (P) NA  Discharge planning Services  (P) CM Consult  Post Acute Care Choice:  (P) Durable Medical Equipment, Home Health Choice offered to:  (P) Patient, Spouse  DME Arranged:  (P) Walker rolling DME Agency:     HH Arranged:  (P) PT HH Agency:  (P) [place]  Status of Service:     If discussed at [place] of Stay Meetings, dates discussed:    Additional Comments:  Ninfa Meeker, RN 08/03/2016, 11:42 AM

## 2016-08-03 NOTE — Discharge Summary (Signed)
Patient ID: [name] MRN: [id number] DOB/AGE: 57-May-1962 57 y.o.  Admit date: 07/31/2016 Discharge date: 08/03/2016  Admission Diagnoses:  Principal Problem:   Unilateral primary osteoarthritis, left knee Active Problems:   Status post total knee replacement, left   Discharge Diagnoses:  Same  Past Medical History:  Diagnosis Date  . Anemia   . Arthritis   . Atrial fibrillation ([place])   . Bleeding ulcer 12-2010  hospitalized for 1 week  . Blood transfusion   . Blood transfusion without reported diagnosis    2011-2 units transfused, bleeding ulcer  . Bruises easily   . CHF (congestive heart failure) ([place])    with PE resolved after   . Constipation   . Difficulty in swallowing 03-24-13   no longer a problem  . DVT (deep venous thrombosis) ([place]) 06-2013   right leg  . Dysrhythmia     Hx of A fib   . GERD (gastroesophageal reflux disease)   . Hyperlipidemia   . Leg swelling 03-24-13   not a problem any longer  . Peripheral vascular disease ([place])   . Pulmonary embolus ([place]) 06-2013  . Reflux   . Shortness of breath    with PE  . Vomiting 03-24-13   problem resolved after lap band revision    Surgeries: Procedure(s): LEFT TOTAL KNEE ARTHROPLASTY on 07/31/2016   Consultants:   Discharged Condition: Improved  Hospital Course: [name] is an 56 y.o. female who was admitted 07/31/2016 for operative treatment ofUnilateral primary osteoarthritis, left knee. Patient has severe unremitting pain that affects sleep, daily activities, and work/hobbies. After pre-op clearance the patient was taken to the operating room on 07/31/2016 and underwent  Procedure(s): LEFT TOTAL KNEE ARTHROPLASTY.    Patient was given perioperative antibiotics: Anti-infectives    Start     Dose/Rate Route Frequency Ordered Stop   07/31/16 2100  ceFAZolin (ANCEF) IVPB 2g/100 mL premix     2 g 200 mL/hr over 30 Minutes Intravenous Every 6 hours 07/31/16 1821 08/01/16 0347   07/31/16 1330   clindamycin (CLEOCIN) IVPB 900 mg  Status:  Discontinued     900 mg 100 mL/hr over 30 Minutes Intravenous To ShortStay Surgical 07/30/16 1119 07/31/16 1201   07/31/16 1200  ceFAZolin (ANCEF) 3 g in dextrose 5 % 50 mL IVPB     3 g 130 mL/hr over 30 Minutes Intravenous  Once 07/31/16 1201 07/31/16 1518       Patient was given sequential compression devices, early ambulation, and chemoprophylaxis to prevent DVT.  Patient benefited maximally from hospital stay and there were no complications.    Recent vital signs: Patient Vitals for the past 24 hrs:  BP Temp Temp src Pulse Resp SpO2  08/03/16 0503 (!) 106/48 99 F (37.2 C) Oral 81 18 90 %  08/02/16 2102 124/64 (!) 102 F (38.9 C) Oral 99 18 90 %  08/02/16 1417 128/72 98.9 F (37.2 C) Oral 86 - 94 %     Recent laboratory studies:  Recent Labs  08/01/16 0503 08/02/16 0636  WBC 7.0 7.7  HGB 8.6* 8.5*  HCT 30.1* 29.6*  PLT 166 185  NA 139  --   K 3.8  --   CL 108  --   CO2 26  --   BUN 11  --   CREATININE 0.70  --   GLUCOSE 111*  --   CALCIUM 8.0*  --      Discharge Medications:   Allergies as of 08/03/2016  Reactions   Contrast Media [iodinated Diagnostic Agents] Shortness Of Breath, Other (See Comments)   APNEA   Penicillins Itching, Rash   PATIENT HAD A PCN REACTION WITH IMMEDIATE RASH, FACIAL/TONGUE/THROAT SWELLING, SOB, OR LIGHTHEADEDNESS WITH HYPOTENSION:  #  #  #  YES  #  #  #   Has patient had a PCN reaction causing severe rash involving mucus membranes or skin necrosis: NO Has patient had a PCN reaction that required hospitalization NO Has patient had a PCN reaction occurring within the last 10 years: NO   Ancef [cefazolin Sodium] Nausea And Vomiting   Tolerates IV      Medication List    TAKE these medications   acetaminophen 325 MG tablet Commonly known as:  TYLENOL Take 650 mg by mouth every 6 (six) hours as needed for mild pain or moderate pain.   b complex vitamins tablet Take 1 tablet by  mouth daily.   Biotin 10000 MCG Tabs Take by mouth daily.   hydroxychloroquine 200 MG tablet Commonly known as:  PLAQUENIL Take 200 mg by mouth 2 (two) times daily.   LINZESS 145 MCG Caps capsule Generic drug:  linaclotide Take 145 mcg by mouth daily as needed (for constipation).   methocarbamol 500 MG tablet Commonly known as:  ROBAXIN Take 1 tablet (500 mg total) by mouth every 6 (six) hours as needed for muscle spasms.   metoprolol tartrate 25 MG tablet Commonly known as:  LOPRESSOR Take 0.5 tablets (12.5 mg total) by mouth 2 (two) times daily.   multivitamins ther. w/minerals Tabs tablet Take 1 tablet by mouth daily.   omeprazole 20 MG capsule Commonly known as:  PRILOSEC Take 20 mg by mouth 2 (two) times daily before a meal.   oxyCODONE-acetaminophen 5-325 MG tablet Commonly known as:  ROXICET Take 1-2 tablets by mouth every 4 (four) hours as needed.   rivaroxaban 20 MG Tabs tablet Commonly known as:  XARELTO Take 20 mg by mouth daily.   VITAMIN C PO Take 1 tablet by mouth daily.   VITAMIN D HIGH POTENCY 1000 units capsule Generic drug:  Cholecalciferol Take 5,000 Units by mouth daily.            Durable Medical Equipment        Start     Ordered   07/31/16 1822  DME Walker rolling  Once    Question:  Patient needs a walker to treat with the following condition  Answer:  Status post total knee replacement, left   07/31/16 1821   07/31/16 1822  DME 3 n 1  Once     07/31/16 1821      Diagnostic Studies: Dg Knee Left Port  Result Date: 07/31/2016 CLINICAL DATA:  56 year old female post left knee replacement. Subsequent encounter. EXAM: PORTABLE LEFT KNEE - 1-2 VIEW COMPARISON:  06/26/2016. FINDINGS: Post total left knee replacement. Frontal view slightly rotated. No complication noted. IMPRESSION: Post total left knee replacement. Electronically Signed   By: Genia Del M.D.   On: 07/31/2016 17:51    Disposition: 01-Home or Self Care  Discharge  Instructions    Discharge patient    Complete by:  As directed    Discharge disposition:  01-Home or Self Care   Discharge patient date:  08/03/2016      Follow-up Information    [name], MD Follow up in 2 week(s).   Specialty:  Orthopedic Surgery Contact information: [place] [address] [phone]  Signed: [name] 08/03/2016, 6:59 AM

## 2016-08-03 NOTE — Progress Notes (Deleted)
Discharged to home

## 2016-08-06 DIAGNOSIS — R269 Unspecified abnormalities of gait and mobility: Secondary | ICD-10-CM | POA: Diagnosis not present

## 2016-08-07 DIAGNOSIS — E1121 Type 2 diabetes mellitus with diabetic nephropathy: Secondary | ICD-10-CM | POA: Diagnosis not present

## 2016-08-07 DIAGNOSIS — Z471 Aftercare following joint replacement surgery: Secondary | ICD-10-CM | POA: Diagnosis not present

## 2016-08-07 DIAGNOSIS — E1151 Type 2 diabetes mellitus with diabetic peripheral angiopathy without gangrene: Secondary | ICD-10-CM | POA: Diagnosis not present

## 2016-08-10 DIAGNOSIS — Z471 Aftercare following joint replacement surgery: Secondary | ICD-10-CM | POA: Diagnosis not present

## 2016-08-10 DIAGNOSIS — E1121 Type 2 diabetes mellitus with diabetic nephropathy: Secondary | ICD-10-CM | POA: Diagnosis not present

## 2016-08-10 DIAGNOSIS — E1151 Type 2 diabetes mellitus with diabetic peripheral angiopathy without gangrene: Secondary | ICD-10-CM | POA: Diagnosis not present

## 2016-08-13 DIAGNOSIS — Z471 Aftercare following joint replacement surgery: Secondary | ICD-10-CM | POA: Diagnosis not present

## 2016-08-13 DIAGNOSIS — E1121 Type 2 diabetes mellitus with diabetic nephropathy: Secondary | ICD-10-CM | POA: Diagnosis not present

## 2016-08-13 DIAGNOSIS — E1151 Type 2 diabetes mellitus with diabetic peripheral angiopathy without gangrene: Secondary | ICD-10-CM | POA: Diagnosis not present

## 2016-08-14 ENCOUNTER — Ambulatory Visit (INDEPENDENT_AMBULATORY_CARE_PROVIDER_SITE_OTHER): Payer: 59 | Admitting: Physician Assistant

## 2016-08-14 ENCOUNTER — Encounter (INDEPENDENT_AMBULATORY_CARE_PROVIDER_SITE_OTHER): Payer: Self-pay | Admitting: Orthopaedic Surgery

## 2016-08-14 DIAGNOSIS — Z96652 Presence of left artificial knee joint: Secondary | ICD-10-CM

## 2016-08-14 MED ORDER — OXYCODONE-ACETAMINOPHEN 5-325 MG PO TABS: 1.0000 | ORAL_TABLET | ORAL | 0 refills | Status: DC | PRN

## 2016-08-14 NOTE — Progress Notes (Signed)
Post-Op Visit Note   Patient: [name]           Date of Birth: [birth year]           MRN: [id number] Visit Date: 08/14/2016 PCP: Mateo Flow, MD   Assessment & Plan:  2 weeks status post left total knee arthroplasty    Scar tissue mobilization encouraged. Form for outpatient therapy at [facility] therapy was given she will set this up to  begin as soon as home health therapy ends. Refill of Percocet was given. She'll follow with [place] in 1 month for  reevaluation sooner if there is any questions or concerns    Chief Complaint:  Chief Complaint  Patient presents with  . Left Knee - Routine Post Op   Visit Diagnoses:  1. Presence of left artificial knee joint      Follow-Up Instructions: Return in about 4 weeks (around 09/11/2016).   History of present illness: [name] is now 2 weeks status post left total knee arthroplasty. She states that she is having a lot of pain in the knee. She is in a crutch to ambulate. Home health therapy was 0-90 for therapy. She's been on Xarelto for DVT prophylaxis.  Physical exam: Left knee she has full extension flexion to 90. Calf is supple with minimal discomfort. Surgical incisions healing well no signs of infection.  Orders:  No orders of the defined types were placed in this encounter.  Meds ordered this encounter  Medications  . oxyCODONE-acetaminophen (ROXICET) 5-325 MG tablet    Sig: Take 1-2 tablets by mouth every 4 (four) hours as needed.    Dispense:  60 tablet    Refill:  0    Imaging: No results found.  PMFS History: Patient Active Problem List   Diagnosis Date Noted  . Presence of left artificial knee joint 08/14/2016  . Unilateral primary osteoarthritis, left knee 07/31/2016  . Status post total knee replacement, left 07/31/2016  . Abdominal hematoma   . Post-operative state   . Paroxysmal atrial fibrillation (H[facility])   . Controlled type 2 diabetes mellitus with diabetic nephropathy ([place])   . Wound infection  01/25/2016  . Hydronephrosis, left 01/25/2016  . Hydronephrosis, right 01/25/2016  . DVT (deep venous thrombosis) ([place]) 01/25/2016  . Atrial fibrillation ([place]) 01/25/2016  . Diabetes mellitus with complication ([place])   . Wound infection after surgery, initial encounter   . Varicose veins of lower extremities with complications 20/25/4270  . Pain and swelling of left lower leg 10/27/2013  . Pain of left lower extremity 10/27/2013  . Gastroenteritis, acute 09/08/2013  . Fever, unspecified 09/04/2013  . ASD (atrial septal defect) 08/13/2013  . Pulmonary embolism ([place]) 06/22/2013  . Dyspnea 06/11/2013  . Anterior slip repaired Oct 2012 01/18/2011  . GI bleed 12/18/2010  . Diabetes mellitus ([place]) 12/18/2010  . Varicose veins of lower extremities with other complications 62/37/6283  . Diabetes mellitus   . HIP PAIN, RIGHT 03/06/2010   Past Medical History:  Diagnosis Date  . Anemia   . Arthritis   . Atrial fibrillation ([place])   . Bleeding ulcer 12-2010  hospitalized for 1 week  . Blood transfusion   . Blood transfusion without reported diagnosis    2011-2 units transfused, bleeding ulcer  . Bruises easily   . CHF (congestive heart failure) ([place])    with PE resolved after   . Constipation   . Difficulty in swallowing 03-24-13   no longer a problem  . DVT (  deep venous thrombosis) ([place]) 06-2013   right leg  . Dysrhythmia     Hx of A fib   . GERD (gastroesophageal reflux disease)   . Hyperlipidemia   . Leg swelling 03-24-13   not a problem any longer  . Peripheral vascular disease ([place])   . Pulmonary embolus ([place]) 06-2013  . Reflux   . Shortness of breath    with PE  . Vomiting 03-24-13   problem resolved after lap band revision    Family History  Problem Relation Age of Onset  . Heart disease Father   . Kidney disease Father   . Peripheral vascular disease Father   . Heart attack Father        25  . Diabetes Mother   . Arthritis Mother     Past Surgical History:    Procedure Laterality Date  . APPENDECTOMY    . CARPEL TUNNEL BILATEREAL    . CHOLECYSTECTOMY     5'12  . COLONOSCOPY N/A 04/10/2013   Procedure: COLONOSCOPY;  Surgeon: Beryle Beams, MD;  Location: [facility];  Service: Endoscopy;  Laterality: N/A;  . DIAGNOSTIC LAPAROSCOPY    . ENDOVENOUS ABLATION SAPHENOUS VEIN W/ LASER  06-2008  . ENDOVENOUS ABLATION SAPHENOUS VEIN W/ LASER Left 12-09-2013   EVLA LEFT GREATER SAPHENOUS VEIN BY TODD EARLY MD  . ESOPHAGOGASTRODUODENOSCOPY  12/18/2010   Procedure: ESOPHAGOGASTRODUODENOSCOPY (EGD);  Surgeon: Gatha Mayer, MD;  Location: [facility];  Service: Endoscopy;  Laterality: N/A;  . LAPAROSCOPIC GASTRIC BANDING  01-05-2008  . LAPAROSCOPIC GASTRIC BANDING  12/18/2010   01/04/2009  . SALPHINGOOOPHORECTOMY Right    cyst removal  . TONSILLECTOMY    . TOTAL KNEE ARTHROPLASTY Left 07/31/2016   Procedure: LEFT TOTAL KNEE ARTHROPLASTY;  Surgeon: [name], MD;  Location: [facility];  Service: Orthopedics;  Laterality: Left;  . WEDGE RESECTION OF OVARY  LEFT OVARY    Social History   Occupational History  . RN [employer]   Social History Main Topics  . Smoking status: Never Smoker  . Smokeless tobacco: Never Used  . Alcohol use No  . Drug use: No  . Sexual activity: Yes    Partners: Male    Birth control/ protection: None     Comment: declined condoms

## 2016-08-16 DIAGNOSIS — E1121 Type 2 diabetes mellitus with diabetic nephropathy: Secondary | ICD-10-CM | POA: Diagnosis not present

## 2016-08-16 DIAGNOSIS — E1151 Type 2 diabetes mellitus with diabetic peripheral angiopathy without gangrene: Secondary | ICD-10-CM | POA: Diagnosis not present

## 2016-08-16 DIAGNOSIS — Z471 Aftercare following joint replacement surgery: Secondary | ICD-10-CM | POA: Diagnosis not present

## 2016-08-20 ENCOUNTER — Telehealth (INDEPENDENT_AMBULATORY_CARE_PROVIDER_SITE_OTHER): Payer: Self-pay | Admitting: Orthopaedic Surgery

## 2016-08-20 NOTE — Telephone Encounter (Signed)
Butch Penny from [facility] called asking for the op notes and referral to be sent over to [phone].

## 2016-08-21 ENCOUNTER — Other Ambulatory Visit (INDEPENDENT_AMBULATORY_CARE_PROVIDER_SITE_OTHER): Payer: Self-pay

## 2016-08-21 DIAGNOSIS — Z96659 Presence of unspecified artificial knee joint: Secondary | ICD-10-CM

## 2016-08-21 DIAGNOSIS — R262 Difficulty in walking, not elsewhere classified: Secondary | ICD-10-CM | POA: Diagnosis not present

## 2016-08-21 DIAGNOSIS — M25562 Pain in left knee: Secondary | ICD-10-CM | POA: Diagnosis not present

## 2016-08-21 NOTE — Telephone Encounter (Signed)
Faxed both to number provided

## 2016-08-23 DIAGNOSIS — M25562 Pain in left knee: Secondary | ICD-10-CM | POA: Diagnosis not present

## 2016-08-23 DIAGNOSIS — R262 Difficulty in walking, not elsewhere classified: Secondary | ICD-10-CM | POA: Diagnosis not present

## 2016-08-27 DIAGNOSIS — R262 Difficulty in walking, not elsewhere classified: Secondary | ICD-10-CM | POA: Diagnosis not present

## 2016-08-27 DIAGNOSIS — M25562 Pain in left knee: Secondary | ICD-10-CM | POA: Diagnosis not present

## 2016-08-29 DIAGNOSIS — R262 Difficulty in walking, not elsewhere classified: Secondary | ICD-10-CM | POA: Diagnosis not present

## 2016-08-29 DIAGNOSIS — M25562 Pain in left knee: Secondary | ICD-10-CM | POA: Diagnosis not present

## 2016-08-30 ENCOUNTER — Telehealth (INDEPENDENT_AMBULATORY_CARE_PROVIDER_SITE_OTHER): Payer: Self-pay | Admitting: Orthopaedic Surgery

## 2016-08-30 ENCOUNTER — Emergency Department (HOSPITAL_COMMUNITY): Payer: 59

## 2016-08-30 ENCOUNTER — Emergency Department (HOSPITAL_COMMUNITY)
Admission: EM | Admit: 2016-08-30 | Discharge: 2016-08-31 | Disposition: A | Discharge status: Home / Self Care | Payer: 59 | Attending: Emergency Medicine | Admitting: Emergency Medicine

## 2016-08-30 ENCOUNTER — Encounter (HOSPITAL_COMMUNITY): Payer: Self-pay

## 2016-08-30 ENCOUNTER — Emergency Department (HOSPITAL_BASED_OUTPATIENT_CLINIC_OR_DEPARTMENT_OTHER)
Admit: 2016-08-30 | Discharge: 2016-08-30 | Disposition: A | Discharge status: Home / Self Care | Payer: 59 | Attending: Emergency Medicine | Admitting: Emergency Medicine

## 2016-08-30 DIAGNOSIS — Z7901 Long term (current) use of anticoagulants: Secondary | ICD-10-CM | POA: Diagnosis not present

## 2016-08-30 DIAGNOSIS — Z8679 Personal history of other diseases of the circulatory system: Secondary | ICD-10-CM | POA: Insufficient documentation

## 2016-08-30 DIAGNOSIS — R0602 Shortness of breath: Secondary | ICD-10-CM | POA: Insufficient documentation

## 2016-08-30 DIAGNOSIS — Z86711 Personal history of pulmonary embolism: Secondary | ICD-10-CM | POA: Diagnosis not present

## 2016-08-30 DIAGNOSIS — E1151 Type 2 diabetes mellitus with diabetic peripheral angiopathy without gangrene: Secondary | ICD-10-CM | POA: Diagnosis not present

## 2016-08-30 DIAGNOSIS — I509 Heart failure, unspecified: Secondary | ICD-10-CM | POA: Diagnosis not present

## 2016-08-30 DIAGNOSIS — Z86718 Personal history of other venous thrombosis and embolism: Secondary | ICD-10-CM | POA: Diagnosis not present

## 2016-08-30 DIAGNOSIS — Z79899 Other long term (current) drug therapy: Secondary | ICD-10-CM | POA: Diagnosis not present

## 2016-08-30 LAB — CBC WITH DIFFERENTIAL/PLATELET
BASOS ABS: 0 10*3/uL (ref 0.0–0.1)
Basophils Relative: 1 %
EOS ABS: 0.2 10*3/uL (ref 0.0–0.7)
Eosinophils Relative: 5 %
HCT: 33.4 % — ABNORMAL LOW (ref 36.0–46.0)
HEMOGLOBIN: 9.6 g/dL — AB (ref 12.0–15.0)
LYMPHS ABS: 1.6 10*3/uL (ref 0.7–4.0)
LYMPHS PCT: 39 %
MCH: 21 pg — ABNORMAL LOW (ref 26.0–34.0)
MCHC: 28.7 g/dL — AB (ref 30.0–36.0)
MCV: 72.9 fL — ABNORMAL LOW (ref 78.0–100.0)
Monocytes Absolute: 0.2 10*3/uL (ref 0.1–1.0)
Monocytes Relative: 5 %
NEUTROS ABS: 2.1 10*3/uL (ref 1.7–7.7)
Neutrophils Relative %: 50 %
Platelets: 203 10*3/uL (ref 150–400)
RBC: 4.58 MIL/uL (ref 3.87–5.11)
RDW: 19 % — AB (ref 11.5–15.5)
WBC: 4.1 10*3/uL (ref 4.0–10.5)

## 2016-08-30 LAB — I-STAT TROPONIN, ED
TROPONIN I, POC: 0 ng/mL (ref 0.00–0.08)
Troponin i, poc: 0 ng/mL (ref 0.00–0.08)

## 2016-08-30 LAB — PROTIME-INR
INR: 1.59
PROTHROMBIN TIME: 19.1 s — AB (ref 11.4–15.2)

## 2016-08-30 LAB — COMPREHENSIVE METABOLIC PANEL
ALBUMIN: 3.8 g/dL (ref 3.5–5.0)
ALK PHOS: 69 U/L (ref 38–126)
ALT: 12 U/L — AB (ref 14–54)
AST: 18 U/L (ref 15–41)
Anion gap: 8 (ref 5–15)
BILIRUBIN TOTAL: 0.4 mg/dL (ref 0.3–1.2)
BUN: 12 mg/dL (ref 6–20)
CALCIUM: 9.2 mg/dL (ref 8.9–10.3)
CO2: 25 mmol/L (ref 22–32)
CREATININE: 0.63 mg/dL (ref 0.44–1.00)
Chloride: 108 mmol/L (ref 101–111)
GFR calc Af Amer: >60 mL/min (ref >60)
GFR calc non Af Amer: >60 mL/min (ref >60)
GLUCOSE: 96 mg/dL (ref 65–99)
Potassium: 3.7 mmol/L (ref 3.5–5.1)
SODIUM: 141 mmol/L (ref 135–145)
TOTAL PROTEIN: 6.8 g/dL (ref 6.5–8.1)

## 2016-08-30 LAB — BRAIN NATRIURETIC PEPTIDE: B Natriuretic Peptide: 18.5 pg/mL (ref 0.0–100.0)

## 2016-08-30 MED ORDER — IOPAMIDOL (ISOVUE-370) INJECTION 76%
INTRAVENOUS | Status: AC
  Administered 2016-08-31: 100 mL
  Filled 2016-08-30: qty 100

## 2016-08-30 MED ORDER — HYDROCORTISONE NA SUCCINATE PF 250 MG IJ SOLR
200.0000 mg | Freq: Once | INTRAMUSCULAR | Status: AC
  Administered 2016-08-30: 200 mg via INTRAVENOUS
  Filled 2016-08-30: qty 200

## 2016-08-30 MED ORDER — DIPHENHYDRAMINE HCL 50 MG/ML IJ SOLN
50.0000 mg | Freq: Once | INTRAMUSCULAR | Status: AC
  Administered 2016-08-31: 50 mg via INTRAVENOUS
  Filled 2016-08-30: qty 1

## 2016-08-30 MED ORDER — DIPHENHYDRAMINE HCL 25 MG PO CAPS: 50.0000 mg | ORAL_CAPSULE | Freq: Once | ORAL | Status: AC

## 2016-08-30 NOTE — Telephone Encounter (Signed)
Plan of Care faxed 08/29/16

## 2016-08-30 NOTE — Progress Notes (Signed)
*  PRELIMINARY RESULTS* Vascular Ultrasound Bilateral lower extremity venous duplex has been completed.  Preliminary findings: No evidence of deep vein thrombosis in the visualized veins of the lower extremities.  Negative for baker's cysts bilaterally.  Varicose veins seen anterior left ankle, area of complaint, appear negative for thrombosis.  Moderate amount of interstitial fluid noted in area.   Everrett Coombe 08/30/2016, 6:49 PM

## 2016-08-30 NOTE — Telephone Encounter (Signed)
Plan of Care faxed 08/30/16 °

## 2016-08-30 NOTE — ED Notes (Signed)
Notified CT that solu-cortef has been given.

## 2016-08-30 NOTE — ED Triage Notes (Signed)
Per Pt, Pt is coming from home with complaints of increasing SOB in the last two days. Pt had a total knee replacement on the left knee four weeks ago. Pt has been on 20 mg of Xarelto twice a day since the surgery, but reports that she is experience the same symptoms as before when she had PE. Reports increased left leg pain. Denies CP

## 2016-08-30 NOTE — ED Provider Notes (Signed)
[facility] DEPT Provider Note   CSN: [id number] Arrival date & time: 08/30/16  1724     History   Chief Complaint Chief Complaint  Patient presents with  . Shortness of Breath    HPI [name] is a 56 y.o. female.  56yo F w/ PMH including PE, A fib on Xarelto, PVD, HLD who p/w shortness of breath. Pt had left knee replacement 1 month ago. Yesterday she had a relatively sudden onset of shortness of breath during exertion. Today her SOB has been worse even at rest. She is compliant with Xarelto, although she may have missed dose 1 day. No associated chest pain, diaphoresis, N/V, fever, cough/cold symptoms. She reports mild lightheadedness. Her left leg is chronically larger than her right leg but she feels that she has been recovering well since surgery and her swelling has been improving recently. She has had some mild redness recently but no increased pain; she is weaning off narcotics.   The history is provided by the patient.    Past Medical History:  Diagnosis Date  . Anemia   . Arthritis   . Atrial fibrillation ([place])   . Bleeding ulcer 12-2010  hospitalized for 1 week  . Blood transfusion   . Blood transfusion without reported diagnosis    2011-2 units transfused, bleeding ulcer  . Bruises easily   . CHF (congestive heart failure) ([place])    with PE resolved after   . Constipation   . Difficulty in swallowing 03-24-13   no longer a problem  . DVT (deep venous thrombosis) ([place]) 06-2013   right leg  . Dysrhythmia     Hx of A fib   . GERD (gastroesophageal reflux disease)   . Hyperlipidemia   . Leg swelling 03-24-13   not a problem any longer  . Peripheral vascular disease ([place])   . Pulmonary embolus ([place]) 06-2013  . Reflux   . Shortness of breath    with PE  . Vomiting 03-24-13   problem resolved after lap band revision    Patient Active Problem List   Diagnosis Date Noted  . Presence of left artificial knee joint 08/14/2016  . Unilateral primary  osteoarthritis, left knee 07/31/2016  . Status post total knee replacement, left 07/31/2016  . Abdominal hematoma   . Post-operative state   . Paroxysmal atrial fibrillation ([facility])   . Controlled type 2 diabetes mellitus with diabetic nephropathy ([place])   . Wound infection 01/25/2016  . Hydronephrosis, left 01/25/2016  . Hydronephrosis, right 01/25/2016  . DVT (deep venous thrombosis) ([place]) 01/25/2016  . Atrial fibrillation ([place]) 01/25/2016  . Diabetes mellitus with complication ([place])   . Wound infection after surgery, initial encounter   . Varicose veins of lower extremities with complications 29/79/8921  . Pain and swelling of left lower leg 10/27/2013  . Pain of left lower extremity 10/27/2013  . Gastroenteritis, acute 09/08/2013  . Fever, unspecified 09/04/2013  . ASD (atrial septal defect) 08/13/2013  . Pulmonary embolism ([place]) 06/22/2013  . Dyspnea 06/11/2013  . Anterior slip repaired Oct 2012 01/18/2011  . GI bleed 12/18/2010  . Diabetes mellitus ([place]) 12/18/2010  . Varicose veins of lower extremities with other complications 19/41/7408  . Diabetes mellitus   . HIP PAIN, RIGHT 03/06/2010    Past Surgical History:  Procedure Laterality Date  . APPENDECTOMY    . CARPEL TUNNEL BILATEREAL    . CHOLECYSTECTOMY     5'12  . COLONOSCOPY N/A 04/10/2013   Procedure: COLONOSCOPY;  Surgeon:  Beryle Beams, MD;  Location: [facility];  Service: Endoscopy;  Laterality: N/A;  . DIAGNOSTIC LAPAROSCOPY    . ENDOVENOUS ABLATION SAPHENOUS VEIN W/ LASER  06-2008  . ENDOVENOUS ABLATION SAPHENOUS VEIN W/ LASER Left 12-09-2013   EVLA LEFT GREATER SAPHENOUS VEIN BY TODD EARLY MD  . ESOPHAGOGASTRODUODENOSCOPY  12/18/2010   Procedure: ESOPHAGOGASTRODUODENOSCOPY (EGD);  Surgeon: Gatha Mayer, MD;  Location: [facility];  Service: Endoscopy;  Laterality: N/A;  . LAPAROSCOPIC GASTRIC BANDING  01-05-2008  . LAPAROSCOPIC GASTRIC BANDING  12/18/2010   01/04/2009  . SALPHINGOOOPHORECTOMY Right      cyst removal  . TONSILLECTOMY    . TOTAL KNEE ARTHROPLASTY Left 07/31/2016   Procedure: LEFT TOTAL KNEE ARTHROPLASTY;  Surgeon: [name], MD;  Location: [facility];  Service: Orthopedics;  Laterality: Left;  . WEDGE RESECTION OF OVARY  LEFT OVARY     OB History    No data available       Home Medications    Prior to Admission medications   Medication Sig Start Date End Date Taking? Authorizing Provider  acetaminophen (TYLENOL) 325 MG tablet Take 650 mg by mouth every 6 (six) hours as needed for mild pain or moderate pain.   Yes [provider]  hydroxychloroquine (PLAQUENIL) 200 MG tablet Take 200 mg by mouth 2 (two) times daily.   Yes [provider]  linaclotide (LINZESS) 145 MCG CAPS capsule Take 145 mcg by mouth daily as needed (for constipation).   Yes [provider]  methocarbamol (ROBAXIN) 500 MG tablet Take 1 tablet (500 mg total) by mouth every 6 (six) hours as needed for muscle spasms. 08/03/16  Yes [name], MD  metoprolol tartrate (LOPRESSOR) 25 MG tablet Take 0.5 tablets (12.5 mg total) by mouth 2 (two) times daily. Patient taking differently: Take 25 mg by mouth daily.  05/25/16  Yes Nahser, Wonda Cheng, MD  omeprazole (PRILOSEC) 20 MG capsule Take 20 mg by mouth 2 (two) times daily before a meal.   Yes [provider]  oxyCODONE-acetaminophen (ROXICET) 5-325 MG tablet Take 1-2 tablets by mouth every 4 (four) hours as needed. 08/14/16  Yes Pete Pelt, PA-C  rivaroxaban (XARELTO) 20 MG TABS tablet Take 20 mg by mouth daily.    Yes [provider]    Family History Family History  Problem Relation Age of Onset  . Heart disease Father   . Kidney disease Father   . Peripheral vascular disease Father   . Heart attack Father        23  . Diabetes Mother   . Arthritis Mother     Social History Social History  Substance Use Topics  . Smoking status: Never Smoker  . Smokeless tobacco: Never  Used  . Alcohol use No     Allergies   Contrast media [iodinated diagnostic agents]; Penicillins; and Ancef [cefazolin sodium]   Review of Systems Review of Systems   Physical Exam Updated Vital Signs BP 122/65   Pulse 64   Temp 98.3 F (36.8 C) (Oral)   Resp 19   Ht 5\' 6"  (1.676 m)   Wt 127 kg (280 lb)   SpO2 96%   BMI 45.19 kg/m   Physical Exam  Constitutional: She is oriented to person, place, and time. She appears well-developed and well-nourished. No distress.  HENT:  Head: Normocephalic and atraumatic.  Moist mucous membranes  Eyes: Pupils are equal, round, and reactive to light. Conjunctivae are normal.  Neck: Neck supple.  Cardiovascular: Normal rate, regular rhythm and normal heart sounds.   No murmur heard. Pulmonary/Chest: Effort normal and breath sounds normal.  Abdominal: Soft. Bowel sounds are normal. She exhibits no distension. There is no tenderness.  Musculoskeletal: She exhibits edema.  Healing surgical scar on L knee with no drainage or redness; edema b/l LE to knees left greater than right, faint redness on left shin, tenderness over varicose veins on distal anterior lower leg  Neurological: She is alert and oriented to person, place, and time.  Fluent speech  Skin: Skin is warm and dry.  Psychiatric: She has a normal mood and affect. Judgment normal.  Nursing note and vitals reviewed.    ED Treatments / Results  Labs (all labs ordered are listed, but only abnormal results are displayed) Labs Reviewed  COMPREHENSIVE METABOLIC PANEL - Abnormal; Notable for the following:       Result Value   ALT 12 (*)    All other components within normal limits  CBC WITH DIFFERENTIAL/PLATELET - Abnormal; Notable for the following:    Hemoglobin 9.6 (*)    HCT 33.4 (*)    MCV 72.9 (*)    MCH 21.0 (*)    MCHC 28.7 (*)    RDW 19.0 (*)    All other components within normal limits  PROTIME-INR - Abnormal; Notable for the following:    Prothrombin Time  19.1 (*)    All other components within normal limits  BRAIN NATRIURETIC PEPTIDE  I-STAT TROPONIN, ED  I-STAT TROPONIN, ED    EKG  EKG Interpretation  Date/Time:  Thursday August 30 2016 17:38:01 EDT Ventricular Rate:  66 PR Interval:  148 QRS Duration: 108 QT Interval:  446 QTC Calculation: 467 R Axis:   22 Text Interpretation:  Normal sinus rhythm Low voltage QRS Incomplete right bundle branch block Cannot rule out Anterior infarct , age undetermined Abnormal ECG since previous tracing, A fib has converted to sinus rhythm Confirmed by Theotis Burrow [phone]) on 08/30/2016 10:06:11 PM       Radiology Dg Chest 2 View  Result Date: 08/30/2016 CLINICAL DATA:  Shortness of breath since yesterday. Left lower rib pain. History of pulmonary embolus 3 years ago. EXAM: CHEST  2 VIEW COMPARISON:  01/25/2016 FINDINGS: Normal heart size and pulmonary vascularity. No focal airspace disease or consolidation in the lungs. No blunting of costophrenic angles. No pneumothorax. Mediastinal contours appear intact. Degenerative changes in the spine. IMPRESSION: No active cardiopulmonary disease. Electronically Signed   By: Lucienne Capers M.D.   On: 08/30/2016 18:34    Procedures Procedures (including critical care time)  Medications Ordered in ED Medications  diphenhydrAMINE (BENADRYL) capsule 50 mg (not administered)    Or  diphenhydrAMINE (BENADRYL) injection 50 mg (not administered)  iopamidol (ISOVUE-370) 76 % injection (not administered)  hydrocortisone sodium succinate (SOLU-CORTEF) injection 200 mg (200 mg Intravenous Given 08/30/16 2258)     Initial Impression / Assessment and Plan / ED Course  I have reviewed the triage vital signs and the nursing notes.  Pertinent labs & imaging results that were available during my care of the patient were reviewed by me and considered in my medical decision making (see chart for details).     PT w/ h/o PE, knee replacement 4 weeks ago, p/w  Shortness of breath that began yesterday and has been worsening today. She was comfortable on exam with normal vital signs. Her surgical sites appear to be well-healing. She had swelling of her left leg greater than right  leg which she states is chronically present even before surgery. She had some tenderness over varicose veins, DVT ultrasound was negative and no abnormalities of those veins on ultrasound. She has not had any increased pain and states that her swelling has been slowly improving thus cellulitis or other infectious process of her leg seems unlikely.  Her labwork shows stable hemoglobin, unremarkable CMP, normal WBC count, negative troponin and BNP. I'm concerned about the possibility of a PE given that her previous PE was unprovoked and she has had recent surgery. She has in anaphylactic allergy listed to contrast but I reviewed her chart and it shows that she has had 2 CTs last year during workup of PE and distant history of other contrasted CT scans. I discussed with her risks and benefits of obtaining CT with pretreatment. The patient voiced understanding and wanted to proceed. I have given her hydrocortisone and will give Benadryl just prior to imaging. I'm signing out to the oncoming provider who will follow-up on CT scan. Final Clinical Impressions(s) / ED Diagnoses   Final diagnoses:  None    New Prescriptions New Prescriptions   No medications on file     Ayisha Pol, Wenda Overland, MD 08/31/16 [phone]

## 2016-08-30 NOTE — ED Notes (Signed)
Patient placed in room on monitor. Call light within reach, resting comfortably.

## 2016-08-31 ENCOUNTER — Emergency Department (HOSPITAL_COMMUNITY): Payer: 59

## 2016-08-31 ENCOUNTER — Encounter (HOSPITAL_COMMUNITY): Payer: Self-pay

## 2016-08-31 DIAGNOSIS — R0602 Shortness of breath: Secondary | ICD-10-CM | POA: Diagnosis not present

## 2016-08-31 MED ORDER — LIDOCAINE HCL 2 % IJ SOLN: 10.0000 mL | Freq: Once | INTRAMUSCULAR | Status: DC

## 2016-08-31 MED ORDER — IOPAMIDOL (ISOVUE-370) INJECTION 76%
100.0000 mL | Freq: Once | INTRAVENOUS | Status: AC | PRN
  Administered 2016-08-31: 100 mL via INTRAVENOUS

## 2016-08-31 MED ORDER — HYDROCORTISONE NA SUCCINATE PF 250 MG IJ SOLR
200.0000 mg | Freq: Once | INTRAMUSCULAR | Status: AC
  Administered 2016-08-31: 200 mg via INTRAVENOUS
  Filled 2016-08-31: qty 200

## 2016-08-31 MED ORDER — DIPHENHYDRAMINE HCL 50 MG/ML IJ SOLN
50.0000 mg | Freq: Once | INTRAMUSCULAR | Status: AC
  Administered 2016-08-31: 50 mg via INTRAVENOUS
  Filled 2016-08-31: qty 1

## 2016-08-31 NOTE — ED Notes (Signed)
Patient transported to CT 

## 2016-08-31 NOTE — Discharge Instructions (Signed)
Please see your doctor in close follow up ER for worsening symptoms.

## 2016-08-31 NOTE — ED Notes (Signed)
Give Benadryl at 9am

## 2016-08-31 NOTE — ED Notes (Signed)
Per CT, pt's IV is infiltrated, pharmacy notified. Per pharmacy, no concern for extravasation at this time.

## 2016-08-31 NOTE — ED Provider Notes (Signed)
The CT scan is negative Pt is asymptomatic Requesting d/c - informed of all her results Stable appering for d/c Pt aware of reasons for return.   Noemi Chapel, MD 08/31/16 [phone]

## 2016-09-04 DIAGNOSIS — M25562 Pain in left knee: Secondary | ICD-10-CM | POA: Diagnosis not present

## 2016-09-04 DIAGNOSIS — R262 Difficulty in walking, not elsewhere classified: Secondary | ICD-10-CM | POA: Diagnosis not present

## 2016-09-06 DIAGNOSIS — M25562 Pain in left knee: Secondary | ICD-10-CM | POA: Diagnosis not present

## 2016-09-06 DIAGNOSIS — R262 Difficulty in walking, not elsewhere classified: Secondary | ICD-10-CM | POA: Diagnosis not present

## 2016-09-07 ENCOUNTER — Other Ambulatory Visit: Payer: Self-pay | Admitting: *Deleted

## 2016-09-07 MED ORDER — METOPROLOL TARTRATE 25 MG PO TABS: 25.0000 mg | ORAL_TABLET | Freq: Every day | ORAL | 3 refills | Status: DC

## 2016-09-11 DIAGNOSIS — R262 Difficulty in walking, not elsewhere classified: Secondary | ICD-10-CM | POA: Diagnosis not present

## 2016-09-11 DIAGNOSIS — M25562 Pain in left knee: Secondary | ICD-10-CM | POA: Diagnosis not present

## 2016-09-13 ENCOUNTER — Encounter (INDEPENDENT_AMBULATORY_CARE_PROVIDER_SITE_OTHER): Payer: Self-pay

## 2016-09-13 ENCOUNTER — Ambulatory Visit (INDEPENDENT_AMBULATORY_CARE_PROVIDER_SITE_OTHER): Payer: 59 | Admitting: Physician Assistant

## 2016-09-13 DIAGNOSIS — R262 Difficulty in walking, not elsewhere classified: Secondary | ICD-10-CM | POA: Diagnosis not present

## 2016-09-13 DIAGNOSIS — M25562 Pain in left knee: Secondary | ICD-10-CM | POA: Diagnosis not present

## 2016-09-14 ENCOUNTER — Ambulatory Visit (INDEPENDENT_AMBULATORY_CARE_PROVIDER_SITE_OTHER): Payer: 59 | Admitting: Rheumatology

## 2016-09-14 ENCOUNTER — Encounter: Payer: Self-pay | Admitting: Rheumatology

## 2016-09-14 VITALS — BP 121/74 | HR 69 | Ht 66.0 in | Wt 279.0 lb

## 2016-09-14 DIAGNOSIS — M170 Bilateral primary osteoarthritis of knee: Secondary | ICD-10-CM | POA: Diagnosis not present

## 2016-09-14 DIAGNOSIS — I4891 Unspecified atrial fibrillation: Secondary | ICD-10-CM | POA: Diagnosis not present

## 2016-09-14 DIAGNOSIS — M25562 Pain in left knee: Secondary | ICD-10-CM

## 2016-09-14 DIAGNOSIS — M19041 Primary osteoarthritis, right hand: Secondary | ICD-10-CM

## 2016-09-14 DIAGNOSIS — M47816 Spondylosis without myelopathy or radiculopathy, lumbar region: Secondary | ICD-10-CM | POA: Diagnosis not present

## 2016-09-14 DIAGNOSIS — M19042 Primary osteoarthritis, left hand: Secondary | ICD-10-CM | POA: Diagnosis not present

## 2016-09-14 DIAGNOSIS — Z79899 Other long term (current) drug therapy: Secondary | ICD-10-CM | POA: Diagnosis not present

## 2016-09-14 DIAGNOSIS — M0579 Rheumatoid arthritis with rheumatoid factor of multiple sites without organ or systems involvement: Secondary | ICD-10-CM | POA: Diagnosis not present

## 2016-09-14 DIAGNOSIS — I2699 Other pulmonary embolism without acute cor pulmonale: Secondary | ICD-10-CM | POA: Diagnosis not present

## 2016-09-14 DIAGNOSIS — G8929 Other chronic pain: Secondary | ICD-10-CM | POA: Diagnosis not present

## 2016-09-14 DIAGNOSIS — R609 Edema, unspecified: Secondary | ICD-10-CM

## 2016-09-14 MED ORDER — HYDROXYCHLOROQUINE SULFATE 200 MG PO TABS: 200.0000 mg | ORAL_TABLET | Freq: Two times a day (BID) | ORAL | 0 refills | Status: AC

## 2016-09-14 NOTE — Patient Instructions (Signed)
  We have new fax number;

## 2016-09-14 NOTE — Progress Notes (Signed)
Office Visit Note  Patient: [name]             Date of Birth: 23-Jul-1960           MRN: [id number]             PCP: Mateo Flow, MD Referring: Mateo Flow, MD Visit Date: 09/14/2016 Occupation: _0 @     Subjective:  No chief complaint on file.   History of Present Illness: [name] is a 56 y.o. female  Was last seen in our office on 06/06/2016 for rheumatoid arthritis with positive rheumatoid factor, negative CCP and negative ANA, and high-risk prescription (Plaquenil 200 mg twice a day Monday through Friday. Note: Patient has a history of pulmonary embolism and A. fib and is currently taking xarelto for this. She also has a history of left knee joint pain and on September 2017 visit, I gave her cortisone injection of 40 mg of Kenalog mixed with one half mL's 1% lidocaine. At the last visit in May, she reported that She developed sepsis and she had to stop her Plaquenil temporarily because she had Escherichia coli and finally got well enough to restart the Plaquenil soon after the last visit. Note that at the time she had sepsis, she was on various antibiotics such as vancomycin, Bactrim, amoxicillin.  Today, pt is doing well today w/ RA. Left knee has been replaced by dr. Ninfa Linden on July 31, 2016.  Pain can get high as "7" at P.T.  Pt using oxycodone prn pain.  Tries to limit oxycodone use (last use was 5 days ago).      Activities of Daily Living:  Patient reports morning stiffness for 20 minutes.   Patient Denies nocturnal pain.  Difficulty dressing/grooming: Reports Difficulty climbing stairs: Reports Difficulty getting out of chair: Reports Difficulty using hands for taps, buttons, cutlery, and/or writing: Denies   Review of Systems  Constitutional: Negative for fatigue.  HENT: Negative for mouth sores and mouth dryness.   Eyes: Negative for dryness.  Respiratory: Negative for shortness of breath.   Gastrointestinal: Negative for constipation  and diarrhea.  Musculoskeletal: Negative for myalgias and myalgias.  Skin: Negative for sensitivity to sunlight.  Psychiatric/Behavioral: Negative for decreased concentration and sleep disturbance.    PMFS History:  Patient Active Problem List   Diagnosis Date Noted  . Presence of left artificial knee joint 08/14/2016  . Unilateral primary osteoarthritis, left knee 07/31/2016  . Status post total knee replacement, left 07/31/2016  . Abdominal hematoma   . Post-operative state   . Paroxysmal atrial fibrillation (HCC)   . Controlled type 2 diabetes mellitus with diabetic nephropathy ([place])   . Wound infection 01/25/2016  . Hydronephrosis, left 01/25/2016  . Hydronephrosis, right 01/25/2016  . DVT (deep venous thrombosis) ([place]) 01/25/2016  . Atrial fibrillation ([place]) 01/25/2016  . Diabetes mellitus with complication ([place])   . Wound infection after surgery, initial encounter   . Varicose veins of lower extremities with complications 73/71/0626  . Pain and swelling of left lower leg 10/27/2013  . Pain of left lower extremity 10/27/2013  . Gastroenteritis, acute 09/08/2013  . Fever, unspecified 09/04/2013  . ASD (atrial septal defect) 08/13/2013  . Pulmonary embolism ([place]) 06/22/2013  . Dyspnea 06/11/2013  . Anterior slip repaired Oct 2012 01/18/2011  . GI bleed 12/18/2010  . Diabetes mellitus ([place]) 12/18/2010  . Varicose veins of lower extremities with other complications 94/85/4627  . Diabetes mellitus   . HIP PAIN, RIGHT 03/06/2010  Past Medical History:  Diagnosis Date  . Anemia   . Arthritis   . Atrial fibrillation ([place])   . Bleeding ulcer 12-2010  hospitalized for 1 week  . Blood transfusion   . Blood transfusion without reported diagnosis    2011-2 units transfused, bleeding ulcer  . Bruises easily   . CHF (congestive heart failure) ([place])    with PE resolved after   . Constipation   . Difficulty in swallowing 03-24-13   no longer a problem  . DVT (deep venous  thrombosis) ([place]) 06-2013   right leg  . Dysrhythmia     Hx of A fib   . GERD (gastroesophageal reflux disease)   . Hyperlipidemia   . Leg swelling 03-24-13   not a problem any longer  . Peripheral vascular disease ([place])   . Pulmonary embolus ([place]) 06-2013  . Reflux   . Shortness of breath    with PE  . Vomiting 03-24-13   problem resolved after lap band revision    Family History  Problem Relation Age of Onset  . Heart disease Father   . Kidney disease Father   . Peripheral vascular disease Father   . Heart attack Father        97  . Diabetes Mother   . Arthritis Mother    Past Surgical History:  Procedure Laterality Date  . APPENDECTOMY    . CARPEL TUNNEL BILATEREAL    . CHOLECYSTECTOMY     5'12  . COLONOSCOPY N/A 04/10/2013   Procedure: COLONOSCOPY;  Surgeon: Beryle Beams, MD;  Location: [facility];  Service: Endoscopy;  Laterality: N/A;  . DIAGNOSTIC LAPAROSCOPY    . ENDOVENOUS ABLATION SAPHENOUS VEIN W/ LASER  06-2008  . ENDOVENOUS ABLATION SAPHENOUS VEIN W/ LASER Left 12-09-2013   EVLA LEFT GREATER SAPHENOUS VEIN BY TODD EARLY MD  . ESOPHAGOGASTRODUODENOSCOPY  12/18/2010   Procedure: ESOPHAGOGASTRODUODENOSCOPY (EGD);  Surgeon: Gatha Mayer, MD;  Location: [facility];  Service: Endoscopy;  Laterality: N/A;  . LAPAROSCOPIC GASTRIC BANDING  01-05-2008  . LAPAROSCOPIC GASTRIC BANDING  12/18/2010   01/04/2009  . SALPHINGOOOPHORECTOMY Right    cyst removal  . TONSILLECTOMY    . TOTAL KNEE ARTHROPLASTY Left 07/31/2016   Procedure: LEFT TOTAL KNEE ARTHROPLASTY;  Surgeon: [name], MD;  Location: [facility];  Service: Orthopedics;  Laterality: Left;  . WEDGE RESECTION OF OVARY  LEFT OVARY    Social History   Social History Narrative  . No narrative on file     Objective: Vital Signs: There were no vitals taken for this visit.   Physical Exam  Constitutional: She is oriented to person, place, and time. She appears well-developed and  well-nourished.  HENT:  Head: Normocephalic and atraumatic.  Eyes: Pupils are equal, round, and reactive to light. EOM are normal.  Cardiovascular: Normal rate, regular rhythm and normal heart sounds.  Exam reveals no gallop and no friction rub.   No murmur heard. Pulmonary/Chest: Effort normal and breath sounds normal. She has no wheezes. She has no rales.  Abdominal: Soft. Bowel sounds are normal. She exhibits no distension. There is no tenderness. There is no guarding. No hernia.  Musculoskeletal: Normal range of motion. She exhibits no edema, tenderness or deformity.  Lymphadenopathy:    She has no cervical adenopathy.  Neurological: She is alert and oriented to person, place, and time. Coordination normal.  Skin: Skin is warm and dry. Capillary refill takes less than 2 seconds. No rash noted.  Psychiatric:  She has a normal mood and affect. Her behavior is normal.  Nursing note and vitals reviewed.    Musculoskeletal Exam:  Full range of motion of all joints Grip strength is equal and strong bilaterally Fibromyalgia tender points are all absent  CDAI Exam: CDAI Homunculus Exam:   Joint Counts:  CDAI Tender Joint count: 0 CDAI Swollen Joint count: 0   No synovitis on examination   Investigation: No additional findings.  Admission on 08/30/2016, Discharged on 08/31/2016  Component Date Value Ref Range Status  . B Natriuretic Peptide 08/30/2016 18.5  0.0 - 100.0 pg/mL Final  . Sodium 08/30/2016 141  135 - 145 mmol/L Final  . Potassium 08/30/2016 3.7  3.5 - 5.1 mmol/L Final  . Chloride 08/30/2016 108  101 - 111 mmol/L Final  . CO2 08/30/2016 25  22 - 32 mmol/L Final  . Glucose, Bld 08/30/2016 96  65 - 99 mg/dL Final  . BUN 08/30/2016 12  6 - 20 mg/dL Final  . Creatinine, Ser 08/30/2016 0.63  0.44 - 1.00 mg/dL Final  . Calcium 08/30/2016 9.2  8.9 - 10.3 mg/dL Final  . Total Protein 08/30/2016 6.8  6.5 - 8.1 g/dL Final  . Albumin 08/30/2016 3.8  3.5 - 5.0 g/dL Final  .  AST 08/30/2016 18  15 - 41 U/L Final  . ALT 08/30/2016 12* 14 - 54 U/L Final  . Alkaline Phosphatase 08/30/2016 69  38 - 126 U/L Final  . Total Bilirubin 08/30/2016 0.4  0.3 - 1.2 mg/dL Final  . GFR calc non Af Amer 08/30/2016 >60  >60 mL/min Final  . GFR calc Af Amer 08/30/2016 >60  >60 mL/min Final   Comment: (NOTE) The eGFR has been calculated using the CKD EPI equation. This calculation has not been validated in all clinical situations. eGFR's persistently <60 mL/min signify possible Chronic Kidney Disease.   . Anion gap 08/30/2016 8  5 - 15 Final  . WBC 08/30/2016 4.1  4.0 - 10.5 K/uL Final  . RBC 08/30/2016 4.58  3.87 - 5.11 MIL/uL Final  . Hemoglobin 08/30/2016 9.6* 12.0 - 15.0 g/dL Final  . HCT 08/30/2016 33.4* 36.0 - 46.0 % Final  . MCV 08/30/2016 72.9* 78.0 - 100.0 fL Final  . MCH 08/30/2016 21.0* 26.0 - 34.0 pg Final  . MCHC 08/30/2016 28.7* 30.0 - 36.0 g/dL Final  . RDW 08/30/2016 19.0* 11.5 - 15.5 % Final  . Platelets 08/30/2016 203  150 - 400 K/uL Final  . Neutrophils Relative % 08/30/2016 50  % Final  . Lymphocytes Relative 08/30/2016 39  % Final  . Monocytes Relative 08/30/2016 5  % Final  . Eosinophils Relative 08/30/2016 5  % Final  . Basophils Relative 08/30/2016 1  % Final  . Neutro Abs 08/30/2016 2.1  1.7 - 7.7 K/uL Final  . Lymphs Abs 08/30/2016 1.6  0.7 - 4.0 K/uL Final  . Monocytes Absolute 08/30/2016 0.2  0.1 - 1.0 K/uL Final  . Eosinophils Absolute 08/30/2016 0.2  0.0 - 0.7 K/uL Final  . Basophils Absolute 08/30/2016 0.0  0.0 - 0.1 K/uL Final  . RBC Morphology 08/30/2016 STOMATOCYTES   Final   ELLIPTOCYTES  . Prothrombin Time 08/30/2016 19.1* 11.4 - 15.2 seconds Final  . INR 08/30/2016 1.59   Final  . Troponin i, poc 08/30/2016 0.00  0.00 - 0.08 ng/mL Final  . Comment 3 08/30/2016          Final   Comment: Due to the release kinetics of cTnI,  a negative result within the first hours of the onset of symptoms does not rule out myocardial infarction  with certainty. If myocardial infarction is still suspected, repeat the test at appropriate intervals.   . Troponin i, poc 08/30/2016 0.00  0.00 - 0.08 ng/mL Final  . Comment 3 08/30/2016          Final   Comment: Due to the release kinetics of cTnI, a negative result within the first hours of the onset of symptoms does not rule out myocardial infarction with certainty. If myocardial infarction is still suspected, repeat the test at appropriate intervals.   Admission on 07/31/2016, Discharged on 08/03/2016  Component Date Value Ref Range Status  . WBC 08/01/2016 7.0  4.0 - 10.5 K/uL Final  . RBC 08/01/2016 4.07  3.87 - 5.11 MIL/uL Final  . Hemoglobin 08/01/2016 8.6* 12.0 - 15.0 g/dL Final   REPEATED TO VERIFY  . HCT 08/01/2016 30.1* 36.0 - 46.0 % Final  . MCV 08/01/2016 74.0* 78.0 - 100.0 fL Final  . MCH 08/01/2016 21.1* 26.0 - 34.0 pg Final  . MCHC 08/01/2016 28.6* 30.0 - 36.0 g/dL Final  . RDW 08/01/2016 18.3* 11.5 - 15.5 % Final  . Platelets 08/01/2016 166  150 - 400 K/uL Final   REPEATED TO VERIFY  . Sodium 08/01/2016 139  135 - 145 mmol/L Final  . Potassium 08/01/2016 3.8  3.5 - 5.1 mmol/L Final  . Chloride 08/01/2016 108  101 - 111 mmol/L Final  . CO2 08/01/2016 26  22 - 32 mmol/L Final  . Glucose, Bld 08/01/2016 111* 65 - 99 mg/dL Final  . BUN 08/01/2016 11  6 - 20 mg/dL Final  . Creatinine, Ser 08/01/2016 0.70  0.44 - 1.00 mg/dL Final  . Calcium 08/01/2016 8.0* 8.9 - 10.3 mg/dL Final  . GFR calc non Af Amer 08/01/2016 >60  >60 mL/min Final  . GFR calc Af Amer 08/01/2016 >60  >60 mL/min Final   Comment: (NOTE) The eGFR has been calculated using the CKD EPI equation. This calculation has not been validated in all clinical situations. eGFR's persistently <60 mL/min signify possible Chronic Kidney Disease.   . Anion gap 08/01/2016 5  5 - 15 Final  . WBC 08/02/2016 7.7  4.0 - 10.5 K/uL Final  . RBC 08/02/2016 4.05  3.87 - 5.11 MIL/uL Final  . Hemoglobin 08/02/2016 8.5*  12.0 - 15.0 g/dL Final  . HCT 08/02/2016 29.6* 36.0 - 46.0 % Final  . MCV 08/02/2016 73.1* 78.0 - 100.0 fL Final  . MCH 08/02/2016 21.0* 26.0 - 34.0 pg Final  . MCHC 08/02/2016 28.7* 30.0 - 36.0 g/dL Final  . RDW 08/02/2016 18.2* 11.5 - 15.5 % Final  . Platelets 08/02/2016 185  150 - 400 K/uL Final  Hospital Outpatient Visit on 07/25/2016  Component Date Value Ref Range Status  . MRSA, PCR 07/25/2016 NEGATIVE  NEGATIVE Final  . Staphylococcus aureus 07/25/2016 NEGATIVE  NEGATIVE Final   Comment:        The Xpert SA Assay (FDA approved for NASAL specimens in patients over 90 years of age), is one component of a comprehensive surveillance program.  Test performance has been validated by [facility] for patients greater than or equal to 46 year old. It is not intended to diagnose infection nor to guide or monitor treatment.   . Sodium 07/25/2016 142  135 - 145 mmol/L Final  . Potassium 07/25/2016 4.4  3.5 - 5.1 mmol/L Final  . Chloride 07/25/2016 109  101 - 111 mmol/L Final  . CO2 07/25/2016 28  22 - 32 mmol/L Final  . Glucose, Bld 07/25/2016 96  65 - 99 mg/dL Final  . BUN 07/25/2016 16  6 - 20 mg/dL Final  . Creatinine, Ser 07/25/2016 0.64  0.44 - 1.00 mg/dL Final  . Calcium 07/25/2016 9.2  8.9 - 10.3 mg/dL Final  . GFR calc non Af Amer 07/25/2016 >60  >60 mL/min Final  . GFR calc Af Amer 07/25/2016 >60  >60 mL/min Final   Comment: (NOTE) The eGFR has been calculated using the CKD EPI equation. This calculation has not been validated in all clinical situations. eGFR's persistently <60 mL/min signify possible Chronic Kidney Disease.   . Anion gap 07/25/2016 5  5 - 15 Final  . WBC 07/25/2016 5.4  4.0 - 10.5 K/uL Final  . RBC 07/25/2016 4.80  3.87 - 5.11 MIL/uL Final  . Hemoglobin 07/25/2016 10.0* 12.0 - 15.0 g/dL Final  . HCT 07/25/2016 35.0* 36.0 - 46.0 % Final  . MCV 07/25/2016 72.9* 78.0 - 100.0 fL Final  . MCH 07/25/2016 20.8* 26.0 - 34.0 pg Final  . MCHC 07/25/2016  28.6* 30.0 - 36.0 g/dL Final  . RDW 07/25/2016 18.3* 11.5 - 15.5 % Final  . Platelets 07/25/2016 208  150 - 400 K/uL Final  . Prothrombin Time 07/25/2016 14.6  11.4 - 15.2 seconds Final  . INR 07/25/2016 1.13   Final  Office Visit on 06/06/2016  Component Date Value Ref Range Status  . WBC 06/06/2016 5.3  3.8 - 10.8 K/uL Final  . RBC 06/06/2016 4.99  3.80 - 5.10 MIL/uL Final  . Hemoglobin 06/06/2016 10.5* 11.7 - 15.5 g/dL Final  . HCT 06/06/2016 35.2  35.0 - 45.0 % Final  . MCV 06/06/2016 70.5* 80.0 - 100.0 fL Final  . MCH 06/06/2016 21.0* 27.0 - 33.0 pg Final  . MCHC 06/06/2016 29.8* 32.0 - 36.0 g/dL Final  . RDW 06/06/2016 17.4* 11.0 - 15.0 % Final  . Platelets 06/06/2016 267  140 - 400 K/uL Final  . MPV 06/06/2016 10.5  7.5 - 12.5 fL Final  . Neutro Abs 06/06/2016 3233  1,500 - 7,800 cells/uL Final  . Lymphs Abs 06/06/2016 1378  850 - 3,900 cells/uL Final  . Monocytes Absolute 06/06/2016 424  200 - 950 cells/uL Final  . Eosinophils Absolute 06/06/2016 212  15 - 500 cells/uL Final  . Basophils Absolute 06/06/2016 53  0 - 200 cells/uL Final  . Neutrophils Relative % 06/06/2016 61  % Final  . Lymphocytes Relative 06/06/2016 26  % Final  . Monocytes Relative 06/06/2016 8  % Final  . Eosinophils Relative 06/06/2016 4  % Final  . Basophils Relative 06/06/2016 1  % Final  . Smear Review 06/06/2016 Criteria for review not met   Final  . Sodium 06/06/2016 143  135 - 146 mmol/L Final  . Potassium 06/06/2016 4.9  3.5 - 5.3 mmol/L Final  . Chloride 06/06/2016 106  98 - 110 mmol/L Final  . CO2 06/06/2016 25  20 - 31 mmol/L Final  . Glucose, Bld 06/06/2016 85  65 - 99 mg/dL Final  . BUN 06/06/2016 13  7 - 25 mg/dL Final  . Creat 06/06/2016 0.66  0.50 - 1.05 mg/dL Final   Comment:   For patients > or = 57 years of age: The upper reference limit for Creatinine is approximately 13% higher for people identified as African-American.     . Total Bilirubin 06/06/2016 0.4  0.2 -  1.2 mg/dL Final   . Alkaline Phosphatase 06/06/2016 66  33 - 130 U/L Final  . AST 06/06/2016 18  10 - 35 U/L Final  . ALT 06/06/2016 11  6 - 29 U/L Final  . Total Protein 06/06/2016 6.6  6.1 - 8.1 g/dL Final  . Albumin 06/06/2016 3.8  3.6 - 5.1 g/dL Final  . Calcium 06/06/2016 9.2  8.6 - 10.4 mg/dL Final  . GFR, Est African American 06/06/2016 >89  >=60 mL/min Final  . GFR, Est Non African American 06/06/2016 >89  >=60 mL/min Final  Hospital Outpatient Visit on 03/22/2016  Component Date Value Ref Range Status  . WBC 03/22/2016 4.4  4.0 - 10.5 K/uL Final  . RBC 03/22/2016 4.41  3.87 - 5.11 MIL/uL Final  . Hemoglobin 03/22/2016 10.8* 12.0 - 15.0 g/dL Final  . HCT 03/22/2016 36.2  36.0 - 46.0 % Final  . MCV 03/22/2016 82.1  78.0 - 100.0 fL Final  . MCH 03/22/2016 24.5* 26.0 - 34.0 pg Final  . MCHC 03/22/2016 29.8* 30.0 - 36.0 g/dL Final  . RDW 03/22/2016 15.5  11.5 - 15.5 % Final  . Platelets 03/22/2016 202  150 - 400 K/uL Final  . Prothrombin Time 03/22/2016 16.1* 11.4 - 15.2 seconds Final  . INR 03/22/2016 1.29   Final  . Specimen Description 03/22/2016 ABSCESS ABDOMEN   Final  . Special Requests 03/22/2016 Normal   Final  . Gram Stain 03/22/2016    Final                   Value:ABUNDANT WBC PRESENT,BOTH PMN AND MONONUCLEAR RARE GRAM NEGATIVE RODS   . Culture 03/22/2016    Final                   Value:FEW ESCHERICHIA COLI NO ANAEROBES ISOLATED   . Report Status 03/22/2016 03/27/2016 FINAL   Final  . Organism ID, Bacteria 03/22/2016 ESCHERICHIA COLI   Final     Imaging: Dg Chest 2 View  Result Date: 08/30/2016 CLINICAL DATA:  Shortness of breath since yesterday. Left lower rib pain. History of pulmonary embolus 3 years ago. EXAM: CHEST  2 VIEW COMPARISON:  01/25/2016 FINDINGS: Normal heart size and pulmonary vascularity. No focal airspace disease or consolidation in the lungs. No blunting of costophrenic angles. No pneumothorax. Mediastinal contours appear intact. Degenerative changes in the  spine. IMPRESSION: No active cardiopulmonary disease. Electronically Signed   By: Lucienne Capers M.D.   On: 08/30/2016 18:34   Ct Angio Chest Pe W/cm &/or Wo Cm  Result Date: 08/31/2016 CLINICAL DATA:  Shortness of breath for 3 days. EXAM: CT ANGIOGRAPHY CHEST WITH CONTRAST TECHNIQUE: Multidetector CT imaging of the chest was performed using the standard protocol during bolus administration of intravenous contrast. Multiplanar CT image reconstructions and MIPs were obtained to evaluate the vascular anatomy. CONTRAST:  100 cc Isovue 370 intravenous contrast. COMPARISON:  Chest x-ray dated August 30, 2016; CT chest dated Jun 14, 2013. FINDINGS: Cardiovascular: Satisfactory opacification of the pulmonary arteries to the segmental level. No evidence of pulmonary embolism. Normal heart size. No pericardial effusion. No evidence of right heart strain. Mediastinum/Nodes: No enlarged mediastinal, hilar, or axillary lymph nodes. Thyroid gland, trachea, and esophagus demonstrate no significant findings. Lungs/Pleura: Bibasilar atelectasis. Lungs are otherwise clear. No pleural effusion or pneumothorax. Upper Abdomen: No acute abnormality. Cholecystectomy. Status post gastric lap band surgery. Musculoskeletal: No chest wall abnormality. No acute or significant osseous findings. Stable degenerative changes of the thoracic spine.  Review of the MIP images confirms the above findings. IMPRESSION: 1. No evidence of pulmonary embolism. 2. No acute intrathoracic process. Electronically Signed   By: Titus Dubin M.D.   On: 08/31/2016 10:42    Speciality Comments: No specialty comments available.    Procedures:  No procedures performed Allergies: Contrast media [iodinated diagnostic agents]; Penicillins; and Ancef [cefazolin sodium]   Assessment / Plan:     Visit Diagnoses: No diagnosis found.    Plan: #1: Rheumatoid arthritis with positive rheumatoid factor, negative ANA, negative CCP. History of right second  MCP synovial thickening with mild tenderness. None on today's visit.   #2: High-risk prescription Plaquenil 200 mg twice a day Monday through Friday We do not have documentation from any recent Plaquenil eye exam from Dr. Kellie Moor office, review of epic reveals no recent visit to Dr. Gershon Crane.; We have documentation of baseline eye exam done at [facility]  Patient has had labs done July 2018 and is within normal limits except mild anemia. Patient has been advised to discuss this with her PCP if it doesn't resolve over time. She recently had left total knee replacement which explains why she has some anemia.  I will refill the Plaquenil today with a 90 day supply if the patient needs however she will need to get Plaquenil eye exam updated before we can give any more refills after this refill.  #3: History of left knee pain and swelling for which she received a cortisone injection on the May visit. Then she had left total knee replacement through Dr. Pryor Montes office. She has pain between one and 7 on a scale of 0-10 off and on. She is currently doing physical therapy.  #4: Patient's cardiologist is Dr. Katharina Caper; she was supposed to visit him for peripheral edema . He advised her that everything is fine with her peripheral edema and she should wear compression stockings.   #5: Return to clinic in 5 months  Orders: No orders of the defined types were placed in this encounter.  No orders of the defined types were placed in this encounter.   Face-to-face time spent with patient was 30 minutes. 50% of time was spent in counseling and coordination of care.  Follow-Up Instructions: No Follow-up on file.   Eliezer Lofts, PA-C  Note - This record has been created using [employer].  Chart creation errors have been sought, but may not always  have been located. Such creation errors do not reflect on  the standard of medical care.

## 2016-09-17 DIAGNOSIS — R262 Difficulty in walking, not elsewhere classified: Secondary | ICD-10-CM | POA: Diagnosis not present

## 2016-09-17 DIAGNOSIS — M25562 Pain in left knee: Secondary | ICD-10-CM | POA: Diagnosis not present

## 2016-09-18 ENCOUNTER — Ambulatory Visit: Payer: 59 | Admitting: Cardiovascular Disease

## 2016-09-21 DIAGNOSIS — R262 Difficulty in walking, not elsewhere classified: Secondary | ICD-10-CM | POA: Diagnosis not present

## 2016-09-21 DIAGNOSIS — M25562 Pain in left knee: Secondary | ICD-10-CM | POA: Diagnosis not present

## 2016-09-24 ENCOUNTER — Encounter (INDEPENDENT_AMBULATORY_CARE_PROVIDER_SITE_OTHER): Payer: Self-pay | Admitting: Physician Assistant

## 2016-09-24 ENCOUNTER — Ambulatory Visit (INDEPENDENT_AMBULATORY_CARE_PROVIDER_SITE_OTHER): Payer: 59 | Admitting: Physician Assistant

## 2016-09-24 DIAGNOSIS — Z96652 Presence of left artificial knee joint: Secondary | ICD-10-CM

## 2016-09-24 NOTE — Progress Notes (Addendum)
[name] is now 36 days status post left total knee arthroplasty. She states that overall her knee is slowly improving. She's working with physical therapy, their measurements are negative 4 to 102 of flexion. Main complaint is shortness of breath. She actually went to the emergency room and had a workup that showed basically some anemia which is been acute on chronic. Her hemoglobin was 9.6 on 08/25/2015 in the ER. She also had a chest CT angiogram which was negative for PE. At her husband's visit with their primary care physician, she states that the provider felt that she appeared pale. She was given some iron tablets samples. She has had no formal workup of her anemia.  Physical exam: Left knee surgical incisions well-healed no signs of infection. Left calf supple non tender. Full extension and flexion to 105. No instability with valgus/varus stressing.  Impression: Status post left total knee arthroplasty Acute on chronic anemia  Plan: She will continue to work on range of motion and strengthening of her knee. Did discuss with her to  make an appointment with her primary care physician for workup of her shortness of breath and  Anemia.Follow up with Dr. Ninfa Linden in one month.

## 2016-09-25 ENCOUNTER — Other Ambulatory Visit (INDEPENDENT_AMBULATORY_CARE_PROVIDER_SITE_OTHER): Payer: Self-pay

## 2016-09-25 ENCOUNTER — Telehealth (INDEPENDENT_AMBULATORY_CARE_PROVIDER_SITE_OTHER): Payer: Self-pay | Admitting: Orthopaedic Surgery

## 2016-09-25 DIAGNOSIS — R262 Difficulty in walking, not elsewhere classified: Secondary | ICD-10-CM | POA: Diagnosis not present

## 2016-09-25 DIAGNOSIS — M25562 Pain in left knee: Secondary | ICD-10-CM | POA: Diagnosis not present

## 2016-09-25 MED ORDER — AMOXICILLIN 500 MG PO TABS: ORAL_TABLET | ORAL | 0 refills | Status: DC

## 2016-09-25 NOTE — Telephone Encounter (Signed)
Patient called needing a RX for antibiotics sent to the [facility] in [place] for her dentist appointment tomorrow. If you could give her a call when it's been called in. Thank you. CB # [phone]

## 2016-09-25 NOTE — Telephone Encounter (Signed)
Called into pharmacy

## 2016-09-26 ENCOUNTER — Telehealth (INDEPENDENT_AMBULATORY_CARE_PROVIDER_SITE_OTHER): Payer: Self-pay | Admitting: Orthopaedic Surgery

## 2016-09-26 NOTE — Telephone Encounter (Signed)
Patient called requesting copy of 09/24/16 OV note. Ready at front desk to pick up

## 2016-09-27 DIAGNOSIS — R262 Difficulty in walking, not elsewhere classified: Secondary | ICD-10-CM | POA: Diagnosis not present

## 2016-09-27 DIAGNOSIS — M25562 Pain in left knee: Secondary | ICD-10-CM | POA: Diagnosis not present

## 2016-10-01 DIAGNOSIS — R262 Difficulty in walking, not elsewhere classified: Secondary | ICD-10-CM | POA: Diagnosis not present

## 2016-10-01 DIAGNOSIS — M25562 Pain in left knee: Secondary | ICD-10-CM | POA: Diagnosis not present

## 2016-10-03 DIAGNOSIS — M25562 Pain in left knee: Secondary | ICD-10-CM | POA: Diagnosis not present

## 2016-10-03 DIAGNOSIS — R262 Difficulty in walking, not elsewhere classified: Secondary | ICD-10-CM | POA: Diagnosis not present

## 2016-10-05 DIAGNOSIS — D509 Iron deficiency anemia, unspecified: Secondary | ICD-10-CM | POA: Diagnosis not present

## 2016-10-05 DIAGNOSIS — Z7901 Long term (current) use of anticoagulants: Secondary | ICD-10-CM | POA: Diagnosis not present

## 2016-10-05 DIAGNOSIS — I2699 Other pulmonary embolism without acute cor pulmonale: Secondary | ICD-10-CM | POA: Diagnosis not present

## 2016-10-09 DIAGNOSIS — M25562 Pain in left knee: Secondary | ICD-10-CM | POA: Diagnosis not present

## 2016-10-09 DIAGNOSIS — R262 Difficulty in walking, not elsewhere classified: Secondary | ICD-10-CM | POA: Diagnosis not present

## 2016-10-12 DIAGNOSIS — M25562 Pain in left knee: Secondary | ICD-10-CM | POA: Diagnosis not present

## 2016-10-12 DIAGNOSIS — R262 Difficulty in walking, not elsewhere classified: Secondary | ICD-10-CM | POA: Diagnosis not present

## 2016-10-15 DIAGNOSIS — R262 Difficulty in walking, not elsewhere classified: Secondary | ICD-10-CM | POA: Diagnosis not present

## 2016-10-15 DIAGNOSIS — M25562 Pain in left knee: Secondary | ICD-10-CM | POA: Diagnosis not present

## 2016-10-18 DIAGNOSIS — R262 Difficulty in walking, not elsewhere classified: Secondary | ICD-10-CM | POA: Diagnosis not present

## 2016-10-18 DIAGNOSIS — M25562 Pain in left knee: Secondary | ICD-10-CM | POA: Diagnosis not present

## 2016-10-22 ENCOUNTER — Encounter (INDEPENDENT_AMBULATORY_CARE_PROVIDER_SITE_OTHER): Payer: Self-pay | Admitting: Physician Assistant

## 2016-10-22 ENCOUNTER — Ambulatory Visit (INDEPENDENT_AMBULATORY_CARE_PROVIDER_SITE_OTHER): Payer: 59 | Admitting: Physician Assistant

## 2016-10-22 DIAGNOSIS — Z96652 Presence of left artificial knee joint: Secondary | ICD-10-CM

## 2016-10-22 MED ORDER — TRAMADOL HCL 50 MG PO TABS: 50.0000 mg | ORAL_TABLET | Freq: Four times a day (QID) | ORAL | 1 refills | Status: DC | PRN

## 2016-10-22 NOTE — Progress Notes (Signed)
Office Visit Note   Patient: [name]           Date of Birth: Nov 10, 1960           MRN: [id number] Visit Date: 10/22/2016              Requested by: Mateo Flow, MD [place], [place] [address] PCP: Mateo Flow, MD   Assessment & Plan: Visit Diagnoses:  1. Status post total left knee replacement     Plan: She'll continue to work with therapy to work on range of motion gait balance. We'll see her back in 6 months postop disc due to check and see how well he is doing. Place her on tramadol pain. Really did not hurt taking Celebrex even intermittently as she is on long-term Xarelto.  Follow-Up Instructions: Return in about 4 months (around 02/21/2017).   Orders:  No orders of the defined types were placed in this encounter.  Meds ordered this encounter  Medications  . traMADol (ULTRAM) 50 MG tablet    Sig: Take 1 tablet (50 mg total) by mouth every 6 (six) hours as needed.    Dispense:  30 tablet    Refill:  1      Procedures: No procedures performed   Clinical Data: No additional findings.   Subjective: Chief Complaint  Patient presents with  . Left Knee - Follow-up, Routine Post Op    HPI [name] returns today for follow-up of her left total knee. She states overall she is doing better. She did see her primary care physician for anemia. Hemoglobin was 10.6. She continues with oral iron. She is not taking any narcotics. She did take some Celebrex which she had left over from previous prescription and is really helped. She is asking for a refill on Celebrex today. She continues to work with physical therapy for range of motion strengthening the knee.  Review of Systems   Objective: Vital Signs: There were no vitals taken for this visit.  Physical Exam  Constitutional: She is oriented to person, place, and time. She appears well-developed and well-nourished. No distress.  Pulmonary/Chest: Effort normal.  Neurological: She is alert  and oriented to person, place, and time.  Skin: She is not diaphoretic.  Psychiatric: She has a normal mood and affect.    Ortho Exam ambulates with a slight antalgic gait without any assistive device. She has full extension of the left knee full flexion and no instability valgus varus stressing surgical incisions healing well Specialty Comments:  No specialty comments available.  Imaging: No results found.   PMFS History: Patient Active Problem List   Diagnosis Date Noted  . Presence of left artificial knee joint 08/14/2016  . Unilateral primary osteoarthritis, left knee 07/31/2016  . Status post total knee replacement, left 07/31/2016  . Abdominal hematoma   . Post-operative state   . Paroxysmal atrial fibrillation (HCC)   . Controlled type 2 diabetes mellitus with diabetic nephropathy ([place])   . Wound infection 01/25/2016  . Hydronephrosis, left 01/25/2016  . Hydronephrosis, right 01/25/2016  . DVT (deep venous thrombosis) ([place]) 01/25/2016  . Atrial fibrillation ([place]) 01/25/2016  . Diabetes mellitus with complication ([place])   . Wound infection after surgery, initial encounter   . Varicose veins of lower extremities with complications 07/37/1062  . Pain and swelling of left lower leg 10/27/2013  . Pain of left lower extremity 10/27/2013  . Gastroenteritis, acute 09/08/2013  . Fever, unspecified 09/04/2013  .  ASD (atrial septal defect) 08/13/2013  . Pulmonary embolism ([place]) 06/22/2013  . Dyspnea 06/11/2013  . Anterior slip repaired Oct 2012 01/18/2011  . GI bleed 12/18/2010  . Diabetes mellitus ([place]) 12/18/2010  . Varicose veins of lower extremities with other complications 16/38/4536  . Diabetes mellitus   . HIP PAIN, RIGHT 03/06/2010   Past Medical History:  Diagnosis Date  . Anemia   . Arthritis   . Atrial fibrillation ([place])   . Bleeding ulcer 12-2010  hospitalized for 1 week  . Blood transfusion   . Blood transfusion without reported diagnosis    2011-2 units  transfused, bleeding ulcer  . Bruises easily   . CHF (congestive heart failure) ([place])    with PE resolved after   . Constipation   . Difficulty in swallowing 03-24-13   no longer a problem  . DVT (deep venous thrombosis) ([place]) 06-2013   right leg  . Dysrhythmia     Hx of A fib   . GERD (gastroesophageal reflux disease)   . Hyperlipidemia   . Leg swelling 03-24-13   not a problem any longer  . Peripheral vascular disease ([place])   . Pulmonary embolus ([place]) 06-2013  . Reflux   . Shortness of breath    with PE  . Vomiting 03-24-13   problem resolved after lap band revision    Family History  Problem Relation Age of Onset  . Heart disease Father   . Kidney disease Father   . Peripheral vascular disease Father   . Heart attack Father        65  . Diabetes Mother   . Arthritis Mother     Past Surgical History:  Procedure Laterality Date  . APPENDECTOMY    . CARPEL TUNNEL BILATEREAL    . CHOLECYSTECTOMY     5'12  . COLONOSCOPY N/A 04/10/2013   Procedure: COLONOSCOPY;  Surgeon: Beryle Beams, MD;  Location: [facility];  Service: Endoscopy;  Laterality: N/A;  . DIAGNOSTIC LAPAROSCOPY    . ENDOVENOUS ABLATION SAPHENOUS VEIN W/ LASER  06-2008  . ENDOVENOUS ABLATION SAPHENOUS VEIN W/ LASER Left 12-09-2013   EVLA LEFT GREATER SAPHENOUS VEIN BY TODD EARLY MD  . ESOPHAGOGASTRODUODENOSCOPY  12/18/2010   Procedure: ESOPHAGOGASTRODUODENOSCOPY (EGD);  Surgeon: Gatha Mayer, MD;  [facility];  Service: Endoscopy;  Laterality: N/A;  . JOINT REPLACEMENT    . LAPAROSCOPIC GASTRIC BANDING  01-05-2008  . LAPAROSCOPIC GASTRIC BANDING  12/18/2010   01/04/2009  . SALPHINGOOOPHORECTOMY Right    cyst removal  . TONSILLECTOMY    . TOTAL KNEE ARTHROPLASTY Left 07/31/2016   Procedure: LEFT TOTAL KNEE ARTHROPLASTY;  Surgeon: [name], MD;  Location: [facility];  Service: Orthopedics;  Laterality: Left;  . WEDGE RESECTION OF OVARY  LEFT OVARY    Social History    Occupational History  . RN [employer]   Social History Main Topics  . Smoking status: Never Smoker  . Smokeless tobacco: Never Used  . Alcohol use No  . Drug use: No  . Sexual activity: Yes    Partners: Male    Birth control/ protection: None     Comment: declined condoms

## 2016-10-23 DIAGNOSIS — M25562 Pain in left knee: Secondary | ICD-10-CM | POA: Diagnosis not present

## 2016-10-23 DIAGNOSIS — R262 Difficulty in walking, not elsewhere classified: Secondary | ICD-10-CM | POA: Diagnosis not present

## 2016-10-25 DIAGNOSIS — Z79899 Other long term (current) drug therapy: Secondary | ICD-10-CM | POA: Diagnosis not present

## 2016-10-25 DIAGNOSIS — M25562 Pain in left knee: Secondary | ICD-10-CM | POA: Diagnosis not present

## 2016-10-25 DIAGNOSIS — R262 Difficulty in walking, not elsewhere classified: Secondary | ICD-10-CM | POA: Diagnosis not present

## 2016-11-01 DIAGNOSIS — R262 Difficulty in walking, not elsewhere classified: Secondary | ICD-10-CM | POA: Diagnosis not present

## 2016-11-01 DIAGNOSIS — M25562 Pain in left knee: Secondary | ICD-10-CM | POA: Diagnosis not present

## 2016-11-05 DIAGNOSIS — M25562 Pain in left knee: Secondary | ICD-10-CM | POA: Diagnosis not present

## 2016-11-05 DIAGNOSIS — R262 Difficulty in walking, not elsewhere classified: Secondary | ICD-10-CM | POA: Diagnosis not present

## 2016-11-08 DIAGNOSIS — R262 Difficulty in walking, not elsewhere classified: Secondary | ICD-10-CM | POA: Diagnosis not present

## 2016-11-08 DIAGNOSIS — M25562 Pain in left knee: Secondary | ICD-10-CM | POA: Diagnosis not present

## 2016-11-11 ENCOUNTER — Other Ambulatory Visit: Payer: Self-pay | Admitting: Rheumatology

## 2016-11-12 NOTE — Telephone Encounter (Addendum)
Last Visit: 09/14/16 Next Visit: 02/18/17 Labs: 08/30/16 Hgb 9.6 Previously  8.5 PLQ Eye Exam: No exam noted.  Left message for patient to advise we need PLQ eye exam.  Ok to refill 30 day supply of PLQ?

## 2016-11-15 NOTE — Telephone Encounter (Signed)
Cannot refill Plaquenil until we get eye exam

## 2016-12-07 DIAGNOSIS — Z79899 Other long term (current) drug therapy: Secondary | ICD-10-CM | POA: Diagnosis not present

## 2016-12-17 DIAGNOSIS — N760 Acute vaginitis: Secondary | ICD-10-CM | POA: Diagnosis not present

## 2017-01-04 DIAGNOSIS — H40023 Open angle with borderline findings, high risk, bilateral: Secondary | ICD-10-CM | POA: Diagnosis not present

## 2017-01-23 DIAGNOSIS — L0390 Cellulitis, unspecified: Secondary | ICD-10-CM | POA: Diagnosis not present

## 2017-01-30 DIAGNOSIS — Z79899 Other long term (current) drug therapy: Secondary | ICD-10-CM | POA: Diagnosis not present

## 2017-01-30 DIAGNOSIS — D509 Iron deficiency anemia, unspecified: Secondary | ICD-10-CM | POA: Diagnosis not present

## 2017-02-04 NOTE — Progress Notes (Deleted)
Office Visit Note  Patient: [name]             Date of Birth: April 06, 1960           MRN: [id number]             PCP: Mateo Flow, MD Referring: Mateo Flow, MD Visit Date: 02/18/2017 Occupation: @GUAROCC @    Subjective:  No chief complaint on file.   History of Present Illness: [name] is a 56 y.o. female ***   Activities of Daily Living:  Patient reports morning stiffness for *** {minute/hour:19697}.   Patient {ACTIONS;DENIES/REPORTS:21021675::"Denies"} nocturnal pain.  Difficulty dressing/grooming: {ACTIONS;DENIES/REPORTS:21021675::"Denies"} Difficulty climbing stairs: {ACTIONS;DENIES/REPORTS:21021675::"Denies"} Difficulty getting out of chair: {ACTIONS;DENIES/REPORTS:21021675::"Denies"} Difficulty using hands for taps, buttons, cutlery, and/or writing: {ACTIONS;DENIES/REPORTS:21021675::"Denies"}   No Rheumatology ROS completed.   PMFS History:  Patient Active Problem List   Diagnosis Date Noted  . Presence of left artificial knee joint 08/14/2016  . Unilateral primary osteoarthritis, left knee 07/31/2016  . Status post total knee replacement, left 07/31/2016  . Abdominal hematoma   . Post-operative state   . Paroxysmal atrial fibrillation (HCC)   . Controlled type 2 diabetes mellitus with diabetic nephropathy ([place])   . Wound infection 01/25/2016  . Hydronephrosis, left 01/25/2016  . Hydronephrosis, right 01/25/2016  . DVT (deep venous thrombosis) ([place]) 01/25/2016  . Atrial fibrillation ([place]) 01/25/2016  . Diabetes mellitus with complication ([place])   . Wound infection after surgery, initial encounter   . Varicose veins of lower extremities with complications 68/04/2120  . Pain and swelling of left lower leg 10/27/2013  . Pain of left lower extremity 10/27/2013  . Gastroenteritis, acute 09/08/2013  . Fever, unspecified 09/04/2013  . ASD (atrial septal defect) 08/13/2013  . Pulmonary embolism ([place]) 06/22/2013  . Dyspnea 06/11/2013  .  Anterior slip repaired Oct 2012 01/18/2011  . GI bleed 12/18/2010  . Diabetes mellitus ([place]) 12/18/2010  . Varicose veins of lower extremities with other complications 48/25/0037  . Diabetes mellitus   . HIP PAIN, RIGHT 03/06/2010    Past Medical History:  Diagnosis Date  . Anemia   . Arthritis   . Atrial fibrillation ([place])   . Bleeding ulcer 12-2010  hospitalized for 1 week  . Blood transfusion   . Blood transfusion without reported diagnosis    2011-2 units transfused, bleeding ulcer  . Bruises easily   . CHF (congestive heart failure) ([place])    with PE resolved after   . Constipation   . Difficulty in swallowing 03-24-13   no longer a problem  . DVT (deep venous thrombosis) ([place]) 06-2013   right leg  . Dysrhythmia     Hx of A fib   . GERD (gastroesophageal reflux disease)   . Hyperlipidemia   . Leg swelling 03-24-13   not a problem any longer  . Peripheral vascular disease ([place])   . Pulmonary embolus ([place]) 06-2013  . Reflux   . Shortness of breath    with PE  . Vomiting 03-24-13   problem resolved after lap band revision    Family History  Problem Relation Age of Onset  . Heart disease Father   . Kidney disease Father   . Peripheral vascular disease Father   . Heart attack Father        61  . Diabetes Mother   . Arthritis Mother    Past Surgical History:  Procedure Laterality Date  . APPENDECTOMY    . CARPEL TUNNEL BILATEREAL    .  CHOLECYSTECTOMY     5'12  . COLONOSCOPY N/A 04/10/2013   Procedure: COLONOSCOPY;  Surgeon: Beryle Beams, MD;  [facility];  Service: Endoscopy;  Laterality: N/A;  . DIAGNOSTIC LAPAROSCOPY    . ENDOVENOUS ABLATION SAPHENOUS VEIN W/ LASER  06-2008  . ENDOVENOUS ABLATION SAPHENOUS VEIN W/ LASER Left 12-09-2013   EVLA LEFT GREATER SAPHENOUS VEIN BY TODD EARLY MD  . ESOPHAGOGASTRODUODENOSCOPY  12/18/2010   Procedure: ESOPHAGOGASTRODUODENOSCOPY (EGD);  Surgeon: Gatha Mayer, MD;  Location: [facility];  Service:  Endoscopy;  Laterality: N/A;  . JOINT REPLACEMENT    . LAPAROSCOPIC GASTRIC BANDING  01-05-2008  . LAPAROSCOPIC GASTRIC BANDING  12/18/2010   01/04/2009  . SALPHINGOOOPHORECTOMY Right    cyst removal  . TONSILLECTOMY    . TOTAL KNEE ARTHROPLASTY Left 07/31/2016   Procedure: LEFT TOTAL KNEE ARTHROPLASTY;  Surgeon: [name], MD;  Location: [facility];  Service: Orthopedics;  Laterality: Left;  . WEDGE RESECTION OF OVARY  LEFT OVARY    Social History   Social History Narrative  . Not on file     Objective: Vital Signs: There were no vitals taken for this visit.   Physical Exam   Musculoskeletal Exam: ***  CDAI Exam: No CDAI exam completed.    Investigation: No additional findings. PLQ eye exam: 12/07/2016  CBC Latest Ref Rng & Units 08/30/2016 08/02/2016 08/01/2016  WBC 4.0 - 10.5 K/uL 4.1 7.7 7.0  Hemoglobin 12.0 - 15.0 g/dL 9.6(L) 8.5(L) 8.6(L)  Hematocrit 36.0 - 46.0 % 33.4(L) 29.6(L) 30.1(L)  Platelets 150 - 400 K/uL 203 185 166   CMP Latest Ref Rng & Units 08/30/2016 08/01/2016 07/25/2016  Glucose 65 - 99 mg/dL 96 111(H) 96  BUN 6 - 20 mg/dL 12 11 16   Creatinine 0.44 - 1.00 mg/dL 0.63 0.70 0.64  Sodium 135 - 145 mmol/L 141 139 142  Potassium 3.5 - 5.1 mmol/L 3.7 3.8 4.4  Chloride 101 - 111 mmol/L 108 108 109  CO2 22 - 32 mmol/L 25 26 28   Calcium 8.9 - 10.3 mg/dL 9.2 8.0(L) 9.2  Total Protein 6.5 - 8.1 g/dL 6.8 - -  Total Bilirubin 0.3 - 1.2 mg/dL 0.4 - -  Alkaline Phos 38 - 126 U/L 69 - -  AST 15 - 41 U/L 18 - -  ALT 14 - 54 U/L 12(L) - -    Imaging: No results found.  Speciality Comments: PLQ eye exam: 12/07/2016 Normal. [facility] Follow up in 1 year.    Procedures:  No procedures performed Allergies: Contrast media [iodinated diagnostic agents]; Penicillins; and Ancef [cefazolin sodium]   Assessment / Plan:     Visit Diagnoses: No diagnosis found.    Orders: No orders of the defined types were placed in this encounter.  No orders of  the defined types were placed in this encounter.   Face-to-face time spent with patient was *** minutes. 50% of time was spent in counseling and coordination of care.  Follow-Up Instructions: No Follow-up on file.   Earnestine Mealing, CMA  Note - This record has been created using Editor, commissioning.  Chart creation errors have been sought, but may not always  have been located. Such creation errors do not reflect on  the standard of medical care.

## 2017-02-14 DIAGNOSIS — L0390 Cellulitis, unspecified: Secondary | ICD-10-CM | POA: Diagnosis not present

## 2017-02-18 ENCOUNTER — Ambulatory Visit: Payer: 59 | Admitting: Rheumatology

## 2017-02-25 ENCOUNTER — Ambulatory Visit (INDEPENDENT_AMBULATORY_CARE_PROVIDER_SITE_OTHER): Payer: 59 | Admitting: Physician Assistant

## 2017-02-25 ENCOUNTER — Encounter (INDEPENDENT_AMBULATORY_CARE_PROVIDER_SITE_OTHER): Payer: Self-pay | Admitting: Physician Assistant

## 2017-02-25 DIAGNOSIS — M7061 Trochanteric bursitis, right hip: Secondary | ICD-10-CM

## 2017-02-25 DIAGNOSIS — Z96652 Presence of left artificial knee joint: Secondary | ICD-10-CM

## 2017-02-25 NOTE — Progress Notes (Signed)
Office Visit Note   Patient: [name]           Date of Birth: [birth year]           MRN: [id number] Visit Date: 02/25/2017              Requested by: Mateo Flow, MD [place], [place] [address] PCP: Mateo Flow, MD   Assessment & Plan: Visit Diagnoses:  1. Status post total left knee replacement   2. Trochanteric bursitis, right hip     Plan: She will perform IT band stretching exercises.  We will see her back at 1 year postop in June and at that time obtain AP and lateral views of her left knee.  She will follow-up sooner if there is any questions or concerns.  Follow-Up Instructions: Return in about 5 months (around 07/26/2017) for Radiographs.   Orders:  Orders Placed This Encounter  Procedures  . Large Joint Inj  . Large Joint Inj   No orders of the defined types were placed in this encounter.     Procedures: Large Joint Inj: R greater trochanter on 02/25/2017 8:25 AM Indications: pain Details: 22 G 1.5 in needle, lateral approach  Arthrogram: No  Medications: 3 mL lidocaine 1 %; 40 mg methylPREDNISolone acetate 40 MG/ML Outcome: tolerated well, no immediate complications Procedure, treatment alternatives, risks and benefits explained, specific risks discussed. Consent was given by the patient. Immediately prior to procedure a time out was called to verify the correct patient, procedure, equipment, support staff and site/side marked as required. Patient was prepped and draped in the usual sterile fashion.       Clinical Data: No additional findings.   Subjective: Chief Complaint  Patient presents with  . Left Knee - Follow-up  . Right Leg - Pain    HPI [name] returns today now 7 months status post left total knee arthroplasty.  She states the knee overall is doing well.  Main complaint is right hip pain.  She had trochanteric bursitis in the past is asking if she could get an injection today.  She denies any radicular  symptoms down the right leg.  She does have some periodic back pain. Review of Systems See HPI otherwise negative  Objective: Vital Signs: There were no vitals taken for this visit.  Physical Exam  Constitutional: She is oriented to person, place, and time. She appears well-developed and well-nourished. No distress.  Neurological: She is alert and oriented to person, place, and time.  Skin: She is not diaphoretic.  Psychiatric: She has a normal mood and affect.    Ortho Exam She ambulates without any assistive device.  Left knee full extension flexion to at least 110 degrees.  No instability with valgus varus stressing.  Surgical incisions healing well no signs of infection.  Right hip good range of motion tenderness over the right trochanteric region. Specialty Comments:  No specialty comments available.  Imaging: No results found.   PMFS History: Patient Active Problem List   Diagnosis Date Noted  . Presence of left artificial knee joint 08/14/2016  . Unilateral primary osteoarthritis, left knee 07/31/2016  . Status post total knee replacement, left 07/31/2016  . Abdominal hematoma   . Post-operative state   . Paroxysmal atrial fibrillation (HCC)   . Controlled type 2 diabetes mellitus with diabetic nephropathy ([place])   . Wound infection 01/25/2016  . Hydronephrosis, left 01/25/2016  . Hydronephrosis, right 01/25/2016  . DVT (deep venous  thrombosis) ([place]) 01/25/2016  . Atrial fibrillation ([place]) 01/25/2016  . Diabetes mellitus with complication ([place])   . Wound infection after surgery, initial encounter   . Varicose veins of lower extremities with complications 44/04/4740  . Pain and swelling of left lower leg 10/27/2013  . Pain of left lower extremity 10/27/2013  . Gastroenteritis, acute 09/08/2013  . Fever, unspecified 09/04/2013  . ASD (atrial septal defect) 08/13/2013  . Pulmonary embolism ([place]) 06/22/2013  . Dyspnea 06/11/2013  . Anterior slip repaired Oct 2012  01/18/2011  . GI bleed 12/18/2010  . Diabetes mellitus ([place]) 12/18/2010  . Varicose veins of lower extremities with other complications 59/56/3875  . Diabetes mellitus   . HIP PAIN, RIGHT 03/06/2010   Past Medical History:  Diagnosis Date  . Anemia   . Arthritis   . Atrial fibrillation ([place])   . Bleeding ulcer 12-2010  hospitalized for 1 week  . Blood transfusion   . Blood transfusion without reported diagnosis    2011-2 units transfused, bleeding ulcer  . Bruises easily   . CHF (congestive heart failure) ([place])    with PE resolved after   . Constipation   . Difficulty in swallowing 03-24-13   no longer a problem  . DVT (deep venous thrombosis) ([place]) 06-2013   right leg  . Dysrhythmia     Hx of A fib   . GERD (gastroesophageal reflux disease)   . Hyperlipidemia   . Leg swelling 03-24-13   not a problem any longer  . Peripheral vascular disease ([place])   . Pulmonary embolus ([place]) 06-2013  . Reflux   . Shortness of breath    with PE  . Vomiting 03-24-13   problem resolved after lap band revision    Family History  Problem Relation Age of Onset  . Heart disease Father   . Kidney disease Father   . Peripheral vascular disease Father   . Heart attack Father        31  . Diabetes Mother   . Arthritis Mother     Past Surgical History:  Procedure Laterality Date  . APPENDECTOMY    . CARPEL TUNNEL BILATEREAL    . CHOLECYSTECTOMY     5'12  . COLONOSCOPY N/A 04/10/2013   Procedure: COLONOSCOPY;  Surgeon: Beryle Beams, MD;  [facility];  Service: Endoscopy;  Laterality: N/A;  . DIAGNOSTIC LAPAROSCOPY    . ENDOVENOUS ABLATION SAPHENOUS VEIN W/ LASER  06-2008  . ENDOVENOUS ABLATION SAPHENOUS VEIN W/ LASER Left 12-09-2013   EVLA LEFT GREATER SAPHENOUS VEIN BY TODD EARLY MD  . ESOPHAGOGASTRODUODENOSCOPY  12/18/2010   Procedure: ESOPHAGOGASTRODUODENOSCOPY (EGD);  Surgeon: Gatha Mayer, MD;  [facility];  Service: Endoscopy;  Laterality: N/A;  . JOINT  REPLACEMENT    . LAPAROSCOPIC GASTRIC BANDING  01-05-2008  . LAPAROSCOPIC GASTRIC BANDING  12/18/2010   01/04/2009  . SALPHINGOOOPHORECTOMY Right    cyst removal  . TONSILLECTOMY    . TOTAL KNEE ARTHROPLASTY Left 07/31/2016   Procedure: LEFT TOTAL KNEE ARTHROPLASTY;  Surgeon: [name], MD;  [facility];  Service: Orthopedics;  Laterality: Left;  . WEDGE RESECTION OF OVARY  LEFT OVARY    Social History   Occupational History  . Occupation: Programmer, multimedia: [employer]  Tobacco Use  . Smoking status: Never Smoker  . Smokeless tobacco: Never Used  Substance and Sexual Activity  . Alcohol use: No    Alcohol/week: 0.0 oz  . Drug use: No  .  Sexual activity: Yes    Partners: Male    Birth control/protection: None    Comment: declined condoms

## 2017-02-26 MED ORDER — METHYLPREDNISOLONE ACETATE 40 MG/ML IJ SUSP
40.0000 mg | INTRAMUSCULAR | Status: AC | PRN
  Administered 2017-02-25: 40 mg via INTRA_ARTICULAR

## 2017-02-26 MED ORDER — LIDOCAINE HCL 1 % IJ SOLN
3.0000 mL | INTRAMUSCULAR | Status: AC | PRN
  Administered 2017-02-25: 3 mL

## 2017-02-28 DIAGNOSIS — Z1159 Encounter for screening for other viral diseases: Secondary | ICD-10-CM | POA: Diagnosis not present

## 2017-03-07 DIAGNOSIS — N762 Acute vulvitis: Secondary | ICD-10-CM | POA: Diagnosis not present

## 2017-04-24 ENCOUNTER — Encounter: Payer: Self-pay | Admitting: Physician Assistant

## 2017-04-24 ENCOUNTER — Ambulatory Visit (INDEPENDENT_AMBULATORY_CARE_PROVIDER_SITE_OTHER): Payer: 59 | Admitting: Physician Assistant

## 2017-04-24 VITALS — BP 130/83 | HR 69 | Ht 66.0 in | Wt 281.8 lb

## 2017-04-24 DIAGNOSIS — D649 Anemia, unspecified: Secondary | ICD-10-CM

## 2017-04-24 DIAGNOSIS — Z7901 Long term (current) use of anticoagulants: Secondary | ICD-10-CM | POA: Diagnosis not present

## 2017-04-24 DIAGNOSIS — I480 Paroxysmal atrial fibrillation: Secondary | ICD-10-CM

## 2017-04-24 DIAGNOSIS — Z86711 Personal history of pulmonary embolism: Secondary | ICD-10-CM | POA: Diagnosis not present

## 2017-04-24 NOTE — Patient Instructions (Signed)
Medication Instructions:  1. Your physician recommends that you continue on your current medications as directed. Please refer to the Current Medication list given to you today.   Labwork: TODAY BMET, CBC, TSH  Testing/Procedures: NONE ORDERED TODAY  Follow-Up: KEEP YOUR UPCOMING APPT WITH DR. Acie Fredrickson 07/2017  Any Other Special Instructions Will Be Listed Below (If Applicable).     If you need a refill on your cardiac medications before your next appointment, please call your pharmacy.

## 2017-04-24 NOTE — Progress Notes (Signed)
Cardiology Office Note:    Date:  04/24/2017   ID:  [name], DOB 07-Mar-1960, MRN [id number]  PCP:  Sherri Flow, MD  Cardiologist:  Sherri Moores, MD   Referring MD: Sherri Flow, MD   Chief Complaint  Patient presents with  . Atrial Fibrillation    History of Present Illness:    [name] is a 57 y.o. female with prior lower extremity DVT and pulmonary embolism (saddle embolus) in 2025 complicated by right heart strain treated with transcatheter thrombolytic infusion, paroxysmal atrial fibrillation, long-term anticoagulation.  [name] returns for the evaluation of atrial fibrillation.  She recently had influenza.  She took a few doses of Tylenol Cold during her illness.  Her flu symptoms were improving.  Several days ago, she developed nausea and vomiting shortly after eating.  She subsequently developed a rapid heart rate consistent with recurrent atrial fibrillation.  She took an extra dose of metoprolol.  After 4-1/2 hours, she decided to go the emergency room.  However, her rapid palpitations abated and she remained at home.  She has had one brief episode for a few seconds since then.  Prior to this episode, her recurrence of atrial fibrillation was quite rare.  She has chronic dyspnea without significant change.  She denies chest pain.  She denies PND.  She has chronic edema without change.  She denies syncope.  Prior CV studies:   The following studies were reviewed today:  Echo 06/01/04 Normal LV systolic function, normal wall motion  Echo 06/11/13 EF 55-60, normal wall motion, diastolic flattening consistent with RV volume overload, moderate RVE, moderate to severe RAE, atrial septal aneurysm, PASP 41  Past Medical History:  Diagnosis Date  . Anemia   . Arthritis   . Atrial fibrillation ([place])   . Bleeding ulcer 12-2010  hospitalized for 1 week  . Blood transfusion   . Blood transfusion without reported diagnosis    2011-2 units transfused, bleeding  ulcer  . Bruises easily   . CHF (congestive heart failure) ([place])    with PE resolved after   . Constipation   . Difficulty in swallowing 03-24-13   no longer a problem  . DVT (deep venous thrombosis) ([place]) 06-2013   right leg  . Dysrhythmia     Hx of A fib   . GERD (gastroesophageal reflux disease)   . Hyperlipidemia   . Leg swelling 03-24-13   not a problem any longer  . Peripheral vascular disease ([place])   . Pulmonary embolus ([place]) 06-2013  . Reflux   . Shortness of breath    with PE  . Vomiting 03-24-13   problem resolved after lap band revision   Surgical Hx: The patient  has a past surgical history that includes Laparoscopic gastric banding (01-05-2008); Endovenous ablation saphenous vein w/ laser (06-2008); SALPHINGOOOPHORECTOMY (Right); WEDGE RESECTION OF OVARY (LEFT OVARY ); CARPEL TUNNEL BILATEREAL; Diagnostic laparoscopy; Appendectomy; Laparoscopic gastric banding (12/18/2010); Esophagogastroduodenoscopy (12/18/2010); Tonsillectomy; Cholecystectomy; Colonoscopy (N/A, 04/10/2013); Endovenous ablation saphenous vein w/ laser (Left, 12-09-2013); Total knee arthroplasty (Left, 07/31/2016); and Joint replacement.   Current Medications: Current Meds  Medication Sig  . acetaminophen (TYLENOL) 325 MG tablet Take 650 mg by mouth every 6 (six) hours as needed for mild pain or moderate pain.  . IRON PO Take 1 tablet by mouth daily.  Marland Kitchen linaclotide (LINZESS) 145 MCG CAPS capsule Take 145 mcg by mouth daily as needed (for constipation).  . metoprolol tartrate (LOPRESSOR) 25 MG tablet Take 1  tablet (25 mg total) by mouth daily.  . Multiple Vitamin (MULTIVITAMIN) tablet Take 1 tablet by mouth daily.  Marland Kitchen omeprazole (PRILOSEC) 20 MG capsule Take 20 mg by mouth 2 (two) times daily before a meal.  . rivaroxaban (XARELTO) 20 MG TABS tablet Take 20 mg by mouth daily.      Allergies:   Contrast media [iodinated diagnostic agents]; Penicillins; and Ancef [cefazolin sodium]   Social History    Tobacco Use  . Smoking status: Never Smoker  . Smokeless tobacco: Never Used  Substance Use Topics  . Alcohol use: No    Alcohol/week: 0.0 oz  . Drug use: No     Family Hx: The patient's family history includes Arthritis in her mother; Diabetes in her mother; Heart attack in her father; Heart disease in her father; Kidney disease in her father; Peripheral vascular disease in her father.  ROS:   Please see the history of present illness.    Review of Systems  Constitution: Positive for chills, fever and malaise/fatigue.  Cardiovascular: Positive for irregular heartbeat and leg swelling.  Hematologic/Lymphatic: Bruises/bleeds easily.  Musculoskeletal: Positive for joint swelling.   All other systems reviewed and are negative.   EKGs/Labs/Other Test Reviewed:    EKG:  EKG is  ordered today.  The ekg ordered today demonstrates normal sinus rhythm, heart rate 69, incomplete right bundle branch block, QTC 480, no significant change  Recent Labs: 08/30/2016: ALT 12; B Natriuretic Peptide 18.5; BUN 12; Creatinine, Ser 0.63; Hemoglobin 9.6; Platelets 203; Potassium 3.7; Sodium 141   Recent Lipid Panel No results found for: CHOL, TRIG, HDL, CHOLHDL, LDLCALC, LDLDIRECT  Physical Exam:    VS:  BP 130/83   Pulse 69   Ht 5\' 6"  (1.676 m)   Wt 281 lb 12.8 oz (127.8 kg)   BMI 45.48 kg/m     Wt Readings from Last 3 Encounters:  04/24/17 281 lb 12.8 oz (127.8 kg)  09/14/16 279 lb (126.6 kg)  08/30/16 280 lb (127 kg)     Physical Exam  Constitutional: She is oriented to person, place, and time. She appears well-developed and well-nourished. No distress.  HENT:  Head: Normocephalic and atraumatic.  Neck: No JVD present.  Cardiovascular: Normal rate and regular rhythm.  No murmur heard. Pulmonary/Chest: Effort normal. She has no rales.  Abdominal: Soft.  Musculoskeletal: She exhibits edema (1-2+ bilat LE edema).  Neurological: She is alert and oriented to person, place, and  time.  Skin: Skin is warm and dry.    ASSESSMENT & PLAN:    #1.  Paroxysmal atrial fibrillation ([place])  She presents with recurrent atrial fibrillation several days ago that lasted about 4-1/2 hours.  She is back in sinus rhythm today.  Her episode of atrial fibrillation occurred in the context of recent influenza illness.  She also took medication that likely had a decongestant in it.  I suspect her recurrent atrial fibrillation is explained by all of this.  We discussed continuing with a strategy of watchful waiting.  If she has continued recurrent symptoms, she will need referral to the atrial fibrillation clinic for rhythm management.  Otherwise, if she has no recurrent episodes, she will follow-up with Dr. Acie Fredrickson in June as planned.  -Continue Xarelto, metoprolol  -Check TSH  #2.  History of pulmonary embolism Continue long-term anticoagulation.  #3.  Chronic anticoagulation Check follow-up BMET, CBC  #4.  Anemia Managed by PCP.  She is on Iron therapy.   Dispo:  Return in about 3  months (around 07/17/2017) for Scheduled Follow Up, w/ Dr. Acie Fredrickson.   Medication Adjustments/Labs and Tests Ordered: Current medicines are reviewed at length with the patient today.  Concerns regarding medicines are outlined above.  Tests Ordered: Orders Placed This Encounter  Procedures  . Basic metabolic panel  . CBC  . TSH  . EKG 12-Lead   Medication Changes: No orders of the defined types were placed in this encounter.   Signed, Richardson Dopp, PA-C  04/24/2017 3:25 PM    [facility] [place], [place], [place]  [address] Phone: [phone]; Fax: [phone]

## 2017-04-25 LAB — CBC
HEMATOCRIT: 44 % (ref 34.0–46.6)
Hemoglobin: 13.9 g/dL (ref 11.1–15.9)
MCH: 26.1 pg — ABNORMAL LOW (ref 26.6–33.0)
MCHC: 31.6 g/dL (ref 31.5–35.7)
MCV: 83 fL (ref 79–97)
Platelets: 239 10*3/uL (ref 150–379)
RBC: 5.32 x10E6/uL — ABNORMAL HIGH (ref 3.77–5.28)
RDW: 23.3 % — AB (ref 12.3–15.4)
WBC: 5.3 10*3/uL (ref 3.4–10.8)

## 2017-04-25 LAB — BASIC METABOLIC PANEL
BUN / CREAT RATIO: 18 (ref 9–23)
BUN: 11 mg/dL (ref 6–24)
CHLORIDE: 105 mmol/L (ref 96–106)
CO2: 24 mmol/L (ref 20–29)
Calcium: 9.4 mg/dL (ref 8.7–10.2)
Creatinine, Ser: 0.61 mg/dL (ref 0.57–1.00)
GFR calc non Af Amer: 100 mL/min/{1.73_m2} (ref >59)
GFR, EST AFRICAN AMERICAN: 116 mL/min/{1.73_m2} (ref >59)
Glucose: 77 mg/dL (ref 65–99)
Potassium: 4.2 mmol/L (ref 3.5–5.2)
Sodium: 146 mmol/L — ABNORMAL HIGH (ref 134–144)

## 2017-04-25 LAB — TSH: TSH: 1.21 u[IU]/mL (ref 0.450–4.500)

## 2017-05-09 DIAGNOSIS — Z01419 Encounter for gynecological examination (general) (routine) without abnormal findings: Secondary | ICD-10-CM | POA: Diagnosis not present

## 2017-05-09 DIAGNOSIS — Z6841 Body Mass Index (BMI) 40.0 and over, adult: Secondary | ICD-10-CM | POA: Diagnosis not present

## 2017-05-13 ENCOUNTER — Other Ambulatory Visit: Payer: Self-pay | Admitting: Obstetrics and Gynecology

## 2017-05-13 DIAGNOSIS — R928 Other abnormal and inconclusive findings on diagnostic imaging of breast: Secondary | ICD-10-CM

## 2017-05-20 ENCOUNTER — Ambulatory Visit: Payer: 59

## 2017-05-20 ENCOUNTER — Ambulatory Visit
Admission: RE | Admit: 2017-05-20 | Discharge: 2017-05-20 | Disposition: A | Discharge status: Home / Self Care | Payer: 59 | Source: Ambulatory Visit | Attending: Obstetrics and Gynecology | Admitting: Obstetrics and Gynecology

## 2017-05-20 DIAGNOSIS — R928 Other abnormal and inconclusive findings on diagnostic imaging of breast: Secondary | ICD-10-CM

## 2017-05-23 DIAGNOSIS — D509 Iron deficiency anemia, unspecified: Secondary | ICD-10-CM | POA: Diagnosis not present

## 2017-05-27 ENCOUNTER — Ambulatory Visit (INDEPENDENT_AMBULATORY_CARE_PROVIDER_SITE_OTHER): Payer: 59 | Admitting: Physician Assistant

## 2017-07-12 DIAGNOSIS — M545 Low back pain: Secondary | ICD-10-CM | POA: Diagnosis not present

## 2017-07-12 DIAGNOSIS — W19XXXA Unspecified fall, initial encounter: Secondary | ICD-10-CM | POA: Diagnosis not present

## 2017-07-12 DIAGNOSIS — S7001XA Contusion of right hip, initial encounter: Secondary | ICD-10-CM | POA: Diagnosis not present

## 2017-07-14 DIAGNOSIS — N3090 Cystitis, unspecified without hematuria: Secondary | ICD-10-CM | POA: Diagnosis not present

## 2017-07-14 DIAGNOSIS — N3000 Acute cystitis without hematuria: Secondary | ICD-10-CM | POA: Diagnosis not present

## 2017-07-16 NOTE — Progress Notes (Signed)
No show

## 2017-07-17 ENCOUNTER — Ambulatory Visit (INDEPENDENT_AMBULATORY_CARE_PROVIDER_SITE_OTHER): Payer: 59 | Admitting: Cardiovascular Disease

## 2017-07-17 DIAGNOSIS — I480 Paroxysmal atrial fibrillation: Secondary | ICD-10-CM

## 2017-07-17 DIAGNOSIS — R0989 Other specified symptoms and signs involving the circulatory and respiratory systems: Secondary | ICD-10-CM

## 2017-07-19 ENCOUNTER — Ambulatory Visit: Payer: 59 | Admitting: Cardiovascular Disease

## 2017-07-22 ENCOUNTER — Ambulatory Visit (INDEPENDENT_AMBULATORY_CARE_PROVIDER_SITE_OTHER): Payer: 59 | Admitting: Cardiovascular Disease

## 2017-07-22 ENCOUNTER — Encounter: Payer: Self-pay | Admitting: Cardiovascular Disease

## 2017-07-22 VITALS — BP 110/74 | HR 59 | Ht 66.0 in | Wt 276.6 lb

## 2017-07-22 DIAGNOSIS — I480 Paroxysmal atrial fibrillation: Secondary | ICD-10-CM | POA: Diagnosis not present

## 2017-07-22 DIAGNOSIS — I82402 Acute embolism and thrombosis of unspecified deep veins of left lower extremity: Secondary | ICD-10-CM | POA: Diagnosis not present

## 2017-07-22 DIAGNOSIS — I2601 Septic pulmonary embolism with acute cor pulmonale: Secondary | ICD-10-CM

## 2017-07-22 MED ORDER — METOPROLOL TARTRATE 25 MG PO TABS: 25.0000 mg | ORAL_TABLET | Freq: Every day | ORAL | 3 refills | Status: DC

## 2017-07-22 NOTE — Patient Instructions (Signed)

## 2017-07-22 NOTE — Addendum Note (Signed)
Addended by: Emmaline Life on: 07/22/2017 02:12 PM   Modules accepted: Orders

## 2017-07-22 NOTE — Progress Notes (Signed)
Roger Shelter Date of Birth:  1961/01/13 [place] [address] [place], [place]  [address] [phone]        Fax   [phone]  Problem list 1. Pulmonary embolus 2. Atrial fibrillation 3.  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  The patient has an interesting cardiopulmonary history.  She was in her usual state of health until late April 2015 when she began to have pulmonary congestion symptoms and thought that she had the flu.  She saw her PCP in [place] several times initially her oxygen saturation at the first visit was 96% and the second visit was 92%.  She continued to worsen and collapsed at home.  She was brought to [facility] where in the emergency room her d-dimer was 13 and she underwent a CT angiogram which showed massive pulmonary embolus with saddle embolus.  She had slight elevation of her troponins consistent with pulmonary embolus.  Her initial echocardiogram on 06/11/13 showed a moderate hypertension with a pulmonary artery pressure of 41.  The ventricular septum showed paradoxic motion consistent with right ventricular volume overload.  There was a probable medium sized ostium secundum ASD present.  The right atrium was markedly dilated.  The patient went on to be treated with transcatheter ultrasound assisted thrombolytic infusion therapy for her saddle embolus.  She had a good clinical response.  She had a followup echocardiogram on 06/22/13 which showed that her pulmonary artery pressure had returned to normal at 16.  The right atrial size was normal.  The right ventricular size was normal.  There was no evidence of ASD or PFO. The patient was evaluated for hypercoagulable state as the cause of her pulmonary embolus and her DVT of her right leg.  However the hypercoagulability workup was negative.  The patient does not have any family history of abnormal blood clotting. The patient herself has no prior cardiac  history.  She does have a past history of morbid obesity and has had lap band surgery by Dr. Johnathan Hausen.  The patient initially weighed about 388 pounds.  Since her surgery she has lost approximately 130 pounds. She still is experiencing mild exertional dyspnea.  She denies any chest discomfort.  She has not been aware of any racing of her heart.  May 06, 2015 Was recently found have atrial fibrillation. She's been having some increased shortness of breath and fatigue. She [facility] and was found have atrial fibrillation at rate of 154. Was treated with Diltiazem and metoprolol and she feels much better.   Thinks that she has converted .  July 10, 2016:  Dala is here for pre- op clearance for left knee replacement  Has not any noticeable episodes of Afib .  On Xarelto .  July 22, 2017:  Had an episode of PAF after taking cold meds.  Resolved after fluid, rest, and an extra metoprolol  Has mild swelling in feet  Is not getting exercise, sits most of the day   Wt. Is 276.     Current Outpatient Medications  Medication Sig Dispense Refill  . acetaminophen (TYLENOL) 325 MG tablet Take 650 mg by mouth every 6 (six) hours as needed for mild pain or moderate pain.    . IRON PO Take 1 tablet by mouth daily.    Marland Kitchen linaclotide (LINZESS) 145 MCG CAPS capsule Take 145 mcg by mouth daily as needed (for constipation).    Marland Kitchen  metoprolol tartrate (LOPRESSOR) 25 MG tablet Take 1 tablet (25 mg total) by mouth daily. 90 tablet 3  . Multiple Vitamin (MULTIVITAMIN) tablet Take 1 tablet by mouth daily.    Marland Kitchen omeprazole (PRILOSEC) 20 MG capsule Take 20 mg by mouth 2 (two) times daily before a meal.    . rivaroxaban (XARELTO) 20 MG TABS tablet Take 20 mg by mouth daily.      No current facility-administered medications for this visit.     Allergies  Allergen Reactions  . Contrast Media [Iodinated Diagnostic Agents] Shortness Of Breath and Other (See Comments)    APNEA    . Penicillins Itching and Rash    PATIENT HAD A PCN REACTION WITH IMMEDIATE RASH, FACIAL/TONGUE/THROAT SWELLING, SOB, OR LIGHTHEADEDNESS WITH HYPOTENSION:  #  #  #  YES  #  #  #   Has patient had a PCN reaction causing severe rash involving mucus membranes or skin necrosis: NO Has patient had a PCN reaction that required hospitalization NO Has patient had a PCN reaction occurring within the last 10 years: NO  . Ancef [Cefazolin Sodium] Nausea And Vomiting    Tolerates IV    Patient Active Problem List   Diagnosis Date Noted  . Presence of left artificial knee joint 08/14/2016  . Unilateral primary osteoarthritis, left knee 07/31/2016  . Status post total knee replacement, left 07/31/2016  . Abdominal hematoma   . Post-operative state   . Paroxysmal atrial fibrillation (HCC)   . Controlled type 2 diabetes mellitus with diabetic nephropathy ([place])   . Wound infection 01/25/2016  . Hydronephrosis, left 01/25/2016  . Hydronephrosis, right 01/25/2016  . DVT (deep venous thrombosis) ([place]) 01/25/2016  . Atrial fibrillation ([place]) 01/25/2016  . Diabetes mellitus with complication ([place])   . Wound infection after surgery, initial encounter   . Varicose veins of lower extremities with complications 13/09/6576  . Pain and swelling of left lower leg 10/27/2013  . Pain of left lower extremity 10/27/2013  . Gastroenteritis, acute 09/08/2013  . Fever, unspecified 09/04/2013  . ASD (atrial septal defect) 08/13/2013  . Pulmonary embolism ([place]) 06/22/2013  . Dyspnea 06/11/2013  . Anterior slip repaired Oct 2012 01/18/2011  . GI bleed 12/18/2010  . Diabetes mellitus ([place]) 12/18/2010  . Varicose veins of lower extremities with other complications 46/96/2952  . Diabetes mellitus   . HIP PAIN, RIGHT 03/06/2010    Social History   Tobacco Use  Smoking Status Never Smoker  Smokeless Tobacco Never Used    Social History   Substance and Sexual Activity  Alcohol Use No  . Alcohol/week: 0.0 oz     Family History  Problem Relation Age of Onset  . Heart disease Father   . Kidney disease Father   . Peripheral vascular disease Father   . Heart attack Father        53  . Diabetes Mother   . Arthritis Mother     Review of systems was reviewed and current history.  Other systems are negative.Marland Kitchen   Physical Exam: Blood pressure 110/74, pulse (!) 59, height 5\' 6"  (1.676 m), weight 276 lb 9.6 oz (125.5 kg).  GEN:  Morbidly obese female.   HEENT: Normal NECK: No JVD; No carotid bruits LYMPHATICS: No lymphadenopathy CARDIAC: RRR , no murmurs, rubs, gallops RESPIRATORY:  Clear to auscultation without rales, wheezing or rhonchi  ABDOMEN: Soft, non-tender, non-distended MUSCULOSKELETAL:  Trace edema in legs  SKIN: Warm and dry NEUROLOGIC:  Alert and oriented x 3  EKG :     Assessment / Plan: 1. Paroxysmal atrial fib:   Meital continues to have intermittent episodes of paroxysmal atrial fibrillation.  These typically occur when she takes cold medicines.  These typically resolve if she rests drinks fluids and takes an extra metoprolol tablet. Discussed possibly using propranolol for this purpose but she is fine taking the extra metoprolol. I will see her again in 1 year.   2. history of massive pulmonary embolus with saddle embolus in May 2015 -  Continue Xarelto. 3. history of DVT of right lower extremity preceding her pulmonary embolus   5. morbid obesity We discussed once again the importance of diet, exercise, weight loss.   Mertie Moores, MD  07/22/2017 1:57 PM    [facility],  [place], [place]  [address] Pager [phone] Phone: [phone]; Fax: [phone]

## 2017-07-26 DIAGNOSIS — R109 Unspecified abdominal pain: Secondary | ICD-10-CM | POA: Diagnosis not present

## 2017-07-29 ENCOUNTER — Ambulatory Visit (INDEPENDENT_AMBULATORY_CARE_PROVIDER_SITE_OTHER): Payer: Self-pay

## 2017-07-29 ENCOUNTER — Encounter (INDEPENDENT_AMBULATORY_CARE_PROVIDER_SITE_OTHER): Payer: Self-pay | Admitting: Physician Assistant

## 2017-07-29 ENCOUNTER — Ambulatory Visit (INDEPENDENT_AMBULATORY_CARE_PROVIDER_SITE_OTHER): Payer: 59 | Admitting: Physician Assistant

## 2017-07-29 ENCOUNTER — Ambulatory Visit (INDEPENDENT_AMBULATORY_CARE_PROVIDER_SITE_OTHER): Payer: 59

## 2017-07-29 VITALS — Ht 66.0 in | Wt 276.0 lb

## 2017-07-29 DIAGNOSIS — M25561 Pain in right knee: Secondary | ICD-10-CM | POA: Diagnosis not present

## 2017-07-29 DIAGNOSIS — Z96652 Presence of left artificial knee joint: Secondary | ICD-10-CM

## 2017-07-29 MED ORDER — METHYLPREDNISOLONE ACETATE 40 MG/ML IJ SUSP
40.0000 mg | INTRAMUSCULAR | Status: AC | PRN
  Administered 2017-07-29: 40 mg via INTRA_ARTICULAR

## 2017-07-29 MED ORDER — LIDOCAINE HCL 1 % IJ SOLN
3.0000 mL | INTRAMUSCULAR | Status: AC | PRN
  Administered 2017-07-29: 3 mL

## 2017-07-29 NOTE — Progress Notes (Signed)
Office Visit Note   Patient: [name]           Date of Birth: 09-25-1960           MRN: [id number] Visit Date: 07/29/2017              Requested by: Mateo Flow, MD [place], [place] [address] PCP: Mateo Flow, MD   Assessment & Plan: Visit Diagnoses:  1. Status post total left knee replacement   2. Acute pain of right knee     Plan: She will work on quad strengthening right knee.  She will follow-up with [place] in 2 weeks to check her progress lack of.  Questions were encouraged and answered at length.  Follow-Up Instructions: Return in about 2 weeks (around 08/12/2017).   Orders:  Orders Placed This Encounter  Procedures  . Large Joint Inj  . XR Knee 1-2 Views Left  . XR Knee 1-2 Views Right   No orders of the defined types were placed in this encounter.     Procedures: Large Joint Inj: R knee on 07/29/2017 6:12 PM Indications: pain Details: 22 G 1.5 in needle, anterolateral approach  Arthrogram: No  Medications: 3 mL lidocaine 1 %; 40 mg methylPREDNISolone acetate 40 MG/ML Outcome: tolerated well, no immediate complications Procedure, treatment alternatives, risks and benefits explained, specific risks discussed. Consent was given by the patient. Immediately prior to procedure a time out was called to verify the correct patient, procedure, equipment, support staff and site/side marked as required. Patient was prepped and draped in the usual sterile fashion.       Clinical Data: No additional findings.   Subjective: Chief Complaint  Patient presents with  . Left Knee - Follow-up    07/31/16 left total knee replacement   . Right Knee - Pain    S/p fall 2 weeks ago    HPI Mrs. Calo's 1 year status post left total knee arthroplasty.  Left knee is doing well.  She has no complaints in regards to the left knee.  Right knee pain is been ongoing since she fell 2 weeks ago in her garden.  She has had no locking catching painful popping  or giving way.  She has had area of swelling lateral aspect of the knee.  She denies any syncopal episode,  dizziness, chest pain at the time of the fall.  She fell and hit her right knee on a garden bed. Review of Systems Please see HPI otherwise negative  Objective: Vital Signs: Ht 5\' 6"  (1.676 m)   Wt 276 lb (125.2 kg)   BMI 44.55 kg/m   Physical Exam  Constitutional: She is oriented to person, place, and time. She appears well-developed and well-nourished. No distress.  Pulmonary/Chest: Effort normal.  Neurological: She is alert and oriented to person, place, and time.  Skin: She is not diaphoretic.  Psychiatric: She has a normal mood and affect.    Ortho Exam Left knee good range of motion without pain.  No instability valgus varus stressing.  Surgical incisions well-healed no signs of infection.  Bilateral calf supple nontender.  Right knee she has tenderness over the lateral aspect of the knee good range of motion.  No instability valgus varus stressing. Specialty Comments:  No specialty comments available.  Imaging: Xr Knee 1-2 Views Left  Result Date: 07/29/2017 Left knee AP lateral views: Well-seated left total knee arthroplasty without any complicating features.  Xr Knee 1-2 Views Right  Result Date: 07/29/2017 Right knee 2 views: No acute fracture.  Knee is well located.  Moderate patellofemoral changes.  Mild lateral compartmental changes.    PMFS History: Patient Active Problem List   Diagnosis Date Noted  . Presence of left artificial knee joint 08/14/2016  . Unilateral primary osteoarthritis, left knee 07/31/2016  . Status post total knee replacement, left 07/31/2016  . Abdominal hematoma   . Post-operative state   . Paroxysmal atrial fibrillation (HCC)   . Controlled type 2 diabetes mellitus with diabetic nephropathy ([place])   . Wound infection 01/25/2016  . Hydronephrosis, left 01/25/2016  . Hydronephrosis, right 01/25/2016  . DVT (deep venous  thrombosis) ([place]) 01/25/2016  . Atrial fibrillation ([place]) 01/25/2016  . Diabetes mellitus with complication ([place])   . Wound infection after surgery, initial encounter   . Varicose veins of lower extremities with complications 64/33/2951  . Pain and swelling of left lower leg 10/27/2013  . Pain of left lower extremity 10/27/2013  . Gastroenteritis, acute 09/08/2013  . Fever, unspecified 09/04/2013  . ASD (atrial septal defect) 08/13/2013  . Pulmonary embolism ([place]) 06/22/2013  . Dyspnea 06/11/2013  . Anterior slip repaired Oct 2012 01/18/2011  . GI bleed 12/18/2010  . Diabetes mellitus ([place]) 12/18/2010  . Varicose veins of lower extremities with other complications 88/41/6606  . Diabetes mellitus   . HIP PAIN, RIGHT 03/06/2010   Past Medical History:  Diagnosis Date  . Anemia   . Arthritis   . Atrial fibrillation ([place])   . Bleeding ulcer 12-2010  hospitalized for 1 week  . Blood transfusion   . Blood transfusion without reported diagnosis    2011-2 units transfused, bleeding ulcer  . Bruises easily   . CHF (congestive heart failure) ([place])    with PE resolved after   . Constipation   . Difficulty in swallowing 03-24-13   no longer a problem  . DVT (deep venous thrombosis) ([place]) 06-2013   right leg  . Dysrhythmia     Hx of A fib   . GERD (gastroesophageal reflux disease)   . Hyperlipidemia   . Leg swelling 03-24-13   not a problem any longer  . Peripheral vascular disease ([place])   . Pulmonary embolus ([place]) 06-2013  . Reflux   . Shortness of breath    with PE  . Vomiting 03-24-13   problem resolved after lap band revision    Family History  Problem Relation Age of Onset  . Heart disease Father   . Kidney disease Father   . Peripheral vascular disease Father   . Heart attack Father        41  . Diabetes Mother   . Arthritis Mother     Past Surgical History:  Procedure Laterality Date  . APPENDECTOMY    . CARPEL TUNNEL BILATEREAL    . CHOLECYSTECTOMY     5'12    . COLONOSCOPY N/A 04/10/2013   Procedure: COLONOSCOPY;  Surgeon: Beryle Beams, MD;  Location: [facility];  Service: Endoscopy;  Laterality: N/A;  . DIAGNOSTIC LAPAROSCOPY    . ENDOVENOUS ABLATION SAPHENOUS VEIN W/ LASER  06-2008  . ENDOVENOUS ABLATION SAPHENOUS VEIN W/ LASER Left 12-09-2013   EVLA LEFT GREATER SAPHENOUS VEIN BY TODD EARLY MD  . ESOPHAGOGASTRODUODENOSCOPY  12/18/2010   Procedure: ESOPHAGOGASTRODUODENOSCOPY (EGD);  Surgeon: Gatha Mayer, MD;  Location: [facility];  Service: Endoscopy;  Laterality: N/A;  . JOINT REPLACEMENT    . LAPAROSCOPIC GASTRIC BANDING  01-05-2008  . LAPAROSCOPIC GASTRIC  BANDING  12/18/2010   01/04/2009  . SALPHINGOOOPHORECTOMY Right    cyst removal  . TONSILLECTOMY    . TOTAL KNEE ARTHROPLASTY Left 07/31/2016   Procedure: LEFT TOTAL KNEE ARTHROPLASTY;  Surgeon: [name], MD;  Location: [facility];  Service: Orthopedics;  Laterality: Left;  . WEDGE RESECTION OF OVARY  LEFT OVARY    Social History   Occupational History  . Occupation: Programmer, multimedia: [employer]  Tobacco Use  . Smoking status: Never Smoker  . Smokeless tobacco: Never Used  Substance and Sexual Activity  . Alcohol use: No    Alcohol/week: 0.0 oz  . Drug use: No  . Sexual activity: Yes    Partners: Male    Birth control/protection: None    Comment: declined condoms

## 2017-08-29 DIAGNOSIS — H40023 Open angle with borderline findings, high risk, bilateral: Secondary | ICD-10-CM | POA: Diagnosis not present

## 2017-10-17 DIAGNOSIS — L570 Actinic keratosis: Secondary | ICD-10-CM | POA: Diagnosis not present

## 2017-10-17 DIAGNOSIS — L814 Other melanin hyperpigmentation: Secondary | ICD-10-CM | POA: Diagnosis not present

## 2017-10-17 DIAGNOSIS — C44329 Squamous cell carcinoma of skin of other parts of face: Secondary | ICD-10-CM | POA: Diagnosis not present

## 2017-10-25 IMAGING — CT CT ANGIO CHEST
2 of 7 series · 19 of 46 positions shown · IV contrast (isovue)
Comparison: Chest x-ray dated August 30, 2016; CT chest dated June 14, 2013.

CLINICAL DATA: Shortness of breath for 3 days.

EXAM:
CT ANGIOGRAPHY CHEST WITH CONTRAST
TECHNIQUE: Multidetector CT imaging of the chest was performed using the
standard protocol during bolus administration of intravenous
contrast. Multiplanar CT image reconstructions and MIPs were
obtained to evaluate the vascular anatomy.
CONTRAST:  100 cc Isovue 370 intravenous contrast.

[Series 8: thins · axial · 0.75mm/px · z∈[+1230,+1446]mm · 16 of 349 slices shown]
[im 20/349  lung]
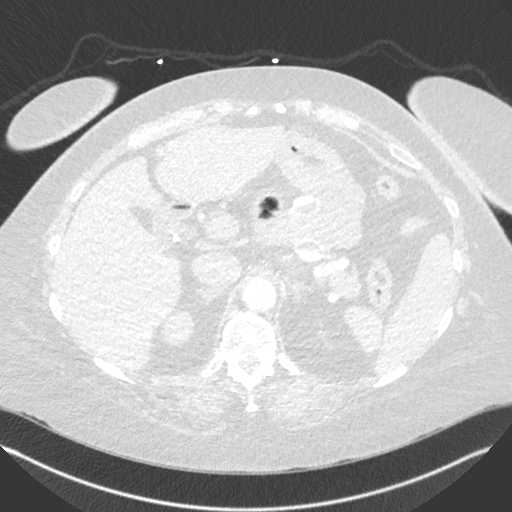
[im 39/349  soft-tissue]
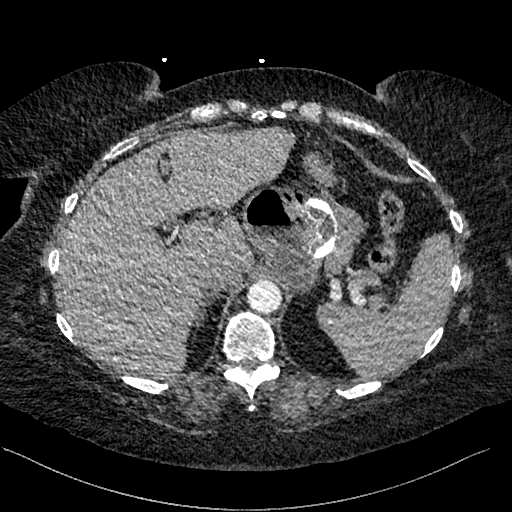
[im 59/349  lung]
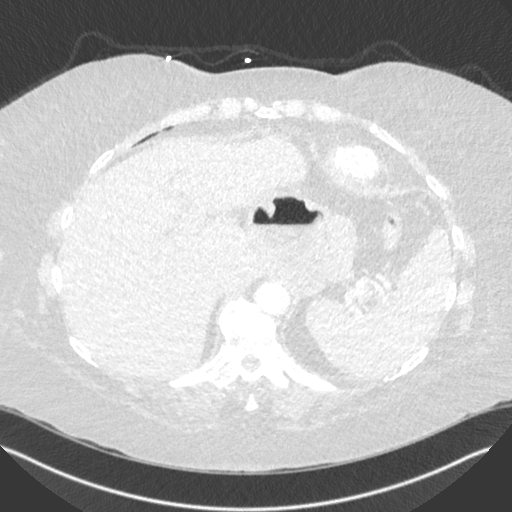
[im 78/349  soft-tissue]
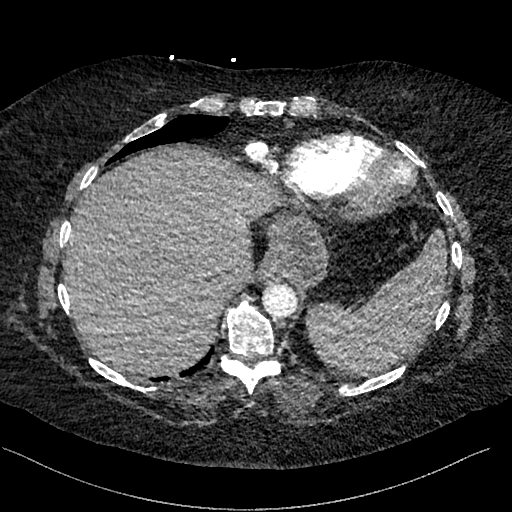
[im 97/349  lung]
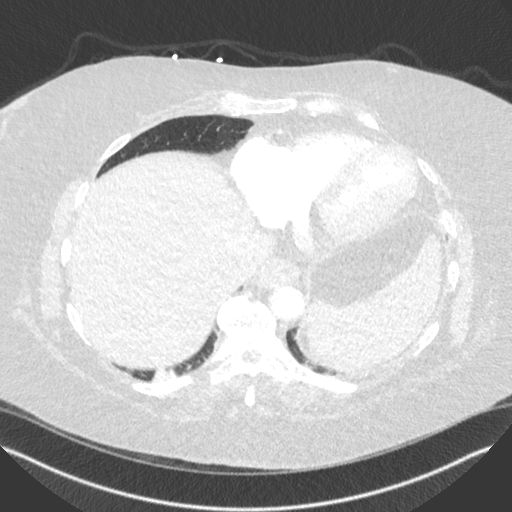
[im 117/349  soft-tissue]
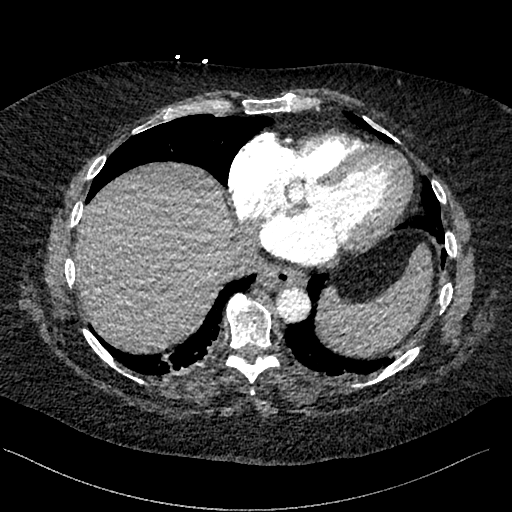
[im 136/349  lung]
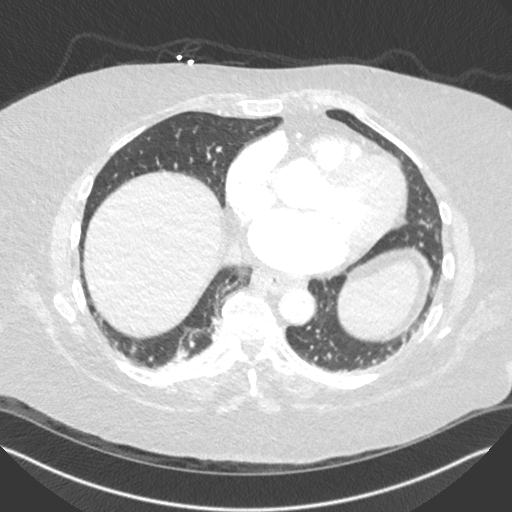
[im 155/349  soft-tissue]
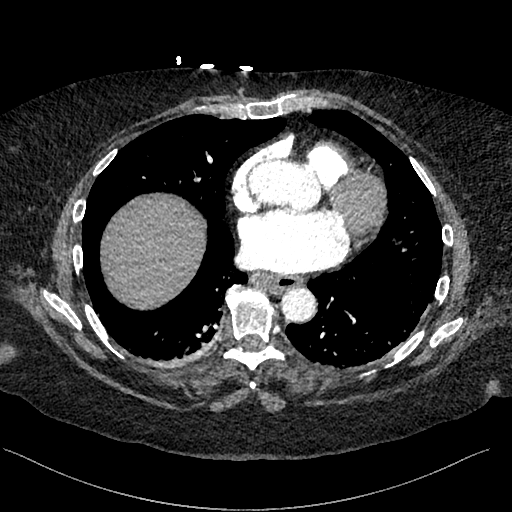
[im 194/349  lung]
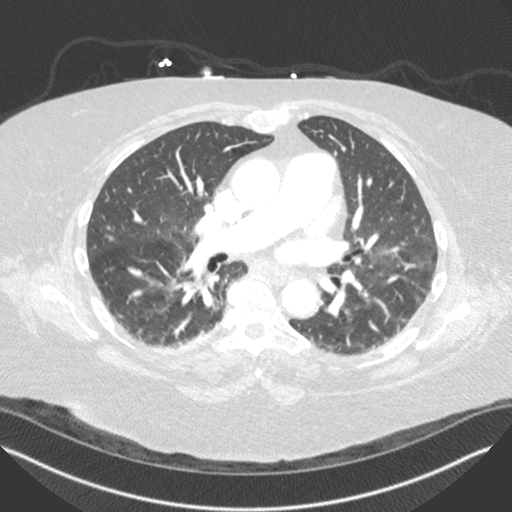
[im 213/349  soft-tissue]
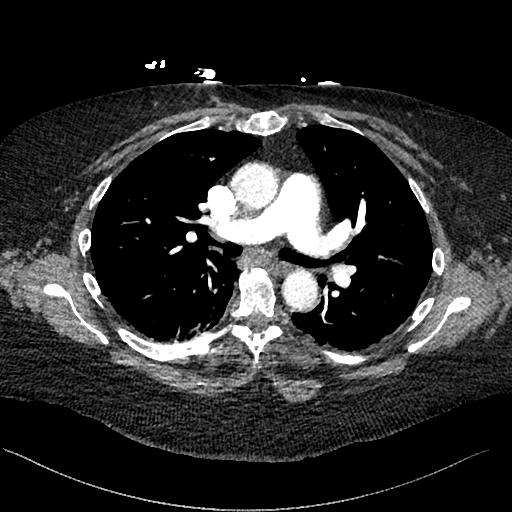
[im 233/349  lung]
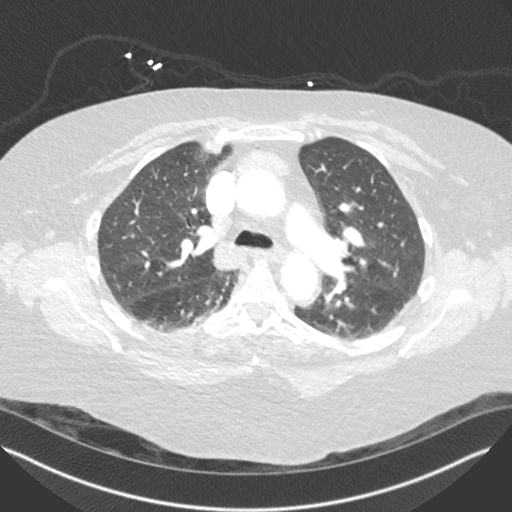
[im 252/349  soft-tissue]
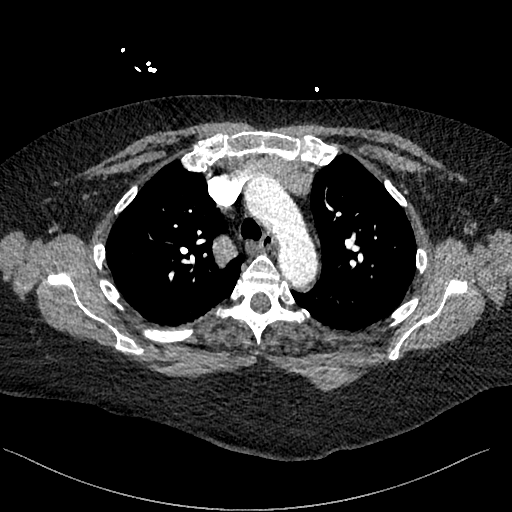
[im 271/349  lung]
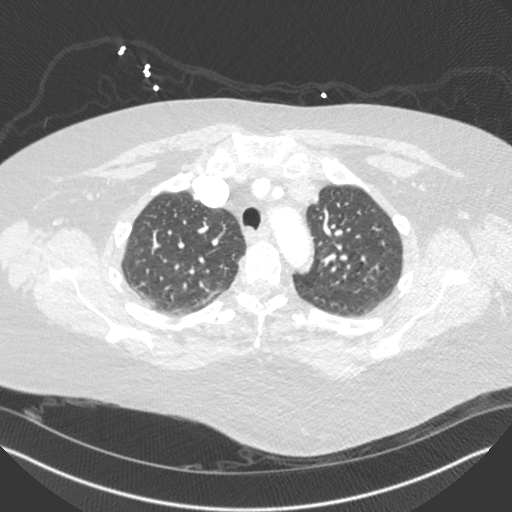
[im 291/349  soft-tissue]
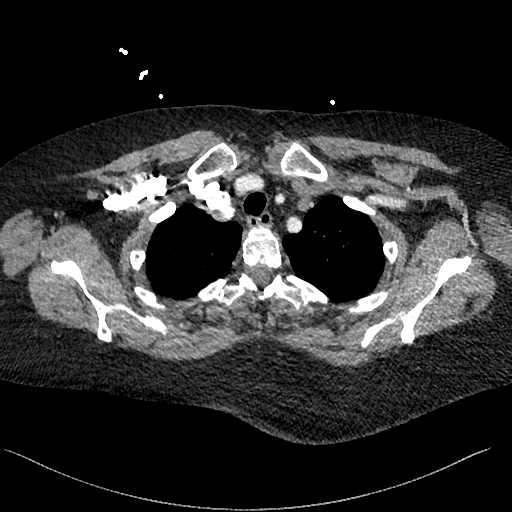
[im 310/349  lung]
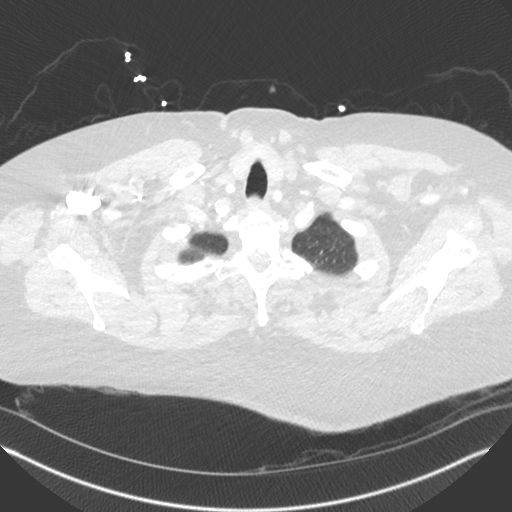
[im 329/349  soft-tissue]
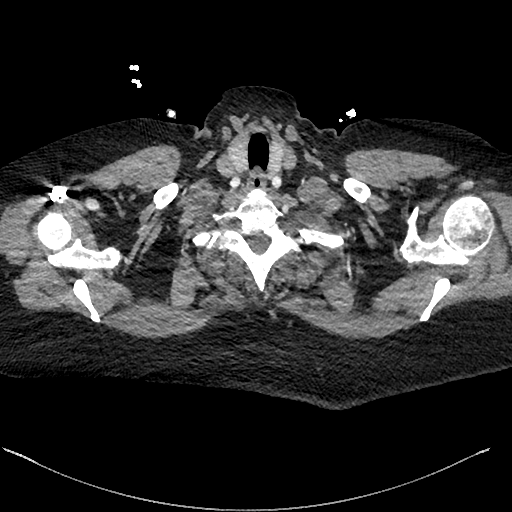

[Series 9: cor · coronal · 0.59mm/px · 3 of 151 slices shown]
[im 38/151  soft-tissue]
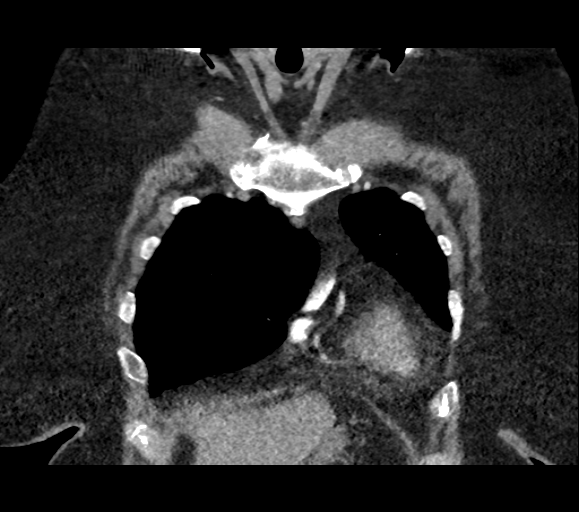
[im 76/151  soft-tissue]
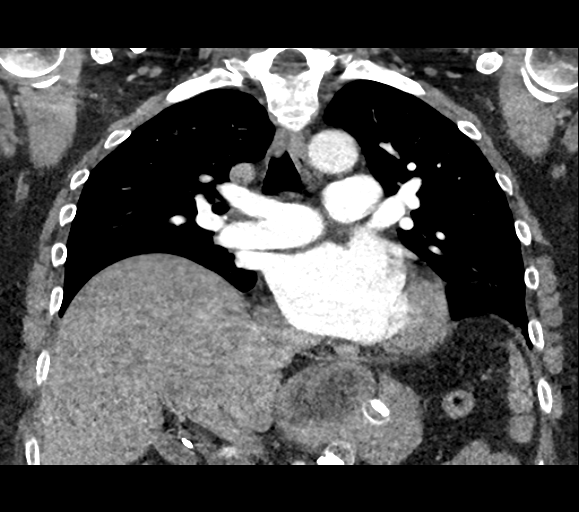
[im 113/151  soft-tissue]
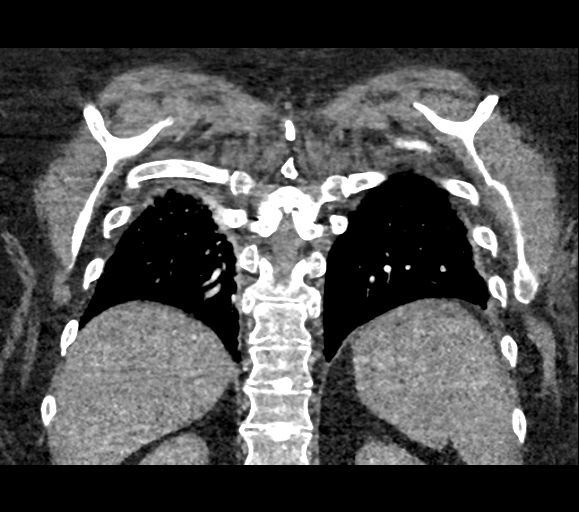

[19 of 46 positions shown; findings below may reference images not displayed]

FINDINGS: Cardiovascular: Satisfactory opacification of the pulmonary arteries
to the segmental level. No evidence of pulmonary embolism. Normal
heart size. No pericardial effusion. No evidence of right heart
strain.

Mediastinum/Nodes: No enlarged mediastinal, hilar, or axillary lymph
nodes. Thyroid gland, trachea, and esophagus demonstrate no
significant findings.

Lungs/Pleura: Bibasilar atelectasis. Lungs are otherwise clear. No
pleural effusion or pneumothorax.

Upper Abdomen: No acute abnormality. Cholecystectomy. Status post
gastric lap band surgery.

Musculoskeletal: No chest wall abnormality. No acute or significant
osseous findings. Stable degenerative changes of the thoracic spine.

Review of the MIP images confirms the above findings.
IMPRESSION: 1. No evidence of pulmonary embolism.
2. No acute intrathoracic process.

## 2017-10-27 DIAGNOSIS — R05 Cough: Secondary | ICD-10-CM | POA: Diagnosis not present

## 2017-10-27 DIAGNOSIS — J069 Acute upper respiratory infection, unspecified: Secondary | ICD-10-CM | POA: Diagnosis not present

## 2017-10-31 DIAGNOSIS — H40023 Open angle with borderline findings, high risk, bilateral: Secondary | ICD-10-CM | POA: Diagnosis not present

## 2017-10-31 DIAGNOSIS — Z79899 Other long term (current) drug therapy: Secondary | ICD-10-CM | POA: Diagnosis not present

## 2017-10-31 LAB — HM DIABETES EYE EXAM

## 2017-11-07 DIAGNOSIS — R87610 Atypical squamous cells of undetermined significance on cytologic smear of cervix (ASC-US): Secondary | ICD-10-CM | POA: Diagnosis not present

## 2017-11-07 DIAGNOSIS — N870 Mild cervical dysplasia: Secondary | ICD-10-CM | POA: Diagnosis not present

## 2017-12-27 DIAGNOSIS — M9902 Segmental and somatic dysfunction of thoracic region: Secondary | ICD-10-CM | POA: Diagnosis not present

## 2017-12-27 DIAGNOSIS — M542 Cervicalgia: Secondary | ICD-10-CM | POA: Diagnosis not present

## 2017-12-27 DIAGNOSIS — M9901 Segmental and somatic dysfunction of cervical region: Secondary | ICD-10-CM | POA: Diagnosis not present

## 2017-12-30 DIAGNOSIS — M542 Cervicalgia: Secondary | ICD-10-CM | POA: Diagnosis not present

## 2017-12-30 DIAGNOSIS — M9902 Segmental and somatic dysfunction of thoracic region: Secondary | ICD-10-CM | POA: Diagnosis not present

## 2017-12-30 DIAGNOSIS — M9901 Segmental and somatic dysfunction of cervical region: Secondary | ICD-10-CM | POA: Diagnosis not present

## 2018-01-06 DIAGNOSIS — M542 Cervicalgia: Secondary | ICD-10-CM | POA: Diagnosis not present

## 2018-01-06 DIAGNOSIS — M9901 Segmental and somatic dysfunction of cervical region: Secondary | ICD-10-CM | POA: Diagnosis not present

## 2018-01-06 DIAGNOSIS — M9902 Segmental and somatic dysfunction of thoracic region: Secondary | ICD-10-CM | POA: Diagnosis not present

## 2018-01-10 DIAGNOSIS — M9902 Segmental and somatic dysfunction of thoracic region: Secondary | ICD-10-CM | POA: Diagnosis not present

## 2018-01-10 DIAGNOSIS — M9901 Segmental and somatic dysfunction of cervical region: Secondary | ICD-10-CM | POA: Diagnosis not present

## 2018-01-10 DIAGNOSIS — M542 Cervicalgia: Secondary | ICD-10-CM | POA: Diagnosis not present

## 2018-02-03 DIAGNOSIS — N870 Mild cervical dysplasia: Secondary | ICD-10-CM | POA: Diagnosis not present

## 2018-03-13 DIAGNOSIS — Z23 Encounter for immunization: Secondary | ICD-10-CM | POA: Diagnosis not present

## 2018-03-13 DIAGNOSIS — Z6841 Body Mass Index (BMI) 40.0 and over, adult: Secondary | ICD-10-CM | POA: Diagnosis not present

## 2018-03-13 DIAGNOSIS — D509 Iron deficiency anemia, unspecified: Secondary | ICD-10-CM | POA: Diagnosis not present

## 2018-03-13 DIAGNOSIS — K219 Gastro-esophageal reflux disease without esophagitis: Secondary | ICD-10-CM | POA: Diagnosis not present

## 2018-03-26 DIAGNOSIS — J329 Chronic sinusitis, unspecified: Secondary | ICD-10-CM | POA: Diagnosis not present

## 2018-03-26 DIAGNOSIS — Z6841 Body Mass Index (BMI) 40.0 and over, adult: Secondary | ICD-10-CM | POA: Diagnosis not present

## 2018-03-26 DIAGNOSIS — J40 Bronchitis, not specified as acute or chronic: Secondary | ICD-10-CM | POA: Diagnosis not present

## 2018-04-03 DIAGNOSIS — Z4651 Encounter for fitting and adjustment of gastric lap band: Secondary | ICD-10-CM | POA: Diagnosis not present

## 2018-04-03 DIAGNOSIS — K210 Gastro-esophageal reflux disease with esophagitis: Secondary | ICD-10-CM | POA: Diagnosis not present

## 2018-04-07 ENCOUNTER — Other Ambulatory Visit: Payer: Self-pay | Admitting: Student

## 2018-04-07 DIAGNOSIS — R109 Unspecified abdominal pain: Secondary | ICD-10-CM

## 2018-04-10 DIAGNOSIS — K210 Gastro-esophageal reflux disease with esophagitis: Secondary | ICD-10-CM | POA: Diagnosis not present

## 2018-04-10 DIAGNOSIS — Z4651 Encounter for fitting and adjustment of gastric lap band: Secondary | ICD-10-CM | POA: Diagnosis not present

## 2018-06-13 DIAGNOSIS — R062 Wheezing: Secondary | ICD-10-CM | POA: Diagnosis not present

## 2018-06-13 DIAGNOSIS — Z6841 Body Mass Index (BMI) 40.0 and over, adult: Secondary | ICD-10-CM | POA: Diagnosis not present

## 2018-06-26 ENCOUNTER — Telehealth: Payer: Self-pay | Admitting: Nurse Practitioner

## 2018-06-26 DIAGNOSIS — Z01419 Encounter for gynecological examination (general) (routine) without abnormal findings: Secondary | ICD-10-CM | POA: Diagnosis not present

## 2018-06-26 DIAGNOSIS — Z6841 Body Mass Index (BMI) 40.0 and over, adult: Secondary | ICD-10-CM | POA: Diagnosis not present

## 2018-06-26 NOTE — Telephone Encounter (Signed)
Left message for patient to call back regarding her appointment that is scheduled with Dr. Acie Fredrickson for next week

## 2018-06-27 NOTE — Telephone Encounter (Signed)
Follow up  ° ° °Pt is returning call  ° ° °Please call back  °

## 2018-06-27 NOTE — Telephone Encounter (Signed)
Attempted to reach patient to discuss appointment but was unsuccessful.

## 2018-07-01 ENCOUNTER — Telehealth: Payer: Self-pay | Admitting: Cardiovascular Disease

## 2018-07-01 NOTE — Telephone Encounter (Signed)
Attempted to contact pt about upcoming appt. Left message for pt to call back with preference for visit.

## 2018-07-01 NOTE — Telephone Encounter (Signed)
Left message for pt to call back about upcoming appt.

## 2018-07-01 NOTE — Telephone Encounter (Signed)
New Message    Patient returning your call about appt preferences please give her a call back.

## 2018-07-03 ENCOUNTER — Ambulatory Visit: Payer: 59 | Admitting: Cardiovascular Disease

## 2018-07-11 ENCOUNTER — Telehealth: Payer: Self-pay | Admitting: Cardiovascular Disease

## 2018-07-11 NOTE — Telephone Encounter (Signed)
Pt c/o swelling: STAT is pt has developed SOB within 24 hours  1) How much weight have you gained and in what time span? 20 pounds in a month  2) If swelling, where is the swelling located?   Legs and feet  3) Are you currently taking a fluid pill? Not yet. Was prescribed lasix by her PCP yesterday  4) Are you currently SOB? yes  5) Do you have a log of your daily weights (if so, list)?   6) Have you gained 3 pounds in a day or 5 pounds in a week?   7) Have you traveled recently? No  Patient has swelling concerns. She went to her PCP yesterday and was prescribed lasix. She has not started taking it yet.  She also reports having SOB. She can not seem to breathe laying down on the bed. She has been sleeping in a recliner chair.   Her PCP recommended she see Dr. Acie Ford without delay.

## 2018-07-11 NOTE — Telephone Encounter (Signed)
Left detailed message of Dr Elmarie Shiley recommendations .Adonis Housekeeper

## 2018-07-11 NOTE — Telephone Encounter (Signed)
Agree with starting lasix and see how she does.

## 2018-07-11 NOTE — Telephone Encounter (Signed)
Spoke with pt and notes significant edema 2+ in left leg and 1+ right leg significant SOB at rest as well as with mild exertion.Per pt seen PMD yesterday and was started on Furosemide 20 mg daily has not started as of yet will get today Per pt notes slight cough but no fever pt was awakened this am with run of a fib that lasted for about 45 min did take second Metoprolol with relief Pt has hx of PE and PMD is worried may have returned pt is on Xarelto and pt states felt different when had the PE Encouraged pt to start Furosemide today and wishes for appt Appt made(virtual visit )with Dr Acie Fredrickson for 07/17/18 at 8:00 am the patient aware if no improvement after starting Furosemide or symptoms worsen to ge to ED for eval and tx Verbalizes understanding ./cy

## 2018-07-17 ENCOUNTER — Ambulatory Visit (HOSPITAL_COMMUNITY)
Admission: RE | Admit: 2018-07-17 | Discharge: 2018-07-17 | Disposition: A | Discharge status: Home / Self Care | Payer: 59 | Source: Ambulatory Visit | Attending: Cardiology | Admitting: Cardiology

## 2018-07-17 ENCOUNTER — Encounter: Payer: Self-pay | Admitting: Cardiovascular Disease

## 2018-07-17 ENCOUNTER — Telehealth (INDEPENDENT_AMBULATORY_CARE_PROVIDER_SITE_OTHER): Payer: 59 | Admitting: Cardiovascular Disease

## 2018-07-17 ENCOUNTER — Other Ambulatory Visit: Payer: Self-pay

## 2018-07-17 VITALS — Ht 66.0 in

## 2018-07-17 DIAGNOSIS — M7989 Other specified soft tissue disorders: Secondary | ICD-10-CM

## 2018-07-17 DIAGNOSIS — I480 Paroxysmal atrial fibrillation: Secondary | ICD-10-CM

## 2018-07-17 DIAGNOSIS — R0609 Other forms of dyspnea: Secondary | ICD-10-CM

## 2018-07-17 DIAGNOSIS — R0600 Dyspnea, unspecified: Secondary | ICD-10-CM

## 2018-07-17 DIAGNOSIS — Z7189 Other specified counseling: Secondary | ICD-10-CM

## 2018-07-17 MED ORDER — POTASSIUM CHLORIDE ER 10 MEQ PO TBCR: 10.0000 meq | EXTENDED_RELEASE_TABLET | Freq: Every day | ORAL | 3 refills | Status: DC

## 2018-07-17 MED ORDER — TORSEMIDE 20 MG PO TABS: 20.0000 mg | ORAL_TABLET | Freq: Every day | ORAL | 3 refills | Status: DC

## 2018-07-17 MED ORDER — METOPROLOL SUCCINATE ER 25 MG PO TB24: 25.0000 mg | ORAL_TABLET | Freq: Every day | ORAL | 3 refills | Status: DC

## 2018-07-17 NOTE — Patient Instructions (Addendum)
Medication Instructions:  Your physician has recommended you make the following change in your medication:  STOP Furosemide START Torsemide (Demadex) 20 mg once daily - call if you do not have significant urine output and we will increase your dose to twice daily START Kdur (Potassium chloride) 10 mEq once daily   If you need a refill on your cardiac medications before your next appointment, please call your pharmacy.    Lab work: Your physician recommends that you return for lab work on Monday June 15 for TSH, CBC, BMET  If you have labs (blood work) drawn today and your tests are completely normal, you will receive your results only by: Marland Kitchen MyChart Message (if you have MyChart) OR . A paper copy in the mail If you have any lab test that is abnormal or we need to change your treatment, we will call you to review the results.   Testing/Procedures: Your physician has requested that you have an echocardiogram. Echocardiography is a painless test that uses sound waves to create images of your heart. It provides your doctor with information about the size and shape of your heart and how well your heart's chambers and valves are working. This procedure takes approximately one hour. There are no restrictions for this procedure.  Your physician has requested that you have a lower extremity venous duplex. This test is an ultrasound of the veins in the legs or arms. It looks at venous blood flow that carries blood from the heart to the legs or arms. Allow one hour for a Lower Venous exam. Allow thirty minutes for an Upper Venous exam. There are no restrictions or special instructions.  Your physician has recommended that you wear an event monitor. Event monitors are medical devices that record the heart's electrical activity. Doctors most often [place] these monitors to diagnose arrhythmias. Arrhythmias are problems with the speed or rhythm of the heartbeat. The monitor is a small, portable device. You can  wear one while you do your normal daily activities. This is usually used to diagnose what is causing palpitations/syncope (passing out).    Follow-Up: Your physician recommends that you come into the office for a follow-up appointment on July 14 with Dr. Acie Fredrickson at 9:20 am

## 2018-07-17 NOTE — Progress Notes (Signed)
Virtual Visit via Video Note   This visit type was conducted due to national recommendations for restrictions regarding the COVID-19 Pandemic (e.g. social distancing) in an effort to limit this patient's exposure and mitigate transmission in our community.  Due to her co-morbid illnesses, this patient is at least at moderate risk for complications without adequate follow up.  This format is felt to be most appropriate for this patient at this time.  All issues noted in this document were discussed and addressed.  A limited physical exam was performed with this format.  Please refer to the patient's chart for her consent to telehealth for [facility].   Date:  07/18/2018   ID:  [name], DOB 1960/05/14, MRN [id number]  Patient Location: Home Provider Location: Home  PCP:  Mateo Flow, MD  Cardiologist:  Mertie Moores, MD  Electrophysiologist:  None   Problem List Problem list 1. Pulmonary embolus 2. Atrial fibrillation 3.  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  The patient has an interesting cardiopulmonary history.  She was in her usual state of health until late April 2015 when she began to have pulmonary congestion symptoms and thought that she had the flu.  She saw her PCP in [place] several times initially her oxygen saturation at the first visit was 96% and the second visit was 92%.  She continued to worsen and collapsed at home.  She was brought to [facility] where in the emergency room her d-dimer was 13 and she underwent a CT angiogram which showed massive pulmonary embolus with saddle embolus.  She had slight elevation of her troponins consistent with pulmonary embolus.  Her initial echocardiogram on 06/11/13 showed a moderate hypertension with a pulmonary artery pressure of 41.  The ventricular septum showed paradoxic motion consistent with right ventricular volume overload.  There was a probable medium sized ostium secundum ASD present.   The right atrium was markedly dilated.  The patient went on to be treated with transcatheter ultrasound assisted thrombolytic infusion therapy for her saddle embolus.  She had a good clinical response.  She had a followup echocardiogram on 06/22/13 which showed that her pulmonary artery pressure had returned to normal at 16.  The right atrial size was normal.  The right ventricular size was normal.  There was no evidence of ASD or PFO. The patient was evaluated for hypercoagulable state as the cause of her pulmonary embolus and her DVT of her right leg.  However the hypercoagulability workup was negative.  The patient does not have any family history of abnormal blood clotting. The patient herself has no prior cardiac history.  She does have a past history of morbid obesity and has had lap band surgery by Dr. Johnathan Hausen.  The patient initially weighed about 388 pounds.  Since her surgery she has lost approximately 130 pounds. She still is experiencing mild exertional dyspnea.  She denies any chest discomfort.  She has not been aware of any racing of her heart.  May 06, 2015 Was recently found have atrial fibrillation. She's been having some increased shortness of breath and fatigue. She came to the [facility] emergency room and was found have atrial fibrillation at rate of 154. Was treated with Diltiazem and metoprolol and she feels much better.   Thinks that she has converted .  July 10, 2016:  [name] is here for pre- op clearance for left knee replacement  Has not any noticeable episodes of Afib .  On Xarelto .  July 22, 2017:  Had an episode of PAF after taking cold meds.  Resolved after fluid, rest, and an extra metoprolol  Has mild swelling in feet  Is not getting exercise, sits most of the day  Wt. Is 276.    Evaluation Performed:  Follow-Up Visit  Chief Complaint:  Follow up PAF and hx of pulmonary embolus  July 17, 2018   [name] is a 58 y.o. female with a  history of paroxysmal atrial fibrillation and a history of pulmonary embolus.  Several weeks ago woke up with AF,  Dyspnea, Lasted for 45 min Took 2nd metoprolol at that time Developed leg swelling and shortness of breath Was started on lasix  Leg edema has not improved despite lasix  ( she showed me her legs on camara )  Left >> right leg swelling   The patient does not have symptoms concerning for COVID-19 infection (fever, chills, cough, or new shortness of breath).    Past Medical History:  Diagnosis Date   Anemia    Arthritis    Atrial fibrillation ([place])    Bleeding ulcer 12-2010  hospitalized for 1 week   Blood transfusion    Blood transfusion without reported diagnosis    2011-2 units transfused, bleeding ulcer   Bruises easily    CHF (congestive heart failure) ([place])    with PE resolved after    Constipation    Difficulty in swallowing 03-24-13   no longer a problem   DVT (deep venous thrombosis) ([place]) 06-2013   right leg   Dysrhythmia     Hx of A fib    GERD (gastroesophageal reflux disease)    Hyperlipidemia    Leg swelling 03-24-13   not a problem any longer   Peripheral vascular disease ([facility])    Pulmonary embolus ([facility]) 06-2013   Reflux    Shortness of breath    with PE   Vomiting 03-24-13   problem resolved after lap band revision   Past Surgical History:  Procedure Laterality Date   APPENDECTOMY     CARPEL TUNNEL BILATEREAL     CHOLECYSTECTOMY     5'12   COLONOSCOPY N/A 04/10/2013   Procedure: COLONOSCOPY;  Surgeon: Beryle Beams, MD;  Location: [facility] END[facility]COPY;  Service: Endoscopy;  Laterality: N/A;   DIAGNOSTIC LAPAROSCOPY     ENDOVENOUS ABLATION SAPHENOUS VEIN W/ LASER  06-2008   ENDOVENOUS ABLATION SAPHENOUS VEIN W/ LASER Left 12-09-2013   EVLA LEFT GREATER SAPHENOUS VEIN BY TODD EARLY MD   ESOPHAGOGASTRODUODENOSCOPY  12/18/2010   Procedure: ESOPHAGOGASTRODUODENOSCOPY (EGD);  Surgeon: Gatha Mayer, MD;  Location: [facility];  Service: Endoscopy;  Laterality: N/A;   JOINT REPLACEMENT     LAPAROSCOPIC GASTRIC BANDING  01-05-2008   LAPAROSCOPIC GASTRIC BANDING  12/18/2010   01/04/2009   SALPHINGOOOPHORECTOMY Right    cyst removal   TONSILLECTOMY     TOTAL KNEE ARTHROPLASTY Left 07/31/2016   Procedure: LEFT TOTAL KNEE ARTHROPLASTY;  Surgeon: [name], MD;  Location: [facility];  Service: Orthopedics;  Laterality: Left;   WEDGE RESECTION OF OVARY  LEFT OVARY      Current Meds  Medication Sig   acetaminophen (TYLENOL) 325 MG tablet Take 650 mg by mouth every 6 (six) hours as needed for mild pain or moderate pain.   Cholecalciferol (VITAMIN D-3) 125 MCG (5000 UT) TABS Take 1 tablet by mouth daily.   metoprolol succinate (TOPROL-XL) 25 MG 24 hr tablet Take 1  tablet (25 mg total) by mouth daily.   Multiple Vitamin (MULTIVITAMIN) tablet Take 1 tablet by mouth daily.   omeprazole (PRILOSEC) 20 MG capsule Take 20 mg by mouth 2 (two) times daily before a meal.   rivaroxaban (XARELTO) 20 MG TABS tablet Take 20 mg by mouth daily.    [DISCONTINUED] furosemide (LASIX) 20 MG tablet Take 1 tablet by mouth daily.   [DISCONTINUED] metoprolol succinate (TOPROL-XL) 25 MG 24 hr tablet Take 1 tablet by mouth daily.     Allergies:   Contrast media [iodinated diagnostic agents], Penicillins, and Ancef [cefazolin sodium]   Social History   Tobacco Use   Smoking status: Never Smoker   Smokeless tobacco: Never Used  Substance Use Topics   Alcohol use: No    Alcohol/week: 0.0 standard drinks   Drug use: No     Family Hx: The patient's family history includes Arthritis in her mother; Diabetes in her mother; Heart attack in her father; Heart disease in her father; Kidney disease in her father; Peripheral vascular disease in her father.  ROS:   Please see the history of present illness.     All other systems reviewed and are negative.   Prior CV studies:   The following studies were  reviewed today:    Labs/Other Tests and Data Reviewed:    EKG:  No ECG reviewed.  Recent Labs: No results found for requested labs within last 8760 hours.   Recent Lipid Panel No results found for: CHOL, TRIG, HDL, CHOLHDL, LDLCALC, LDLDIRECT  Wt Readings from Last 3 Encounters:  07/29/17 276 lb (125.2 kg)  07/22/17 276 lb 9.6 oz (125.5 kg)  04/24/17 281 lb 12.8 oz (127.8 kg)     Objective:    Vital Signs:  Ht 5\' 6"  (1.676 m)    BMI 44.55 kg/m    VITAL SIGNS:  reviewed GEN:  no acute distress EYES:  sclerae anicteric, EOMI - Extraocular Movements Intact RESPIRATORY:  normal respiratory effort, symmetric expansion CARDIOVASCULAR:  no peripheral edema SKIN:  no rash, lesions or ulcers. MUSCULOSKELETAL:    2+ edema in both  NEURO:  alert and oriented x 3, no obvious focal deficit PSYCH:  normal affect  ASSESSMENT & PLAN:    1. Acute on chronic CHF:  [name]  presents with worsening heart failure symptoms.  She is short of breath.  She showed me her legs by her camera on and she has 2+ edema bilaterally.  She is also having shortness of breath.  The Lasix that was called in last week has not resulted in any significant urine output.  Will discontinue the Lasix and start torsemide 20 mg a day.  We will add potassium chloride 10 mEq a day.  We will have her come into the office and get an echocardiogram, EKG, basic metabolic profile, CBC, TSH.  We will also send her for lower extremity venous duplex scan.  She will call us if she does not get any significant urine output with the torsemide.  We will see her again on July 14 in the office.  2.  Palpitations: She is had paroxysmal atrial fibrillation.  She now has episodes of palpitations that only last for about 1 or 2 seconds.  I doubt that she is having A. fib that only last for this short amount of time.  We will schedule her to have a 3-day Holter monitor.  COVID-19 Education: The signs and symptoms of COVID-19 were discussed  with the patient and how to  seek care for testing (follow up with PCP or arrange E-visit).  The importance of social distancing was discussed today.  Time:   Today, I have spent  18  minutes with the patient with telehealth technology discussing the above problems.     Medication Adjustments/Labs and Tests Ordered: Current medicines are reviewed at length with the patient today.  Concerns regarding medicines are outlined above.   Tests Ordered: Orders Placed This Encounter  Procedures   TSH   Basic Metabolic Panel (BMET)   CBC   LONG TERM MONITOR-LIVE TELEMETRY (3-14 DAYS)   ECHOCARDIOGRAM COMPLETE    Medication Changes: Meds ordered this encounter  Medications   metoprolol succinate (TOPROL-XL) 25 MG 24 hr tablet    Sig: Take 1 tablet (25 mg total) by mouth daily.    Dispense:  90 tablet    Refill:  3   torsemide (DEMADEX) 20 MG tablet    Sig: Take 1 tablet (20 mg total) by mouth daily.    Dispense:  90 tablet    Refill:  3   potassium chloride (K-DUR) 10 MEQ tablet    Sig: Take 1 tablet (10 mEq total) by mouth daily.    Dispense:  90 tablet    Refill:  3    Disposition:  Follow up in 6 month(s)  Signed, Mertie Moores, MD  07/18/2018 3:42 PM    [facility]

## 2018-07-18 ENCOUNTER — Telehealth: Payer: Self-pay | Admitting: Radiology

## 2018-07-18 ENCOUNTER — Telehealth (HOSPITAL_COMMUNITY): Payer: Self-pay | Admitting: Radiology

## 2018-07-18 NOTE — Telephone Encounter (Signed)
Gave echo instructions to her husband. Unable to perform COVID prescreening test.

## 2018-07-18 NOTE — Telephone Encounter (Signed)
Tried calling patient to verify address/insurance info to have monitor mailed. Also need to briefly go over monitor instructions. There was a bad connection when I called. Tried again and patient didn't answer. Will try again later.

## 2018-07-18 NOTE — Telephone Encounter (Signed)
Enrolled patient for a 3 day [employer]io Telemetry monitor to be mailed. Brief instructions were gone over with the patient and she knows to expect the monitor to arrive in 3-4 days

## 2018-07-21 ENCOUNTER — Ambulatory Visit (INDEPENDENT_AMBULATORY_CARE_PROVIDER_SITE_OTHER): Payer: 59 | Admitting: *Deleted

## 2018-07-21 ENCOUNTER — Other Ambulatory Visit: Payer: 59 | Admitting: *Deleted

## 2018-07-21 ENCOUNTER — Ambulatory Visit (HOSPITAL_COMMUNITY): Payer: 59 | Attending: Cardiovascular Disease

## 2018-07-21 ENCOUNTER — Other Ambulatory Visit: Payer: Self-pay

## 2018-07-21 VITALS — HR 81 | Wt 315.0 lb

## 2018-07-21 DIAGNOSIS — R0600 Dyspnea, unspecified: Secondary | ICD-10-CM

## 2018-07-21 DIAGNOSIS — R0609 Other forms of dyspnea: Secondary | ICD-10-CM | POA: Insufficient documentation

## 2018-07-21 DIAGNOSIS — M7989 Other specified soft tissue disorders: Secondary | ICD-10-CM | POA: Insufficient documentation

## 2018-07-21 DIAGNOSIS — I480 Paroxysmal atrial fibrillation: Secondary | ICD-10-CM | POA: Diagnosis not present

## 2018-07-21 DIAGNOSIS — R002 Palpitations: Secondary | ICD-10-CM

## 2018-07-21 NOTE — Progress Notes (Signed)
1.) Reason for visit: EKG  2.) Name of MD requesting visit: Dr. Acie Fredrickson   3.) H&P: viritual visit w/ Dr. Acie Fredrickson. Pt reported palptitations  4.) ROS related to problem: EKG NSR incomplete RBBB. HR 81 today.  Reviewed by DOD Dr. Angelena Form  5.) Assessment and plan per MD: No changes at this time.  Pt also had labs and echo today.

## 2018-07-22 ENCOUNTER — Telehealth: Payer: Self-pay

## 2018-07-22 LAB — BASIC METABOLIC PANEL
BUN/Creatinine Ratio: 10 (ref 9–23)
BUN: 7 mg/dL (ref 6–24)
CO2: 23 mmol/L (ref 20–29)
Calcium: 8.8 mg/dL (ref 8.7–10.2)
Chloride: 100 mmol/L (ref 96–106)
Creatinine, Ser: 0.67 mg/dL (ref 0.57–1.00)
GFR calc Af Amer: 111 mL/min/{1.73_m2} (ref >59)
GFR calc non Af Amer: 97 mL/min/{1.73_m2} (ref >59)
Glucose: 115 mg/dL — ABNORMAL HIGH (ref 65–99)
Potassium: 3.5 mmol/L (ref 3.5–5.2)
Sodium: 142 mmol/L (ref 134–144)

## 2018-07-22 LAB — CBC
Hematocrit: 41 % (ref 34.0–46.6)
Hemoglobin: 12.9 g/dL (ref 11.1–15.9)
MCH: 24.5 pg — ABNORMAL LOW (ref 26.6–33.0)
MCHC: 31.5 g/dL (ref 31.5–35.7)
MCV: 78 fL — ABNORMAL LOW (ref 79–97)
Platelets: 205 10*3/uL (ref 150–450)
RBC: 5.27 x10E6/uL (ref 3.77–5.28)
RDW: 16.9 % — ABNORMAL HIGH (ref 11.7–15.4)
WBC: 4.7 10*3/uL (ref 3.4–10.8)

## 2018-07-22 LAB — TSH: TSH: 2.05 u[IU]/mL (ref 0.450–4.500)

## 2018-07-22 NOTE — Telephone Encounter (Signed)
Notes recorded by Frederik Schmidt, RN on 07/22/2018 at 8:56 AM EDT  The patient has been notified of the Echo result and verbalized understanding. All questions (if any) were answered.  Frederik Schmidt, RN 07/22/2018 8:55 AM

## 2018-07-22 NOTE — Telephone Encounter (Signed)
-----   Message from Fay Records, MD sent at 07/21/2018  8:27 PM EDT ----- Echo shows normal pumping function of R and L ventricles  Diastolic properties normal Keep on same meds    WIll alert Dr Acie Fredrickson

## 2018-07-23 ENCOUNTER — Ambulatory Visit (INDEPENDENT_AMBULATORY_CARE_PROVIDER_SITE_OTHER): Payer: 59

## 2018-07-23 DIAGNOSIS — M7989 Other specified soft tissue disorders: Secondary | ICD-10-CM | POA: Diagnosis not present

## 2018-07-23 DIAGNOSIS — I480 Paroxysmal atrial fibrillation: Secondary | ICD-10-CM

## 2018-07-23 DIAGNOSIS — R0600 Dyspnea, unspecified: Secondary | ICD-10-CM

## 2018-07-23 DIAGNOSIS — R0609 Other forms of dyspnea: Secondary | ICD-10-CM | POA: Diagnosis not present

## 2018-07-31 ENCOUNTER — Other Ambulatory Visit: Payer: Self-pay

## 2018-08-18 ENCOUNTER — Telehealth: Payer: Self-pay | Admitting: Cardiovascular Disease

## 2018-08-18 NOTE — Telephone Encounter (Signed)
New Message         COVID-19 Pre-Screening Questions:   In the past 7 to 10 days have you had a cough,  shortness of breath, headache, congestion, fever (100 or greater) body aches, chills, sore throat, or sudden loss of taste or sense of smell? Some SOB she says due to her heart   Have you been around anyone with known Covid 19. NO  Have you been around anyone who is awaiting Covid 19 test results in the past 7 to 10 days? NO  Have you been around anyone who has been exposed to Covid 19, or has mentioned symptoms of Covid 19 within the past 7 to 10 days? NO  If you have any concerns/questions about symptoms patients report during screening (either on the phone or at threshold). Contact the provider seeing the patient or DOD for further guidance.  If neither are available contact a member of the leadership team.

## 2018-08-18 NOTE — Telephone Encounter (Signed)
Reroute

## 2018-08-19 ENCOUNTER — Other Ambulatory Visit: Payer: Self-pay

## 2018-08-19 ENCOUNTER — Ambulatory Visit (INDEPENDENT_AMBULATORY_CARE_PROVIDER_SITE_OTHER): Payer: 59 | Admitting: Cardiovascular Disease

## 2018-08-19 ENCOUNTER — Encounter: Payer: Self-pay | Admitting: Cardiovascular Disease

## 2018-08-19 VITALS — BP 98/76 | HR 87 | Ht 66.0 in | Wt 317.0 lb

## 2018-08-19 DIAGNOSIS — I480 Paroxysmal atrial fibrillation: Secondary | ICD-10-CM

## 2018-08-19 DIAGNOSIS — R600 Localized edema: Secondary | ICD-10-CM | POA: Diagnosis not present

## 2018-08-19 MED ORDER — POTASSIUM CHLORIDE ER 10 MEQ PO TBCR: 10.0000 meq | EXTENDED_RELEASE_TABLET | ORAL | 3 refills | Status: DC

## 2018-08-19 MED ORDER — TORSEMIDE 20 MG PO TABS: 20.0000 mg | ORAL_TABLET | ORAL | 3 refills | Status: DC

## 2018-08-19 NOTE — Progress Notes (Signed)
Virtual Visit via Video Note   This visit type was conducted due to national recommendations for restrictions regarding the COVID-19 Pandemic (e.g. social distancing) in an effort to limit this patient's exposure and mitigate transmission in our community.  Due to her co-morbid illnesses, this patient is at least at moderate risk for complications without adequate follow up.  This format is felt to be most appropriate for this patient at this time.  All issues noted in this document were discussed and addressed.  A limited physical exam was performed with this format.  Please refer to the patient's chart for her consent to telehealth for [facility].   Date:  08/19/2018   ID:  [name], DOB Apr 30, 1960, MRN [id number]  Patient Location: Home Provider Location: Home  PCP:  Mateo Flow, MD  Cardiologist:  Mertie Moores, MD  Electrophysiologist:  None   Problem List Problem list 1. Pulmonary embolus 2. Atrial fibrillation 3.  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  The patient has an interesting cardiopulmonary history.  She was in her usual state of health until late April 2015 when she began to have pulmonary congestion symptoms and thought that she had the flu.  She saw her PCP in [place] several times initially her oxygen saturation at the first visit was 96% and the second visit was 92%.  She continued to worsen and collapsed at home.  She was brought to [facility] where in the emergency room her d-dimer was 13 and she underwent a CT angiogram which showed massive pulmonary embolus with saddle embolus.  She had slight elevation of her troponins consistent with pulmonary embolus.  Her initial echocardiogram on 06/11/13 showed a moderate hypertension with a pulmonary artery pressure of 41.  The ventricular septum showed paradoxic motion consistent with right ventricular volume overload.  There was a probable medium sized ostium secundum ASD present.   The right atrium was markedly dilated.  The patient went on to be treated with transcatheter ultrasound assisted thrombolytic infusion therapy for her saddle embolus.  She had a good clinical response.  She had a followup echocardiogram on 06/22/13 which showed that her pulmonary artery pressure had returned to normal at 16.  The right atrial size was normal.  The right ventricular size was normal.  There was no evidence of ASD or PFO. The patient was evaluated for hypercoagulable state as the cause of her pulmonary embolus and her DVT of her right leg.  However the hypercoagulability workup was negative.  The patient does not have any family history of abnormal blood clotting. The patient herself has no prior cardiac history.  She does have a past history of morbid obesity and has had lap band surgery by Dr. Johnathan Hausen.  The patient initially weighed about 388 pounds.  Since her surgery she has lost approximately 130 pounds. She still is experiencing mild exertional dyspnea.  She denies any chest discomfort.  She has not been aware of any racing of her heart.  May 06, 2015 Was recently found have atrial fibrillation. She's been having some increased shortness of breath and fatigue. [facility] and was found have atrial fibrillation at rate of 154. Was treated with Diltiazem and metoprolol and she feels much better.   Thinks that she has converted .  July 10, 2016:  [name] is here for pre- op clearance for left knee replacement  Has not any noticeable episodes of Afib .  On Xarelto .  July 22, 2017:  Had an episode of PAF after taking cold meds.  Resolved after fluid, rest, and an extra metoprolol  Has mild swelling in feet  Is not getting exercise, sits most of the day  Wt. Is 276.    Evaluation Performed:  Follow-Up Visit  Chief Complaint:  Follow up PAF and hx of pulmonary embolus  July 17, 2018   [name] is a 58 y.o. female with a  history of paroxysmal atrial fibrillation and a history of pulmonary embolus.  Several weeks ago woke up with AF,  Dyspnea, Lasted for 45 min Took 2nd metoprolol at that time Developed leg swelling and shortness of breath Was started on lasix  Leg edema has not improved despite lasix  ( she showed me her legs on camara )  Left >> right leg swelling   The patient does not have symptoms concerning for COVID-19 infection (fever, chills, cough, or new shortness of breath).    August 19, 2018:  [name] is seen today for follow up visit.     Wt is 317 lbs  - up 40 lbs  Had lap band surgery 10 years ago .   We saw her in June - telemedicine visit -   Had developed 2+ leg edema  We changed the lasix to Torsemide.  Echo for normal left ventricular systolic function.  Ejection fraction is 60 to 65%.  Normal diastolic function.  Mild thickening of the aortic valve.  Event monitor showed rare episoes of NSVT.     Past Medical History:  Diagnosis Date   Anemia    Arthritis    Atrial fibrillation ([place])    Bleeding ulcer 12-2010  hospitalized for 1 week   Blood transfusion    Blood transfusion without reported diagnosis    2011-2 units transfused, bleeding ulcer   Bruises easily    CHF (congestive heart failure) ([place])    with PE resolved after    Constipation    Difficulty in swallowing 03-24-13   no longer a problem   DVT (deep venous thrombosis) ([place]) 06-2013   right leg   Dysrhythmia     Hx of A fib    GERD (gastroesophageal reflux disease)    Hyperlipidemia    Leg swelling 03-24-13   not a problem any longer   Peripheral vascular disease (HCC)    Pulmonary embolus (HCC) 06-2013   Reflux    Shortness of breath    with PE   Vomiting 03-24-13   problem resolved after lap band revision   Past Surgical History:  Procedure Laterality Date   APPENDECTOMY     CARPEL TUNNEL BILATEREAL     CHOLECYSTECTOMY     5'12   COLONOSCOPY N/A 04/10/2013   Procedure:  COLONOSCOPY;  Surgeon: Beryle Beams, MD;  Location: [facility];  Service: Endoscopy;  Laterality: N/A;   DIAGNOSTIC LAPAROSCOPY     ENDOVENOUS ABLATION SAPHENOUS VEIN W/ LASER  06-2008   ENDOVENOUS ABLATION SAPHENOUS VEIN W/ LASER Left 12-09-2013   EVLA LEFT GREATER SAPHENOUS VEIN BY TODD EARLY MD   ESOPHAGOGASTRODUODENOSCOPY  12/18/2010   Procedure: ESOPHAGOGASTRODUODENOSCOPY (EGD);  Surgeon: Gatha Mayer, MD;  Location: [facility];  Service: Endoscopy;  Laterality: N/A;   JOINT REPLACEMENT     LAPAROSCOPIC GASTRIC BANDING  01-05-2008   LAPAROSCOPIC GASTRIC BANDING  12/18/2010   01/04/2009   SALPHINGOOOPHORECTOMY Right    cyst removal   TONSILLECTOMY     TOTAL KNEE  ARTHROPLASTY Left 07/31/2016   Procedure: LEFT TOTAL KNEE ARTHROPLASTY;  Surgeon: [name], MD;  Location: [facility];  Service: Orthopedics;  Laterality: Left;   WEDGE RESECTION OF OVARY  LEFT OVARY      Current Meds  Medication Sig   acetaminophen (TYLENOL) 325 MG tablet Take 650 mg by mouth every 6 (six) hours as needed for mild pain or moderate pain.   Cholecalciferol (VITAMIN D-3) 125 MCG (5000 UT) TABS Take 1 tablet by mouth daily.   metoprolol succinate (TOPROL-XL) 25 MG 24 hr tablet Take 1 tablet (25 mg total) by mouth daily.   Multiple Vitamin (MULTIVITAMIN) tablet Take 1 tablet by mouth daily.   omeprazole (PRILOSEC) 20 MG capsule Take 20 mg by mouth 2 (two) times daily before a meal.   rivaroxaban (XARELTO) 20 MG TABS tablet Take 20 mg by mouth daily.    SYMBICORT 80-4.5 MCG/ACT inhaler Inhale 2 puffs into the lungs as needed.   [DISCONTINUED] potassium chloride (K-DUR) 10 MEQ tablet Take 1 tablet (10 mEq total) by mouth daily.   [DISCONTINUED] torsemide (DEMADEX) 20 MG tablet Take 1 tablet (20 mg total) by mouth daily.     Allergies:   Contrast media [iodinated diagnostic agents], Penicillins, and Ancef [cefazolin sodium]   Social History   Tobacco Use   Smoking  status: Never Smoker   Smokeless tobacco: Never Used  Substance Use Topics   Alcohol use: No    Alcohol/week: 0.0 standard drinks   Drug use: No     Family Hx: The patient's family history includes Arthritis in her mother; Diabetes in her mother; Heart attack in her father; Heart disease in her father; Kidney disease in her father; Peripheral vascular disease in her father.  ROS:   Please see the history of present illness.     All other systems reviewed and are negative.   Prior CV studies:   The following studies were reviewed today:    Labs/Other Tests and Data Reviewed:    EKG:  No ECG reviewed.  Recent Labs: 07/21/2018: BUN 7; Creatinine, Ser 0.67; Hemoglobin 12.9; Platelets 205; Potassium 3.5; Sodium 142; TSH 2.050   Recent Lipid Panel No results found for: CHOL, TRIG, HDL, CHOLHDL, LDLCALC, LDLDIRECT  Wt Readings from Last 3 Encounters:  08/19/18 (!) 317 lb (143.8 kg)  07/21/18 (!) 315 lb (142.9 kg)  08/30/16 280 lb (127 kg)     Objective:     Physical Exam: Blood pressure 98/76, pulse 87, height 5\' 6"  (1.676 m), weight (!) 317 lb (143.8 kg), SpO2 95 %.  GEN:  Morbidly obese female  HEENT: Normal NECK: No JVD; No carotid bruits LYMPHATICS: No lymphadenopathy CARDIAC: RRR   RESPIRATORY:  Clear to auscultation without rales, wheezing or rhonchi  ABDOMEN: Soft, non-tender, non-distended MUSCULOSKELETAL: legs are very large.   Trace edema  SKIN: Warm and dry NEUROLOGIC:  Alert and oriented x 3   ASSESSMENT & PLAN:    1. Acute leg edema: The patient had an echocardiogram which showed that she has normal left ventricular systolic and diastolic function.  I still suspect that her leg edema is due to her morbid obesity and lack of exercise.  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  We will decrease the torsemide to 20 mg just 3 times a week and also potassium chloride will be 10 mEq 3 times a week.  She will call if she gains any fluid.  I encouraged her to watch  her salt and to  start an exercise program.  I really think that her leg edema would significantly improve if she watches her salt and start an exercise program.  We also discussed leg elevation.  She is interested in consulting [facility] for consideration for gastric bypass surgery.  From a cardiac standpoint she would have no contraindications insert cardiac function is completely normal.   2.  Palpitations:   Event monitor showed several episodes of nonsustained ventricular tachycardia.  She is not having significant palpitations at this time.  We will continue to follow.    Medication Adjustments/Labs and Tests Ordered: Current medicines are reviewed at length with the patient today.  Concerns regarding medicines are outlined above.   Tests Ordered: No orders of the defined types were placed in this encounter.   Medication Changes: Meds ordered this encounter  Medications   torsemide (DEMADEX) 20 MG tablet    Sig: Take 1 tablet (20 mg total) by mouth 3 (three) times a week.    Dispense:  45 tablet    Refill:  3   potassium chloride (K-DUR) 10 MEQ tablet    Sig: Take 1 tablet (10 mEq total) by mouth 3 (three) times a week.    Dispense:  45 tablet    Refill:  3    Disposition:  Follow up in 6 month(s)  Signed, Mertie Moores, MD  08/19/2018 10:15 AM    [facility]

## 2018-08-19 NOTE — Patient Instructions (Signed)
Medication Instructions:  Your physician has recommended you make the following change in your medication:  DECREASE Torsemide (Demadex) to 20 mg 3 days per week (please do not skip more than 2 days between doses) DECREASE Kdur (Potassium Chloride) to 10 mEq on the same 3 days per week  If you need a refill on your cardiac medications before your next appointment, please call your pharmacy.    Lab work: None Ordered   Testing/Procedures: None Ordered   Follow-Up: At [facility], you and your health needs are our priority.  As part of our continuing mission to provide you with exceptional heart care, we have created designated Provider Care Teams.  These Care Teams include your primary Cardiologist (physician) and Advanced Practice Providers (APPs -  Physician Assistants and Nurse Practitioners) who all work together to provide you with the care you need, when you need it. You will need a follow up appointment in:  6 months.  Please call our office 2 months in advance to schedule this appointment.  You may see Mertie Moores, MD or one of the following Advanced Practice Providers on your designated Care Team: Richardson Dopp, PA-C [place], [place] . Daune Perch, NP

## 2018-10-15 ENCOUNTER — Ambulatory Visit: Payer: 59 | Admitting: Cardiovascular Disease

## 2018-10-20 ENCOUNTER — Ambulatory Visit: Payer: 59 | Admitting: Orthopaedic Surgery

## 2018-10-20 NOTE — Progress Notes (Signed)
Office Visit Note  Patient: [name]             Date of Birth: 11/29/1960           MRN: [id number]             PCP: Mateo Flow, MD Referring: Mateo Flow, MD Visit Date: 10/23/2018 Occupation: @GUAROCC @  Subjective:  Pain and swelling in both hands.   History of Present Illness: [name] is a 58 y.o. female with history of seropositive rheumatoid arthritis.  She reports for the past 1.5 months she has been experiencing increased pain and stiffness in multiple joints. She states the morning stiffness in her hands has been lasting longer.  She is having swelling in both hands. She is also having profound fatigue.  She was previously taking Plaquenil for several months in 2018 but she states PLQ did not seem effective.  She has not been on treatment since 2018.   Activities of Daily Living:  Patient reports morning stiffness for 30 minutes.   Patient Reports nocturnal pain.  Difficulty dressing/grooming: Denies Difficulty climbing stairs: Reports Difficulty getting out of chair: Reports Difficulty using hands for taps, buttons, cutlery, and/or writing: Reports  Review of Systems  Constitutional: Positive for fatigue.  HENT: Negative for mouth sores, mouth dryness and nose dryness.   Eyes: Positive for dryness. Negative for pain, itching and visual disturbance.  Respiratory: Negative for cough, hemoptysis, shortness of breath and difficulty breathing.   Cardiovascular: Negative for chest pain, palpitations, hypertension and swelling in legs/feet.  Gastrointestinal: Positive for constipation. Negative for abdominal pain, blood in stool and diarrhea.  Endocrine: Negative for increased urination.  Genitourinary: Negative for difficulty urinating and painful urination.  Musculoskeletal: Positive for arthralgias, joint pain, joint swelling and morning stiffness. Negative for myalgias, muscle weakness, muscle tenderness and myalgias.  Skin: Positive for hair loss.  Negative for color change, pallor, rash, nodules/bumps, skin tightness, ulcers and sensitivity to sunlight.  Allergic/Immunologic: Negative for susceptible to infections.  Neurological: Negative for dizziness, numbness, headaches, memory loss and weakness.  Hematological: Negative for swollen glands.  Psychiatric/Behavioral: Negative for depressed mood, confusion and sleep disturbance. The patient is not nervous/anxious.     PMFS History:  Patient Active Problem List   Diagnosis Date Noted  . Leg edema 08/19/2018  . Presence of left artificial knee joint 08/14/2016  . Unilateral primary osteoarthritis, left knee 07/31/2016  . Status post total knee replacement, left 07/31/2016  . Abdominal hematoma   . Post-operative state   . Paroxysmal atrial fibrillation (HCC)   . Controlled type 2 diabetes mellitus with diabetic nephropathy ([place])   . Wound infection 01/25/2016  . Hydronephrosis, left 01/25/2016  . Hydronephrosis, right 01/25/2016  . DVT (deep venous thrombosis) ([place]) 01/25/2016  . Atrial fibrillation ([place]) 01/25/2016  . Diabetes mellitus with complication ([place])   . Wound infection after surgery, initial encounter   . Varicose veins of lower extremities with complications 99991111  . Pain and swelling of left lower leg 10/27/2013  . Pain of left lower extremity 10/27/2013  . Gastroenteritis, acute 09/08/2013  . Fever, unspecified 09/04/2013  . ASD (atrial septal defect) 08/13/2013  . Pulmonary embolism ([place]) 06/22/2013  . Dyspnea 06/11/2013  . Anterior slip repaired Oct 2012 01/18/2011  . GI bleed 12/18/2010  . Diabetes mellitus ([place]) 12/18/2010  . Varicose veins of lower extremities with other complications 99991111  . Diabetes mellitus   . HIP PAIN, RIGHT 03/06/2010    Past  Medical History:  Diagnosis Date  . Anemia   . Arthritis   . Atrial fibrillation ([place])   . Bleeding ulcer 12-2010  hospitalized for 1 week  . Blood transfusion   . Blood transfusion without  reported diagnosis    2011-2 units transfused, bleeding ulcer  . Bruises easily   . CHF (congestive heart failure) ([place])    with PE resolved after   . Constipation   . Difficulty in swallowing 03-24-13   no longer a problem  . DVT (deep venous thrombosis) ([place]) 06-2013   right leg  . Dysrhythmia     Hx of A fib   . GERD (gastroesophageal reflux disease)   . Hyperlipidemia   . Leg swelling 03-24-13   not a problem any longer  . Peripheral vascular disease ([place])   . Pulmonary embolus ([place]) 06-2013  . Reflux   . Shortness of breath    with PE  . Vomiting 03-24-13   problem resolved after lap band revision    Family History  Problem Relation Age of Onset  . Heart disease Father   . Kidney disease Father   . Peripheral vascular disease Father   . Heart attack Father        74  . Diabetes Mother   . Arthritis Mother    Past Surgical History:  Procedure Laterality Date  . APPENDECTOMY    . CARPEL TUNNEL BILATEREAL    . CHOLECYSTECTOMY     5'12  . COLONOSCOPY N/A 04/10/2013   Procedure: COLONOSCOPY;  Surgeon: Beryle Beams, MD;  Location: [facility];  Service: Endoscopy;  Laterality: N/A;  . DIAGNOSTIC LAPAROSCOPY    . ENDOVENOUS ABLATION SAPHENOUS VEIN W/ LASER  06-2008  . ENDOVENOUS ABLATION SAPHENOUS VEIN W/ LASER Left 12-09-2013   EVLA LEFT GREATER SAPHENOUS VEIN BY TODD EARLY MD  . ESOPHAGOGASTRODUODENOSCOPY  12/18/2010   Procedure: ESOPHAGOGASTRODUODENOSCOPY (EGD);  Surgeon: Gatha Mayer, MD;  Location: [facility];  Service: Endoscopy;  Laterality: N/A;  . JOINT REPLACEMENT    . LAPAROSCOPIC GASTRIC BANDING  01-05-2008  . LAPAROSCOPIC GASTRIC BANDING  12/18/2010   01/04/2009  . SALPHINGOOOPHORECTOMY Right    cyst removal  . TONSILLECTOMY    . TOTAL KNEE ARTHROPLASTY Left 07/31/2016   Procedure: LEFT TOTAL KNEE ARTHROPLASTY;  Surgeon: [name], MD;  Location: [facility];  Service: Orthopedics;  Laterality: Left;  . WEDGE RESECTION OF OVARY  LEFT  OVARY    Social History   Social History Narrative  . Not on file   Immunization History  Administered Date(s) Administered  . Influenza Split 10/06/2012  . Influenza,inj,Quad PF,6+ Mos 01/30/2016  . Pneumococcal Conjugate-13 11/29/2015     Objective: Vital Signs: BP (!) 147/82 (BP Location: Left Wrist, Patient Position: Sitting, Cuff Size: Normal)   Pulse 76   Resp 16   Ht 5\' 6"  (1.676 m)   Wt (!) 323 lb (146.5 kg)   BMI 52.13 kg/m    Physical Exam Vitals signs and nursing note reviewed.  Constitutional:      Appearance: She is well-developed.  HENT:     Head: Normocephalic and atraumatic.  Eyes:     Conjunctiva/sclera: Conjunctivae normal.  Neck:     Musculoskeletal: Normal range of motion.  Cardiovascular:     Rate and Rhythm: Normal rate and regular rhythm.     Heart sounds: Normal heart sounds.  Pulmonary:     Effort: Pulmonary effort is normal.     Breath sounds: Normal breath  sounds.  Abdominal:     General: Bowel sounds are normal.     Palpations: Abdomen is soft.  Lymphadenopathy:     Cervical: No cervical adenopathy.  Skin:    General: Skin is warm and dry.     Capillary Refill: Capillary refill takes less than 2 seconds.  Neurological:     Mental Status: She is alert and oriented to person, place, and time.  Psychiatric:        Behavior: Behavior normal.      Musculoskeletal Exam: C-spine, thoracic spine, and lumbar spine good ROM.  No midline spinal tenderness.  No SI joint tenderness.  Shoulder joints, elbow joints, wrist joints, MCPs, PIPs, and DIPs good ROM with no synovitis.  Complete fist formation bilaterally.  Tenderness of the right 5th MCP and PIP and left 5th MCP and PIP and 4th PIP joint.  Hip joints good ROM with discomfort bilaterally.  Right knee warmth but no effusion noted.  Left knee replacement warmth but no effusion. Ankle joints good ROM.  Severe pedal edema bilaterally.   CDAI Exam: CDAI Score: - Patient Global: -; Provider  Global: - Swollen: 0 ; Tender: 5  Joint Exam      Right  Left  MCP 5   Tender   Tender  PIP 4      Tender  PIP 5   Tender   Tender     Investigation: No additional findings.  Imaging: No results found.  Recent Labs: Lab Results  Component Value Date   WBC 4.7 07/21/2018   HGB 12.9 07/21/2018   PLT 205 07/21/2018   NA 142 07/21/2018   K 3.5 07/21/2018   CL 100 07/21/2018   CO2 23 07/21/2018   GLUCOSE 115 (H) 07/21/2018   BUN 7 07/21/2018   CREATININE 0.67 07/21/2018   BILITOT 0.4 08/30/2016   ALKPHOS 69 08/30/2016   AST 18 08/30/2016   ALT 12 (L) 08/30/2016   PROT 6.8 08/30/2016   ALBUMIN 3.8 08/30/2016   CALCIUM 8.8 07/21/2018   GFRAA 111 07/21/2018    Speciality Comments: PLQ eye exam: 10/31/17 Normal. [facility]. Follow up in 1 year.  Procedures:  No procedures performed Allergies: Contrast media [iodinated diagnostic agents], Penicillins, and Ancef [cefazolin sodium]   Assessment / Plan:     Visit Diagnoses: Rheumatoid arthritis with rheumatoid factor of multiple sites without organ or systems involvement ([place]) - positive rheumatoid factor, negative ANA, negative CCP.last seen in 2018: She has no obvious synovitis on exam today.  She does have tenderness of bilateral fifth MCPs, PIPs, and left fourth PIP joint.  She has been experiencing increased pain in bilateral hands for the past 1-1/2 months.  She is also been experiencing morning stiffness lasting about 30 minutes.  She has noticed intermittent swelling in both hands.  She was previously taking Plaquenil 200 mg 1 tablet by mouth twice daily Monday through Friday and 2018.  She did not notice much improvement when taking Plaquenil and her symptoms were very mild so she discontinued.  She would like to restart on Plaquenil at this time.  Indications, contraindications, potential side effects of Plaquenil were discussed today.  All questions were addressed and consent was obtained.  She was advised to notify us  if she cannot tolerate Plaquenil.  She will follow-up in the office in 1 month for lab work and a visit.  Patient was counseled on the purpose, proper use, and adverse effects of hydroxychloroquine including nausea/diarrhea, skin rash, headaches,  and sun sensitivity.  Discussed importance of annual eye exams while on hydroxychloroquine to monitor to ocular toxicity and discussed importance of frequent laboratory monitoring.  Provided patient with eye exam form for baseline ophthalmologic exam.  Provided patient with educational materials on hydroxychloroquine and answered all questions.  Patient consented to hydroxychloroquine.  Will upload consent in the media tab.    Dose will be Plaquenil 200 mg twice daily.  Prescription pending lab results.   High risk medication use - Plaquenil 200 mg 1 tablet by mouth twice daily.  CBC and BMP were drawn on 07/21/2018.  She will return for lab work in 1 month, 3 months, then every 5 months.  We discussed scheduling a baseline Plaquenil eye exam.  History of arthroplasty of left knee - Dr. Ninfa Linden, 2018: Doing well.  She has good ROM with no discomfort.  Warmth noted but no effusion.   Other medical conditions are listed as follows:   ASD (atrial septal defect)  Paroxysmal atrial fibrillation ([place])  Controlled type 2 diabetes mellitus with diabetic nephropathy, unspecified whether long term insulin use (HCC)  Hydronephrosis, left  Hydronephrosis, right  History of DVT (deep vein thrombosis)  Orders: No orders of the defined types were placed in this encounter.  No orders of the defined types were placed in this encounter.   Face-to-face time spent with patient was 30 minutes. Greater than 50% of time was spent in counseling and coordination of care.  Follow-Up Instructions: Return in about 4 weeks (around 11/20/2018) for Rheumatoid arthritis.   Ofilia Neas, PA-C  Note - This record has been created using Dragon software.  Chart creation  errors have been sought, but may not always  have been located. Such creation errors do not reflect on  the standard of medical care.

## 2018-10-23 ENCOUNTER — Other Ambulatory Visit: Payer: Self-pay

## 2018-10-23 ENCOUNTER — Ambulatory Visit (INDEPENDENT_AMBULATORY_CARE_PROVIDER_SITE_OTHER): Payer: 59 | Admitting: Orthopaedic Surgery

## 2018-10-23 ENCOUNTER — Encounter: Payer: Self-pay | Admitting: Orthopaedic Surgery

## 2018-10-23 ENCOUNTER — Ambulatory Visit (INDEPENDENT_AMBULATORY_CARE_PROVIDER_SITE_OTHER): Payer: 59 | Admitting: Physician Assistant

## 2018-10-23 ENCOUNTER — Encounter: Payer: Self-pay | Admitting: Physician Assistant

## 2018-10-23 VITALS — BP 147/82 | HR 76 | Resp 16 | Ht 66.0 in | Wt 323.0 lb

## 2018-10-23 DIAGNOSIS — I480 Paroxysmal atrial fibrillation: Secondary | ICD-10-CM

## 2018-10-23 DIAGNOSIS — M7061 Trochanteric bursitis, right hip: Secondary | ICD-10-CM | POA: Diagnosis not present

## 2018-10-23 DIAGNOSIS — Z96652 Presence of left artificial knee joint: Secondary | ICD-10-CM | POA: Diagnosis not present

## 2018-10-23 DIAGNOSIS — M0579 Rheumatoid arthritis with rheumatoid factor of multiple sites without organ or systems involvement: Secondary | ICD-10-CM | POA: Diagnosis not present

## 2018-10-23 DIAGNOSIS — N1330 Unspecified hydronephrosis: Secondary | ICD-10-CM

## 2018-10-23 DIAGNOSIS — E1121 Type 2 diabetes mellitus with diabetic nephropathy: Secondary | ICD-10-CM

## 2018-10-23 DIAGNOSIS — Z79899 Other long term (current) drug therapy: Secondary | ICD-10-CM | POA: Diagnosis not present

## 2018-10-23 DIAGNOSIS — M25561 Pain in right knee: Secondary | ICD-10-CM

## 2018-10-23 DIAGNOSIS — Q211 Atrial septal defect, unspecified: Secondary | ICD-10-CM

## 2018-10-23 DIAGNOSIS — Z86718 Personal history of other venous thrombosis and embolism: Secondary | ICD-10-CM

## 2018-10-23 MED ORDER — METHYLPREDNISOLONE ACETATE 40 MG/ML IJ SUSP
40.0000 mg | INTRAMUSCULAR | Status: AC | PRN
  Administered 2018-10-23: 40 mg via INTRA_ARTICULAR

## 2018-10-23 MED ORDER — LIDOCAINE HCL 1 % IJ SOLN
3.0000 mL | INTRAMUSCULAR | Status: AC | PRN
  Administered 2018-10-23: 3 mL

## 2018-10-23 MED ORDER — HYDROXYCHLOROQUINE SULFATE 200 MG PO TABS: 200.0000 mg | ORAL_TABLET | Freq: Two times a day (BID) | ORAL | 0 refills | Status: DC

## 2018-10-23 MED ORDER — ACETAMINOPHEN-CODEINE #3 300-30 MG PO TABS: 1.0000 | ORAL_TABLET | Freq: Three times a day (TID) | ORAL | 0 refills | Status: DC | PRN

## 2018-10-23 NOTE — Patient Instructions (Addendum)
Standing Labs We placed an order today for your standing lab work.    Please come back and get your standing labs in 1 month, 3 months, then every 5 months   We have open lab daily Monday through Thursday from 8:30-12:30 PM and 1:30-4:30 PM and Friday from 8:30-12:30 PM and 1:30 -4:00 PM at the office of Dr. Bo Merino.   You may experience shorter wait times on Monday and Friday afternoons. The office is located at [address] [place], [place], [place] [address] No appointment is necessary.   Labs are drawn by [facility].  You may receive a bill from [facility] for your lab work.  If you wish to have your labs drawn at another location, please call the office 24 hours in advance to send orders.  If you have any questions regarding directions or hours of operation,  please call [phone].   Just as a reminder please drink plenty of water prior to coming for your lab work. Thanks!

## 2018-10-23 NOTE — Progress Notes (Signed)
Office Visit Note   Patient: [name]           Date of Birth: [birth year]           MRN: [id number] Visit Date: 10/23/2018              Requested by: Sherri Flow, MD [place],  [place] [address] PCP: Sherri Flow, MD   Assessment & Plan: Visit Diagnoses:  1. Trochanteric bursitis, right hip   2. Acute pain of right knee     Plan: Given the severity of her pain of the trochanteric area I do feel it was worthwhile trying a steroid injection in this area.  She tolerated it well.  She cannot take anti-inflammatories due to being on Xarelto.  I will send her to outpatient physical therapy to work on strengthening her leg as well as stretching exercises and other modalities to decrease her pain.  I did show her some stretching exercises to try as well.  I have also recommended topical Voltaren gel.  I will send in some Tylenol 3 for her to have on occasion when she needs it at night for pain.  All question concerns were answered addressed.  We will see her sometime around the first week of November after she is had a good course of physical therapy.  I can always repeat a steroid injection of her trochanteric area comfortably at that visit if needed since she is not a diabetic and this is not an intra-articular injection.  Follow-Up Instructions: Return in about 6 weeks (around 12/04/2018).   Orders:  Orders Placed This Encounter  Procedures  . Large Joint Inj  . Large Joint Inj   Meds ordered this encounter  Medications  . acetaminophen-codeine (TYLENOL #3) 300-30 MG tablet    Sig: Take 1-2 tablets by mouth every 8 (eight) hours as needed for moderate pain.    Dispense:  30 tablet    Refill:  0      Procedures: Large Joint Inj: R knee on 10/23/2018 4:01 PM Indications: diagnostic evaluation and pain Details: 22 G 1.5 in needle, superolateral approach  Arthrogram: No  Medications: 3 mL lidocaine 1 %; 40 mg methylPREDNISolone acetate 40 MG/ML Outcome:  tolerated well, no immediate complications Procedure, treatment alternatives, risks and benefits explained, specific risks discussed. Consent was given by the patient. Immediately prior to procedure a time out was called to verify the correct patient, procedure, equipment, support staff and site/side marked as required. Patient was prepped and draped in the usual sterile fashion.   Large Joint Inj: R greater trochanter on 10/23/2018 4:01 PM Indications: pain and diagnostic evaluation Details: 22 G 1.5 in needle, lateral approach  Arthrogram: No  Medications: 3 mL lidocaine 1 %; 40 mg methylPREDNISolone acetate 40 MG/ML Outcome: tolerated well, no immediate complications Procedure, treatment alternatives, risks and benefits explained, specific risks discussed. Consent was given by the patient. Immediately prior to procedure a time out was called to verify the correct patient, procedure, equipment, support staff and site/side marked as required. Patient was prepped and draped in the usual sterile fashion.       Clinical Data: No additional findings.   Subjective: Chief Complaint  Patient presents with  . Right Leg - Pain  [name] comes in today with debilitating pain involving her right hip and right knee.  She denies any known injury but is been significantly worse with weightbearing and activities of daily living in terms of the  pain.  She does feel there is some slight weakness in that leg as well.  She does have a history of a left total knee arthroplasty.  She does have a BMI of over 50.  She denies any acute change in her medical status.  She is on chronic Xarelto so she cannot take anti-inflammatories.  Occasionally she does have to take some Tylenol to help her get better rest.  She does point to the lateral aspect of her right hip as a source of pain but also behind her right knee as being somewhat painful.  She denies any knee effusion.  She is not a diabetic.  HPI  Review of  Systems She currently denies any headache, chest pain, shortness of breath, fever, chills, nausea, vomiting  Objective: Vital Signs: There were no vitals taken for this visit.  Physical Exam She is alert and orient x3 and in no acute distress Ortho Exam Examination of her right hip shows that I can move it fluidly through internal and external rotation with no difficulty at all.  She does have significant pain to palpation of the trochanteric area and IT band of the proximal femur and then down the leg.  Her right knee just shows a little bit of tenderness behind the knee joint itself.  There is some patellofemoral crepitation but the knee moves well and is ligamentously stable. Specialty Comments:  No specialty comments available.  Imaging: No results found.   PMFS History: Patient Active Problem List   Diagnosis Date Noted  . Leg edema 08/19/2018  . Presence of left artificial knee joint 08/14/2016  . Unilateral primary osteoarthritis, left knee 07/31/2016  . Status post total knee replacement, left 07/31/2016  . Abdominal hematoma   . Post-operative state   . Paroxysmal atrial fibrillation (HCC)   . Controlled type 2 diabetes mellitus with diabetic nephropathy ([place])   . Wound infection 01/25/2016  . Hydronephrosis, left 01/25/2016  . Hydronephrosis, right 01/25/2016  . DVT (deep venous thrombosis) ([place]) 01/25/2016  . Atrial fibrillation ([place]) 01/25/2016  . Diabetes mellitus with complication ([place])   . Wound infection after surgery, initial encounter   . Varicose veins of lower extremities with complications 99991111  . Pain and swelling of left lower leg 10/27/2013  . Pain of left lower extremity 10/27/2013  . Gastroenteritis, acute 09/08/2013  . Fever, unspecified 09/04/2013  . ASD (atrial septal defect) 08/13/2013  . Pulmonary embolism ([place]) 06/22/2013  . Dyspnea 06/11/2013  . Anterior slip repaired Oct 2012 01/18/2011  . GI bleed 12/18/2010  . Diabetes mellitus  ([place]) 12/18/2010  . Varicose veins of lower extremities with other complications 99991111  . Diabetes mellitus   . HIP PAIN, RIGHT 03/06/2010   Past Medical History:  Diagnosis Date  . Anemia   . Arthritis   . Atrial fibrillation ([place])   . Bleeding ulcer 12-2010  hospitalized for 1 week  . Blood transfusion   . Blood transfusion without reported diagnosis    2011-2 units transfused, bleeding ulcer  . Bruises easily   . CHF (congestive heart failure) ([place])    with PE resolved after   . Constipation   . Difficulty in swallowing 03-24-13   no longer a problem  . DVT (deep venous thrombosis) ([place]) 06-2013   right leg  . Dysrhythmia     Hx of A fib   . GERD (gastroesophageal reflux disease)   . Hyperlipidemia   . Leg swelling 03-24-13   not a problem any longer  .  Peripheral vascular disease ([place])   . Pulmonary embolus ([place]) 06-2013  . Reflux   . Shortness of breath    with PE  . Vomiting 03-24-13   problem resolved after lap band revision    Family History  Problem Relation Age of Onset  . Heart disease Father   . Kidney disease Father   . Peripheral vascular disease Father   . Heart attack Father        40  . Diabetes Mother   . Arthritis Mother     Past Surgical History:  Procedure Laterality Date  . APPENDECTOMY    . CARPEL TUNNEL BILATEREAL    . CHOLECYSTECTOMY     5'12  . COLONOSCOPY N/A 04/10/2013   Procedure: COLONOSCOPY;  Surgeon: Beryle Beams, MD;  Location: [facility];  Service: Endoscopy;  Laterality: N/A;  . DIAGNOSTIC LAPAROSCOPY    . ENDOVENOUS ABLATION SAPHENOUS VEIN W/ LASER  06-2008  . ENDOVENOUS ABLATION SAPHENOUS VEIN W/ LASER Left 12-09-2013   EVLA LEFT GREATER SAPHENOUS VEIN BY TODD EARLY MD  . ESOPHAGOGASTRODUODENOSCOPY  12/18/2010   Procedure: ESOPHAGOGASTRODUODENOSCOPY (EGD);  Surgeon: Gatha Mayer, MD;  Location: [facility];  Service: Endoscopy;  Laterality: N/A;  . JOINT REPLACEMENT    . LAPAROSCOPIC GASTRIC BANDING  01-05-2008   . LAPAROSCOPIC GASTRIC BANDING  12/18/2010   01/04/2009  . SALPHINGOOOPHORECTOMY Right    cyst removal  . TONSILLECTOMY    . TOTAL KNEE ARTHROPLASTY Left 07/31/2016   Procedure: LEFT TOTAL KNEE ARTHROPLASTY;  Surgeon: [name], MD;  Location: [facility];  Service: Orthopedics;  Laterality: Left;  . WEDGE RESECTION OF OVARY  LEFT OVARY    Social History   Occupational History  . Occupation: Programmer, multimedia: [employer]  Tobacco Use  . Smoking status: Never Smoker  . Smokeless tobacco: Never Used  Substance and Sexual Activity  . Alcohol use: No    Alcohol/week: 0.0 standard drinks  . Drug use: No  . Sexual activity: Yes    Partners: Male    Birth control/protection: None

## 2018-10-23 NOTE — Progress Notes (Signed)
Pharmacy Note  Subjective: Patient presents today to the [facility] to see Dr. Estanislado Pandy.  Patient seen by the pharmacist for counseling on hydroxychloroquine rheumatoid arthritis.  She has tolerated Plaquenil well in the past.  Objective: CMP     Component Value Date/Time   NA 142 07/21/2018 0916   K 3.5 07/21/2018 0916   CL 100 07/21/2018 0916   CO2 23 07/21/2018 0916   GLUCOSE 115 (H) 07/21/2018 0916   GLUCOSE 96 08/30/2016 1811   BUN 7 07/21/2018 0916   CREATININE 0.67 07/21/2018 0916   CREATININE 0.66 06/06/2016 1227   CALCIUM 8.8 07/21/2018 0916   PROT 6.8 08/30/2016 1811   ALBUMIN 3.8 08/30/2016 1811   AST 18 08/30/2016 1811   ALT 12 (L) 08/30/2016 1811   ALKPHOS 69 08/30/2016 1811   BILITOT 0.4 08/30/2016 1811   GFRNONAA 97 07/21/2018 0916   GFRNONAA >89 06/06/2016 1227   GFRAA 111 07/21/2018 0916   GFRAA >89 06/06/2016 1227    CBC    Component Value Date/Time   WBC 4.7 07/21/2018 0916   WBC 4.1 08/30/2016 1811   RBC 5.27 07/21/2018 0916   RBC 4.58 08/30/2016 1811   HGB 12.9 07/21/2018 0916   HCT 41.0 07/21/2018 0916   PLT 205 07/21/2018 0916   MCV 78 (L) 07/21/2018 0916   MCH 24.5 (L) 07/21/2018 0916   MCH 21.0 (L) 08/30/2016 1811   MCHC 31.5 07/21/2018 0916   MCHC 28.7 (L) 08/30/2016 1811   RDW 16.9 (H) 07/21/2018 0916   LYMPHSABS 1.6 08/30/2016 1811   MONOABS 0.2 08/30/2016 1811   EOSABS 0.2 08/30/2016 1811   BASOSABS 0.0 08/30/2016 1811    Assessment/Plan: Patient was counseled on the purpose, proper use, and adverse effects of hydroxychloroquine including nausea/diarrhea, skin rash, headaches, and sun sensitivity.  Discussed importance of annual eye exams while on hydroxychloroquine to monitor to ocular toxicity and discussed importance of frequent laboratory monitoring.  Provided patient with eye exam form for baseline ophthalmologic exam and standing lab instructions.  Provided patient with educational materials on hydroxychloroquine  and answered all questions.  Patient consented to hydroxychloroquine.  Will upload consent in the media tab.     Dose will be Plaquenil 200 mg twice daily based on weight of 146.5 kg, height of 5'6", and eGFR 97.   All questions encouraged and answered.  Mariella Saa, PharmD, [place], [place] Clinical Specialty Pharmacist [phone]  10/23/2018 2:30 PM

## 2018-10-24 ENCOUNTER — Telehealth: Payer: Self-pay | Admitting: Rheumatology

## 2018-10-24 NOTE — Telephone Encounter (Signed)
Patient left a message stating Dr. Estanislado Pandy discussed restarting her on Plaquenil at visit yesterday. Per patient, rx has not been called in. Please [facility] to [facility] [address] in [place].

## 2018-10-27 NOTE — Telephone Encounter (Signed)
Based off office note on 10/23/18 patient was to start on PLQ pending lab results. No labs were ordered. Please advise.

## 2018-10-27 NOTE — Telephone Encounter (Signed)
Patient advised prescription was sent the day she had her office visit. Patient states she will contact the pharmacy.

## 2018-10-27 NOTE — Telephone Encounter (Signed)
Error in note.  Prescription was [facility] to [facility] [place] on 10/23/2018 during office visit.

## 2018-11-03 ENCOUNTER — Other Ambulatory Visit: Payer: Self-pay | Admitting: Rheumatology

## 2018-11-03 DIAGNOSIS — M0579 Rheumatoid arthritis with rheumatoid factor of multiple sites without organ or systems involvement: Secondary | ICD-10-CM

## 2018-11-03 NOTE — Telephone Encounter (Signed)
Last Visit: 10/23/18 Next Visit: 11/20/18 Labs: 07/21/18 MVC 78, MCH 24.5, RDW 16.9 PLQ eye exam: 10/31/17 Normal.  Okay to refill per Dr. Estanislado Pandy

## 2018-11-06 NOTE — Progress Notes (Signed)
Office Visit Note  Patient: [name]             Date of Birth: [birth year]           MRN: [id number]             PCP: Mateo Flow, MD Referring: Mateo Flow, MD Visit Date: 11/20/2018 Occupation: @GUAROCC @  Subjective:  Bilateral knee joint pain   History of Present Illness: [name] is a 58 y.o. female with history of seropositive rheumatoid arthritis.  She is taking Plaquenil 200 mg 1 tablet by mouth twice daily.  She restarted on PLQ on 10/23/18.  She is been tolerating Plaquenil without any side effects.  She has not missed any doses of Plaquenil she has noticed significant improvement since restarting on PLQ.  She states the pain and stiffness in both hands has been improving.  She states the joint pain and stiffness prior to restarting on Plaquenil was not enough time that subsided to about a 3 out of 10 now.  She continues to have pain in bilateral knee joints but denies any joint swelling.  She states the swelling in bilateral ankles has been improving.  She has a baseline Plaquenil eye exam scheduled on 12/18/2018.    Activities of Daily Living:  Patient reports morning stiffness for 30  minutes.   Patient Denies nocturnal pain.  Difficulty dressing/grooming: Denies Difficulty climbing stairs: Reports Difficulty getting out of chair: Reports Difficulty using hands for taps, buttons, cutlery, and/or writing: Denies  Review of Systems  Constitutional: Positive for fatigue.  HENT: Positive for mouth sores. Negative for mouth dryness and nose dryness.   Eyes: Negative for pain, visual disturbance and dryness.  Respiratory: Negative for cough, hemoptysis, shortness of breath and difficulty breathing.   Cardiovascular: Negative for chest pain, palpitations, hypertension and swelling in legs/feet.  Gastrointestinal: Negative for blood in stool, constipation and diarrhea.  Endocrine: Negative for increased urination.  Genitourinary: Negative for painful  urination.  Musculoskeletal: Positive for arthralgias, joint pain, joint swelling and morning stiffness. Negative for myalgias, muscle weakness, muscle tenderness and myalgias.  Skin: Negative for color change, pallor, rash, hair loss, nodules/bumps, skin tightness, ulcers and sensitivity to sunlight.  Allergic/Immunologic: Negative for susceptible to infections.  Neurological: Negative for dizziness, numbness, headaches and weakness.  Hematological: Negative for swollen glands.  Psychiatric/Behavioral: Negative for depressed mood and sleep disturbance. The patient is not nervous/anxious.     PMFS History:  Patient Active Problem List   Diagnosis Date Noted   Leg edema 08/19/2018   Presence of left artificial knee joint 08/14/2016   Unilateral primary osteoarthritis, left knee 07/31/2016   Status post total knee replacement, left 07/31/2016   Abdominal hematoma    Post-operative state    Paroxysmal atrial fibrillation ([place])    Controlled type 2 diabetes mellitus with diabetic nephropathy ([place])    Wound infection 01/25/2016   Hydronephrosis, left 01/25/2016   Hydronephrosis, right 01/25/2016   DVT (deep venous thrombosis) ([place]) 01/25/2016   Atrial fibrillation ([place]) 01/25/2016   Diabetes mellitus with complication ([place])    Wound infection after surgery, initial encounter    Varicose veins of lower extremities with complications 99991111   Pain and swelling of left lower leg 10/27/2013   Pain of left lower extremity 10/27/2013   Gastroenteritis, acute 09/08/2013   Fever, unspecified 09/04/2013   ASD (atrial septal defect) 08/13/2013   Pulmonary embolism ([place]) 06/22/2013   Dyspnea 06/11/2013   Anterior slip repaired Oct  2012 01/18/2011   GI bleed 12/18/2010   Diabetes mellitus ([place]) 12/18/2010   Varicose veins of lower extremities with other complications 99991111   Diabetes mellitus    HIP PAIN, RIGHT 03/06/2010    Past Medical History:    Diagnosis Date   Anemia    Arthritis    Atrial fibrillation ([place])    Bleeding ulcer 12-2010  hospitalized for 1 week   Blood transfusion    Blood transfusion without reported diagnosis    2011-2 units transfused, bleeding ulcer   Bruises easily    CHF (congestive heart failure) ([place])    with PE resolved after    Constipation    Difficulty in swallowing 03-24-13   no longer a problem   DVT (deep venous thrombosis) ([place]) 06-2013   right leg   Dysrhythmia     Hx of A fib    GERD (gastroesophageal reflux disease)    Hyperlipidemia    Leg swelling 03-24-13   not a problem any longer   Peripheral vascular disease (HCC)    Pulmonary embolus (HCC) 06-2013   Reflux    Shortness of breath    with PE   Vomiting 03-24-13   problem resolved after lap band revision    Family History  Problem Relation Age of Onset   Heart disease Father    Kidney disease Father    Peripheral vascular disease Father    Heart attack Father        62   Diabetes Mother    Arthritis Mother    Past Surgical History:  Procedure Laterality Date   APPENDECTOMY     [place]     CHOLECYSTECTOMY     [id number]   COLONOSCOPY N/A 04/10/2013   Procedure: COLONOSCOPY;  Surgeon: Beryle Beams, MD;  [facility];  Service: Endoscopy;  Laterality: N/A;   DIAGNOSTIC LAPAROSCOPY     ENDOVENOUS ABLATION SAPHENOUS VEIN W/ LASER  06-2008   ENDOVENOUS ABLATION SAPHENOUS VEIN W/ LASER Left 12-09-2013   EVLA LEFT GREATER SAPHENOUS VEIN BY TODD EARLY MD   ESOPHAGOGASTRODUODENOSCOPY  12/18/2010   Procedure: ESOPHAGOGASTRODUODENOSCOPY (EGD);  Surgeon: Gatha Mayer, MD;  Location: [facility];  Service: Endoscopy;  Laterality: N/A;   JOINT REPLACEMENT     LAPAROSCOPIC GASTRIC BANDING  01-05-2008   LAPAROSCOPIC GASTRIC BANDING  12/18/2010   01/04/2009   SALPHINGOOOPHORECTOMY Right    cyst removal   TONSILLECTOMY     TOTAL KNEE ARTHROPLASTY Left 07/31/2016    Procedure: LEFT TOTAL KNEE ARTHROPLASTY;  Surgeon: [name], MD;  Location: [facility];  Service: Orthopedics;  Laterality: Left;   WEDGE RESECTION OF OVARY  LEFT OVARY    Social History   Social History Narrative   Not on file   Immunization History  Administered Date(s) Administered   Influenza Split 10/06/2012   Influenza,inj,Quad PF,6+ Mos 01/30/2016   Pneumococcal Conjugate-13 11/29/2015     Objective: Vital Signs: BP 134/82 (BP Location: Left Arm, Patient Position: Sitting, Cuff Size: Large)    Pulse 97    Resp 12    Ht 5\' 6"  (1.676 m)    Wt (!) 324 lb 6.4 oz (147.1 kg)    BMI 52.36 kg/m    Physical Exam Vitals signs and nursing note reviewed.  Constitutional:      Appearance: She is well-developed.  HENT:     Head: Normocephalic and atraumatic.  Eyes:     Conjunctiva/sclera: Conjunctivae normal.  Neck:     Musculoskeletal:  Normal range of motion.  Cardiovascular:     Rate and Rhythm: Normal rate and regular rhythm.     Heart sounds: Normal heart sounds.  Pulmonary:     Effort: Pulmonary effort is normal.     Breath sounds: Normal breath sounds.  Abdominal:     General: Bowel sounds are normal.     Palpations: Abdomen is soft.  Lymphadenopathy:     Cervical: No cervical adenopathy.  Skin:    General: Skin is warm and dry.     Capillary Refill: Capillary refill takes less than 2 seconds.  Neurological:     Mental Status: She is alert and oriented to person, place, and time.  Psychiatric:        Behavior: Behavior normal.      Musculoskeletal Exam: C-spine, thoracic spine, and lumbar spine good ROM.  No midline spinal tenderness.  No SI joint tenderness.  Shoulder joints, elbow joints, wrist joints, MCPs, PIPs, and DIPs good ROM with no synovitis.  Hip joints, knee joints, ankle joints, MTPs, PIPs, and DIPs good ROM with no synovitis. Tenderness of both knee joints.  No warmth or effusion of knee joints.  Pedal edema bilaterally.    CDAI  Exam: CDAI Score: 0.6  Patient Global: 3 mm; Provider Global: 3 mm Swollen: 0 ; Tender: 0  Joint Exam   No joint exam has been documented for this visit   There is currently no information documented on the homunculus. Go to the Rheumatology activity and complete the homunculus joint exam.  Investigation: No additional findings.  Imaging: No results found.  Recent Labs: Lab Results  Component Value Date   WBC 4.7 07/21/2018   HGB 12.9 07/21/2018   PLT 205 07/21/2018   NA 142 07/21/2018   K 3.5 07/21/2018   CL 100 07/21/2018   CO2 23 07/21/2018   GLUCOSE 115 (H) 07/21/2018   BUN 7 07/21/2018   CREATININE 0.67 07/21/2018   BILITOT 0.4 08/30/2016   ALKPHOS 69 08/30/2016   AST 18 08/30/2016   ALT 12 (L) 08/30/2016   PROT 6.8 08/30/2016   ALBUMIN 3.8 08/30/2016   CALCIUM 8.8 07/21/2018   GFRAA 111 07/21/2018    Speciality Comments: PLQ eye exam: 10/31/17 Normal. [facility]. Follow up in 1 year.  Procedures:  No procedures performed Allergies: Contrast media [iodinated diagnostic agents], Penicillins, and Ancef [cefazolin sodium]    Assessment / Plan:     Visit Diagnoses: Rheumatoid arthritis with rheumatoid factor of multiple sites without organ or systems involvement ([place]) - positive rheumatoid factor, negative ANA, negative CCP: She has no synovitis on exam today.  She has noticed significant improvement since restarting on Plaquenil on 10/23/2018.  She is been taking Plaquenil 200 mg 1 tablet by mouth twice daily.  She has been tolerating Plaquenil without any side effects.  The pain and stiffness in both hands and both ankle joints have started to subside.  She states that prior to restarting on Plaquenil her pain and stiffness was an 8 out of 10 and is subsided to a 3 out of 10.  She will continue taking Plaquenil as prescribed.  She does not need any refills at this time.  She has an upcoming Plaquenil eye exam scheduled on 12/18/2018.  We will obtain lab work today,  3 months, then every 5 months.  She was advised to notify us if she develops increased joint pain or joint swelling.  She will follow-up in the office in 3 months.  High risk medication use -Plaquenil 200 mg 1 tablet twice daily resumed on 10/23/2018.  Last Plaquenil eye exam normal 10/31/2017.  She has a Plaquenil eye exam scheduled on 12/18/2018.  Most recent CBC/BMP within normal limits on 07/21/2018.  Most recent CMP showed decreased ALT on 08/30/2016.  Due for CBC/CMP today.   - Plan: CBC with Differential/Platelet, COMPLETE METABOLIC PANEL WITH GFR  History of arthroplasty of left knee - Dr. Ninfa Linden, 2018: Doing well.  She has intermittent discomfort in the left knee joint.  No warmth or effusion noted on exam.   Other medical conditions are listed as follows:   ASD (atrial septal defect)  Controlled type 2 diabetes mellitus with diabetic nephropathy, unspecified whether long term insulin use (HCC)  Paroxysmal atrial fibrillation (HCC)  Hydronephrosis, right  Hydronephrosis, left  History of DVT (deep vein thrombosis)  Orders: Orders Placed This Encounter  Procedures   CBC with Differential/Platelet   COMPLETE METABOLIC PANEL WITH GFR   No orders of the defined types were placed in this encounter.   Follow-Up Instructions: Return in about 3 months (around 02/20/2019) for Rheumatoid arthritis.   Ofilia Neas, PA-C  Note - This record has been created using Dragon software.  Chart creation errors have been sought, but may not always  have been located. Such creation errors do not reflect on  the standard of medical care.

## 2018-11-20 ENCOUNTER — Ambulatory Visit (INDEPENDENT_AMBULATORY_CARE_PROVIDER_SITE_OTHER): Payer: 59 | Admitting: Physician Assistant

## 2018-11-20 ENCOUNTER — Other Ambulatory Visit: Payer: Self-pay

## 2018-11-20 ENCOUNTER — Encounter: Payer: Self-pay | Admitting: Physician Assistant

## 2018-11-20 VITALS — BP 134/82 | HR 97 | Resp 12 | Ht 66.0 in | Wt 324.4 lb

## 2018-11-20 DIAGNOSIS — Z96652 Presence of left artificial knee joint: Secondary | ICD-10-CM

## 2018-11-20 DIAGNOSIS — Z79899 Other long term (current) drug therapy: Secondary | ICD-10-CM | POA: Diagnosis not present

## 2018-11-20 DIAGNOSIS — M0579 Rheumatoid arthritis with rheumatoid factor of multiple sites without organ or systems involvement: Secondary | ICD-10-CM | POA: Diagnosis not present

## 2018-11-20 DIAGNOSIS — E1121 Type 2 diabetes mellitus with diabetic nephropathy: Secondary | ICD-10-CM

## 2018-11-20 DIAGNOSIS — Q211 Atrial septal defect, unspecified: Secondary | ICD-10-CM

## 2018-11-20 DIAGNOSIS — Z86718 Personal history of other venous thrombosis and embolism: Secondary | ICD-10-CM

## 2018-11-20 DIAGNOSIS — I480 Paroxysmal atrial fibrillation: Secondary | ICD-10-CM

## 2018-11-20 DIAGNOSIS — N1330 Unspecified hydronephrosis: Secondary | ICD-10-CM

## 2018-11-20 NOTE — Patient Instructions (Signed)
Standing Labs We placed an order today for your standing lab work.    Please come back and get your standing labs in 3 months then every 5 months   We have open lab daily Monday through Thursday from 8:30-12:30 PM and 1:30-4:30 PM and Friday from 8:30-12:30 PM and 1:30-4:00 PM at the office of Dr. Bo Merino.   You may experience shorter wait times on Monday and Friday afternoons. The office is located at [address], [place], [place], [place] [address] No appointment is necessary.   Labs are drawn by [facility].  You may receive a bill from [facility] for your lab work.  If you wish to have your labs drawn at another location, please call the office 24 hours in advance to send orders.  If you have any questions regarding directions or hours of operation,  please call [phone].   Just as a reminder please drink plenty of water prior to coming for your lab work. Thanks!

## 2018-11-21 LAB — CBC WITH DIFFERENTIAL/PLATELET
Absolute Monocytes: 323 cells/uL (ref 200–950)
Basophils Absolute: 49 cells/uL (ref 0–200)
Basophils Relative: 1 %
Eosinophils Absolute: 294 cells/uL (ref 15–500)
Eosinophils Relative: 6 %
HCT: 41.2 % (ref 35.0–45.0)
Hemoglobin: 12.7 g/dL (ref 11.7–15.5)
Lymphs Abs: 1593 cells/uL (ref 850–3900)
MCH: 24.1 pg — ABNORMAL LOW (ref 27.0–33.0)
MCHC: 30.8 g/dL — ABNORMAL LOW (ref 32.0–36.0)
MCV: 78 fL — ABNORMAL LOW (ref 80.0–100.0)
MPV: 10.3 fL (ref 7.5–12.5)
Monocytes Relative: 6.6 %
Neutro Abs: 2641 cells/uL (ref 1500–7800)
Neutrophils Relative %: 53.9 %
Platelets: 192 10*3/uL (ref 140–400)
RBC: 5.28 10*6/uL — ABNORMAL HIGH (ref 3.80–5.10)
RDW: 17.1 % — ABNORMAL HIGH (ref 11.0–15.0)
Total Lymphocyte: 32.5 %
WBC: 4.9 10*3/uL (ref 3.8–10.8)

## 2018-11-21 LAB — COMPLETE METABOLIC PANEL WITH GFR
AG Ratio: 1.5 (calc) (ref 1.0–2.5)
ALT: 33 U/L — ABNORMAL HIGH (ref 6–29)
AST: 38 U/L — ABNORMAL HIGH (ref 10–35)
Albumin: 4 g/dL (ref 3.6–5.1)
Alkaline phosphatase (APISO): 82 U/L (ref 37–153)
BUN: 12 mg/dL (ref 7–25)
CO2: 23 mmol/L (ref 20–32)
Calcium: 9.3 mg/dL (ref 8.6–10.4)
Chloride: 108 mmol/L (ref 98–110)
Creat: 0.71 mg/dL (ref 0.50–1.05)
GFR, Est African American: 108 mL/min/{1.73_m2} (ref >=60)
GFR, Est Non African American: 93 mL/min/{1.73_m2} (ref >=60)
Globulin: 2.7 g/dL (calc) (ref 1.9–3.7)
Glucose, Bld: 85 mg/dL (ref 65–99)
Potassium: 4.2 mmol/L (ref 3.5–5.3)
Sodium: 143 mmol/L (ref 135–146)
Total Bilirubin: 0.3 mg/dL (ref 0.2–1.2)
Total Protein: 6.7 g/dL (ref 6.1–8.1)

## 2018-11-21 NOTE — Progress Notes (Signed)
RBC count is mildly elevated.   MCV and MCH are mildly low but stable.  LFTs are mildly elevated.  Please advise patient to avoid taking NSAIDs, tylenol, and alcohol.  We will continue to monitor lab work.

## 2018-12-08 ENCOUNTER — Ambulatory Visit (INDEPENDENT_AMBULATORY_CARE_PROVIDER_SITE_OTHER): Payer: 59 | Admitting: Orthopaedic Surgery

## 2018-12-08 ENCOUNTER — Encounter: Payer: Self-pay | Admitting: Orthopaedic Surgery

## 2018-12-08 ENCOUNTER — Other Ambulatory Visit: Payer: Self-pay

## 2018-12-08 DIAGNOSIS — M7061 Trochanteric bursitis, right hip: Secondary | ICD-10-CM

## 2018-12-08 DIAGNOSIS — M25561 Pain in right knee: Secondary | ICD-10-CM | POA: Diagnosis not present

## 2018-12-08 NOTE — Progress Notes (Signed)
Marleni comes in today for continued evaluation treatment of right hip and right knee pain.  She is someone with a BMI of over 40.  She does have trochanteric bursitis of her right hip.  An injection in that hip helped greatly for about 2 weeks.  We had recommended outpatient physical therapy but she has not been there yet and has not scheduled any therapy.  She is also pointing to the quad tendon area near the patella as a source of acute pain in her right knee.  On examination of her right knee there is no effusion.  She has good range of motion at knee and she can hold her knee fully extended but she does have some pain over the quad tendon itself.  There is no evidence of rupture.  There is pain to palpation of the trochanteric area of her right hip but good range of motion of the hip in general.  I would like to send her to outpatient physical therapy and I gave her prescription for this to work on her right hip and her right knee.  She agrees to try this as well.  All question concerns were answered and addressed.  We can see her back in about 2 months to see how she is doing overall.

## 2018-12-14 ENCOUNTER — Other Ambulatory Visit: Payer: Self-pay

## 2018-12-14 ENCOUNTER — Emergency Department (HOSPITAL_COMMUNITY): Payer: No Typology Code available for payment source

## 2018-12-14 ENCOUNTER — Emergency Department (HOSPITAL_COMMUNITY)
Admission: EM | Admit: 2018-12-14 | Discharge: 2018-12-14 | Disposition: A | Discharge status: Home / Self Care | Payer: No Typology Code available for payment source | Attending: Emergency Medicine | Admitting: Emergency Medicine

## 2018-12-14 ENCOUNTER — Encounter (HOSPITAL_COMMUNITY): Payer: Self-pay

## 2018-12-14 DIAGNOSIS — Z79899 Other long term (current) drug therapy: Secondary | ICD-10-CM | POA: Insufficient documentation

## 2018-12-14 DIAGNOSIS — Z7901 Long term (current) use of anticoagulants: Secondary | ICD-10-CM | POA: Diagnosis not present

## 2018-12-14 DIAGNOSIS — K5792 Diverticulitis of intestine, part unspecified, without perforation or abscess without bleeding: Secondary | ICD-10-CM | POA: Diagnosis not present

## 2018-12-14 DIAGNOSIS — I509 Heart failure, unspecified: Secondary | ICD-10-CM | POA: Diagnosis not present

## 2018-12-14 DIAGNOSIS — E119 Type 2 diabetes mellitus without complications: Secondary | ICD-10-CM | POA: Insufficient documentation

## 2018-12-14 DIAGNOSIS — R1084 Generalized abdominal pain: Secondary | ICD-10-CM | POA: Diagnosis present

## 2018-12-14 LAB — COMPREHENSIVE METABOLIC PANEL
ALT: 32 U/L (ref 0–44)
AST: 35 U/L (ref 15–41)
Albumin: 3.4 g/dL — ABNORMAL LOW (ref 3.5–5.0)
Alkaline Phosphatase: 58 U/L (ref 38–126)
Anion gap: 12 (ref 5–15)
BUN: 6 mg/dL (ref 6–20)
CO2: 22 mmol/L (ref 22–32)
Calcium: 8.7 mg/dL — ABNORMAL LOW (ref 8.9–10.3)
Chloride: 102 mmol/L (ref 98–111)
Creatinine, Ser: 0.67 mg/dL (ref 0.44–1.00)
GFR calc Af Amer: >60 mL/min (ref >60)
GFR calc non Af Amer: >60 mL/min (ref >60)
Glucose, Bld: 121 mg/dL — ABNORMAL HIGH (ref 70–99)
Potassium: 4.1 mmol/L (ref 3.5–5.1)
Sodium: 136 mmol/L (ref 135–145)
Total Bilirubin: 1.3 mg/dL — ABNORMAL HIGH (ref 0.3–1.2)
Total Protein: 6.9 g/dL (ref 6.5–8.1)

## 2018-12-14 LAB — CBC WITH DIFFERENTIAL/PLATELET
Abs Immature Granulocytes: 0.05 10*3/uL (ref 0.00–0.07)
Basophils Absolute: 0.1 10*3/uL (ref 0.0–0.1)
Basophils Relative: 1 %
Eosinophils Absolute: 0.1 10*3/uL (ref 0.0–0.5)
Eosinophils Relative: 1 %
HCT: 43 % (ref 36.0–46.0)
Hemoglobin: 13.2 g/dL (ref 12.0–15.0)
Immature Granulocytes: 1 %
Lymphocytes Relative: 13 %
Lymphs Abs: 1.3 10*3/uL (ref 0.7–4.0)
MCH: 25.3 pg — ABNORMAL LOW (ref 26.0–34.0)
MCHC: 30.7 g/dL (ref 30.0–36.0)
MCV: 82.5 fL (ref 80.0–100.0)
Monocytes Absolute: 0.8 10*3/uL (ref 0.1–1.0)
Monocytes Relative: 8 %
Neutro Abs: 7.8 10*3/uL — ABNORMAL HIGH (ref 1.7–7.7)
Neutrophils Relative %: 76 %
Platelets: 185 10*3/uL (ref 150–400)
RBC: 5.21 MIL/uL — ABNORMAL HIGH (ref 3.87–5.11)
RDW: 18.7 % — ABNORMAL HIGH (ref 11.5–15.5)
WBC: 10.1 10*3/uL (ref 4.0–10.5)
nRBC: 0 % (ref 0.0–0.2)

## 2018-12-14 LAB — LIPASE, BLOOD: Lipase: 16 U/L (ref 11–51)

## 2018-12-14 MED ORDER — METRONIDAZOLE 500 MG PO TABS: 500.0000 mg | ORAL_TABLET | Freq: Three times a day (TID) | ORAL | 0 refills | Status: DC

## 2018-12-14 MED ORDER — HYDROCODONE-ACETAMINOPHEN 5-325 MG PO TABS: 1.0000 | ORAL_TABLET | Freq: Four times a day (QID) | ORAL | 0 refills | Status: DC | PRN

## 2018-12-14 MED ORDER — METRONIDAZOLE 500 MG PO TABS
500.0000 mg | ORAL_TABLET | Freq: Once | ORAL | Status: AC
  Administered 2018-12-14: 500 mg via ORAL
  Filled 2018-12-14: qty 1

## 2018-12-14 MED ORDER — FENTANYL CITRATE (PF) 100 MCG/2ML IJ SOLN
25.0000 ug | Freq: Once | INTRAMUSCULAR | Status: AC
  Administered 2018-12-14: 25 ug via INTRAVENOUS
  Filled 2018-12-14: qty 2

## 2018-12-14 MED ORDER — ONDANSETRON HCL 4 MG/2ML IJ SOLN
4.0000 mg | Freq: Once | INTRAMUSCULAR | Status: AC
  Administered 2018-12-14: 4 mg via INTRAVENOUS
  Filled 2018-12-14: qty 2

## 2018-12-14 MED ORDER — CIPROFLOXACIN HCL 500 MG PO TABS: 500.0000 mg | ORAL_TABLET | Freq: Two times a day (BID) | ORAL | 0 refills | Status: DC

## 2018-12-14 MED ORDER — SODIUM CHLORIDE 0.9 % IV BOLUS
1000.0000 mL | Freq: Once | INTRAVENOUS | Status: AC
  Administered 2018-12-14: 1000 mL via INTRAVENOUS

## 2018-12-14 MED ORDER — CIPROFLOXACIN HCL 500 MG PO TABS
500.0000 mg | ORAL_TABLET | Freq: Once | ORAL | Status: AC
  Administered 2018-12-14: 500 mg via ORAL
  Filled 2018-12-14: qty 1

## 2018-12-14 NOTE — ED Triage Notes (Signed)
Patient complains of generalized abdominal pain for several days with increased pain, no associated symptoms.

## 2018-12-14 NOTE — ED Notes (Signed)
Patient verbalizes understanding of discharge instructions. Opportunity for questioning and answers were provided. Armband removed by staff, pt discharged from ED.  

## 2018-12-14 NOTE — ED Provider Notes (Signed)
[facility] EMERGENCY DEPARTMENT Provider Note   CSN: [id number] Arrival date & time: 12/14/18  0849     History   Chief Complaint Chief Complaint  Patient presents with   Abdominal Pain    HPI [name] is a 58 y.o. female.     The history is provided by the patient.  Abdominal Pain Pain location:  Generalized Pain quality: aching, cramping and gnawing   Pain radiates to:  Does not radiate Pain severity:  Moderate Onset quality:  Gradual Duration:  2 days Timing:  Constant Progression:  Worsening Chronicity:  New Context comment:  Started spontaneously 2 days ago.  no inciting incident Relieved by:  Nothing Worsened by:  Eating Ineffective treatments: tried taking a laxative but did not help. Associated symptoms: anorexia, constipation and nausea   Associated symptoms: no chest pain, no chills, no cough, no diarrhea, no dysuria, no fever, no hematemesis, no hematochezia, no melena, no shortness of breath, no vaginal discharge and no vomiting   Associated symptoms comment:  No BM in 2 days.  No passing flatus.  No urinary complaints, fever, cough or SOB.  NO chest pain.  No surgery in the last year but past hx of lapband and appendectomy Risk factors: multiple surgeries and obesity     Past Medical History:  Diagnosis Date   Anemia    Arthritis    Atrial fibrillation ([place])    Bleeding ulcer 12-2010  hospitalized for 1 week   Blood transfusion    Blood transfusion without reported diagnosis    2011-2 units transfused, bleeding ulcer   Bruises easily    CHF (congestive heart failure) ([place])    with PE resolved after    Constipation    Difficulty in swallowing 03-24-13   no longer a problem   DVT (deep venous thrombosis) ([place]) 06-2013   right leg   Dysrhythmia     Hx of A fib    GERD (gastroesophageal reflux disease)    Hyperlipidemia    Leg swelling 03-24-13   not a problem any longer   Peripheral vascular disease  ([place])    Pulmonary embolus ([place]) [id number]   Reflux    Shortness of breath    with PE   Vomiting 03-24-13   problem resolved after lap band revision    Patient Active Problem List   Diagnosis Date Noted   Leg edema 08/19/2018   Presence of left artificial knee joint 08/14/2016   Unilateral primary osteoarthritis, left knee 07/31/2016   Status post total knee replacement, left 07/31/2016   Abdominal hematoma    Post-operative state    Paroxysmal atrial fibrillation ([place])    Controlled type 2 diabetes mellitus with diabetic nephropathy ([place])    Wound infection 01/25/2016   Hydronephrosis, left 01/25/2016   Hydronephrosis, right 01/25/2016   DVT (deep venous thrombosis) ([place]) 01/25/2016   Atrial fibrillation ([place]) 01/25/2016   Diabetes mellitus with complication ([place])    Wound infection after surgery, initial encounter    Varicose veins of lower extremities with complications [id number]   Pain and swelling of left lower leg 10/27/2013   Pain of left lower extremity 10/27/2013   Gastroenteritis, acute 09/08/2013   Fever, unspecified 09/04/2013   ASD (atrial septal defect) 08/13/2013   Pulmonary embolism ([place]) 06/22/2013   Dyspnea 06/11/2013   Anterior slip repaired Oct 2012 01/18/2011   GI bleed 12/18/2010   Diabetes mellitus ([place]) 12/18/2010   Varicose veins of lower extremities with other complications [id number]  Diabetes mellitus    HIP PAIN, RIGHT 03/06/2010    Past Surgical History:  Procedure Laterality Date   APPENDECTOMY     Claudette Head     CHOLECYSTECTOMY     [id number]   COLONOSCOPY N/A 04/10/2013   Procedure: COLONOSCOPY;  Surgeon: Beryle Beams, MD;  Location: [facility];  Service: Endoscopy;  Laterality: N/A;   DIAGNOSTIC LAPAROSCOPY     ENDOVENOUS ABLATION SAPHENOUS VEIN W/ LASER  06-2008   ENDOVENOUS ABLATION SAPHENOUS VEIN W/ LASER Left 12-09-2013   EVLA LEFT GREATER SAPHENOUS VEIN BY TODD EARLY MD    ESOPHAGOGASTRODUODENOSCOPY  12/18/2010   Procedure: ESOPHAGOGASTRODUODENOSCOPY (EGD);  Surgeon: Gatha Mayer, MD;  Location: [facility];  Service: Endoscopy;  Laterality: N/A;   JOINT REPLACEMENT     LAPAROSCOPIC GASTRIC BANDING  01-05-2008   LAPAROSCOPIC GASTRIC BANDING  12/18/2010   01/04/2009   SALPHINGOOOPHORECTOMY Right    cyst removal   TONSILLECTOMY     TOTAL KNEE ARTHROPLASTY Left 07/31/2016   Procedure: LEFT TOTAL KNEE ARTHROPLASTY;  Surgeon: [name], MD;  Location: [facility];  Service: Orthopedics;  Laterality: Left;   WEDGE RESECTION OF OVARY  LEFT OVARY      OB History   No obstetric history on file.      Home Medications    Prior to Admission medications   Medication Sig Start Date End Date Taking? Authorizing Provider  acetaminophen (TYLENOL) 325 MG tablet Take 650 mg by mouth every 6 (six) hours as needed for mild pain or moderate pain.    [provider]  Cholecalciferol (VITAMIN D-3) 125 MCG (5000 UT) TABS Take 1 tablet by mouth daily.    [provider]  hydroxychloroquine (PLAQUENIL) 200 MG tablet TAKE 1 TABLET BY MOUTH TWICE A DAY 11/03/18   Bo Merino, MD  metoprolol succinate (TOPROL-XL) 25 MG 24 hr tablet Take 1 tablet (25 mg total) by mouth daily. 07/17/18   Nahser, Wonda Cheng, MD  Multiple Vitamin (MULTIVITAMIN) tablet Take 1 tablet by mouth daily.    [provider]  omeprazole (PRILOSEC) 20 MG capsule Take 20 mg by mouth 2 (two) times daily before a meal.    [provider]  rivaroxaban (XARELTO) 20 MG TABS tablet Take 20 mg by mouth daily.     [provider]  SYMBICORT 80-4.5 MCG/ACT inhaler Inhale 2 puffs into the lungs as needed. 07/24/18   [provider]    Family History Family History  Problem Relation Age of Onset   Heart disease Father    Kidney disease Father    Peripheral vascular disease Father    Heart attack Father        79   Diabetes Mother     Arthritis Mother     Social History Social History   Tobacco Use   Smoking status: Never Smoker   Smokeless tobacco: Never Used  Substance Use Topics   Alcohol use: No    Alcohol/week: 0.0 standard drinks   Drug use: No     Allergies   Contrast media [iodinated diagnostic agents], Penicillins, and Ancef [cefazolin sodium]   Review of Systems Review of Systems  Constitutional: Negative for chills and fever.  Respiratory: Negative for cough and shortness of breath.   Cardiovascular: Negative for chest pain.  Gastrointestinal: Positive for abdominal pain, anorexia, constipation and nausea. Negative for diarrhea, hematemesis, hematochezia, melena and vomiting.  Genitourinary: Negative for dysuria and vaginal discharge.  All other systems reviewed and are negative.  Physical Exam Updated Vital Signs BP (!) 141/68 (BP Location: Right Arm)    Pulse 91    Temp 98.4 F (36.9 C) (Oral)    Resp 16    SpO2 97%   Physical Exam Vitals signs and nursing note reviewed.  Constitutional:      General: She is not in acute distress.    Appearance: She is well-developed. She is obese.  HENT:     Head: Normocephalic and atraumatic.  Eyes:     Pupils: Pupils are equal, round, and reactive to light.  Cardiovascular:     Rate and Rhythm: Normal rate and regular rhythm.     Heart sounds: Normal heart sounds. No murmur. No friction rub.  Pulmonary:     Effort: Pulmonary effort is normal.     Breath sounds: Normal breath sounds. No wheezing or rales.  Abdominal:     General: Bowel sounds are absent. There is no distension.     Palpations: Abdomen is soft.     Tenderness: There is abdominal tenderness in the suprapubic area, left upper quadrant and left lower quadrant. There is no guarding or rebound.     Hernia: No hernia is present.  Musculoskeletal: Normal range of motion.        General: No tenderness.     Comments: No edema  Skin:    General: Skin is warm and dry.      Capillary Refill: Capillary refill takes less than 2 seconds.     Findings: No rash.  Neurological:     General: No focal deficit present.     Mental Status: She is alert and oriented to person, place, and time.     Cranial Nerves: No cranial nerve deficit.  Psychiatric:        Mood and Affect: Mood normal.        Behavior: Behavior normal.      ED Treatments / Results  Labs (all labs ordered are listed, but only abnormal results are displayed) Labs Reviewed  CBC WITH DIFFERENTIAL/PLATELET - Abnormal; Notable for the following components:      Result Value   RBC 5.21 (*)    MCH 25.3 (*)    RDW 18.7 (*)    Neutro Abs 7.8 (*)    All other components within normal limits  COMPREHENSIVE METABOLIC PANEL - Abnormal; Notable for the following components:   Glucose, Bld 121 (*)    Calcium 8.7 (*)    Albumin 3.4 (*)    Total Bilirubin 1.3 (*)    All other components within normal limits  LIPASE, BLOOD    EKG None  Radiology Ct Abdomen Pelvis Wo Contrast  Result Date: 12/14/2018 CLINICAL DATA:  Diffuse abdominal pain EXAM: CT ABDOMEN AND PELVIS WITHOUT CONTRAST TECHNIQUE: Multidetector CT imaging of the abdomen and pelvis was performed following the standard protocol without IV contrast. COMPARISON:  01/25/2016 FINDINGS: Lower chest: Lung bases are clear. Hepatobiliary: Hepatic steatosis. Status post cholecystectomy. No intrahepatic or extrahepatic ductal dilatation. Pancreas: Within normal limits. Spleen: Within normal limits. Adrenals/Urinary Tract: Adrenal glands are within normal limits. 2 mm nonobstructing interpolar left renal calculus (coronal image 107), unchanged. Kidneys are otherwise within normal limits. No hydronephrosis. Bladder is within normal limits. Stomach/Bowel: Status post laparoscopic gastric band. Moderate size gastric pouch above the level of the band (series 3/image 19), unchanged from 2017. No evidence of bowel obstruction. Appendix is not discretely visualized.  Acute sigmoid diverticulitis (series 3/image 77) with associated mesenteric stranding in the left lower  quadrant (series 3/image 80). No drainable fluid collection/abscess. No free air to suggest macroscopic perforation. Vascular/Lymphatic: No evidence of abdominal aortic aneurysm. No suspicious abdominopelvic lymphadenopathy. Reproductive: Uterus is unremarkable. Left ovary is unremarkable. No right adnexal mass. Other: Trace pelvic ascites. Musculoskeletal: Mild degenerative changes of the visualized thoracolumbar spine. IMPRESSION: Acute sigmoid diverticulitis. Trace pelvic ascites. No drainable fluid collection/abscess. No free air. Additional stable ancillary findings as above. Electronically Signed   By: Julian Hy M.D.   On: 12/14/2018 12:34    Procedures Procedures (including critical care time)  Medications Ordered in ED Medications  sodium chloride 0.9 % bolus 1,000 mL (has no administration in time range)  ondansetron (ZOFRAN) injection 4 mg (has no administration in time range)  fentaNYL (SUBLIMAZE) injection 25 mcg (has no administration in time range)     Initial Impression / Assessment and Plan / ED Course  I have reviewed the triage vital signs and the nursing notes.  Pertinent labs & imaging results that were available during my care of the patient were reviewed by me and considered in my medical decision making (see chart for details).        Patient is a 58 year old female with a history of gastric ulcer, GERD, DVT/PE, CHF and atrial fibrillation who presents today with diffuse abdominal tenderness.  She states for the last 2 days she has had ongoing and worsening abdominal pain.  She has been constipated and has not passed flatus in the last 2 days and has been nauseated but no vomiting.  Patient is hemodynamically stable here but does have a left upper, lower and suprapubic abdominal pain.  Concern for obstruction given prior history of abdominal surgeries versus  diverticulitis.  Patient denies any urinary symptoms with lower suspicion for pyelonephritis or UTI however kidney stone could also be possible.  Patient does have atrial fibrillation and prior PEs but does take Xarelto and does not miss any doses so mesenteric ischemia is unlikely.  Patient denies any Covid-like symptoms and has no chest pain shortness of breath.  There is no evidence of fluid overload today.  Labs and imaging are pending.  Patient given IV bolus that she has had nothing to eat or drink, pain and nausea medicine.   1:35 PM Patient CT is consistent with diverticulitis.  CBC and CMP without significant findings.  Patient is not vomiting and feel that she can tolerate oral medications.  She was given Cipro and Flagyl as she has an allergy to penicillins.  She does see Dr. Benson Norway with GI and will call the office for follow-up in the future.  She was given strict return precautions.  Patient and her husband's questions were answered Final Clinical Impressions(s) / ED Diagnoses   Final diagnoses:  Diverticulitis    ED Discharge Orders         Ordered    ciprofloxacin (CIPRO) 500 MG tablet  Every 12 hours     12/14/18 1330    metroNIDAZOLE (FLAGYL) 500 MG tablet  3 times daily     12/14/18 1330    HYDROcodone-acetaminophen (NORCO/VICODIN) 5-325 MG tablet  Every 6 hours PRN     12/14/18 1330           Blanchie Dessert, MD 12/14/18 1336

## 2018-12-15 ENCOUNTER — Telehealth: Payer: Self-pay | Admitting: Radiology

## 2018-12-15 NOTE — Telephone Encounter (Signed)
Patient left message on triage phone at [facility] for Dr. Estanislado Pandy.  States she has recently been diagnosed with diverticulitis attack. Patient currently takes plaquenil, instructed to take cipro due to attack except there is a contraindication.  Please advise  CB:  [phone]

## 2018-12-15 NOTE — Telephone Encounter (Signed)
Called patient at home number and line busy.  Called mobile number and no answer.  Left voicemail to return call.  Mariella Saa, PharmD, [place], [place] Clinical Specialty Pharmacist [phone]  12/15/2018 3:45 PM

## 2018-12-16 NOTE — Telephone Encounter (Signed)
Second attempt to contact and no answer.   Mariella Saa, PharmD, [place], [place] Clinical Specialty Pharmacist [phone]  12/16/2018 1:04 PM

## 2018-12-17 ENCOUNTER — Telehealth: Payer: Self-pay | Admitting: Rheumatology

## 2018-12-17 NOTE — Telephone Encounter (Signed)
Returned patient call.  She states she stopped Plaquenil 2 days ago when she started Cipro and Flagyl recommended by her PCP. We had reached out to patient to discuss the drug interaction and were unable to reach her.  She states she is having heart palpitations especially at night.  She asked if this could be due to stopping Plaquenil.  Advised that it is not a side effects of Plaquenil discontinuation or "withdrawal" symptom. Advised her to continue to hold Plaquenil. Patient verbalized understanding.  Advised patient that she is on multiple medications that can contribute to heart palpitations and recommend she follow up with her PCP or cardiologist.  Patient states that she has an upcoming appointment with her cardiologist and will address it at that time.  All questions encouraged and answered.  Instructed patient to call with any other questions or concerns.  Mariella Saa, PharmD, [place], [place] Clinical Specialty Pharmacist [phone]  12/17/2018 9:32 AM

## 2018-12-17 NOTE — Telephone Encounter (Signed)
Patient has stopped Plaquenil two days ago. Patient is taking Cipro for Diverticulitis. Patient states she is having heart palpitations, and wanted to know if this is normal after two days off Plaquenil? Please call patient to discuss.

## 2018-12-17 NOTE — Telephone Encounter (Signed)
Addressed in another telephone encounter.  Closing encounter.

## 2019-01-15 ENCOUNTER — Telehealth: Payer: Self-pay | Admitting: Orthopaedic Surgery

## 2019-01-15 NOTE — Telephone Encounter (Signed)
12/08/18 ov note faxed to [facility] [phone]

## 2019-02-11 ENCOUNTER — Ambulatory Visit: Payer: 59 | Admitting: Orthopaedic Surgery

## 2019-02-12 NOTE — Progress Notes (Signed)
Virtual Visit via Telephone Note  I connected with [name] on 02/20/19 at  9:30 AM EST by telephone and verified that I am speaking with the correct person using two identifiers.  Location: Patient: Home  Provider: Clinic  This service was conducted via virtual visit. The patient was located at home. I was located in my office.  Consent was obtained prior to the virtual visit and is aware of possible charges through their insurance for this visit.  The patient is an established patient.  Dr. Estanislado Pandy, MD conducted the virtual visit and Hazel Sams, PA-C acted as scribe during the service.  Office staff helped with scheduling follow up visits after the service was conducted.     I discussed the limitations, risks, security and privacy concerns of performing an evaluation and management service by telephone and the availability of in person appointments. I also discussed with the patient that there may be a patient responsible charge related to this service. The patient expressed understanding and agreed to proceed.  CC: Joint stiffness  History of Present Illness: [name] is a 59 y.o. female with history of seropositive rheumatoid arthritis.  She is taking Plaquenil 200 mg 1 tablet by mouth twice daily.  She restarted on PLQ on 10/23/18.  She is been tolerating Plaquenil without any side effects. She states she was diagnosed with diverticulitis in November and was treated with ciprofloxacin (started on 12/16/18-took for 7 days). She states her symptoms of diverticulitis have resolved. She had a follow up appointment with Dr. Benson Norway. She discontinued PLQ during that time for about 1 week. She states she had a flare while off of PLQ.  She has been experiencing increased fatigue and joint stiffness. She has been massaging her hands to help with the stiffness.   Review of Systems  Constitutional: Positive for malaise/fatigue. Negative for fever.  HENT: Negative for congestion.   Eyes:  Negative for photophobia, pain, discharge and redness.  Respiratory: Negative for cough, shortness of breath and wheezing.   Cardiovascular: Negative for chest pain and palpitations.  Gastrointestinal: Negative for blood in stool, constipation and diarrhea.  Genitourinary: Negative for dysuria and urgency.  Musculoskeletal: Positive for joint pain. Negative for back pain, myalgias and neck pain.       +Joint stiffness   Skin: Negative for rash.  Neurological: Negative for dizziness, weakness and headaches.  Psychiatric/Behavioral: Negative for depression and memory loss. The patient is not nervous/anxious and does not have insomnia.      Observations/Objective:  Physical Exam  Constitutional: She is oriented to person, place, and time.  Neurological: She is alert and oriented to person, place, and time.  Psychiatric: Mood, memory, affect and judgment normal.     Patient reports joint stiffness all day  Patient reports nocturnal pain.  Difficulty dressing/grooming: Denies Difficulty climbing stairs: Reports Difficulty getting out of chair: Reports Difficulty using hands for taps, buttons, cutlery, and/or writing: Denies  Assessment and Plan: Visit Diagnoses: Rheumatoid arthritis with rheumatoid factor of multiple sites without organ or systems involvement ([place]) - Positive rheumatoid factor, negative ANA, negative CCP: She is currently experiencing increased pain and stiffness in both hands and both knee joints.  She was diagnosed with diverticulitis in November 2020 and was treated with ciprofloxacin for 7 days, which resolved her symptoms.  She held plaquenil for 7-10 days while taking cipro but has resumed. She developed increased joint pain and stiffness while off of PLQ.   She declined a prednisone taper at  this time.  She will continue taking plaquenil 200 mg 1 tablet by mouth twice daily.  She was advised to notify us if she develops increased joint pain or joint swelling.  She will  follow up in 3 months.   High risk medication use -Plaquenil 200 mg 1 tablet twice daily resumed on 10/23/2018.  PLQ eye exam: 10/31/17.  She is due to update PLQ eye exam.  She has an appointment scheduled on 03/12/19.  CBC and CMP were drawn on 12/14/18.  She is due to update lab work in April and every 5 months.   History of arthroplasty of left knee - Dr. Ninfa Linden, 2018-Chronic pain.  She is currently in physical therapy, but she has not noticed any improvement. Her patella continues to sublux intermittently. She will be following up with Dr. Ninfa Linden next week.  She has not tried wearing a brace for support.   Primary osteoarthritis of right knee: Chronic pain and intermittent patella subluxation. She is followed by Dr. Ninfa Linden.   Other medical conditions are listed as follows:   History of diverticulitis: Flare in November 2020, Treated with Cipro for 7 days, which resolved her symptoms.  She is followed by Dr. Benson Norway.   ASD (atrial septal defect)  Controlled type 2 diabetes mellitus with diabetic nephropathy, unspecified whether long term insulin use (HCC)  Paroxysmal atrial fibrillation (HCC)  Hydronephrosis, right  Hydronephrosis, left  History of DVT (deep vein thrombosis)  Follow Up Instructions: She will follow up in 3 months.    I discussed the assessment and treatment plan with the patient. The patient was provided an opportunity to ask questions and all were answered. The patient agreed with the plan and demonstrated an understanding of the instructions.   The patient was advised to call back or seek an in-person evaluation if the symptoms worsen or if the condition fails to improve as anticipated.  I provided 15 minutes of non-face-to-face time during this encounter.  Bo Merino, MD   Scribed by-  Hazel Sams, PA-C

## 2019-02-20 ENCOUNTER — Telehealth (INDEPENDENT_AMBULATORY_CARE_PROVIDER_SITE_OTHER): Payer: 59 | Admitting: Rheumatology

## 2019-02-20 ENCOUNTER — Encounter: Payer: Self-pay | Admitting: Rheumatology

## 2019-02-20 ENCOUNTER — Other Ambulatory Visit: Payer: Self-pay

## 2019-02-20 DIAGNOSIS — Z79899 Other long term (current) drug therapy: Secondary | ICD-10-CM | POA: Diagnosis not present

## 2019-02-20 DIAGNOSIS — M1711 Unilateral primary osteoarthritis, right knee: Secondary | ICD-10-CM

## 2019-02-20 DIAGNOSIS — M0579 Rheumatoid arthritis with rheumatoid factor of multiple sites without organ or systems involvement: Secondary | ICD-10-CM | POA: Diagnosis not present

## 2019-02-20 DIAGNOSIS — I480 Paroxysmal atrial fibrillation: Secondary | ICD-10-CM

## 2019-02-20 DIAGNOSIS — Q211 Atrial septal defect, unspecified: Secondary | ICD-10-CM

## 2019-02-20 DIAGNOSIS — Z86718 Personal history of other venous thrombosis and embolism: Secondary | ICD-10-CM

## 2019-02-20 DIAGNOSIS — Z8719 Personal history of other diseases of the digestive system: Secondary | ICD-10-CM

## 2019-02-20 DIAGNOSIS — Z96652 Presence of left artificial knee joint: Secondary | ICD-10-CM

## 2019-02-20 DIAGNOSIS — E1121 Type 2 diabetes mellitus with diabetic nephropathy: Secondary | ICD-10-CM

## 2019-02-20 DIAGNOSIS — N1330 Unspecified hydronephrosis: Secondary | ICD-10-CM

## 2019-02-26 ENCOUNTER — Ambulatory Visit (INDEPENDENT_AMBULATORY_CARE_PROVIDER_SITE_OTHER): Payer: 59

## 2019-02-26 ENCOUNTER — Encounter: Payer: Self-pay | Admitting: Orthopaedic Surgery

## 2019-02-26 ENCOUNTER — Other Ambulatory Visit: Payer: Self-pay

## 2019-02-26 ENCOUNTER — Ambulatory Visit (INDEPENDENT_AMBULATORY_CARE_PROVIDER_SITE_OTHER): Payer: 59 | Admitting: Orthopaedic Surgery

## 2019-02-26 DIAGNOSIS — M25561 Pain in right knee: Secondary | ICD-10-CM | POA: Diagnosis not present

## 2019-02-26 MED ORDER — METHYLPREDNISOLONE ACETATE 40 MG/ML IJ SUSP
40.0000 mg | INTRAMUSCULAR | Status: AC | PRN
  Administered 2019-02-26: 40 mg via INTRA_ARTICULAR

## 2019-02-26 MED ORDER — LIDOCAINE HCL 1 % IJ SOLN
3.0000 mL | INTRAMUSCULAR | Status: AC | PRN
  Administered 2019-02-26: 3 mL

## 2019-02-26 NOTE — Progress Notes (Signed)
Office Visit Note   Patient: [name]           Date of Birth: 01-Jul-1960           MRN: [id number] Visit Date: 02/26/2019              Requested by: Mateo Flow, MD [place],  [place] [id number] PCP: Mateo Flow, MD   Assessment & Plan: Visit Diagnoses:  1. Right knee pain, unspecified chronicity     Plan: I did provide a steroid injection in her right knee today to try to temporize her pain.  I do feel an MRI is warranted of her right knee to assess for any type of stress fracture as well as assess the cartilage given amount of pain she is having.  With her weight, certainly is possible that she has significant subchondral edema and this is leading to worsening pain.  A MRI would be helpful for assessing the cartilage of her knee as well as the bone integrity.  We will see her back after this MRI is obtained.  She agrees with this treatment plan.  All question concerns were answered and addressed.  She did tolerate steroid injection well in her right knee.  Follow-Up Instructions: Return in about 2 weeks (around 03/12/2019).   Orders:  Orders Placed This Encounter  Procedures  . Large Joint Inj  . XR Knee 1-2 Views Right   No orders of the defined types were placed in this encounter.     Procedures: Large Joint Inj: R knee on 02/26/2019 1:17 PM Indications: diagnostic evaluation and pain Details: 22 G 1.5 in needle, superolateral approach  Arthrogram: No  Medications: 3 mL lidocaine 1 %; 40 mg methylPREDNISolone acetate 40 MG/ML Outcome: tolerated well, no immediate complications Procedure, treatment alternatives, risks and benefits explained, specific risks discussed. Consent was given by the patient. Immediately prior to procedure a time out was called to verify the correct patient, procedure, equipment, support staff and site/side marked as required. Patient was prepped and draped in the usual sterile fashion.       Clinical Data: No  additional findings.   Subjective: Chief Complaint  Patient presents with  . Right Hip - Follow-up  Jailine comes in today with an acute flareup of right knee pain.  She does have a history of a left total knee arthroplasty.  We did inject her right knee about 4 months ago.  It has now gotten severely bad.  Her trochanteric bursitis that we saw her for her right hip is not as bad.  I believe that some of the trochanteric bursitis was coming from her walking different dealing with her right knee pain.  HPI  Review of Systems She currently denies any headache, chest pain, shortness of breath, fever, chills, nausea, vomiting  Objective: Vital Signs: There were no vitals taken for this visit.  Physical Exam She is alert and orient x3 and in no acute distress Ortho Exam Examination of her right knee show significant medial joint line tenderness and some lateral joint line tenderness.  She has pain with flexion extension of that knee.  She has a lot of pain along the medial tibial plateau as well. Specialty Comments:  No specialty comments available.  Imaging: XR Knee 1-2 Views Right  Result Date: 02/26/2019 2 views of the right knee show no acute findings.  There is moderate arthritic changes with medial and lateral joint lines being slightly narrow.  There  is severe patellofemoral arthritic changes.  There are marginal osteophytes seen as well.  The alignment is near neutral.    PMFS History: Patient Active Problem List   Diagnosis Date Noted  . Leg edema 08/19/2018  . Presence of left artificial knee joint 08/14/2016  . Unilateral primary osteoarthritis, left knee 07/31/2016  . Status post total knee replacement, left 07/31/2016  . Abdominal hematoma   . Post-operative state   . Paroxysmal atrial fibrillation (HCC)   . Controlled type 2 diabetes mellitus with diabetic nephropathy ([place])   . Wound infection 01/25/2016  . Hydronephrosis, left 01/25/2016  . Hydronephrosis, right  01/25/2016  . DVT (deep venous thrombosis) ([place]) 01/25/2016  . Atrial fibrillation ([place]) 01/25/2016  . Diabetes mellitus with complication ([place])   . Wound infection after surgery, initial encounter   . Varicose veins of lower extremities with complications 99991111  . Pain and swelling of left lower leg 10/27/2013  . Pain of left lower extremity 10/27/2013  . Gastroenteritis, acute 09/08/2013  . Fever, unspecified 09/04/2013  . ASD (atrial septal defect) 08/13/2013  . Pulmonary embolism ([place]) 06/22/2013  . Dyspnea 06/11/2013  . Anterior slip repaired Oct 2012 01/18/2011  . GI bleed 12/18/2010  . Diabetes mellitus ([place]) 12/18/2010  . Varicose veins of lower extremities with other complications 99991111  . Diabetes mellitus   . HIP PAIN, RIGHT 03/06/2010   Past Medical History:  Diagnosis Date  . Anemia   . Arthritis   . Atrial fibrillation ([place])   . Bleeding ulcer 12-2010  hospitalized for 1 week  . Blood transfusion   . Blood transfusion without reported diagnosis    2011-2 units transfused, bleeding ulcer  . Bruises easily   . CHF (congestive heart failure) ([place])    with PE resolved after   . Constipation   . Difficulty in swallowing 03-24-13   no longer a problem  . Diverticulitis   . DVT (deep venous thrombosis) ([place]) 06-2013   right leg  . Dysrhythmia     Hx of A fib   . GERD (gastroesophageal reflux disease)   . Hyperlipidemia   . Leg swelling 03-24-13   not a problem any longer  . Peripheral vascular disease ([place])   . Pulmonary embolus ([place]) 06-2013  . Reflux   . Shortness of breath    with PE  . Vomiting 03-24-13   problem resolved after lap band revision    Family History  Problem Relation Age of Onset  . Heart disease Father   . Kidney disease Father   . Peripheral vascular disease Father   . Heart attack Father        19  . Diabetes Mother   . Arthritis Mother     Past Surgical History:  Procedure Laterality Date  . APPENDECTOMY    .  CARPEL TUNNEL BILATEREAL    . CHOLECYSTECTOMY     5'12  . COLONOSCOPY N/A 04/10/2013   Procedure: COLONOSCOPY;  Surgeon: Beryle Beams, MD;  Location: [facility];  Service: Endoscopy;  Laterality: N/A;  . DIAGNOSTIC LAPAROSCOPY    . ENDOVENOUS ABLATION SAPHENOUS VEIN W/ LASER  06-2008  . ENDOVENOUS ABLATION SAPHENOUS VEIN W/ LASER Left 12-09-2013   EVLA LEFT GREATER SAPHENOUS VEIN BY TODD EARLY MD  . ESOPHAGOGASTRODUODENOSCOPY  12/18/2010   Procedure: ESOPHAGOGASTRODUODENOSCOPY (EGD);  Surgeon: Gatha Mayer, MD;  Location: [facility];  Service: Endoscopy;  Laterality: N/A;  . JOINT REPLACEMENT    . LAPAROSCOPIC GASTRIC BANDING  01-05-2008  .  LAPAROSCOPIC GASTRIC BANDING  12/18/2010   01/04/2009  . SALPHINGOOOPHORECTOMY Right    cyst removal  . TONSILLECTOMY    . TOTAL KNEE ARTHROPLASTY Left 07/31/2016   Procedure: LEFT TOTAL KNEE ARTHROPLASTY;  Surgeon: [name], MD;  Location: [facility];  Service: Orthopedics;  Laterality: Left;  . WEDGE RESECTION OF OVARY  LEFT OVARY    Social History   Occupational History  . Occupation: Programmer, multimedia: [employer]  Tobacco Use  . Smoking status: Never Smoker  . Smokeless tobacco: Never Used  Substance and Sexual Activity  . Alcohol use: No    Alcohol/week: 0.0 standard drinks  . Drug use: No  . Sexual activity: Yes    Partners: Male    Birth control/protection: None

## 2019-03-03 ENCOUNTER — Telehealth: Payer: Self-pay | Admitting: Rheumatology

## 2019-03-03 NOTE — Telephone Encounter (Signed)
I LMOM for patient to call, and schedule a follow up appointment in 3 months (around 05/17/2019).

## 2019-03-03 NOTE — Telephone Encounter (Signed)
-----   Message from Shona Needles, RT sent at 02/20/2019 12:12 PM EST ----- Regarding: 3 MONTH F/U

## 2019-03-05 ENCOUNTER — Encounter: Payer: Self-pay | Admitting: Cardiovascular Disease

## 2019-03-05 ENCOUNTER — Ambulatory Visit (INDEPENDENT_AMBULATORY_CARE_PROVIDER_SITE_OTHER): Payer: 59 | Admitting: Cardiovascular Disease

## 2019-03-05 ENCOUNTER — Other Ambulatory Visit: Payer: Self-pay

## 2019-03-05 VITALS — BP 132/82 | HR 72 | Ht 66.0 in | Wt 329.0 lb

## 2019-03-05 DIAGNOSIS — Z7901 Long term (current) use of anticoagulants: Secondary | ICD-10-CM | POA: Diagnosis not present

## 2019-03-05 DIAGNOSIS — I480 Paroxysmal atrial fibrillation: Secondary | ICD-10-CM | POA: Diagnosis not present

## 2019-03-05 DIAGNOSIS — R600 Localized edema: Secondary | ICD-10-CM

## 2019-03-05 DIAGNOSIS — R002 Palpitations: Secondary | ICD-10-CM

## 2019-03-05 NOTE — Patient Instructions (Signed)

## 2019-03-05 NOTE — Progress Notes (Signed)
Date:  03/05/2019   ID:  [name], DOB 08-Nov-1960, MRN [id number]  Patient Location: Home Provider Location: Home  PCP:  Mateo Flow, MD  Cardiologist:  Mertie Moores, MD  Electrophysiologist:  None   Problem List Problem list 1. Pulmonary embolus 2. Atrial fibrillation 3.  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  The patient has an interesting cardiopulmonary history.  She was in her usual state of health until late April 2015 when she began to have pulmonary congestion symptoms and thought that she had the flu.  She saw her PCP in [place] several times initially her oxygen saturation at the first visit was 96% and the second visit was 92%.  She continued to worsen and collapsed at home.  She was brought to [facility] where in the emergency room her d-dimer was 13 and she underwent a CT angiogram which showed massive pulmonary embolus with saddle embolus.  She had slight elevation of her troponins consistent with pulmonary embolus.  Her initial echocardiogram on 06/11/13 showed a moderate hypertension with a pulmonary artery pressure of 41.  The ventricular septum showed paradoxic motion consistent with right ventricular volume overload.  There was a probable medium sized ostium secundum ASD present.  The right atrium was markedly dilated.  The patient went on to be treated with transcatheter ultrasound assisted thrombolytic infusion therapy for her saddle embolus.  She had a good clinical response.  She had a followup echocardiogram on 06/22/13 which showed that her pulmonary artery pressure had returned to normal at 16.  The right atrial size was normal.  The right ventricular size was normal.  There was no evidence of ASD or PFO. The patient was evaluated for hypercoagulable state as the cause of her pulmonary embolus and her DVT of her right leg.  However the hypercoagulability workup was negative.  The patient does not have any family history of  abnormal blood clotting. The patient herself has no prior cardiac history.  She does have a past history of morbid obesity and has had lap band surgery by Dr. Johnathan Hausen.  The patient initially weighed about 388 pounds.  Since her surgery she has lost approximately 130 pounds. She still is experiencing mild exertional dyspnea.  She denies any chest discomfort.  She has not been aware of any racing of her heart.  May 06, 2015 Was recently found have atrial fibrillation. She's been having some increased shortness of breath and fatigue. [facility] and was found have atrial fibrillation at rate of 154. Was treated with Diltiazem and metoprolol and she feels much better.   Thinks that she has converted .  July 10, 2016:  [name] is here for pre- op clearance for left knee replacement  Has not any noticeable episodes of Afib .  On Xarelto .  July 22, 2017:  Had an episode of PAF after taking cold meds.  Resolved after fluid, rest, and an extra metoprolol  Has mild swelling in feet  Is not getting exercise, sits most of the day  Wt. Is 276.    Evaluation Performed:  Follow-Up Visit  Chief Complaint:  Follow up PAF and hx of pulmonary embolus  July 17, 2018   [name] is a 59 y.o. female with a history of paroxysmal atrial fibrillation and a history of pulmonary embolus.  Several weeks ago woke up with AF,  Dyspnea, Lasted for 45 min Took 2nd  metoprolol at that time Developed leg swelling and shortness of breath Was started on lasix  Leg edema has not improved despite lasix  ( she showed me her legs on camara )  Left >> right leg swelling   The patient does not have symptoms concerning for COVID-19 infection (fever, chills, cough, or new shortness of breath).    August 19, 2018:  [name] is seen today for follow up visit.     Wt is 317 lbs  - up 40 lbs  Had lap band surgery 10 years ago .   We saw her in June - telemedicine visit  -   Had developed 2+ leg edema  We changed the lasix to Torsemide.  Echo for normal left ventricular systolic function.  Ejection fraction is 60 to 65%.  Normal diastolic function.  Mild thickening of the aortic valve.  Event monitor showed rare episoes of NSVT.     Jan. 28, 2021  Last visit was via telemedicine  Doing well from a cardiac standpont  Her event monitor revealed 5 beats of supraventricular tachycardia.  She seems to be much better on the Toprol. Has rare episodes of palpitations that only last for a second or 2.  Not associated with syncope or presyncope. No exercise,  Works at [employer].    Had an RA flare this past summer .   Arthritis is better on Plaquenil  Has developed diverticulitis    Past Medical History:  Diagnosis Date  . Anemia   . Arthritis   . Atrial fibrillation ([place])   . Bleeding ulcer 12-2010  hospitalized for 1 week  . Blood transfusion   . Blood transfusion without reported diagnosis    2011-2 units transfused, bleeding ulcer  . Bruises easily   . CHF (congestive heart failure) ([place])    with PE resolved after   . Constipation   . Difficulty in swallowing 03-24-13   no longer a problem  . Diverticulitis   . DVT (deep venous thrombosis) ([place]) 06-2013   right leg  . Dysrhythmia     Hx of A fib   . GERD (gastroesophageal reflux disease)   . Hyperlipidemia   . Leg swelling 03-24-13   not a problem any longer  . Peripheral vascular disease ([place])   . Pulmonary embolus ([place]) 06-2013  . Reflux   . Shortness of breath    with PE  . Vomiting 03-24-13   problem resolved after lap band revision   Past Surgical History:  Procedure Laterality Date  . APPENDECTOMY    . CARPEL TUNNEL BILATEREAL    . CHOLECYSTECTOMY     5'12  . COLONOSCOPY N/A 04/10/2013   Procedure: COLONOSCOPY;  Surgeon: Beryle Beams, MD;  [facility];  Service: Endoscopy;  Laterality: N/A;  . DIAGNOSTIC LAPAROSCOPY    . ENDOVENOUS ABLATION SAPHENOUS VEIN W/ LASER   06-2008  . ENDOVENOUS ABLATION SAPHENOUS VEIN W/ LASER Left 12-09-2013   EVLA LEFT GREATER SAPHENOUS VEIN BY TODD EARLY MD  . ESOPHAGOGASTRODUODENOSCOPY  12/18/2010   Procedure: ESOPHAGOGASTRODUODENOSCOPY (EGD);  Surgeon: Gatha Mayer, MD;  Location: [facility];  Service: Endoscopy;  Laterality: N/A;  . JOINT REPLACEMENT    . LAPAROSCOPIC GASTRIC BANDING  01-05-2008  . LAPAROSCOPIC GASTRIC BANDING  12/18/2010   01/04/2009  . SALPHINGOOOPHORECTOMY Right    cyst removal  . TONSILLECTOMY    . TOTAL KNEE ARTHROPLASTY Left 07/31/2016   Procedure: LEFT TOTAL KNEE ARTHROPLASTY;  Surgeon: [name], MD;  Location: [facility];  Service: Orthopedics;  Laterality: Left;  . WEDGE RESECTION OF OVARY  LEFT OVARY      Current Meds  Medication Sig  . acetaminophen (TYLENOL) 325 MG tablet Take 650 mg by mouth every 6 (six) hours as needed for mild pain or moderate pain.  . Artificial Tear Ointment (DRY EYES OP) Apply 1 drop to eye daily as needed (for dry eyes).  . Cholecalciferol (VITAMIN D-3) 125 MCG (5000 UT) TABS Take 1 tablet by mouth daily.  . hydroxychloroquine (PLAQUENIL) 200 MG tablet TAKE 1 TABLET BY MOUTH TWICE A DAY  . metoprolol succinate (TOPROL-XL) 25 MG 24 hr tablet Take 1 tablet (25 mg total) by mouth daily.  . Multiple Vitamin (MULTIVITAMIN) tablet Take 1 tablet by mouth daily.  Marland Kitchen omeprazole (PRILOSEC) 20 MG capsule omeprazole 20 mg capsule,delayed release  . potassium chloride (KLOR-CON) 10 MEQ tablet Take 10 mEq by mouth as needed. When taking torsemide  . rivaroxaban (XARELTO) 20 MG TABS tablet Take 20 mg by mouth daily.   . simethicone (MYLICON) 0000000 MG chewable tablet Chew 125 mg by mouth every 6 (six) hours as needed for flatulence.  . SYMBICORT 80-4.5 MCG/ACT inhaler Inhale 2 puffs into the lungs daily as needed (shortness of breath).   . torsemide (DEMADEX) 20 MG tablet Take 20 mg by mouth as needed.      Allergies:   Contrast media [iodinated diagnostic agents],  Penicillins, and Ancef [cefazolin sodium]   Social History   Tobacco Use  . Smoking status: Never Smoker  . Smokeless tobacco: Never Used  Substance Use Topics  . Alcohol use: No    Alcohol/week: 0.0 standard drinks  . Drug use: No     Family Hx: The patient's family history includes Arthritis in her mother; Diabetes in her mother; Heart attack in her father; Heart disease in her father; Kidney disease in her father; Peripheral vascular disease in her father.  ROS:   Please see the history of present illness.     All other systems reviewed and are negative.   Prior CV studies:   The following studies were reviewed today:    Labs/Other Tests and Data Reviewed:    EKG:  Jan. 28, 2021  NSR at 72.  Inc. RBBB   Recent Labs: 07/21/2018: TSH 2.050 12/14/2018: ALT 32; BUN 6; Creatinine, Ser 0.67; Hemoglobin 13.2; Platelets 185; Potassium 4.1; Sodium 136   Recent Lipid Panel No results found for: CHOL, TRIG, HDL, CHOLHDL, LDLCALC, LDLDIRECT  Wt Readings from Last 3 Encounters:  03/05/19 (!) 329 lb (149.2 kg)  11/20/18 (!) 324 lb 6.4 oz (147.1 kg)  10/23/18 (!) 323 lb (146.5 kg)     Objective:    Physical Exam: Blood pressure 132/82, pulse 72, height 5\' 6"  (1.676 m), weight (!) 329 lb (149.2 kg), SpO2 98 %.  GEN:  morbildly obese female,   NAD  HEENT: Normal NECK: No JVD; No carotid bruits LYMPHATICS: No lymphadenopathy CARDIAC: RRR , no murmurs, rubs, gallops RESPIRATORY:  Clear to auscultation without rales, wheezing or rhonchi  ABDOMEN: Soft, non-tender, non-distended MUSCULOSKELETAL:   She does have some chronic stasis changes in her legs are erythematous.  There is no significant edema. SKIN: Warm and dry NEUROLOGIC:  Alert and oriented x 3    ASSESSMENT & PLAN:    Acute leg edema: Her edema has resolved.  She has normal left ventricular function and right ventricular function by echo.  I suspect that her leg edema is due  to her obesity.  I encouraged her to  work on more exercise and to work on some weight loss.  Continue current medications.   2.  Palpitations:   Her palpitations are better on the Toprol XL.  The event monitor revealed only a 5 beat episode of nonsustained supraventricular tachycardia.  See her again in 1 year.    Medication Adjustments/Labs and Tests Ordered: Current medicines are reviewed at length with the patient today.  Concerns regarding medicines are outlined above.   Tests Ordered: Orders Placed This Encounter  Procedures  . EKG 12-Lead    Medication Changes: No orders of the defined types were placed in this encounter.   Disposition:  Follow up in 6 month(s)  Signed, Mertie Moores, MD  03/05/2019 9:47 AM    [facility]

## 2019-03-12 ENCOUNTER — Ambulatory Visit: Payer: 59 | Admitting: Orthopaedic Surgery

## 2019-03-12 LAB — HM DIABETES EYE EXAM

## 2019-03-26 ENCOUNTER — Other Ambulatory Visit: Payer: 59

## 2019-03-30 ENCOUNTER — Ambulatory Visit: Payer: 59 | Admitting: Orthopaedic Surgery

## 2019-04-05 NOTE — Progress Notes (Signed)
Plaquenil eye exam was normal on March 12, 2019.

## 2019-04-16 ENCOUNTER — Ambulatory Visit
Admission: RE | Admit: 2019-04-16 | Discharge: 2019-04-16 | Disposition: A | Discharge status: Home / Self Care | Payer: 59 | Source: Ambulatory Visit | Attending: Orthopaedic Surgery | Admitting: Orthopaedic Surgery

## 2019-04-16 ENCOUNTER — Other Ambulatory Visit: Payer: Self-pay

## 2019-04-16 DIAGNOSIS — M25561 Pain in right knee: Secondary | ICD-10-CM

## 2019-04-20 ENCOUNTER — Ambulatory Visit: Payer: 59 | Admitting: Orthopaedic Surgery

## 2019-04-22 ENCOUNTER — Other Ambulatory Visit: Payer: Self-pay | Admitting: Rheumatology

## 2019-04-22 DIAGNOSIS — M0579 Rheumatoid arthritis with rheumatoid factor of multiple sites without organ or systems involvement: Secondary | ICD-10-CM

## 2019-04-23 ENCOUNTER — Telehealth: Payer: Self-pay | Admitting: Rheumatology

## 2019-04-23 DIAGNOSIS — Z79899 Other long term (current) drug therapy: Secondary | ICD-10-CM

## 2019-04-23 NOTE — Telephone Encounter (Signed)
Please schedule patient for a follow up visit. Patient due April 2021. Thanks!

## 2019-04-23 NOTE — Telephone Encounter (Signed)
Patient requested a return call to let her know if she is due for labwork. ?

## 2019-04-23 NOTE — Telephone Encounter (Signed)
Last Visit: 02/20/19 Next Visit: due April 2021. Message sent to the front to schedule patient  Labs: 12/14/18 RBC 5.21, MCH 25.3, RDW 18.7, Neutro Abs 7.8, Glucose 121, Calcium 8.7, Albumin 3.4, Total Bilirubin 1.3  Eye exam: 03/12/2019 Normal.  Okay to refill per Dr. Estanislado Pandy

## 2019-04-23 NOTE — Telephone Encounter (Signed)
Patient advised she is due for labs at the beginning of April. Reviewed lab hours with patient.

## 2019-04-23 NOTE — Telephone Encounter (Signed)
I LMOM for patient to call, and schedule a follow up appointment for 05/2019.

## 2019-05-15 ENCOUNTER — Other Ambulatory Visit: Payer: Self-pay

## 2019-05-15 DIAGNOSIS — Z79899 Other long term (current) drug therapy: Secondary | ICD-10-CM

## 2019-05-16 LAB — CBC WITH DIFFERENTIAL/PLATELET
Absolute Monocytes: 324 cells/uL (ref 200–950)
Basophils Absolute: 40 cells/uL (ref 0–200)
Basophils Relative: 1.1 %
Eosinophils Absolute: 284 cells/uL (ref 15–500)
Eosinophils Relative: 7.9 %
HCT: 40.2 % (ref 35.0–45.0)
Hemoglobin: 12.8 g/dL (ref 11.7–15.5)
Lymphs Abs: 1300 cells/uL (ref 850–3900)
MCH: 26.2 pg — ABNORMAL LOW (ref 27.0–33.0)
MCHC: 31.8 g/dL — ABNORMAL LOW (ref 32.0–36.0)
MCV: 82.4 fL (ref 80.0–100.0)
MPV: 10.6 fL (ref 7.5–12.5)
Monocytes Relative: 9 %
Neutro Abs: 1652 cells/uL (ref 1500–7800)
Neutrophils Relative %: 45.9 %
Platelets: 164 10*3/uL (ref 140–400)
RBC: 4.88 10*6/uL (ref 3.80–5.10)
RDW: 16.4 % — ABNORMAL HIGH (ref 11.0–15.0)
Total Lymphocyte: 36.1 %
WBC: 3.6 10*3/uL — ABNORMAL LOW (ref 3.8–10.8)

## 2019-05-16 LAB — COMPLETE METABOLIC PANEL WITH GFR
AG Ratio: 1.5 (calc) (ref 1.0–2.5)
ALT: 38 U/L — ABNORMAL HIGH (ref 6–29)
AST: 51 U/L — ABNORMAL HIGH (ref 10–35)
Albumin: 3.9 g/dL (ref 3.6–5.1)
Alkaline phosphatase (APISO): 78 U/L (ref 37–153)
BUN: 12 mg/dL (ref 7–25)
CO2: 22 mmol/L (ref 20–32)
Calcium: 8.7 mg/dL (ref 8.6–10.4)
Chloride: 107 mmol/L (ref 98–110)
Creat: 0.56 mg/dL (ref 0.50–0.99)
GFR, Est African American: 117 mL/min/{1.73_m2} (ref >=60)
GFR, Est Non African American: 101 mL/min/{1.73_m2} (ref >=60)
Globulin: 2.6 g/dL (calc) (ref 1.9–3.7)
Glucose, Bld: 149 mg/dL — ABNORMAL HIGH (ref 65–99)
Potassium: 4.1 mmol/L (ref 3.5–5.3)
Sodium: 141 mmol/L (ref 135–146)
Total Bilirubin: 0.3 mg/dL (ref 0.2–1.2)
Total Protein: 6.5 g/dL (ref 6.1–8.1)

## 2019-05-17 NOTE — Progress Notes (Signed)
Glucose and LFTs are high. Please, forward labs to her PCP and Dr. Benson Norway. She was treated with antibiotics. WBC is low which could be due to PLQ use. We will recheck labs in 3 months.

## 2019-05-19 ENCOUNTER — Other Ambulatory Visit: Payer: Self-pay | Admitting: *Deleted

## 2019-05-19 DIAGNOSIS — Z79899 Other long term (current) drug therapy: Secondary | ICD-10-CM

## 2019-06-03 ENCOUNTER — Other Ambulatory Visit: Payer: Self-pay | Admitting: Rheumatology

## 2019-06-03 DIAGNOSIS — M0579 Rheumatoid arthritis with rheumatoid factor of multiple sites without organ or systems involvement: Secondary | ICD-10-CM

## 2019-06-03 NOTE — Telephone Encounter (Signed)
Last Visit: 02/20/2019 telemedicine  Next Visit: 06/04/2019 Labs: 05/15/2019 Glucose and LFTs are high. Please, forward labs to her PCP and Dr. Benson Norway. She was treated with antibiotics. WBC is low which could be due to PLQ use. We will recheck labs in 3 months. Eye exam: 03/12/2019  Okay to refill per Dr. Estanislado Pandy.

## 2019-06-03 NOTE — Progress Notes (Signed)
Office Visit Note  Patient: [name]             Date of Birth: March 17, 1960           MRN: [id number]             PCP: Mateo Flow, MD Referring: Mateo Flow, MD Visit Date: 06/04/2019 Occupation: @GUAROCC @  Subjective:  Pain in joints.   History of Present Illness: [name] is a 59 y.o. female with history of seropositive rheumatoid arthritis and osteoarthritis.  She has been tolerating Plaquenil well.  She states she has been experiencing stiffness in her hands bilaterally.  She has difficulty making a fist with her left hand in the morning.  She also has discomfort in her left wrist.  Her left total knee replacement is doing well.  She is having some discomfort in her right knee joint and also lower back.  She has difficulty climbing stairs and getting up from the chair because of the knee joint and lower back pain.  Activities of Daily Living:  Patient reports morning stiffness for 15 minutes.   Patient Denies nocturnal pain.  Difficulty dressing/grooming: Denies Difficulty climbing stairs: Reports knee joint and lower back pain Difficulty getting out of chair: Reports Difficulty using hands for taps, buttons, cutlery, and/or writing: Denies  Review of Systems  Constitutional: Positive for fatigue and weight gain. Negative for night sweats and weight loss.  HENT: Negative for mouth sores, trouble swallowing, trouble swallowing, mouth dryness and nose dryness.   Eyes: Positive for dryness. Negative for pain, redness and visual disturbance.  Respiratory: Negative for cough, shortness of breath and difficulty breathing.   Cardiovascular: Positive for palpitations and swelling in legs/feet. Negative for chest pain, hypertension and irregular heartbeat.  Gastrointestinal: Negative for blood in stool, constipation and diarrhea.  Endocrine: Negative for increased urination.  Genitourinary: Negative for vaginal dryness.  Musculoskeletal: Positive for arthralgias,  joint pain and morning stiffness. Negative for joint swelling, myalgias, muscle weakness, muscle tenderness and myalgias.  Skin: Negative for color change, rash, hair loss, skin tightness, ulcers and sensitivity to sunlight.  Allergic/Immunologic: Negative for susceptible to infections.  Neurological: Negative for dizziness, memory loss, night sweats and weakness.  Hematological: Negative for swollen glands.  Psychiatric/Behavioral: Negative for depressed mood and sleep disturbance. The patient is not nervous/anxious.     PMFS History:  Patient Active Problem List   Diagnosis Date Noted  . Leg edema 08/19/2018  . Presence of left artificial knee joint 08/14/2016  . Unilateral primary osteoarthritis, left knee 07/31/2016  . Status post total knee replacement, left 07/31/2016  . Abdominal hematoma   . Post-operative state   . Paroxysmal atrial fibrillation (HCC)   . Controlled type 2 diabetes mellitus with diabetic nephropathy ([place])   . Wound infection 01/25/2016  . Hydronephrosis, left 01/25/2016  . Hydronephrosis, right 01/25/2016  . DVT (deep venous thrombosis) ([place]) 01/25/2016  . Atrial fibrillation ([place]) 01/25/2016  . Diabetes mellitus with complication ([place])   . Wound infection after surgery, initial encounter   . Varicose veins of lower extremities with complications 99991111  . Pain and swelling of left lower leg 10/27/2013  . Pain of left lower extremity 10/27/2013  . Gastroenteritis, acute 09/08/2013  . Fever, unspecified 09/04/2013  . ASD (atrial septal defect) 08/13/2013  . Pulmonary embolism ([place]) 06/22/2013  . Dyspnea 06/11/2013  . Anterior slip repaired Oct 2012 01/18/2011  . GI bleed 12/18/2010  . Diabetes mellitus ([place]) 12/18/2010  .  Varicose veins of lower extremities with other complications 99991111  . Diabetes mellitus   . HIP PAIN, RIGHT 03/06/2010    Past Medical History:  Diagnosis Date  . Anemia   . Arthritis   . Atrial fibrillation ([place])   .  Bleeding ulcer 12-2010  hospitalized for 1 week  . Blood transfusion   . Blood transfusion without reported diagnosis    2011-2 units transfused, bleeding ulcer  . Bruises easily   . CHF (congestive heart failure) ([place])    with PE resolved after   . Constipation   . Difficulty in swallowing 03-24-13   no longer a problem  . Diverticulitis   . DVT (deep venous thrombosis) ([place]) 06-2013   right leg  . Dysrhythmia     Hx of A fib   . GERD (gastroesophageal reflux disease)   . Hyperlipidemia   . Leg swelling 03-24-13   not a problem any longer  . Peripheral vascular disease ([place])   . Pulmonary embolus ([place]) 06-2013  . Reflux   . Shortness of breath    with PE  . Vomiting 03-24-13   problem resolved after lap band revision    Family History  Problem Relation Age of Onset  . Heart disease Father   . Kidney disease Father   . Peripheral vascular disease Father   . Heart attack Father        58  . Diabetes Mother   . Arthritis Mother    Past Surgical History:  Procedure Laterality Date  . APPENDECTOMY    . CARPEL TUNNEL BILATEREAL    . CHOLECYSTECTOMY     5'12  . COLONOSCOPY N/A 04/10/2013   Procedure: COLONOSCOPY;  Surgeon: Beryle Beams, MD;  Location: [facility];  Service: Endoscopy;  Laterality: N/A;  . DIAGNOSTIC LAPAROSCOPY    . ENDOVENOUS ABLATION SAPHENOUS VEIN W/ LASER  06-2008  . ENDOVENOUS ABLATION SAPHENOUS VEIN W/ LASER Left 12-09-2013   EVLA LEFT GREATER SAPHENOUS VEIN BY TODD EARLY MD  . ESOPHAGOGASTRODUODENOSCOPY  12/18/2010   Procedure: ESOPHAGOGASTRODUODENOSCOPY (EGD);  Surgeon: Gatha Mayer, MD;  Location: [facility];  Service: Endoscopy;  Laterality: N/A;  . JOINT REPLACEMENT    . LAPAROSCOPIC GASTRIC BANDING  01-05-2008  . LAPAROSCOPIC GASTRIC BANDING  12/18/2010   01/04/2009  . SALPHINGOOOPHORECTOMY Right    cyst removal  . TONSILLECTOMY    . TOTAL KNEE ARTHROPLASTY Left 07/31/2016   Procedure: LEFT TOTAL KNEE ARTHROPLASTY;  Surgeon:  [name], MD;  Location: [facility];  Service: Orthopedics;  Laterality: Left;  . WEDGE RESECTION OF OVARY  LEFT OVARY    Social History   Social History Narrative  . Not on file   Immunization History  Administered Date(s) Administered  . Influenza Split 10/06/2012  . Influenza,inj,Quad PF,6+ Mos 01/30/2016  . Pneumococcal Conjugate-13 11/29/2015     Objective: Vital Signs: BP (!) 143/77 (BP Location: Left Arm, Patient Position: Sitting, Cuff Size: Normal)   Pulse 73   Resp 18   Ht 5\' 6"  (1.676 m)   Wt (!) 342 lb 12.8 oz (155.5 kg)   BMI 55.33 kg/m    Physical Exam Vitals and nursing note reviewed.  Constitutional:      Appearance: She is well-developed.  HENT:     Head: Normocephalic and atraumatic.  Eyes:     Conjunctiva/sclera: Conjunctivae normal.  Cardiovascular:     Rate and Rhythm: Normal rate and regular rhythm.     Heart sounds: Normal heart sounds.  Pulmonary:     Effort: Pulmonary effort is normal.     Breath sounds: Normal breath sounds.  Abdominal:     General: Bowel sounds are normal.     Palpations: Abdomen is soft.  Musculoskeletal:     Cervical back: Normal range of motion.  Lymphadenopathy:     Cervical: No cervical adenopathy.  Skin:    General: Skin is warm and dry.     Capillary Refill: Capillary refill takes less than 2 seconds.  Neurological:     Mental Status: She is alert and oriented to person, place, and time.  Psychiatric:        Behavior: Behavior normal.      Musculoskeletal Exam: C-spine was in good range of motion.  Shoulder joints, elbow joints, wrist joints, MCPs, PIPs and DIPs in good range of motion with no synovitis.  Hip joints, knee joints, ankles, MTPs and PIPs in good range of motion with no synovitis.  Her left knee joint is replaced which is doing well.  CDAI Exam: CDAI Score: 0.6  Patient Global: 4 mm; Provider Global: 2 mm Swollen: 0 ; Tender: 0  Joint Exam 06/04/2019   No joint exam has been  documented for this visit   There is currently no information documented on the homunculus. Go to the Rheumatology activity and complete the homunculus joint exam.  Investigation: No additional findings.  Imaging: No results found.  Recent Labs: Lab Results  Component Value Date   WBC 3.6 (L) 05/15/2019   HGB 12.8 05/15/2019   PLT 164 05/15/2019   NA 141 05/15/2019   K 4.1 05/15/2019   CL 107 05/15/2019   CO2 22 05/15/2019   GLUCOSE 149 (H) 05/15/2019   BUN 12 05/15/2019   CREATININE 0.56 05/15/2019   BILITOT 0.3 05/15/2019   ALKPHOS 58 12/14/2018   AST 51 (H) 05/15/2019   ALT 38 (H) 05/15/2019   PROT 6.5 05/15/2019   ALBUMIN 3.4 (L) 12/14/2018   CALCIUM 8.7 05/15/2019   GFRAA 117 05/15/2019    Speciality Comments: PLQ eye exam: 03/12/2019 Normal. [facility]. Follow up in 1 year.  Procedures:  No procedures performed Allergies: Contrast media [iodinated diagnostic agents], Penicillins, and Ancef [cefazolin sodium]   Assessment / Plan:     Visit Diagnoses: Rheumatoid arthritis with rheumatoid factor of multiple sites without organ or systems involvement (HCC) - Positive rheumatoid factor, negative ANA, negative CCP: Patient complains of some stiffness and intermittent swelling in her hands.  She had no synovitis on my examination.  She has been tolerating Plaquenil well.  High risk medication use -she is on Plaquenil 200 mg p.o. twice daily.  Her last eye exam was in February 2021.  Her labs in April showed elevated LFTs.  Elevated LFTs-I am uncertain about the etiology.  Patient states she does not take any NSAIDs.  She states she does not take Tylenol on regular basis.  She has gained weight.  She states she has appointment coming up with Dr. Hassell Done where she had gastric lap band surgery.  I have advised her to repeat LFTs in a month with her PCP or Dr. Hassell Done.  S/P total knee replacement, left-done by Dr. Ninfa Linden in June 2018.  She is doing well.  Primary  osteoarthritis of right knee-she continues to have some stiffness in her right knee joint and difficulty with mobility.  ASD (atrial septal defect)  Paroxysmal atrial fibrillation (HCC)  Controlled type 2 diabetes mellitus with diabetic nephropathy, unspecified whether long  term insulin use (HCC)  Hydronephrosis, right  Hydronephrosis, left  History of DVT (deep vein thrombosis)  History of diverticulitis  BMI 50.0-59.9, adult ([place]) - Status post gastric lap band.  Patient states that her LAP-BAND is not functioning well and she has gained weight.  She has appointment coming up with Dr. Hassell Done.  Weight loss diet and exercise was emphasized.  Orders: No orders of the defined types were placed in this encounter.  No orders of the defined types were placed in this encounter.    Follow-Up Instructions: Return in about 5 months (around 11/04/2019) for Rheumatoid arthritis.   Bo Merino, MD  Note - This record has been created using Editor, commissioning.  Chart creation errors have been sought, but may not always  have been located. Such creation errors do not reflect on  the standard of medical care.

## 2019-06-04 ENCOUNTER — Encounter: Payer: Self-pay | Admitting: Rheumatology

## 2019-06-04 ENCOUNTER — Ambulatory Visit (INDEPENDENT_AMBULATORY_CARE_PROVIDER_SITE_OTHER): Payer: 59 | Admitting: Rheumatology

## 2019-06-04 ENCOUNTER — Other Ambulatory Visit: Payer: Self-pay

## 2019-06-04 VITALS — BP 143/77 | HR 73 | Resp 18 | Ht 66.0 in | Wt 342.8 lb

## 2019-06-04 DIAGNOSIS — Z96652 Presence of left artificial knee joint: Secondary | ICD-10-CM

## 2019-06-04 DIAGNOSIS — Q211 Atrial septal defect, unspecified: Secondary | ICD-10-CM

## 2019-06-04 DIAGNOSIS — M0579 Rheumatoid arthritis with rheumatoid factor of multiple sites without organ or systems involvement: Secondary | ICD-10-CM

## 2019-06-04 DIAGNOSIS — R7989 Other specified abnormal findings of blood chemistry: Secondary | ICD-10-CM

## 2019-06-04 DIAGNOSIS — M1711 Unilateral primary osteoarthritis, right knee: Secondary | ICD-10-CM

## 2019-06-04 DIAGNOSIS — I480 Paroxysmal atrial fibrillation: Secondary | ICD-10-CM

## 2019-06-04 DIAGNOSIS — Z8719 Personal history of other diseases of the digestive system: Secondary | ICD-10-CM

## 2019-06-04 DIAGNOSIS — Z86718 Personal history of other venous thrombosis and embolism: Secondary | ICD-10-CM

## 2019-06-04 DIAGNOSIS — N1330 Unspecified hydronephrosis: Secondary | ICD-10-CM

## 2019-06-04 DIAGNOSIS — E1121 Type 2 diabetes mellitus with diabetic nephropathy: Secondary | ICD-10-CM

## 2019-06-04 DIAGNOSIS — Z6841 Body Mass Index (BMI) 40.0 and over, adult: Secondary | ICD-10-CM

## 2019-06-04 DIAGNOSIS — Z79899 Other long term (current) drug therapy: Secondary | ICD-10-CM

## 2019-06-04 NOTE — Patient Instructions (Signed)
Standing Labs We placed an order today for your standing lab work.    Please come back and get your standing labs in July  We have open lab daily Monday through Thursday from 8:30-12:30 PM and 1:30-4:30 PM and Friday from 8:30-12:30 PM and 1:30-4:00 PM at the office of Dr. Bo Merino.   You may experience shorter wait times on Monday and Friday afternoons. The office is located at [address] [place], [place], [place] [address] No appointment is necessary.   Labs are drawn by [facility].  You may receive a bill from [facility] for your lab work.  If you wish to have your labs drawn at another location, please call the office 24 hours in advance to send orders.  If you have any questions regarding directions or hours of operation,  please call [phone].   Just as a reminder please drink plenty of water prior to coming for your lab work. Thanks!

## 2019-08-29 ENCOUNTER — Other Ambulatory Visit: Payer: Self-pay | Admitting: Rheumatology

## 2019-08-29 DIAGNOSIS — M0579 Rheumatoid arthritis with rheumatoid factor of multiple sites without organ or systems involvement: Secondary | ICD-10-CM

## 2019-08-31 NOTE — Telephone Encounter (Signed)
Last Visit: 06/04/2019 Next Visit: 11/05/2019 Labs: 05/15/2019 Glucose and LFTs are high. WBC is low. We will recheck labs in 3 months. PLQ eye exam: 03/12/2019 Normal  Patient advised she is due to update labs. Patient will update them this week.   Okay to refill PLQ?

## 2019-08-31 NOTE — Telephone Encounter (Signed)
We will refill Plaquenil after lab results. White cell count was low.

## 2019-09-01 ENCOUNTER — Other Ambulatory Visit: Payer: Self-pay

## 2019-09-01 DIAGNOSIS — Z79899 Other long term (current) drug therapy: Secondary | ICD-10-CM

## 2019-09-01 NOTE — Telephone Encounter (Signed)
Patient contacted the office to inquire about having labs done with PCP or at lab in [place]. Attempted to contact the patient and left message for patient to call the office.

## 2019-09-02 LAB — CBC WITH DIFFERENTIAL/PLATELET
Absolute Monocytes: 248 cells/uL (ref 200–950)
Basophils Absolute: 50 cells/uL (ref 0–200)
Basophils Relative: 1.5 %
Eosinophils Absolute: 287 cells/uL (ref 15–500)
Eosinophils Relative: 8.7 %
HCT: 41.5 % (ref 35.0–45.0)
Hemoglobin: 12.9 g/dL (ref 11.7–15.5)
Lymphs Abs: 1135 cells/uL (ref 850–3900)
MCH: 25.3 pg — ABNORMAL LOW (ref 27.0–33.0)
MCHC: 31.1 g/dL — ABNORMAL LOW (ref 32.0–36.0)
MCV: 81.5 fL (ref 80.0–100.0)
MPV: 10.7 fL (ref 7.5–12.5)
Monocytes Relative: 7.5 %
Neutro Abs: 1581 cells/uL (ref 1500–7800)
Neutrophils Relative %: 47.9 %
Platelets: 191 10*3/uL (ref 140–400)
RBC: 5.09 10*6/uL (ref 3.80–5.10)
RDW: 16.2 % — ABNORMAL HIGH (ref 11.0–15.0)
Total Lymphocyte: 34.4 %
WBC: 3.3 10*3/uL — ABNORMAL LOW (ref 3.8–10.8)

## 2019-09-02 LAB — COMPLETE METABOLIC PANEL WITH GFR
AG Ratio: 1.4 (calc) (ref 1.0–2.5)
ALT: 26 U/L (ref 6–29)
AST: 33 U/L (ref 10–35)
Albumin: 3.9 g/dL (ref 3.6–5.1)
Alkaline phosphatase (APISO): 50 U/L (ref 37–153)
BUN: 10 mg/dL (ref 7–25)
CO2: 22 mmol/L (ref 20–32)
Calcium: 9.2 mg/dL (ref 8.6–10.4)
Chloride: 107 mmol/L (ref 98–110)
Creat: 0.69 mg/dL (ref 0.50–0.99)
GFR, Est African American: 110 mL/min/{1.73_m2} (ref >=60)
GFR, Est Non African American: 95 mL/min/{1.73_m2} (ref >=60)
Globulin: 2.7 g/dL (calc) (ref 1.9–3.7)
Glucose, Bld: 110 mg/dL — ABNORMAL HIGH (ref 65–99)
Potassium: 4.2 mmol/L (ref 3.5–5.3)
Sodium: 141 mmol/L (ref 135–146)
Total Bilirubin: 0.4 mg/dL (ref 0.2–1.2)
Total Protein: 6.6 g/dL (ref 6.1–8.1)

## 2019-09-02 MED ORDER — HYDROXYCHLOROQUINE SULFATE 200 MG PO TABS: ORAL_TABLET | ORAL | 0 refills | Status: DC

## 2019-09-02 NOTE — Progress Notes (Signed)
CMP is normal.  White cell count is low.  Patient was clinically doing well.  He is advised her to reduce Plaquenil to 1 tablet p.o. twice daily Monday to Friday only.  She should have repeat CBC in 1 month.

## 2019-09-02 NOTE — Addendum Note (Signed)
Addended by: Carole Binning on: 09/02/2019 10:22 AM   Modules accepted: Orders

## 2019-09-02 NOTE — Addendum Note (Signed)
Addended by: Carole Binning on: 09/02/2019 12:41 PM   Modules accepted: Orders

## 2019-09-02 NOTE — Telephone Encounter (Signed)
Based on her lab results and neutropenia, please change the dose of Plaquenil to 200 mg p.o. twice daily Monday to Friday.

## 2019-09-02 NOTE — Telephone Encounter (Signed)
Patient had labs on 09/01/2019   WBC 3.3, MCH 25.3, MCHC 31.1, RDW 16.2, Glucose 110  Okay to refill PLQ?

## 2019-09-07 ENCOUNTER — Other Ambulatory Visit: Payer: Self-pay | Admitting: Cardiovascular Disease

## 2019-09-19 ENCOUNTER — Other Ambulatory Visit: Payer: Self-pay | Admitting: Cardiovascular Disease

## 2019-10-07 HISTORY — PX: DENTAL SURGERY: SHX609

## 2019-10-23 NOTE — Progress Notes (Signed)
Office Visit Note  Patient: [name]             Date of Birth: 01-Nov-1960           MRN: [id number]             PCP: Mateo Flow, MD Referring: Mateo Flow, MD Visit Date: 11/05/2019 Occupation: @GUAROCC @  Subjective:  Other (bilateral hand stiffness/pain )   History of Present Illness: [name] is a 59 y.o. female with history of rheumatoid arthritis and osteoarthritis.  She states she has been experiencing increased fatigue lately.  She also has a stiffness in her hands in the morning.  She states maybe her bilateral fifth fingers swell occasionally.  She also continues to have some muscle pain.  Her knee joint discomfort is tolerable.  Activities of Daily Living:  Patient reports morning stiffness for  1 hour.   Patient Denies nocturnal pain.  Difficulty dressing/grooming: Denies Difficulty climbing stairs: Reports Difficulty getting out of chair: Denies Difficulty using hands for taps, buttons, cutlery, and/or writing: Reports  Review of Systems  Constitutional: Positive for fatigue.  HENT: Negative for mouth sores, mouth dryness and nose dryness.   Eyes: Positive for dryness. Negative for pain and itching.  Respiratory: Negative for shortness of breath, wheezing and difficulty breathing.   Cardiovascular: Negative for chest pain and palpitations.  Gastrointestinal: Negative for blood in stool, constipation and diarrhea.  Endocrine: Negative for increased urination.  Genitourinary: Negative for difficulty urinating and painful urination.  Musculoskeletal: Positive for arthralgias, joint pain, joint swelling, myalgias, morning stiffness, muscle tenderness and myalgias.  Skin: Negative for color change, rash and redness.  Allergic/Immunologic: Negative for susceptible to infections.  Neurological: Positive for headaches. Negative for dizziness, numbness, memory loss and weakness.  Hematological: Positive for bruising/bleeding tendency.    Psychiatric/Behavioral: Negative for confusion and sleep disturbance.    PMFS History:  Patient Active Problem List   Diagnosis Date Noted  . Leg edema 08/19/2018  . Presence of left artificial knee joint 08/14/2016  . Unilateral primary osteoarthritis, left knee 07/31/2016  . Status post total knee replacement, left 07/31/2016  . Abdominal hematoma   . Post-operative state   . Paroxysmal atrial fibrillation (HCC)   . Controlled type 2 diabetes mellitus with diabetic nephropathy ([place])   . Wound infection 01/25/2016  . Hydronephrosis, left 01/25/2016  . Hydronephrosis, right 01/25/2016  . DVT (deep venous thrombosis) ([place]) 01/25/2016  . Atrial fibrillation ([place]) 01/25/2016  . Diabetes mellitus with complication ([place])   . Wound infection after surgery, initial encounter   . Varicose veins of lower extremities with complications 26/83/4196  . Pain and swelling of left lower leg 10/27/2013  . Pain of left lower extremity 10/27/2013  . Gastroenteritis, acute 09/08/2013  . Fever, unspecified 09/04/2013  . ASD (atrial septal defect) 08/13/2013  . Pulmonary embolism ([place]) 06/22/2013  . Dyspnea 06/11/2013  . Anterior slip repaired Oct 2012 01/18/2011  . GI bleed 12/18/2010  . Diabetes mellitus ([place]) 12/18/2010  . Varicose veins of lower extremities with other complications 22/29/7989  . Diabetes mellitus   . HIP PAIN, RIGHT 03/06/2010    Past Medical History:  Diagnosis Date  . Anemia   . Arthritis   . Atrial fibrillation ([place])   . Bleeding ulcer 12-2010  hospitalized for 1 week  . Blood transfusion   . Blood transfusion without reported diagnosis    2011-2 units transfused, bleeding ulcer  . Bruises easily   . CHF (  congestive heart failure) ([place])    with PE resolved after   . Constipation   . Difficulty in swallowing 03-24-13   no longer a problem  . Diverticulitis   . DVT (deep venous thrombosis) ([place]) 06-2013   right leg  . Dysrhythmia     Hx of A fib   . GERD  (gastroesophageal reflux disease)   . Hyperlipidemia   . Leg swelling 03-24-13   not a problem any longer  . Peripheral vascular disease ([place])   . Pulmonary embolus ([place]) 06-2013  . Reflux   . Shortness of breath    with PE  . Vomiting 03-24-13   problem resolved after lap band revision    Family History  Problem Relation Age of Onset  . Heart disease Father   . Kidney disease Father   . Peripheral vascular disease Father   . Heart attack Father        46  . Diabetes Mother   . Arthritis Mother    Past Surgical History:  Procedure Laterality Date  . APPENDECTOMY    . CARPEL TUNNEL BILATEREAL    . CHOLECYSTECTOMY     5'12  . COLONOSCOPY N/A 04/10/2013   Procedure: COLONOSCOPY;  Surgeon: Beryle Beams, MD;  Location: [facility];  Service: Endoscopy;  Laterality: N/A;  . DENTAL SURGERY  10/2019   tooth implant  . DIAGNOSTIC LAPAROSCOPY    . ENDOVENOUS ABLATION SAPHENOUS VEIN W/ LASER  06-2008  . ENDOVENOUS ABLATION SAPHENOUS VEIN W/ LASER Left 12-09-2013   EVLA LEFT GREATER SAPHENOUS VEIN BY TODD EARLY MD  . ESOPHAGOGASTRODUODENOSCOPY  12/18/2010   Procedure: ESOPHAGOGASTRODUODENOSCOPY (EGD);  Surgeon: Gatha Mayer, MD;  Location: [facility];  Service: Endoscopy;  Laterality: N/A;  . JOINT REPLACEMENT    . LAPAROSCOPIC GASTRIC BANDING  01-05-2008  . LAPAROSCOPIC GASTRIC BANDING  12/18/2010   01/04/2009  . SALPHINGOOOPHORECTOMY Right    cyst removal  . TONSILLECTOMY    . TOTAL KNEE ARTHROPLASTY Left 07/31/2016   Procedure: LEFT TOTAL KNEE ARTHROPLASTY;  Surgeon: [name], MD;  Location: [facility];  Service: Orthopedics;  Laterality: Left;  . WEDGE RESECTION OF OVARY  LEFT OVARY    Social History   Social History Narrative  . Not on file   Immunization History  Administered Date(s) Administered  . Influenza Split 10/06/2012  . Influenza,inj,Quad PF,6+ Mos 01/30/2016  . Moderna SARS-COVID-2 Vaccination 09/07/2019, 10/09/2019  . Pneumococcal  Conjugate-13 11/29/2015     Objective: Vital Signs: BP 134/84 (BP Location: Left Wrist, Patient Position: Sitting, Cuff Size: Normal)   Pulse 64   Resp 17   Ht 5\' 6"  (1.676 m)   Wt (!) 325 lb 9.6 oz (147.7 kg)   BMI 52.55 kg/m    Physical Exam Vitals and nursing note reviewed.  Constitutional:      Appearance: She is well-developed.  HENT:     Head: Normocephalic and atraumatic.  Eyes:     Conjunctiva/sclera: Conjunctivae normal.  Cardiovascular:     Rate and Rhythm: Normal rate and regular rhythm.     Heart sounds: Normal heart sounds.  Pulmonary:     Effort: Pulmonary effort is normal.     Breath sounds: Normal breath sounds.  Abdominal:     General: Bowel sounds are normal.     Palpations: Abdomen is soft.  Musculoskeletal:     Cervical back: Normal range of motion.  Lymphadenopathy:     Cervical: No cervical adenopathy.  Skin:  General: Skin is warm and dry.     Capillary Refill: Capillary refill takes less than 2 seconds.  Neurological:     Mental Status: She is alert and oriented to person, place, and time.  Psychiatric:        Behavior: Behavior normal.      Musculoskeletal Exam: C-spine was in good range of motion.  She has some discomfort in her lower back.  Shoulder joints, elbow joints, wrist joints, MCPs PIPs and DIPs with good range of motion with no synovitis.  Hip joints, knee joints, ankles, MTPs and PIPs and DIPs with good range of motion with no synovitis.  CDAI Exam: CDAI Score: 0.4  Patient Global: 2 mm; Provider Global: 2 mm Swollen: 0 ; Tender: 0  Joint Exam 11/05/2019   No joint exam has been documented for this visit   There is currently no information documented on the homunculus. Go to the Rheumatology activity and complete the homunculus joint exam.  Investigation: No additional findings.  Imaging: No results found.  Recent Labs: Lab Results  Component Value Date   WBC 3.3 (L) 09/01/2019   HGB 12.9 09/01/2019   PLT 191  09/01/2019   NA 141 09/01/2019   K 4.2 09/01/2019   CL 107 09/01/2019   CO2 22 09/01/2019   GLUCOSE 110 (H) 09/01/2019   BUN 10 09/01/2019   CREATININE 0.69 09/01/2019   BILITOT 0.4 09/01/2019   ALKPHOS 58 12/14/2018   AST 33 09/01/2019   ALT 26 09/01/2019   PROT 6.6 09/01/2019   ALBUMIN 3.4 (L) 12/14/2018   CALCIUM 9.2 09/01/2019   GFRAA 110 09/01/2019    Speciality Comments: PLQ eye exam: 03/12/2019 Normal. [facility]. Follow up in 1 year.  Procedures:  No procedures performed Allergies: Contrast media [iodinated diagnostic agents], Penicillins, and Ancef [cefazolin sodium]   Assessment / Plan:     Visit Diagnoses: Rheumatoid arthritis with rheumatoid factor of multiple sites without organ or systems involvement (HCC) - Positive rheumatoid factor, negative ANA, negative CCP: She complains of increased pain and stiffness in her hands.  I did not see any synovitis on examination.  I will schedule ultrasound of bilateral hands to evaluate further.  High risk medication use - Plaquenil 200 mg p.o. twice daily.  Her labs are stable.  She has mild neutropenia we will continue to observe.  LFTs have normalized.  Her eye examination is up-to-date.  Prescription refill for Plaquenil was given.  S/P total knee replacement, left - done by Dr. Ninfa Linden in June 2018.  Doing well  Primary osteoarthritis of right knee-she has off-and-on discomfort in her knee joint.  BMI-52.55.  Weight loss diet and exercise was emphasized.  Increased risk of heart disease with rheumatoid arthritis was also discussed.  She has been followed by cardiologist.  Other medical problems are listed as follows: ASD (atrial septal defect)  Paroxysmal atrial fibrillation ([place])  Controlled type 2 diabetes mellitus with diabetic nephropathy, unspecified whether long term insulin use (HCC)  Hydronephrosis, right  Hydronephrosis, left  History of DVT (deep vein thrombosis)  History of diverticulitis  Varicose  veins of left lower extremity with complications  Septic pulmonary embolism with acute cor pulmonale, unspecified chronicity ([place])  Educated about COVID-19 virus infection-she is fully vaccinated against COVID-19.  Use of mask, social distancing and hand hygiene was discussed.  I also discussed possibility of monoclonal antibody infusion in case she develops COVID-19 infection.  Orders: No orders of the defined types were placed in  this encounter.  Meds ordered this encounter  Medications  . hydroxychloroquine (PLAQUENIL) 200 MG tablet    Sig: Take 1 tablet 200 mg BID Monday-Friday    Dispense:  120 tablet    Refill:  0     Follow-Up Instructions: Return in about 5 months (around 04/04/2020) for Rheumatoid arthritis.   Bo Merino, MD  Note - This record has been created using Editor, commissioning.  Chart creation errors have been sought, but may not always  have been located. Such creation errors do not reflect on  the standard of medical care.

## 2019-10-24 ENCOUNTER — Other Ambulatory Visit: Payer: Self-pay | Admitting: Rheumatology

## 2019-10-30 DIAGNOSIS — E669 Obesity, unspecified: Secondary | ICD-10-CM | POA: Insufficient documentation

## 2019-11-05 ENCOUNTER — Other Ambulatory Visit: Payer: Self-pay

## 2019-11-05 ENCOUNTER — Encounter: Payer: Self-pay | Admitting: Rheumatology

## 2019-11-05 ENCOUNTER — Ambulatory Visit (INDEPENDENT_AMBULATORY_CARE_PROVIDER_SITE_OTHER): Payer: No Typology Code available for payment source | Admitting: Rheumatology

## 2019-11-05 VITALS — BP 134/84 | HR 64 | Resp 17 | Ht 66.0 in | Wt 325.6 lb

## 2019-11-05 DIAGNOSIS — I83892 Varicose veins of left lower extremities with other complications: Secondary | ICD-10-CM

## 2019-11-05 DIAGNOSIS — Z8719 Personal history of other diseases of the digestive system: Secondary | ICD-10-CM

## 2019-11-05 DIAGNOSIS — M1711 Unilateral primary osteoarthritis, right knee: Secondary | ICD-10-CM | POA: Diagnosis not present

## 2019-11-05 DIAGNOSIS — Q211 Atrial septal defect, unspecified: Secondary | ICD-10-CM

## 2019-11-05 DIAGNOSIS — M0579 Rheumatoid arthritis with rheumatoid factor of multiple sites without organ or systems involvement: Secondary | ICD-10-CM | POA: Diagnosis not present

## 2019-11-05 DIAGNOSIS — Z79899 Other long term (current) drug therapy: Secondary | ICD-10-CM | POA: Diagnosis not present

## 2019-11-05 DIAGNOSIS — E1121 Type 2 diabetes mellitus with diabetic nephropathy: Secondary | ICD-10-CM

## 2019-11-05 DIAGNOSIS — Z96652 Presence of left artificial knee joint: Secondary | ICD-10-CM | POA: Diagnosis not present

## 2019-11-05 DIAGNOSIS — I480 Paroxysmal atrial fibrillation: Secondary | ICD-10-CM

## 2019-11-05 DIAGNOSIS — N1330 Unspecified hydronephrosis: Secondary | ICD-10-CM

## 2019-11-05 DIAGNOSIS — I2601 Septic pulmonary embolism with acute cor pulmonale: Secondary | ICD-10-CM

## 2019-11-05 DIAGNOSIS — Z7189 Other specified counseling: Secondary | ICD-10-CM

## 2019-11-05 DIAGNOSIS — Z86718 Personal history of other venous thrombosis and embolism: Secondary | ICD-10-CM

## 2019-11-05 DIAGNOSIS — Z6841 Body Mass Index (BMI) 40.0 and over, adult: Secondary | ICD-10-CM

## 2019-11-05 MED ORDER — HYDROXYCHLOROQUINE SULFATE 200 MG PO TABS: ORAL_TABLET | ORAL | 0 refills | Status: DC

## 2019-11-05 NOTE — Patient Instructions (Addendum)
COVID-19 vaccine recommendations:   COVID-19 vaccine is recommended for everyone (unless you are allergic to a vaccine component), even if you are on a medication that suppresses your immune system.   If you are on Methotrexate, Cellcept (mycophenolate), Rinvoq, Mor[facility], and Olumiant- hold the medication for 1 week after each vaccine. Hold Methotrexate for 2 weeks after the single dose COVID-19 vaccine.   If you are on Orencia subcutaneous injection - hold medication one week prior to and one week after the first COVID-19 vaccine dose (only).   If you are on Orencia IV infusions- time vaccination administration so that the first COVID-19 vaccination will occur four weeks after the infusion and postpone the subsequent infusion by one week.   If you are on Cyclophosphamide or Rituxan infusions please contact your doctor prior to receiving the COVID-19 vaccine.   Do not take Tylenol or any anti-inflammatory medications (NSAIDs) 24 hours prior to the COVID-19 vaccination.   There is no direct evidence about the efficacy of the COVID-19 vaccine in individuals who are on medications that suppress the immune system.   Even if you are fully vaccinated, and you are on any medications that suppress your immune system, please continue to wear a mask, maintain at least six feet social distance and practice hand hygiene.   If you develop a COVID-19 infection, please contact your PCP or our office to determine if you need antibody infusion.  The booster vaccine is now available for immunocompromised patients. It is advised that if you had Pfizer vaccine you should get Coca-Cola booster.  If you had a Moderna vaccine then you should get a Moderna booster. Johnson and Wynetta Emery does not have a booster vaccine at this time.  Please see the following web sites for updated information.   https://[email or web address].org/Portals/0/Files/COVID-19-Vaccination-Patient-Resources.pdf    Hand Exercises Hand exercises can  be helpful for almost anyone. These exercises can strengthen the hands, improve flexibility and movement, and increase blood flow to the hands. These results can make work and daily tasks easier. Hand exercises can be especially helpful for people who have joint pain from arthritis or have nerve damage from overuse (carpal tunnel syndrome). These exercises can also help people who have injured a hand. Exercises Most of these hand exercises are gentle stretching and motion exercises. It is usually safe to do them often throughout the day. Warming up your hands before exercise may help to reduce stiffness. You can do this with gentle massage or by placing your hands in warm water for 10-15 minutes. It is normal to feel some stretching, pulling, tightness, or mild discomfort as you begin new exercises. This will gradually improve. Stop an exercise right away if you feel sudden, severe pain or your pain gets worse. Ask your health care provider which exercises are best for you. Knuckle bend or "claw" fist 1. Stand or sit with your arm, hand, and all five fingers pointed straight up. Make sure to keep your wrist straight during the exercise. 2. Gently bend your fingers down toward your palm until the tips of your fingers are touching the top of your palm. Keep your big knuckle straight and just bend the small knuckles in your fingers. 3. Hold this position for __________ seconds. 4. Straighten (extend) your fingers back to the starting position. Repeat this exercise 5-10 times with each hand. Full finger fist 1. Stand or sit with your arm, hand, and all five fingers pointed straight up. Make sure to keep your wrist straight during the exercise.  2. Gently bend your fingers into your palm until the tips of your fingers are touching the middle of your palm. 3. Hold this position for __________ seconds. 4. Extend your fingers back to the starting position, stretching every joint fully. Repeat this exercise  5-10 times with each hand. Straight fist 1. Stand or sit with your arm, hand, and all five fingers pointed straight up. Make sure to keep your wrist straight during the exercise. 2. Gently bend your fingers at the big knuckle, where your fingers meet your hand, and the middle knuckle. Keep the knuckle at the tips of your fingers straight and try to touch the bottom of your palm. 3. Hold this position for __________ seconds. 4. Extend your fingers back to the starting position, stretching every joint fully. Repeat this exercise 5-10 times with each hand. Tabletop 1. Stand or sit with your arm, hand, and all five fingers pointed straight up. Make sure to keep your wrist straight during the exercise. 2. Gently bend your fingers at the big knuckle, where your fingers meet your hand, as far down as you can while keeping the small knuckles in your fingers straight. Think of forming a tabletop with your fingers. 3. Hold this position for __________ seconds. 4. Extend your fingers back to the starting position, stretching every joint fully. Repeat this exercise 5-10 times with each hand. Finger spread 1. Place your hand flat on a table with your palm facing down. Make sure your wrist stays straight as you do this exercise. 2. Spread your fingers and thumb apart from each other as far as you can until you feel a gentle stretch. Hold this position for __________ seconds. 3. Bring your fingers and thumb tight together again. Hold this position for __________ seconds. Repeat this exercise 5-10 times with each hand. Making circles 1. Stand or sit with your arm, hand, and all five fingers pointed straight up. Make sure to keep your wrist straight during the exercise. 2. Make a circle by touching the tip of your thumb to the tip of your index finger. 3. Hold for __________ seconds. Then open your hand wide. 4. Repeat this motion with your thumb and each finger on your hand. Repeat this exercise 5-10 times  with each hand. Thumb motion 1. Sit with your forearm resting on a table and your wrist straight. Your thumb should be facing up toward the ceiling. Keep your fingers relaxed as you move your thumb. 2. Lift your thumb up as high as you can toward the ceiling. Hold for __________ seconds. 3. Bend your thumb across your palm as far as you can, reaching the tip of your thumb for the small finger (pinkie) side of your palm. Hold for __________ seconds. Repeat this exercise 5-10 times with each hand. Grip strengthening  1. Hold a stress ball or other soft ball in the middle of your hand. 2. Slowly increase the pressure, squeezing the ball as much as you can without causing pain. Think of bringing the tips of your fingers into the middle of your palm. All of your finger joints should bend when doing this exercise. 3. Hold your squeeze for __________ seconds, then relax. Repeat this exercise 5-10 times with each hand. Contact a health care provider if:  Your hand pain or discomfort gets much worse when you do an exercise.  Your hand pain or discomfort does not improve within 2 hours after you exercise. If you have any of these problems, stop doing these exercises right  away. Do not do them again unless your health care provider says that you can. Get help right away if:  You develop sudden, severe hand pain or swelling. If this happens, stop doing these exercises right away. Do not do them again unless your health care provider says that you can. This information is not intended to replace advice given to you by your health care provider. Make sure you discuss any questions you have with your health care provider. Document Revised: 05/15/2018 Document Reviewed: 01/23/2018 Elsevier Patient Education  [place] We placed an order today for your standing lab work.   Please have your standing labs drawn in December  If possible, please have your labs drawn 2 weeks prior  to your appointment so that the provider can discuss your results at your  appointment.  We have open lab daily Monday through Thursday from 8:30-12:30 PM and 1:30-4:30 PM and Friday from 8:30-12:30 PM and 1:30-4:00 PM at the office of Dr. Bo Merino, [facility].   Please be advised, patients with office appointments requiring lab work will take precedents over walk-in lab work.  If possible, please come for your lab work on Monday and Friday afternoons, as you may experience shorter wait times. The office is located at [address] [place], [place], [place] [address] No appointment is necessary.   Labs are drawn by [facility]. Please bring your co-pay at the time of your lab draw.  You may receive a bill from [facility] for your lab work.  If you wish to have your labs drawn at another location, please call the office 24 hours in advance to send orders.  If you have any questions regarding directions or hours of operation,  please call [phone].   As a reminder, please drink plenty of water prior to coming for your lab work. Thanks!

## 2019-12-09 ENCOUNTER — Other Ambulatory Visit: Payer: Self-pay | Admitting: Rheumatology

## 2019-12-09 NOTE — Telephone Encounter (Signed)
Last Visit: 11/05/2019 Next Visit: 01/27/2020 Labs: 09/01/2019 CMP is normal. White cell count is low Eye exam: 03/12/2019 Normal  Current Dose per office note 11/05/2019: Plaquenil 200 mg p.o. twice daily DX: Rheumatoid arthritis with rheumatoid factor of multiple sites without organ or systems involvement   Per lab note on 09/01/2019: reduce Plaquenil to 1 tablet p.o. twice daily Monday to Friday only  Okay to refill Plaquenil and at which dose?

## 2019-12-09 NOTE — Telephone Encounter (Signed)
Ok to refill plaquenil at reduced dose of 200 mg 1 tablet by mouth twice daily Monday through Friday only. Please advise the patient to return in December to have lab work updated.  We will continue to monitor WBC count closely.

## 2020-01-13 ENCOUNTER — Other Ambulatory Visit: Payer: No Typology Code available for payment source | Admitting: Rheumatology

## 2020-01-27 ENCOUNTER — Other Ambulatory Visit: Payer: No Typology Code available for payment source | Admitting: Rheumatology

## 2020-01-27 ENCOUNTER — Ambulatory Visit: Payer: No Typology Code available for payment source | Admitting: Orthopaedic Surgery

## 2020-02-09 ENCOUNTER — Other Ambulatory Visit: Payer: No Typology Code available for payment source | Admitting: Rheumatology

## 2020-02-10 ENCOUNTER — Ambulatory Visit (INDEPENDENT_AMBULATORY_CARE_PROVIDER_SITE_OTHER): Payer: No Typology Code available for payment source | Admitting: Orthopaedic Surgery

## 2020-02-10 DIAGNOSIS — M7061 Trochanteric bursitis, right hip: Secondary | ICD-10-CM | POA: Diagnosis not present

## 2020-02-10 DIAGNOSIS — G8929 Other chronic pain: Secondary | ICD-10-CM | POA: Diagnosis not present

## 2020-02-10 DIAGNOSIS — M25561 Pain in right knee: Secondary | ICD-10-CM | POA: Diagnosis not present

## 2020-02-10 MED ORDER — LIDOCAINE HCL 1 % IJ SOLN
3.0000 mL | INTRAMUSCULAR | Status: AC | PRN
  Administered 2020-02-10: 3 mL

## 2020-02-10 MED ORDER — METHYLPREDNISOLONE ACETATE 40 MG/ML IJ SUSP
40.0000 mg | INTRAMUSCULAR | Status: AC | PRN
  Administered 2020-02-10: 40 mg via INTRA_ARTICULAR

## 2020-02-10 NOTE — Progress Notes (Signed)
Office Visit Note   Patient: [name]           Date of Birth: [birth year]           MRN: [id number] Visit Date: 02/10/2020              Requested by: Mateo Flow, MD [place],  [place] [address] PCP: Mateo Flow, MD   Assessment & Plan: Visit Diagnoses:  1. Trochanteric bursitis, right hip   2. Chronic pain of right knee     Plan: She is thinking about considering a knee replacement surgery for the right knee sometime maybe in the summertime.  She did require steroid injections in her right knee and her right hip trochanteric area today which she tolerated well.  All questions and concerns were answered and addressed.  Follow-up for now can be as needed.  Follow-Up Instructions: Return if symptoms worsen or fail to improve.   Orders:  Orders Placed This Encounter  Procedures  . Large Joint Inj  . Large Joint Inj   No orders of the defined types were placed in this encounter.     Procedures: Large Joint Inj: R knee on 02/10/2020 8:30 AM Indications: diagnostic evaluation and pain Details: 22 G 1.5 in needle, superolateral approach  Arthrogram: No  Medications: 3 mL lidocaine 1 %; 40 mg methylPREDNISolone acetate 40 MG/ML Outcome: tolerated well, no immediate complications Procedure, treatment alternatives, risks and benefits explained, specific risks discussed. Consent was given by the patient. Immediately prior to procedure a time out was called to verify the correct patient, procedure, equipment, support staff and site/side marked as required. Patient was prepped and draped in the usual sterile fashion.   Large Joint Inj: R greater trochanter on 02/10/2020 8:30 AM Indications: pain and diagnostic evaluation Details: 22 G 1.5 in needle, lateral approach  Arthrogram: No  Medications: 3 mL lidocaine 1 %; 40 mg methylPREDNISolone acetate 40 MG/ML Outcome: tolerated well, no immediate complications Procedure, treatment alternatives, risks and  benefits explained, specific risks discussed. Consent was given by the patient. Immediately prior to procedure a time out was called to verify the correct patient, procedure, equipment, support staff and site/side marked as required. Patient was prepped and draped in the usual sterile fashion.       Clinical Data: No additional findings.   Subjective: Chief Complaint  Patient presents with  . Right Hip - Follow-up, Pain  . Right Knee - Follow-up, Pain  [name] comes in today requesting steroid injections in her right knee as well as her right trochanteric area.  We have injected these areas before.  A MRI of her right knee was reviewed with her.  This was done in March of last year.  This shows some areas of full-thickness cartilage loss in the weightbearing surface of her knee medially in the trochlea groove.  There was no significant meniscal tearing.  She has had a fall recently and injured that knee as well as the knee that we replaced.  She reports mainly bruising.  She is also been having pain over the lateral aspect of her hip.  She has had trochanteric bursitis in the past.  She said no other acute change in her medical status.  HPI  Review of Systems She currently denies any headache, chest pain, shortness of breath, fever, chills, nausea, vomiting  Objective: Vital Signs: There were no vitals taken for this visit.  Physical Exam She is alert and orient x3 and  in no acute distress Ortho Exam Examination of her right hip shows no pain in the groin with good internal and external rotation with pain over the trochanteric area to palpation.  Examination of her right knee shows bruising medially but the knee is ligamentously stable with no effusion and good range of motion. Specialty Comments:  No specialty comments available.  Imaging: No results found.   PMFS History: Patient Active Problem List   Diagnosis Date Noted  . Leg edema 08/19/2018  . Presence of left artificial  knee joint 08/14/2016  . Unilateral primary osteoarthritis, left knee 07/31/2016  . Status post total knee replacement, left 07/31/2016  . Abdominal hematoma   . Post-operative state   . Paroxysmal atrial fibrillation (HCC)   . Controlled type 2 diabetes mellitus with diabetic nephropathy (HCC)   . Wound infection 01/25/2016  . Hydronephrosis, left 01/25/2016  . Hydronephrosis, right 01/25/2016  . DVT (deep venous thrombosis) (HCC) 01/25/2016  . Atrial fibrillation (HCC) 01/25/2016  . Diabetes mellitus with complication (HCC)   . Wound infection after surgery, initial encounter   . Varicose veins of lower extremities with complications 12/09/2013  . Pain and swelling of left lower leg 10/27/2013  . Pain of left lower extremity 10/27/2013  . Gastroenteritis, acute 09/08/2013  . Fever, unspecified 09/04/2013  . ASD (atrial septal defect) 08/13/2013  . Pulmonary embolism (HCC) 06/22/2013  . Dyspnea 06/11/2013  . Anterior slip repaired Oct 2012 01/18/2011  . GI bleed 12/18/2010  . Diabetes mellitus (HCC) 12/18/2010  . Varicose veins of lower extremities with other complications 07/20/2010  . Diabetes mellitus   . HIP PAIN, RIGHT 03/06/2010   Past Medical History:  Diagnosis Date  . Anemia   . Arthritis   . Atrial fibrillation (HCC)   . Bleeding ulcer 12-2010  hospitalized for 1 week  . Blood transfusion   . Blood transfusion without reported diagnosis    2011-2 units transfused, bleeding ulcer  . Bruises easily   . CHF (congestive heart failure) (HCC)    with PE resolved after   . Constipation   . Difficulty in swallowing 03-24-13   no longer a problem  . Diverticulitis   . DVT (deep venous thrombosis) (HCC) 06-2013   right leg  . Dysrhythmia     Hx of A fib   . GERD (gastroesophageal reflux disease)   . Hyperlipidemia   . Leg swelling 03-24-13   not a problem any longer  . Peripheral vascular disease (HCC)   . Pulmonary embolus (HCC) 06-2013  . Reflux   . Shortness  of breath    with PE  . Vomiting 03-24-13   problem resolved after lap band revision    Family History  Problem Relation Age of Onset  . Heart disease Father   . Kidney disease Father   . Peripheral vascular disease Father   . Heart attack Father        54  . Diabetes Mother   . Arthritis Mother     Past Surgical History:  Procedure Laterality Date  . APPENDECTOMY    . CARPEL TUNNEL BILATEREAL    . CHOLECYSTECTOMY     5'12  . COLONOSCOPY N/A 04/10/2013   Procedure: COLONOSCOPY;  Surgeon: Theda Belfast, MD;  Location: [facility];  Service: Endoscopy;  Laterality: N/A;  . DENTAL SURGERY  10/2019   tooth implant  . DIAGNOSTIC LAPAROSCOPY    . ENDOVENOUS ABLATION SAPHENOUS VEIN W/ LASER  06-2008  . ENDOVENOUS ABLATION SAPHENOUS  VEIN W/ LASER Left 12-09-2013   EVLA LEFT GREATER SAPHENOUS VEIN BY TODD EARLY MD  . ESOPHAGOGASTRODUODENOSCOPY  12/18/2010   Procedure: ESOPHAGOGASTRODUODENOSCOPY (EGD);  Surgeon: Gatha Mayer, MD;  Location: [facility];  Service: Endoscopy;  Laterality: N/A;  . JOINT REPLACEMENT    . LAPAROSCOPIC GASTRIC BANDING  01-05-2008  . LAPAROSCOPIC GASTRIC BANDING  12/18/2010   01/04/2009  . SALPHINGOOOPHORECTOMY Right    cyst removal  . TONSILLECTOMY    . TOTAL KNEE ARTHROPLASTY Left 07/31/2016   Procedure: LEFT TOTAL KNEE ARTHROPLASTY;  Surgeon: [name], MD;  Location: [facility];  Service: Orthopedics;  Laterality: Left;  . WEDGE RESECTION OF OVARY  LEFT OVARY    Social History   Occupational History  . Occupation: Programmer, multimedia: [employer]  Tobacco Use  . Smoking status: Never Smoker  . Smokeless tobacco: Never Used  Vaping Use  . Vaping Use: Never used  Substance and Sexual Activity  . Alcohol use: No    Alcohol/week: 0.0 standard drinks  . Drug use: No  . Sexual activity: Yes    Partners: Male    Birth control/protection: None

## 2020-03-24 NOTE — Progress Notes (Signed)
Office Visit Note  Patient: [name]             Date of Birth: 1960/06/15           MRN: [id number]             PCP: Mateo Flow, MD Referring: Mateo Flow, MD Visit Date: 04/07/2020 Occupation: @GUAROCC @  Subjective:  Chronic right knee joint pain  History of Present Illness: [name] is a 60 y.o. female with history of seropositive rheumatoid arthritis and osteoarthritis.  Patient is taking Plaquenil 200 mg 1 tablet by mouth twice daily Monday through Friday.  Patient reports that 1 month ago she had a flare involving both hands which lasted for about 2 days.  Patient reports that prior to the onset of the flare she had an upper respiratory tract infection which she feels contributed to her fatigue and arthralgias.  She states that she did not take any over-the-counter products or prednisone during the flare.  She continues to have chronic pain in her right knee joint.  She has an upcoming appointment with Dr. Ninfa Linden today to discuss scheduling right knee total arthroplasty.  She states that her left knee replacement is doing well at this time. She denies any recent falls or fractures.  Her gynecologist has been ordering her bone densities.  She continues to take a calcium and vitamin D supplement on a daily basis.      Activities of Daily Living:  Patient reports morning stiffness for 20 minutes.   Patient Denies nocturnal pain.  Difficulty dressing/grooming: Denies Difficulty climbing stairs: Reports Difficulty getting out of chair: Reports Difficulty using hands for taps, buttons, cutlery, and/or writing: Reports  Review of Systems  Constitutional: Positive for fatigue.  HENT: Positive for mouth sores. Negative for mouth dryness and nose dryness.   Eyes: Positive for dryness. Negative for pain, itching and visual disturbance.  Respiratory: Negative for cough, hemoptysis, shortness of breath and difficulty breathing.   Cardiovascular: Positive for  palpitations and swelling in legs/feet. Negative for chest pain.  Gastrointestinal: Negative for abdominal pain, blood in stool, constipation and diarrhea.  Endocrine: Negative for increased urination.  Genitourinary: Negative for painful urination.  Musculoskeletal: Positive for arthralgias, joint pain, joint swelling and morning stiffness. Negative for myalgias, muscle weakness, muscle tenderness and myalgias.  Skin: Negative for color change, rash and redness.  Allergic/Immunologic: Positive for susceptible to infections.  Neurological: Positive for weakness. Negative for dizziness, numbness, headaches and memory loss.  Hematological: Negative for swollen glands.  Psychiatric/Behavioral: Negative for confusion and sleep disturbance.    PMFS History:  Patient Active Problem List   Diagnosis Date Noted  . Leg edema 08/19/2018  . Presence of left artificial knee joint 08/14/2016  . Unilateral primary osteoarthritis, left knee 07/31/2016  . Status post total knee replacement, left 07/31/2016  . Abdominal hematoma   . Post-operative state   . Paroxysmal atrial fibrillation (HCC)   . Controlled type 2 diabetes mellitus with diabetic nephropathy ([place])   . Wound infection 01/25/2016  . Hydronephrosis, left 01/25/2016  . Hydronephrosis, right 01/25/2016  . DVT (deep venous thrombosis) ([place]) 01/25/2016  . Atrial fibrillation ([place]) 01/25/2016  . Diabetes mellitus with complication ([place])   . Wound infection after surgery, initial encounter   . Varicose veins of lower extremities with complications 20/25/4270  . Pain and swelling of left lower leg 10/27/2013  . Pain of left lower extremity 10/27/2013  . Gastroenteritis, acute 09/08/2013  . Fever,  unspecified 09/04/2013  . ASD (atrial septal defect) 08/13/2013  . Pulmonary embolism ([place]) 06/22/2013  . Dyspnea 06/11/2013  . Anterior slip repaired Oct 2012 01/18/2011  . GI bleed 12/18/2010  . Diabetes mellitus ([place]) 12/18/2010  . Varicose  veins of lower extremities with other complications 44/04/4740  . Diabetes mellitus   . HIP PAIN, RIGHT 03/06/2010    Past Medical History:  Diagnosis Date  . Anemia   . Arthritis   . Atrial fibrillation ([place])   . Bleeding ulcer 12-2010  hospitalized for 1 week  . Blood transfusion   . Blood transfusion without reported diagnosis    2011-2 units transfused, bleeding ulcer  . Bruises easily   . CHF (congestive heart failure) ([place])    with PE resolved after   . Constipation   . Difficulty in swallowing 03-24-13   no longer a problem  . Diverticulitis   . DVT (deep venous thrombosis) ([place]) 06-2013   right leg  . Dysrhythmia     Hx of A fib   . GERD (gastroesophageal reflux disease)   . Hyperlipidemia   . Leg swelling 03-24-13   not a problem any longer  . Peripheral vascular disease ([place])   . Pulmonary embolus ([place]) 06-2013  . Reflux   . Shortness of breath    with PE  . Vomiting 03-24-13   problem resolved after lap band revision    Family History  Problem Relation Age of Onset  . Heart disease Father   . Kidney disease Father   . Peripheral vascular disease Father   . Heart attack Father        18  . Diabetes Mother   . Arthritis Mother    Past Surgical History:  Procedure Laterality Date  . APPENDECTOMY    . CARPEL TUNNEL BILATEREAL    . CHOLECYSTECTOMY     5'12  . COLONOSCOPY N/A 04/10/2013   Procedure: COLONOSCOPY;  Surgeon: Beryle Beams, MD;  Location: [facility];  Service: Endoscopy;  Laterality: N/A;  . DENTAL SURGERY  10/2019   tooth implant  . DIAGNOSTIC LAPAROSCOPY    . ENDOVENOUS ABLATION SAPHENOUS VEIN W/ LASER  06-2008  . ENDOVENOUS ABLATION SAPHENOUS VEIN W/ LASER Left 12-09-2013   EVLA LEFT GREATER SAPHENOUS VEIN BY TODD EARLY MD  . ESOPHAGOGASTRODUODENOSCOPY  12/18/2010   Procedure: ESOPHAGOGASTRODUODENOSCOPY (EGD);  Surgeon: Gatha Mayer, MD;  Location: [facility];  Service: Endoscopy;  Laterality: N/A;  . JOINT REPLACEMENT    .  LAPAROSCOPIC GASTRIC BANDING  01-05-2008  . LAPAROSCOPIC GASTRIC BANDING  12/18/2010   01/04/2009  . SALPHINGOOOPHORECTOMY Right    cyst removal  . TONSILLECTOMY    . TOTAL KNEE ARTHROPLASTY Left 07/31/2016   Procedure: LEFT TOTAL KNEE ARTHROPLASTY;  Surgeon: [name], MD;  Location: [facility];  Service: Orthopedics;  Laterality: Left;  . WEDGE RESECTION OF OVARY  LEFT OVARY    Social History   Social History Narrative  . Not on file   Immunization History  Administered Date(s) Administered  . Influenza Split 10/06/2012  . Influenza,inj,Quad PF,6+ Mos 01/30/2016  . Moderna Sars-Covid-2 Vaccination 09/07/2019, 10/09/2019  . Pneumococcal Conjugate-13 11/29/2015     Objective: Vital Signs: BP 122/66 (BP Location: Right Arm, Patient Position: Sitting, Cuff Size: Normal)   Pulse 76   Ht 5\' 6"  (1.676 m)   Wt (!) 327 lb 9.6 oz (148.6 kg)   BMI 52.88 kg/m    Physical Exam Vitals and nursing note reviewed.  Constitutional:  Appearance: She is well-developed and well-nourished.  HENT:     Head: Normocephalic and atraumatic.  Eyes:     Extraocular Movements: EOM normal.     Conjunctiva/sclera: Conjunctivae normal.  Cardiovascular:     Pulses: Intact distal pulses.  Pulmonary:     Effort: Pulmonary effort is normal.  Abdominal:     Palpations: Abdomen is soft.  Musculoskeletal:     Cervical back: Normal range of motion.  Skin:    General: Skin is warm and dry.     Capillary Refill: Capillary refill takes less than 2 seconds.  Neurological:     Mental Status: She is alert and oriented to person, place, and time.  Psychiatric:        Mood and Affect: Mood and affect normal.        Behavior: Behavior normal.      Musculoskeletal Exam: C-spine, thoracic spine, lumbar spine good range of motion with no discomfort.  Shoulder joints, elbow joints, wrist joints, MCPs, PIPs, DIPs have good range of motion with no synovitis.  She has PIP and DIP thickening  consistent with osteoarthritis of both hands.  She is able to make a complete fist bilaterally.  She has painful limited range of motion of the right hip on examination today.  Left knee replacement has good range of motion mild warmth but no effusion.  Right knee has painful range of motion with no warmth or effusion.  Ankle joints have good range of motion with no discomfort.  Pedal edema noted bilaterally.  No tenderness of MTP joints.  CDAI Exam: CDAI Score: 0.4  Patient Global: 2 mm; Provider Global: 2 mm Swollen: 0 ; Tender: 0  Joint Exam 04/07/2020   No joint exam has been documented for this visit   There is currently no information documented on the homunculus. Go to the Rheumatology activity and complete the homunculus joint exam.  Investigation: No additional findings.  Imaging: No results found.  Recent Labs: Lab Results  Component Value Date   WBC 3.3 (L) 09/01/2019   HGB 12.9 09/01/2019   PLT 191 09/01/2019   NA 141 09/01/2019   K 4.2 09/01/2019   CL 107 09/01/2019   CO2 22 09/01/2019   GLUCOSE 110 (H) 09/01/2019   BUN 10 09/01/2019   CREATININE 0.69 09/01/2019   BILITOT 0.4 09/01/2019   ALKPHOS 58 12/14/2018   AST 33 09/01/2019   ALT 26 09/01/2019   PROT 6.6 09/01/2019   ALBUMIN 3.4 (L) 12/14/2018   CALCIUM 9.2 09/01/2019   GFRAA 110 09/01/2019    Speciality Comments: PLQ eye exam: 03/12/2019 Normal. [facility]. Follow up in 1 year.  Procedures:  No procedures performed Allergies: Contrast media [iodinated diagnostic agents], Penicillins, and Ancef [cefazolin sodium]   Assessment / Plan:     Visit Diagnoses: Rheumatoid arthritis with rheumatoid factor of multiple sites without organ or systems involvement ([place]) - Positive rheumatoid factor, negative ANA, negative CCP: She has no joint tenderness or synovitis on exam.  She has not had any recent rheumatoid arthritis flares.  She is clinically doing well taking Plaquenil 200 mg 1 tablet by mouth twice  daily Monday through Friday.  She continues to tolerate Plaquenil without any side effects.  Plan to the patient she had a flare in both hands about 1 month ago which lasted 2 days.  She did not take any over-the-counter products or prednisone during that time.  She had not missed any doses of Plaquenil but had had  a recent upper respiratory tract infection which she feels caused the increased fatigue and arthralgias.  She will continue taking Plaquenil as prescribed.  She was advised to notify us if she develops increased joint pain or joint swelling.  She will follow-up in the office in 5 months.  High risk medication use - Plaquenil 200 mg 1 tablet by mouth twice daily Monday through Friday only.CBC and CMP were drawn on 09/01/2019.  She is due to update lab work.  Orders for CBC and CMP were released.  PLQ eye exam: 03/12/2019 Normal. [facility]. Follow up in 1 year.  She has an upcoming Plaquenil eye exam on 05/12/2020.  She was given a Plaquenil eye exam form to take with her to her upcoming appointment.  - Plan: COMPLETE METABOLIC PANEL WITH GFR, CBC with Differential/Platelet  S/P total knee replacement, left - Performed by Dr. Ninfa Linden in June 2018.  She has good ROM with no discomfort.  Mild warmth but no effusion.    Primary osteoarthritis of right knee: Chronic pain.  She has good range of motion on examination today with no warmth or effusion.  She has an upcoming appointment with Dr. Ninfa Linden today to discuss proceeding with a right knee total arthroplasty.  Other medical conditions are listed as follows:   ASD (atrial septal defect)  Paroxysmal atrial fibrillation (HCC)  Hydronephrosis, right  Hydronephrosis, left  Controlled type 2 diabetes mellitus with diabetic nephropathy, unspecified whether long term insulin use (HCC)  History of DVT (deep vein thrombosis)  History of diverticulitis  Septic pulmonary embolism with acute cor pulmonale, unspecified chronicity  (HCC)  Varicose veins of left lower extremity with complications  Orders: Orders Placed This Encounter  Procedures  . COMPLETE METABOLIC PANEL WITH GFR  . CBC with Differential/Platelet   No orders of the defined types were placed in this encounter.  .  Follow-Up Instructions: Return in about 5 months (around 09/07/2020) for Rheumatoid arthritis.   Ofilia Neas, PA-C  Note - This record has been created using Dragon software.  Chart creation errors have been sought, but may not always  have been located. Such creation errors do not reflect on  the standard of medical care.

## 2020-04-07 ENCOUNTER — Encounter: Payer: Self-pay | Admitting: Rheumatology

## 2020-04-07 ENCOUNTER — Other Ambulatory Visit: Payer: Self-pay

## 2020-04-07 ENCOUNTER — Ambulatory Visit (INDEPENDENT_AMBULATORY_CARE_PROVIDER_SITE_OTHER): Payer: No Typology Code available for payment source

## 2020-04-07 ENCOUNTER — Ambulatory Visit (INDEPENDENT_AMBULATORY_CARE_PROVIDER_SITE_OTHER): Payer: No Typology Code available for payment source | Admitting: Physician Assistant

## 2020-04-07 ENCOUNTER — Ambulatory Visit (INDEPENDENT_AMBULATORY_CARE_PROVIDER_SITE_OTHER): Payer: No Typology Code available for payment source | Admitting: Orthopaedic Surgery

## 2020-04-07 VITALS — BP 122/66 | HR 76 | Ht 66.0 in | Wt 327.6 lb

## 2020-04-07 DIAGNOSIS — E1121 Type 2 diabetes mellitus with diabetic nephropathy: Secondary | ICD-10-CM

## 2020-04-07 DIAGNOSIS — Z79899 Other long term (current) drug therapy: Secondary | ICD-10-CM

## 2020-04-07 DIAGNOSIS — M1711 Unilateral primary osteoarthritis, right knee: Secondary | ICD-10-CM | POA: Diagnosis not present

## 2020-04-07 DIAGNOSIS — Z86718 Personal history of other venous thrombosis and embolism: Secondary | ICD-10-CM

## 2020-04-07 DIAGNOSIS — G8929 Other chronic pain: Secondary | ICD-10-CM | POA: Diagnosis not present

## 2020-04-07 DIAGNOSIS — Z96652 Presence of left artificial knee joint: Secondary | ICD-10-CM | POA: Diagnosis not present

## 2020-04-07 DIAGNOSIS — I480 Paroxysmal atrial fibrillation: Secondary | ICD-10-CM

## 2020-04-07 DIAGNOSIS — I2601 Septic pulmonary embolism with acute cor pulmonale: Secondary | ICD-10-CM

## 2020-04-07 DIAGNOSIS — M25561 Pain in right knee: Secondary | ICD-10-CM

## 2020-04-07 DIAGNOSIS — I83892 Varicose veins of left lower extremities with other complications: Secondary | ICD-10-CM

## 2020-04-07 DIAGNOSIS — Q211 Atrial septal defect, unspecified: Secondary | ICD-10-CM

## 2020-04-07 DIAGNOSIS — Z8719 Personal history of other diseases of the digestive system: Secondary | ICD-10-CM

## 2020-04-07 DIAGNOSIS — M0579 Rheumatoid arthritis with rheumatoid factor of multiple sites without organ or systems involvement: Secondary | ICD-10-CM | POA: Diagnosis not present

## 2020-04-07 DIAGNOSIS — M7061 Trochanteric bursitis, right hip: Secondary | ICD-10-CM | POA: Diagnosis not present

## 2020-04-07 DIAGNOSIS — N1330 Unspecified hydronephrosis: Secondary | ICD-10-CM

## 2020-04-07 LAB — COMPLETE METABOLIC PANEL WITH GFR
AG Ratio: 1.3 (calc) (ref 1.0–2.5)
ALT: 21 U/L (ref 6–29)
AST: 30 U/L (ref 10–35)
Albumin: 3.7 g/dL (ref 3.6–5.1)
Alkaline phosphatase (APISO): 65 U/L (ref 37–153)
BUN: 10 mg/dL (ref 7–25)
CO2: 26 mmol/L (ref 20–32)
Calcium: 8.7 mg/dL (ref 8.6–10.4)
Chloride: 106 mmol/L (ref 98–110)
Creat: 0.66 mg/dL (ref 0.50–0.99)
GFR, Est African American: 111 mL/min/{1.73_m2} (ref >=60)
GFR, Est Non African American: 96 mL/min/{1.73_m2} (ref >=60)
Globulin: 2.8 g/dL (calc) (ref 1.9–3.7)
Glucose, Bld: 117 mg/dL — ABNORMAL HIGH (ref 65–99)
Potassium: 4.4 mmol/L (ref 3.5–5.3)
Sodium: 139 mmol/L (ref 135–146)
Total Bilirubin: 0.4 mg/dL (ref 0.2–1.2)
Total Protein: 6.5 g/dL (ref 6.1–8.1)

## 2020-04-07 LAB — CBC WITH DIFFERENTIAL/PLATELET
Absolute Monocytes: 400 cells/uL (ref 200–950)
Basophils Absolute: 70 cells/uL (ref 0–200)
Basophils Relative: 1.4 %
Eosinophils Absolute: 230 cells/uL (ref 15–500)
Eosinophils Relative: 4.6 %
HCT: 40.5 % (ref 35.0–45.0)
Hemoglobin: 12.6 g/dL (ref 11.7–15.5)
Lymphs Abs: 1790 cells/uL (ref 850–3900)
MCH: 24.9 pg — ABNORMAL LOW (ref 27.0–33.0)
MCHC: 31.1 g/dL — ABNORMAL LOW (ref 32.0–36.0)
MCV: 79.9 fL — ABNORMAL LOW (ref 80.0–100.0)
MPV: 10.9 fL (ref 7.5–12.5)
Monocytes Relative: 8 %
Neutro Abs: 2510 cells/uL (ref 1500–7800)
Neutrophils Relative %: 50.2 %
Platelets: 231 10*3/uL (ref 140–400)
RBC: 5.07 10*6/uL (ref 3.80–5.10)
RDW: 17.1 % — ABNORMAL HIGH (ref 11.0–15.0)
Total Lymphocyte: 35.8 %
WBC: 5 10*3/uL (ref 3.8–10.8)

## 2020-04-07 NOTE — Progress Notes (Signed)
Office Visit Note   Patient: [name]           Date of Birth: 09-05-1960           MRN: [id number] Visit Date: 04/07/2020              Requested by: Mateo Flow, MD [address],  [place] [address] PCP: Mateo Flow, MD   Assessment & Plan: Visit Diagnoses:  1. Trochanteric bursitis, right hip   2. Chronic pain of right knee   3. Unilateral primary osteoarthritis, right knee     Plan: Given her end-stage arthritis of the right knee and failed conservative treatment, I agree with proceeding with a right total knee arthroplasty in the near future.  Having had this done before on her left knee, she is fully aware of what the surgery involves.  She is fully aware of the risk and benefits of surgery as well.  All questions and concerns were answered and addressed.  We will see about scheduling her for surgery in the near future for a right total knee arthroplasty.  Follow-Up Instructions: Return for 2 weeks post-op.   Orders:  Orders Placed This Encounter  Procedures  . XR HIP UNILAT W OR W/O PELVIS 1V RIGHT   No orders of the defined types were placed in this encounter.     Procedures: No procedures performed   Clinical Data: No additional findings.   Subjective: Chief Complaint  Patient presents with  . Right Hip - Pain  . Right Knee - Pain  Ayo comes in today with significant right knee pain that is gotten worse for her.  We have replaced her left knee in 2018.  This is now causing her to have some right hip pain.  I last place a steroid injection in her right knee just 2 months ago.  She has been dealing with right knee pain for many years now.  A MRI a year ago showed areas of full-thickness cartilage loss in all 3 compartments of the knee.  At this point her right knee pain is definitely affecting her mobility, her quality of life and activities of daily living.  This is been going on for over 12 months now.  She is worked on activity  modification and weight loss.  We tried steroid injections as well.  She is worked on Publishing rights manager.  At this point her pain is 10 out of 10 and daily and she does wish to proceed with knee replacement surgery on the right knee.  She has had no acute change in her medical status.  She still works regularly and is very painful to her to work.  HPI  Review of Systems There is currently listed no headache, chest pain, shortness of breath, fever, chills, nausea, vomiting  Objective: Vital Signs: There were no vitals taken for this visit.  Physical Exam She is alert and orient x3 and in no acute distress Ortho Exam Examination of her right hip shows it moves smoothly and fluidly.  Her right knee has significant medial and lateral joint line tenderness as well as patellofemoral tenderness on exam.  The knee is loosely stable but does have some varus malalignment and is very painful. Specialty Comments:  No specialty comments available.  Imaging: XR HIP UNILAT W OR W/O PELVIS 1V RIGHT  Result Date: 04/07/2020 An AP pelvis and lateral of the right hip shows a normal-appearing hip and no acute findings.  PMFS History: Patient Active Problem List   Diagnosis Date Noted  . Unilateral primary osteoarthritis, right knee 04/07/2020  . Leg edema 08/19/2018  . Presence of left artificial knee joint 08/14/2016  . Unilateral primary osteoarthritis, left knee 07/31/2016  . Status post total knee replacement, left 07/31/2016  . Abdominal hematoma   . Post-operative state   . Paroxysmal atrial fibrillation (HCC)   . Controlled type 2 diabetes mellitus with diabetic nephropathy ([place])   . Wound infection 01/25/2016  . Hydronephrosis, left 01/25/2016  . Hydronephrosis, right 01/25/2016  . DVT (deep venous thrombosis) ([place]) 01/25/2016  . Atrial fibrillation ([place]) 01/25/2016  . Diabetes mellitus with complication ([place])   . Wound infection after surgery, initial encounter   . Varicose  veins of lower extremities with complications 42/70/6237  . Pain and swelling of left lower leg 10/27/2013  . Pain of left lower extremity 10/27/2013  . Gastroenteritis, acute 09/08/2013  . Fever, unspecified 09/04/2013  . ASD (atrial septal defect) 08/13/2013  . Pulmonary embolism ([place]) 06/22/2013  . Dyspnea 06/11/2013  . Anterior slip repaired Oct 2012 01/18/2011  . GI bleed 12/18/2010  . Diabetes mellitus ([place]) 12/18/2010  . Varicose veins of lower extremities with other complications 62/83/1517  . Diabetes mellitus   . HIP PAIN, RIGHT 03/06/2010   Past Medical History:  Diagnosis Date  . Anemia   . Arthritis   . Atrial fibrillation ([place])   . Bleeding ulcer 12-2010  hospitalized for 1 week  . Blood transfusion   . Blood transfusion without reported diagnosis    2011-2 units transfused, bleeding ulcer  . Bruises easily   . CHF (congestive heart failure) ([place])    with PE resolved after   . Constipation   . Difficulty in swallowing 03-24-13   no longer a problem  . Diverticulitis   . DVT (deep venous thrombosis) ([place]) 06-2013   right leg  . Dysrhythmia     Hx of A fib   . GERD (gastroesophageal reflux disease)   . Hyperlipidemia   . Leg swelling 03-24-13   not a problem any longer  . Peripheral vascular disease ([place])   . Pulmonary embolus ([place]) 06-2013  . Reflux   . Shortness of breath    with PE  . Vomiting 03-24-13   problem resolved after lap band revision    Family History  Problem Relation Age of Onset  . Heart disease Father   . Kidney disease Father   . Peripheral vascular disease Father   . Heart attack Father        74  . Diabetes Mother   . Arthritis Mother     Past Surgical History:  Procedure Laterality Date  . APPENDECTOMY    . CARPEL TUNNEL BILATEREAL    . CHOLECYSTECTOMY     5'12  . COLONOSCOPY N/A 04/10/2013   Procedure: COLONOSCOPY;  Surgeon: Beryle Beams, MD;  [facility]: [facility];  Service: Endoscopy;  Laterality: N/A;  . DENTAL  SURGERY  10/2019   tooth implant  . DIAGNOSTIC LAPAROSCOPY    . ENDOVENOUS ABLATION SAPHENOUS VEIN W/ LASER  06-2008  . ENDOVENOUS ABLATION SAPHENOUS VEIN W/ LASER Left 12-09-2013   EVLA LEFT GREATER SAPHENOUS VEIN BY TODD EARLY MD  . ESOPHAGOGASTRODUODENOSCOPY  12/18/2010   Procedure: ESOPHAGOGASTRODUODENOSCOPY (EGD);  Surgeon: Gatha Mayer, MD;  Location: [facility];  Service: Endoscopy;  Laterality: N/A;  . JOINT REPLACEMENT    . LAPAROSCOPIC GASTRIC BANDING  01-05-2008  . LAPAROSCOPIC GASTRIC BANDING  12/18/2010   01/04/2009  . SALPHINGOOOPHORECTOMY Right    cyst removal  . TONSILLECTOMY    . TOTAL KNEE ARTHROPLASTY Left 07/31/2016   Procedure: LEFT TOTAL KNEE ARTHROPLASTY;  Surgeon: [name], MD;  Location: [facility];  Service: Orthopedics;  Laterality: Left;  . WEDGE RESECTION OF OVARY  LEFT OVARY    Social History   Occupational History  . Occupation: Programmer, multimedia: [employer]  Tobacco Use  . Smoking status: Never Smoker  . Smokeless tobacco: Never Used  Vaping Use  . Vaping Use: Never used  Substance and Sexual Activity  . Alcohol use: No    Alcohol/week: 0.0 standard drinks  . Drug use: No  . Sexual activity: Yes    Partners: Male    Birth control/protection: None
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
## 2020-04-19 ENCOUNTER — Other Ambulatory Visit: Payer: Self-pay | Admitting: Cardiovascular Disease

## 2020-05-04 ENCOUNTER — Other Ambulatory Visit: Payer: Self-pay | Admitting: Cardiovascular Disease

## 2020-05-13 LAB — HM DIABETES EYE EXAM

## 2020-06-27 ENCOUNTER — Other Ambulatory Visit: Payer: Self-pay | Admitting: Rheumatology
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
## 2020-07-19 ENCOUNTER — Telehealth: Payer: Self-pay

## 2020-07-19 NOTE — Telephone Encounter (Signed)
Attempted to contact the patient and left message for patient to call the office.  

## 2020-07-19 NOTE — Telephone Encounter (Signed)
Please schedule an appointment with [name] on Thursday for evaluation.  It will be important to examine patient to see if she has synovitis.

## 2020-07-19 NOTE — Telephone Encounter (Signed)
Patient called stating over the past couple of days began experiencing pain in her wrist and hand, fatigue, and lower back pain.  Patient states the pain moves from joint to joint.  Patient states she has been taking Tylenol, but it hasn't helped.  Patient requested a return call to let her know if Dr. Estanislado Pandy could call in a prescription of Prednisone.

## 2020-07-20 ENCOUNTER — Other Ambulatory Visit: Payer: Self-pay | Admitting: Cardiovascular Disease

## 2020-07-20 NOTE — Progress Notes (Signed)
Office Visit Note  Patient: [name]             Date of Birth: 04/08/1960           MRN: [id number]             PCP: Mateo Flow, MD Referring: Mateo Flow, MD Visit Date: 07/21/2020 Occupation: @GUAROCC @  Subjective:  Pain in multiple joints   History of Present Illness: [name] is a 60 y.o. female with history of seropositive rheumatoid arthritis and osteoarthritis.  Patient is taking Plaquenil 200 mg 1 tablet by mouth twice daily Monday through Friday. She has not missed any doses of PLQ recently.  Plan to the patient about 2 weeks ago she started to experience increased fatigue, intermittent discomfort in both shoulders, and increased pain in both hands and both wrist joints.  Cording to the patient the stiffness and discomfort in her hands has been lasting all day.  She has tried applying heat as well as massage with minimal relief.  She cannot take NSAIDs due to being on Xarelto.  She has not been taking Tylenol due to the concern for elevated LFTs.  She is unable to take prednisone due to uncontrolled diabetes.  According to the patient she recently started to notice foul-smelling urine and incontinence.  She was evaluated by her PCP yesterday and was diagnosed with a UTI.  She was started on Macrobid yesterday.  She feels that her flares due to the recent infection.  She states that she has not been having more frequent flares up until this point.  She states that she still feels as though Plaquenil has been working overall.  She does not want to add any medications onto Plaquenil at this time.      Activities of Daily Living:  Patient reports morning stiffness for all day. Patient Reports nocturnal pain.  Difficulty dressing/grooming: Denies Difficulty climbing stairs: Reports Difficulty getting out of chair: Reports Difficulty using hands for taps, buttons, cutlery, and/or writing: Reports  Review of Systems  Constitutional:  Positive for fatigue.  HENT:   Positive for mouth dryness and nose dryness. Negative for mouth sores.   Eyes:  Positive for pain, itching and dryness.  Respiratory:  Positive for shortness of breath. Negative for difficulty breathing.   Cardiovascular:  Positive for palpitations. Negative for chest pain.  Gastrointestinal:  Negative for blood in stool, constipation and diarrhea.  Endocrine: Positive for increased urination.  Genitourinary:  Positive for difficulty urinating and painful urination.  Musculoskeletal:  Positive for joint pain, joint pain, joint swelling, muscle weakness and morning stiffness. Negative for myalgias, muscle tenderness and myalgias.  Skin:  Positive for redness. Negative for color change and rash.  Allergic/Immunologic: Positive for susceptible to infections.  Neurological:  Positive for numbness, headaches, parasthesias and memory loss. Negative for dizziness and weakness.  Hematological:  Positive for bruising/bleeding tendency.  Psychiatric/Behavioral:  Negative for confusion.    PMFS History:  Patient Active Problem List   Diagnosis Date Noted   Unilateral primary osteoarthritis, right knee 04/07/2020   Leg edema 08/19/2018   Presence of left artificial knee joint 08/14/2016   Unilateral primary osteoarthritis, left knee 07/31/2016   Status post total knee replacement, left 07/31/2016   Abdominal hematoma    Post-operative state    Paroxysmal atrial fibrillation ([place])    Controlled type 2 diabetes mellitus with diabetic nephropathy ([place])    Wound infection 01/25/2016   Hydronephrosis, left 01/25/2016   Hydronephrosis,  right 01/25/2016   DVT (deep venous thrombosis) ([place]) 01/25/2016   Atrial fibrillation ([place]) 01/25/2016   Diabetes mellitus with complication ([place])    Wound infection after surgery, initial encounter    Varicose veins of lower extremities with complications 33/29/5188   Pain and swelling of left lower leg 10/27/2013   Pain of left lower extremity 10/27/2013    Gastroenteritis, acute 09/08/2013   Fever, unspecified 09/04/2013   ASD (atrial septal defect) 08/13/2013   Pulmonary embolism ([place]) 06/22/2013   Dyspnea 06/11/2013   Anterior slip repaired Oct 2012 01/18/2011   GI bleed 12/18/2010   Diabetes mellitus ([place]) 12/18/2010   Varicose veins of lower extremities with other complications 41/66/0630   Diabetes mellitus    HIP PAIN, RIGHT 03/06/2010    Past Medical History:  Diagnosis Date   Anemia    Arthritis    Atrial fibrillation ([place])    Bleeding ulcer 12-2010  hospitalized for 1 week   Blood transfusion    Blood transfusion without reported diagnosis    2011-2 units transfused, bleeding ulcer   Bruises easily    CHF (congestive heart failure) ([place])    with PE resolved after    Constipation    Difficulty in swallowing 03-24-13   no longer a problem   Diverticulitis    DVT (deep venous thrombosis) ([place]) 06-2013   right leg   Dysrhythmia     Hx of A fib    GERD (gastroesophageal reflux disease)    Hyperlipidemia    Leg swelling 03-24-13   not a problem any longer   Peripheral vascular disease (HCC)    Pulmonary embolus ([place]) 06-2013   Reflux    Shortness of breath    with PE   Vomiting 03-24-13   problem resolved after lap band revision    Family History  Problem Relation Age of Onset   Heart disease Father    Kidney disease Father    Peripheral vascular disease Father    Heart attack Father        62   Diabetes Mother    Arthritis Mother    Past Surgical History:  Procedure Laterality Date   APPENDECTOMY     CARPEL TUNNEL [place]     1'60   COLONOSCOPY N/A 04/10/2013   Procedure: COLONOSCOPY;  Surgeon: Beryle Beams, MD;  Location: [facility];  Service: Endoscopy;  Laterality: N/A;   DENTAL SURGERY  10/2019   tooth implant   DIAGNOSTIC LAPAROSCOPY     ENDOVENOUS ABLATION SAPHENOUS VEIN W/ LASER  06-2008   ENDOVENOUS ABLATION SAPHENOUS VEIN W/ LASER Left 12-09-2013   EVLA LEFT GREATER  SAPHENOUS VEIN BY TODD EARLY MD   ESOPHAGOGASTRODUODENOSCOPY  12/18/2010   Procedure: ESOPHAGOGASTRODUODENOSCOPY (EGD);  Surgeon: Gatha Mayer, MD;  Location: [facility];  Service: Endoscopy;  Laterality: N/A;   JOINT REPLACEMENT     LAPAROSCOPIC GASTRIC BANDING  01-05-2008   LAPAROSCOPIC GASTRIC BANDING  12/18/2010   01/04/2009   SALPHINGOOOPHORECTOMY Right    cyst removal   TONSILLECTOMY     TOTAL KNEE ARTHROPLASTY Left 07/31/2016   Procedure: LEFT TOTAL KNEE ARTHROPLASTY;  Surgeon: [name], MD;  Location: [facility];  Service: Orthopedics;  Laterality: Left;   WEDGE RESECTION OF OVARY  LEFT OVARY    Social History   Social History Narrative   Not on file   Immunization History  Administered Date(s) Administered   Influenza Split 10/06/2012   Influenza,inj,Quad PF,6+ Mos 01/30/2016  Moderna Sars-Covid-2 Vaccination 09/07/2019, 10/09/2019   Pneumococcal Conjugate-13 11/29/2015     Objective: Vital Signs: BP 131/85 (BP Location: Left Wrist, Patient Position: Sitting, Cuff Size: Normal)   Pulse 74   Resp 16   Ht 5\' 6"  (1.676 m)   Wt (!) 333 lb (151 kg)   BMI 53.75 kg/m    Physical Exam Vitals and nursing note reviewed.  Constitutional:      Appearance: She is well-developed.  HENT:     Head: Normocephalic and atraumatic.  Eyes:     Conjunctiva/sclera: Conjunctivae normal.  Pulmonary:     Effort: Pulmonary effort is normal.  Abdominal:     Palpations: Abdomen is soft.  Musculoskeletal:     Cervical back: Normal range of motion.  Skin:    General: Skin is warm and dry.     Capillary Refill: Capillary refill takes less than 2 seconds.  Neurological:     Mental Status: She is alert and oriented to person, place, and time.  Psychiatric:        Behavior: Behavior normal.     Musculoskeletal Exam: C-spine good ROM.  Shoulder joints have good ROM with some discomfort bilaterally.  Elbow joints good ROM with no tenderness or inflammation.  Tenderness  and mild inflammation on the dorsal aspect of both wrist joints.  Tenderness over the right 1st MCP, 4th MCP, and 5th MCP joint.  Tenderness over the left 2nd and 5th MCP joint.  Tenderness and synovitis of the left 5th PIP joint.  Left knee replacement has good ROM.  Right knee has painful ROM with warmth.  Ankle joints good ROM.  Pedal edema bilaterally.    CDAI Exam: CDAI Score: 1.4  Patient Global: 7 mm; Provider Global: 7 mm Swollen: 0 ; Tender: 0  Joint Exam 07/21/2020   No joint exam has been documented for this visit   There is currently no information documented on the homunculus. Go to the Rheumatology activity and complete the homunculus joint exam.  Investigation: No additional findings.  Imaging: No results found.  Recent Labs: Lab Results  Component Value Date   WBC 5.0 04/07/2020   HGB 12.6 04/07/2020   PLT 231 04/07/2020   NA 139 04/07/2020   K 4.4 04/07/2020   CL 106 04/07/2020   CO2 26 04/07/2020   GLUCOSE 117 (H) 04/07/2020   BUN 10 04/07/2020   CREATININE 0.66 04/07/2020   BILITOT 0.4 04/07/2020   ALKPHOS 58 12/14/2018   AST 30 04/07/2020   ALT 21 04/07/2020   PROT 6.5 04/07/2020   ALBUMIN 3.4 (L) 12/14/2018   CALCIUM 8.7 04/07/2020   GFRAA 111 04/07/2020    Speciality Comments: PLQ eye exam: 05/13/2020 Normal. [facility]. Follow up in 08/12/2020  Procedures:  No procedures performed Allergies: Contrast media [iodinated diagnostic agents], Penicillins, and Ancef [cefazolin sodium]   Assessment / Plan:     Visit Diagnoses: Rheumatoid arthritis with rheumatoid factor of multiple sites without organ or systems involvement ([place]) - Positive rheumatoid factor, negative ANA, negative CCP: She presents today with increased pain and stiffness in multiple joints, which started about 2 weeks ago.  She has tenderness and mild inflammation on the dorsal aspect of both wrist joints.  Painful ROM and warmth of the right knee.  Tenderness of several MCPs as  described above.  She has been taking plaquenil 200 mg 1 tablet by mouth twice daily Monday through Friday.  She has not missed any doses of PLQ recently. She was  evaluated by her PCP yesterday due to noticing foul smelling urine and incontinence.  She was diagnosed with a UTI and started on macrobid. She attributes her flare to the recent infection. She has been experiencing increased fatigue over the past 2 weeks as well.  She overall feels that PLQ has been effective at managing her RA.  She has not had any other flares since her last visit.  She is unable to take NSAIDs due to being on Xarelto.  She cannot take prednisone due to uncontrolled diabetes.  She has been apprehensive to take Tylenol due to history of elevated LFTs.  Her LFTs have been within normal limits since July 2021.  She can take Tylenol sparingly for pain relief.  She will return for lab work in 1 month to monitor for drug toxicity.  She is apprehensive to add any other medications onto Plaquenil for combination therapy.  We discussed if she continues to have recurrent flares we will need to discuss combination therapy at her follow-up visit.  We also discussed the importance of updating x-rays of both hands and both feet at her next follow up visit.  She was advised to increase the dose of Plaquenil to 200 mg 1 tablet by mouth twice daily.  She was advised to notify us when she will need a refill.  She will follow up in 3 months.  High risk medication use - Plaquenil 200 mg 1 tablet by mouth twice daily.  CBC and CMP were drawn on 04/07/2020.  She will return for lab work in July.  Standing orders for CBC and CMP were placed today. PLQ eye exam: 05/13/2020 Normal. [facility]. - Plan: CBC with Differential/Platelet, COMPLETE METABOLIC PANEL WITH GFR  S/P total knee replacement, left - Performed by Dr. Ninfa Linden in June 2018.  She has been experiencing increased discomfort in the left knee replacement.   Primary osteoarthritis of right knee:  Chronic pain.  She has been experiencing increased pain and stiffness in the right knee joint. Painful ROM and warmth of the right knee was noted on exam.   Other medical conditions are listed as follows:   ASD (atrial septal defect)  Paroxysmal atrial fibrillation (HCC)  Hydronephrosis, left  Hydronephrosis, right  History of diverticulitis  History of DVT (deep vein thrombosis)  Controlled type 2 diabetes mellitus with diabetic nephropathy, unspecified whether long term insulin use (HCC)  Varicose veins of left lower extremity with complications  Septic pulmonary embolism with acute cor pulmonale, unspecified chronicity ([place])  Orders: Orders Placed This Encounter  Procedures   CBC with Differential/Platelet   COMPLETE METABOLIC PANEL WITH GFR    No orders of the defined types were placed in this encounter.    Follow-Up Instructions: Return in about 3 months (around 10/21/2020) for Rheumatoid arthritis, Osteoarthritis.   Ofilia Neas, PA-C  Note - This record has been created using Dragon software.  Chart creation errors have been sought, but may not always  have been located. Such creation errors do not reflect on  the standard of medical care.

## 2020-07-20 NOTE — Telephone Encounter (Signed)
Spoke with patient and scheduled for 07/21/2020 at 8:20 am for evaluation.

## 2020-07-21 ENCOUNTER — Other Ambulatory Visit: Payer: Self-pay

## 2020-07-21 ENCOUNTER — Ambulatory Visit (INDEPENDENT_AMBULATORY_CARE_PROVIDER_SITE_OTHER): Payer: No Typology Code available for payment source | Admitting: Physician Assistant

## 2020-07-21 ENCOUNTER — Encounter: Payer: Self-pay | Admitting: Physician Assistant

## 2020-07-21 VITALS — BP 131/85 | HR 74 | Resp 16 | Ht 66.0 in | Wt 333.0 lb

## 2020-07-21 DIAGNOSIS — E1121 Type 2 diabetes mellitus with diabetic nephropathy: Secondary | ICD-10-CM

## 2020-07-21 DIAGNOSIS — Q211 Atrial septal defect, unspecified: Secondary | ICD-10-CM

## 2020-07-21 DIAGNOSIS — Z96652 Presence of left artificial knee joint: Secondary | ICD-10-CM

## 2020-07-21 DIAGNOSIS — Z79899 Other long term (current) drug therapy: Secondary | ICD-10-CM | POA: Diagnosis not present

## 2020-07-21 DIAGNOSIS — M1711 Unilateral primary osteoarthritis, right knee: Secondary | ICD-10-CM | POA: Diagnosis not present

## 2020-07-21 DIAGNOSIS — Z8719 Personal history of other diseases of the digestive system: Secondary | ICD-10-CM

## 2020-07-21 DIAGNOSIS — I83892 Varicose veins of left lower extremities with other complications: Secondary | ICD-10-CM

## 2020-07-21 DIAGNOSIS — I480 Paroxysmal atrial fibrillation: Secondary | ICD-10-CM

## 2020-07-21 DIAGNOSIS — N1330 Unspecified hydronephrosis: Secondary | ICD-10-CM

## 2020-07-21 DIAGNOSIS — I2601 Septic pulmonary embolism with acute cor pulmonale: Secondary | ICD-10-CM

## 2020-07-21 DIAGNOSIS — Z86718 Personal history of other venous thrombosis and embolism: Secondary | ICD-10-CM

## 2020-07-21 DIAGNOSIS — M0579 Rheumatoid arthritis with rheumatoid factor of multiple sites without organ or systems involvement: Secondary | ICD-10-CM | POA: Diagnosis not present

## 2020-07-21 MED ORDER — HYDROXYCHLOROQUINE SULFATE 200 MG PO TABS: 200.0000 mg | ORAL_TABLET | Freq: Two times a day (BID) | ORAL | 0 refills | Status: DC

## 2020-07-21 NOTE — Progress Notes (Unsigned)
Please review and sign no print plaquenil prescription that reflects dose change you advised at patient's appointment. Thanks!

## 2020-08-01 ENCOUNTER — Ambulatory Visit (INDEPENDENT_AMBULATORY_CARE_PROVIDER_SITE_OTHER): Payer: No Typology Code available for payment source | Admitting: Orthopaedic Surgery

## 2020-08-01 ENCOUNTER — Encounter: Payer: Self-pay | Admitting: Orthopaedic Surgery

## 2020-08-01 DIAGNOSIS — M1711 Unilateral primary osteoarthritis, right knee: Secondary | ICD-10-CM

## 2020-08-01 DIAGNOSIS — G8929 Other chronic pain: Secondary | ICD-10-CM

## 2020-08-01 DIAGNOSIS — M25561 Pain in right knee: Secondary | ICD-10-CM | POA: Diagnosis not present

## 2020-08-01 MED ORDER — LIDOCAINE HCL 1 % IJ SOLN
3.0000 mL | INTRAMUSCULAR | Status: AC | PRN
  Administered 2020-08-01: 3 mL

## 2020-08-01 MED ORDER — METHYLPREDNISOLONE ACETATE 40 MG/ML IJ SUSP
40.0000 mg | INTRAMUSCULAR | Status: AC | PRN
  Administered 2020-08-01: 40 mg via INTRA_ARTICULAR

## 2020-08-01 NOTE — Progress Notes (Signed)
Office Visit Note   Patient: [name]           Date of Birth: 1960/05/17           MRN: [id number] Visit Date: 08/01/2020              Requested by: Mateo Flow, MD [place],  [place] [id number] PCP: Mateo Flow, MD   Assessment & Plan: Visit Diagnoses:  1. Chronic pain of right knee   2. Unilateral primary osteoarthritis, right knee     Plan: Per the patient's request I did provide a steroid injection in her right knee today which she tolerated well.  All questions and concerns were answered addressed.  We can see her back in early September to consider scheduling her for an October knee replacement for the right knee.  Follow-Up Instructions: Return in about 3 months (around 11/01/2020).   Orders:  Orders Placed This Encounter  Procedures   Large Joint Inj   No orders of the defined types were placed in this encounter.     Procedures: Large Joint Inj: R knee on 08/01/2020 8:11 AM Indications: diagnostic evaluation and pain Details: 22 G 1.5 in needle, superolateral approach  Arthrogram: No  Medications: 3 mL lidocaine 1 %; 40 mg methylPREDNISolone acetate 40 MG/ML Outcome: tolerated well, no immediate complications Procedure, treatment alternatives, risks and benefits explained, specific risks discussed. Consent was given by the patient. Immediately prior to procedure a time out was called to verify the correct patient, procedure, equipment, support staff and site/side marked as required. Patient was prepped and draped in the usual sterile fashion.      Clinical Data: No additional findings.   Subjective: Chief Complaint  Patient presents with   Right Knee - Pain  The patient is well-known to me.  She has severe end-stage arthritis of her right knee.  The last time we injected her right knee was about 5 months ago to almost 6 months ago.  She has had a flareup of her rheumatoid arthritis as well and is requesting steroid injection in her  right knee.  We have replaced her left knee.  It is hurting some but it may be due to compensation due to her right knee pain.  She is morbidly obese and has done well with the left knee replacement.  She is interested in potentially proceeding with a right total knee replacement in October of this year.  She has had no other acute change in her medical status.  HPI  Review of Systems Today she denies any headache, chest pain, shortness of breath, fever, chills, nausea, vomiting  Objective: Vital Signs: There were no vitals taken for this visit.  Physical Exam She is alert and orient x3 in no acute distress Ortho Exam Examination of her right knee shows varus malalignment.  There is a mild effusion with significant pain throughout the arc of motion of the right knee.  It feels ligamentously stable.  Most the pain is along the medial joint line. Specialty Comments:  No specialty comments available.  Imaging: No results found.   PMFS History: Patient Active Problem List   Diagnosis Date Noted   Unilateral primary osteoarthritis, right knee 04/07/2020   Leg edema 08/19/2018   Presence of left artificial knee joint 08/14/2016   Unilateral primary osteoarthritis, left knee 07/31/2016   Status post total knee replacement, left 07/31/2016   Abdominal hematoma    Post-operative state    Paroxysmal  atrial fibrillation ([place])    Controlled type 2 diabetes mellitus with diabetic nephropathy ([place])    Wound infection 01/25/2016   Hydronephrosis, left 01/25/2016   Hydronephrosis, right 01/25/2016   DVT (deep venous thrombosis) ([place]) 01/25/2016   Atrial fibrillation ([place]) 01/25/2016   Diabetes mellitus with complication ([place])    Wound infection after surgery, initial encounter    Varicose veins of lower extremities with complications 58/10/9831   Pain and swelling of left lower leg 10/27/2013   Pain of left lower extremity 10/27/2013   Gastroenteritis, acute 09/08/2013   Fever,  unspecified 09/04/2013   ASD (atrial septal defect) 08/13/2013   Pulmonary embolism ([place]) 06/22/2013   Dyspnea 06/11/2013   Anterior slip repaired Oct 2012 01/18/2011   GI bleed 12/18/2010   Diabetes mellitus ([place]) 12/18/2010   Varicose veins of lower extremities with other complications 82/50/5397   Diabetes mellitus    HIP PAIN, RIGHT 03/06/2010   Past Medical History:  Diagnosis Date   Anemia    Arthritis    Atrial fibrillation ([place])    Bleeding ulcer 12-2010  hospitalized for 1 week   Blood transfusion    Blood transfusion without reported diagnosis    2011-2 units transfused, bleeding ulcer   Bruises easily    CHF (congestive heart failure) ([place])    with PE resolved after    Constipation    Difficulty in swallowing 03-24-13   no longer a problem   Diverticulitis    DVT (deep venous thrombosis) ([place]) 06-2013   right leg   Dysrhythmia     Hx of A fib    GERD (gastroesophageal reflux disease)    Hyperlipidemia    Leg swelling 03-24-13   not a problem any longer   Peripheral vascular disease (HCC)    Pulmonary embolus ([place]) 06-2013   Reflux    Shortness of breath    with PE   Vomiting 03-24-13   problem resolved after lap band revision    Family History  Problem Relation Age of Onset   Heart disease Father    Kidney disease Father    Peripheral vascular disease Father    Heart attack Father        19   Diabetes Mother    Arthritis Mother     Past Surgical History:  Procedure Laterality Date   APPENDECTOMY     [place]     CHOLECYSTECTOMY     6'73   COLONOSCOPY N/A 04/10/2013   Procedure: COLONOSCOPY;  Surgeon: Beryle Beams, MD;  [facility];  Service: Endoscopy;  Laterality: N/A;   DENTAL SURGERY  10/2019   tooth implant   DIAGNOSTIC LAPAROSCOPY     ENDOVENOUS ABLATION SAPHENOUS VEIN W/ LASER  06-2008   ENDOVENOUS ABLATION SAPHENOUS VEIN W/ LASER Left 12-09-2013   EVLA LEFT GREATER SAPHENOUS VEIN BY TODD EARLY MD    ESOPHAGOGASTRODUODENOSCOPY  12/18/2010   Procedure: ESOPHAGOGASTRODUODENOSCOPY (EGD);  Surgeon: Gatha Mayer, MD;  Location: [facility];  Service: Endoscopy;  Laterality: N/A;   JOINT REPLACEMENT     LAPAROSCOPIC GASTRIC BANDING  01-05-2008   LAPAROSCOPIC GASTRIC BANDING  12/18/2010   01/04/2009   SALPHINGOOOPHORECTOMY Right    cyst removal   TONSILLECTOMY     TOTAL KNEE ARTHROPLASTY Left 07/31/2016   Procedure: LEFT TOTAL KNEE ARTHROPLASTY;  Surgeon: [name], MD;  Location: [facility];  Service: Orthopedics;  Laterality: Left;   WEDGE RESECTION OF OVARY  LEFT OVARY    Social History  Occupational History   Occupation: Programmer, multimedia: [employer]  Tobacco Use   Smoking status: Never   Smokeless tobacco: Never  Vaping Use   Vaping Use: Never used  Substance and Sexual Activity   Alcohol use: No    Alcohol/week: 0.0 standard drinks   Drug use: No   Sexual activity: Yes    Partners: Male    Birth control/protection: None

## 2020-08-03 ENCOUNTER — Other Ambulatory Visit: Payer: Self-pay | Admitting: Cardiovascular Disease

## 2020-08-07 ENCOUNTER — Emergency Department (HOSPITAL_BASED_OUTPATIENT_CLINIC_OR_DEPARTMENT_OTHER): Payer: No Typology Code available for payment source

## 2020-08-07 ENCOUNTER — Other Ambulatory Visit: Payer: Self-pay

## 2020-08-07 ENCOUNTER — Encounter (HOSPITAL_COMMUNITY): Payer: Self-pay

## 2020-08-07 ENCOUNTER — Emergency Department (HOSPITAL_COMMUNITY)
Admission: EM | Admit: 2020-08-07 | Discharge: 2020-08-07 | Disposition: A | Discharge status: Home / Self Care | Payer: No Typology Code available for payment source | Attending: Emergency Medicine | Admitting: Emergency Medicine

## 2020-08-07 DIAGNOSIS — Z96652 Presence of left artificial knee joint: Secondary | ICD-10-CM | POA: Diagnosis not present

## 2020-08-07 DIAGNOSIS — Z7901 Long term (current) use of anticoagulants: Secondary | ICD-10-CM | POA: Insufficient documentation

## 2020-08-07 DIAGNOSIS — E119 Type 2 diabetes mellitus without complications: Secondary | ICD-10-CM | POA: Diagnosis not present

## 2020-08-07 DIAGNOSIS — M79604 Pain in right leg: Secondary | ICD-10-CM | POA: Diagnosis present

## 2020-08-07 DIAGNOSIS — I509 Heart failure, unspecified: Secondary | ICD-10-CM | POA: Diagnosis not present

## 2020-08-07 DIAGNOSIS — I480 Paroxysmal atrial fibrillation: Secondary | ICD-10-CM | POA: Diagnosis not present

## 2020-08-07 DIAGNOSIS — Z79899 Other long term (current) drug therapy: Secondary | ICD-10-CM | POA: Insufficient documentation

## 2020-08-07 DIAGNOSIS — Z86718 Personal history of other venous thrombosis and embolism: Secondary | ICD-10-CM

## 2020-08-07 NOTE — ED Notes (Signed)
D/c instructions reviewed and explained, pt verbalized understanding.

## 2020-08-07 NOTE — ED Provider Notes (Signed)
Emergency Medicine Provider Triage Evaluation Note  [name] , a 60 y.o. female  was evaluated in triage.  Pt complains of tenderness to right lower extremity.  Tenderness began last night.  Tenderness is constant.  Patient rates pain 1/10 on the pain scale.  No change in pain with ambulation or movement.  Patient denies any recent falls or injuries.  Patient has history of right lower leg DVT.  Patient is currently on Xarelto and reports she is compliant with this medication.  She denies any swelling or erythema to right lower extremity.  Patient denies any chest pain, shortness of breath, hemoptysis.  Review of Systems  Positive: Right lower leg tenderness Negative: Chest pain, shortness of breath, hemoptysis  Physical Exam  BP (!) 141/77   Pulse (!) 58   Temp 98.4 F (36.9 C)   Resp 16   SpO2 98%  Gen:   Awake, no distress   Resp:  Normal effort, lungs clear to auscultation bilaterally.  Patient able speak in full complete sentences without difficulty. MSK:   Moves extremities without difficult.  Tenderness to lateral aspect of right calf.  No swelling or erythema noted to patient's right lower extremity.  +2 right dorsalis pedis pulse.  Cap refill less than 3 seconds in all digits of right foot.  Sensation intact to all digits of right foot.  No tenderness to medial or lateral malleolus..  Strength to dorsiflexion and plantar flexion. Other:    Medical Decision Making  Medically screening exam initiated at 12:01 PM.  Appropriate orders placed.  Timaya Bojarski Nobile was informed that the remainder of the evaluation will be completed by another provider, this initial triage assessment does not replace that evaluation, and the importance of remaining in the ED until their evaluation is complete.  The patient appears stable so that the remainder of the work up may be completed by another provider.      Dyann Ruddle 08/07/20 1204    Arnaldo Natal, MD 08/07/20  1302

## 2020-08-07 NOTE — Progress Notes (Signed)
VASCULAR LAB    Right lower extremity venous duplex has been performed.  See CV proc for preliminary results.  Called report to [facility], Charge RN   Mauro Kaufmann, Jazaria Jarecki, RVT 08/07/2020, 12:42 PM

## 2020-08-07 NOTE — ED Provider Notes (Signed)
[facility] EMERGENCY DEPARTMENT Provider Note   CSN: [id number] Arrival date & time: 08/07/20  1037     History No chief complaint on file.   [name] is a 60 y.o. female.  The history is provided by the patient and medical records. No language interpreter was used.   60 year old female significant history of DVT currently on Xarelto who presents for evaluation of leg pain.  Patient reports she noticed some tenderness to her right lower extremity that began last night.  Pain is described as an achy throbbing sensation persistent but has significantly improved and currently rates pain as 1 out of 10.  Denies any significant pain with ambulation or with palpation.  Denies any recent injury.  She is currently on Xarelto for DVT of her right lower extremity and states she has been compliant with her medication.  She does not complain of any fever chills chest pain shortness of breath.  She does admits to prolonged sitting.  Past Medical History:  Diagnosis Date   Anemia    Arthritis    Atrial fibrillation ([place])    Bleeding ulcer 12-2010  hospitalized for 1 week   Blood transfusion    Blood transfusion without reported diagnosis    2011-2 units transfused, bleeding ulcer   Bruises easily    CHF (congestive heart failure) ([place])    with PE resolved after    Constipation    Difficulty in swallowing 03-24-13   no longer a problem   Diverticulitis    DVT (deep venous thrombosis) ([place]) 06-2013   right leg   Dysrhythmia     Hx of A fib    GERD (gastroesophageal reflux disease)    Hyperlipidemia    Leg swelling 03-24-13   not a problem any longer   Peripheral vascular disease ([place])    Pulmonary embolus ([place]) [phone]   Reflux    Shortness of breath    with PE   Vomiting 03-24-13   problem resolved after lap band revision    Patient Active Problem List   Diagnosis Date Noted   Unilateral primary osteoarthritis, right knee 04/07/2020   Leg edema  08/19/2018   Presence of left artificial knee joint 08/14/2016   Unilateral primary osteoarthritis, left knee 07/31/2016   Status post total knee replacement, left 07/31/2016   Abdominal hematoma    Post-operative state    Paroxysmal atrial fibrillation ([place])    Controlled type 2 diabetes mellitus with diabetic nephropathy ([place])    Wound infection 01/25/2016   Hydronephrosis, left 01/25/2016   Hydronephrosis, right 01/25/2016   DVT (deep venous thrombosis) ([place]) 01/25/2016   Atrial fibrillation ([place]) 01/25/2016   Diabetes mellitus with complication ([place])    Wound infection after surgery, initial encounter    Varicose veins of lower extremities with complications 62/13/0865   Pain and swelling of left lower leg 10/27/2013   Pain of left lower extremity 10/27/2013   Gastroenteritis, acute 09/08/2013   Fever, unspecified 09/04/2013   ASD (atrial septal defect) 08/13/2013   Pulmonary embolism ([place]) 06/22/2013   Dyspnea 06/11/2013   Anterior slip repaired Oct 2012 01/18/2011   GI bleed 12/18/2010   Diabetes mellitus ([place]) 12/18/2010   Varicose veins of lower extremities with other complications 78/46/9629   Diabetes mellitus    HIP PAIN, RIGHT 03/06/2010    Past Surgical History:  Procedure Laterality Date   APPENDECTOMY     CARPEL TUNNEL BILATEREAL     CHOLECYSTECTOMY     5'12  COLONOSCOPY N/A 04/10/2013   Procedure: COLONOSCOPY;  Surgeon: Beryle Beams, MD;  Location: [facility];  Service: Endoscopy;  Laterality: N/A;   DENTAL SURGERY  10/2019   tooth implant   DIAGNOSTIC LAPAROSCOPY     ENDOVENOUS ABLATION SAPHENOUS VEIN W/ LASER  06-2008   ENDOVENOUS ABLATION SAPHENOUS VEIN W/ LASER Left 12-09-2013   EVLA LEFT GREATER SAPHENOUS VEIN BY TODD EARLY MD   ESOPHAGOGASTRODUODENOSCOPY  12/18/2010   Procedure: ESOPHAGOGASTRODUODENOSCOPY (EGD);  Surgeon: Gatha Mayer, MD;  Location: [facility];  Service: Endoscopy;  Laterality: N/A;   JOINT REPLACEMENT     LAPAROSCOPIC  GASTRIC BANDING  01-05-2008   LAPAROSCOPIC GASTRIC BANDING  12/18/2010   01/04/2009   SALPHINGOOOPHORECTOMY Right    cyst removal   TONSILLECTOMY     TOTAL KNEE ARTHROPLASTY Left 07/31/2016   Procedure: LEFT TOTAL KNEE ARTHROPLASTY;  Surgeon: [name], MD;  Location: [facility];  Service: Orthopedics;  Laterality: Left;   WEDGE RESECTION OF OVARY  LEFT OVARY      OB History   No obstetric history on file.     Family History  Problem Relation Age of Onset   Heart disease Father    Kidney disease Father    Peripheral vascular disease Father    Heart attack Father        62   Diabetes Mother    Arthritis Mother     Social History   Tobacco Use   Smoking status: Never   Smokeless tobacco: Never  Vaping Use   Vaping Use: Never used  Substance Use Topics   Alcohol use: No    Alcohol/week: 0.0 standard drinks   Drug use: No    Home Medications Prior to Admission medications   Medication Sig Start Date End Date Taking? Authorizing Provider  Artificial Tear Ointment (DRY EYES OP) Apply 1 drop to eye daily as needed (for dry eyes).    [provider]  Cholecalciferol (VITAMIN D-3) 125 MCG (5000 UT) TABS Take 1 tablet by mouth daily.    [provider]  hydroxychloroquine (PLAQUENIL) 200 MG tablet Take 1 tablet (200 mg total) by mouth 2 (two) times daily. 07/21/20   Ofilia Neas, PA-C  metoprolol succinate (TOPROL-XL) 25 MG 24 hr tablet Take 1 tablet (25 mg total) by mouth daily. Please make overdue appt with Dr. Acie Fredrickson before anymore refills. Thank you 2nd attempt 07/20/20   Nahser, Wonda Cheng, MD  Multiple Vitamin (MULTIVITAMIN) tablet Take 1 tablet by mouth daily.    [provider]  nitrofurantoin, macrocrystal-monohydrate, (MACROBID) 100 MG capsule 2 (two) times daily. 07/20/20   [provider]  omeprazole (PRILOSEC) 20 MG capsule omeprazole 20 mg capsule,delayed release    [provider]  rivaroxaban (XARELTO) 20 MG  TABS tablet Take 20 mg by mouth daily.     [provider]    Allergies    Contrast media [iodinated diagnostic agents], Penicillins, and Ancef [cefazolin sodium]  Review of Systems   Review of Systems  All other systems reviewed and are negative.  Physical Exam Updated Vital Signs BP 127/73 (BP Location: Right Arm)   Pulse (!) 56   Temp 98.4 F (36.9 C) (Oral)   Resp 16   SpO2 100%   Physical Exam Vitals and nursing note reviewed.  Constitutional:      General: She is not in acute distress.    Appearance: She is well-developed. She is obese.  HENT:     Head: Atraumatic.  Eyes:     Conjunctiva/sclera: Conjunctivae normal.  Cardiovascular:     Rate and Rhythm: Normal rate and regular rhythm.  Pulmonary:     Effort: Pulmonary effort is normal.  Abdominal:     Palpations: Abdomen is soft.  Musculoskeletal:        General: No swelling.     Cervical back: Neck supple.     Right lower leg: No edema.     Left lower leg: No edema.  Skin:    General: Skin is warm.     Findings: No rash.  Neurological:     Mental Status: She is alert.  Psychiatric:        Mood and Affect: Mood normal.    ED Results / Procedures / Treatments   Labs (all labs ordered are listed, but only abnormal results are displayed) Labs Reviewed - No data to display  EKG None  Radiology VAS [place] LOWER EXTREMITY VENOUS (DVT) (ONLY [facility] & [facility])  Result Date: 08/07/2020  Lower Venous DVT Study Patient Name:  [name]  Date of Exam:   08/07/2020 Medical Rec #: [id number]          Accession #:    [id number] Date of Birth: [birth year]           Patient Gender: F Patient Age:   60Y Exam Location:  [facility] Procedure:      VAS [place] LOWER EXTREMITY VENOUS (DVT) Referring Phys: [id number] Rudell Cobb BADALAMENTE --------------------------------------------------------------------------------  Indications: Pain, and Personal history of DVT.  Risk Factors: DVT History of DVT in 2015. Comparison        Prior negative right lower extremity venous duplex done Study:           07/17/2018 Performing Technologist: Sharion Dove RVS  Examination Guidelines: A complete evaluation includes B-mode imaging, spectral Doppler, color Doppler, and power Doppler as needed of all accessible portions of each vessel. Bilateral testing is considered an integral part of a complete examination. Limited examinations for reoccurring indications may be performed as noted. The reflux portion of the exam is performed with the patient in reverse Trendelenburg.  +---------+---------------+---------+-----------+----------+--------------+ RIGHT    CompressibilityPhasicitySpontaneityPropertiesThrombus Aging +---------+---------------+---------+-----------+----------+--------------+ CFV      Full           Yes      Yes                                 +---------+---------------+---------+-----------+----------+--------------+ SFJ      Full                                                        +---------+---------------+---------+-----------+----------+--------------+ FV Prox  Full                                                        +---------+---------------+---------+-----------+----------+--------------+ FV Mid   Full                                                        +---------+---------------+---------+-----------+----------+--------------+  FV DistalFull                                                        +---------+---------------+---------+-----------+----------+--------------+ PFV      Full                                                        +---------+---------------+---------+-----------+----------+--------------+ POP      Full           Yes      Yes                                 +---------+---------------+---------+-----------+----------+--------------+ PTV      Full                                                         +---------+---------------+---------+-----------+----------+--------------+ PERO     Full                                                        +---------+---------------+---------+-----------+----------+--------------+ Gastroc  Full                                                        +---------+---------------+---------+-----------+----------+--------------+   +----+---------------+---------+-----------+----------+--------------+ LEFTCompressibilityPhasicitySpontaneityPropertiesThrombus Aging +----+---------------+---------+-----------+----------+--------------+ CFV Full           Yes      Yes                                 +----+---------------+---------+-----------+----------+--------------+    Summary: RIGHT: - No evidence of common femoral vein obstruction.  LEFT: - No evidence of common femoral vein obstruction.  *See table(s) above for measurements and observations.    Preliminary     Procedures Procedures   Medications Ordered in ED Medications - No data to display  ED Course  I have reviewed the triage vital signs and the nursing notes.  Pertinent labs & imaging results that were available during my care of the patient were reviewed by me and considered in my medical decision making (see chart for details).    MDM Rules/Calculators/A&P                          BP 127/73 (BP Location: Right Arm)   Pulse (!) 56   Temp 98.4 F (36.9 C) (Oral)   Resp 16   SpO2 100%   Final Clinical Impression(s) / ED Diagnoses Final diagnoses:  Acute leg pain, right    Rx / DC Orders ED Discharge  Orders     None      2:51 PM Patient report noticing pain to her right lower extremity since yesterday but pain mostly resolved.  Does have history of DVT in 2015 and has been compliant with her Xarelto.  On exam she does not have any significant edema about the lower extremity no evidence of cellulitis.  No recent injury.  Venous Doppler study today without any  evidence of DVT.  Patient otherwise stable for discharge.  Reassurance given.  Outpatient follow-up recommended.  Return precaution given.BP 127/73 (BP Location: Right Arm)   Pulse (!) 56   Temp 98.4 F (36.9 C) (Oral)   Resp 16   SpO2 100%     Domenic Moras, PA-C 08/07/20 1505    Truddie Hidden, MD 08/08/20 1013

## 2020-08-07 NOTE — Discharge Instructions (Signed)
You have been evaluated for your right leg pain.  Fortunately Doppler ultrasound today did not show any evidence of blood clot.  I do not see any signs of skin infection.  Continue to wear compressive hose, and take Tylenol as needed for pain.  Continue with your Xarelto blood thinner medication.

## 2020-08-07 NOTE — ED Triage Notes (Signed)
Patient request check for DVT in [facility]. Has remote hx of same. Pain with ambulation. No sob

## 2020-08-15 LAB — HM DIABETES EYE EXAM

## 2020-08-26 NOTE — Progress Notes (Signed)
Office Visit Note  Patient: [name]             Date of Birth: Oct 29, 1960           MRN: [id number]             PCP: Mateo Flow, MD Referring: Mateo Flow, MD Visit Date: 09/09/2020 Occupation: '@GUAROCC'$ @  Subjective:  Medication monitoring.   History of Present Illness: [name] is a 60 y.o. female with history of rheumatoid arthritis and osteoarthritis.  She states she had a flare about 6 weeks ago.  At the time she increased Plaquenil to 200 mg p.o. twice daily every day which helped her symptoms.  She reduced the Plaquenil dose again to Plaquenil 200 mg p.o. Monday to Friday.  She continues to have some stiffness in her hands.  Her left knee replacement is doing well.  She will be undergoing a right total knee replacement this year by Dr. Ninfa Linden.  Activities of Daily Living:  Patient reports morning stiffness for 30 minutes.   Patient Denies nocturnal pain.  Difficulty dressing/grooming: Denies Difficulty climbing stairs: Reports Difficulty getting out of chair: Denies Difficulty using hands for taps, buttons, cutlery, and/or writing: Reports  Review of Systems  Constitutional:  Positive for fatigue.  HENT:  Positive for mouth dryness. Negative for mouth sores and nose dryness.   Eyes:  Positive for visual disturbance and dryness. Negative for pain and itching.  Respiratory:  Negative for cough, hemoptysis, shortness of breath and difficulty breathing.   Cardiovascular:  Positive for palpitations and swelling in legs/feet. Negative for chest pain.  Gastrointestinal:  Positive for constipation. Negative for abdominal pain, blood in stool and diarrhea.  Endocrine: Negative for increased urination.  Genitourinary:  Negative for painful urination.  Musculoskeletal:  Positive for joint pain, joint pain, joint swelling and morning stiffness. Negative for myalgias, muscle weakness, muscle tenderness and myalgias.  Skin:  Negative for color change, rash and  redness.  Allergic/Immunologic: Negative for susceptible to infections.  Neurological:  Positive for weakness. Negative for dizziness, numbness, headaches and memory loss.  Hematological:  Negative for swollen glands.  Psychiatric/Behavioral:  Negative for confusion and sleep disturbance.    PMFS History:  Patient Active Problem List   Diagnosis Date Noted   Unilateral primary osteoarthritis, right knee 04/07/2020   Leg edema 08/19/2018   Presence of left artificial knee joint 08/14/2016   Unilateral primary osteoarthritis, left knee 07/31/2016   Status post total knee replacement, left 07/31/2016   Abdominal hematoma    Post-operative state    Paroxysmal atrial fibrillation ([place])    Controlled type 2 diabetes mellitus with diabetic nephropathy ([place])    Wound infection 01/25/2016   Hydronephrosis, left 01/25/2016   Hydronephrosis, right 01/25/2016   DVT (deep venous thrombosis) ([place]) 01/25/2016   Atrial fibrillation ([place]) 01/25/2016   Diabetes mellitus with complication ([place])    Wound infection after surgery, initial encounter    Varicose veins of lower extremities with complications [id number]   Pain and swelling of left lower leg 10/27/2013   Pain of left lower extremity 10/27/2013   Gastroenteritis, acute 09/08/2013   Fever, unspecified 09/04/2013   ASD (atrial septal defect) 08/13/2013   Pulmonary embolism ([place]) 06/22/2013   Dyspnea 06/11/2013   Anterior slip repaired Oct 2012 01/18/2011   GI bleed 12/18/2010   Diabetes mellitus ([place]) 12/18/2010   Varicose veins of lower extremities with other complications [id number]   Diabetes mellitus    HIP PAIN,  RIGHT 03/06/2010    Past Medical History:  Diagnosis Date   Anemia    Arthritis    Atrial fibrillation ([place])    Bleeding ulcer 12-2010  hospitalized for 1 week   Blood transfusion    Blood transfusion without reported diagnosis    2011-2 units transfused, bleeding ulcer   Bruises easily    CHF (congestive heart  failure) ([place])    with PE resolved after    Constipation    Difficulty in swallowing 03/24/2013   no longer a problem   Diverticulitis    DVT (deep venous thrombosis) ([place]) 06/2013   right leg   Dysrhythmia     Hx of A fib    GERD (gastroesophageal reflux disease)    Hyperlipidemia    Leg swelling 03/24/2013   not a problem any longer   Osteoarthritis    Peripheral vascular disease ([facility])    Pulmonary embolus ([place]) 06/2013   Reflux    Shortness of breath    with PE   Vomiting 03/24/2013   problem resolved after lap band revision    Family History  Problem Relation Age of Onset   Heart disease Father    Kidney disease Father    Peripheral vascular disease Father    Heart attack Father        80   Diabetes Mother    Arthritis Mother    Past Surgical History:  Procedure Laterality Date   APPENDECTOMY     [place]aud[place] Head     CHOLECYSTECTOMY     [id number]   COLONOSCOPY N/A 04/10/2013   Procedure: COLONOSCOPY;  Surgeon: Beryle Beams, MD;  Location: [facility];  Service: Endoscopy;  Laterality: N/A;   DENTAL SURGERY  10/2019   tooth implant   DIAGNOSTIC LAPAROSCOPY     ENDOVENOUS ABLATION SAPHENOUS VEIN W/ LASER  06-2008   ENDOVENOUS ABLATION SAPHENOUS VEIN W/ LASER Left 12-09-2013   EVLA LEFT GREATER SAPHENOUS VEIN BY TODD EARLY MD   ESOPHAGOGASTRODUODENOSCOPY  12/18/2010   Procedure: ESOPHAGOGASTRODUODENOSCOPY (EGD);  Surgeon: Gatha Mayer, MD;  Location: [facility];  Service: Endoscopy;  Laterality: N/A;   JOINT REPLACEMENT     LAPAROSCOPIC GASTRIC BANDING  01-05-2008   LAPAROSCOPIC GASTRIC BANDING  12/18/2010   01/04/2009   SALPHINGOOOPHORECTOMY Right    cyst removal   TONSILLECTOMY     TOTAL KNEE ARTHROPLASTY Left 07/31/2016   Procedure: LEFT TOTAL KNEE ARTHROPLASTY;  Surgeon: [name], MD;  Location: [facility];  Service: Orthopedics;  Laterality: Left;   WEDGE RESECTION OF OVARY  LEFT OVARY    Social History   Social History Narrative    Not on file   Immunization History  Administered Date(s) Administered   Influenza Split 10/06/2012   Influenza,inj,Quad PF,6+ Mos 01/30/2016   Moderna Sars-Covid-2 Vaccination 09/07/2019, 10/09/2019   Pneumococcal Conjugate-13 11/29/2015     Objective: Vital Signs: BP 129/67 (BP Location: Left Arm, Patient Position: Sitting, Cuff Size: Normal)   Pulse 71   Ht '5\' 6"'$  (1.676 m)   Wt (!) 333 lb (151 kg)   BMI 53.75 kg/m    Physical Exam Vitals and nursing note reviewed.  Constitutional:      Appearance: She is well-developed.  HENT:     Head: Normocephalic and atraumatic.  Eyes:     Conjunctiva/sclera: Conjunctivae normal.  Cardiovascular:     Rate and Rhythm: Normal rate and regular rhythm.     Heart sounds: Normal heart sounds.  Pulmonary:  Effort: Pulmonary effort is normal.     Breath sounds: Normal breath sounds.  Abdominal:     General: Bowel sounds are normal.     Palpations: Abdomen is soft.  Musculoskeletal:     Cervical back: Normal range of motion.  Lymphadenopathy:     Cervical: No cervical adenopathy.  Skin:    General: Skin is warm and dry.     Capillary Refill: Capillary refill takes less than 2 seconds.  Neurological:     Mental Status: She is alert and oriented to person, place, and time.  Psychiatric:        Behavior: Behavior normal.     Musculoskeletal Exam: C-spine was in good range of motion.  Shoulder joints, elbow joints, wrist joints, MCPs PIPs and DIPs with good range of motion with no synovitis.  Hip joints in good range of motion.  Left knee joint is replaced.  She had some discomfort range of motion of her right knee joint.  There is no tenderness over ankles or MTPs.  CDAI Exam: CDAI Score: 0.4  Patient Global: 2 mm; Provider Global: 2 mm Swollen: 0 ; Tender: 0  Joint Exam 09/09/2020   No joint exam has been documented for this visit   There is currently no information documented on the homunculus. Go to the Rheumatology  activity and complete the homunculus joint exam.  Investigation: No additional findings.  Imaging: No results found.  Recent Labs: Lab Results  Component Value Date   WBC 5.0 04/07/2020   HGB 12.6 04/07/2020   PLT 231 04/07/2020   NA 139 04/07/2020   K 4.4 04/07/2020   CL 106 04/07/2020   CO2 26 04/07/2020   GLUCOSE 117 (H) 04/07/2020   BUN 10 04/07/2020   CREATININE 0.66 04/07/2020   BILITOT 0.4 04/07/2020   ALKPHOS 58 12/14/2018   AST 30 04/07/2020   ALT 21 04/07/2020   PROT 6.5 04/07/2020   ALBUMIN 3.4 (L) 12/14/2018   CALCIUM 8.7 04/07/2020   GFRAA 111 04/07/2020    Speciality Comments: PLQ eye exam: 08/15/2020 Normal. [facility]. Follow up in 6 months   Procedures:  No procedures performed Allergies: Contrast media [iodinated diagnostic agents], Penicillins, and Ancef [cefazolin sodium]   Assessment / Plan:     Visit Diagnoses: Rheumatoid arthritis with rheumatoid factor of multiple sites without organ or systems involvement ([place]) - Positive rheumatoid factor, negative ANA, negative CCP: Patient had a flare about 6 weeks ago which responded to increased dose of Plaquenil.  She reduced her Plaquenil dose again to 1 tablet p.o. twice daily Monday to Friday.  I advised her to increase the dose to Plaquenil 1 tablet p.o. twice daily for now.  We will check her Plaquenil level at the next blood work.  High risk medication use - Plaquenil 200 mg 1 tablet by mouth twice daily Monday to Friday. PLQ eye exam: 08/15/2020 - Plan: CBC with Differential/Platelet, COMPLETE METABOLIC PANEL WITH GFR today  S/P total knee replacement, left - Performed by Dr. Ninfa Linden in June 2018.   Primary osteoarthritis of right knee-she is having difficulty with mobility due to right knee joint pain.  She will be undergoing right total knee replacement this year.  Other medical problems are listed as follows:  Paroxysmal atrial fibrillation ([facility])  ASD (atrial septal  defect)  Hydronephrosis, left  Hydronephrosis, right  History of diverticulitis  Controlled type 2 diabetes mellitus with diabetic nephropathy, unspecified whether long term insulin use ([place])  History of DVT (  deep vein thrombosis)  Varicose veins of left lower extremity with complications  Orders: Orders Placed This Encounter  Procedures   CBC with Differential/Platelet   COMPLETE METABOLIC PANEL WITH GFR    Meds ordered this encounter  Medications   hydroxychloroquine (PLAQUENIL) 200 MG tablet    Sig: Take 1 tablet (200 mg total) by mouth 2 (two) times daily.    Dispense:  180 tablet    Refill:  0     Follow-Up Instructions: Return in about 5 months (around 02/09/2021) for Rheumatoid arthritis.   Bo Merino, MD  Note - This record has been created using Editor, commissioning.  Chart creation errors have been sought, but may not always  have been located. Such creation errors do not reflect on  the standard of medical care.

## 2020-09-09 ENCOUNTER — Ambulatory Visit (INDEPENDENT_AMBULATORY_CARE_PROVIDER_SITE_OTHER): Payer: No Typology Code available for payment source | Admitting: Rheumatology

## 2020-09-09 ENCOUNTER — Encounter: Payer: Self-pay | Admitting: Rheumatology

## 2020-09-09 ENCOUNTER — Other Ambulatory Visit: Payer: Self-pay

## 2020-09-09 VITALS — BP 129/67 | HR 71 | Ht 66.0 in | Wt 333.0 lb

## 2020-09-09 DIAGNOSIS — M1711 Unilateral primary osteoarthritis, right knee: Secondary | ICD-10-CM | POA: Diagnosis not present

## 2020-09-09 DIAGNOSIS — Q211 Atrial septal defect, unspecified: Secondary | ICD-10-CM

## 2020-09-09 DIAGNOSIS — I480 Paroxysmal atrial fibrillation: Secondary | ICD-10-CM

## 2020-09-09 DIAGNOSIS — Z96652 Presence of left artificial knee joint: Secondary | ICD-10-CM

## 2020-09-09 DIAGNOSIS — Z79899 Other long term (current) drug therapy: Secondary | ICD-10-CM

## 2020-09-09 DIAGNOSIS — I83892 Varicose veins of left lower extremities with other complications: Secondary | ICD-10-CM

## 2020-09-09 DIAGNOSIS — Z86718 Personal history of other venous thrombosis and embolism: Secondary | ICD-10-CM

## 2020-09-09 DIAGNOSIS — M0579 Rheumatoid arthritis with rheumatoid factor of multiple sites without organ or systems involvement: Secondary | ICD-10-CM

## 2020-09-09 DIAGNOSIS — Z8719 Personal history of other diseases of the digestive system: Secondary | ICD-10-CM

## 2020-09-09 DIAGNOSIS — E1121 Type 2 diabetes mellitus with diabetic nephropathy: Secondary | ICD-10-CM

## 2020-09-09 DIAGNOSIS — N1330 Unspecified hydronephrosis: Secondary | ICD-10-CM

## 2020-09-09 MED ORDER — HYDROXYCHLOROQUINE SULFATE 200 MG PO TABS: 200.0000 mg | ORAL_TABLET | Freq: Two times a day (BID) | ORAL | 0 refills | Status: DC

## 2020-09-09 NOTE — Patient Instructions (Signed)
Journal for Nurse Practitioners, 15(4), 263-267. Retrieved November 11, 2017 from http://clinicalkey.com/nursing">  Knee Exercises Ask your health care provider which exercises are safe for you. Do exercises exactly as told by your health care provider and adjust them as directed. It is normal to feel mild stretching, pulling, tightness, or discomfort as you do these exercises. Stop right away if you feel sudden pain or your pain gets worse. Do not begin these exercises until told by your health care provider. Stretching and range-of-motion exercises These exercises warm up your muscles and joints and improve the movement and flexibility of your knee. These exercises also help to relieve pain andswelling. Knee extension, prone Lie on your abdomen (prone position) on a bed. Place your left / right knee just beyond the edge of the surface so your knee is not on the bed. You can put a towel under your left / right thigh just above your kneecap for comfort. Relax your leg muscles and allow gravity to straighten your knee (extension). You should feel a stretch behind your left / right knee. Hold this position for __________ seconds. Scoot up so your knee is supported between repetitions. Repeat __________ times. Complete this exercise __________ times a day. Knee flexion, active  Lie on your back with both legs straight. If this causes back discomfort, bend your left / right knee so your foot is flat on the floor. Slowly slide your left / right heel back toward your buttocks. Stop when you feel a gentle stretch in the front of your knee or thigh (flexion). Hold this position for __________ seconds. Slowly slide your left / right heel back to the starting position. Repeat __________ times. Complete this exercise __________ times a day. Quadriceps stretch, prone  Lie on your abdomen on a firm surface, such as a bed or padded floor. Bend your left / right knee and hold your ankle. If you cannot reach  your ankle or pant leg, loop a belt around your foot and grab the belt instead. Gently pull your heel toward your buttocks. Your knee should not slide out to the side. You should feel a stretch in the front of your thigh and knee (quadriceps). Hold this position for __________ seconds. Repeat __________ times. Complete this exercise __________ times a day. Hamstring, supine Lie on your back (supine position). Loop a belt or towel over the ball of your left / right foot. The ball of your foot is on the walking surface, right under your toes. Straighten your left / right knee and slowly pull on the belt to raise your leg until you feel a gentle stretch behind your knee (hamstring). Do not let your knee bend while you do this. Keep your other leg flat on the floor. Hold this position for __________ seconds. Repeat __________ times. Complete this exercise __________ times a day. Strengthening exercises These exercises build strength and endurance in your knee. Endurance is theability to use your muscles for a long time, even after they get tired. Quadriceps, isometric This exercise stretches the muscles in front of your thigh (quadriceps) without moving your knee joint (isometric). Lie on your back with your left / right leg extended and your other knee bent. Put a rolled towel or small pillow under your knee if told by your health care provider. Slowly tense the muscles in the front of your left / right thigh. You should see your kneecap slide up toward your hip or see increased dimpling just above the knee. This motion will   push the back of the knee toward the floor. For __________ seconds, hold the muscle as tight as you can without increasing your pain. Relax the muscles slowly and completely. Repeat __________ times. Complete this exercise __________ times a day. Straight leg raises This exercise stretches the muscles in front of your thigh (quadriceps) and the muscles that move your hips (hip  flexors). Lie on your back with your left / right leg extended and your other knee bent. Tense the muscles in the front of your left / right thigh. You should see your kneecap slide up or see increased dimpling just above the knee. Your thigh may even shake a bit. Keep these muscles tight as you raise your leg 4-6 inches (10-15 cm) off the floor. Do not let your knee bend. Hold this position for __________ seconds. Keep these muscles tense as you lower your leg. Relax your muscles slowly and completely after each repetition. Repeat __________ times. Complete this exercise __________ times a day. Hamstring, isometric Lie on your back on a firm surface. Bend your left / right knee about __________ degrees. Dig your left / right heel into the surface as if you are trying to pull it toward your buttocks. Tighten the muscles in the back of your thighs (hamstring) to "dig" as hard as you can without increasing any pain. Hold this position for __________ seconds. Release the tension gradually and allow your muscles to relax completely for __________ seconds after each repetition. Repeat __________ times. Complete this exercise __________ times a day. Hamstring curls If told by your health care provider, do this exercise while wearing ankle weights. Begin with __________ lb weights. Then increase the weight by 1 lb (0.5 kg) increments. Do not wear ankle weights that are more than __________ lb. Lie on your abdomen with your legs straight. Bend your left / right knee as far as you can without feeling pain. Keep your hips flat against the floor. Hold this position for __________ seconds. Slowly lower your leg to the starting position. Repeat __________ times. Complete this exercise __________ times a day. Squats This exercise strengthens the muscles in front of your thigh and knee (quadriceps). Stand in front of a table, with your feet and knees pointing straight ahead. You may rest your hands on the  table for balance but not for support. Slowly bend your knees and lower your hips like you are going to sit in a chair. Keep your weight over your heels, not over your toes. Keep your lower legs upright so they are parallel with the table legs. Do not let your hips go lower than your knees. Do not bend lower than told by your health care provider. If your knee pain increases, do not bend as low. Hold the squat position for __________ seconds. Slowly push with your legs to return to standing. Do not use your hands to pull yourself to standing. Repeat __________ times. Complete this exercise __________ times a day. Wall slides This exercise strengthens the muscles in front of your thigh and knee (quadriceps). Lean your back against a smooth wall or door, and walk your feet out 18-24 inches (46-61 cm) from it. Place your feet hip-width apart. Slowly slide down the wall or door until your knees bend __________ degrees. Keep your knees over your heels, not over your toes. Keep your knees in line with your hips. Hold this position for __________ seconds. Repeat __________ times. Complete this exercise __________ times a day. Straight leg raises This exercise   strengthens the muscles that rotate the leg at the hip and move it away from your body (hip abductors). Lie on your side with your left / right leg in the top position. Lie so your head, shoulder, knee, and hip line up. You may bend your bottom knee to help you keep your balance. Roll your hips slightly forward so your hips are stacked directly over each other and your left / right knee is facing forward. Leading with your heel, lift your top leg 4-6 inches (10-15 cm). You should feel the muscles in your outer hip lifting. Do not let your foot drift forward. Do not let your knee roll toward the ceiling. Hold this position for __________ seconds. Slowly return your leg to the starting position. Let your muscles relax completely after each  repetition. Repeat __________ times. Complete this exercise __________ times a day. Straight leg raises This exercise stretches the muscles that move your hips away from the front of the pelvis (hip extensors). Lie on your abdomen on a firm surface. You can put a pillow under your hips if that is more comfortable. Tense the muscles in your buttocks and lift your left / right leg about 4-6 inches (10-15 cm). Keep your knee straight as you lift your leg. Hold this position for __________ seconds. Slowly lower your leg to the starting position. Let your leg relax completely after each repetition. Repeat __________ times. Complete this exercise __________ times a day. This information is not intended to replace advice given to you by your health care provider. Make sure you discuss any questions you have with your healthcare provider. Document Revised: 11/12/2017 Document Reviewed: 11/12/2017 Elsevier Patient Education  2022 [employer] Hand Exercises Hand exercises can be helpful for almost anyone. These exercises can strengthen the hands, improve flexibility and movement, and increase blood flow to the hands. These results can make work and daily tasks easier. Hand exercises can be especially helpful for people who have joint pain from arthritis or have nerve damage from overuse (carpal tunnel syndrome). These exercises can also help people who have injured a hand. Exercises Most of these hand exercises are gentle stretching and motion exercises. It is usually safe to do them often throughout the day. Warming up your hands before exercise may help to reduce stiffness. You can do this with gentle massage orby placing your hands in warm water for 10-15 minutes. It is normal to feel some stretching, pulling, tightness, or mild discomfort as you begin new exercises. This will gradually improve. Stop an exercise right away if you feel sudden, severe pain or your pain gets worse. Ask your healthcare  provider which exercises are best for you. Knuckle bend or "claw" fist Stand or sit with your arm, hand, and all five fingers pointed straight up. Make sure to keep your wrist straight during the exercise. Gently bend your fingers down toward your palm until the tips of your fingers are touching the top of your palm. Keep your big knuckle straight and just bend the small knuckles in your fingers. Hold this position for __________ seconds. Straighten (extend) your fingers back to the starting position. Repeat this exercise 5-10 times with each hand. Full finger fist Stand or sit with your arm, hand, and all five fingers pointed straight up. Make sure to keep your wrist straight during the exercise. Gently bend your fingers into your palm until the tips of your fingers are touching the middle of your palm. Hold this position for __________ seconds.   Extend your fingers back to the starting position, stretching every joint fully. Repeat this exercise 5-10 times with each hand. Straight fist Stand or sit with your arm, hand, and all five fingers pointed straight up. Make sure to keep your wrist straight during the exercise. Gently bend your fingers at the big knuckle, where your fingers meet your hand, and the middle knuckle. Keep the knuckle at the tips of your fingers straight and try to touch the bottom of your palm. Hold this position for __________ seconds. Extend your fingers back to the starting position, stretching every joint fully. Repeat this exercise 5-10 times with each hand. Tabletop Stand or sit with your arm, hand, and all five fingers pointed straight up. Make sure to keep your wrist straight during the exercise. Gently bend your fingers at the big knuckle, where your fingers meet your hand, as far down as you can while keeping the small knuckles in your fingers straight. Think of forming a tabletop with your fingers. Hold this position for __________ seconds. Extend your fingers  back to the starting position, stretching every joint fully. Repeat this exercise 5-10 times with each hand. Finger spread Place your hand flat on a table with your palm facing down. Make sure your wrist stays straight as you do this exercise. Spread your fingers and thumb apart from each other as far as you can until you feel a gentle stretch. Hold this position for __________ seconds. Bring your fingers and thumb tight together again. Hold this position for __________ seconds. Repeat this exercise 5-10 times with each hand. Making circles Stand or sit with your arm, hand, and all five fingers pointed straight up. Make sure to keep your wrist straight during the exercise. Make a circle by touching the tip of your thumb to the tip of your index finger. Hold for __________ seconds. Then open your hand wide. Repeat this motion with your thumb and each finger on your hand. Repeat this exercise 5-10 times with each hand. Thumb motion Sit with your forearm resting on a table and your wrist straight. Your thumb should be facing up toward the ceiling. Keep your fingers relaxed as you move your thumb. Lift your thumb up as high as you can toward the ceiling. Hold for __________ seconds. Bend your thumb across your palm as far as you can, reaching the tip of your thumb for the small finger (pinkie) side of your palm. Hold for __________ seconds. Repeat this exercise 5-10 times with each hand. Grip strengthening  Hold a stress ball or other soft ball in the middle of your hand. Slowly increase the pressure, squeezing the ball as much as you can without causing pain. Think of bringing the tips of your fingers into the middle of your palm. All of your finger joints should bend when doing this exercise. Hold your squeeze for __________ seconds, then relax. Repeat this exercise 5-10 times with each hand. Contact a health care provider if: Your hand pain or discomfort gets much worse when you do an  exercise. Your hand pain or discomfort does not improve within 2 hours after you exercise. If you have any of these problems, stop doing these exercises right away. Do not do them again unless your health care provider says that you can. Get help right away if: You develop sudden, severe hand pain or swelling. If this happens, stop doing these exercises right away. Do not do them again unless your health care provider says that you can. This   information is not intended to replace advice given to you by your health care provider. Make sure you discuss any questions you have with your healthcare provider. Document Revised: 05/15/2018 Document Reviewed: 01/23/2018 Elsevier Patient Education  2022 [place].  Heart Disease Prevention   Your inflammatory disease increases your risk of heart disease which includes heart attack, stroke, atrial fibrillation (irregular heartbeats), high blood pressure, heart failure and atherosclerosis (plaque in the arteries).  It is important to reduce your risk by:   Keep blood pressure, cholesterol, and blood sugar at healthy levels   Smoking Cessation   Maintain a healthy weight  BMI 20-25   Eat a healthy diet  Plenty of fresh fruit, vegetables, and whole grains  Limit saturated fats, foods high in sodium, and added sugars  DASH and Mediterranean diet   Increase physical activity  Recommend moderate physically activity for 150 minutes per week/ 30 minutes a day for five days a week These can be broken up into three separate ten-minute sessions during the day.   Reduce Stress  Meditation, slow breathing exercises, yoga, coloring books  Dental visits twice a year

## 2020-09-10 LAB — CBC WITH DIFFERENTIAL/PLATELET
Absolute Monocytes: 296 cells/uL (ref 200–950)
Basophils Absolute: 59 cells/uL (ref 0–200)
Basophils Relative: 1.6 %
Eosinophils Absolute: 148 cells/uL (ref 15–500)
Eosinophils Relative: 4 %
HCT: 39.8 % (ref 35.0–45.0)
Hemoglobin: 12.4 g/dL (ref 11.7–15.5)
Lymphs Abs: 1447 cells/uL (ref 850–3900)
MCH: 24.5 pg — ABNORMAL LOW (ref 27.0–33.0)
MCHC: 31.2 g/dL — ABNORMAL LOW (ref 32.0–36.0)
MCV: 78.5 fL — ABNORMAL LOW (ref 80.0–100.0)
MPV: 10.7 fL (ref 7.5–12.5)
Monocytes Relative: 8 %
Neutro Abs: 1750 cells/uL (ref 1500–7800)
Neutrophils Relative %: 47.3 %
Platelets: 200 10*3/uL (ref 140–400)
RBC: 5.07 10*6/uL (ref 3.80–5.10)
RDW: 16.5 % — ABNORMAL HIGH (ref 11.0–15.0)
Total Lymphocyte: 39.1 %
WBC: 3.7 10*3/uL — ABNORMAL LOW (ref 3.8–10.8)

## 2020-09-10 LAB — COMPLETE METABOLIC PANEL WITH GFR
AG Ratio: 1.5 (calc) (ref 1.0–2.5)
ALT: 28 U/L (ref 6–29)
AST: 33 U/L (ref 10–35)
Albumin: 3.9 g/dL (ref 3.6–5.1)
Alkaline phosphatase (APISO): 45 U/L (ref 37–153)
BUN: 9 mg/dL (ref 7–25)
CO2: 26 mmol/L (ref 20–32)
Calcium: 8.9 mg/dL (ref 8.6–10.4)
Chloride: 107 mmol/L (ref 98–110)
Creat: 0.65 mg/dL (ref 0.50–1.05)
Globulin: 2.6 g/dL (calc) (ref 1.9–3.7)
Glucose, Bld: 95 mg/dL (ref 65–99)
Potassium: 4.4 mmol/L (ref 3.5–5.3)
Sodium: 140 mmol/L (ref 135–146)
Total Bilirubin: 0.5 mg/dL (ref 0.2–1.2)
Total Protein: 6.5 g/dL (ref 6.1–8.1)
eGFR: 100 mL/min/{1.73_m2} (ref >=60)

## 2020-09-10 NOTE — Progress Notes (Signed)
WBC count is mildly decreased , MCV is low most likely due to iron deficiency , CMP is normal.

## 2020-10-10 ENCOUNTER — Other Ambulatory Visit: Payer: Self-pay | Admitting: Rheumatology

## 2020-10-11 ENCOUNTER — Ambulatory Visit (INDEPENDENT_AMBULATORY_CARE_PROVIDER_SITE_OTHER): Payer: No Typology Code available for payment source | Admitting: Orthopaedic Surgery

## 2020-10-11 ENCOUNTER — Other Ambulatory Visit: Payer: Self-pay

## 2020-10-11 ENCOUNTER — Encounter: Payer: Self-pay | Admitting: Orthopaedic Surgery

## 2020-10-11 VITALS — Ht 66.0 in | Wt 333.0 lb

## 2020-10-11 DIAGNOSIS — M1711 Unilateral primary osteoarthritis, right knee: Secondary | ICD-10-CM | POA: Diagnosis not present

## 2020-10-11 DIAGNOSIS — G8929 Other chronic pain: Secondary | ICD-10-CM | POA: Diagnosis not present

## 2020-10-11 DIAGNOSIS — M25561 Pain in right knee: Secondary | ICD-10-CM | POA: Diagnosis not present

## 2020-10-11 NOTE — Progress Notes (Signed)
Office Visit Note   Patient: [name]           Date of Birth: 10-Oct-1960           MRN: [id number] Visit Date: 10/11/2020              Requested by: Mateo Flow, MD [place],  [place] [address] PCP: Mateo Flow, MD   Assessment & Plan: Visit Diagnoses:  1. Chronic pain of right knee   2. Unilateral primary osteoarthritis, right knee     Plan: At this point we will work on getting her scheduled for right total knee arthroplasty.  Having had this done before she is fully aware of the risks and benefits of surgery.  We talked about intraoperative and postoperative course.  Given her BMI of 53 there is a high failure rate as well but she has done well with her left knee and not sure what else we can do for her other than consider a right total knee arthroplasty at this point.  We will work on getting this scheduled.  Follow-Up Instructions: Return for 2 weeks post-op.   Orders:  No orders of the defined types were placed in this encounter.  No orders of the defined types were placed in this encounter.     Procedures: No procedures performed   Clinical Data: No additional findings.   Subjective: Chief Complaint  Patient presents with   Right Knee - Follow-up  [name] is well-known to me.  We actually replaced her left knee remotely.  She has well-documented end-stage arthritis of her right knee.  She has tried and failed conservative treatment for several years now with the right knee including activity modification, weight loss, steroid injections and hyaluronic acid injections.  Her x-rays show varus malalignment of the knee with bone-on-bone wear.  At this point her pain is daily with the right knee and it is detrimentally affecting her mobility, her quality of life and actives daily living.  She is at the point she is frustrated and does wish to proceed with a total knee arthroplasty on the right knee.  She has had a good outcome thus far with her  left total knee replacement that again was done several years ago.  She is a diabetic but reports good diabetic control.  She denies any other active medical issues.  HPI  Review of Systems She currently denies any headache, chest pain, shortness of breath, fever, chills, nausea, vomiting  Objective: Vital Signs: Ht '5\' 6"'$  (1.676 m)   Wt (!) 333 lb (151 kg)   BMI 53.75 kg/m   Physical Exam She is alert and orient x3 and in no acute distress Ortho Exam Examination of her right knee shows painful arc of motion of the knee with mild effusion.  There is patellofemoral crepitation and varus malalignment.  The knee is ligamentously stable. Specialty Comments:  No specialty comments available.  Imaging: No results found. Previous x-rays of the right knee are reviewed and show severe end-stage arthritis of the right knee with bone-on-bone wear of the medial compartment, varus alignment and significant loss of patellofemoral joint space.  There are osteophytes in all 3 compartments.  PMFS History: Patient Active Problem List   Diagnosis Date Noted   Unilateral primary osteoarthritis, right knee 04/07/2020   Leg edema 08/19/2018   Presence of left artificial knee joint 08/14/2016   Unilateral primary osteoarthritis, left knee 07/31/2016   Status post total knee replacement,  left 07/31/2016   Abdominal hematoma    Post-operative state    Paroxysmal atrial fibrillation (HCC)    Controlled type 2 diabetes mellitus with diabetic nephropathy ([place])    Wound infection 01/25/2016   Hydronephrosis, left 01/25/2016   Hydronephrosis, right 01/25/2016   DVT (deep venous thrombosis) ([place]) 01/25/2016   Atrial fibrillation ([place]) 01/25/2016   Diabetes mellitus with complication ([place])    Wound infection after surgery, initial encounter    Varicose veins of lower extremities with complications [id number]   Pain and swelling of left lower leg 10/27/2013   Pain of left lower extremity 10/27/2013    Gastroenteritis, acute 09/08/2013   Fever, unspecified 09/04/2013   ASD (atrial septal defect) 08/13/2013   Pulmonary embolism ([place]) 06/22/2013   Dyspnea 06/11/2013   Anterior slip repaired Oct 2012 01/18/2011   GI bleed 12/18/2010   Diabetes mellitus ([place]) 12/18/2010   Varicose veins of lower extremities with other complications [id number]   Diabetes mellitus    HIP PAIN, RIGHT 03/06/2010   Past Medical History:  Diagnosis Date   Anemia    Arthritis    Atrial fibrillation ([place])    Bleeding ulcer 12-2010  hospitalized for 1 week   Blood transfusion    Blood transfusion without reported diagnosis    2011-2 units transfused, bleeding ulcer   Bruises easily    CHF (congestive heart failure) ([place])    with PE resolved after    Constipation    Difficulty in swallowing 03/24/2013   no longer a problem   Diverticulitis    DVT (deep venous thrombosis) ([place]) 06/2013   right leg   Dysrhythmia     Hx of A fib    GERD (gastroesophageal reflux disease)    Hyperlipidemia    Leg swelling 03/24/2013   not a problem any longer   Osteoarthritis    Peripheral vascular disease (HCC)    Pulmonary embolus ([place]) 06/2013   Reflux    Shortness of breath    with PE   Vomiting 03/24/2013   problem resolved after lap band revision    Family History  Problem Relation Age of Onset   Heart disease Father    Kidney disease Father    Peripheral vascular disease Father    Heart attack Father        59   Diabetes Mother    Arthritis Mother     Past Surgical History:  Procedure Laterality Date   APPENDECTOMY     Claudette Head     CHOLECYSTECTOMY     [id number]   COLONOSCOPY N/A 04/10/2013   Procedure: COLONOSCOPY;  Surgeon: Beryle Beams, MD;  Location: [facility];  Service: Endoscopy;  Laterality: N/A;   DENTAL SURGERY  10/2019   tooth implant   DIAGNOSTIC LAPAROSCOPY     ENDOVENOUS ABLATION SAPHENOUS VEIN W/ LASER  06-2008   ENDOVENOUS ABLATION SAPHENOUS VEIN W/ LASER Left  12-09-2013   EVLA LEFT GREATER SAPHENOUS VEIN BY TODD EARLY MD   ESOPHAGOGASTRODUODENOSCOPY  12/18/2010   Procedure: ESOPHAGOGASTRODUODENOSCOPY (EGD);  Surgeon: Gatha Mayer, MD;  Location: [facility];  Service: Endoscopy;  Laterality: N/A;   JOINT REPLACEMENT     LAPAROSCOPIC GASTRIC BANDING  01-05-2008   LAPAROSCOPIC GASTRIC BANDING  12/18/2010   01/04/2009   SALPHINGOOOPHORECTOMY Right    cyst removal   TONSILLECTOMY     TOTAL KNEE ARTHROPLASTY Left 07/31/2016   Procedure: LEFT TOTAL KNEE ARTHROPLASTY;  Surgeon: [name], MD;  Location: [facility];  Service: Orthopedics;  Laterality: Left;   WEDGE RESECTION OF OVARY  LEFT OVARY    Social History   Occupational History   Occupation: Programmer, multimedia: [employer]  Tobacco Use   Smoking status: Never   Smokeless tobacco: Never  Vaping Use   Vaping Use: Never used  Substance and Sexual Activity   Alcohol use: No    Alcohol/week: 0.0 standard drinks   Drug use: No   Sexual activity: Yes    Partners: Male    Birth control/protection: None

## 2020-10-17 ENCOUNTER — Telehealth: Payer: Self-pay

## 2020-10-17 NOTE — Telephone Encounter (Signed)
Patient called to ask if Dr. Estanislado Pandy could fill out her [place] paperwork for intermittent absence of her RA symptoms.  Patient states her PCP usually fills out the form, but he has Covid and will be out of the office for a while. Patient advised to contact the PCP's office and speak with them to see who is covering for PCP while he is out. Advised her that they should be able to complete the paperwork. Patient advised if she has trouble then to call the office and we would send a message to Dr. Estanislado Pandy to discuss. Patient expressed understanding.

## 2020-10-17 NOTE — Telephone Encounter (Signed)
Attempted to contact patient and left message for patient to call the office.  

## 2020-10-17 NOTE — Telephone Encounter (Signed)
Patient called to ask if Dr. Estanislado Pandy could fill out her [place] paperwork for intermittent absence of her RA symptoms.  Patient states her PCP usually fills out the form, but he has Covid and will be out of the office for a while.  Patient requested a return call.

## 2020-10-27 ENCOUNTER — Encounter: Payer: Self-pay | Admitting: Cardiovascular Disease

## 2020-10-27 NOTE — Progress Notes (Signed)
Date:  10/28/2020   ID:  [name], DOB Jun 16, 1960, MRN [id number]  Patient Location: Home Provider Location: Home  PCP:  Mateo Flow, MD  Cardiologist:  Mertie Moores, MD  Electrophysiologist:  None   Problem List Problem list 1. Pulmonary embolus 2. Atrial fibrillation 3.   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  The patient has an interesting cardiopulmonary history.  She was in her usual state of health until late April 2015 when she began to have pulmonary congestion symptoms and thought that she had the flu.  She saw her PCP in [place] several times initially her oxygen saturation at the first visit was 96% and the second visit was 92%.  She continued to worsen and collapsed at home.  She was brought to [facility] where in the emergency room her d-dimer was 13 and she underwent a CT angiogram which showed massive pulmonary embolus with saddle embolus.  She had slight elevation of her troponins consistent with pulmonary embolus.  Her initial echocardiogram on 06/11/13 showed a moderate hypertension with a pulmonary artery pressure of 41.  The ventricular septum showed paradoxic motion consistent with right ventricular volume overload.  There was a probable medium sized ostium secundum ASD present.  The right atrium was markedly dilated.  The patient went on to be treated with transcatheter ultrasound assisted thrombolytic infusion therapy for her saddle embolus.  She had a good clinical response.  She had a followup echocardiogram on 06/22/13 which showed that her pulmonary artery pressure had returned to normal at 16.  The right atrial size was normal.  The right ventricular size was normal.  There was no evidence of ASD or PFO. The patient was evaluated for hypercoagulable state as the cause of her pulmonary embolus and her DVT of her right leg.  However the hypercoagulability workup was negative.  The patient does not have any family history of  abnormal blood clotting. The patient herself has no prior cardiac history.  She does have a past history of morbid obesity and has had lap band surgery by Dr. Johnathan Hausen.  The patient initially weighed about 388 pounds.  Since her surgery she has lost approximately 130 pounds. She still is experiencing mild exertional dyspnea.  She denies any chest discomfort.  She has not been aware of any racing of her heart.   May 06, 2015 Was recently found have atrial fibrillation. She's been having some increased shortness of breath and fatigue. She came to the [facility] emergency room and was found have atrial fibrillation at rate of 154. Was treated with Diltiazem and metoprolol and she feels much better.   Thinks that she has converted .   July 10, 2016:   [name] is here for pre- op clearance for left knee replacement  Has not any noticeable episodes of Afib .  On Xarelto .   July 22, 2017:   Had an episode of PAF after taking cold meds.  Resolved after fluid, rest, and an extra metoprolol  Has mild swelling in feet  Is not getting exercise, sits most of the day  Wt. Is 276.    Evaluation Performed:  Follow-Up Visit  Chief Complaint:  Follow up PAF and hx of pulmonary embolus  July 17, 2018   [name] is a 60 y.o. female with a history of paroxysmal atrial fibrillation and a history of pulmonary embolus.  Several weeks ago woke up with AF,  Dyspnea,  Lasted for 45 min Took 2nd metoprolol at that time Developed leg swelling and shortness of breath Was started on lasix  Leg edema has not improved despite lasix  ( she showed me her legs on camara )  Left >> right leg swelling   The patient does not have symptoms concerning for COVID-19 infection (fever, chills, cough, or new shortness of breath).    August 19, 2018:  [name] is seen today for follow up visit.     Wt is 317 lbs  - up 40 lbs  Had lap band surgery 10 years ago .   We saw her in June - telemedicine visit  -   Had developed 2+ leg edema  We changed the lasix to Torsemide.  Echo for normal left ventricular systolic function.  Ejection fraction is 60 to 65%.  Normal diastolic function.  Mild thickening of the aortic valve.  Event monitor showed rare episoes of NSVT.     Jan. 28, 2021  Last visit was via telemedicine  Doing well from a cardiac standpont  Her event monitor revealed 5 beats of supraventricular tachycardia.  She seems to be much better on the Toprol. Has rare episodes of palpitations that only last for a second or 2.  Not associated with syncope or presyncope. No exercise,  Works at [employer].    Had an RA flare this past summer .   Arthritis is better on Plaquenil  Has developed diverticulitis    Sept. 23, 2022 [name] is seen for follow upf visit  - hx of DVT / saddle pulmonary embolism  Has had some palpitations - was found to have SVT- last for only a second or 2 Wt is 334 lbs  Has started diabetes medication  Hb A1C was 5.8  Going for R knee replacement soon She is at low risk for this surgery  OK to hold Xarelto - will defer to Pharm D for  timing / duration . She has used Lovenox bridging in the past if the decision is made to bridge her .   Past Medical History:  Diagnosis Date   Anemia    Arthritis    Atrial fibrillation ([place])    Bleeding ulcer 12-2010  hospitalized for 1 week   Blood transfusion    Blood transfusion without reported diagnosis    2011-2 units transfused, bleeding ulcer   Bruises easily    CHF (congestive heart failure) ([place])    with PE resolved after    Constipation    Difficulty in swallowing 03/24/2013   no longer a problem   Diverticulitis    DVT (deep venous thrombosis) ([place]) 06/2013   right leg   Dysrhythmia     Hx of A fib    GERD (gastroesophageal reflux disease)    Hyperlipidemia    Leg swelling 03/24/2013   not a problem any longer   Osteoarthritis    Peripheral vascular disease ([place])    Pulmonary embolus ([place]) 06/2013    Reflux    Shortness of breath    with PE   Vomiting 03/24/2013   problem resolved after lap band revision   Past Surgical History:  Procedure Laterality Date   APPENDECTOMY     [place]     CHOLECYSTECTOMY     5'12   COLONOSCOPY N/A 04/10/2013   Procedure: COLONOSCOPY;  Surgeon: Beryle Beams, MD;  Location: [facility] END[facility]COPY;  Service: Endoscopy;  Laterality: N/A;   DENTAL SURGERY  10/2019   tooth  implant   DIAGNOSTIC LAPAROSCOPY     ENDOVENOUS ABLATION SAPHENOUS VEIN W/ LASER  06-2008   ENDOVENOUS ABLATION SAPHENOUS VEIN W/ LASER Left 12-09-2013   EVLA LEFT GREATER SAPHENOUS VEIN BY TODD EARLY MD   ESOPHAGOGASTRODUODENOSCOPY  12/18/2010   Procedure: ESOPHAGOGASTRODUODENOSCOPY (EGD);  Surgeon: Gatha Mayer, MD;  Location: [facility];  Service: Endoscopy;  Laterality: N/A;   JOINT REPLACEMENT     LAPAROSCOPIC GASTRIC BANDING  01-05-2008   LAPAROSCOPIC GASTRIC BANDING  12/18/2010   01/04/2009   SALPHINGOOOPHORECTOMY Right    cyst removal   TONSILLECTOMY     TOTAL KNEE ARTHROPLASTY Left 07/31/2016   Procedure: LEFT TOTAL KNEE ARTHROPLASTY;  Surgeon: [name], MD;  Location: [place];  Service: Orthopedics;  Laterality: Left;   WEDGE RESECTION OF OVARY  LEFT OVARY      Current Meds  Medication Sig   Artificial Tear Ointment (DRY EYES OP) Apply 1 drop to eye daily as needed (for dry eyes).   Cholecalciferol (VITAMIN D-3) 125 MCG (5000 UT) TABS Take 1 tablet by mouth daily.   hydroxychloroquine (PLAQUENIL) 200 MG tablet Take 1 tablet (200 mg total) by mouth 2 (two) times daily.   metoprolol succinate (TOPROL-XL) 25 MG 24 hr tablet Take 1 tablet (25 mg total) by mouth daily. Please make overdue appt with Dr. Acie Fredrickson before anymore refills. Thank you 2nd attempt   Multiple Vitamin (MULTIVITAMIN) tablet Take 1 tablet by mouth daily.   nitrofurantoin, macrocrystal-monohydrate, (MACROBID) 100 MG capsule 2 (two) times daily.   omeprazole (PRILOSEC) 20 MG capsule  omeprazole 20 mg capsule,delayed release   rivaroxaban (XARELTO) 20 MG TABS tablet Take 20 mg by mouth daily.    tirzepatide [facility]) 2.5 MG/0.5ML Pen Inject 2.5 mg into the skin once a week.   tolterodine (DETROL LA) 2 MG 24 hr capsule Take 2 mg by mouth daily.   vitamin B-12 (CYANOCOBALAMIN) 1000 MCG tablet Take 1,000 mcg by mouth as needed.     Allergies:   Contrast media [iodinated diagnostic agents], Penicillins, and Ancef [cefazolin sodium]   Social History   Tobacco Use   Smoking status: Never   Smokeless tobacco: Never  Vaping Use   Vaping Use: Never used  Substance Use Topics   Alcohol use: No    Alcohol/week: 0.0 standard drinks   Drug use: No     Family Hx: The patient's family history includes Arthritis in her mother; Diabetes in her mother; Heart attack in her father; Heart disease in her father; Kidney disease in her father; Peripheral vascular disease in her father.  ROS:   Please see the history of present illness.     All other systems reviewed and are negative.   Prior CV studies:   The following studies were reviewed today:    Labs/Other Tests and Data Reviewed:    EKG:   Sept. 23, 2022:   NSR at 76 , inc RBBB   Recent Labs: 09/09/2020: ALT 28; BUN 9; Creat 0.65; Hemoglobin 12.4; Platelets 200; Potassium 4.4; Sodium 140   Recent Lipid Panel No results found for: CHOL, TRIG, HDL, CHOLHDL, LDLCALC, LDLDIRECT  Wt Readings from Last 3 Encounters:  10/28/20 (!) 334 lb 9.6 oz (151.8 kg)  10/11/20 (!) 333 lb (151 kg)  09/09/20 (!) 333 lb (151 kg)     Objective:    Physical Exam: Blood pressure 122/88, pulse 76, height 5\' 6"  (1.676 m), weight (!) 334 lb 9.6 oz (151.8 kg), SpO2 95 %.  GEN:  morbidly  obese female,  NAD  HEENT: Normal NECK: No JVD; No carotid bruits LYMPHATICS: No lymphadenopathy CARDIAC: RRR , no murmurs, rubs, gallops RESPIRATORY:  Clear to auscultation without rales, wheezing or rhonchi  ABDOMEN: Soft, non-tender,  non-distended MUSCULOSKELETAL:  No edema; ankles are large No deformity  SKIN: Warm and dry NEUROLOGIC:  Alert and oriented x 3     ASSESSMENT & PLAN:     1.  PAF:   no recent episodes of PAF .   Cont metoprolol and xarelto  2.  Hx of saddle pulmonary embolus:   is on lifelong Xarelto.    3.  Palpitations:  has brief episodes of SVT  4.  Pre op for R TKA: [name] is at low risk for her upcoming knee replacement.  She will need to hold the Xarelto prior to her knee replacement.  I will defer to her Pharm.D.'s for timing and duration.  In the past she has used Lovenox for bridging.       Medication Adjustments/Labs and Tests Ordered: Current medicines are reviewed at length with the patient today.  Concerns regarding medicines are outlined above.   Tests Ordered: Orders Placed This Encounter  Procedures   EKG 12-Lead     Medication Changes: No orders of the defined types were placed in this encounter.   Disposition:  Follow up in 1 year   Signed, Mertie Moores, MD  10/28/2020 8:50 AM    [facility]

## 2020-10-28 ENCOUNTER — Encounter: Payer: Self-pay | Admitting: Cardiovascular Disease

## 2020-10-28 ENCOUNTER — Ambulatory Visit (INDEPENDENT_AMBULATORY_CARE_PROVIDER_SITE_OTHER): Payer: No Typology Code available for payment source | Admitting: Cardiovascular Disease

## 2020-10-28 ENCOUNTER — Other Ambulatory Visit: Payer: Self-pay

## 2020-10-28 VITALS — BP 122/88 | HR 76 | Ht 66.0 in | Wt 334.6 lb

## 2020-10-28 DIAGNOSIS — Z7901 Long term (current) use of anticoagulants: Secondary | ICD-10-CM | POA: Diagnosis not present

## 2020-10-28 DIAGNOSIS — I2602 Saddle embolus of pulmonary artery with acute cor pulmonale: Secondary | ICD-10-CM | POA: Diagnosis not present

## 2020-10-28 DIAGNOSIS — I2692 Saddle embolus of pulmonary artery without acute cor pulmonale: Secondary | ICD-10-CM

## 2020-10-28 DIAGNOSIS — I2782 Chronic pulmonary embolism: Secondary | ICD-10-CM

## 2020-10-28 DIAGNOSIS — I480 Paroxysmal atrial fibrillation: Secondary | ICD-10-CM

## 2020-10-28 MED ORDER — METOPROLOL SUCCINATE ER 25 MG PO TB24: 25.0000 mg | ORAL_TABLET | Freq: Every day | ORAL | 3 refills | Status: DC

## 2020-10-28 NOTE — Patient Instructions (Signed)

## 2020-11-17 ENCOUNTER — Telehealth: Payer: Self-pay | Admitting: Orthopaedic Surgery

## 2020-11-17 NOTE — Telephone Encounter (Signed)
Forms, payment & auth received at front desk. To [employer].

## 2020-11-22 ENCOUNTER — Other Ambulatory Visit: Payer: Self-pay

## 2020-12-05 ENCOUNTER — Other Ambulatory Visit: Payer: Self-pay | Admitting: Rheumatology

## 2020-12-05 NOTE — Telephone Encounter (Signed)
Next Visit: 02/09/2021  Last Visit: 09/09/2020  Labs: 09/09/2020 WBC count is mildly decreased , MCV is low most likely due to iron deficiency , CMP is normal.  Eye exam: 08/15/2020 Normal.   Current Dose per office note 09/09/2020: Plaquenil 200 mg 1 tablet by mouth twice daily Monday to Friday  VS:[id number] arthritis with rheumatoid factor of multiple sites without organ or systems involvement   Last Fill: 09/09/2020  Okay to refill Plaquenil?

## 2020-12-09 NOTE — Progress Notes (Signed)
Please enter orders for PAT visit scheduled for 12-16-20

## 2020-12-13 ENCOUNTER — Other Ambulatory Visit: Payer: Self-pay | Admitting: Physician Assistant

## 2020-12-13 DIAGNOSIS — M1711 Unilateral primary osteoarthritis, right knee: Secondary | ICD-10-CM

## 2020-12-15 NOTE — Patient Instructions (Addendum)
DUE TO COVID-19 ONLY ONE VISITOR IS ALLOWED TO COME WITH YOU AND STAY IN THE WAITING ROOM ONLY DURING PRE OP AND PROCEDURE.   **NO VISITORS ARE ALLOWED IN THE SHORT STAY AREA OR RECOVERY ROOM!!**  IF YOU WILL BE ADMITTED INTO THE HOSPITAL YOU ARE ALLOWED ONLY TWO SUPPORT PEOPLE DURING VISITATION HOURS ONLY (7AM -8PM)   The support person(s) may change daily. The support person(s) must pass our screening, gel in and out, and wear a mask at all times, including in the patient's room. Patients must also wear a mask when staff or their support person are in the room.  No visitors under the age of 63. Any visitor under the age of 67 must be accompanied by an adult.        Your procedure is scheduled on: 12/23/20   Report to [facility] Main Entrance    Report to admitting at 9:30 AM   Call this number if you have problems the morning of surgery [phone]   Do not eat food :After Midnight.   May have liquids until 9:15 AM day of surgery  CLEAR LIQUID DIET  Foods Allowed                                                                     Foods Excluded  Water, Black Coffee and tea (no milk or creamer)            liquids that you cannot  Plain Jell-O in any flavor  (No red)                                    see through such as: Fruit ices (not with fruit pulp)                                            milk, soups, orange juice              Iced Popsicles (No red)                                                All solid food                                   Apple juices Sports drinks like Gatorade (No red) Lightly seasoned clear broth or consume(fat free) Sugar     The day of surgery:  Drink ONE (1) Pre-Surgery G2 by 9:15 am the morning of surgery. Drink in one sitting. Do not sip.  This drink was given to you during your hospital  pre-op appointment visit. Nothing else to drink after completing the  Pre-Surgery G2.          If you have questions, please contact your  surgeon's office.     Oral Hygiene is also important to reduce your risk of infection.  Remember - BRUSH YOUR TEETH THE MORNING OF SURGERY WITH YOUR REGULAR TOOTHPASTE   Take these medicines the morning of surgery with A SIP OF WATER: Metoprolol, Prilosec, Tolterodine.   DO NOT TAKE ANY ORAL DIABETIC MEDICATIONS DAY OF YOUR SURGERY  How to Manage Your Diabetes Before and After Surgery  Why is it important to control my blood sugar before and after surgery? Improving blood sugar levels before and after surgery helps healing and can limit problems. A way of improving blood sugar control is eating a healthy diet by:  Eating less sugar and carbohydrates  Increasing activity/exercise  Talking with your doctor about reaching your blood sugar goals High blood sugars (greater than 180 mg/dL) can raise your risk of infections and slow your recovery, so you will need to focus on controlling your diabetes during the weeks before surgery. Make sure that the doctor who takes care of your diabetes knows about your planned surgery including the date and location.  How do I manage my blood sugar before surgery? Check your blood sugar at least 4 times a day, starting 2 days before surgery, to make sure that the level is not too high or low. Check your blood sugar the morning of your surgery when you wake up and every 2 hours until you get to the Short Stay unit. If your blood sugar is less than 70 mg/dL, you will need to treat for low blood sugar: Do not take insulin. Treat a low blood sugar (less than 70 mg/dL) with  cup of clear juice (cranberry or apple), 4 glucose tablets, OR glucose gel. Recheck blood sugar in 15 minutes after treatment (to make sure it is greater than 70 mg/dL). If your blood sugar is not greater than 70 mg/dL on recheck, call [phone] for further instructions. Report your blood sugar to the short stay nurse when you get to Short Stay.  If  you are admitted to the hospital after surgery: Your blood sugar will be checked by the staff and you will probably be given insulin after surgery (instead of oral diabetes medicines) to make sure you have good blood sugar levels. The goal for blood sugar control after surgery is 80-180 mg/dL.   Reviewed and Endorsed by [facility] Patient Education Committee, August 2015                               You may not have any metal on your body including hair pins, jewelry, and body piercing             Do not wear make-up, lotions, powders, perfumes, or deodorant  Do not wear nail polish including gel and S&S, artificial/acrylic nails, or any other type of covering on natural nails including finger and toenails. If you have artificial nails, gel coating, etc. that needs to be removed by a nail salon please have this removed prior to surgery or surgery may need to be canceled/ delayed if the surgeon/ anesthesia feels like they are unable to be safely monitored.   Do not shave  48 hours prior to surgery.    Do not bring valuables to the hospital. [place].   Bring small overnight bag day of surgery.   Please read over the following fact sheets you were given: IF YOU HAVE QUESTIONS ABOUT YOUR PRE-OP INSTRUCTIONS PLEASE  CALL [facility] - Preparing for Surgery Before surgery, you can play an important role.  Because skin is not sterile, your skin needs to be as free of germs as possible.  You can reduce the number of germs on your skin by washing with CHG (chlorahexidine gluconate) soap before surgery.  CHG is an antiseptic cleaner which kills germs and bonds with the skin to continue killing germs even after washing. Please DO NOT use if you have an allergy to CHG or antibacterial soaps.  If your skin becomes reddened/irritated stop using the CHG and inform your nurse when you arrive at Short Stay. Do not shave (including legs  and underarms) for at least 48 hours prior to the first CHG shower.  You may shave your face/neck.  Please follow these instructions carefully:  1.  Shower with CHG Soap the night before surgery and the  morning of surgery.  2.  If you choose to wash your hair, wash your hair first as usual with your normal  shampoo.  3.  After you shampoo, rinse your hair and body thoroughly to remove the shampoo.                             4.  Use CHG as you would any other liquid soap.  You can apply chg directly to the skin and wash.  Gently with a scrungie or clean washcloth.  5.  Apply the CHG Soap to your body ONLY FROM THE NECK DOWN.   Do   not use on face/ open                           Wound or open sores. Avoid contact with eyes, ears mouth and   genitals (private parts).                       Wash face,  Genitals (private parts) with your normal soap.             6.  Wash thoroughly, paying special attention to the area where your    surgery  will be performed.  7.  Thoroughly rinse your body with warm water from the neck down.  8.  DO NOT shower/wash with your normal soap after using and rinsing off the CHG Soap.                9.  Pat yourself dry with a clean towel.            10.  Wear clean pajamas.            11.  Place clean sheets on your bed the night of your first shower and do not  sleep with pets. Day of Surgery : Do not apply any lotions/deodorants the morning of surgery.  Please wear clean clothes to the hospital/surgery center.  FAILURE TO FOLLOW THESE INSTRUCTIONS MAY RESULT IN THE CANCELLATION OF YOUR SURGERY  PATIENT SIGNATURE_________________________________  NURSE SIGNATURE__________________________________  ________________________________________________________________________   [name]  An incentive spirometer is a tool that can help keep your lungs clear and active. This tool measures how well you are filling your lungs with each breath. Taking long deep  breaths may help reverse or decrease the chance of developing breathing (pulmonary) problems (especially infection) following: A long period of time when you are unable to move or be active.  BEFORE THE PROCEDURE  If the spirometer includes an indicator to show your best effort, your nurse or respiratory therapist will set it to a desired goal. If possible, sit up straight or lean slightly forward. Try not to slouch. Hold the incentive spirometer in an upright position. INSTRUCTIONS FOR USE  Sit on the edge of your bed if possible, or sit up as far as you can in bed or on a chair. Hold the incentive spirometer in an upright position. Breathe out normally. Place the mouthpiece in your mouth and seal your lips tightly around it. Breathe in slowly and as deeply as possible, raising the piston or the ball toward the top of the column. Hold your breath for 3-5 seconds or for as long as possible. Allow the piston or ball to fall to the bottom of the column. Remove the mouthpiece from your mouth and breathe out normally. Rest for a few seconds and repeat Steps 1 through 7 at least 10 times every 1-2 hours when you are awake. Take your time and take a few normal breaths between deep breaths. The spirometer may include an indicator to show your best effort. Use the indicator as a goal to work toward during each repetition. After each set of 10 deep breaths, practice coughing to be sure your lungs are clear. If you have an incision (the cut made at the time of surgery), support your incision when coughing by placing a pillow or rolled up towels firmly against it. Once you are able to get out of bed, walk around indoors and cough well. You may stop using the incentive spirometer when instructed by your caregiver.  RISKS AND COMPLICATIONS Take your time so you do not get dizzy or light-headed. If you are in pain, you may need to take or ask for pain medication before doing incentive spirometry. It is harder  to take a deep breath if you are having pain. AFTER USE Rest and breathe slowly and easily. It can be helpful to keep track of a log of your progress. Your caregiver can provide you with a simple table to help with this. If you are using the spirometer at home, follow these instructions: [place] IF:  You are having difficultly using the spirometer. You have trouble using the spirometer as often as instructed. Your pain medication is not giving enough relief while using the spirometer. You develop fever of 100.5 F (38.1 C) or higher. SEEK IMMEDIATE MEDICAL CARE IF:  You cough up bloody sputum that had not been present before. You develop fever of 102 F (38.9 C) or greater. You develop worsening pain at or near the incision site. MAKE SURE YOU:  Understand these instructions. Will watch your condition. Will get help right away if you are not doing well or get worse. Document Released: 06/04/2006 Document Revised: 04/16/2011 Document Reviewed: 08/05/2006 ExitCare Patient Information 2014 ExitCare, [place].   ________________________________________________________________________  WHAT IS A BLOOD TRANSFUSION? Blood Transfusion Information  A transfusion is the replacement of blood or some of its parts. Blood is made up of multiple cells which provide different functions. Red blood cells carry oxygen and are used for blood loss replacement. White blood cells fight against infection. Platelets control bleeding. Plasma helps clot blood. Other blood products are available for specialized needs, such as hemophilia or other clotting disorders. BEFORE THE TRANSFUSION  Who gives blood for transfusions?  Healthy volunteers who are fully evaluated to make sure their blood is safe. This is blood bank  blood. Transfusion therapy is the safest it has ever been in the practice of medicine. Before blood is taken from a donor, a complete history is taken to make sure that person has no  history of diseases nor engages in risky social behavior (examples are intravenous drug use or sexual activity with multiple partners). The donor's travel history is screened to minimize risk of transmitting infections, such as malaria. The donated blood is tested for signs of infectious diseases, such as HIV and hepatitis. The blood is then tested to be sure it is compatible with you in order to minimize the chance of a transfusion reaction. If you or a relative donates blood, this is often done in anticipation of surgery and is not appropriate for emergency situations. It takes many days to process the donated blood. RISKS AND COMPLICATIONS Although transfusion therapy is very safe and saves many lives, the main dangers of transfusion include:  Getting an infectious disease. Developing a transfusion reaction. This is an allergic reaction to something in the blood you were given. Every precaution is taken to prevent this. The decision to have a blood transfusion has been considered carefully by your caregiver before blood is given. Blood is not given unless the benefits outweigh the risks. AFTER THE TRANSFUSION Right after receiving a blood transfusion, you will usually feel much better and more energetic. This is especially true if your red blood cells have gotten low (anemic). The transfusion raises the level of the red blood cells which carry oxygen, and this usually causes an energy increase. The nurse administering the transfusion will monitor you carefully for complications. HOME CARE INSTRUCTIONS  No special instructions are needed after a transfusion. You may find your energy is better. Speak with your caregiver about any limitations on activity for underlying diseases you may have. SEEK MEDICAL CARE IF:  Your condition is not improving after your transfusion. You develop redness or irritation at the intravenous (IV) site. SEEK IMMEDIATE MEDICAL CARE IF:  Any of the following symptoms occur  over the next 12 hours: Shaking chills. You have a temperature by mouth above 102 F (38.9 C), not controlled by medicine. Chest, back, or muscle pain. People around you feel you are not acting correctly or are confused. Shortness of breath or difficulty breathing. Dizziness and fainting. You get a rash or develop hives. You have a decrease in urine output. Your urine turns a dark color or changes to pink, red, or brown. Any of the following symptoms occur over the next 10 days: You have a temperature by mouth above 102 F (38.9 C), not controlled by medicine. Shortness of breath. Weakness after normal activity. The white part of the eye turns yellow (jaundice). You have a decrease in the amount of urine or are urinating less often. Your urine turns a dark color or changes to pink, red, or brown. Document Released: 01/20/2000 Document Revised: 04/16/2011 Document Reviewed: 09/08/2007 [facility] Patient Information 2014 [place], [place].  _______________________________________________________________________

## 2020-12-15 NOTE — Progress Notes (Addendum)
COVID swab appointment: n/a  COVID Vaccine Completed: yes x2 Date COVID Vaccine completed: 09/07/19, 10/09/19 Has received booster: COVID vaccine manufacturer: [employer]   Date of COVID positive in last 90 days: yes 12/05/20  PCP - Bertram Millard, MD Cardiologist - Cleatrice Burke, MD  Cardiac clearance 10/28/20 by Dr. Cathie Olden in Epic  Chest x-ray - n/a EKG - 10/28/20 Epic Stress Test - n/a ECHO - 07/21/18 Epic Cardiac Cath - n/a Pacemaker/ICD device last checked: n/a Spinal Cord Stimulator: n/a  Sleep Study - yes, negative for sleep apnea CPAP -   Fasting Blood Sugar - pre DM, pt states she does not check at home Checks Blood Sugar _____ times a day  Blood Thinner Instructions: Xarelto, no instructions yet. Instructed to call cardiologist office and ask today Aspirin Instructions: Last Dose:  Activity level: Can go up a flight of stairs and perform activities of daily living without stopping and without symptoms of chest pain or shortness of breath. Difficulty with stairs due to knee    Anesthesia review: Awareness under anesthesia, PE, PVT, a fib, DM 2, dyspnea, CHF  Patient denies shortness of breath, fever, cough and chest pain at PAT appointment   Patient verbalized understanding of instructions that were given to them at the PAT appointment. Patient was also instructed that they will need to review over the PAT instructions again at home before surgery.

## 2020-12-16 ENCOUNTER — Other Ambulatory Visit: Payer: Self-pay

## 2020-12-16 ENCOUNTER — Telehealth: Payer: Self-pay | Admitting: Cardiovascular Disease

## 2020-12-16 ENCOUNTER — Encounter (HOSPITAL_COMMUNITY): Payer: Self-pay

## 2020-12-16 ENCOUNTER — Encounter: Payer: Self-pay | Admitting: Orthopaedic Surgery

## 2020-12-16 ENCOUNTER — Encounter (HOSPITAL_COMMUNITY)
Admission: RE | Admit: 2020-12-16 | Discharge: 2020-12-16 | Disposition: A | Discharge status: Home / Self Care | Payer: No Typology Code available for payment source | Source: Ambulatory Visit | Attending: Orthopaedic Surgery | Admitting: Orthopaedic Surgery

## 2020-12-16 VITALS — BP 137/76 | HR 62 | Temp 98.3°F | Resp 12 | Ht 66.0 in | Wt 314.4 lb

## 2020-12-16 DIAGNOSIS — M1711 Unilateral primary osteoarthritis, right knee: Secondary | ICD-10-CM | POA: Diagnosis not present

## 2020-12-16 DIAGNOSIS — I480 Paroxysmal atrial fibrillation: Secondary | ICD-10-CM | POA: Diagnosis not present

## 2020-12-16 DIAGNOSIS — Z01818 Encounter for other preprocedural examination: Secondary | ICD-10-CM

## 2020-12-16 DIAGNOSIS — Z01812 Encounter for preprocedural laboratory examination: Secondary | ICD-10-CM | POA: Diagnosis present

## 2020-12-16 DIAGNOSIS — E1121 Type 2 diabetes mellitus with diabetic nephropathy: Secondary | ICD-10-CM | POA: Diagnosis not present

## 2020-12-16 HISTORY — DX: Prediabetes: R73.03

## 2020-12-16 HISTORY — DX: Other complications of anesthesia, initial encounter: T88.59XA

## 2020-12-16 LAB — BASIC METABOLIC PANEL
Anion gap: 9 (ref 5–15)
BUN: 9 mg/dL (ref 8–23)
CO2: 27 mmol/L (ref 22–32)
Calcium: 10.1 mg/dL (ref 8.9–10.3)
Chloride: 104 mmol/L (ref 98–111)
Creatinine, Ser: 0.66 mg/dL (ref 0.44–1.00)
GFR, Estimated: >60 mL/min (ref >60)
Glucose, Bld: 83 mg/dL (ref 70–99)
Potassium: 4.3 mmol/L (ref 3.5–5.1)
Sodium: 140 mmol/L (ref 135–145)

## 2020-12-16 LAB — CBC
HCT: 41.3 % (ref 36.0–46.0)
Hemoglobin: 12.7 g/dL (ref 12.0–15.0)
MCH: 24.5 pg — ABNORMAL LOW (ref 26.0–34.0)
MCHC: 30.8 g/dL (ref 30.0–36.0)
MCV: 79.6 fL — ABNORMAL LOW (ref 80.0–100.0)
Platelets: 204 10*3/uL (ref 150–400)
RBC: 5.19 MIL/uL — ABNORMAL HIGH (ref 3.87–5.11)
RDW: 16.4 % — ABNORMAL HIGH (ref 11.5–15.5)
WBC: 3.6 10*3/uL — ABNORMAL LOW (ref 4.0–10.5)
nRBC: 0 % (ref 0.0–0.2)

## 2020-12-16 LAB — SURGICAL PCR SCREEN
MRSA, PCR: NEGATIVE
Staphylococcus aureus: NEGATIVE

## 2020-12-16 LAB — HEMOGLOBIN A1C
Hgb A1c MFr Bld: 5.2 % (ref 4.8–5.6)
Mean Plasma Glucose: 102.54 mg/dL

## 2020-12-16 LAB — GLUCOSE, CAPILLARY: Glucose-Capillary: 83 mg/dL (ref 70–99)

## 2020-12-16 NOTE — Telephone Encounter (Signed)
A phone note was forwarded to me by one of our Triage nurses. I called the requesting office as to the fact that I have not received any clearance request for this pt. If they will fax over a clearance request to fax# [phone]. I will be happy to get the information to the pre op cardiologist. I will wait for clearance request to come over before I can proceed.

## 2020-12-16 NOTE — Telephone Encounter (Signed)
Office calling back to give date of patient surgery 11/18. And like to stop the medication 4 days prior

## 2020-12-19 MED ORDER — ENOXAPARIN SODIUM 150 MG/ML IJ SOSY: 150.0000 mg | PREFILLED_SYRINGE | Freq: Two times a day (BID) | INTRAMUSCULAR | 0 refills | Status: DC

## 2020-12-19 NOTE — Telephone Encounter (Signed)
Pt calling again stating the facility has not received information yet as of this morning. Please see previous message. The best call back number is [phone].

## 2020-12-19 NOTE — Telephone Encounter (Addendum)
Patient with diagnosis of afib and saddle PE and DVT on Xarelto for anticoagulation.    Procedure: RIGHT TOTAL KNEE ARTHROPLASTY Date of procedure: 12/23/20  CrCl 107 ml/min Platelet count 204  Per office protocol, patient can hold Xarelto for 4 days prior to procedure.    I do not see documentation that patient has been bridged in the past, however I did discus it with Dr. Acie Fredrickson who feels due to her weight and inactivity she is at high risk of clot and would like [place] to bridge.  11/13: Last dose of Xarelto in PM  11/14: No xarelto. Inject 150mg  of lovenox into belly at 10 PM  11/15: No xarelto. Inject 150mg  of lovenox into belly at 10 AM and 10 PM  11/16:No xarelto. Inject 150mg  of lovenox into belly at 10 AM and 10 PM  11/17: No xarelto. Inject 150mg  of lovenox into belly at 10 AM No PM dose  11/18: procedure day, No Xarelto, no lovenox  Resume Xarelto as soon as deemed safe by MD. Ideally within 24 of surgery.  Instructions reviewed with patient and rx sent to pharmacy that had it in stock

## 2020-12-19 NOTE — Telephone Encounter (Signed)
   Patient Name: [name]  DOB: Aug 31, 1960 MRN: [id number]  Primary Cardiologist: Mertie Moores, MD  Chart reviewed as part of pre-operative protocol coverage. To summarize recommendations:  - Clearance for procedure was referenced in 10/28/2020 note with Dr. Acie Fredrickson - since within 2 month window, can utilize this clearance here - he stated "Going for R knee replacement soon She is at low risk for this surgery. OK to hold Xarelto - will defer to Pharm D for  timing / duration . She has used Lovenox bridging in the past if the decision is made to bridge her ."   - Regarding anticoagulation, our pharmacist has recommended the following:  Per office protocol, patient can hold Xarelto for 4 days prior to procedure.     I do not see documentation that patient has been bridged in the past, however I did discus it with Dr. Acie Fredrickson who feels due to her weight and inactivity she is at high risk of clot and would like [place] to bridge.   11/13: Last dose of Xarelto in PM   11/14: No xarelto. Inject 150mg  of lovenox into belly at 10 PM   11/15: No xarelto. Inject 150mg  of lovenox into belly at 10 AM and 10 PM   11/16:No xarelto. Inject 150mg  of lovenox into belly at 10 AM and 10 PM   11/17: No xarelto. Inject 150mg  of lovenox into belly at 10 AM No PM dose   11/18: procedure day, No Xarelto, no lovenox   Resume Xarelto as soon as deemed safe by MD. Ideally within 24 of surgery.   Instructions reviewed with patient and rx sent to pharmacy that had it in stock    Will route this bundled recommendation to requesting provider via Epic fax function and remove from pre-op pool. Please call with questions.  Charlie Pitter, PA-C 12/19/2020, 4:19 PM

## 2020-12-19 NOTE — Telephone Encounter (Signed)
   Patient Name: [name]  DOB: 1960/09/10 MRN: [id number]  Primary Cardiologist: Mertie Moores, MD  Chart reviewed as part of pre-operative protocol coverage. Patient was last seen 10/28/20 by Dr. Acie Fredrickson. Surgery date is listed here as 12/06/20 in the clearance but scheduled 12/23/20 per EMR. Clearance referenced in 10/2020 note with Dr. Acie Fredrickson - since within 2 month window, can utilize this clearance here - he stated "Going for R knee replacement soon She is at low risk for this surgery. OK to hold Xarelto - will defer to Pharm D for  timing / duration . She has used Lovenox bridging in the past if the decision is made to bridge her ."   Will route to pharm team for input on anticoagulation. Await pharm input then will bundle.   Charlie Pitter, PA-C 12/19/2020, 12:29 PM

## 2020-12-19 NOTE — Telephone Encounter (Signed)
   Pre-operative Risk Assessment    Patient Name: [name]  DOB: [birth year] MRN: [id number]      Request for Surgical Clearance   Procedure:   RIGHT TOTAL KNEE ARTHROPLASTY  Date of Surgery: Clearance 12/16/20  URGENT                              Surgeon:  DR. Jean Rosenthal Surgeon's Group or Practice Name:  [facility] Phone number:  [phone] Fax number:  [phone]   Type of Clearance Requested: - Medical  - Pharmacy:  Hold Rivaroxaban (Xarelto)  [place]    Type of Anesthesia:   Spinal   Additional requests/questions:   Jiles Prows   12/19/2020, 11:47 AM

## 2020-12-19 NOTE — Telephone Encounter (Signed)
Please see the note in pt's chart from dr. Geralyn Flash office. They are needing a clearance form faxed over

## 2020-12-22 NOTE — Progress Notes (Signed)
Anesthesia Chart Review   Case: [id number] Date/Time: 12/23/20 1200   Procedure: RIGHT TOTAL KNEE ARTHROPLASTY (Right: Knee)   Anesthesia type: Spinal   Pre-op diagnosis: osteoarthritis right knee   Location: [facility]OM 09 / [facility]   Surgeons: [name], MD       DISCUSSION:60 y.o. never smoker with h/o GERD, CHF, atrial fibrillation (on Xarelto), PE, right knee OA scheduled for above procedure 12/23/2020 with Dr. Jean Rosenthal.   Per cardiology preoperative evaluation 12/19/2020, "Chart reviewed as part of pre-operative protocol coverage. To summarize recommendations:   - Clearance for procedure was referenced in 10/28/2020 note with Dr. Acie Fredrickson - since within 2 month window, can utilize this clearance here - he stated "Going for R knee replacement soon She is at low risk for this surgery. OK to hold Xarelto - will defer to Pharm D for  timing / duration . She has used Lovenox bridging in the past if the decision is made to bridge her ."  Pt advised by pharmacy to hold Xarelto 4 days prior to procedure with Lovenox bridge. Reviewed with patient by pharmacist.   Anticipate pt can proceed with planned procedure barring acute status change.   VS: BP 137/76   Pulse 62   Temp 36.8 C (Oral)   Resp 12   Ht 5\' 6"  (1.676 m)   Wt (!) 142.6 kg   SpO2 96%   BMI 50.75 kg/m   PROVIDERS: Mateo Flow, MD is PCP   Primary Cardiologist: Mertie Moores, MD LABS: Labs reviewed: Acceptable for surgery. (all labs ordered are listed, but only abnormal results are displayed)  Labs Reviewed  CBC - Abnormal; Notable for the following components:      Result Value   WBC 3.6 (*)    RBC 5.19 (*)    MCV 79.6 (*)    MCH 24.5 (*)    RDW 16.4 (*)    All other components within normal limits  SURGICAL PCR SCREEN  HEMOGLOBIN W4O  BASIC METABOLIC PANEL  GLUCOSE, CAPILLARY  TYPE AND SCREEN     IMAGES:   EKG: 10/28/2020 Rate 76 bpm  NSR  CV: Echo 07/21/2018  1. The left  ventricle has normal systolic function with an ejection  fraction of 60-65%. The cavity size was normal. Left ventricular diastolic  parameters were normal.   2. The right ventricle has normal systolic function. The cavity was  normal. There is no increase in right ventricular wall thickness.   3. Mild thickening of the mitral valve leaflet.   4. The aortic valve is tricuspid. Mild thickening of the aortic valve.  Mild calcification of the aortic valve.  Past Medical History:  Diagnosis Date   Anemia    Arthritis    Atrial fibrillation ([place])    Bleeding ulcer 12-2010  hospitalized for 1 week   Blood transfusion    Blood transfusion without reported diagnosis    2011-2 units transfused, bleeding ulcer   Bruises easily    CHF (congestive heart failure) ([place])    with PE resolved after    Complication of anesthesia    Constipation    Difficulty in swallowing 03/24/2013   no longer a problem   Diverticulitis    DVT (deep venous thrombosis) ([place]) 06/2013   right leg   Dysrhythmia     Hx of A fib    GERD (gastroesophageal reflux disease)    Hyperlipidemia    Leg swelling 03/24/2013   not a problem any longer  Osteoarthritis    Peripheral vascular disease ([place])    Pre-diabetes    Pulmonary embolus ([place]) 06/2013   Reflux    Shortness of breath    with PE   Vomiting 03/24/2013   problem resolved after lap band revision    Past Surgical History:  Procedure Laterality Date   APPENDECTOMY     [place]     CHOLECYSTECTOMY     2'12   COLONOSCOPY N/A 04/10/2013   Procedure: COLONOSCOPY;  Surgeon: Beryle Beams, MD;  [facility];  Service: Endoscopy;  Laterality: N/A;   DENTAL SURGERY  10/2019   tooth implant   DIAGNOSTIC LAPAROSCOPY     ENDOVENOUS ABLATION SAPHENOUS VEIN W/ LASER  06/2008   ENDOVENOUS ABLATION SAPHENOUS VEIN W/ LASER Left 12/09/2013   EVLA LEFT GREATER SAPHENOUS VEIN BY TODD EARLY MD   ESOPHAGOGASTRODUODENOSCOPY  12/18/2010    Procedure: ESOPHAGOGASTRODUODENOSCOPY (EGD);  Surgeon: Gatha Mayer, MD;  Location: [facility];  Service: Endoscopy;  Laterality: N/A;   JOINT REPLACEMENT Left    knee   LAPAROSCOPIC GASTRIC BANDING  01/05/2008   LAPAROSCOPIC GASTRIC BANDING  12/18/2010   01/04/2009   SALPHINGOOOPHORECTOMY Right    cyst removal   TONSILLECTOMY     TOTAL KNEE ARTHROPLASTY Left 07/31/2016   Procedure: LEFT TOTAL KNEE ARTHROPLASTY;  Surgeon: [name], MD;  Location: [facility];  Service: Orthopedics;  Laterality: Left;   WEDGE RESECTION OF OVARY  LEFT OVARY     MEDICATIONS:  enoxaparin (LOVENOX) 150 MG/ML injection   hydroxychloroquine (PLAQUENIL) 200 MG tablet   metoprolol succinate (TOPROL-XL) 25 MG 24 hr tablet   Multiple Vitamin (MULTIVITAMIN) tablet   nitrofurantoin, macrocrystal-monohydrate, (MACROBID) 100 MG capsule   omeprazole (PRILOSEC) 40 MG capsule   rivaroxaban (XARELTO) 20 MG TABS tablet   tirzepatide (MOUNJARO) 2.5 MG/0.5ML Pen   tolterodine (DETROL LA) 2 MG 24 hr capsule   No current facility-administered medications for this encounter.    Konrad Felix Ward, PA-C [facility] [phone]

## 2020-12-22 NOTE — H&P (Signed)
TOTAL KNEE ADMISSION H&P  Patient is being admitted for right total knee arthroplasty.  Subjective:  Chief Complaint:right knee pain.  HPI: [name], 60 y.o. female, has a history of pain and functional disability in the right knee due to arthritis and has failed non-surgical conservative treatments for greater than 12 weeks to includeNSAID's and/or analgesics, corticosteriod injections, viscosupplementation injections, flexibility and strengthening excercises, weight reduction as appropriate, and activity modification.  Onset of symptoms was gradual, starting 5 years ago with gradually worsening course since that time. The patient noted no past surgery on the right knee(s).  Patient currently rates pain in the right knee(s) at 10 out of 10 with activity. Patient has night pain, worsening of pain with activity and weight bearing, pain that interferes with activities of daily living, pain with passive range of motion, crepitus, and joint swelling.  Patient has evidence of subchondral sclerosis, periarticular osteophytes, and joint space narrowing by imaging studies. There is no active infection.  Patient Active Problem List   Diagnosis Date Noted   Unilateral primary osteoarthritis, right knee 04/07/2020   Leg edema 08/19/2018   Presence of left artificial knee joint 08/14/2016   Unilateral primary osteoarthritis, left knee 07/31/2016   Status post total knee replacement, left 07/31/2016   Abdominal hematoma    Post-operative state    Paroxysmal atrial fibrillation ([place])    Controlled type 2 diabetes mellitus with diabetic nephropathy ([place])    Wound infection 01/25/2016   Hydronephrosis, left 01/25/2016   Hydronephrosis, right 01/25/2016   DVT (deep venous thrombosis) ([place]) 01/25/2016   Atrial fibrillation ([place]) 01/25/2016   Diabetes mellitus with complication ([place])    Wound infection after surgery, initial encounter    Varicose veins of lower extremities with complications  57/32/2025   Pain and swelling of left lower leg 10/27/2013   Pain of left lower extremity 10/27/2013   Gastroenteritis, acute 09/08/2013   Fever, unspecified 09/04/2013   ASD (atrial septal defect) 08/13/2013   Pulmonary embolism ([place]) 06/22/2013   Dyspnea 06/11/2013   Anterior slip repaired Oct 2012 01/18/2011   GI bleed 12/18/2010   Diabetes mellitus ([place]) 12/18/2010   Varicose veins of lower extremities with other complications 42/70/6237   Diabetes mellitus    HIP PAIN, RIGHT 03/06/2010   Past Medical History:  Diagnosis Date   Anemia    Arthritis    Atrial fibrillation ([place])    Bleeding ulcer 12-2010  hospitalized for 1 week   Blood transfusion    Blood transfusion without reported diagnosis    2011-2 units transfused, bleeding ulcer   Bruises easily    CHF (congestive heart failure) ([place])    with PE resolved after    Complication of anesthesia    Constipation    Difficulty in swallowing 03/24/2013   no longer a problem   Diverticulitis    DVT (deep venous thrombosis) ([place]) 06/2013   right leg   Dysrhythmia     Hx of A fib    GERD (gastroesophageal reflux disease)    Hyperlipidemia    Leg swelling 03/24/2013   not a problem any longer   Osteoarthritis    Peripheral vascular disease ([facility])    Pre-diabetes    Pulmonary embolus ([place]) 06/2013   Reflux    Shortness of breath    with PE   Vomiting 03/24/2013   problem resolved after lap band revision    Past Surgical History:  Procedure Laterality Date   APPENDECTOMY     CARPEL TUNNEL  BILATEREAL     CHOLECYSTECTOMY     5'12   COLONOSCOPY N/A 04/10/2013   Procedure: COLONOSCOPY;  Surgeon: Beryle Beams, MD;  [facility];  Service: Endoscopy;  Laterality: N/A;   DENTAL SURGERY  10/2019   tooth implant   DIAGNOSTIC LAPAROSCOPY     ENDOVENOUS ABLATION SAPHENOUS VEIN W/ LASER  06/2008   ENDOVENOUS ABLATION SAPHENOUS VEIN W/ LASER Left 12/09/2013   EVLA LEFT GREATER SAPHENOUS VEIN BY TODD EARLY MD    ESOPHAGOGASTRODUODENOSCOPY  12/18/2010   Procedure: ESOPHAGOGASTRODUODENOSCOPY (EGD);  Surgeon: Gatha Mayer, MD;  Location: [facility];  Service: Endoscopy;  Laterality: N/A;   JOINT REPLACEMENT Left    knee   LAPAROSCOPIC GASTRIC BANDING  01/05/2008   LAPAROSCOPIC GASTRIC BANDING  12/18/2010   01/04/2009   SALPHINGOOOPHORECTOMY Right    cyst removal   TONSILLECTOMY     TOTAL KNEE ARTHROPLASTY Left 07/31/2016   Procedure: LEFT TOTAL KNEE ARTHROPLASTY;  Surgeon: [name], MD;  Location: [facility];  Service: Orthopedics;  Laterality: Left;   WEDGE RESECTION OF OVARY  LEFT OVARY     No current facility-administered medications for this encounter.   Current Outpatient Medications  Medication Sig Dispense Refill Last Dose   hydroxychloroquine (PLAQUENIL) 200 MG tablet TAKE 1 TABLET BY MOUTH TWICE A DAY 180 tablet 0    metoprolol succinate (TOPROL-XL) 25 MG 24 hr tablet Take 1 tablet (25 mg total) by mouth daily. 90 tablet 3    Multiple Vitamin (MULTIVITAMIN) tablet Take 1 tablet by mouth daily.      nitrofurantoin, macrocrystal-monohydrate, (MACROBID) 100 MG capsule Take 100 mg by mouth daily.      omeprazole (PRILOSEC) 40 MG capsule Take 40 mg by mouth daily.      rivaroxaban (XARELTO) 20 MG TABS tablet Take 20 mg by mouth daily.       tirzepatide [facility]) 2.5 MG/0.5ML Pen Inject 7.5 mg into the skin once a week.      tolterodine (DETROL LA) 2 MG 24 hr capsule Take 2 mg by mouth daily.      enoxaparin (LOVENOX) 150 MG/ML injection Inject 1 mL (150 mg total) into the skin every 12 (twelve) hours. 6 mL 0    Allergies  Allergen Reactions   Contrast Media [Iodinated Diagnostic Agents] Shortness Of Breath and Other (See Comments)    APNEA   Penicillins Itching and Rash    PATIENT HAD A PCN REACTION WITH IMMEDIATE RASH, FACIAL/TONGUE/THROAT SWELLING, SOB, OR LIGHTHEADEDNESS WITH HYPOTENSION:  #  #  #  YES  #  #  #   Has patient had a PCN reaction causing severe rash  involving mucus membranes or skin necrosis: NO Has patient had a PCN reaction that required hospitalization NO Has patient had a PCN reaction occurring within the last 10 years: NO   Ancef [Cefazolin Sodium] Nausea And Vomiting    Tolerates IV    Social History   Tobacco Use   Smoking status: Never   Smokeless tobacco: Never  Substance Use Topics   Alcohol use: No    Alcohol/week: 0.0 standard drinks    Family History  Problem Relation Age of Onset   Heart disease Father    Kidney disease Father    Peripheral vascular disease Father    Heart attack Father        33   Diabetes Mother    Arthritis Mother      Review of Systems  Musculoskeletal:  Positive for gait  problem and joint swelling.  All other systems reviewed and are negative.  Objective:  Physical Exam Vitals reviewed.  Constitutional:      Appearance: Normal appearance.  HENT:     Head: Normocephalic and atraumatic.  Eyes:     Extraocular Movements: Extraocular movements intact.     Pupils: Pupils are equal, round, and reactive to light.  Cardiovascular:     Rate and Rhythm: Normal rate and regular rhythm.     Pulses: Normal pulses.  Pulmonary:     Effort: Pulmonary effort is normal.     Breath sounds: Normal breath sounds.  Abdominal:     Palpations: Abdomen is soft.  Musculoskeletal:     Cervical back: Normal range of motion and neck supple.     Right knee: Effusion, bony tenderness and crepitus present. Decreased range of motion. Tenderness present over the medial joint line, lateral joint line and patellar tendon. Abnormal alignment.  Neurological:     Mental Status: She is alert and oriented to person, place, and time.  Psychiatric:        Behavior: Behavior normal.    Vital signs in last 24 hours:    Labs:   Estimated body mass index is 50.75 kg/m as calculated from the following:   Height as of 12/16/20: 5\' 6"  (1.676 m).   Weight as of 12/16/20: 142.6 kg.   Imaging Review Plain  radiographs demonstrate severe degenerative joint disease of the right knee(s). The overall alignment ismild varus. The bone quality appears to be good for age and reported activity level.      Assessment/Plan:  End stage arthritis, right knee   The patient history, physical examination, clinical judgment of the provider and imaging studies are consistent with end stage degenerative joint disease of the right knee(s) and total knee arthroplasty is deemed medically necessary. The treatment options including medical management, injection therapy arthroscopy and arthroplasty were discussed at length. The risks and benefits of total knee arthroplasty were presented and reviewed. The risks due to aseptic loosening, infection, stiffness, patella tracking problems, thromboembolic complications and other imponderables were discussed. The patient acknowledged the explanation, agreed to proceed with the plan and consent was signed. Patient is being admitted for inpatient treatment for surgery, pain control, PT, OT, prophylactic antibiotics, VTE prophylaxis, progressive ambulation and ADL's and discharge planning. The patient is planning to be discharged home with home health services

## 2020-12-22 NOTE — Anesthesia Preprocedure Evaluation (Addendum)
Anesthesia Evaluation  Patient identified by MRN, date of birth, ID band Patient awake    Reviewed: Allergy & Precautions, NPO status , Patient's Chart, lab work & pertinent test results  Airway Mallampati: II  TM Distance: >3 FB Neck ROM: Full    Dental no notable dental hx. (+) Implants, Teeth Intact, Dental Advisory Given   Pulmonary    Pulmonary exam normal breath sounds clear to auscultation       Cardiovascular + Peripheral Vascular Disease, +CHF and + DVT  Normal cardiovascular exam+ dysrhythmias Atrial Fibrillation  Rhythm:Regular Rate:Normal  07/21/18 TTE 1. The left ventricle has normal systolic function with an ejection  fraction of 60-65%. The cavity size was normal. Left ventricular diastolic  parameters were normal.  2. The right ventricle has normal systolic function. The cavity was  normal. There is no increase in right ventricular wall thickness.  3. Mild thickening of the mitral valve leaflet.  4. The aortic valve is tricuspid. Mild thickening of the aortic valve.  Mild calcification of the aortic valve   Neuro/Psych negative neurological ROS  negative psych ROS   GI/Hepatic GERD  ,  Endo/Other  diabetes  Renal/GU Renal diseaseLab Results      Component                Value               Date                      CREATININE               0.66                12/16/2020                BUN                      9                   12/16/2020                NA                       140                 12/16/2020                K                        4.3                 12/16/2020                CL                       104                 12/16/2020                CO2                      27                  12/16/2020                Musculoskeletal  (+) Arthritis ,   Abdominal  Peds  Hematology Lab Results      Component                Value               Date                      WBC                       3.6 (L)             12/16/2020                HGB                      12.7                12/16/2020                HCT                      41.3                12/16/2020                MCV                      79.6 (L)            12/16/2020                PLT                      204                 12/16/2020              Anesthesia Other Findings   Reproductive/Obstetrics                           Anesthesia Physical Anesthesia Plan  ASA: 3  Anesthesia Plan: Spinal   Post-op Pain Management: Regional block   Induction:   PONV Risk Score and Plan: 3 and Treatment may vary due to age or medical condition, Ondansetron and Midazolam  Airway Management Planned: Natural Airway and Nasal Cannula  Additional Equipment: None  Intra-op Plan:   Post-operative Plan:   Informed Consent: I have reviewed the patients History and Physical, chart, labs and discussed the procedure including the risks, benefits and alternatives for the proposed anesthesia with the patient or authorized representative who has indicated his/her understanding and acceptance.     Dental advisory given  Plan Discussed with: CRNA and Anesthesiologist  Anesthesia Plan Comments: (See PAT note 12/16/20, Konrad Felix Ward, PA-C)      Anesthesia Quick Evaluation

## 2020-12-23 ENCOUNTER — Ambulatory Visit (HOSPITAL_COMMUNITY): Payer: No Typology Code available for payment source | Admitting: Physician Assistant

## 2020-12-23 ENCOUNTER — Observation Stay (HOSPITAL_COMMUNITY): Payer: No Typology Code available for payment source

## 2020-12-23 ENCOUNTER — Other Ambulatory Visit: Payer: Self-pay

## 2020-12-23 ENCOUNTER — Encounter (HOSPITAL_COMMUNITY): Payer: Self-pay | Admitting: Orthopaedic Surgery

## 2020-12-23 ENCOUNTER — Inpatient Hospital Stay (HOSPITAL_COMMUNITY)
Admission: AD | Admit: 2020-12-23 | Discharge: 2020-12-31 | DRG: 470 | Disposition: A | Discharge status: Home / Self Care | Payer: No Typology Code available for payment source | Attending: Orthopaedic Surgery | Admitting: Orthopaedic Surgery

## 2020-12-23 ENCOUNTER — Encounter (HOSPITAL_COMMUNITY)
Admission: AD | Disposition: A | Discharge status: Home / Self Care | Payer: Self-pay | Source: Home / Self Care | Attending: Orthopaedic Surgery

## 2020-12-23 ENCOUNTER — Ambulatory Visit (HOSPITAL_COMMUNITY): Payer: No Typology Code available for payment source | Admitting: Certified Registered Nurse Anesthetist

## 2020-12-23 DIAGNOSIS — E1121 Type 2 diabetes mellitus with diabetic nephropathy: Secondary | ICD-10-CM | POA: Diagnosis present

## 2020-12-23 DIAGNOSIS — M25461 Effusion, right knee: Secondary | ICD-10-CM | POA: Diagnosis present

## 2020-12-23 DIAGNOSIS — Z86711 Personal history of pulmonary embolism: Secondary | ICD-10-CM

## 2020-12-23 DIAGNOSIS — Z96652 Presence of left artificial knee joint: Secondary | ICD-10-CM | POA: Diagnosis present

## 2020-12-23 DIAGNOSIS — M21161 Varus deformity, not elsewhere classified, right knee: Secondary | ICD-10-CM | POA: Diagnosis present

## 2020-12-23 DIAGNOSIS — Z96651 Presence of right artificial knee joint: Secondary | ICD-10-CM

## 2020-12-23 DIAGNOSIS — M25761 Osteophyte, right knee: Secondary | ICD-10-CM | POA: Diagnosis present

## 2020-12-23 DIAGNOSIS — K219 Gastro-esophageal reflux disease without esophagitis: Secondary | ICD-10-CM | POA: Diagnosis present

## 2020-12-23 DIAGNOSIS — E785 Hyperlipidemia, unspecified: Secondary | ICD-10-CM | POA: Diagnosis present

## 2020-12-23 DIAGNOSIS — I480 Paroxysmal atrial fibrillation: Secondary | ICD-10-CM

## 2020-12-23 DIAGNOSIS — Z880 Allergy status to penicillin: Secondary | ICD-10-CM

## 2020-12-23 DIAGNOSIS — Z881 Allergy status to other antibiotic agents status: Secondary | ICD-10-CM

## 2020-12-23 DIAGNOSIS — Z91041 Radiographic dye allergy status: Secondary | ICD-10-CM

## 2020-12-23 DIAGNOSIS — R7303 Prediabetes: Secondary | ICD-10-CM | POA: Diagnosis present

## 2020-12-23 DIAGNOSIS — Z86718 Personal history of other venous thrombosis and embolism: Secondary | ICD-10-CM

## 2020-12-23 DIAGNOSIS — Z79899 Other long term (current) drug therapy: Secondary | ICD-10-CM

## 2020-12-23 DIAGNOSIS — Z7901 Long term (current) use of anticoagulants: Secondary | ICD-10-CM

## 2020-12-23 DIAGNOSIS — M21061 Valgus deformity, not elsewhere classified, right knee: Secondary | ICD-10-CM | POA: Diagnosis present

## 2020-12-23 DIAGNOSIS — Z888 Allergy status to other drugs, medicaments and biological substances status: Secondary | ICD-10-CM

## 2020-12-23 DIAGNOSIS — Z6841 Body Mass Index (BMI) 40.0 and over, adult: Secondary | ICD-10-CM

## 2020-12-23 DIAGNOSIS — Z8249 Family history of ischemic heart disease and other diseases of the circulatory system: Secondary | ICD-10-CM

## 2020-12-23 DIAGNOSIS — M1711 Unilateral primary osteoarthritis, right knee: Principal | ICD-10-CM

## 2020-12-23 DIAGNOSIS — Z8261 Family history of arthritis: Secondary | ICD-10-CM

## 2020-12-23 DIAGNOSIS — E1151 Type 2 diabetes mellitus with diabetic peripheral angiopathy without gangrene: Secondary | ICD-10-CM | POA: Diagnosis present

## 2020-12-23 DIAGNOSIS — Z9884 Bariatric surgery status: Secondary | ICD-10-CM

## 2020-12-23 DIAGNOSIS — Z833 Family history of diabetes mellitus: Secondary | ICD-10-CM

## 2020-12-23 HISTORY — PX: TOTAL KNEE ARTHROPLASTY: SHX125

## 2020-12-23 LAB — TYPE AND SCREEN
ABO/RH(D): O POS
Antibody Screen: NEGATIVE

## 2020-12-23 LAB — GLUCOSE, CAPILLARY: Glucose-Capillary: 85 mg/dL (ref 70–99)

## 2020-12-23 SURGERY — ARTHROPLASTY, KNEE, TOTAL
Anesthesia: Spinal | Site: Knee | Laterality: Right

## 2020-12-23 MED ORDER — PANTOPRAZOLE SODIUM 40 MG PO TBEC
40.0000 mg | DELAYED_RELEASE_TABLET | Freq: Every day | ORAL | Status: DC
  Administered 2020-12-23 – 2020-12-31 (×9): 40 mg via ORAL
  Filled 2020-12-23 (×9): qty 1

## 2020-12-23 MED ORDER — BUPIVACAINE-EPINEPHRINE (PF) 0.25% -1:200000 IJ SOLN
INTRAMUSCULAR | Status: AC
  Filled 2020-12-23: qty 30

## 2020-12-23 MED ORDER — BUPIVACAINE IN DEXTROSE 0.75-8.25 % IT SOLN
INTRATHECAL | Status: DC | PRN
  Administered 2020-12-23: 1.6 mL via INTRATHECAL

## 2020-12-23 MED ORDER — DEXAMETHASONE SODIUM PHOSPHATE 10 MG/ML IJ SOLN
INTRAMUSCULAR | Status: AC
  Filled 2020-12-23: qty 1

## 2020-12-23 MED ORDER — METOPROLOL SUCCINATE ER 25 MG PO TB24
25.0000 mg | ORAL_TABLET | Freq: Every day | ORAL | Status: DC
  Administered 2020-12-24 – 2020-12-27 (×4): 25 mg via ORAL
  Filled 2020-12-23 (×4): qty 1

## 2020-12-23 MED ORDER — OXYCODONE HCL 5 MG PO TABS
5.0000 mg | ORAL_TABLET | ORAL | Status: DC | PRN
  Administered 2020-12-27 (×2): 10 mg via ORAL
  Administered 2020-12-28 (×2): 5 mg via ORAL
  Administered 2020-12-28 – 2020-12-30 (×3): 10 mg via ORAL
  Filled 2020-12-23 (×2): qty 2
  Filled 2020-12-23 (×2): qty 1
  Filled 2020-12-23 (×3): qty 2

## 2020-12-23 MED ORDER — CLINDAMYCIN PHOSPHATE 900 MG/50ML IV SOLN
900.0000 mg | INTRAVENOUS | Status: AC
  Administered 2020-12-23: 900 mg via INTRAVENOUS
  Filled 2020-12-23: qty 50

## 2020-12-23 MED ORDER — ONDANSETRON HCL 4 MG/2ML IJ SOLN
INTRAMUSCULAR | Status: AC
  Filled 2020-12-23: qty 2

## 2020-12-23 MED ORDER — OXYCODONE HCL 5 MG/5ML PO SOLN: 5.0000 mg | Freq: Once | ORAL | Status: DC | PRN

## 2020-12-23 MED ORDER — PROPOFOL 10 MG/ML IV BOLUS
INTRAVENOUS | Status: DC | PRN
  Administered 2020-12-23: 20 mg via INTRAVENOUS

## 2020-12-23 MED ORDER — KETOROLAC TROMETHAMINE 30 MG/ML IJ SOLN
30.0000 mg | Freq: Once | INTRAMUSCULAR | Status: AC
  Administered 2020-12-23: 30 mg via INTRAVENOUS
  Filled 2020-12-23: qty 1

## 2020-12-23 MED ORDER — METOCLOPRAMIDE HCL 5 MG/ML IJ SOLN: 5.0000 mg | Freq: Three times a day (TID) | INTRAMUSCULAR | Status: DC | PRN

## 2020-12-23 MED ORDER — MIDAZOLAM HCL 2 MG/2ML IJ SOLN
1.0000 mg | INTRAMUSCULAR | Status: AC
  Administered 2020-12-23: 2 mg via INTRAVENOUS
  Filled 2020-12-23: qty 2

## 2020-12-23 MED ORDER — ACETAMINOPHEN 325 MG PO TABS
325.0000 mg | ORAL_TABLET | Freq: Four times a day (QID) | ORAL | Status: DC | PRN
  Administered 2020-12-25 – 2020-12-31 (×7): 650 mg via ORAL
  Filled 2020-12-23 (×7): qty 2

## 2020-12-23 MED ORDER — DEXAMETHASONE SODIUM PHOSPHATE 10 MG/ML IJ SOLN
INTRAMUSCULAR | Status: DC | PRN
  Administered 2020-12-23: 6 mg via INTRAVENOUS

## 2020-12-23 MED ORDER — PHENYLEPHRINE HCL-NACL 20-0.9 MG/250ML-% IV SOLN
INTRAVENOUS | Status: DC | PRN
  Administered 2020-12-23: 20 ug/min via INTRAVENOUS

## 2020-12-23 MED ORDER — ACETAMINOPHEN 10 MG/ML IV SOLN
1000.0000 mg | Freq: Once | INTRAVENOUS | Status: DC | PRN
  Administered 2020-12-23: 1000 mg via INTRAVENOUS

## 2020-12-23 MED ORDER — 0.9 % SODIUM CHLORIDE (POUR BTL) OPTIME
TOPICAL | Status: DC | PRN
  Administered 2020-12-23: 1000 mL

## 2020-12-23 MED ORDER — SODIUM CHLORIDE 0.9 % IR SOLN
Status: DC | PRN
  Administered 2020-12-23: 1000 mL

## 2020-12-23 MED ORDER — HYDROMORPHONE HCL 1 MG/ML IJ SOLN
1.0000 mg | INTRAMUSCULAR | Status: DC | PRN
  Administered 2020-12-24: 2 mg via INTRAVENOUS
  Filled 2020-12-23 (×2): qty 2

## 2020-12-23 MED ORDER — OXYCODONE HCL 5 MG PO TABS
10.0000 mg | ORAL_TABLET | ORAL | Status: DC | PRN
  Administered 2020-12-23: 10 mg via ORAL
  Administered 2020-12-23 – 2020-12-25 (×9): 15 mg via ORAL
  Administered 2020-12-26: 10 mg via ORAL
  Administered 2020-12-26: 15 mg via ORAL
  Administered 2020-12-26: 10 mg via ORAL
  Administered 2020-12-26 – 2020-12-27 (×2): 15 mg via ORAL
  Administered 2020-12-27 (×2): 10 mg via ORAL
  Administered 2020-12-28 – 2020-12-29 (×4): 15 mg via ORAL
  Administered 2020-12-30: 10 mg via ORAL
  Administered 2020-12-30 – 2020-12-31 (×3): 15 mg via ORAL
  Filled 2020-12-23: qty 2
  Filled 2020-12-23: qty 3
  Filled 2020-12-23: qty 2
  Filled 2020-12-23 (×9): qty 3
  Filled 2020-12-23: qty 2
  Filled 2020-12-23 (×6): qty 3
  Filled 2020-12-23: qty 2
  Filled 2020-12-23 (×3): qty 3
  Filled 2020-12-23: qty 2
  Filled 2020-12-23 (×3): qty 3

## 2020-12-23 MED ORDER — AMISULPRIDE (ANTIEMETIC) 5 MG/2ML IV SOLN: 10.0000 mg | Freq: Once | INTRAVENOUS | Status: DC | PRN

## 2020-12-23 MED ORDER — ROPIVACAINE HCL 5 MG/ML IJ SOLN
INTRAMUSCULAR | Status: DC | PRN
  Administered 2020-12-23: 30 mL via PERINEURAL

## 2020-12-23 MED ORDER — ADULT MULTIVITAMIN W/MINERALS CH
1.0000 | ORAL_TABLET | Freq: Every day | ORAL | Status: DC
Start: 2020-12-24 — End: 2020-12-31
  Administered 2020-12-24 – 2020-12-31 (×8): 1 via ORAL
  Filled 2020-12-23 (×8): qty 1

## 2020-12-23 MED ORDER — ALUM & MAG HYDROXIDE-SIMETH 200-200-20 MG/5ML PO SUSP: 30.0000 mL | ORAL | Status: DC | PRN

## 2020-12-23 MED ORDER — METHOCARBAMOL 500 MG IVPB - SIMPLE MED
500.0000 mg | Freq: Four times a day (QID) | INTRAVENOUS | Status: DC | PRN
  Filled 2020-12-23: qty 50

## 2020-12-23 MED ORDER — CHLORHEXIDINE GLUCONATE 0.12 % MT SOLN
15.0000 mL | Freq: Once | OROMUCOSAL | Status: AC
  Administered 2020-12-23: 15 mL via OROMUCOSAL

## 2020-12-23 MED ORDER — PHENOL 1.4 % MT LIQD: 1.0000 | OROMUCOSAL | Status: DC | PRN

## 2020-12-23 MED ORDER — SODIUM CHLORIDE 0.9 % IV SOLN: INTRAVENOUS | Status: DC

## 2020-12-23 MED ORDER — BUPIVACAINE-EPINEPHRINE 0.25% -1:200000 IJ SOLN
INTRAMUSCULAR | Status: DC | PRN
  Administered 2020-12-23: 30 mL

## 2020-12-23 MED ORDER — ONDANSETRON HCL 4 MG/2ML IJ SOLN: 4.0000 mg | Freq: Once | INTRAMUSCULAR | Status: DC | PRN

## 2020-12-23 MED ORDER — HYDROMORPHONE HCL 2 MG PO TABS
2.0000 mg | ORAL_TABLET | ORAL | Status: DC | PRN
  Administered 2020-12-25 – 2020-12-31 (×7): 2 mg via ORAL
  Filled 2020-12-23 (×7): qty 1

## 2020-12-23 MED ORDER — ONDANSETRON HCL 4 MG/2ML IJ SOLN: 4.0000 mg | Freq: Four times a day (QID) | INTRAMUSCULAR | Status: DC | PRN

## 2020-12-23 MED ORDER — HYDROXYCHLOROQUINE SULFATE 200 MG PO TABS
200.0000 mg | ORAL_TABLET | Freq: Two times a day (BID) | ORAL | Status: DC
  Administered 2020-12-23 – 2020-12-31 (×16): 200 mg via ORAL
  Filled 2020-12-23 (×17): qty 1

## 2020-12-23 MED ORDER — HYDROMORPHONE HCL 1 MG/ML IJ SOLN
INTRAMUSCULAR | Status: AC
  Filled 2020-12-23: qty 2

## 2020-12-23 MED ORDER — MIDAZOLAM HCL 5 MG/5ML IJ SOLN
INTRAMUSCULAR | Status: DC | PRN
Start: 2020-12-23 — End: 2020-12-23
  Administered 2020-12-23: 2 mg via INTRAVENOUS

## 2020-12-23 MED ORDER — METHOCARBAMOL 500 MG PO TABS
500.0000 mg | ORAL_TABLET | Freq: Four times a day (QID) | ORAL | Status: DC | PRN
  Administered 2020-12-23 – 2020-12-31 (×12): 500 mg via ORAL
  Filled 2020-12-23 (×12): qty 1

## 2020-12-23 MED ORDER — DOCUSATE SODIUM 100 MG PO CAPS
100.0000 mg | ORAL_CAPSULE | Freq: Two times a day (BID) | ORAL | Status: DC
  Administered 2020-12-23 – 2020-12-31 (×16): 100 mg via ORAL
  Filled 2020-12-23 (×17): qty 1

## 2020-12-23 MED ORDER — ORAL CARE MOUTH RINSE: 15.0000 mL | Freq: Once | OROMUCOSAL | Status: AC

## 2020-12-23 MED ORDER — MENTHOL 3 MG MT LOZG: 1.0000 | LOZENGE | OROMUCOSAL | Status: DC | PRN

## 2020-12-23 MED ORDER — METOCLOPRAMIDE HCL 5 MG PO TABS: 5.0000 mg | ORAL_TABLET | Freq: Three times a day (TID) | ORAL | Status: DC | PRN

## 2020-12-23 MED ORDER — RIVAROXABAN 10 MG PO TABS
20.0000 mg | ORAL_TABLET | Freq: Every day | ORAL | Status: DC
  Administered 2020-12-24: 20 mg via ORAL
  Filled 2020-12-23: qty 2

## 2020-12-23 MED ORDER — ONDANSETRON HCL 4 MG/2ML IJ SOLN
INTRAMUSCULAR | Status: DC | PRN
  Administered 2020-12-23: 4 mg via INTRAVENOUS

## 2020-12-23 MED ORDER — HYDROMORPHONE HCL 1 MG/ML IJ SOLN
0.5000 mg | INTRAMUSCULAR | Status: DC | PRN
  Administered 2020-12-23 (×2): 0.5 mg via INTRAVENOUS
  Filled 2020-12-23 (×2): qty 1

## 2020-12-23 MED ORDER — PHENYLEPHRINE HCL-NACL 20-0.9 MG/250ML-% IV SOLN
INTRAVENOUS | Status: AC
  Filled 2020-12-23: qty 250

## 2020-12-23 MED ORDER — PROPOFOL 500 MG/50ML IV EMUL
INTRAVENOUS | Status: DC | PRN
  Administered 2020-12-23: 75 ug/kg/min via INTRAVENOUS

## 2020-12-23 MED ORDER — PROPOFOL 10 MG/ML IV BOLUS
INTRAVENOUS | Status: AC
  Filled 2020-12-23: qty 40

## 2020-12-23 MED ORDER — FENTANYL CITRATE PF 50 MCG/ML IJ SOSY
50.0000 ug | PREFILLED_SYRINGE | INTRAMUSCULAR | Status: AC
  Administered 2020-12-23: 50 ug via INTRAVENOUS
  Filled 2020-12-23: qty 2

## 2020-12-23 MED ORDER — OXYCODONE HCL 5 MG PO TABS: 5.0000 mg | ORAL_TABLET | Freq: Once | ORAL | Status: DC | PRN

## 2020-12-23 MED ORDER — LACTATED RINGERS IV SOLN: INTRAVENOUS | Status: DC

## 2020-12-23 MED ORDER — TRANEXAMIC ACID-NACL 1000-0.7 MG/100ML-% IV SOLN
1000.0000 mg | INTRAVENOUS | Status: AC
  Administered 2020-12-23: 1000 mg via INTRAVENOUS
  Filled 2020-12-23: qty 100

## 2020-12-23 MED ORDER — ACETAMINOPHEN 10 MG/ML IV SOLN
INTRAVENOUS | Status: AC
  Filled 2020-12-23: qty 100

## 2020-12-23 MED ORDER — POVIDONE-IODINE 10 % EX SWAB: 2.0000 "application " | Freq: Once | CUTANEOUS | Status: DC

## 2020-12-23 MED ORDER — FESOTERODINE FUMARATE ER 4 MG PO TB24
4.0000 mg | ORAL_TABLET | Freq: Every day | ORAL | Status: DC
  Administered 2020-12-23 – 2020-12-31 (×9): 4 mg via ORAL
  Filled 2020-12-23 (×9): qty 1

## 2020-12-23 MED ORDER — METHOCARBAMOL 500 MG IVPB - SIMPLE MED
INTRAVENOUS | Status: AC
  Administered 2020-12-23: 500 mg
  Filled 2020-12-23: qty 50

## 2020-12-23 MED ORDER — MIDAZOLAM HCL 2 MG/2ML IJ SOLN
INTRAMUSCULAR | Status: AC
  Filled 2020-12-23: qty 2

## 2020-12-23 MED ORDER — POLYETHYLENE GLYCOL 3350 17 G PO PACK: 17.0000 g | PACK | Freq: Every day | ORAL | Status: DC | PRN

## 2020-12-23 MED ORDER — CLINDAMYCIN PHOSPHATE 600 MG/50ML IV SOLN
600.0000 mg | Freq: Four times a day (QID) | INTRAVENOUS | Status: AC
  Administered 2020-12-23 – 2020-12-24 (×2): 600 mg via INTRAVENOUS
  Filled 2020-12-23 (×2): qty 50

## 2020-12-23 MED ORDER — CLONIDINE HCL (ANALGESIA) 100 MCG/ML EP SOLN
EPIDURAL | Status: DC | PRN
  Administered 2020-12-23: 100 ug

## 2020-12-23 MED ORDER — PHENYLEPHRINE 40 MCG/ML (10ML) SYRINGE FOR IV PUSH (FOR BLOOD PRESSURE SUPPORT)
PREFILLED_SYRINGE | INTRAVENOUS | Status: DC | PRN
  Administered 2020-12-23 (×2): 80 ug via INTRAVENOUS

## 2020-12-23 MED ORDER — NITROFURANTOIN MONOHYD MACRO 100 MG PO CAPS
100.0000 mg | ORAL_CAPSULE | Freq: Every day | ORAL | Status: DC
  Administered 2020-12-23 – 2020-12-31 (×9): 100 mg via ORAL
  Filled 2020-12-23 (×9): qty 1

## 2020-12-23 MED ORDER — FENTANYL CITRATE (PF) 100 MCG/2ML IJ SOLN
INTRAMUSCULAR | Status: AC
  Filled 2020-12-23: qty 2

## 2020-12-23 MED ORDER — DIPHENHYDRAMINE HCL 12.5 MG/5ML PO ELIX: 12.5000 mg | ORAL_SOLUTION | ORAL | Status: DC | PRN

## 2020-12-23 MED ORDER — HYDROMORPHONE HCL 1 MG/ML IJ SOLN: 0.2500 mg | INTRAMUSCULAR | Status: DC | PRN

## 2020-12-23 MED ORDER — ONDANSETRON HCL 4 MG PO TABS: 4.0000 mg | ORAL_TABLET | Freq: Four times a day (QID) | ORAL | Status: DC | PRN

## 2020-12-23 SURGICAL SUPPLY — 68 items
APL SKNCLS STERI-STRIP NONHPOA (GAUZE/BANDAGES/DRESSINGS)
BAG COUNTER SPONGE SURGICOUNT (BAG) ×2 IMPLANT
BAG SPEC THK2 15X12 ZIP CLS (MISCELLANEOUS) ×1
BAG SPNG CNTER NS LX DISP (BAG) ×1
BAG ZIPLOCK 12X15 (MISCELLANEOUS) ×2 IMPLANT
BASEPLATE TIBIAL TRIATH (Orthopedic Implant) ×2 IMPLANT
BENZOIN TINCTURE PRP APPL 2/3 (GAUZE/BANDAGES/DRESSINGS) IMPLANT
BLADE SAG 18X100X1.27 (BLADE) ×2 IMPLANT
BLADE SURG SZ10 CARB STEEL (BLADE) ×4 IMPLANT
BNDG CMPR MED 10X6 ELC LF (GAUZE/BANDAGES/DRESSINGS) ×1
BNDG ELASTIC 6X10 VLCR STRL LF (GAUZE/BANDAGES/DRESSINGS) ×2 IMPLANT
BNDG ELASTIC 6X5.8 VLCR STR LF (GAUZE/BANDAGES/DRESSINGS) ×4 IMPLANT
BOWL SMART MIX CTS (DISPOSABLE) IMPLANT
BSPLAT TIB 4 UNV CMNT TL STAB (Orthopedic Implant) ×1 IMPLANT
CEMENT BONE SIMPLEX SPEEDSET (Cement) ×4 IMPLANT
COOLER ICEMAN CLASSIC (MISCELLANEOUS) ×2 IMPLANT
COVER SURGICAL LIGHT HANDLE (MISCELLANEOUS) ×2 IMPLANT
CUFF TOURN SGL QUICK 34 (TOURNIQUET CUFF) ×2
CUFF TOURN SGL QUICK 42 (TOURNIQUET CUFF) ×2 IMPLANT
CUFF TRNQT CYL 34X4.125X (TOURNIQUET CUFF) ×1 IMPLANT
DECANTER SPIKE VIAL GLASS SM (MISCELLANEOUS) IMPLANT
DRAPE INCISE IOBAN 66X45 STRL (DRAPES) ×2 IMPLANT
DRAPE U-SHAPE 47X51 STRL (DRAPES) ×2 IMPLANT
DRSG PAD ABDOMINAL 8X10 ST (GAUZE/BANDAGES/DRESSINGS) ×2 IMPLANT
DURAPREP 26ML APPLICATOR (WOUND CARE) ×2 IMPLANT
ELECT BLADE TIP CTD 4 INCH (ELECTRODE) ×2 IMPLANT
ELECT REM PT RETURN 15FT ADLT (MISCELLANEOUS) ×2 IMPLANT
FEMORAL PEG DISTAL FIXATION (Orthopedic Implant) ×2 IMPLANT
FEMORAL TRIATH POST STAB SZ 4 (Orthopedic Implant) ×2 IMPLANT
GAUZE SPONGE 4X4 12PLY STRL (GAUZE/BANDAGES/DRESSINGS) ×2 IMPLANT
GAUZE XEROFORM 1X8 LF (GAUZE/BANDAGES/DRESSINGS) ×4 IMPLANT
GLOVE SRG 8 PF TXTR STRL LF DI (GLOVE) ×2 IMPLANT
GLOVE SURG ENC MOIS LTX SZ7.5 (GLOVE) ×2 IMPLANT
GLOVE SURG NEOPR MICRO LF SZ8 (GLOVE) ×2 IMPLANT
GLOVE SURG UNDER POLY LF SZ8 (GLOVE) ×4
GOWN STRL REUS W/TWL XL LVL3 (GOWN DISPOSABLE) ×4 IMPLANT
HANDPIECE INTERPULSE COAX TIP (DISPOSABLE) ×2
HOLDER FOLEY CATH W/STRAP (MISCELLANEOUS) ×2 IMPLANT
IMMOBILIZER KNEE 20 (SOFTGOODS) ×2
IMMOBILIZER KNEE 20 THIGH 36 (SOFTGOODS) ×1 IMPLANT
INSERT TIB 4 13XPOST STAB BRNG (Insert) ×1 IMPLANT
INSERT TIB TRIATH 4X13 (Insert) ×2 IMPLANT
INSERT TIBIAL STABILIZ 4 13MM (Insert) ×2 IMPLANT
INSRT TIB 4 13XPOST STAB BRNG (Insert) ×1 IMPLANT
KIT TURNOVER KIT A (KITS) IMPLANT
NS IRRIG 1000ML POUR BTL (IV SOLUTION) ×2 IMPLANT
PACK TOTAL KNEE CUSTOM (KITS) ×2 IMPLANT
PAD COLD SHLDR WRAP-ON (PAD) ×2 IMPLANT
PADDING CAST ABS 6INX4YD NS (CAST SUPPLIES) ×1
PADDING CAST ABS COTTON 6X4 NS (CAST SUPPLIES) ×1 IMPLANT
PADDING CAST COTTON 6X4 STRL (CAST SUPPLIES) ×4 IMPLANT
PATELLA TRIATHLON SZ 29 9 MM (Orthopedic Implant) ×2 IMPLANT
PIN FLUTED HEDLESS FIX 3.5X1/8 (PIN) ×2 IMPLANT
PROTECTOR NERVE ULNAR (MISCELLANEOUS) ×2 IMPLANT
SET HNDPC FAN SPRY TIP SCT (DISPOSABLE) ×1 IMPLANT
SET PAD KNEE POSITIONER (MISCELLANEOUS) ×2 IMPLANT
SPONGE T-LAP 18X18 ~~LOC~~+RFID (SPONGE) ×4 IMPLANT
STAPLER VISISTAT 35W (STAPLE) ×2 IMPLANT
STRIP CLOSURE SKIN 1/2X4 (GAUZE/BANDAGES/DRESSINGS) IMPLANT
SUT MNCRL AB 4-0 PS2 18 (SUTURE) IMPLANT
SUT VIC AB 0 CT1 27 (SUTURE) ×2
SUT VIC AB 0 CT1 27XBRD ANTBC (SUTURE) ×1 IMPLANT
SUT VIC AB 1 CT1 36 (SUTURE) ×6 IMPLANT
SUT VIC AB 2-0 CT1 27 (SUTURE) ×4
SUT VIC AB 2-0 CT1 TAPERPNT 27 (SUTURE) ×2 IMPLANT
TRAY FOLEY MTR SLVR 14FR STAT (SET/KITS/TRAYS/PACK) ×2 IMPLANT
TRAY FOLEY MTR SLVR 16FR STAT (SET/KITS/TRAYS/PACK) ×2 IMPLANT
WATER STERILE IRR 1000ML POUR (IV SOLUTION) ×4 IMPLANT

## 2020-12-23 NOTE — Anesthesia Procedure Notes (Signed)
Procedure Name: MAC Date/Time: 12/23/2020 12:10 PM Performed by: West Pugh, CRNA Pre-anesthesia Checklist: Patient identified, Emergency Drugs available, Suction available, Patient being monitored and Timeout performed Patient Re-evaluated:Patient Re-evaluated prior to induction Oxygen Delivery Method: Simple face mask Preoxygenation: Pre-oxygenation with 100% oxygen Induction Type: IV induction Placement Confirmation: positive ETCO2 Dental Injury: Teeth and Oropharynx as per pre-operative assessment

## 2020-12-23 NOTE — Anesthesia Procedure Notes (Signed)
Spinal  Patient location during procedure: OR Start time: 12/23/2020 12:12 PM End time: 12/23/2020 12:16 PM Reason for block: surgical anesthesia Staffing Performed: resident/CRNA  Anesthesiologist: Barnet Glasgow, MD Resident/CRNA: West Pugh, CRNA Preanesthetic Checklist Completed: patient identified, IV checked, site marked, risks and benefits discussed, surgical consent, monitors and equipment checked, pre-op evaluation and timeout performed Spinal Block Patient position: sitting Prep: DuraPrep and site prepped and draped Patient monitoring: heart rate, continuous pulse ox and blood pressure Approach: midline Location: L3-4 Injection technique: single-shot Needle Needle type: Pencan and Introducer  Needle gauge: 24 G Needle length: 10 cm Assessment Sensory level: T4 Events: CSF return Additional Notes IV functioning, monitors applied to pt. Expiration date of kit checked and confirmed to be in date. Sterile prep and drape, hand hygiene and sterile gloved used. Pt was positioned and spine was prepped in sterile fashion. Skin was anesthetized with lidocaine. Free flow of clear CSF obtained prior to injecting local anesthetic into CSF. Spinal needle aspirated freely following injection. Needle was carefully withdrawn, and pt tolerated procedure well. Loss of motor and sensory on exam post injection. Dr Valma Cava present for procedure. Last dose of lovenox taken at 10am 12-22-20 per patient report.

## 2020-12-23 NOTE — Progress Notes (Signed)
AssistedDr. Houser with right, ultrasound guided, adductor canal block. Side rails up, monitors on throughout procedure. See vital signs in flow sheet. Tolerated Procedure well.  

## 2020-12-23 NOTE — Evaluation (Signed)
Physical Therapy Evaluation Patient Details Name: [name] MRN: [id number] DOB: [birth year] Today's Date: 12/23/2020  History of Present Illness  Patient is 60 y.o. female s/p Rt TKA on 12/23/20 with PMH significant for anemia, OA, Afib, CHF, DVT, GERD, HLD, PVD, PE, Lt TKA in 2018.    Clinical Impression  [name] is a 60 y.o. female POD 0 s/p Rt TKA. Patient reports independence with mobility at baseline. Patient is now limited by functional impairments (see PT problem list below) and requires min assist for transfers and gait with RW. Patient was limited greatly by pain and had poor safety awareness during session requiring repeated cues for safety with transfer. Pt took several small steps with RW and mod assist to move towards [facility] prior to return to supine. Patient will benefit from continued skilled PT interventions to address impairments and progress towards PLOF. Acute PT will follow to progress mobility and stair training in preparation for safe discharge home.        Recommendations for follow up therapy are one component of a multi-disciplinary discharge planning process, led by the attending physician.  Recommendations may be updated based on patient status, additional functional criteria and insurance authorization.  Follow Up Recommendations Follow physician's recommendations for discharge plan and follow up therapies    Assistance Recommended at Discharge Frequent or constant Supervision/Assistance  Functional Status Assessment Patient has had a recent decline in their functional status and demonstrates the ability to make significant improvements in function in a reasonable and predictable amount of time.  Equipment Recommendations  Rolling walker (2 wheels)    Recommendations for Other Services       Precautions / Restrictions Precautions Precautions: Fall Restrictions Weight Bearing Restrictions: No Other Position/Activity Restrictions: WBAT       Mobility  Bed Mobility Overal bed mobility: Needs Assistance Bed Mobility: Supine to Sit;Sit to Supine     Supine to sit: Min assist;HOB elevated Sit to supine: Mod assist   General bed mobility comments: Cues to use bed rail and min assist to bring Rt LE off EOB, Mod assist to bring Bil LE back to supine.    Transfers Overall transfer level: Needs assistance Equipment used: Rolling walker (2 wheels) Transfers: Sit to/from Stand Sit to Stand: Min assist           General transfer comment: pt attempted to stand with IV pole and cues needed for safety. cues for safe hand placement and technique with RW. Assist to steady wtih power up from EOB.    Ambulation/Gait Ambulation/Gait assistance: Mod assist Gait Distance (Feet): 2 Feet Assistive device: Rolling walker (2 wheels) Gait Pattern/deviations: Step-to pattern;Decreased stride length;Decreased weight shift to right;Decreased step length - right Gait velocity: decr     General Gait Details: pt took small side steps at EOB with RW and mod assist to stabilize walker and guard Rt Knee, no buckling noted.  Stairs            Wheelchair Mobility    Modified Rankin (Stroke Patients Only)       Balance Overall balance assessment: Needs assistance Sitting-balance support: Feet supported;Bilateral upper extremity supported Sitting balance-Leahy Scale: Good     Standing balance support: Reliant on assistive device for balance;During functional activity;Bilateral upper extremity supported Standing balance-Leahy Scale: Poor                               Pertinent Vitals/Pain Pain Assessment:  Faces Faces Pain Scale: Hurts whole lot Pain Location: Rt knee Pain Descriptors / Indicators: Aching;Discomfort;Grimacing;Guarding;Moaning Pain Intervention(s): Limited activity within patient's tolerance;Monitored during session;Repositioned;Ice applied;Patient requesting pain meds-RN notified    Home Living  Family/patient expects to be discharged to:: Private residence Living Arrangements: Spouse/significant other Available Help at Discharge: Family Type of Home: House Home Access: Stairs to enter Entrance Stairs-Rails: Psychiatric nurse of Steps: 1+1   Home Layout: One level Home Equipment: BSC/3in1;Cane - single point;Standard Environmental consultant;Toilet riser (recliner/lift chair)      Prior Function Prior Level of Function : Independent/Modified Independent;Working/employed                     Hand Dominance   Dominant Hand: Right    Extremity/Trunk Assessment   Upper Extremity Assessment Upper Extremity Assessment: Generalized weakness    Lower Extremity Assessment Lower Extremity Assessment: Generalized weakness;RLE deficits/detail RLE Deficits / Details: limited by pain RLE: Unable to fully assess due to pain RLE Sensation: WNL RLE Coordination: WNL    Cervical / Trunk Assessment Cervical / Trunk Assessment: Other exceptions Cervical / Trunk Exceptions: habitus  Communication   Communication: No difficulties  Cognition Arousal/Alertness: Awake/alert Behavior During Therapy: WFL for tasks assessed/performed Overall Cognitive Status: Within Functional Limits for tasks assessed                                          General Comments      Exercises     Assessment/Plan    PT Assessment Patient needs continued PT services  PT Problem List Decreased strength;Decreased range of motion;Decreased balance;Decreased mobility;Decreased activity tolerance;Decreased knowledge of use of DME;Decreased safety awareness;Decreased knowledge of precautions;Pain;Obesity       PT Treatment Interventions DME instruction;Gait training;Stair training;Functional mobility training;Therapeutic activities;Therapeutic exercise;Balance training;Patient/family education    PT Goals (Current goals can be found in the Care Plan section)  Acute Rehab PT  Goals Patient Stated Goal: stop hurting PT Goal Formulation: With patient Time For Goal Achievement: 12/30/20 Potential to Achieve Goals: Good    Frequency 7X/week   Barriers to discharge        Co-evaluation               AM-PAC PT "6 Clicks" Mobility  Outcome Measure Help needed turning from your back to your side while in a flat bed without using bedrails?: A Little Help needed moving from lying on your back to sitting on the side of a flat bed without using bedrails?: A Little Help needed moving to and from a bed to a chair (including a wheelchair)?: A Little Help needed standing up from a chair using your arms (e.g., wheelchair or bedside chair)?: A Little Help needed to walk in hospital room?: A Lot Help needed climbing 3-5 steps with a railing? : A Lot 6 Click Score: 16    End of Session Equipment Utilized During Treatment: Gait belt Activity Tolerance: Patient limited by pain Patient left: in bed;with call bell/phone within reach;with bed alarm set;with SCD's reapplied;with nursing/sitter in room;with family/visitor present Nurse Communication: Mobility status;Patient requests pain meds PT Visit Diagnosis: Muscle weakness (generalized) (M62.81);Difficulty in walking, not elsewhere classified (R26.2);Pain Pain - Right/Left: Right Pain - part of body: Knee    Time: 1655-1720 PT Time Calculation (min) (ACUTE ONLY): 25 min   Charges:   PT Evaluation $PT Eval Low Complexity: 1 Low  Verner Mould, DPT Acute Rehabilitation Services Office [phone] Pager [phone]   Jacques Navy 12/23/2020, 5:45 PM

## 2020-12-23 NOTE — Plan of Care (Signed)
  Problem: Pain Managment: Goal: General experience of comfort will improve Outcome: Progressing   Problem: Activity: Goal: Risk for activity intolerance will decrease Outcome: Progressing   Problem: Education: Goal: Knowledge of General Education information will improve Description: Including pain rating scale, medication(s)/side effects and non-pharmacologic comfort measures Outcome: Progressing

## 2020-12-23 NOTE — Transfer of Care (Signed)
Immediate Anesthesia Transfer of Care Note  Patient: [name]  Procedure(s) Performed: RIGHT TOTAL KNEE ARTHROPLASTY (Right: Knee)  Patient Location: PACU  Anesthesia Type:Spinal and MAC combined with regional for post-op pain  Level of Consciousness: awake, drowsy and patient cooperative  Airway & Oxygen Therapy: Patient Spontanous Breathing and Patient connected to face mask oxygen  Post-op Assessment: Report given to RN and Post -op Vital signs reviewed and stable  Post vital signs: Reviewed and stable  Last Vitals:  Vitals Value Taken Time  BP 118/64 12/23/20 1418  Temp    Pulse 65 12/23/20 1420  Resp 27 12/23/20 1420  SpO2 100 % 12/23/20 1420  Vitals shown include unvalidated device data.  Last Pain:  Vitals:   12/23/20 1047  TempSrc:   PainSc: 0-No pain      Patients Stated Pain Goal: 5 (45/99/77 4142)  Complications: No notable events documented.

## 2020-12-23 NOTE — Anesthesia Procedure Notes (Addendum)
Anesthesia Regional Block: Adductor canal block   Pre-Anesthetic Checklist: , timeout performed,  Correct Patient, Correct Site, Correct Laterality,  Correct Procedure, Correct Position, site marked,  Risks and benefits discussed,  Surgical consent,  Pre-op evaluation,  At surgeon's request and post-op pain management  Laterality: Lower and Right  Prep: chloraprep       Needles:  Injection technique: Single-shot  Needle Type: Echogenic Needle     Needle Length: 9cm  Needle Gauge: 22     Additional Needles:   Procedures:,,,, ultrasound used (permanent image in chart),,    Narrative:  Start time: 12/23/2020 11:30 AM End time: 12/23/2020 11:39 AM Injection made incrementally with aspirations every 5 mL.  Performed by: Personally  Anesthesiologist: Barnet Glasgow, MD  Additional Notes: Block assessed prior to surgery. Pt tolerated procedure well.

## 2020-12-23 NOTE — Brief Op Note (Signed)
12/23/2020  1:52 PM  PATIENT:  [name]  60 y.o. female  PRE-OPERATIVE DIAGNOSIS:  osteoarthritis right knee  POST-OPERATIVE DIAGNOSIS:  osteoarthritis right knee  PROCEDURE:  Procedure(s): RIGHT TOTAL KNEE ARTHROPLASTY (Right)  SURGEON:  Surgeon(s) and Role:    [name], MD - Primary  PHYSICIAN ASSISTANT:  Benita Stabile, PA-C  ANESTHESIA:   local, regional, and spinal  COUNTS:  YES  TOURNIQUET:   Total Tourniquet Time Documented: Thigh (Right) - 55 minutes Total: Thigh (Right) - 55 minutes   DICTATION: .Other Dictation: Dictation Number [id number]  PLAN OF CARE: Admit for overnight observation  PATIENT DISPOSITION:  PACU - hemodynamically stable.   Delay start of Pharmacological VTE agent (>24hrs) due to surgical blood loss or risk of bleeding: no

## 2020-12-23 NOTE — Anesthesia Postprocedure Evaluation (Signed)
Anesthesia Post Note  Patient: [name]  Procedure(s) Performed: RIGHT TOTAL KNEE ARTHROPLASTY (Right: Knee)     Patient location during evaluation: Nursing Unit Anesthesia Type: Spinal Level of consciousness: oriented and awake and alert Pain management: pain level controlled Vital Signs Assessment: post-procedure vital signs reviewed and stable Respiratory status: spontaneous breathing and respiratory function stable Cardiovascular status: blood pressure returned to baseline and stable Postop Assessment: no headache, no backache, no apparent nausea or vomiting and patient able to bend at knees Anesthetic complications: no   No notable events documented.  Last Vitals:  Vitals:   12/23/20 1530 12/23/20 1610  BP: 126/66 129/77  Pulse: (!) 52 (!) 55  Resp: 16 12  Temp:  36.6 C  SpO2: 97% 100%    Last Pain:  Vitals:   12/23/20 1610  TempSrc: Oral  PainSc:                  Barnet Glasgow

## 2020-12-23 NOTE — Interval H&P Note (Signed)
History and Physical Interval Note: The patient understands that she is here today for a right total knee replacement to treat her right knee osteoarthritis.  There has been no acute or interval change in her medical status.  Please see recent H&P.  The risks and benefits of surgery been discussed in detail and informed consent is obtained.  The right knee has been marked.  12/23/2020 11:06 AM  [name]  has presented today for surgery, with the diagnosis of osteoarthritis right knee.  The various methods of treatment have been discussed with the patient and family. After consideration of risks, benefits and other options for treatment, the patient has consented to  Procedure(s): RIGHT TOTAL KNEE ARTHROPLASTY (Right) as a surgical intervention.  The patient's history has been reviewed, patient examined, no change in status, stable for surgery.  I have reviewed the patient's chart and labs.  Questions were answered to the patient's satisfaction.     [name]

## 2020-12-24 LAB — CBC
HCT: 37 % (ref 36.0–46.0)
Hemoglobin: 11.2 g/dL — ABNORMAL LOW (ref 12.0–15.0)
MCH: 24.6 pg — ABNORMAL LOW (ref 26.0–34.0)
MCHC: 30.3 g/dL (ref 30.0–36.0)
MCV: 81.1 fL (ref 80.0–100.0)
Platelets: 177 10*3/uL (ref 150–400)
RBC: 4.56 MIL/uL (ref 3.87–5.11)
RDW: 17 % — ABNORMAL HIGH (ref 11.5–15.5)
WBC: 6 10*3/uL (ref 4.0–10.5)
nRBC: 0 % (ref 0.0–0.2)

## 2020-12-24 LAB — BASIC METABOLIC PANEL
Anion gap: 6 (ref 5–15)
BUN: 8 mg/dL (ref 8–23)
CO2: 24 mmol/L (ref 22–32)
Calcium: 8.2 mg/dL — ABNORMAL LOW (ref 8.9–10.3)
Chloride: 107 mmol/L (ref 98–111)
Creatinine, Ser: 0.71 mg/dL (ref 0.44–1.00)
GFR, Estimated: >60 mL/min (ref >60)
Glucose, Bld: 153 mg/dL — ABNORMAL HIGH (ref 70–99)
Potassium: 4.2 mmol/L (ref 3.5–5.1)
Sodium: 137 mmol/L (ref 135–145)

## 2020-12-24 MED ORDER — OXYCODONE HCL 5 MG PO TABS: 5.0000 mg | ORAL_TABLET | ORAL | 0 refills | Status: DC | PRN

## 2020-12-24 MED ORDER — KETOROLAC TROMETHAMINE 15 MG/ML IJ SOLN
15.0000 mg | Freq: Four times a day (QID) | INTRAMUSCULAR | Status: AC
  Administered 2020-12-24 (×3): 15 mg via INTRAVENOUS
  Filled 2020-12-24 (×3): qty 1

## 2020-12-24 MED ORDER — METHOCARBAMOL 500 MG PO TABS: 500.0000 mg | ORAL_TABLET | Freq: Four times a day (QID) | ORAL | 1 refills | Status: DC | PRN

## 2020-12-24 NOTE — Discharge Instructions (Signed)

## 2020-12-24 NOTE — Progress Notes (Signed)
Subjective: 1 Day Post-Op Procedure(s) (LRB): RIGHT TOTAL KNEE ARTHROPLASTY (Right) Patient reports pain as severe.    Objective: Vital signs in last 24 hours: Temp:  [97.5 F (36.4 C)-98.3 F (36.8 C)] 98.3 F (36.8 C) (11/19 1051) Pulse Rate:  [52-69] 58 (11/19 1051) Resp:  [12-26] 18 (11/19 1051) BP: (115-172)/(62-83) 122/62 (11/19 1051) SpO2:  [95 %-100 %] 97 % (11/19 1051)  Intake/Output from previous day: 11/18 0701 - 11/19 0700 In: 2671.3 [P.O.:240; I.V.:2031.3; IV Piggyback:400] Out: 4656 [Urine:4450; Blood:25] Intake/Output this shift: Total I/O In: -  Out: 200 [Urine:200]  Recent Labs    12/24/20 0329  HGB 11.2*   Recent Labs    12/24/20 0329  WBC 6.0  RBC 4.56  HCT 37.0  PLT 177   Recent Labs    12/24/20 0329  NA 137  K 4.2  CL 107  CO2 24  BUN 8  CREATININE 0.71  GLUCOSE 153*  CALCIUM 8.2*   No results for input(s): LABPT, INR in the last 72 hours.  Sensation intact distally Intact pulses distally Dorsiflexion/Plantar flexion intact Incision: scant drainage No cellulitis present Compartment soft   Assessment/Plan: 1 Day Post-Op Procedure(s) (LRB): RIGHT TOTAL KNEE ARTHROPLASTY (Right) Up with therapy Discharge Sunday vs Monday depending on pain control.     [name] 12/24/2020, 11:16 AM

## 2020-12-24 NOTE — Plan of Care (Signed)

## 2020-12-24 NOTE — Op Note (Signed)
[name], GERMAIN MEDICAL RECORD NO: [id number] ACCOUNT NO: [id number] DATE OF BIRTH: 1960/07/28 FACILITY: [facility] LOCATION: [id number] PHYSICIAN: Lind Guest. Ninfa Linden, MD  Operative Report   DATE OF PROCEDURE: 12/23/2020  PREOPERATIVE DIAGNOSIS:  Primary osteoarthritis and degenerative joint disease, right knee.  POSTOPERATIVE DIAGNOSIS:  Primary osteoarthritis and degenerative joint disease, right knee.  PROCEDURE:  Right total knee arthroplasty.  IMPLANTS:  Stryker Triathlon cemented knee system with size 4 femur, size 4 tibial universal baseplate, size 13 mm thickness constrained TS polyethylene insert, size 29 patellar button.  SURGEON:  Lind Guest. Ninfa Linden, MD  ASSISTANT:  Erskine Emery, PA-C  ANESTHESIA: 1.  Right lower extremity adductor canal block. 2.  Spinal.  ANTIBIOTICS: 900 mg IV clindamycin.  TOURNIQUET TIME:  56 minutes.  BLOOD LOSS:  Less 100 mL.  COMPLICATIONS:  None.  INDICATIONS:  The patient is a 60 year old female well known to me.  I actually replaced her left knee successfully in 2018.  Her right knee shows severe end-stage arthritis and is detrimentally affecting her mobility, her quality of life and activities  of daily living to the point she does wish to proceed with knee replacement on the right side.  Her only complicating feature is the fact that she is on blood thinning medications and she has to be bridged from Xarelto to Lovenox.  Also, she is morbidly  obese with a high BMI.  She understands that there is certainly heightened risk given her comorbidities that she has much higher risk of nerve and vessel injury, soft tissue issues, implant failure, DVT, fracture, infection and acute blood loss anemia.   She understands our goals are to decrease pain, improve mobility and overall improve quality of life.  DESCRIPTION OF PROCEDURE:  After informed consent was obtained, appropriate right leg was marked and right knee was marked and adductor  canal block was obtained in the right lower extremity in the holding room.  She was then brought to the operating room  and sat up on the operating table.  Spinal anesthesia was obtained.  She was laid in supine position on the operating table.  Foley catheter was placed and a nonsterile tourniquet was placed around her upper right thigh.  Her right thigh, knee, leg,  ankle and foot were prepped and draped with DuraPrep and sterile drapes including a sterile stockinette.  A timeout was called and she was identified as correct patient, correct right knee.  I then used Esmarch to wrap that leg and tourniquet was  inflated to 300 mmHg pressure.  We then made a direct midline incision over the patella and carried this proximally and distally.  We dissected down the knee joint, carried out a medial parapatellar arthrotomy, finding very large joint effusion and  significant periarticular osteophytes and cartilage wear throughout all 3 compartments of the knee. With the knee in a flexed position, we removed osteophytes from all three compartments as well as remnants of ACL, PCL, medial and lateral meniscus.  We  then used the extramedullary cutting guide for making our proximal tibia cut correcting varus and valgus and neutral slope by setting this for taking 9 mm off the high side.  We made this cut without difficulty and brought it down 2 more millimeters.  We  then removed the tibial pins.  We then used the intramedullary guide for the femur using our distal femoral cutting guide for a right knee at 5 degrees externally rotated for 8 mm distal femoral cut.  We made this  cut without difficulty, and brought the  knee back down to full extension. With a 9 mm extension block, she had actually hyperextended.  We then back to the femur and put our femoral sizing guide based off epicondylar axis.  Based on this, we chose a size 4 femur.  We put a 4-in-1 cutting  block for a size 4 femur, made our anterior and posterior  cuts, followed by our chamfer cuts.  We then made our femoral box cut.  Attention was then turned back to the tibia, we chose a size 4 tibial tray for coverage of the tibial plateau setting the  rotation off the tibial tubercle and the femur.  We made our keel punch off of this and then drilled a hole for universal baseplate, knowing that we have a risk of needing to use a constrained liner based on her obesity. With a size 4 tibia tray, we  trialed a size 4 right femur and an 11 mm fixed bearing polyethylene insert.  We then went up to a 13 mm insert.  We did not feel at that time we needed a constrained insert.  We then made our patellar cut and drilled three holes for a size 29 patellar  button.  We then removed all trial instrumentation from the knee after putting it through several cycles of motion.  We dried the knee real well after irrigating and then placed Marcaine around the knee joint arthrotomy.  We then mixed our cement and  cemented our real [employer] Triathlon universal baseplate size 4, followed by cementing a size 4 right femur.  We placed our 13 mm fixed bearing polyethylene insert and right away, we felt like we needed a constrained insert, so we removed that insert and  put our 13 mm constrained insert.  We then cemented our patellar button.  We held the knee completely extended and compressed while the cement hardened.  Once it hardened, we put her through several cycles of motion.  We were pleased with stability and  motion that we could fill in that knee.  We then let the tourniquet down.  Hemostasis obtained with electrocautery.  We closed the arthrotomy with interrupted #1 Vicryl suture followed by 0 Vicryl to close the deep tissue and 2-0 Vicryl to close the  subcutaneous tissue.  The skin was closed with staples.  Xeroform well-padded sterile dressing was applied.  She was taken off the [facility] table and taken to recovery room in stable condition.  All final counts were correct. There  were no complications  noted.  Of note, Erskine Emery, PA-C did assist in entire case and assistance was crucial for facilitating every aspect of this case.   VAI D: 12/23/2020 1:51:02 pm T: 12/24/2020 1:52:00 am  JOB: [id number]/ [id number]

## 2020-12-24 NOTE — Progress Notes (Signed)
Physical Therapy Treatment Patient Details Name: [name] MRN: [id number] DOB: 05/18/1960 Today's Date: 12/24/2020   History of Present Illness Patient is 60 y.o. female s/p Rt TKA on 12/23/20 with PMH significant for anemia, OA, Afib, CHF, DVT, GERD, HLD, PVD, PE, Lt TKA in 2018.    PT Comments    Progressing very slowly with mobility. Pt only walked ~5 feet in the room this session. Mobility is limited by pain. Pt requires encouragement. Will progress activity as tolerated.    Recommendations for follow up therapy are one component of a multi-disciplinary discharge planning process, led by the attending physician.  Recommendations may be updated based on patient status, additional functional criteria and insurance authorization.  Follow Up Recommendations  Follow physician's recommendations for discharge plan and follow up therapies     Assistance Recommended at Discharge Frequent or constant Supervision/Assistance  Equipment Recommendations  Rolling walker (2 wheels) (wide width would be ideal)    Recommendations for Other Services       Precautions / Restrictions Precautions Precautions: Fall Restrictions Weight Bearing Restrictions: No Other Position/Activity Restrictions: WBAT     Mobility  Bed Mobility Overal bed mobility: Needs Assistance Bed Mobility: Supine to Sit     Supine to sit: Min assist;HOB elevated     General bed mobility comments: Pt elevated HOB to max # of degrees-she stated she has an adjustable bed at home. Assist for R LE. INcreased time required.    Transfers Overall transfer level: Needs assistance Equipment used: Rolling walker (2 wheels) Transfers: Sit to/from Stand Sit to Stand: Min assist;From elevated surface           General transfer comment: Increased time. Pt unable to stand without use of bedrail. Cues for safety, technique, hand placement. Stand pivot, bed to bsc, using RW.    Ambulation/Gait Ambulation/Gait  assistance: Min assist Gait Distance (Feet): 5 Feet Assistive device: Rolling walker (2 wheels) Gait Pattern/deviations: Step-to pattern;Trunk flexed;Antalgic       General Gait Details: Pt only able to tolerate taking a few steps in room this session. Distance limited by pain.   Stairs             Wheelchair Mobility    Modified Rankin (Stroke Patients Only)       Balance Overall balance assessment: Needs assistance         Standing balance support: Bilateral upper extremity supported;Reliant on assistive device for balance Standing balance-Leahy Scale: Poor                              Cognition Arousal/Alertness: Awake/alert Behavior During Therapy: WFL for tasks assessed/performed Overall Cognitive Status: Within Functional Limits for tasks assessed                                          Exercises Total Joint Exercises Ankle Circles/Pumps: AROM;Both;10 reps Quad Sets: AROM;Right;10 reps Heel Slides: AAROM;Right;10 reps (limited ROM due to pt resistance and pain) Hip ABduction/ADduction: AAROM;Right;10 reps Straight Leg Raises: AAROM;Right;10 reps Goniometric ROM: 10-45 degrees    General Comments        Pertinent Vitals/Pain Pain Assessment: Faces Faces Pain Scale: Hurts whole lot Pain Location: R knee Pain Descriptors / Indicators: Discomfort;Sore;Grimacing;Moaning Pain Intervention(s): Limited activity within patient's tolerance;Monitored during session;Ice applied;Repositioned    Home Living  Prior Function            PT Goals (current goals can now be found in the care plan section) Progress towards PT goals: Progressing toward goals    Frequency    7X/week      PT Plan Current plan remains appropriate    Co-evaluation              AM-PAC PT "6 Clicks" Mobility   Outcome Measure  Help needed turning from your back to your side while in a flat bed  without using bedrails?: A Little Help needed moving from lying on your back to sitting on the side of a flat bed without using bedrails?: A Little Help needed moving to and from a bed to a chair (including a wheelchair)?: A Little Help needed standing up from a chair using your arms (e.g., wheelchair or bedside chair)?: A Little Help needed to walk in hospital room?: A Lot Help needed climbing 3-5 steps with a railing? : Total 6 Click Score: 15    End of Session Equipment Utilized During Treatment: Gait belt Activity Tolerance: Patient limited by pain Patient left: in chair;with call bell/phone within reach;with chair alarm set   PT Visit Diagnosis: Other abnormalities of gait and mobility (R26.89);Pain Pain - Right/Left: Right Pain - part of body: Knee     Time: 5859-2924 PT Time Calculation (min) (ACUTE ONLY): 29 min  Charges:  $Gait Training: 8-22 mins $Therapeutic Exercise: 8-22 mins                         Doreatha Massed, PT Acute Rehabilitation  Office: [phone] Pager: [phone]

## 2020-12-24 NOTE — Progress Notes (Signed)
Physical Therapy Treatment Patient Details Name: [name] MRN: [id number] DOB: 05-May-1960 Today's Date: 12/24/2020   History of Present Illness Patient is 60 y.o. female s/p Rt TKA on 12/23/20 with PMH significant for anemia, OA, Afib, CHF, DVT, GERD, HLD, PVD, PE, Lt TKA in 2018.    PT Comments    Progressing slowly. Some improvement with mobility, activity tolerance this afternoon-pt made it into hallway. Will continue to progress activity as tolerated.    Recommendations for follow up therapy are one component of a multi-disciplinary discharge planning process, led by the attending physician.  Recommendations may be updated based on patient status, additional functional criteria and insurance authorization.  Follow Up Recommendations  Follow physician's recommendations for discharge plan and follow up therapies     Assistance Recommended at Discharge Frequent or constant Supervision/Assistance  Equipment Recommendations  Rolling walker (2 wheels) (wide width would be ideal)    Recommendations for Other Services       Precautions / Restrictions Precautions Precautions: Fall Restrictions Weight Bearing Restrictions: No Other Position/Activity Restrictions: WBAT     Mobility  Bed Mobility Overal bed mobility: Needs Assistance Bed Mobility: Sit to Supine       Sit to supine: Min assist   General bed mobility comments: Assist for R LE onto bed.    Transfers Overall transfer level: Needs assistance Equipment used: Rolling walker (2 wheels) Transfers: Sit to/from Stand Sit to Stand: Min assist           General transfer comment: Increased time. . Cues for safety, technique, hand placement. Stand pivot, bed to bsc, using RW-incontinent    Ambulation/Gait Ambulation/Gait assistance: Min assist Gait Distance (Feet): 35 Feet Assistive device: Rolling walker (2 wheels) Gait Pattern/deviations: Step-to pattern;Trunk flexed;Antalgic       General Gait  Details: Pt able to walk a short distance into hallway this session. Slow gati speed. Fatigues fairly easily-standing rest break after ~25 feet.   Stairs             Wheelchair Mobility    Modified Rankin (Stroke Patients Only)       Balance Overall balance assessment: Needs assistance         Standing balance support: Bilateral upper extremity supported;Reliant on assistive device for balance Standing balance-Leahy Scale: Poor                              Cognition Arousal/Alertness: Awake/alert Behavior During Therapy: WFL for tasks assessed/performed Overall Cognitive Status: Within Functional Limits for tasks assessed                                 General Comments: a bit drowsy        Exercises      General Comments        Pertinent Vitals/Pain Pain Assessment: 0-10 Pain Score: 6  Pain Location: R knee Pain Descriptors / Indicators: Discomfort;Sore Pain Intervention(s): Limited activity within patient's tolerance;Monitored during session;Ice applied;Repositioned    Home Living                          Prior Function            PT Goals (current goals can now be found in the care plan section) Progress towards PT goals: Progressing toward goals    Frequency    7X/week  PT Plan Current plan remains appropriate    Co-evaluation              AM-PAC PT "6 Clicks" Mobility   Outcome Measure  Help needed turning from your back to your side while in a flat bed without using bedrails?: A Little Help needed moving from lying on your back to sitting on the side of a flat bed without using bedrails?: A Little Help needed moving to and from a bed to a chair (including a wheelchair)?: A Little Help needed standing up from a chair using your arms (e.g., wheelchair or bedside chair)?: A Little Help needed to walk in hospital room?: A Lot Help needed climbing 3-5 steps with a railing? : Total 6 Click  Score: 15    End of Session Equipment Utilized During Treatment: Gait belt Activity Tolerance: Patient limited by fatigue;Patient limited by pain Patient left: in bed;with call bell/phone within reach;with family/visitor present   PT Visit Diagnosis: Other abnormalities of gait and mobility (R26.89);Pain Pain - Right/Left: Right Pain - part of body: Knee     Time: 1435-1457 PT Time Calculation (min) (ACUTE ONLY): 22 min  Charges:  $Gait Training: 8-22 mins                        Doreatha Massed, PT Acute Rehabilitation  Office: [phone] Pager: [phone]

## 2020-12-24 NOTE — Plan of Care (Signed)
Plan of care reviewed and discussed with the patient. 

## 2020-12-25 DIAGNOSIS — M25461 Effusion, right knee: Secondary | ICD-10-CM | POA: Diagnosis present

## 2020-12-25 DIAGNOSIS — Z6841 Body Mass Index (BMI) 40.0 and over, adult: Secondary | ICD-10-CM | POA: Diagnosis not present

## 2020-12-25 DIAGNOSIS — Z86718 Personal history of other venous thrombosis and embolism: Secondary | ICD-10-CM | POA: Diagnosis not present

## 2020-12-25 DIAGNOSIS — Z9884 Bariatric surgery status: Secondary | ICD-10-CM | POA: Diagnosis not present

## 2020-12-25 DIAGNOSIS — Z79899 Other long term (current) drug therapy: Secondary | ICD-10-CM | POA: Diagnosis not present

## 2020-12-25 DIAGNOSIS — Z881 Allergy status to other antibiotic agents status: Secondary | ICD-10-CM | POA: Diagnosis not present

## 2020-12-25 DIAGNOSIS — Z7901 Long term (current) use of anticoagulants: Secondary | ICD-10-CM | POA: Diagnosis not present

## 2020-12-25 DIAGNOSIS — Z880 Allergy status to penicillin: Secondary | ICD-10-CM | POA: Diagnosis not present

## 2020-12-25 DIAGNOSIS — M21061 Valgus deformity, not elsewhere classified, right knee: Secondary | ICD-10-CM | POA: Diagnosis present

## 2020-12-25 DIAGNOSIS — Z888 Allergy status to other drugs, medicaments and biological substances status: Secondary | ICD-10-CM | POA: Diagnosis not present

## 2020-12-25 DIAGNOSIS — Z86711 Personal history of pulmonary embolism: Secondary | ICD-10-CM | POA: Diagnosis not present

## 2020-12-25 DIAGNOSIS — Z8261 Family history of arthritis: Secondary | ICD-10-CM | POA: Diagnosis not present

## 2020-12-25 DIAGNOSIS — I480 Paroxysmal atrial fibrillation: Secondary | ICD-10-CM | POA: Diagnosis present

## 2020-12-25 DIAGNOSIS — K219 Gastro-esophageal reflux disease without esophagitis: Secondary | ICD-10-CM | POA: Diagnosis present

## 2020-12-25 DIAGNOSIS — R7303 Prediabetes: Secondary | ICD-10-CM | POA: Diagnosis present

## 2020-12-25 DIAGNOSIS — M21161 Varus deformity, not elsewhere classified, right knee: Secondary | ICD-10-CM | POA: Diagnosis present

## 2020-12-25 DIAGNOSIS — E1151 Type 2 diabetes mellitus with diabetic peripheral angiopathy without gangrene: Secondary | ICD-10-CM | POA: Diagnosis present

## 2020-12-25 DIAGNOSIS — Z91041 Radiographic dye allergy status: Secondary | ICD-10-CM | POA: Diagnosis not present

## 2020-12-25 DIAGNOSIS — M25561 Pain in right knee: Secondary | ICD-10-CM | POA: Diagnosis present

## 2020-12-25 DIAGNOSIS — E785 Hyperlipidemia, unspecified: Secondary | ICD-10-CM | POA: Diagnosis present

## 2020-12-25 DIAGNOSIS — M25761 Osteophyte, right knee: Secondary | ICD-10-CM | POA: Diagnosis present

## 2020-12-25 DIAGNOSIS — Z96652 Presence of left artificial knee joint: Secondary | ICD-10-CM | POA: Diagnosis present

## 2020-12-25 DIAGNOSIS — M1711 Unilateral primary osteoarthritis, right knee: Secondary | ICD-10-CM | POA: Diagnosis present

## 2020-12-25 DIAGNOSIS — E1121 Type 2 diabetes mellitus with diabetic nephropathy: Secondary | ICD-10-CM | POA: Diagnosis present

## 2020-12-25 MED ORDER — RIVAROXABAN 10 MG PO TABS
20.0000 mg | ORAL_TABLET | Freq: Every day | ORAL | Status: DC
  Administered 2020-12-25 – 2020-12-31 (×7): 20 mg via ORAL
  Filled 2020-12-25 (×2): qty 2
  Filled 2020-12-25: qty 1
  Filled 2020-12-25 (×4): qty 2

## 2020-12-25 NOTE — Progress Notes (Signed)
Physical Therapy Treatment Patient Details Name: [name] MRN: [id number] DOB: 27-Jun-1960 Today's Date: 12/25/2020   History of Present Illness Patient is 60 y.o. female s/p Rt TKA on 12/23/20 with PMH significant for anemia, OA, Afib, CHF, DVT, GERD, HLD, PVD, PE, Lt TKA in 2018.    PT Comments    Progressing with mobility.  Odd breath sounds and pattern while ambulating. Continued bedside O2 monitoring end of session.   Recommendations for follow up therapy are one component of a multi-disciplinary discharge planning process, led by the attending physician.  Recommendations may be updated based on patient status, additional functional criteria and insurance authorization.  Follow Up Recommendations  Follow physician's recommendations for discharge plan and follow up therapies     Assistance Recommended at Discharge Frequent or constant Supervision/Assistance  Equipment Recommendations  Rolling walker (2 wheels) (wide would be ideal)    Recommendations for Other Services       Precautions / Restrictions Precautions Precautions: Fall Restrictions Weight Bearing Restrictions: No Other Position/Activity Restrictions: WBAT     Mobility  Bed Mobility Overal bed mobility: Needs Assistance Bed Mobility: Supine to Sit     Supine to sit: Min assist;HOB elevated     General bed mobility comments: Assist for R LE. Increased time. Bed maximally elevated with heavy reliance on bedrail (pt request)    Transfers Overall transfer level: Needs assistance Equipment used: Rolling walker (2 wheels) Transfers: Sit to/from Stand Sit to Stand: Min assist           General transfer comment: Increased time. Cues for safety, technique, hand placement.    Ambulation/Gait Ambulation/Gait assistance: Min assist Gait Distance (Feet): 45 Feet Assistive device: Rolling walker (2 wheels) Gait Pattern/deviations: Step-to pattern;Trunk flexed;Antalgic       General Gait  Details: Intermittent assist to steady.   Stairs             Wheelchair Mobility    Modified Rankin (Stroke Patients Only)       Balance Overall balance assessment: Needs assistance         Standing balance support: Bilateral upper extremity supported;Reliant on assistive device for balance Standing balance-Leahy Scale: Poor                              Cognition Arousal/Alertness: Awake/alert Behavior During Therapy: WFL for tasks assessed/performed Overall Cognitive Status: Within Functional Limits for tasks assessed                                          Exercises Total Joint Exercises Ankle Circles/Pumps: AROM;Both;10 reps Quad Sets: AROM;Both;10 reps Heel Slides: AAROM;Right;10 reps Hip ABduction/ADduction: AAROM;Right;10 reps Straight Leg Raises: AAROM;Right;10 reps Goniometric ROM: ~10-45 degrees    General Comments        Pertinent Vitals/Pain Pain Assessment: 0-10 Pain Score: 5  Pain Location: R knee Pain Descriptors / Indicators: Discomfort;Sore Pain Intervention(s): Limited activity within patient's tolerance;Monitored during session;Ice applied;Repositioned    Home Living                          Prior Function            PT Goals (current goals can now be found in the care plan section) Progress towards PT goals: Progressing toward goals    Frequency  7X/week      PT Plan Current plan remains appropriate    Co-evaluation              AM-PAC PT "6 Clicks" Mobility   Outcome Measure  Help needed turning from your back to your side while in a flat bed without using bedrails?: A Little Help needed moving from lying on your back to sitting on the side of a flat bed without using bedrails?: A Little Help needed moving to and from a bed to a chair (including a wheelchair)?: A Little Help needed standing up from a chair using your arms (e.g., wheelchair or bedside chair)?: A  Little Help needed to walk in hospital room?: A Little Help needed climbing 3-5 steps with a railing? : A Lot 6 Click Score: 17    End of Session Equipment Utilized During Treatment: Gait belt Activity Tolerance: Patient limited by fatigue;Patient limited by pain Patient left: in chair;with call bell/phone within reach   PT Visit Diagnosis: Other abnormalities of gait and mobility (R26.89);Pain Pain - Right/Left: Right Pain - part of body: Knee     Time: 3704-8889 PT Time Calculation (min) (ACUTE ONLY): 28 min  Charges:  $Gait Training: 8-22 mins $Therapeutic Exercise: 8-22 mins                         Doreatha Massed, PT Acute Rehabilitation  Office: [phone] Pager: [phone]

## 2020-12-25 NOTE — Plan of Care (Signed)
  Problem: Education: Goal: Knowledge of General Education information will improve Description Including pain rating scale, medication(s)/side effects and non-pharmacologic comfort measures Outcome: Progressing   Problem: Activity: Goal: Risk for activity intolerance will decrease Outcome: Progressing   Problem: Safety: Goal: Ability to remain free from injury will improve Outcome: Progressing   

## 2020-12-25 NOTE — Plan of Care (Signed)

## 2020-12-25 NOTE — Progress Notes (Signed)
  Subjective: [name] is a 60 y.o. female s/p right TKA.  They are POD 2.  Pt's pain is controlled but moderate; improved compared with yesterday for sure.   Pt has ambulated with some difficulty.  She denies any chest pain but she does note shortness of breath when she does not use oxygen.  She feels she becomes short of breath when she ambulates.  This is been going on since she woke up from surgery.  She does have history of PE but she is currently on Xarelto.  She states that this does not feel how she felt with her PE as her shortness of breath was much worse at that time and she had associated chest pain.  Objective: Vital signs in last 24 hours: Temp:  [97.7 F (36.5 C)-98.3 F (36.8 C)] 98.3 F (36.8 C) (11/20 0518) Pulse Rate:  [58-69] 69 (11/20 0518) Resp:  [17-18] 17 (11/20 0518) BP: (122-158)/(60-77) 158/77 (11/20 0518) SpO2:  [95 %-99 %] 99 % (11/20 0518)  Intake/Output from previous day: 11/19 0701 - 11/20 0700 In: 652.2 [P.O.:600; I.V.:52.2] Out: 200 [Urine:200] Intake/Output this shift: Total I/O In: 120 [P.O.:120] Out: -   Exam:  No gross blood or drainage overlying the dressing 2+ DP pulse Sensation intact distally in the right foot Able to dorsiflex and plantarflex the right foot No calf tenderness bilaterally   Labs: Recent Labs    12/24/20 0329  HGB 11.2*   Recent Labs    12/24/20 0329  WBC 6.0  RBC 4.56  HCT 37.0  PLT 177   Recent Labs    12/24/20 0329  NA 137  K 4.2  CL 107  CO2 24  BUN 8  CREATININE 0.71  GLUCOSE 153*  CALCIUM 8.2*   No results for input(s): LABPT, INR in the last 72 hours.  Assessment/Plan: Pt is POD 2 s/p right TKA.    -Plan to discharge to home in coming days pending patient's pain and PT eval  -WBAT with a walker  -Patient's pain is improved compared with yesterday.  She does have some persistent shortness of breath when she goes without oxygen.  This is not becoming worse and is not associated with  tachycardia.  She is taking Xarelto since surgery.  Oxygen saturation 99%.  Plan to continue with physical therapy today and see how she does with regards to her oxygen saturation with ambulation.  Encouraged the patient to call to her nurse if she starts to notice chest pain, shortness of breath that is worsening, other new symptoms.  Little concern for PE at this time but will continue to monitor.     [name] 12/25/2020, 12:15 PM

## 2020-12-25 NOTE — Progress Notes (Signed)
Physical Therapy Treatment Patient Details Name: [name] MRN: [id number] DOB: 05-24-1960 Today's Date: 12/25/2020   History of Present Illness Patient is 60 y.o. female s/p Rt TKA on 12/23/20 with PMH significant for anemia, OA, Afib, CHF, DVT, GERD, HLD, PVD, PE, Lt TKA in 2018.    PT Comments    Pt up with NT to bsc. Pt agreeable to working with PT despite not being pre-medicated prior to session. Limited ambulation distance 2* pain. Pt reported she requested pain meds before my arrival but had not received them. Will continue to follow and progress activity as tolerated.     Recommendations for follow up therapy are one component of a multi-disciplinary discharge planning process, led by the attending physician.  Recommendations may be updated based on patient status, additional functional criteria and insurance authorization.  Follow Up Recommendations  Follow physician's recommendations for discharge plan and follow up therapies     Assistance Recommended at Discharge Frequent or constant Supervision/Assistance  Equipment Recommendations  Rolling walker (2 wheels) (wide width would be ideal)    Recommendations for Other Services       Precautions / Restrictions Precautions Precautions: Fall Restrictions Weight Bearing Restrictions: No Other Position/Activity Restrictions: WBAT     Mobility  Bed Mobility Overal bed mobility: Needs Assistance Bed Mobility: Sit to Supine     Supine to sit: Min assist;HOB elevated Sit to supine: Mod assist;HOB elevated   General bed mobility comments: Assist for bil LEs. Increased time. Pt insisted on having HOB elevated    Transfers Overall transfer level: Needs assistance Equipment used: Rolling walker (2 wheels) Transfers: Sit to/from Stand Sit to Stand: Min assist           General transfer comment: Increased time. Cues for safety, technique, hand placement.    Ambulation/Gait Ambulation/Gait assistance: Min  assist Gait Distance (Feet): 30 Feet Assistive device: Rolling walker (2 wheels) Gait Pattern/deviations: Step-to pattern;Trunk flexed;Antalgic       General Gait Details: Intermittent assist to steady. Ambulation distance limited by pain (pt was not pre-medicated prior to this session)   Stairs             Wheelchair Mobility    Modified Rankin (Stroke Patients Only)       Balance Overall balance assessment: Needs assistance         Standing balance support: Bilateral upper extremity supported;Reliant on assistive device for balance Standing balance-Leahy Scale: Poor                              Cognition Arousal/Alertness: Awake/alert Behavior During Therapy: WFL for tasks assessed/performed Overall Cognitive Status: Within Functional Limits for tasks assessed                                 General Comments: a bit drowsy        Exercises Total Joint Exercises Ankle Circles/Pumps: AROM;Both;10 reps Quad Sets: AROM;Right;10 reps Heel Slides: AAROM;Right;10 reps Hip ABduction/ADduction: AAROM;Right;10 reps Straight Leg Raises: AAROM;Right;10 reps Goniometric ROM: ~10-45 degrees    General Comments        Pertinent Vitals/Pain Pain Assessment: 0-10 Pain Score: 8  Pain Location: R knee Pain Descriptors / Indicators: Discomfort;Sore Pain Intervention(s): Limited activity within patient's tolerance;Monitored during session;Ice applied;Repositioned    Home Living  Prior Function            PT Goals (current goals can now be found in the care plan section) Progress towards PT goals: Progressing toward goals    Frequency    7X/week      PT Plan Current plan remains appropriate    Co-evaluation              AM-PAC PT "6 Clicks" Mobility   Outcome Measure  Help needed turning from your back to your side while in a flat bed without using bedrails?: A Little Help needed  moving from lying on your back to sitting on the side of a flat bed without using bedrails?: A Little Help needed moving to and from a bed to a chair (including a wheelchair)?: A Little Help needed standing up from a chair using your arms (e.g., wheelchair or bedside chair)?: A Little Help needed to walk in hospital room?: A Little Help needed climbing 3-5 steps with a railing? : A Lot 6 Click Score: 17    End of Session Equipment Utilized During Treatment: Gait belt Activity Tolerance: Patient limited by fatigue;Patient limited by pain Patient left: in chair;with call bell/phone within reach   PT Visit Diagnosis: Other abnormalities of gait and mobility (R26.89);Pain Pain - Right/Left: Right Pain - part of body: Knee     Time: 2778-2423 PT Time Calculation (min) (ACUTE ONLY): 19 min  Charges:  $Gait Training: 8-22 mins $Therapeutic Exercise: 8-22 mins                        Doreatha Massed, PT Acute Rehabilitation  Office: [phone] Pager: [phone]

## 2020-12-26 ENCOUNTER — Encounter (HOSPITAL_COMMUNITY): Payer: Self-pay | Admitting: Orthopaedic Surgery

## 2020-12-26 DIAGNOSIS — I480 Paroxysmal atrial fibrillation: Secondary | ICD-10-CM

## 2020-12-26 LAB — CBC
HCT: 38.4 % (ref 36.0–46.0)
Hemoglobin: 11.5 g/dL — ABNORMAL LOW (ref 12.0–15.0)
MCH: 24.5 pg — ABNORMAL LOW (ref 26.0–34.0)
MCHC: 29.9 g/dL — ABNORMAL LOW (ref 30.0–36.0)
MCV: 81.7 fL (ref 80.0–100.0)
Platelets: 199 10*3/uL (ref 150–400)
RBC: 4.7 MIL/uL (ref 3.87–5.11)
RDW: 17.4 % — ABNORMAL HIGH (ref 11.5–15.5)
WBC: 7.4 10*3/uL (ref 4.0–10.5)
nRBC: 0 % (ref 0.0–0.2)

## 2020-12-26 MED ORDER — CHLORHEXIDINE GLUCONATE CLOTH 2 % EX PADS
6.0000 | MEDICATED_PAD | Freq: Every day | CUTANEOUS | Status: DC
  Administered 2020-12-26: 6 via TOPICAL

## 2020-12-26 MED ORDER — METOPROLOL TARTRATE 5 MG/5ML IV SOLN
INTRAVENOUS | Status: AC
  Administered 2020-12-26: 5 mg via INTRAVENOUS
  Filled 2020-12-26: qty 5

## 2020-12-26 MED ORDER — DILTIAZEM HCL-DEXTROSE 125-5 MG/125ML-% IV SOLN (PREMIX)
5.0000 mg/h | INTRAVENOUS | Status: DC
  Administered 2020-12-26 (×2): 5 mg/h via INTRAVENOUS
  Filled 2020-12-26: qty 125

## 2020-12-26 MED ORDER — DILTIAZEM LOAD VIA INFUSION
5.0000 mg | Freq: Once | INTRAVENOUS | Status: AC
  Administered 2020-12-26: 5 mg via INTRAVENOUS
  Filled 2020-12-26: qty 5

## 2020-12-26 MED ORDER — METOPROLOL TARTRATE 5 MG/5ML IV SOLN: 5.0000 mg | Freq: Once | INTRAVENOUS | Status: AC

## 2020-12-26 NOTE — TOC Transition Note (Signed)
Transition of Care [facility]) - CM/SW Discharge Note  Patient Details  Name: [name] MRN: [id number] Date of Birth: Aug 22, 1960  [facility] CM/SW Contact:  [name], LCSW Phone Number: 12/26/2020, 11:55 AM  Clinical Narrative: Patient is expected to discharge home after working with PT. CSW met with patient to confirm discharge plan. Patient will discharge home with HHPT and then transition to [place]. Patient cannot remember who will provide HH. Patient has a 3N1 and walker without wheels that she private paid for, but will need a rolling walker. CSW made DME referral to [facility] with [employer]. [employer] to deliver bariatric walker to patient's room. CSW confirmed with Stacie from [facility] that patient is set up for HHPT. CSW updated husband. TOC signing off.  Final next level of care: [place] Barriers to Discharge: No Barriers Identified  Patient Goals and CMS Choice Patient states their goals for this hospitalization and ongoing recovery are:: Discharge home with [place] CMS Medicare.gov Compare Post Acute Care list provided to:: Patient Choice offered to / list presented to : Patient  Discharge Plan and Services         DME Arranged: [place] wide DME Agency: [employer] Date DME Agency Contacted: 12/26/20 Time DME Agency Contacted: [phone] Representative spoke with at DME Agency: [place]: PT [place]: [place] Date [place]: 12/26/20 Representative spoke with at [place]: [place]  Readmission Risk Interventions No flowsheet data found.

## 2020-12-26 NOTE — Progress Notes (Signed)
Patient ID: [name], female   DOB: 14-Sep-1960, 60 y.o.   MRN: [id number] I did go see the patient during lunchtime.  She does have a chronic history of atrial fibrillation and is on Xarelto.  She seems to be in A. fib with RVR right now with a heart rate running anywhere from the low 100s to 160.  Dr. Katharina Caper is her cardiologist.  I will have cardiology see her as a consultation.

## 2020-12-26 NOTE — Progress Notes (Signed)
Patient ID: [name], female   DOB: 07-Jun-1960, 60 y.o.   MRN: [id number] She does still report some lightheadedness when she is up.  However her vital signs are stable and her blood pressure is actually a little high.  I think this may be just her dealing with the pain medications and tiredness in general.  She is slowly making some progress with mobility.  I did change her right knee dressing and her incision is dry and clean.  We talked about the possibility of going home later today but that all depends on her mobility with therapy.  She may need to stay 1 more day.

## 2020-12-26 NOTE — Progress Notes (Signed)
   12/26/20 1358  Assess: MEWS Score  Temp (!) 101.1 F (38.4 C)  BP 122/76  Pulse Rate (!) 159  Resp 16  Level of Consciousness Alert  SpO2 95 %  O2 Device Nasal Cannula  Patient Activity (if Appropriate) In bed  Assess: MEWS Score  MEWS Temp 1  MEWS Systolic 0  MEWS Pulse 3  MEWS RR 0  MEWS LOC 0  MEWS Score 4  MEWS Score Color Red  Assess: if the MEWS score is Yellow or Red  Were vital signs taken at a resting state? Yes  Focused Assessment No change from prior assessment  Does the patient meet 2 or more of the SIRS criteria? No  MEWS guidelines implemented *See Row Information* Yes  Take Vital Signs  Increase Vital Sign Frequency  Red: Q 1hr X 4 then Q 4hr X 4, if remains red, continue Q 4hrs  Escalate  MEWS: Escalate Red: discuss with charge nurse/RN and provider, consider discussing with RRT  Notify: Charge Nurse/RN  Name of Charge Nurse/RN Notified Danna, RN  Date Charge Nurse/RN Notified 12/26/20  Time Charge Nurse/RN Notified 1400  Notify: Provider  Provider Name/Title Dr. Ninfa Linden  Date Provider Notified 12/26/20  Time Provider Notified 1400  Notification Type Call  Notification Reason Other (Comment) (Pt now on red mews d/t HR)  Provider response Other (Comment) (waiting on cardiologist)  Date of Provider Response 12/26/20  Time of Provider Response 1400  Notify: Rapid Response  Name of Rapid Response RN Notified Sarah, RN  Date Rapid Response Notified 12/26/20  Time Rapid Response Notified 1400  Document  Patient Outcome Transferred/level of care increased  Progress note created (see row info) Yes  Assess: SIRS CRITERIA  SIRS Temperature  1  SIRS Pulse 1  SIRS Respirations  0  SIRS WBC 0  SIRS Score Sum  2   Dr. Ninfa Linden, Charge RN, Rapid response RN notified. Pt transferred to higher level of care.

## 2020-12-26 NOTE — Progress Notes (Addendum)
   12/26/20 1202  Assess: MEWS Score  Temp (!) 101.1 F (38.4 C)  BP (!) 148/83  Pulse Rate (!) 123  Resp 18  Level of Consciousness Alert  SpO2 95 %  O2 Device Room Air  Patient Activity (if Appropriate) In bed  Assess: MEWS Score  MEWS Temp 1  MEWS Systolic 0  MEWS Pulse 2  MEWS RR 0  MEWS LOC 0  MEWS Score 3  MEWS Score Color Yellow  Assess: if the MEWS score is Yellow or Red  Were vital signs taken at a resting state? Yes  Focused Assessment No change from prior assessment  Does the patient meet 2 or more of the SIRS criteria? No  MEWS guidelines implemented *See Row Information* Yes  Treat  Pain Scale 0-10  Pain Score 5  Pain Type Surgical pain  Pain Location Knee  Pain Orientation Right  Pain Descriptors / Indicators Aching  Pain Frequency Intermittent  Pain Onset On-going  Pain Intervention(s) Rest  Take Vital Signs  Increase Vital Sign Frequency  Yellow: Q 2hr X 2 then Q 4hr X 2, if remains yellow, continue Q 4hrs  Escalate  MEWS: Escalate Yellow: discuss with charge nurse/RN and consider discussing with provider and RRT  Notify: Charge Nurse/RN  Name of Charge Nurse/RN Notified Danna, RN  Date Charge Nurse/RN Notified 12/26/20  Time Charge Nurse/RN Notified 1205  Notify: Provider  Provider Name/Title Dr. Kathrynn Speed  Date Provider Notified 12/26/20  Time Provider Notified 1205  Notification Type Face-to-face  Notification Reason Other (Comment) (elevated HR)  Provider response At bedside;See new orders  Date of Provider Response 12/26/20  Time of Provider Response 1205  Notify: Rapid Response  Name of Rapid Response RN Notified n/a  Document  Patient Outcome Other (Comment) (Pt denies SOB or chest pain, continues to have elevated HR. EKG and CBC done per MD's order. Per Dr. Ninfa Linden, cardiologist will be consulted. Pt and husband at bedside updated.)  Progress note created (see row info) Yes  Assess: SIRS CRITERIA  SIRS Temperature  1  SIRS Pulse 1   SIRS Respirations  0  SIRS WBC 0  SIRS Score Sum  2   Pt continues to have elevated HR. Pt in bed denying chest pain and SOB but complaining of 5/10 pain to R knee, not in acute distress. Dr. Ninfa Linden notified, received and carried out new orders. Charge RN made aware, husband at bedside updated.  Per Dr. Ninfa Linden, Pt is for cardiology consult. RN will continue to monitor Pt.

## 2020-12-26 NOTE — Progress Notes (Signed)
Patient ID: [name], female   DOB: [birth year], 60 y.o.   MRN: [id number] I really appreciate Dr. Jacalyn Lefevre (Cardiology) consultation.

## 2020-12-26 NOTE — Consult Note (Signed)
Error [name]   

## 2020-12-26 NOTE — Plan of Care (Signed)

## 2020-12-26 NOTE — Consult Note (Signed)
Cardiology Consultation:   {Patient ID: BABY GIEGER MRN: [id number]; DOB: 12-08-1960  Admit date: 12/23/2020 Date of Consult: 12/26/2020  PCP:  Mateo Flow, MD   [facility] Providers Cardiologist:  Mertie Moores, MD     Patient Profile:   [name] is a 60 y.o. female with a hx of paroxysmal atrial fibrillation, pulmonary embolus and now status post knee replacement who is being seen 12/26/2020 for the evaluation of atrial fibrillation at the request of Jean Rosenthal, MD.  History of Present Illness:   Last echocardiogram June 2020 showed normal LV function.  Patient underwent right total knee arthroplasty on November 19.  She has developed atrial fibrillation postoperatively and cardiology asked to evaluate.  She denies dyspnea, chest pain or syncope.  She has mild palpitations.   Past Medical History:  Diagnosis Date   Anemia    Arthritis    Atrial fibrillation ([place])    Bleeding ulcer 12-2010  hospitalized for 1 week   Blood transfusion    Blood transfusion without reported diagnosis    2011-2 units transfused, bleeding ulcer   Bruises easily    CHF (congestive heart failure) ([place])    with PE resolved after    Complication of anesthesia    Constipation    Difficulty in swallowing 03/24/2013   no longer a problem   Diverticulitis    DVT (deep venous thrombosis) ([place]) 06/2013   right leg   Dysrhythmia     Hx of A fib    GERD (gastroesophageal reflux disease)    Hyperlipidemia    Leg swelling 03/24/2013   not a problem any longer   Osteoarthritis    Peripheral vascular disease ([place])    Pre-diabetes    Pulmonary embolus ([place]) 06/2013   Reflux    Shortness of breath    with PE   Vomiting 03/24/2013   problem resolved after lap band revision      Past Surgical History:  Procedure Laterality Date   APPENDECTOMY     CARPEL TUNNEL BILATEREAL     CHOLECYSTECTOMY     5'12   COLONOSCOPY N/A 04/10/2013   Procedure: COLONOSCOPY;   Surgeon: Beryle Beams, MD;  Location: [facility];  Service: Endoscopy;  Laterality: N/A;   DENTAL SURGERY  10/2019   tooth implant   DIAGNOSTIC LAPAROSCOPY     ENDOVENOUS ABLATION SAPHENOUS VEIN W/ LASER  06/2008   ENDOVENOUS ABLATION SAPHENOUS VEIN W/ LASER Left 12/09/2013   EVLA LEFT GREATER SAPHENOUS VEIN BY TODD EARLY MD   ESOPHAGOGASTRODUODENOSCOPY  12/18/2010   Procedure: ESOPHAGOGASTRODUODENOSCOPY (EGD);  Surgeon: Gatha Mayer, MD;  Location: [facility];  Service: Endoscopy;  Laterality: N/A;   JOINT REPLACEMENT Left    knee   LAPAROSCOPIC GASTRIC BANDING  01/05/2008   LAPAROSCOPIC GASTRIC BANDING  12/18/2010   01/04/2009   SALPHINGOOOPHORECTOMY Right    cyst removal   TONSILLECTOMY     TOTAL KNEE ARTHROPLASTY Left 07/31/2016   Procedure: LEFT TOTAL KNEE ARTHROPLASTY;  Surgeon: [name], MD;  Location: [place];  Service: Orthopedics;  Laterality: Left;   WEDGE RESECTION OF OVARY  LEFT OVARY     Inpatient Medications: Scheduled Meds:  diltiazem  5 mg Intravenous Once   docusate sodium  100 mg Oral BID   fesoterodine  4 mg Oral Daily   hydroxychloroquine  200 mg Oral BID   metoprolol succinate  25 mg Oral Daily   multivitamin with minerals  1 tablet Oral Daily  nitrofurantoin (macrocrystal-monohydrate)  100 mg Oral Daily   pantoprazole  40 mg Oral Daily   rivaroxaban  20 mg Oral Q lunch   Continuous Infusions:  sodium chloride 10 mL/hr at 12/24/20 0700   diltiazem (CARDIZEM) infusion     methocarbamol (ROBAXIN) IV     PRN Meds: acetaminophen, alum & mag hydroxide-simeth, diphenhydrAMINE, HYDROmorphone (DILAUDID) injection, HYDROmorphone, menthol-cetylpyridinium **OR** phenol, methocarbamol **OR** methocarbamol (ROBAXIN) IV, metoCLOPramide **OR** metoCLOPramide (REGLAN) injection, ondansetron **OR** ondansetron (ZOFRAN) IV, oxyCODONE, oxyCODONE, polyethylene glycol  Allergies:    Allergies  Allergen Reactions   Contrast Media [Iodinated  Diagnostic Agents] Shortness Of Breath and Other (See Comments)    APNEA   Penicillins Itching and Rash    PATIENT HAD A PCN REACTION WITH IMMEDIATE RASH, FACIAL/TONGUE/THROAT SWELLING, SOB, OR LIGHTHEADEDNESS WITH HYPOTENSION:  #  #  #  YES  #  #  #   Has patient had a PCN reaction causing severe rash involving mucus membranes or skin necrosis: NO Has patient had a PCN reaction that required hospitalization NO Has patient had a PCN reaction occurring within the last 10 years: NO   Ancef [Cefazolin Sodium] Nausea And Vomiting    Tolerates IV    Social History:   Social History   Socioeconomic History   Marital status: Married    Spouse name: Not on file   Number of children: Not on file   Years of education: Not on file   Highest education level: Not on file  Occupational History   Occupation: Programmer, multimedia: [employer]  Tobacco Use   Smoking status: Never   Smokeless tobacco: Never  Vaping Use   Vaping Use: Never used  Substance and Sexual Activity   Alcohol use: No    Alcohol/week: 0.0 standard drinks   Drug use: No   Sexual activity: Yes    Partners: Male    Birth control/protection: None  Other Topics Concern   Not on file  Social History Narrative   Not on file   Social Determinants of Health   Financial Resource Strain: Not on file  Food Insecurity: Not on file  Transportation Needs: Not on file  Physical Activity: Not on file  Stress: Not on file  Social Connections: Not on file  Intimate Partner Violence: Not on file     Family History:      Family History  Problem Relation Age of Onset   Heart disease Father    Kidney disease Father    Peripheral vascular disease Father    Heart attack Father        48   Diabetes Mother    Arthritis Mother     ROS:  Please see the history of present illness.  Knee pain. All other ROS reviewed and negative.     Physical Exam/Data:   Vitals:   12/26/20 1358 12/26/20 1409 12/26/20 1416 12/26/20 1431   BP: 122/76 109/72 123/83 106/68  Pulse: (!) 159 (!) 170 (!) 155 (!) 169  Resp: 16 (!) 23 (!) 23 (!) 23  Temp: (!) 101.1 F (38.4 C)     TempSrc: Oral     SpO2: 95% 95% 96%   Weight:      Height:        Intake/Output Summary (Last 24 hours) at 12/26/2020 1455 Last data filed at 12/26/2020 1229 Gross per 24 hour  Intake 780 ml  Output 700 ml  Net 80 ml   Last 3 Weights 12/23/2020 12/16/2020 10/28/2020  Weight (lbs) 314 lb 6.4 oz 314 lb 6.4 oz 334 lb 9.6 oz  Weight (kg) 142.611 kg 142.611 kg 151.774 kg  Some encounter information is confidential and restricted. Go to Review Flowsheets activity to see all data.       Body mass index is 50.75 kg/m. General:  Well nourished, well developed, in no acute distress HEENT: normal Neck: no JVD Vascular: No carotid bruits; Distal pulses 2+ bilaterally Cardiac:  normal S1, S2; Irregular and tachycardic, no murmur Lungs:  clear to auscultation bilaterally, no wheezing, rhonchi or rales  Abd: soft, nontender, no hepatomegaly  Ext: no edema; s/p right TKA Musculoskeletal:  No deformities, BUE and BLE strength normal and equal Skin: warm and dry  Neuro:  CNs 2-12 intact, no focal abnormalities noted Psych:  Normal affect   EKG:  The EKG was personally reviewed and demonstrates: Atrial fibrillation with rapid ventricular response, incomplete right bundle branch block, nonspecific ST changes.  Laboratory Data:  Chemistry   Recent Labs  Lab 12/24/20 0329  NA 137  K 4.2  CL 107  CO2 24  GLUCOSE 153*  BUN 8  CREATININE 0.71  CALCIUM 8.2*  GFRNONAA >60  ANIONGAP 6    Hematology Recent Labs  Lab 12/24/20 0329 12/26/20 1208  WBC 6.0 7.4  RBC 4.56 4.70  HGB 11.2* 11.5*  HCT 37.0 38.4  MCV 81.1 81.7  MCH 24.6* 24.5*  MCHC 30.3 29.9*  RDW 17.0* 17.4*  PLT 177 199     Radiology/Studies:  DG Knee Right Port  Result Date: 12/23/2020 CLINICAL DATA:  Right total knee replacement. EXAM: PORTABLE RIGHT KNEE - 1-2 VIEW  COMPARISON:  Right knee x-rays dated February 26, 2019. FINDINGS: The right knee demonstrates a total knee arthroplasty without evidence of hardware failure or complication. There is expected intra-articular air. There is no fracture or dislocation. The alignment is anatomic. Post-surgical changes noted in the surrounding soft tissues. IMPRESSION: 1. Right total knee arthroplasty without evidence of acute postoperative complication. Electronically Signed   By: Titus Dubin M.D.   On: 12/23/2020 14:56     Assessment and Plan:   Postoperative atrial fibrillation-patient has developed atrial fibrillation.  Would continue home dose of metoprolol.  We will add of IV Cardizem.  CHA2DS2-VASc is 2 for peripheral vascular disease and female sex.  Continue xarelto. If she does not convert may need to proceed with DCCV. Status post total knee arthroplasty-Per orthopedic surgery. H/O pulmonary embolus-continue xarelto.   For questions or updates, please contact [place] Please consult [email or web address] for contact info under    Signed,  Kirk Ruths, MD  12/26/2020

## 2020-12-26 NOTE — Progress Notes (Signed)
PT Cancellation Note  Patient Details Name: PRESLY STEINRUCK MRN: [id number] DOB: April 30, 1960   Cancelled Treatment:    Reason Eval/Treat Not Completed: Medical issues which prohibited therapy--elevated HR-cardiology consult. Will hold PT for now and await cardiology recs. At this time, planning to resume PT on tomorrow    Doreatha Massed, PT Acute Rehabilitation  Office: [phone] Pager: [phone]

## 2020-12-27 MED ORDER — CHLORHEXIDINE GLUCONATE 0.12 % MT SOLN
15.0000 mL | Freq: Two times a day (BID) | OROMUCOSAL | Status: DC
  Administered 2020-12-27 – 2020-12-31 (×10): 15 mL via OROMUCOSAL
  Filled 2020-12-27 (×10): qty 15

## 2020-12-27 MED ORDER — METOPROLOL SUCCINATE ER 50 MG PO TB24
50.0000 mg | ORAL_TABLET | Freq: Every day | ORAL | Status: DC
  Administered 2020-12-28 – 2020-12-31 (×4): 50 mg via ORAL
  Filled 2020-12-27 (×4): qty 1

## 2020-12-27 MED ORDER — ORAL CARE MOUTH RINSE
15.0000 mL | Freq: Two times a day (BID) | OROMUCOSAL | Status: DC
  Administered 2020-12-27 – 2020-12-31 (×7): 15 mL via OROMUCOSAL

## 2020-12-27 MED ORDER — CHLORHEXIDINE GLUCONATE CLOTH 2 % EX PADS
6.0000 | MEDICATED_PAD | Freq: Every day | CUTANEOUS | Status: DC
  Administered 2020-12-27 – 2020-12-31 (×4): 6 via TOPICAL

## 2020-12-27 NOTE — Progress Notes (Signed)
Progress Note  Patient Name: [name] Date of Encounter: 12/27/2020  [facility] Cardiologist: Mertie Moores, MD   Subjective   No CP or dyspnea  Inpatient Medications    Scheduled Meds:  chlorhexidine  15 mL Mouth Rinse BID   Chlorhexidine Gluconate Cloth  6 each Topical Daily   docusate sodium  100 mg Oral BID   fesoterodine  4 mg Oral Daily   hydroxychloroquine  200 mg Oral BID   mouth rinse  15 mL Mouth Rinse q12n4p   metoprolol succinate  25 mg Oral Daily   multivitamin with minerals  1 tablet Oral Daily   nitrofurantoin (macrocrystal-monohydrate)  100 mg Oral Daily   pantoprazole  40 mg Oral Daily   rivaroxaban  20 mg Oral Q lunch   Continuous Infusions:  sodium chloride 10 mL/hr at 12/24/20 0700   diltiazem (CARDIZEM) infusion 10 mg/hr (12/26/20 2019)   methocarbamol (ROBAXIN) IV     PRN Meds: acetaminophen, alum & mag hydroxide-simeth, diphenhydrAMINE, HYDROmorphone (DILAUDID) injection, HYDROmorphone, menthol-cetylpyridinium **OR** phenol, methocarbamol **OR** methocarbamol (ROBAXIN) IV, metoCLOPramide **OR** metoCLOPramide (REGLAN) injection, ondansetron **OR** ondansetron (ZOFRAN) IV, oxyCODONE, oxyCODONE, polyethylene glycol   Vital Signs    Vitals:   12/27/20 0700 12/27/20 0707 12/27/20 0800 12/27/20 0900  BP: (!) 141/60  (!) 125/51   Pulse: 75  80 80  Resp: 16  16 14   Temp:      TempSrc:  Oral    SpO2: 92%  91% 96%  Weight:      Height:        Intake/Output Summary (Last 24 hours) at 12/27/2020 0926 Last data filed at 12/27/2020 0600 Gross per 24 hour  Intake 892.9 ml  Output 1250 ml  Net -357.1 ml   Last 3 Weights 12/23/2020 12/16/2020 10/28/2020  Weight (lbs) 314 lb 6.4 oz 314 lb 6.4 oz 334 lb 9.6 oz  Weight (kg) 142.611 kg 142.611 kg 151.774 kg  Some encounter information is confidential and restricted. Go to Review Flowsheets activity to see all data.      Telemetry    Atrial fibrillation converting to sinus - Personally  Reviewed  Physical Exam   GEN: No acute distress.   Neck: No JVD Cardiac: RRR, no murmurs, rubs, or gallops.  Respiratory: Clear to auscultation bilaterally. GI: Soft, nontender, non-distended  MS: No edema; s/p TKA Neuro:  Nonfocal  Psych: Normal affect   Labs    Chemistry Recent Labs  Lab 12/24/20 0329  NA 137  K 4.2  CL 107  CO2 24  GLUCOSE 153*  BUN 8  CREATININE 0.71  CALCIUM 8.2*  GFRNONAA >60  ANIONGAP 6     Hematology Recent Labs  Lab 12/24/20 0329 12/26/20 1208  WBC 6.0 7.4  RBC 4.56 4.70  HGB 11.2* 11.5*  HCT 37.0 38.4  MCV 81.1 81.7  MCH 24.6* 24.5*  MCHC 30.3 29.9*  RDW 17.0* 17.4*  PLT 177 199    Patient Profile     60 y.o. female with past medical history of pulmonary embolus, paroxysmal atrial fibrillation and status post knee replacement for evaluation of postoperative atrial fibrillation.  Last echocardiogram June 2020 showed normal LV function.  Assessment & Plan    1 Postoperative atrial fibrillation-patient has converted to sinus rhythm.  We will discontinue IV Cardizem.  Increase Toprol to 50 mg daily.  Continue Xarelto.  2 status post total knee arthroplasty-Per orthopedic surgery.  3 history of pulmonary embolus-plan to continue Xarelto at discharge.  Cardiology will  sign off.  Please call with questions.  We will arrange follow-up with Dr. Acie Fredrickson in 4 to 6 weeks.  For questions or updates, please contact [place] Please consult [email or web address] for contact info under        Signed, Kirk Ruths, MD  12/27/2020, 9:26 AM

## 2020-12-27 NOTE — Progress Notes (Signed)
Physical Therapy Treatment Patient Details Name: [name] MRN: [id number] DOB: 04-Apr-1960 Today's Date: 12/27/2020   History of Present Illness Patient is 60 y.o. female s/p Rt TKA on 12/23/20 with PMH significant for anemia, OA, Afib, CHF, DVT, GERD, HLD, PVD, PE, Lt TKA in 2018. Onset afib////RVR.    PT Comments    Patient in recliner, given medication . With +3 assisting, Patient unable to stand from recliner. Patient did not want assistance to pull to stand yet patient unable to push self up.  Patient stated  each time" wait a minute" Offered use of maxisky lift to get back to bed. Patient asking to allow pain meds to kick in. Will attempt  in 1 hour, use maxisky if unable to stand.   Recommendations for follow up therapy are one component of a multi-disciplinary discharge planning process, led by the attending physician.  Recommendations may be updated based on patient status, additional functional criteria and insurance authorization.  Follow Up Recommendations  Home health PT     Assistance Recommended at Discharge Frequent or constant Supervision/Assistance  Equipment Recommendations  Rolling walker (2 wheels)    Recommendations for Other Services       Precautions / Restrictions Precautions Precautions: Fall;Knee Required Braces or Orthoses: Knee Immobilizer - Right Knee Immobilizer - Right: On when out of bed or walking Restrictions RLE Weight Bearing: Weight bearing as tolerated     Mobility  Bed Mobility Overal bed mobility: Needs Assistance Bed Mobility: Supine to Sit     Supine to sit: Mod assist;HOB elevated     General bed mobility comments: in recliner.    Transfers Overall transfer level: Needs assistance Equipment used: Rolling walker (2 wheels) Transfers: Sit to/from Stand Sit to Stand: Max assist;+2 physical assistance;+2 safety/equipment     Step pivot transfers: Max assist;+2 physical assistance;+2 safety/equipment     General  transfer comment: with + 3 available and multiple attempts to stnad, patient  unable. Each time prepared with a 123 count, patient would stop and say, "no' don't push me. " this occured over a 2o minute period and did not stnad.    Ambulation/Gait               General Gait Details: unable   Stairs             Wheelchair Mobility    Modified Rankin (Stroke Patients Only)       Balance Overall balance assessment: Needs assistance Sitting-balance support: Bilateral upper extremity supported;Feet supported Sitting balance-Leahy Scale: Fair     Standing balance support: During functional activity;Bilateral upper extremity supported;Reliant on assistive device for balance Standing balance-Leahy Scale: Poor                              Cognition Arousal/Alertness: Lethargic Behavior During Therapy: Anxious Overall Cognitive Status: Within Functional Limits for tasks assessed                                 General Comments: patient requiring frequent encouragement to stay on task and put effort to stnad.        Exercises      General Comments        Pertinent Vitals/Pain Pain Score: 8  Pain Location: R knee Pain Descriptors / Indicators: Discomfort;Sore Pain Intervention(s): RN gave pain meds during session;Monitored during session    Home  Living                          Prior Function            PT Goals (current goals can now be found in the care plan section) Acute Rehab PT Goals Patient Stated Goal: walk PT Goal Formulation: With patient/family Time For Goal Achievement: 01/03/21 Potential to Achieve Goals: Good Progress towards PT goals: Not progressing toward goals - comment (unable to stand from recliner.)    Frequency    7X/week      PT Plan Current plan remains appropriate    Co-evaluation              AM-PAC PT "6 Clicks" Mobility   Outcome Measure  Help needed turning from your  back to your side while in a flat bed without using bedrails?: A Lot Help needed moving from lying on your back to sitting on the side of a flat bed without using bedrails?: A Lot Help needed moving to and from a bed to a chair (including a wheelchair)?: Total Help needed standing up from a chair using your arms (e.g., wheelchair or bedside chair)?: Total Help needed to walk in hospital room?: Total Help needed climbing 3-5 steps with a railing? : Total 6 Click Score: 8    End of Session Equipment Utilized During Treatment: Gait belt Activity Tolerance: Patient limited by fatigue;Patient limited by pain Patient left: in chair;with call bell/phone within reach;with family/visitor present Nurse Communication: Mobility status PT Visit Diagnosis: Other abnormalities of gait and mobility (R26.89);Pain Pain - Right/Left: Right Pain - part of body: Knee     Time: 7322-0254 PT Time Calculation (min) (ACUTE ONLY): 27 min  Charges:  $Therapeutic Activity: 23-37 mins                     [name] PT Acute Rehabilitation Services Pager [phone] Office [phone]    [name] 12/27/2020, 2:45 PM

## 2020-12-27 NOTE — Progress Notes (Addendum)
Physical Therapy Treatment Patient Details Name: [name] MRN: [id number] DOB: 10-16-1960 Today's Date: 12/27/2020   History of Present Illness Patient is 60 y.o. female s/p Rt TKA on 12/23/20 with PMH significant for anemia, OA, Afib, CHF, DVT, GERD, HLD, PVD, PE, Lt TKA in 2018. Onset afib////RVR.    PT Comments    Patient requiring +3 to stand from recliner with much effort.  Patient may benefit from post acute rehab if  progress remains slow. Patient ambulated 68' 3 days ago with min assistance, Not total assistance required by 2-3 persons.  Hopefully patient will progress  after this backslide due to medical issues.  HR up to 115, SPO2 on RA >92%.  Recommendations for follow up therapy are one component of a multi-disciplinary discharge planning process, led by the attending physician.  Recommendations may be updated based on patient status, additional functional criteria and insurance authorization.  Follow Up Recommendations  Skilled nursing-short term rehab (<3 hours/day)     Assistance Recommended at Discharge Frequent or constant Supervision/Assistance  Equipment Recommendations  Rolling walker (2 wheels)    Recommendations for Other Services       Precautions / Restrictions Precautions Precautions: Fall;Knee Precaution Comments: pt requested to remove KI Required Braces or Orthoses: Knee Immobilizer - Right Knee Immobilizer - Right: On when out of bed or walking Restrictions RLE Weight Bearing: Weight bearing as tolerated     Mobility  Bed Mobility Overal bed mobility: Needs Assistance Bed Mobility: Sit to supine.sign       Sit to supine: Total assist;+2 for physical assistance;+2 for safety/equipment   General bed mobility comments: patient unable to assist with legs to place onto bed    Transfers Overall transfer level: Needs assistance Equipment used: Rolling walker (2 wheels) Transfers: Sit to/from Stand Sit to Stand: Max assist (+3)      Step pivot transfers: Max assist;+2 physical assistance;+2 safety/equipment     General transfer comment: with + 3 available and multiple attempts to stand, patient  able to power up with 3 persons assisting. with much extra time, able to take small steps to turn to recliner and back up 2 small steps. cues for hand placement to reach back, Patient able to perform withlout KI    Ambulation/Gait               General Gait Details: unable   Stairs             Wheelchair Mobility    Modified Rankin (Stroke Patients Only)       Balance Overall balance assessment: Needs assistance Sitting-balance support: Bilateral upper extremity supported;Feet supported Sitting balance-Leahy Scale: Fair     Standing balance support: During functional activity;Bilateral upper extremity supported;Reliant on assistive device for balance Standing balance-Leahy Scale: Poor                              Cognition Arousal/Alertness: Awake/alert Behavior During Therapy: Anxious Overall Cognitive Status: Within Functional Limits for tasks assessed                                 General Comments: more alert        Exercises      General Comments        Pertinent Vitals/Pain Pain Score: 7  Pain Location: R knee Pain Descriptors / Indicators: Discomfort;Sore Pain Intervention(s): Premedicated before session  Home Living                          Prior Function            PT Goals (current goals can now be found in the care plan section) Acute Rehab PT Goals Patient Stated Goal: walk PT Goal Formulation: With patient/family Time For Goal Achievement: 01/03/21 Potential to Achieve Goals: Good Progress towards PT goals: Progressing toward goals    Frequency    7X/week      PT Plan Current plan remains appropriate;Discharge plan needs to be updated    Co-evaluation              AM-PAC PT "6 Clicks" Mobility    Outcome Measure  Help needed turning from your back to your side while in a flat bed without using bedrails?: A Lot Help needed moving from lying on your back to sitting on the side of a flat bed without using bedrails?: Total Help needed moving to and from a bed to a chair (including a wheelchair)?: Total Help needed standing up from a chair using your arms (e.g., wheelchair or bedside chair)?: Total Help needed to walk in hospital room?: Total Help needed climbing 3-5 steps with a railing? : Total 6 Click Score: 7    End of Session Equipment Utilized During Treatment: Gait belt Activity Tolerance: Patient limited by fatigue;Patient limited by pain Patient left: in bed;with call bell/phone within reach;with nursing/sitter in room;with family/visitor present Nurse Communication: Mobility status PT Visit Diagnosis: Other abnormalities of gait and mobility (R26.89);Pain Pain - Right/Left: Right Pain - part of body: Knee     Time: 9935-7017 PT Time Calculation (min) (ACUTE ONLY): 25 min  Charges:  $Therapeutic Activity: 23-37 mins                     [name] PT Acute Rehabilitation Services Pager [phone] Office [phone]   [name] 12/27/2020, 3:47 PM

## 2020-12-27 NOTE — Progress Notes (Signed)
Patient ID: [name], female   DOB: 1960-12-29, 60 y.o.   MRN: [id number] Islam looks better overall this morning.  Her heart rate is normal.  She is afebrile.  She did have a temperature yesterday but this is most likely atelectasis because she has been down quite a bit.  I really appreciate cardiology's consult and assistance with the care of this patient.  From my standpoint, she can be out of bed and into a chair and work again with physical therapy for mobility and ambulation.  If she continues to improve hopefully she can be discharged to home in the next day or so.

## 2020-12-27 NOTE — Progress Notes (Addendum)
Physical Therapy Treatment Patient Details Name: [name] MRN: [id number] DOB: [birth year] Today's Date: 12/27/2020   History of Present Illness Patient is 60 y.o. female s/p Rt TKA on 12/23/20 with PMH significant for anemia, OA, Afib, CHF, DVT, GERD, HLD, PVD, PE, Lt TKA in 2018. Onset afib////RVR.    PT Comments    Patient resting in bed, premedicated for pain. Patient drowsy. Patient required mod assistance to move to sitting on bed edge. +2 max assist to stand from bed, slow to take small pivot steps to turn to recliner, min assist to sit with decreased control of descent.  Patient requiring much more assistance today.   Progress from here on may be much  slower.    Recommendations for follow up therapy are one component of a multi-disciplinary discharge planning process, led by the attending physician.  Recommendations may be updated based on patient status, additional functional criteria and insurance authorization.  Follow Up Recommendations  Home health PT     Assistance Recommended at Discharge Frequent or constant Supervision/Assistance  Equipment Recommendations  Rolling walker (2 wheels) (bariatric)    Recommendations for Other Services       Precautions / Restrictions Precautions Precautions: Fall;Knee Required Braces or Orthoses: Knee Immobilizer - Right Restrictions RLE Weight Bearing: Weight bearing as tolerated     Mobility  Bed Mobility Overal bed mobility: Needs Assistance Bed Mobility: Supine to Sit     Supine to sit: Mod assist;HOB elevated     General bed mobility comments: assist with both legs and trunk, assist to scoot to bed edge.    Transfers Overall transfer level: Needs assistance Equipment used: Rolling walker (2 wheels) Transfers: Sit to/from Stand;Bed to chair/wheelchair/BSC Sit to Stand: Max assist;+2 physical assistance;+2 safety/equipment     Step pivot transfers: Max assist;+2 physical assistance;+2 safety/equipment      General transfer comment: Assist to stnad from bed with much extra time to  perform. Slow steps to turn to recliner, very slow.    Ambulation/Gait               General Gait Details: unable   Stairs             Wheelchair Mobility    Modified Rankin (Stroke Patients Only)       Balance Overall balance assessment: Needs assistance Sitting-balance support: Bilateral upper extremity supported;Feet supported Sitting balance-Leahy Scale: Fair     Standing balance support: During functional activity;Bilateral upper extremity supported;Reliant on assistive device for balance Standing balance-Leahy Scale: Poor                              Cognition Arousal/Alertness: Lethargic;Suspect due to medications   Overall Cognitive Status: Within Functional Limits for tasks assessed                                 General Comments: keeps eyes closed most of time.        Exercises      General Comments        Pertinent Vitals/Pain Pain Score: 6  Pain Location: R knee Pain Descriptors / Indicators: Discomfort;Sore Pain Intervention(s): Limited activity within patient's tolerance;Monitored during session;Premedicated before session;Ice applied    Home Living                          Prior Function  PT Goals (current goals can now be found in the care plan section) Acute Rehab PT Goals Patient Stated Goal: walk PT Goal Formulation: With patient/family Time For Goal Achievement: 01/03/21 Potential to Achieve Goals: Good Progress towards PT goals: Not progressing toward goals - comment (has a set back after move to ICU for afib)    Frequency    7X/week      PT Plan Current plan remains appropriate    Co-evaluation              AM-PAC PT "6 Clicks" Mobility   Outcome Measure  Help needed turning from your back to your side while in a flat bed without using bedrails?: A Lot Help needed moving  from lying on your back to sitting on the side of a flat bed without using bedrails?: A Lot Help needed moving to and from a bed to a chair (including a wheelchair)?: Total Help needed standing up from a chair using your arms (e.g., wheelchair or bedside chair)?: Total Help needed to walk in hospital room?: Total Help needed climbing 3-5 steps with a railing? : Total 6 Click Score: 8    End of Session Equipment Utilized During Treatment: Gait belt Activity Tolerance: Patient limited by fatigue;Patient limited by pain Patient left: in chair;with call bell/phone within reach;with family/visitor present Nurse Communication: Mobility status PT Visit Diagnosis: Other abnormalities of gait and mobility (R26.89);Pain Pain - Right/Left: Right Pain - part of body: Knee     Time: 1000-1036 PT Time Calculation (min) (ACUTE ONLY): 36 min  Charges:  $Therapeutic Activity: 23-37 mins                     [name] PT Acute Rehabilitation Services Pager [phone] Office [phone]    [name] 12/27/2020, 2:36 PM

## 2020-12-28 LAB — CBC WITH DIFFERENTIAL/PLATELET
Abs Immature Granulocytes: 0.07 10*3/uL (ref 0.00–0.07)
Basophils Absolute: 0 10*3/uL (ref 0.0–0.1)
Basophils Relative: 1 %
Eosinophils Absolute: 0.3 10*3/uL (ref 0.0–0.5)
Eosinophils Relative: 4 %
HCT: 38.2 % (ref 36.0–46.0)
Hemoglobin: 11 g/dL — ABNORMAL LOW (ref 12.0–15.0)
Immature Granulocytes: 1 %
Lymphocytes Relative: 12 %
Lymphs Abs: 0.8 10*3/uL (ref 0.7–4.0)
MCH: 24.3 pg — ABNORMAL LOW (ref 26.0–34.0)
MCHC: 28.8 g/dL — ABNORMAL LOW (ref 30.0–36.0)
MCV: 84.5 fL (ref 80.0–100.0)
Monocytes Absolute: 1 10*3/uL (ref 0.1–1.0)
Monocytes Relative: 13 %
Neutro Abs: 5.1 10*3/uL (ref 1.7–7.7)
Neutrophils Relative %: 69 %
Platelets: 183 10*3/uL (ref 150–400)
RBC: 4.52 MIL/uL (ref 3.87–5.11)
RDW: 17.3 % — ABNORMAL HIGH (ref 11.5–15.5)
WBC: 7.3 10*3/uL (ref 4.0–10.5)
nRBC: 0 % (ref 0.0–0.2)

## 2020-12-28 LAB — BASIC METABOLIC PANEL
Anion gap: 8 (ref 5–15)
BUN: 10 mg/dL (ref 8–23)
CO2: 26 mmol/L (ref 22–32)
Calcium: 8 mg/dL — ABNORMAL LOW (ref 8.9–10.3)
Chloride: 98 mmol/L (ref 98–111)
Creatinine, Ser: 0.64 mg/dL (ref 0.44–1.00)
GFR, Estimated: >60 mL/min (ref >60)
Glucose, Bld: 88 mg/dL (ref 70–99)
Potassium: 4.1 mmol/L (ref 3.5–5.1)
Sodium: 132 mmol/L — ABNORMAL LOW (ref 135–145)

## 2020-12-28 MED ORDER — LIP MEDEX EX OINT: TOPICAL_OINTMENT | CUTANEOUS | Status: DC | PRN

## 2020-12-28 NOTE — Progress Notes (Signed)
Physical Therapy Treatment Patient Details Name: [name] MRN: [id number] DOB: [birth year] Today's Date: 12/28/2020   History of Present Illness Patient is 60 y.o. female s/p Rt TKA on 12/23/20 with PMH significant for anemia, OA, Afib, CHF, DVT, GERD, HLD, PVD, PE, Lt TKA in 2018. Onset afib////RVR.    PT Comments    Slow improvement noted, continues to require extra time to initiate sit to stand. Mod assist from lower surfaces. Has a lift chair at home.  HR 99 , SPO2 98 on Ra during mobility.  Continue PT.   Instructed pt and spouse on SLR with sheet or belt, heelslides and quad sets.  Recommendations for follow up therapy are one component of a multi-disciplinary discharge planning process, led by the attending physician.  Recommendations may be updated based on patient status, additional functional criteria and insurance authorization.  Follow Up Recommendations  Home health PT vs SNF depending on progress next few days.     Assistance Recommended at Discharge Frequent or constant Supervision/Assistance  Equipment Recommendations  Rolling walker (2 wheels)    Recommendations for Other Services       Precautions / Restrictions Precautions Precautions: Fall;Knee Precaution Comments: did not use KI WBAT     Mobility  Bed Mobility Overal bed mobility: Needs Assistance Bed Mobility: Sit to Supine    Sit to supine: Total assist;+2 for physical assistance;+2 for safety/equipment   General bed mobility comments: patient made effort to lift left leg onto bed, unable, total assist with legs and trunk.    Transfers Overall transfer level: Needs assistance Equipment used: Rolling walker (2 wheels)  Sit to Stand: Mod assist;+2 physical assistance;+2 safety/equipment     Step pivot transfers: Min assist;+2 safety/equipment;+2 physical assistance     General transfer comment: mod assist to stand from recliner, min from higher BSc. Pivot steps  taken using RW.     Ambulation/Gait               General Gait Details: unable   Stairs             Wheelchair Mobility    Modified Rankin (Stroke Patients Only)       Balance Overall balance assessment: Needs assistance Sitting-balance support: Single extremity supported Sitting balance-Leahy Scale: Fair     Standing balance support: During functional activity;Bilateral upper extremity supported;Reliant on assistive device for balance Standing balance-Leahy Scale: Poor                              Cognition Arousal/Alertness: Awake/alert Behavior During Therapy: WFL for tasks assessed/performed Overall Cognitive Status: Within Functional Limits for tasks assessed                                 General Comments: more alert        Exercises   General Comments        Pertinent Vitals/Pain Pain Assessment: Faces Faces Pain Scale: Hurts even more Pain Location: R knee Pain Descriptors / Indicators: Discomfort;Sore Pain Intervention(s): Monitored during session    Home Living Family/patient expects to be discharged to:: Private residence Living Arrangements: Spouse/significant other Available Help at Discharge: Family Type of Home: House Home Access: Stairs to enter Entrance Stairs-Rails: Psychiatric nurse of Steps: 1+1   Home Layout: One level Home Equipment: BSC/3in1;Cane - single point;Standard Environmental consultant;Toilet riser (recliner/lift chair)  Prior Function            PT Goals (current goals can now be found in the care plan section) Progress towards PT goals: Progressing toward goals    Frequency    7X/week      PT Plan Current plan remains appropriate;Discharge plan needs to be updated    Co-evaluation PT/OT/SLP Co-Evaluation/Treatment: Yes Reason for Co-Treatment: Complexity of the patient's impairments (multi-system involvement);To address functional/ADL transfers;For patient/therapist safety PT  goals addressed during session: Mobility/safety with mobility OT goals addressed during session: ADL's and self-care      AM-PAC PT "6 Clicks" Mobility   Outcome Measure  Help needed turning from your back to your side while in a flat bed without using bedrails?: A Lot Help needed moving from lying on your back to sitting on the side of a flat bed without using bedrails?: Total Help needed moving to and from a bed to a chair (including a wheelchair)?: A Lot Help needed standing up from a chair using your arms (e.g., wheelchair or bedside chair)?: A Lot Help needed to walk in hospital room?: Total Help needed climbing 3-5 steps with a railing? : Total 6 Click Score: 9    End of Session Equipment Utilized During Treatment: Gait belt Activity Tolerance: Patient tolerated treatment well Patient left: in bed;with call bell/phone within reach;with family/visitor present Nurse Communication: Mobility status PT Visit Diagnosis: Other abnormalities of gait and mobility (R26.89);Pain Pain - Right/Left: Right Pain - part of body: Knee     Time: 6283-6629 PT Time Calculation (min) (ACUTE ONLY): 33 min  Charges:  $Therapeutic Activity: 23-37 mins                     [name] PT Acute Rehabilitation Services Pager [phone] Office [phone]    [name] 12/28/2020, 4:24 PM

## 2020-12-28 NOTE — Progress Notes (Signed)
Physical Therapy Treatment Patient Details Name: [name] MRN: [id number] DOB: July 18, 1960 Today's Date: 12/28/2020   History of Present Illness Patient is 60 y.o. female s/p Rt TKA on 12/23/20 with PMH significant for anemia, OA, Afib, CHF, DVT, GERD, HLD, PVD, PE, Lt TKA in 2018. Onset afib////RVR.    PT Comments    Patient  demonstrating improved mobility, Less assistance to move to sitting, then pivot steps to [facility] then to recliner. Patient still far from ready to return home.   Recommendations for follow up therapy are one component of a multi-disciplinary discharge planning process, led by the attending physician.  Recommendations may be updated based on patient status, additional functional criteria and insurance authorization.  Follow Up Recommendations  Home health PT     Assistance Recommended at Discharge Frequent or constant Supervision/Assistance  Equipment Recommendations  Rolling walker (2 wheels) (bariatric)    Recommendations for Other Services       Precautions / Restrictions Precautions Precautions: Fall;Knee Precaution Comments: did not use KI Required Braces or Orthoses: Knee Immobilizer - Right Knee Immobilizer - Right: On when out of bed or walking Restrictions Weight Bearing Restrictions: Yes Other Position/Activity Restrictions: WBAT     Mobility  Bed Mobility Overal bed mobility: Needs Assistance Bed Mobility: Supine to Sit     Supine to sit: Mod assist;HOB elevated     General bed mobility comments: assistance with RLE and pivoting of hips    Transfers Overall transfer level: Needs assistance Equipment used: Rolling walker (2 wheels) Transfers: Sit to/from Stand;Bed to chair/wheelchair/BSC Sit to Stand: Min assist;+2 safety/equipment;From elevated surface     Step pivot transfers: Min assist;+2 safety/equipment     General transfer comment: MIn assist x 2 with RW to transfer to BSc and then take steps to recliner. Verbal cues  needed for appropriate technique with walker and to stand erect.much extra time to initiate sit to stand from each surface.    Ambulation/Gait                   Stairs             Wheelchair Mobility    Modified Rankin (Stroke Patients Only)       Balance Overall balance assessment: Needs assistance Sitting-balance support: Single extremity supported Sitting balance-Leahy Scale: Fair     Standing balance support: During functional activity;Bilateral upper extremity supported;Reliant on assistive device for balance Standing balance-Leahy Scale: Poor                              Cognition Arousal/Alertness: Awake/alert Behavior During Therapy: WFL for tasks assessed/performed Overall Cognitive Status: Within Functional Limits for tasks assessed                                 General Comments: more alert        Exercises Total Joint Exercises Quad Sets: AROM;Right;10 reps Long Arc Quad: AAROM;Right;15 reps;Seated    General Comments        Pertinent Vitals/Pain Pain Assessment: Faces Faces Pain Scale: Hurts even more Pain Location: R knee Pain Descriptors / Indicators: Discomfort;Sore Pain Intervention(s): Monitored during session;Ice applied    Home Living Family/patient expects to be discharged to:: Private residence Living Arrangements: Spouse/significant other Available Help at Discharge: Family Type of Home: House Home Access: Stairs to enter Entrance Stairs-Rails: Psychiatric nurse of Steps:  1+1   Home Layout: One level Home Equipment: BSC/3in1;Cane - single point;Standard Walker;Toilet riser (recliner/lift chair)      Prior Function            PT Goals (current goals can now be found in the care plan section) Progress towards PT goals: Progressing toward goals    Frequency           PT Plan Current plan remains appropriate;Discharge plan needs to be updated    Co-evaluation  PT/OT/SLP Co-Evaluation/Treatment: Yes Reason for Co-Treatment: Complexity of the patient's impairments (multi-system involvement);To address functional/ADL transfers;For patient/therapist safety PT goals addressed during session: Mobility/safety with mobility OT goals addressed during session: ADL's and self-care      AM-PAC PT "6 Clicks" Mobility   Outcome Measure  Help needed turning from your back to your side while in a flat bed without using bedrails?: A Lot Help needed moving from lying on your back to sitting on the side of a flat bed without using bedrails?: A Lot Help needed moving to and from a bed to a chair (including a wheelchair)?: A Lot Help needed standing up from a chair using your arms (e.g., wheelchair or bedside chair)?: A Lot Help needed to walk in hospital room?: Total Help needed climbing 3-5 steps with a railing? : Total 6 Click Score: 10    End of Session Equipment Utilized During Treatment: Gait belt Activity Tolerance: Patient tolerated treatment well Patient left: in chair;with call bell/phone within reach;with family/visitor present Nurse Communication: Mobility status PT Visit Diagnosis: Other abnormalities of gait and mobility (R26.89);Pain Pain - Right/Left: Right Pain - part of body: Knee     Time: 1100-1145 PT Time Calculation (min) (ACUTE ONLY): 45 min  Charges:  $Therapeutic Exercise: 8-22 mins $Therapeutic Activity: 8-22 mins                     [name] PT Acute Rehabilitation Services Pager [phone] Office [phone]    [name] 12/28/2020, 4:19 PM

## 2020-12-28 NOTE — Progress Notes (Signed)
Patient ID: [name], female   DOB: September 17, 1960, 60 y.o.   MRN: [id number] There has been no acute changes over the last 24 hours.  Her heart rate remains normal.  Therapy is working with her but there has been slow progress.  She does feel motivated today to try to make more progress.  Her vital signs are stable and her right operative knee is stable.  She can be transferred out of stepdown back to the orthopedic floor (3 [place]) and continue to work with therapy with the goal of discharge to home soon.

## 2020-12-28 NOTE — Evaluation (Signed)
Occupational Therapy Evaluation Patient Details Name: [name] MRN: [id number] DOB: Mar 19, 1960 Today's Date: 12/28/2020   History of Present Illness Patient is 60 y.o. female s/p Rt TKA on 12/23/20 with PMH significant for anemia, OA, Afib, CHF, DVT, GERD, HLD, PVD, PE, Lt TKA in 2018. Onset afib////RVR.   Clinical Impression   [name] is a 60 year old woman s/p knee replacement with a short ICU stay due to new onset of Afib who presents with decreased ROM/strength in RLE, generalized weakness, decreased activity tolerance, impaired balance and pain. On evaluation she required mod assist to transfer to side of bed, min assist x 2 to stand and transfer to [facility] and then recliner. Patient able to perform UB ADLs with setup and seated positioning and max assist for LB ADLs and toileting. Patient will benefit from skilled OT services while in hospital to improve deficits and learn compensatory strategies as needed in order to improve functional abilities and return home at discharge.      Recommendations for follow up therapy are one component of a multi-disciplinary discharge planning process, led by the attending physician.  Recommendations may be updated based on patient status, additional functional criteria and insurance authorization.   Follow Up Recommendations  Home health OT    Assistance Recommended at Discharge Frequent or constant Supervision/Assistance  Functional Status Assessment  Patient has had a recent decline in their functional status and demonstrates the ability to make significant improvements in function in a reasonable and predictable amount of time.  Equipment Recommendations  None recommended by OT    Recommendations for Other Services       Precautions / Restrictions Precautions Precautions: Fall;Knee Required Braces or Orthoses: Knee Immobilizer - Right Knee Immobilizer - Right: On when out of bed or walking Restrictions Weight Bearing  Restrictions: Yes Other Position/Activity Restrictions: WBAT      Mobility Bed Mobility Overal bed mobility: Needs Assistance Bed Mobility: Supine to Sit     Supine to sit: Mod assist;HOB elevated     General bed mobility comments: assistance with RLE and pivoting of hips    Transfers Overall transfer level: Needs assistance Equipment used: Rolling walker (2 wheels) Transfers: Sit to/from Stand;Bed to chair/wheelchair/BSC Sit to Stand: Min assist;+2 safety/equipment;From elevated surface     Step pivot transfers: Min assist;+2 safety/equipment     General transfer comment: MIn assist x 2 with RW to transfer to BSc and then take steps to recliner. Verbal cues needed for appropriate technique with walker and to stand erect.      Balance Overall balance assessment: Needs assistance Sitting-balance support: Single extremity supported Sitting balance-Leahy Scale: Fair     Standing balance support: During functional activity;Bilateral upper extremity supported;Reliant on assistive device for balance Standing balance-Leahy Scale: Poor                             ADL either performed or assessed with clinical judgement   ADL Overall ADL's : Needs assistance/impaired Eating/Feeding: Independent   Grooming: Set up;Sitting   Upper Body Bathing: Set up;Sitting   Lower Body Bathing: Moderate assistance;Sit to/from stand   Upper Body Dressing : Set up;Sitting   Lower Body Dressing: Sit to/from stand;Maximal assistance   Toilet Transfer: Minimal assistance;BSC/3in1;Stand-pivot;Rolling walker (2 wheels)   Toileting- Clothing Manipulation and Hygiene: Maximal assistance;Sit to/from stand               Vision Patient Visual Report: No  change from baseline       Perception     Praxis      Pertinent Vitals/Pain Pain Assessment: Faces Faces Pain Scale: Hurts even more Pain Location: R knee Pain Descriptors / Indicators: Discomfort;Sore Pain  Intervention(s): Monitored during session;Premedicated before session     Hand Dominance Right   Extremity/Trunk Assessment Upper Extremity Assessment Upper Extremity Assessment: RUE deficits/detail;LUE deficits/detail RUE Deficits / Details: WFL ROM, 5/5 strength RUE Sensation: WNL LUE Deficits / Details: WFL ROM, 5/5 strength LUE Coordination: WNL   Lower Extremity Assessment Lower Extremity Assessment: Defer to PT evaluation   Cervical / Trunk Assessment Cervical / Trunk Assessment: Other exceptions Cervical / Trunk Exceptions: habitus   Communication Communication Communication: No difficulties   Cognition Arousal/Alertness: Awake/alert Behavior During Therapy: WFL for tasks assessed/performed Overall Cognitive Status: Within Functional Limits for tasks assessed                                       General Comments       Exercises     Shoulder Instructions      Home Living Family/patient expects to be discharged to:: Private residence Living Arrangements: Spouse/significant other Available Help at Discharge: Family Type of Home: House Home Access: Stairs to enter Technical brewer of Steps: 1+1 Entrance Stairs-Rails: Right;Left Home Layout: One level     Bathroom Shower/Tub: Teacher, early years/pre: Handicapped height Bathroom Accessibility: No   Home Equipment: BSC/3in1;Cane - single point;Standard Environmental consultant;Toilet riser (recliner/lift chair)          Prior Functioning/Environment Prior Level of Function : Independent/Modified Independent;Working/employed                        OT Problem List: Decreased strength;Decreased range of motion;Decreased activity tolerance;Impaired balance (sitting and/or standing);Pain;Obesity;Decreased knowledge of use of DME or AE      OT Treatment/Interventions: Self-care/ADL training;Therapeutic exercise;DME and/or AE instruction;Therapeutic activities;Balance  training;Patient/family education    OT Goals(Current goals can be found in the care plan section) Acute Rehab OT Goals Patient Stated Goal: walk to bathroom OT Goal Formulation: With patient Time For Goal Achievement: 01/11/21 Potential to Achieve Goals: Good  OT Frequency: Min 2X/week   Barriers to D/C:            Co-evaluation              AM-PAC OT "6 Clicks" Daily Activity     Outcome Measure Help from another person eating meals?: None Help from another person taking care of personal grooming?: A Little Help from another person toileting, which includes using toliet, bedpan, or urinal?: A Lot Help from another person bathing (including washing, rinsing, drying)?: A Lot Help from another person to put on and taking off regular upper body clothing?: A Little Help from another person to put on and taking off regular lower body clothing?: A Lot 6 Click Score: 16   End of Session Equipment Utilized During Treatment: Rolling walker (2 wheels);Gait belt Nurse Communication: Mobility status  Activity Tolerance: Patient limited by pain;Patient limited by fatigue Patient left: in chair;with call bell/phone within reach;with chair alarm set;with family/visitor present  OT Visit Diagnosis: Other abnormalities of gait and mobility (R26.89);Pain                Time: 1100-1128 OT Time Calculation (min): 28 min Charges:  OT General Charges $OT Visit:  1 Visit OT Evaluation $OT Eval Low Complexity: 1 Low  Narayan Scull, OTR/L [place]  Office [phone] Pager: [place] 12/28/2020, 12:32 PM

## 2020-12-29 MED ORDER — SODIUM CHLORIDE 0.9 % IV BOLUS
500.0000 mL | Freq: Once | INTRAVENOUS | Status: AC
  Administered 2020-12-29: 500 mL via INTRAVENOUS

## 2020-12-29 NOTE — Progress Notes (Signed)
Patient ID: [name], female   DOB: 11/21/1960, 60 y.o.   MRN: [id number] There has been no acute changes over the last 24 hours.  Her heart rate is still normal.  Her vital signs are normal except when she got up with therapy this morning she did get orthostatic.  Her mobility is still significantly limited.  Her hemoglobin yesterday was 11.0.  Her resting vital signs again are normal.  Her right knee incision is clean and dry.  I did change the dressing.  She is on her full-strength Xarelto that she was on prior to surgery.  We will continue to take this day today until she is safe for mobility in a home setting.  We will recheck her tomorrow to see what progress she is making.  She and her husband who is at the bedside agree with this plan.

## 2020-12-29 NOTE — Progress Notes (Signed)
Physical Therapy Treatment Patient Details Name: [name] MRN: [id number] DOB: 1960/11/04 Today's Date: 12/29/2020   History of Present Illness Patient is 60 y.o. female s/p Rt TKA on 12/23/20 with PMH significant for anemia, OA, Afib, CHF, DVT, GERD, HLD, PVD, PE, Lt TKA in 2018. Onset afib////RVR.    PT Comments    Pt continues cooperative but progressing slowly and requiring increased time and assist of two for safe performance of basic mobility tasks.    Recommendations for follow up therapy are one component of a multi-disciplinary discharge planning process, led by the attending physician.  Recommendations may be updated based on patient status, additional functional criteria and insurance authorization.  Follow Up Recommendations  Home health PT     Assistance Recommended at Discharge Frequent or constant Supervision/Assistance  Equipment Recommendations  Rolling walker (2 wheels)    Recommendations for Other Services       Precautions / Restrictions Precautions Precautions: Fall;Knee Precaution Comments: did not use KI Knee Immobilizer - Right: On when out of bed or walking Restrictions Weight Bearing Restrictions: No RLE Weight Bearing: Weight bearing as tolerated     Mobility  Bed Mobility Overal bed mobility: Needs Assistance Bed Mobility: Supine to Sit     Supine to sit: Min assist;+2 for safety/equipment;HOB elevated     General bed mobility comments: cues for sequence and use of L LE to self assist;  Use of bed rails, HOB elevated, assist for R LE management and to bring trunk to upright    Transfers Overall transfer level: Needs assistance Equipment used: Rolling walker (2 wheels) Transfers: Sit to/from Stand;Bed to chair/wheelchair/BSC Sit to Stand: Min assist;From elevated surface;+2 safety/equipment;+2 physical assistance Stand pivot transfers: Min assist;+2 physical assistance;+2 safety/equipment         General transfer comment:  cues for LE management and use of UEs to self assist    Ambulation/Gait Ambulation/Gait assistance: Min assist;+2 physical assistance;+2 safety/equipment Gait Distance (Feet): 3 Feet Assistive device: Rolling walker (2 wheels) Gait Pattern/deviations: Step-to pattern;Trunk flexed;Antalgic Gait velocity: decr     General Gait Details: cues for sequence, posture and position from RW.  Distance ltd by pain   Stairs             Wheelchair Mobility    Modified Rankin (Stroke Patients Only)       Balance Overall balance assessment: Needs assistance Sitting-balance support: No upper extremity supported Sitting balance-Leahy Scale: Good     Standing balance support: During functional activity;Bilateral upper extremity supported;Reliant on assistive device for balance Standing balance-Leahy Scale: Poor                              Cognition Arousal/Alertness: Awake/alert Behavior During Therapy: WFL for tasks assessed/performed Overall Cognitive Status: Within Functional Limits for tasks assessed                                          Exercises Total Joint Exercises Ankle Circles/Pumps: AROM;Both;10 reps Quad Sets: AROM;Right;10 reps Heel Slides: AAROM;Right;20 reps;Supine Straight Leg Raises: AAROM;Right;20 reps;Supine    General Comments        Pertinent Vitals/Pain Pain Assessment: 0-10 Pain Score: 10-Worst pain ever Pain Location: R knee Pain Descriptors / Indicators: Aching;Grimacing;Sore Pain Intervention(s): Limited activity within patient's tolerance;Monitored during session;Premedicated before session;Patient requesting pain meds-RN notified;Ice applied  Home Living                          Prior Function            PT Goals (current goals can now be found in the care plan section) Acute Rehab PT Goals Patient Stated Goal: Regain IND PT Goal Formulation: With patient/family Time For Goal Achievement:  01/03/21 Potential to Achieve Goals: Good Progress towards PT goals: Progressing toward goals    Frequency    7X/week      PT Plan Current plan remains appropriate;Discharge plan needs to be updated    Co-evaluation              AM-PAC PT "6 Clicks" Mobility   Outcome Measure  Help needed turning from your back to your side while in a flat bed without using bedrails?: A Lot Help needed moving from lying on your back to sitting on the side of a flat bed without using bedrails?: Total Help needed moving to and from a bed to a chair (including a wheelchair)?: A Lot Help needed standing up from a chair using your arms (e.g., wheelchair or bedside chair)?: A Lot Help needed to walk in hospital room?: Total Help needed climbing 3-5 steps with a railing? : Total 6 Click Score: 9    End of Session Equipment Utilized During Treatment: Gait belt Activity Tolerance: Patient limited by fatigue;Patient limited by pain Patient left: in chair;with call bell/phone within reach;with chair alarm set;with family/visitor present Nurse Communication: Mobility status PT Visit Diagnosis: Other abnormalities of gait and mobility (R26.89);Pain Pain - Right/Left: Right Pain - part of body: Knee     Time: 2094-7096 PT Time Calculation (min) (ACUTE ONLY): 39 min  Charges:  $Therapeutic Exercise: 8-22 mins $Therapeutic Activity: 8-22 mins                     Debe Coder PT Acute Rehabilitation Services Pager [phone] Office [phone]    Joli Koob 12/29/2020, 1:41 PM

## 2020-12-29 NOTE — TOC Progression Note (Signed)
Transition of Care [facility]) - Progression Note    Patient Details  Name: [name] MRN: [id number] Date of Birth: 05-24-1960  [facility] of [facility]) CM/SW Contact  Lennart Pall, LCSW Phone Number: 12/29/2020, 11:14 AM  Clinical Narrative:     TOC had already ordered DME for pt prior to transfer to ICU (b[facility] r[facility] via [employer]) and HHPT was arranged with [facility].  TOC will sign off.  Please place new orders if additional assistance needed.    Barriers to Discharge: No Barriers Identified  Expected Discharge Plan and Services                           DME Arranged: [place] wide DME Agency: [employer] Date DME Agency Contacted: 12/26/20 Time DME Agency Contacted: [id number] Representative spoke with at DME Agency: [place]: PT [place]: [place] Date [place]: 12/26/20   Representative spoke with at [place]: [place] ([place]) Interventions    Readmission Risk Interventions No flowsheet data found.

## 2020-12-29 NOTE — Progress Notes (Addendum)
Informed [name] , PA about the pts orthostatic status while the pt was working with the PT. As per PT her BP was 95/82 standing and came back to normal after pt sat on chair.No new orders, will continue to monitor.   NS 500 ml bolus given as per Dr. Trevor Mace order.

## 2020-12-29 NOTE — Progress Notes (Signed)
Physical Therapy Treatment Patient Details Name: [name] MRN: [id number] DOB: [birth year] Today's Date: 12/29/2020   History of Present Illness Patient is 60 y.o. female s/p Rt TKA on 12/23/20 with PMH significant for anemia, OA, Afib, CHF, DVT, GERD, HLD, PVD, PE, Lt TKA in 2018. Onset afib////RVR.    PT Comments    Pt continues cooperative but progressing slowly and requiring increased time and assist of two for safe performance of all basic mobility tasks  Recommendations for follow up therapy are one component of a multi-disciplinary discharge planning process, led by the attending physician.  Recommendations may be updated based on patient status, additional functional criteria and insurance authorization.  Follow Up Recommendations  Home health PT     Assistance Recommended at Discharge Frequent or constant Supervision/Assistance  Equipment Recommendations  Rolling walker (2 wheels)    Recommendations for Other Services       Precautions / Restrictions Precautions Precautions: Fall;Knee Precaution Comments: did not use KI Restrictions Weight Bearing Restrictions: No RLE Weight Bearing: Weight bearing as tolerated     Mobility  Bed Mobility Overal bed mobility: Needs Assistance Bed Mobility: Sit to Supine       Sit to supine: Mod assist;Max assist;+2 for physical assistance;+2 for safety/equipment   General bed mobility comments: Increased time with cues for sequence and physical assist to manage LEs and to control trunk    Transfers Overall transfer level: Needs assistance Equipment used: Rolling walker (2 wheels) Transfers: Sit to/from Stand;Bed to chair/wheelchair/BSC Sit to Stand: Mod assist;+2 physical assistance;+2 safety/equipment;From elevated surface     Step pivot transfers: Min assist;+2 safety/equipment;+2 physical assistance     General transfer comment: cues for LE management and use of UEs to self assist; multiple attempts with  physical assist to bring wt up and fwd and to balance in initial standing    Ambulation/Gait Ambulation/Gait assistance: Min assist;+2 physical assistance;+2 safety/equipment Gait Distance (Feet): 5 Feet Assistive device: Rolling walker (2 wheels) Gait Pattern/deviations: Step-to pattern;Trunk flexed;Antalgic Gait velocity: decr     General Gait Details: Increased time with cues for sequence, posture and position from RW.  Distance ltd by fatigue   Stairs             Wheelchair Mobility    Modified Rankin (Stroke Patients Only)       Balance Overall balance assessment: Needs assistance Sitting-balance support: No upper extremity supported Sitting balance-Leahy Scale: Good     Standing balance support: During functional activity;Bilateral upper extremity supported;Reliant on assistive device for balance Standing balance-Leahy Scale: Poor                              Cognition Arousal/Alertness: Awake/alert Behavior During Therapy: WFL for tasks assessed/performed Overall Cognitive Status: Within Functional Limits for tasks assessed                                 General Comments: more alert        Exercises      General Comments        Pertinent Vitals/Pain Pain Assessment: 0-10 Pain Score: 5  Pain Location: R knee Pain Descriptors / Indicators: Aching;Sore Pain Intervention(s): Limited activity within patient's tolerance;Monitored during session;Premedicated before session;Ice applied    Home Living  Prior Function            PT Goals (current goals can now be found in the care plan section) Acute Rehab PT Goals Patient Stated Goal: Regain IND PT Goal Formulation: With patient/family Time For Goal Achievement: 01/03/21 Potential to Achieve Goals: Good Progress towards PT goals: Progressing toward goals    Frequency    7X/week      PT Plan Current plan remains  appropriate;Discharge plan needs to be updated    Co-evaluation              AM-PAC PT "6 Clicks" Mobility   Outcome Measure  Help needed turning from your back to your side while in a flat bed without using bedrails?: A Lot Help needed moving from lying on your back to sitting on the side of a flat bed without using bedrails?: A Lot Help needed moving to and from a bed to a chair (including a wheelchair)?: A Lot Help needed standing up from a chair using your arms (e.g., wheelchair or bedside chair)?: A Lot Help needed to walk in hospital room?: Total Help needed climbing 3-5 steps with a railing? : Total 6 Click Score: 10    End of Session Equipment Utilized During Treatment: Gait belt Activity Tolerance: Patient limited by fatigue;Patient limited by pain Patient left: in bed;with call bell/phone within reach;with family/visitor present Nurse Communication: Mobility status PT Visit Diagnosis: Other abnormalities of gait and mobility (R26.89);Pain Pain - Right/Left: Right Pain - part of body: Knee     Time: 1455-1530 PT Time Calculation (min) (ACUTE ONLY): 35 min  Charges:  $Gait Training: 8-22 mins $Therapeutic Activity: 8-22 mins                     Debe Coder PT Acute Rehabilitation Services Pager [phone] Office [phone]    Keegen Heffern 12/29/2020, 4:30 PM

## 2020-12-29 NOTE — Plan of Care (Signed)
  Problem: Clinical Measurements: Goal: Ability to maintain clinical measurements within normal limits will improve Outcome: Progressing   Problem: Activity: Goal: Risk for activity intolerance will decrease Outcome: Progressing   Problem: Pain Managment: Goal: General experience of comfort will improve Outcome: Progressing   

## 2020-12-30 LAB — CBC
HCT: 32.9 % — ABNORMAL LOW (ref 36.0–46.0)
Hemoglobin: 9.9 g/dL — ABNORMAL LOW (ref 12.0–15.0)
MCH: 24.5 pg — ABNORMAL LOW (ref 26.0–34.0)
MCHC: 30.1 g/dL (ref 30.0–36.0)
MCV: 81.4 fL (ref 80.0–100.0)
Platelets: 209 10*3/uL (ref 150–400)
RBC: 4.04 MIL/uL (ref 3.87–5.11)
RDW: 17.3 % — ABNORMAL HIGH (ref 11.5–15.5)
WBC: 7.2 10*3/uL (ref 4.0–10.5)
nRBC: 0 % (ref 0.0–0.2)

## 2020-12-30 MED ORDER — METOPROLOL SUCCINATE ER 50 MG PO TB24
50.0000 mg | ORAL_TABLET | Freq: Every day | ORAL | 0 refills | Status: DC
Start: 2020-12-31 — End: 2021-02-08

## 2020-12-30 NOTE — Progress Notes (Signed)
Physical Therapy Treatment Patient Details Name: [name] MRN: [id number] DOB: Sep 27, 1960 Today's Date: 12/30/2020   History of Present Illness Patient is 60 y.o. female s/p Rt TKA on 12/23/20 with PMH significant for anemia, OA, Afib, CHF, DVT, GERD, HLD, PVD, PE, Lt TKA in 2018. Onset afib////RVR.    PT Comments    Pt very cooperative this am and with marked improvement in level of alertness noted.  Pt performed therex program and up to ambulate increased, but still limited, distance making it to hallway for first time in a week.  Pt motivated to progress to dc home but spouse reports she has to walk 30' and climb one step to get into home.   Recommendations for follow up therapy are one component of a multi-disciplinary discharge planning process, led by the attending physician.  Recommendations may be updated based on patient status, additional functional criteria and insurance authorization.  Follow Up Recommendations  Home health PT     Assistance Recommended at Discharge Frequent or constant Supervision/Assistance  Equipment Recommendations  Rolling walker (2 wheels)    Recommendations for Other Services       Precautions / Restrictions Precautions Precautions: Fall;Knee Required Braces or Orthoses: Knee Immobilizer - Right Knee Immobilizer - Right: On when out of bed or walking Restrictions Weight Bearing Restrictions: No RLE Weight Bearing: Weight bearing as tolerated     Mobility  Bed Mobility Overal bed mobility: Needs Assistance Bed Mobility: Supine to Sit     Supine to sit: Min assist     General bed mobility comments: INcreased time with extensive use of bedrails and assist to manage R LE    Transfers Overall transfer level: Needs assistance Equipment used: Rolling walker (2 wheels) Transfers: Sit to/from Stand Sit to Stand: Min assist;From elevated surface           General transfer comment: cues for LE management and use of UES to self  assist.  Bed elevated to assist as pt will be using lift chair at home.    Ambulation/Gait Ambulation/Gait assistance: Min assist;+2 safety/equipment (chair follow) Gait Distance (Feet): 22 Feet Assistive device: Rolling walker (2 wheels) Gait Pattern/deviations: Step-to pattern;Trunk flexed;Antalgic;Decreased step length - right;Decreased step length - left;Shuffle Gait velocity: decr     General Gait Details: Increased time with cues for sequence, posture and position from RW.  Distance ltd by fatigue   Stairs             Wheelchair Mobility    Modified Rankin (Stroke Patients Only)       Balance Overall balance assessment: Needs assistance Sitting-balance support: No upper extremity supported Sitting balance-Leahy Scale: Good     Standing balance support: During functional activity;Bilateral upper extremity supported;Reliant on assistive device for balance Standing balance-Leahy Scale: Poor                              Cognition Arousal/Alertness: Awake/alert Behavior During Therapy: WFL for tasks assessed/performed Overall Cognitive Status: Within Functional Limits for tasks assessed                                 General Comments: more alert        Exercises Total Joint Exercises Ankle Circles/Pumps: AROM;Both;10 reps Quad Sets: AROM;Right;10 reps Heel Slides: AAROM;Right;20 reps;Supine Straight Leg Raises: AAROM;Right;20 reps;Supine Goniometric ROM: -5 - 35 - pain limited  General Comments        Pertinent Vitals/Pain Pain Assessment: 0-10 Pain Score: 5  Pain Location: R knee Pain Descriptors / Indicators: Aching;Sore Pain Intervention(s): Limited activity within patient's tolerance;Monitored during session;Premedicated before session;Ice applied    Home Living                          Prior Function            PT Goals (current goals can now be found in the care plan section) Acute Rehab PT  Goals Patient Stated Goal: Regain IND PT Goal Formulation: With patient/family Time For Goal Achievement: 01/03/21 Potential to Achieve Goals: Good Progress towards PT goals: Progressing toward goals    Frequency    7X/week      PT Plan Current plan remains appropriate;Discharge plan needs to be updated    Co-evaluation              AM-PAC PT "6 Clicks" Mobility   Outcome Measure  Help needed turning from your back to your side while in a flat bed without using bedrails?: A Lot Help needed moving from lying on your back to sitting on the side of a flat bed without using bedrails?: A Lot Help needed moving to and from a bed to a chair (including a wheelchair)?: A Lot Help needed standing up from a chair using your arms (e.g., wheelchair or bedside chair)?: A Lot Help needed to walk in hospital room?: A Little Help needed climbing 3-5 steps with a railing? : Total 6 Click Score: 12    End of Session Equipment Utilized During Treatment: Gait belt Activity Tolerance: Patient tolerated treatment well Patient left: in chair;with call bell/phone within reach;with chair alarm set;with family/visitor present Nurse Communication: Mobility status PT Visit Diagnosis: Other abnormalities of gait and mobility (R26.89);Pain Pain - Right/Left: Right Pain - part of body: Knee     Time: 8546-2703 PT Time Calculation (min) (ACUTE ONLY): 39 min  Charges:  $Gait Training: 8-22 mins $Therapeutic Exercise: 8-22 mins $Therapeutic Activity: 8-22 mins                     Debe Coder PT Acute Rehabilitation Services Pager [phone] Office [phone]    [facility] 12/30/2020, 12:36 PM

## 2020-12-30 NOTE — Progress Notes (Signed)
Patient ID: [name], female   DOB: May 12, 1960, 60 y.o.   MRN: [id number] The patient feels good this morning and looks better overall.  Her heart rate is normal.  Her right knee is stable.  I did change the dressing.  She has been orthostatic when she gets up.  At this point, she feels confident about being discharged to home with her husband this afternoon.  She does have a lift chair at home and the things that she needs from a mobility standpoint.  I will go ahead and put in for discharge for later today and if things change they can inform the on-call person from the group.  All questions and concerns were answered and addressed.

## 2020-12-30 NOTE — Plan of Care (Signed)
  Problem: Education: Goal: Knowledge of General Education information will improve Description: Including pain rating scale, medication(s)/side effects and non-pharmacologic comfort measures Outcome: Progressing   Problem: Health Behavior/Discharge Planning: Goal: Ability to manage health-related needs will improve Outcome: Progressing   Problem: Clinical Measurements: Goal: Ability to maintain clinical measurements within normal limits will improve Outcome: Progressing Goal: Will remain free from infection Outcome: Progressing Goal: Diagnostic test results will improve Outcome: Progressing Goal: Respiratory complications will improve Outcome: Progressing Goal: Cardiovascular complication will be avoided Outcome: Progressing   Problem: Activity: Goal: Risk for activity intolerance will decrease Outcome: Progressing   Problem: Coping: Goal: Level of anxiety will decrease Outcome: Progressing   Problem: Elimination: Goal: Will not experience complications related to bowel motility Outcome: Progressing Goal: Will not experience complications related to urinary retention Outcome: Progressing   Problem: Pain Managment: Goal: General experience of comfort will improve Outcome: Progressing   Problem: Safety: Goal: Ability to remain free from injury will improve Outcome: Progressing   Problem: Skin Integrity: Goal: Risk for impaired skin integrity will decrease Outcome: Progressing   Problem: Education: Goal: Knowledge of the prescribed therapeutic regimen will improve Outcome: Progressing   Problem: Activity: Goal: Ability to avoid complications of mobility impairment will improve Outcome: Progressing Goal: Range of joint motion will improve Outcome: Progressing   Problem: Clinical Measurements: Goal: Postoperative complications will be avoided or minimized Outcome: Progressing   Problem: Pain Management: Goal: Pain level will decrease with appropriate  interventions Outcome: Progressing   Problem: Skin Integrity: Goal: Will show signs of wound healing Outcome: Progressing

## 2020-12-30 NOTE — Discharge Summary (Signed)
Patient ID: [name] MRN: [id number] DOB/AGE: 04/17/1960 60 y.o.  Admit date: 12/23/2020 Discharge date: 12/30/2020  Admission Diagnoses:  Principal Problem:   Unilateral primary osteoarthritis, right knee Active Problems:   Status post total right knee replacement   Discharge Diagnoses:  Same  Past Medical History:  Diagnosis Date   Anemia    Arthritis    Atrial fibrillation ([place])    Bleeding ulcer 12-2010  hospitalized for 1 week   Blood transfusion    Blood transfusion without reported diagnosis    2011-2 units transfused, bleeding ulcer   Bruises easily    CHF (congestive heart failure) ([place])    with PE resolved after    Complication of anesthesia    Constipation    Difficulty in swallowing 03/24/2013   no longer a problem   Diverticulitis    DVT (deep venous thrombosis) ([place]) 06/2013   right leg   Dysrhythmia     Hx of A fib    GERD (gastroesophageal reflux disease)    Hyperlipidemia    Leg swelling 03/24/2013   not a problem any longer   Osteoarthritis    Peripheral vascular disease ([place])    Pre-diabetes    Pulmonary embolus ([place]) 06/2013   Reflux    Shortness of breath    with PE   Vomiting 03/24/2013   problem resolved after lap band revision    Surgeries: Procedure(s): RIGHT TOTAL KNEE ARTHROPLASTY on 12/23/2020   Consultants:   Discharged Condition: Improved  Hospital Course: [name] is an 60 y.o. female who was admitted 12/23/2020 for operative treatment ofUnilateral primary osteoarthritis, right knee. Patient has severe unremitting pain that affects sleep, daily activities, and work/hobbies. After pre-op clearance the patient was taken to the operating room on 12/23/2020 and underwent  Procedure(s): RIGHT TOTAL KNEE ARTHROPLASTY.    Patient was given perioperative antibiotics:  Anti-infectives (From admission, onward)    Start     Dose/Rate Route Frequency Ordered Stop   12/23/20 2200  hydroxychloroquine (PLAQUENIL)  tablet 200 mg        200 mg Oral 2 times daily 12/23/20 1610     12/23/20 2000  clindamycin (CLEOCIN) IVPB 600 mg        600 mg 100 mL/hr over 30 Minutes Intravenous Every 6 hours 12/23/20 1610 12/24/20 0225   12/23/20 1700  nitrofurantoin (macrocrystal-monohydrate) (MACROBID) capsule 100 mg        100 mg Oral Daily 12/23/20 1610     12/23/20 1000  clindamycin (CLEOCIN) IVPB 900 mg        900 mg 100 mL/hr over 30 Minutes Intravenous On call to O.R. 12/23/20 0958 12/23/20 1249        Patient was given sequential compression devices, early ambulation, and chemoprophylaxis to prevent DVT. Postoperatively the patient did develop atrial fibrillation with RVR.  She had to be transferred to the stepdown unit and cardiology was consulted.  After medications, her heart rate returned to normal.  He remained normal throughout the remainder of her hospitalization. Patient benefited maximally from hospital stay.  Recent vital signs: Patient Vitals for the past 24 hrs:  BP Temp Temp src Pulse Resp SpO2  12/30/20 0519 (!) 125/55 98.4 F (36.9 C) Oral 79 17 99 %  12/29/20 2112 (!) 145/60 99.2 F (37.3 C) Oral 79 16 99 %  12/29/20 1401 (!) 142/69 98.1 F (36.7 C) Oral 82 16 99 %     Recent laboratory studies:  Recent Labs  12/28/20 0244 12/30/20 0901  WBC 7.3 7.2  HGB 11.0* 9.9*  HCT 38.2 32.9*  PLT 183 209  NA 132*  --   K 4.1  --   CL 98  --   CO2 26  --   BUN 10  --   CREATININE 0.64  --   GLUCOSE 88  --   CALCIUM 8.0*  --      Discharge Medications:   Allergies as of 12/30/2020       Reactions   Contrast Media [iodinated Diagnostic Agents] Shortness Of Breath, Other (See Comments)   APNEA   Penicillins Itching, Rash   PATIENT HAD A PCN REACTION WITH IMMEDIATE RASH, FACIAL/TONGUE/THROAT SWELLING, SOB, OR LIGHTHEADEDNESS WITH HYPOTENSION:  #  #  #  YES  #  #  #   Has patient had a PCN reaction causing severe rash involving mucus membranes or skin necrosis: NO Has patient  had a PCN reaction that required hospitalization NO Has patient had a PCN reaction occurring within the last 10 years: NO   Ancef [cefazolin Sodium] Nausea And Vomiting   Tolerates IV        Medication List     STOP taking these medications    enoxaparin 150 MG/ML injection Commonly known as: LOVENOX       TAKE these medications    hydroxychloroquine 200 MG tablet Commonly known as: PLAQUENIL TAKE 1 TABLET BY MOUTH TWICE A DAY   methocarbamol 500 MG tablet Commonly known as: ROBAXIN Take 1 tablet (500 mg total) by mouth every 6 (six) hours as needed for muscle spasms.   metoprolol succinate 25 MG 24 hr tablet Commonly known as: TOPROL-XL Take 1 tablet (25 mg total) by mouth daily. What changed: Another medication with the same name was added. Make sure you understand how and when to take each.   metoprolol succinate 50 MG 24 hr tablet Commonly known as: TOPROL-XL Take 1 tablet (50 mg total) by mouth daily. Take with or immediately following a meal. Start taking on: December 31, 2020 What changed: You were already taking a medication with the same name, and this prescription was added. Make sure you understand how and when to take each.   multivitamin tablet Take 1 tablet by mouth daily.   nitrofurantoin (macrocrystal-monohydrate) 100 MG capsule Commonly known as: MACROBID Take 100 mg by mouth daily.   omeprazole 40 MG capsule Commonly known as: PRILOSEC Take 40 mg by mouth daily.   oxyCODONE 5 MG immediate release tablet Commonly known as: Oxy IR/ROXICODONE Take 1-2 tablets (5-10 mg total) by mouth every 4 (four) hours as needed for moderate pain (pain score 4-6).   rivaroxaban 20 MG Tabs tablet Commonly known as: XARELTO Take 20 mg by mouth daily.   tirzepatide 2.5 MG/0.5ML Pen Commonly known as: MOUNJARO Inject 7.5 mg into the skin once a week.   tolterodine 2 MG 24 hr capsule Commonly known as: DETROL LA Take 2 mg by mouth daily.                Durable Medical Equipment  (From admission, onward)           Start     Ordered   12/23/20 1611  DME 3 n 1  Once        12/23/20 1610   12/23/20 1611  DME Walker rolling  Once       Question Answer Comment  Walker: With 5 Inch Wheels   Patient needs a walker  to treat with the following condition Status post total right knee replacement      12/23/20 1610            Diagnostic Studies: DG Knee Right Port  Result Date: 12/23/2020 CLINICAL DATA:  Right total knee replacement. EXAM: PORTABLE RIGHT KNEE - 1-2 VIEW COMPARISON:  Right knee x-rays dated February 26, 2019. FINDINGS: The right knee demonstrates a total knee arthroplasty without evidence of hardware failure or complication. There is expected intra-articular air. There is no fracture or dislocation. The alignment is anatomic. Post-surgical changes noted in the surrounding soft tissues. IMPRESSION: 1. Right total knee arthroplasty without evidence of acute postoperative complication. Electronically Signed   By: Titus Dubin M.D.   On: 12/23/2020 14:56    Disposition: Discharge disposition: 01-Home or [place]     [name], MD Follow up in 2 week(s).   Specialty: Orthopedic Surgery Contact information: [place] [address] [place], [place] Follow up.   Specialty: [place] Why: PT Contact information: [place] [address] [phone]         Loel Dubonnet, NP Follow up on 01/20/2021.   Specialty: Cardiology Why: @9 :15 am for hospital follow up Contact information: [address] [place] [address] [phone]                  Signed: [name] 12/30/2020, 9:39 AM

## 2020-12-30 NOTE — Progress Notes (Signed)
PT Cancellation Note  Patient Details Name: SHERI GATCHEL MRN: [id number] DOB: Oct 20, 1960   Cancelled Treatment:     PT attempted x 2 this pm but deferred at pt request 2* pain level.  Will follow in am.   Zhara Gieske 12/30/2020, 4:15 PM

## 2020-12-30 NOTE — Plan of Care (Signed)
  Problem: Activity: Goal: Risk for activity intolerance will decrease Outcome: Progressing   Problem: Pain Managment: Goal: General experience of comfort will improve Outcome: Progressing   Problem: Safety: Goal: Ability to remain free from injury will improve Outcome: Progressing   

## 2020-12-30 NOTE — Plan of Care (Signed)
  Problem: Education: Goal: Knowledge of General Education information will improve Description: Including pain rating scale, medication(s)/side effects and non-pharmacologic comfort measures Outcome: Progressing   Problem: Coping: Goal: Level of anxiety will decrease Outcome: Progressing   Problem: Pain Managment: Goal: General experience of comfort will improve Outcome: Progressing   

## 2020-12-31 NOTE — Progress Notes (Signed)
Physical Therapy Treatment Patient Details Name: [name] MRN: [id number] DOB: Jan 26, 1961 Today's Date: 12/31/2020   History of Present Illness Patient is 60 y.o. female s/p Rt TKA on 12/23/20 with PMH significant for anemia, OA, Afib, CHF, DVT, GERD, HLD, PVD, PE, Lt TKA in 2018. Onset afib////RVR.    PT Comments    Pt alert and cooperative this am and continues to progress but slowly with mobility tasks.  Pt up to ambulate increased, but still limited, distance in hall and ,with difficulty, negotiated single step with rail and crutch.  Pt hopeful for dc home this date.  Recommendations for follow up therapy are one component of a multi-disciplinary discharge planning process, led by the attending physician.  Recommendations may be updated based on patient status, additional functional criteria and insurance authorization.  Follow Up Recommendations  Home health PT     Assistance Recommended at Discharge Frequent or constant Supervision/Assistance  Equipment Recommendations  Rolling walker (2 wheels)    Recommendations for Other Services       Precautions / Restrictions Precautions Precautions: Fall;Knee Precaution Comments: did not use KI Restrictions Weight Bearing Restrictions: No RLE Weight Bearing: Weight bearing as tolerated     Mobility  Bed Mobility Overal bed mobility: Needs Assistance Bed Mobility: Supine to Sit     Supine to sit: Min assist     General bed mobility comments: INcreased time with extensive use of bedrails and assist to manage R LE    Transfers Overall transfer level: Needs assistance Equipment used: Rolling walker (2 wheels) Transfers: Sit to/from Stand Sit to Stand: Min assist;From elevated surface           General transfer comment: cues for LE management and use of UES to self assist.  Bed elevated to assist as pt will be using lift chair at home.    Ambulation/Gait Ambulation/Gait assistance: Min assist;Min guard Gait  Distance (Feet): 28 Feet Assistive device: Rolling walker (2 wheels) Gait Pattern/deviations: Step-to pattern;Trunk flexed;Antalgic;Decreased step length - right;Decreased step length - left;Shuffle Gait velocity: decr     General Gait Details: Increased time with cues for sequence, posture and position from RW.  Distance ltd by fatigue   Stairs Stairs: Yes Stairs assistance: Min assist Stair Management: One rail Right;Step to pattern;Forwards;With crutches Number of Stairs: 1     Wheelchair Mobility    Modified Rankin (Stroke Patients Only)       Balance Overall balance assessment: Needs assistance Sitting-balance support: No upper extremity supported Sitting balance-Leahy Scale: Good     Standing balance support: During functional activity;Single extremity supported Standing balance-Leahy Scale: Poor                              Cognition Arousal/Alertness: Awake/alert Behavior During Therapy: WFL for tasks assessed/performed Overall Cognitive Status: Within Functional Limits for tasks assessed                                          Exercises      General Comments        Pertinent Vitals/Pain Pain Assessment: 0-10 Pain Score: 7  Pain Location: R knee Pain Descriptors / Indicators: Aching;Sore Pain Intervention(s): Limited activity within patient's tolerance;Monitored during session;Premedicated before session;Repositioned;Patient requesting pain meds-RN notified    Home Living  Prior Function            PT Goals (current goals can now be found in the care plan section) Acute Rehab PT Goals Patient Stated Goal: Regain IND PT Goal Formulation: With patient/family Time For Goal Achievement: 01/03/21 Potential to Achieve Goals: Good Progress towards PT goals: Progressing toward goals    Frequency    7X/week      PT Plan Current plan remains appropriate;Discharge plan needs to  be updated    Co-evaluation              AM-PAC PT "6 Clicks" Mobility   Outcome Measure  Help needed turning from your back to your side while in a flat bed without using bedrails?: A Lot Help needed moving from lying on your back to sitting on the side of a flat bed without using bedrails?: A Lot Help needed moving to and from a bed to a chair (including a wheelchair)?: A Lot Help needed standing up from a chair using your arms (e.g., wheelchair or bedside chair)?: A Lot Help needed to walk in hospital room?: A Little Help needed climbing 3-5 steps with a railing? : A Little 6 Click Score: 14    End of Session Equipment Utilized During Treatment: Gait belt Activity Tolerance: Patient tolerated treatment well;Patient limited by fatigue;Patient limited by pain Patient left: in chair;with call bell/phone within reach;with chair alarm set;with family/visitor present Nurse Communication: Mobility status PT Visit Diagnosis: Other abnormalities of gait and mobility (R26.89);Pain Pain - Right/Left: Right Pain - part of body: Knee     Time: 1026-1100 PT Time Calculation (min) (ACUTE ONLY): 34 min  Charges:  $Gait Training: 8-22 mins $Therapeutic Activity: 8-22 mins                     [place] Pager [phone] Office [phone]    Manveer Gomes 12/31/2020, 11:57 AM

## 2020-12-31 NOTE — Plan of Care (Signed)
  Problem: Clinical Measurements: Goal: Ability to maintain clinical measurements within normal limits will improve Outcome: Progressing   Problem: Activity: Goal: Risk for activity intolerance will decrease Outcome: Progressing   Problem: Pain Managment: Goal: General experience of comfort will improve Outcome: Progressing   

## 2020-12-31 NOTE — Plan of Care (Signed)
Discharge instructions including medications given to the Patient and her husband.

## 2020-12-31 NOTE — Progress Notes (Signed)
Occupational Therapy Treatment Patient Details Name: [name] MRN: [id number] DOB: 10-29-1960 Today's Date: 12/31/2020   History of present illness 60 y.o. female s/p Rt TKA on 12/23/20 with PMH significant for anemia, OA, Afib, CHF, DVT, GERD, HLD, PVD, PE, Lt TKA in 2018. Onset afib////RVR.   OT comments  Pt progressing towards established OT goals. Providing education on LB dressing, toileting, and tub transfer with use of 3n1. Pt reporting she plans on showering in a walk-in shower at her porch; continued to recommend use of 3n1. Pt declining OOB activity. Husband reporting he feels comfortable with compensatory techniques. Answering all questions in preparation for dc later today.    Recommendations for follow up therapy are one component of a multi-disciplinary discharge planning process, led by the attending physician.  Recommendations may be updated based on patient status, additional functional criteria and insurance authorization.    Follow Up Recommendations  Home health OT    Assistance Recommended at Discharge Frequent or constant Supervision/Assistance  Equipment Recommendations  None recommended by OT    Recommendations for Other Services      Precautions / Restrictions Precautions Precautions: Fall;Knee Precaution Comments: did not use KI Required Braces or Orthoses: Knee Immobilizer - Right Knee Immobilizer - Right: On when out of bed or walking Restrictions Weight Bearing Restrictions: Yes RLE Weight Bearing: Weight bearing as tolerated       Mobility Bed Mobility Overal bed mobility: Needs Assistance Bed Mobility: Sit to Supine     Supine to sit: Min assist Sit to supine: Min assist;Mod assist   General bed mobility comments: Declined OOB activity    Transfers Overall transfer level: Needs assistance Equipment used: Rolling walker (2 wheels) Transfers: Sit to/from Stand Sit to Stand: Min assist;From elevated surface           General  transfer comment: cues for LE management and use of UES to self assist.  Assist to bring up and fwd.  Pt uses lift chair at home     Balance Overall balance assessment: Needs assistance Sitting-balance support: No upper extremity supported Sitting balance-Leahy Scale: Good     Standing balance support: During functional activity;Single extremity supported Standing balance-Leahy Scale: Poor                             ADL either performed or assessed with clinical judgement   ADL Overall ADL's : Needs assistance/impaired                       Lower Body Dressing Details (indicate cue type and reason): Reviewing LB dressing techniques; threading RLE first. Also providing education on use of reacher. Pt and husband reporting they feel comfortable with LB dressing.           Tub/Shower Transfer Details (indicate cue type and reason): Educating pt on use of 3n1 as a shower seat - facing outward in tub. Husband agreeing this would be helpful. Pt reporting she plans to shower outside in an outdoor shower as he does not have any threshold. recommending pt use 3n1 as shower seat there   General ADL Comments: Reviewing education on LB dressing, toilet transfer, and tub transfer.    Extremity/Trunk Assessment Upper Extremity Assessment Upper Extremity Assessment: Generalized weakness   Lower Extremity Assessment Lower Extremity Assessment: Defer to PT evaluation RLE Deficits / Details: limited by pain RLE: Unable to fully assess due to pain RLE Sensation: WNL  RLE Coordination: WNL        Vision       Perception     Praxis      Cognition Arousal/Alertness: Awake/alert Behavior During Therapy: WFL for tasks assessed/performed Overall Cognitive Status: Within Functional Limits for tasks assessed                                 General Comments: Pt slightly lethargic          Exercises Total Joint Exercises Ankle Circles/Pumps:  AROM;Both;10 reps Quad Sets: AROM;Right;10 reps Heel Slides: AAROM;Right;20 reps;Supine Hip ABduction/ADduction: AAROM;Right;10 reps Straight Leg Raises: AAROM;Right;20 reps;Supine Goniometric ROM: -5-40   Shoulder Instructions       General Comments husband present throughout    Pertinent Vitals/ Pain       Pain Assessment: Faces Pain Score: 7  Faces Pain Scale: Hurts even more Pain Location: R knee Pain Descriptors / Indicators: Aching;Sore Pain Intervention(s): Monitored during session  Home Living                                          Prior Functioning/Environment              Frequency  Min 2X/week        Progress Toward Goals  OT Goals(current goals can now be found in the care plan section)  Progress towards OT goals: Progressing toward goals  Acute Rehab OT Goals OT Goal Formulation: With patient Time For Goal Achievement: 01/11/21 Potential to Achieve Goals: Good ADL Goals Pt Will Perform Lower Body Dressing: with min assist;sit to/from stand Pt Will Transfer to Toilet: with min guard assist;ambulating;regular height toilet;grab bars Pt Will Perform Toileting - Clothing Manipulation and hygiene: with min guard assist;sit to/from stand Additional ADL Goal #1: Patient will stand at sink to perform grooming task as evidence of improving activity tolerance  Plan Discharge plan remains appropriate    Co-evaluation                 AM-PAC OT "6 Clicks" Daily Activity     Outcome Measure   Help from another person eating meals?: None Help from another person taking care of personal grooming?: A Little Help from another person toileting, which includes using toliet, bedpan, or urinal?: A Lot Help from another person bathing (including washing, rinsing, drying)?: A Lot Help from another person to put on and taking off regular upper body clothing?: A Little Help from another person to put on and taking off regular lower body  clothing?: A Lot 6 Click Score: 16    End of Session    OT Visit Diagnosis: Other abnormalities of gait and mobility (R26.89);Pain   Activity Tolerance Patient limited by pain;Patient limited by fatigue   Patient Left with call bell/phone within reach;with family/visitor present;in bed   Nurse Communication Mobility status        Time: 7544-9201 OT Time Calculation (min): 22 min  Charges: OT General Charges $OT Visit: 1 Visit OT Treatments $Self Care/Home Management : 8-22 mins  [place], OTR/L Acute Rehab Pager: [phone] Office: [place] 12/31/2020, 2:51 PM

## 2020-12-31 NOTE — Progress Notes (Signed)
Physical Therapy Treatment Patient Details Name: [name] MRN: [id number] DOB: 11/29/1960 Today's Date: 12/31/2020   History of Present Illness Patient is 60 y.o. female s/p Rt TKA on 12/23/20 with PMH significant for anemia, OA, Afib, CHF, DVT, GERD, HLD, PVD, PE, Lt TKA in 2018. Onset afib////RVR.    PT Comments    Pt assisted up from chair to bed to perform therex program.  Written instruction provided.  Pt and spouse both confirm they feel comfortable with ability to manage at home at current level including negotiating step up into home.   Recommendations for follow up therapy are one component of a multi-disciplinary discharge planning process, led by the attending physician.  Recommendations may be updated based on patient status, additional functional criteria and insurance authorization.  Follow Up Recommendations  Home health PT     Assistance Recommended at Discharge Frequent or constant Supervision/Assistance  Equipment Recommendations  Rolling walker (2 wheels)    Recommendations for Other Services       Precautions / Restrictions Precautions Precautions: Fall;Knee Precaution Comments: did not use KI Restrictions Weight Bearing Restrictions: No RLE Weight Bearing: Weight bearing as tolerated     Mobility  Bed Mobility Overal bed mobility: Needs Assistance Bed Mobility: Sit to Supine     Supine to sit: Min assist Sit to supine: Min assist;Mod assist   General bed mobility comments: Increased time with use of bedrail and assist for LEs    Transfers Overall transfer level: Needs assistance Equipment used: Rolling walker (2 wheels) Transfers: Sit to/from Stand Sit to Stand: Min assist;From elevated surface           General transfer comment: cues for LE management and use of UES to self assist.  Assist to bring up and fwd.  Pt uses lift chair at home    Ambulation/Gait Ambulation/Gait assistance: Min guard Gait Distance (Feet): 5  Feet Assistive device: Rolling walker (2 wheels) Gait Pattern/deviations: Step-to pattern;Trunk flexed;Antalgic;Decreased step length - right;Decreased step length - left;Shuffle Gait velocity: decr     General Gait Details: Increased time with cues for sequence, posture and position from RW.  Moved chair to bed   Stairs Stairs: Yes Stairs assistance: Min assist Stair Management: One rail Right;Step to pattern;Forwards;With crutches Number of Stairs: 1 General stair comments: Pt declines to reattempt stairs.  Pt and spouse state they feel comfortable with ability to manage   Wheelchair Mobility    Modified Rankin (Stroke Patients Only)       Balance Overall balance assessment: Needs assistance Sitting-balance support: No upper extremity supported Sitting balance-Leahy Scale: Good     Standing balance support: During functional activity;Single extremity supported Standing balance-Leahy Scale: Poor                              Cognition Arousal/Alertness: Awake/alert Behavior During Therapy: WFL for tasks assessed/performed Overall Cognitive Status: Within Functional Limits for tasks assessed                                          Exercises Total Joint Exercises Ankle Circles/Pumps: AROM;Both;10 reps Quad Sets: AROM;Right;10 reps Heel Slides: AAROM;Right;20 reps;Supine Hip ABduction/ADduction: AAROM;Right;10 reps Straight Leg Raises: AAROM;Right;20 reps;Supine Goniometric ROM: -5-40    General Comments        Pertinent Vitals/Pain Pain Assessment: 0-10 Pain Score: 7  Pain Location: R knee Pain Descriptors / Indicators: Aching;Sore Pain Intervention(s): Limited activity within patient's tolerance;Monitored during session;Premedicated before session;Ice applied    Home Living                          Prior Function            PT Goals (current goals can now be found in the care plan section) Acute Rehab PT  Goals Patient Stated Goal: Regain IND PT Goal Formulation: With patient/family Time For Goal Achievement: 01/03/21 Potential to Achieve Goals: Good Progress towards PT goals: Progressing toward goals    Frequency    7X/week      PT Plan Current plan remains appropriate;Discharge plan needs to be updated    Co-evaluation              AM-PAC PT "6 Clicks" Mobility   Outcome Measure  Help needed turning from your back to your side while in a flat bed without using bedrails?: A Lot Help needed moving from lying on your back to sitting on the side of a flat bed without using bedrails?: A Lot Help needed moving to and from a bed to a chair (including a wheelchair)?: A Lot Help needed standing up from a chair using your arms (e.g., wheelchair or bedside chair)?: A Little Help needed to walk in hospital room?: A Little Help needed climbing 3-5 steps with a railing? : A Little 6 Click Score: 15    End of Session Equipment Utilized During Treatment: Gait belt Activity Tolerance: Patient tolerated treatment well;Patient limited by fatigue;Patient limited by pain Patient left: with call bell/phone within reach;with family/visitor present;in bed Nurse Communication: Mobility status PT Visit Diagnosis: Other abnormalities of gait and mobility (R26.89);Pain Pain - Right/Left: Right Pain - part of body: Knee     Time: 1210-1238 PT Time Calculation (min) (ACUTE ONLY): 28 min  Charges:  $Gait Training: 8-22 mins $Therapeutic Exercise: 8-22 mins $Therapeutic Activity: 8-22 mins                     Debe Coder PT Acute Rehabilitation Services Pager [phone] Office [phone]    [name] 12/31/2020, 1:09 PM

## 2020-12-31 NOTE — Progress Notes (Signed)
Patient ID: [name], female   DOB: [birth year], 60 y.o.   MRN: [id number] Pain well controlled today.  She is somewhat frustrated about disposition as she is worked with physical therapy but unable to necessarily clear for home.  Her heart rate is normal.  Her right knee is stable.  She does have a lift chair at home as well as direct support from her husband with 24-hour support.  At this time I would recommend that we cement a plan today either for home ideally or for nursing home so as to minimize her additional inpatient stay.  She will be seen by physical therapy this morning for further assessment and hopefully plan for dispo definitively.

## 2021-01-02 ENCOUNTER — Telehealth: Payer: Self-pay | Admitting: Orthopaedic Surgery

## 2021-01-02 NOTE — Telephone Encounter (Signed)
Pt called stating she had surgery 12/23/20 and was supposed to be set up with home PT. She states when she called [facility] to see when they would be coming out they told her they had no orders. Pt would like to have orders sent so she can start her PT asap; pt would like a CB to be updated when this is done please.   [phone]

## 2021-01-02 NOTE — Telephone Encounter (Signed)
Checking with Jerry/[facility]

## 2021-01-02 NOTE — Telephone Encounter (Signed)
Patient aware per [facility] they are coming out today

## 2021-01-03 ENCOUNTER — Emergency Department (HOSPITAL_BASED_OUTPATIENT_CLINIC_OR_DEPARTMENT_OTHER): Payer: No Typology Code available for payment source

## 2021-01-03 ENCOUNTER — Telehealth: Payer: Self-pay | Admitting: Radiology

## 2021-01-03 ENCOUNTER — Emergency Department (HOSPITAL_COMMUNITY): Payer: No Typology Code available for payment source

## 2021-01-03 ENCOUNTER — Inpatient Hospital Stay (HOSPITAL_COMMUNITY)
Admission: EM | Admit: 2021-01-03 | Discharge: 2021-01-13 | DRG: 486 | Disposition: A | Discharge status: Home Health | Payer: No Typology Code available for payment source | Attending: Family Medicine | Admitting: Family Medicine

## 2021-01-03 ENCOUNTER — Other Ambulatory Visit: Payer: Self-pay

## 2021-01-03 ENCOUNTER — Encounter (HOSPITAL_COMMUNITY): Payer: Self-pay | Admitting: Emergency Medicine

## 2021-01-03 DIAGNOSIS — T8450XA Infection and inflammatory reaction due to unspecified internal joint prosthesis, initial encounter: Secondary | ICD-10-CM | POA: Diagnosis not present

## 2021-01-03 DIAGNOSIS — Z833 Family history of diabetes mellitus: Secondary | ICD-10-CM

## 2021-01-03 DIAGNOSIS — T8453XA Infection and inflammatory reaction due to internal right knee prosthesis, initial encounter: Principal | ICD-10-CM | POA: Diagnosis present

## 2021-01-03 DIAGNOSIS — Z86711 Personal history of pulmonary embolism: Secondary | ICD-10-CM | POA: Diagnosis not present

## 2021-01-03 DIAGNOSIS — Z96651 Presence of right artificial knee joint: Secondary | ICD-10-CM

## 2021-01-03 DIAGNOSIS — L03115 Cellulitis of right lower limb: Secondary | ICD-10-CM

## 2021-01-03 DIAGNOSIS — I951 Orthostatic hypotension: Secondary | ICD-10-CM | POA: Diagnosis present

## 2021-01-03 DIAGNOSIS — K219 Gastro-esophageal reflux disease without esophagitis: Secondary | ICD-10-CM | POA: Diagnosis present

## 2021-01-03 DIAGNOSIS — Z86718 Personal history of other venous thrombosis and embolism: Secondary | ICD-10-CM

## 2021-01-03 DIAGNOSIS — Z841 Family history of disorders of kidney and ureter: Secondary | ICD-10-CM

## 2021-01-03 DIAGNOSIS — G4733 Obstructive sleep apnea (adult) (pediatric): Secondary | ICD-10-CM | POA: Diagnosis present

## 2021-01-03 DIAGNOSIS — Z20822 Contact with and (suspected) exposure to covid-19: Secondary | ICD-10-CM | POA: Diagnosis present

## 2021-01-03 DIAGNOSIS — R609 Edema, unspecified: Secondary | ICD-10-CM

## 2021-01-03 DIAGNOSIS — I739 Peripheral vascular disease, unspecified: Secondary | ICD-10-CM | POA: Diagnosis present

## 2021-01-03 DIAGNOSIS — Z91041 Radiographic dye allergy status: Secondary | ICD-10-CM

## 2021-01-03 DIAGNOSIS — M79604 Pain in right leg: Secondary | ICD-10-CM

## 2021-01-03 DIAGNOSIS — I480 Paroxysmal atrial fibrillation: Secondary | ICD-10-CM | POA: Diagnosis present

## 2021-01-03 DIAGNOSIS — Z8249 Family history of ischemic heart disease and other diseases of the circulatory system: Secondary | ICD-10-CM

## 2021-01-03 DIAGNOSIS — Z5181 Encounter for therapeutic drug level monitoring: Secondary | ICD-10-CM

## 2021-01-03 DIAGNOSIS — M7989 Other specified soft tissue disorders: Secondary | ICD-10-CM

## 2021-01-03 DIAGNOSIS — R0682 Tachypnea, not elsewhere classified: Secondary | ICD-10-CM | POA: Diagnosis not present

## 2021-01-03 DIAGNOSIS — Z6841 Body Mass Index (BMI) 40.0 and over, adult: Secondary | ICD-10-CM | POA: Diagnosis not present

## 2021-01-03 DIAGNOSIS — E785 Hyperlipidemia, unspecified: Secondary | ICD-10-CM | POA: Diagnosis present

## 2021-01-03 DIAGNOSIS — B9562 Methicillin resistant Staphylococcus aureus infection as the cause of diseases classified elsewhere: Secondary | ICD-10-CM | POA: Diagnosis present

## 2021-01-03 DIAGNOSIS — Z79899 Other long term (current) drug therapy: Secondary | ICD-10-CM

## 2021-01-03 DIAGNOSIS — R0609 Other forms of dyspnea: Secondary | ICD-10-CM | POA: Diagnosis not present

## 2021-01-03 DIAGNOSIS — Z880 Allergy status to penicillin: Secondary | ICD-10-CM

## 2021-01-03 DIAGNOSIS — R0600 Dyspnea, unspecified: Secondary | ICD-10-CM | POA: Diagnosis present

## 2021-01-03 DIAGNOSIS — Y831 Surgical operation with implant of artificial internal device as the cause of abnormal reaction of the patient, or of later complication, without mention of misadventure at the time of the procedure: Secondary | ICD-10-CM | POA: Diagnosis present

## 2021-01-03 DIAGNOSIS — D62 Acute posthemorrhagic anemia: Secondary | ICD-10-CM | POA: Diagnosis not present

## 2021-01-03 DIAGNOSIS — Y792 Prosthetic and other implants, materials and accessory orthopedic devices associated with adverse incidents: Secondary | ICD-10-CM | POA: Diagnosis present

## 2021-01-03 DIAGNOSIS — Z7901 Long term (current) use of anticoagulants: Secondary | ICD-10-CM | POA: Diagnosis not present

## 2021-01-03 DIAGNOSIS — Z8261 Family history of arthritis: Secondary | ICD-10-CM | POA: Diagnosis not present

## 2021-01-03 DIAGNOSIS — Z881 Allergy status to other antibiotic agents status: Secondary | ICD-10-CM | POA: Diagnosis not present

## 2021-01-03 LAB — URINALYSIS, ROUTINE W REFLEX MICROSCOPIC
Bilirubin Urine: NEGATIVE
Glucose, UA: NEGATIVE mg/dL
Ketones, ur: NEGATIVE mg/dL
Nitrite: NEGATIVE
Protein, ur: NEGATIVE mg/dL
Specific Gravity, Urine: <1.005 — ABNORMAL LOW (ref 1.005–1.030)
pH: 6.5 (ref 5.0–8.0)

## 2021-01-03 LAB — CBC WITH DIFFERENTIAL/PLATELET
Abs Immature Granulocytes: 0.51 10*3/uL — ABNORMAL HIGH (ref 0.00–0.07)
Basophils Absolute: 0.1 10*3/uL (ref 0.0–0.1)
Basophils Relative: 1 %
Eosinophils Absolute: 0.2 10*3/uL (ref 0.0–0.5)
Eosinophils Relative: 2 %
HCT: 34.9 % — ABNORMAL LOW (ref 36.0–46.0)
Hemoglobin: 10.3 g/dL — ABNORMAL LOW (ref 12.0–15.0)
Immature Granulocytes: 6 %
Lymphocytes Relative: 12 %
Lymphs Abs: 1.1 10*3/uL (ref 0.7–4.0)
MCH: 24 pg — ABNORMAL LOW (ref 26.0–34.0)
MCHC: 29.5 g/dL — ABNORMAL LOW (ref 30.0–36.0)
MCV: 81.4 fL (ref 80.0–100.0)
Monocytes Absolute: 0.5 10*3/uL (ref 0.1–1.0)
Monocytes Relative: 5 %
Neutro Abs: 6.9 10*3/uL (ref 1.7–7.7)
Neutrophils Relative %: 74 %
Platelets: 368 10*3/uL (ref 150–400)
RBC: 4.29 MIL/uL (ref 3.87–5.11)
RDW: 18.3 % — ABNORMAL HIGH (ref 11.5–15.5)
WBC: 9.2 10*3/uL (ref 4.0–10.5)
nRBC: 0.2 % (ref 0.0–0.2)

## 2021-01-03 LAB — COMPREHENSIVE METABOLIC PANEL
ALT: 20 U/L (ref 0–44)
AST: 30 U/L (ref 15–41)
Albumin: 2.1 g/dL — ABNORMAL LOW (ref 3.5–5.0)
Alkaline Phosphatase: 55 U/L (ref 38–126)
Anion gap: 8 (ref 5–15)
BUN: 7 mg/dL — ABNORMAL LOW (ref 8–23)
CO2: 27 mmol/L (ref 22–32)
Calcium: 8.5 mg/dL — ABNORMAL LOW (ref 8.9–10.3)
Chloride: 102 mmol/L (ref 98–111)
Creatinine, Ser: 0.67 mg/dL (ref 0.44–1.00)
GFR, Estimated: >60 mL/min (ref >60)
Glucose, Bld: 98 mg/dL (ref 70–99)
Potassium: 4.3 mmol/L (ref 3.5–5.1)
Sodium: 137 mmol/L (ref 135–145)
Total Bilirubin: 0.7 mg/dL (ref 0.3–1.2)
Total Protein: 6.2 g/dL — ABNORMAL LOW (ref 6.5–8.1)

## 2021-01-03 LAB — TROPONIN I (HIGH SENSITIVITY)
Troponin I (High Sensitivity): 13 ng/L (ref <18)
Troponin I (High Sensitivity): 13 ng/L (ref <18)

## 2021-01-03 LAB — CK: Total CK: 142 U/L (ref 38–234)

## 2021-01-03 LAB — URINALYSIS, MICROSCOPIC (REFLEX)

## 2021-01-03 LAB — BRAIN NATRIURETIC PEPTIDE: B Natriuretic Peptide: 61.3 pg/mL (ref 0.0–100.0)

## 2021-01-03 LAB — RESP PANEL BY RT-PCR (FLU A&B, COVID) ARPGX2
Influenza A by PCR: NEGATIVE
Influenza B by PCR: NEGATIVE
SARS Coronavirus 2 by RT PCR: NEGATIVE

## 2021-01-03 MED ORDER — DIPHENHYDRAMINE HCL 50 MG/ML IJ SOLN: 50.0000 mg | Freq: Once | INTRAMUSCULAR | Status: DC

## 2021-01-03 MED ORDER — PREDNISONE 20 MG PO TABS
50.0000 mg | ORAL_TABLET | Freq: Four times a day (QID) | ORAL | Status: AC
  Administered 2021-01-03 – 2021-01-04 (×3): 50 mg via ORAL
  Filled 2021-01-03 (×2): qty 3

## 2021-01-03 MED ORDER — SULFAMETHOXAZOLE-TRIMETHOPRIM 800-160 MG PO TABS
1.0000 | ORAL_TABLET | Freq: Two times a day (BID) | ORAL | Status: DC
  Administered 2021-01-04 – 2021-01-05 (×3): 1 via ORAL
  Filled 2021-01-03 (×3): qty 1

## 2021-01-03 MED ORDER — SULFAMETHOXAZOLE-TRIMETHOPRIM 800-160 MG PO TABS
ORAL_TABLET | ORAL | Status: AC
  Administered 2021-01-03: 1 via ORAL
  Filled 2021-01-03: qty 1

## 2021-01-03 MED ORDER — ALBUTEROL SULFATE HFA 108 (90 BASE) MCG/ACT IN AERS: 2.0000 | INHALATION_SPRAY | RESPIRATORY_TRACT | Status: DC | PRN

## 2021-01-03 MED ORDER — LACTATED RINGERS IV SOLN: INTRAVENOUS | Status: DC

## 2021-01-03 MED ORDER — HYDROMORPHONE HCL 1 MG/ML IJ SOLN
1.0000 mg | INTRAMUSCULAR | Status: DC | PRN
  Administered 2021-01-06 – 2021-01-07 (×2): 1 mg via INTRAVENOUS
  Filled 2021-01-03 (×2): qty 1

## 2021-01-03 MED ORDER — DIPHENHYDRAMINE HCL 25 MG PO CAPS: 50.0000 mg | ORAL_CAPSULE | Freq: Once | ORAL | Status: DC

## 2021-01-03 MED ORDER — ADULT MULTIVITAMIN W/MINERALS CH
1.0000 | ORAL_TABLET | Freq: Every day | ORAL | Status: DC
  Administered 2021-01-04 – 2021-01-13 (×10): 1 via ORAL
  Filled 2021-01-03 (×10): qty 1

## 2021-01-03 MED ORDER — ACETAMINOPHEN 650 MG RE SUPP: 650.0000 mg | Freq: Four times a day (QID) | RECTAL | Status: DC | PRN

## 2021-01-03 MED ORDER — ACETAMINOPHEN 325 MG PO TABS: 650.0000 mg | ORAL_TABLET | Freq: Four times a day (QID) | ORAL | Status: DC | PRN

## 2021-01-03 MED ORDER — PREDNISONE 20 MG PO TABS
ORAL_TABLET | ORAL | Status: AC
  Filled 2021-01-03: qty 3

## 2021-01-03 MED ORDER — CLINDAMYCIN HCL 150 MG PO CAPS: 300.0000 mg | ORAL_CAPSULE | Freq: Once | ORAL | Status: DC

## 2021-01-03 MED ORDER — FESOTERODINE FUMARATE ER 4 MG PO TB24
4.0000 mg | ORAL_TABLET | Freq: Every day | ORAL | Status: DC
  Administered 2021-01-04 – 2021-01-13 (×10): 4 mg via ORAL
  Filled 2021-01-03 (×10): qty 1

## 2021-01-03 MED ORDER — PANTOPRAZOLE SODIUM 40 MG PO TBEC
40.0000 mg | DELAYED_RELEASE_TABLET | Freq: Every day | ORAL | Status: DC
  Administered 2021-01-04 – 2021-01-13 (×10): 40 mg via ORAL
  Filled 2021-01-03 (×10): qty 1

## 2021-01-03 MED ORDER — METHOCARBAMOL 500 MG PO TABS
500.0000 mg | ORAL_TABLET | Freq: Four times a day (QID) | ORAL | Status: DC | PRN
  Administered 2021-01-05 (×2): 500 mg via ORAL
  Filled 2021-01-03 (×3): qty 1

## 2021-01-03 MED ORDER — SENNOSIDES-DOCUSATE SODIUM 8.6-50 MG PO TABS: 1.0000 | ORAL_TABLET | Freq: Every evening | ORAL | Status: DC | PRN

## 2021-01-03 MED ORDER — HYDROCORTISONE SOD SUC (PF) 250 MG IJ SOLR
200.0000 mg | Freq: Once | INTRAMUSCULAR | Status: DC
  Filled 2021-01-03: qty 200

## 2021-01-03 MED ORDER — METOPROLOL SUCCINATE ER 50 MG PO TB24
50.0000 mg | ORAL_TABLET | Freq: Every day | ORAL | Status: DC
  Administered 2021-01-04 – 2021-01-13 (×10): 50 mg via ORAL
  Filled 2021-01-03 (×6): qty 1
  Filled 2021-01-03: qty 2
  Filled 2021-01-03 (×3): qty 1

## 2021-01-03 MED ORDER — APIXABAN 5 MG PO TABS
ORAL_TABLET | ORAL | Status: AC
  Administered 2021-01-03: 5 mg via ORAL
  Filled 2021-01-03: qty 1

## 2021-01-03 MED ORDER — HYDROCORTISONE SOD SUC (PF) 100 MG IJ SOLR
INTRAMUSCULAR | Status: AC
  Filled 2021-01-03: qty 4

## 2021-01-03 MED ORDER — OXYCODONE HCL 5 MG PO TABS
5.0000 mg | ORAL_TABLET | Freq: Four times a day (QID) | ORAL | Status: DC | PRN
Start: 2021-01-03 — End: 2021-01-13
  Administered 2021-01-05 (×2): 5 mg via ORAL
  Administered 2021-01-06 – 2021-01-08 (×6): 10 mg via ORAL
  Administered 2021-01-08: 5 mg via ORAL
  Administered 2021-01-09 – 2021-01-11 (×3): 10 mg via ORAL
  Filled 2021-01-03: qty 2
  Filled 2021-01-03 (×5): qty 1
  Filled 2021-01-03 (×7): qty 2

## 2021-01-03 MED ORDER — APIXABAN 5 MG PO TABS
5.0000 mg | ORAL_TABLET | Freq: Two times a day (BID) | ORAL | Status: DC
Start: 2021-01-03 — End: 2021-01-05
  Administered 2021-01-04 (×2): 5 mg via ORAL
  Filled 2021-01-03 (×2): qty 1

## 2021-01-03 MED ORDER — ALBUTEROL SULFATE (2.5 MG/3ML) 0.083% IN NEBU: 2.5000 mg | INHALATION_SOLUTION | RESPIRATORY_TRACT | Status: DC | PRN

## 2021-01-03 NOTE — H&P (Signed)
History and Physical    MATTILYNN FORRER CZY:[id number] DOB: 01-25-1961 DOA: 01/03/2021  PCP: Mateo Flow, MD   Patient coming from: Home  Chief Complaint: Dyspnea worsened by exertion  HPI: [name] is a 60 y.o. female with medical history significant for morbid obesity, PAF, OA, hx of PE with recent total knee replacement on right.  She underwent right total knee arthroplasty on December 24, 930 that was complicated by atrial fibrillation with RVR requiring prolonged hospitalization.  She has had significant pain and difficulty with mobility but did not want to go to rehab and was discharged home.  After going home she has had difficulty with mobility and ambulating due to her getting severe dyspnea with exertion.  She would have a rapid respiratory rate and felt she could not get enough air when she was trying to ambulate.  She also had episodes of orthostatic hypotension she reports at home.  She has not had any syncope.  She has developed redness around the incision site on her right knee.  She has had edema of her right knee leg ankle and foot since the surgery.  She reports having subjective chills but has not had any documented fever.  She does have a history of PE in the past and is on Xarelto for anticoagulation.  She reports has been compliant with the DOAC.  She has not had any falls or trauma to her knee since going home.  She denies change in appetite.  He has had no urinary frequency or dysuria. Lives with her husband.  Denies tobacco alcohol or illicit drug use  ED Course: She has been hemodynamically stable in the emergency room with tachypnea.  She is afebrile.  Ultrasound of her leg reveals no DVT.  Has been evaluated by orthopedic surgery in the emergency room and they will follow while patient is in the hospital.  WBC 9200 hemoglobin 10.3 hematocrit 34.9 platelets 368,000 sodium 137 potassium 4.3 chloride 102 bicarb 27 creatinine 0.67 BUN 7 glucose 98 alk phos is 55 AST  30 ALT 20 BNP 61.3 troponin 13 on 2 occasions in the ER.  COVID-negative.  Influenza a and B are negative.  UA is clear yellow with small leukocytes with 0-5 WBCs and few bacteria with 6-10 squamous epithelial cells.  Patient has no symptoms of UTI.  Chest x-ray is negative for infiltrate or consolidation or edema.  CT angiography of chest ordered by ER physician and patient is being pretreated with Benadryl and steroids as she has listed allergy to CT contrast.  ER physician was discussing with radiology prior to obtaining study.  Hospitalist service asked to admit for further management.  Review of Systems:  General: Denies fever, chills, weight loss, night sweats.  Denies dizziness.  Denies change in appetite HENT: Denies head trauma, headache, denies change in hearing, tinnitus.  Denies nasal congestion or bleeding.  Denies sore throat.  Denies difficulty swallowing Eyes: Denies blurry vision, pain in eye, drainage.  Denies discoloration of eyes. Neck: Denies pain.  Denies swelling.  Denies pain with movement. Cardiovascular: Denies chest pain, palpitations. Reports edema of legs-right more than left.  Denies orthopnea Respiratory: Reports shortness of breath. Denies cough. Denies wheezing.  Denies sputum production Gastrointestinal: Denies abdominal pain, swelling. Denies nausea, vomiting, diarrhea.  Denies melena.  Denies hematemesis. Musculoskeletal: Has pain in right knee. Has swelling of right leg/ankle/foot. Genitourinary: Denies pelvic pain.  Denies urinary frequency or hesitancy.  Denies dysuria.  Skin: Denies rash.  Denies  petechiae, purpura, ecchymosis. Neurological: Denies syncope. Denies seizure activity.  Denies paresthesia. Denies slurred speech, drooping face.  Denies visual change. Psychiatric: Denies depression, anxiety. Denies hallucinations.  Past Medical History:  Diagnosis Date   Anemia    Arthritis    Atrial fibrillation ([place])    Bleeding ulcer 12-2010  hospitalized for  1 week   Blood transfusion    Blood transfusion without reported diagnosis    2011-2 units transfused, bleeding ulcer   Bruises easily    CHF (congestive heart failure) ([place])    with PE resolved after    Complication of anesthesia    Constipation    Difficulty in swallowing 03/24/2013   no longer a problem   Diverticulitis    DVT (deep venous thrombosis) ([place]) 06/2013   right leg   Dysrhythmia     Hx of A fib    GERD (gastroesophageal reflux disease)    Hyperlipidemia    Leg swelling 03/24/2013   not a problem any longer   Osteoarthritis    Peripheral vascular disease ([place])    Pre-diabetes    Pulmonary embolus ([place]) 06/2013   Reflux    Shortness of breath    with PE   Vomiting 03/24/2013   problem resolved after lap band revision    Past Surgical History:  Procedure Laterality Date   APPENDECTOMY     CARPEL TUNNEL BILATEREAL     CHOLECYSTECTOMY     5'12   COLONOSCOPY N/A 04/10/2013   Procedure: COLONOSCOPY;  Surgeon: Beryle Beams, MD;  Location: [facility];  Service: Endoscopy;  Laterality: N/A;   DENTAL SURGERY  10/2019   tooth implant   DIAGNOSTIC LAPAROSCOPY     ENDOVENOUS ABLATION SAPHENOUS VEIN W/ LASER  06/2008   ENDOVENOUS ABLATION SAPHENOUS VEIN W/ LASER Left 12/09/2013   EVLA LEFT GREATER SAPHENOUS VEIN BY TODD EARLY MD   ESOPHAGOGASTRODUODENOSCOPY  12/18/2010   Procedure: ESOPHAGOGASTRODUODENOSCOPY (EGD);  Surgeon: Gatha Mayer, MD;  Location: [facility];  Service: Endoscopy;  Laterality: N/A;   JOINT REPLACEMENT Left    knee   LAPAROSCOPIC GASTRIC BANDING  01/05/2008   LAPAROSCOPIC GASTRIC BANDING  12/18/2010   01/04/2009   SALPHINGOOOPHORECTOMY Right    cyst removal   TONSILLECTOMY     TOTAL KNEE ARTHROPLASTY Left 07/31/2016   Procedure: LEFT TOTAL KNEE ARTHROPLASTY;  Surgeon: [name], MD;  Location: [facility];  Service: Orthopedics;  Laterality: Left;   TOTAL KNEE ARTHROPLASTY Right 12/23/2020   Procedure: RIGHT TOTAL KNEE  ARTHROPLASTY;  Surgeon: [name], MD;  Location: [facility];  Service: Orthopedics;  Laterality: Right;   WEDGE RESECTION OF OVARY  LEFT OVARY     Social History  reports that she has never smoked. She has never used smokeless tobacco. She reports that she does not drink alcohol and does not use drugs.  Allergies  Allergen Reactions   Contrast Media [Iodinated Diagnostic Agents] Shortness Of Breath and Other (See Comments)    APNEA   Penicillins Itching and Rash    PATIENT HAD A PCN REACTION WITH IMMEDIATE RASH, FACIAL/TONGUE/THROAT SWELLING, SOB, OR LIGHTHEADEDNESS WITH HYPOTENSION:  #  #  #  YES  #  #  #   Has patient had a PCN reaction causing severe rash involving mucus membranes or skin necrosis: NO Has patient had a PCN reaction that required hospitalization NO Has patient had a PCN reaction occurring within the last 10 years: NO   Ancef [Cefazolin Sodium] Nausea And Vomiting  Tolerates IV    Family History  Problem Relation Age of Onset   Heart disease Father    Kidney disease Father    Peripheral vascular disease Father    Heart attack Father        40   Diabetes Mother    Arthritis Mother      Prior to Admission medications   Medication Sig Start Date End Date Taking? Authorizing Provider  hydroxychloroquine (PLAQUENIL) 200 MG tablet TAKE 1 TABLET BY MOUTH TWICE A DAY Patient taking differently: Take 200 mg by mouth 2 (two) times daily. 12/06/20  Yes Ofilia Neas, PA-C  methocarbamol (ROBAXIN) 500 MG tablet Take 1 tablet (500 mg total) by mouth every 6 (six) hours as needed for muscle spasms. 12/24/20  Yes [name], MD  metoprolol succinate (TOPROL-XL) 50 MG 24 hr tablet Take 1 tablet (50 mg total) by mouth daily. Take with or immediately following a meal. 12/31/20  Yes [name], MD  Multiple Vitamin (MULTIVITAMIN) tablet Take 1 tablet by mouth daily.   Yes [provider]  nitrofurantoin, macrocrystal-monohydrate,  (MACROBID) 100 MG capsule Take 100 mg by mouth daily as needed (UTI). 07/20/20  Yes [provider]  omeprazole (PRILOSEC) 20 MG capsule Take 20 mg by mouth 2 (two) times daily. 12/28/20  Yes [provider]  oxyCODONE (OXY IR/ROXICODONE) 5 MG immediate release tablet Take 1-2 tablets (5-10 mg total) by mouth every 4 (four) hours as needed for moderate pain (pain score 4-6). 12/24/20  Yes [name], MD  rivaroxaban (XARELTO) 20 MG TABS tablet Take 20 mg by mouth daily.    Yes [provider]  tolterodine (DETROL LA) 2 MG 24 hr capsule Take 2 mg by mouth daily. 08/12/20  Yes [provider]  metoprolol succinate (TOPROL-XL) 25 MG 24 hr tablet Take 1 tablet (25 mg total) by mouth daily. Patient not taking: Reported on 01/03/2021 10/28/20   Nahser, Wonda Cheng, MD    Physical Exam: Vitals:   01/03/21 1915 01/03/21 1930 01/03/21 2000 01/03/21 2130  BP: (!) 115/48 98/75  135/66  Pulse: 83 84  79  Resp: (!) 30 (!) 33  13  Temp:      TempSrc:      SpO2: 100% 97% 100% 97%    Constitutional: NAD, calm, comfortable Vitals:   01/03/21 1915 01/03/21 1930 01/03/21 2000 01/03/21 2130  BP: (!) 115/48 98/75  135/66  Pulse: 83 84  79  Resp: (!) 30 (!) 33  13  Temp:      TempSrc:      SpO2: 100% 97% 100% 97%   General: WDWN, Alert and oriented x3.  Eyes: EOMI, PERRL, conjunctivae normal.  Sclera nonicteric HENT:  [place]/AT, external ears normal.  Nares patent without epistasis.  Mucous membranes are moist.  Neck: Soft, normal range of motion, supple, no masses, no thyromegaly.  Trachea midline Respiratory: clear to auscultation bilaterally, no wheezing, no crackles. Normal respiratory effort. No accessory muscle use.  Cardiovascular: Regular rate and rhythm, no murmurs / rubs / gallops. Has lower extremity edema. 1+ pedal pulses.   Abdomen: Soft, no tenderness, nondistended, no rebound or guarding.  No masses palpated. Obese. Bowel sounds  normoactive Musculoskeletal: FROM of upper and left lower extremities. Right knee with surgical dressing in place. no cyanosis. No joint deformity upper and lower extremities. no contractures. Normal muscle tone.  Skin: Warm, dry, intact no rashes, ulcers. Has erythema of right knee surrounding surgical dressing with mild  induration,  No drainage or fluctuation Neurologic: CN 2-12 grossly intact.  Normal speech.  Sensation intact to touch. Strength 5/5 in all extremities.   Psychiatric: Normal judgment and insight.  Normal mood.    Labs on Admission: I have personally reviewed following labs and imaging studies  CBC: Recent Labs  Lab 12/28/20 0244 12/30/20 0901 01/03/21 1012  WBC 7.3 7.2 9.2  NEUTROABS 5.1  --  6.9  HGB 11.0* 9.9* 10.3*  HCT 38.2 32.9* 34.9*  MCV 84.5 81.4 81.4  PLT 183 209 841    Basic Metabolic Panel: Recent Labs  Lab 12/28/20 0244 01/03/21 1012  NA 132* 137  K 4.1 4.3  CL 98 102  CO2 26 27  GLUCOSE 88 98  BUN 10 7*  CREATININE 0.64 0.67  CALCIUM 8.0* 8.5*    GFR: Estimated Creatinine Clearance: 108 mL/min (by C-G formula based on SCr of 0.67 mg/dL).  Liver Function Tests: Recent Labs  Lab 01/03/21 1012  AST 30  ALT 20  ALKPHOS 55  BILITOT 0.7  PROT 6.2*  ALBUMIN 2.1*    Urine analysis:    Component Value Date/Time   COLORURINE YELLOW 01/03/2021 1818   APPEARANCEUR CLEAR 01/03/2021 1818   LABSPEC <1.005 (L) 01/03/2021 1818   PHURINE 6.5 01/03/2021 1818   GLUCOSEU NEGATIVE 01/03/2021 1818   GLUCOSEU NEGATIVE 09/04/2013 1605   HGBUR TRACE (A) 01/03/2021 1818   BILIRUBINUR NEGATIVE 01/03/2021 [place] 01/03/2021 1818   PROTEINUR NEGATIVE 01/03/2021 1818   UROBILINOGEN 1.0 09/04/2013 1605   NITRITE NEGATIVE 01/03/2021 1818   LEUKOCYTESUR SMALL (A) 01/03/2021 1818    Radiological Exams on Admission: DG Chest 2 View  Result Date: 01/03/2021 CLINICAL DATA:  Shortness of breath.  Recent surgery. EXAM: CHEST - 2  VIEW COMPARISON:  08/30/2016 FINDINGS: The heart size and mediastinal contours are within normal limits. Both lungs are clear. The visualized skeletal structures are unremarkable. IMPRESSION: No active cardiopulmonary disease. Electronically Signed   By: Kerby Moors M.D.   On: 01/03/2021 13:59   VAS [place] LOWER EXTREMITY VENOUS (DVT) (7a-7p)  Result Date: 01/03/2021  Lower Venous DVT Study Patient Name:  GIADA SCHOPPE  Date of Exam:   01/03/2021 Medical Rec #: [id number]          Accession #:    [id number] Date of Birth: 03-Dec-1960           Patient Gender: F Patient Age:   36 years Exam Location:  [facility] Procedure:      VAS [place] LOWER EXTREMITY VENOUS (DVT) Referring Phys: RILEY RANSOM --------------------------------------------------------------------------------  Indications: Pain, Swelling, and Edema.  Limitations: Poor ultrasound/tissue interface and body habitus. Comparison Study: 08/07/20 prior Performing Technologist: Archie Patten RVS  Examination Guidelines: A complete evaluation includes B-mode imaging, spectral Doppler, color Doppler, and power Doppler as needed of all accessible portions of each vessel. Bilateral testing is considered an integral part of a complete examination. Limited examinations for reoccurring indications may be performed as noted. The reflux portion of the exam is performed with the patient in reverse Trendelenburg.  +---------+---------------+---------+-----------+----------+-------------------+ RIGHT    CompressibilityPhasicitySpontaneityPropertiesThrombus Aging      +---------+---------------+---------+-----------+----------+-------------------+ CFV      Full           Yes      Yes                                      +---------+---------------+---------+-----------+----------+-------------------+  SFJ      Full                                                              +---------+---------------+---------+-----------+----------+-------------------+ FV Prox  Full                                                             +---------+---------------+---------+-----------+----------+-------------------+ FV Mid                  Yes      Yes                                      +---------+---------------+---------+-----------+----------+-------------------+ FV Distal               Yes      Yes                                      +---------+---------------+---------+-----------+----------+-------------------+ PFV      Full                                                             +---------+---------------+---------+-----------+----------+-------------------+ POP      Full           Yes      Yes                                      +---------+---------------+---------+-----------+----------+-------------------+ PTV      Full                                                             +---------+---------------+---------+-----------+----------+-------------------+ PERO                                                  Not well visualized +---------+---------------+---------+-----------+----------+-------------------+   +----+---------------+---------+-----------+----------+--------------+ LEFTCompressibilityPhasicitySpontaneityPropertiesThrombus Aging +----+---------------+---------+-----------+----------+--------------+ CFV Full           Yes      Yes                                 +----+---------------+---------+-----------+----------+--------------+    Summary: RIGHT: - There is no evidence of deep vein thrombosis in the lower extremity. However, portions of this examination were limited- see technologist comments above.  -  No cystic structure found in the popliteal fossa.  LEFT: - No evidence of common femoral vein obstruction.  *See table(s) above for measurements and observations. Electronically signed by Harold Barban MD on 01/03/2021 at 7:02:43 PM.    Final     EKG: Independently reviewed.  EKG shows a normal sinus rhythm with incomplete right bundle branch block.  No acute ST elevation or depression.  QTc prolonged at 497  Assessment/Plan Principal Problem:   Dyspnea Ms. Buley is admitted to medical telemetry floor.  She has significant dyspnea that is worsened with exertion, he also has tachypnea.  She does have a history of PE and this is a concern with her recent total knee replacement.  She has been on Xarelto and states she is compliant with it. She has allergy to contrast dye and ER physician has ordered CTA of the chest and provided Benadryl and Solu-Medrol after discussion with radiology to obtain the test. Dimer is not ordered as it is expected to be significant elevated after total knee replacement and therefore would be ineffective in identifying potential PE. Albuterol MDI with AeroChamber as needed. Incentive spirometer every 2 hours while awake Doppler ultrasound of leg is negative for DVT  Active Problems:   Tachypnea Respiratory rate that is worsened with exertion.  She has not had hypoxemia    Cellulitis of right knee Patient with mild cellulitis surrounding dressing of knee incision.  No drainage noted.  Patient placed on Septra DS PO twice daily.  Antibiotic choice discussed with pharmacy who is in agreement with this antibiotic course at this time    Paroxysmal atrial fibrillation Continue metoprolol daily.  Patient currently normal sinus rhythm    Chronic anticoagulation Has history of PE in the past.  A recent total knee replacement.  Has been on Xarelto for anticoagulation.  With current clinical symptoms and concern for PE while on Xarelto will change to Eliquis.  If CT angiography shows an acute PE will use Eliquis 10 mg twice daily for 1 week then use 5 mg twice daily thereafter.  Patient placed on 5 mg twice daily now but will adjust if CTA chest is positive      Prolonged QT interval Avoid medications which could further prolong QT interval    Status post total right knee replacement Orthopedics following  DVT prophylaxis: She is anticoagulated on DOAC, was on Xarelto and changed to Eliquis after discussion with pharmacist  Code Status:   Full Code  Family Communication:  Diagnosis and plan discussed with patient.  She verbalized understanding and agrees with plan.  Further recommendations to follow as clinically indicated Disposition Plan:   Patient is from:  Home  Anticipated DC to:  Home versus short-term rehab, to be determined  Anticipated DC date:  Anticipate 2 midnight or more stay in the hospital  Consults called:  Orthopedics  Admission status:  Inpatient   Yevonne Aline Kariana Wiles MD [facility]  How to contact the [facility] Attending or Consulting provider [place] or covering provider during after hours 7P -7A, for this patient?   Check the care team in [facility] and look for a) attending/consulting [facility] provider listed and b) the [facility] team listed Log into [email or web address] and use [place]'s universal password to access. If you do not have the password, please contact the hospital operator. Locate the [facility] provider you are looking for under [facility] and page to a number that you can be directly reached. If you still have difficulty  reaching the provider, please page the [facility] (Director on Call) for the Hospitalists listed on amion for assistance.  01/03/2021, 10:15 PM

## 2021-01-03 NOTE — ED Provider Notes (Addendum)
Emergency Medicine Provider Triage Evaluation Note  [name] , a 60 y.o. female  was evaluated in triage.  Pt complains of R total knee replacement on11/18/22 d/c on 12/29/20 from [facility]. SOB and generalized weakness and drainage. Reported O2 sats got in the 70s at PT yesterday.  History of A. fib on Xarelto.  Reports compliancy.  Patient reports that during her stay at [facility] she had an episode of tachycardia with decreased oxygen saturation and was sent to cardiac ICU for an unknown reason.  They just upped her metoprolol per patient.  History of PE in the past.  Review of Systems  Positive: SOB, weakness, wound drainage, swelling Negative: Chest pain  Physical Exam  There were no vitals taken for this visit. Gen:   Awake, no distress   Resp:  Normal effort, clear to auscultation bilaterally MSK:   Drainage serosanguineous around the bandage, swelling to the right lower extremity with surrounding erythema from the wound.  Wound not visualized as there was a bandage covering. Other:    Medical Decision Making  Medically screening exam initiated at 9:32 AM.  Appropriate orders placed.  Yajayra Feldt Krapf was informed that the remainder of the evaluation will be completed by another provider, this initial triage assessment does not replace that evaluation, and the importance of remaining in the ED until their evaluation is complete.  Labs and imaging ordered.  Well ultrasound patient's leg for possible DVT.  Defer for CTA for now given patient's allergy.  Sherrell Puller, PA-C 01/03/21 0950    Sherrell Puller, PA-C 01/03/21 1005    Carmin Muskrat, MD 01/07/21 [id number]

## 2021-01-03 NOTE — ED Triage Notes (Addendum)
Pt to triage via [facility] from home.  R Knee replacement on 11/18.  C/o SOB, generalized weakness, lethargy, clear drainage from surgical site, and lower extremity edema.

## 2021-01-03 NOTE — ED Notes (Signed)
Notified RN of pt BP

## 2021-01-03 NOTE — Telephone Encounter (Signed)
Patient is status post TKA. States that she has continued to lose a lot of blood since surgery, she is having difficulty breathing, and is so weak that she cannot walk. She is going to call the ambulance to transport her to [facility] as she feels like she is dying. She wanted to make Dr. Ninfa Linden aware prior to her going to the hospital. Advised to go directly to ED.

## 2021-01-03 NOTE — ED Notes (Signed)
Pt was ambulated in her room with a walker assisted by this RN and an the tech. Pt's HR stayed between 90-100 and SpO2 within 94-100%.  Pt became tachypnic up to 35 breaths per minute during the ambulation as well as an increase in work of breathing. EMP aware

## 2021-01-03 NOTE — Progress Notes (Signed)
Patient ID: [name], female   DOB: [birth year], 60 y.o.   MRN: [id number] I know Erynn really well having just operated on her right knee a week and a half ago.  She had a right total knee arthroplasty 11 days ago on 12/23/2020.  She had a prolonged hospital course secondary to A. fib with RVR as well as significant pain and difficulty with mobility.  She had several episodes of orthostatic hypotension.  She was never significantly anemic during the hospitalization.  She was finally discharged on Sunday just wanted to go home and try to convalesce at home with her husband as well.  However, she has had a significant difficulty with mobility and ambulation and feeling short of breath.  She also developed some bloody drainage from her right knee incision.  She has not had any fever and chills.  She is on Xarelto chronically.  She presented to the emergency room today due to lethargy and feeling short of breath.  A Doppler ultrasound of the right lower extremity was negative for DVT.  I did assess her right knee and did not see anything significant worrisome.  There is some bloody drainage but no purulence.  This is mainly from the surgery itself.  At the inferior aspect of the incision there is some slight redness and I am not sure if this is just from hematoma and some of her dependent edema.  There may be a slight cellulitis associated with this.  She is currently afebrile and her peripheral white blood cell count is 9.2.  I believe she is being worked up for a possible UTI as well as making sure she does not have a PE.  From an orthopedic standpoint, I will make sure that I follow her close during this hospitalization and I am fine with any type of IV antibiotics to treat cellulitis of the right leg if warranted.  I will make sure to check on her first thing in the morning as well.  I did speak to her and her husband at the bedside as well.  I also change the dressing on her right knee.

## 2021-01-03 NOTE — ED Provider Notes (Signed)
[facility] EMERGENCY DEPARTMENT Provider Note   CSN: [id number] Arrival date & time: 01/03/21  6962     History No chief complaint on file.   [name] is a 60 y.o. female with previous DVT/PE, A. fib on Eliquis, postop day 11 status post right TKA by Dr. Ninfa Linden who presents emergency department for evaluation of exertional dyspnea and wound drainage.  Patient had an extended postoperative course after going into A. fib with RVR and ultimately was chemically cardioverted.  The patient trialed going home for home physical therapy but she has had significant difficulty with ambulation secondary to severe dyspnea with minimal exertion.  She also endorses bleeding from the wound incisional site.  Denies chest pain, abdominal pain, nausea, vomiting or other systemic symptoms.  Denies fever.  Patient has full range of motion at the knee.  HPI     Past Medical History:  Diagnosis Date   Anemia    Arthritis    Atrial fibrillation ([place])    Bleeding ulcer 12-2010  hospitalized for 1 week   Blood transfusion    Blood transfusion without reported diagnosis    2011-2 units transfused, bleeding ulcer   Bruises easily    CHF (congestive heart failure) ([place])    with PE resolved after    Complication of anesthesia    Constipation    Difficulty in swallowing 03/24/2013   no longer a problem   Diverticulitis    DVT (deep venous thrombosis) ([place]) 06/2013   right leg   Dysrhythmia     Hx of A fib    GERD (gastroesophageal reflux disease)    Hyperlipidemia    Leg swelling 03/24/2013   not a problem any longer   Osteoarthritis    Peripheral vascular disease ([place])    Pre-diabetes    Pulmonary embolus ([place]) 06/2013   Reflux    Shortness of breath    with PE   Vomiting 03/24/2013   problem resolved after lap band revision    Patient Active Problem List   Diagnosis Date Noted   Status post total right knee replacement 12/23/2020   Unilateral primary  osteoarthritis, right knee 04/07/2020   Leg edema 08/19/2018   Presence of left artificial knee joint 08/14/2016   Unilateral primary osteoarthritis, left knee 07/31/2016   Status post total knee replacement, left 07/31/2016   Abdominal hematoma    Post-operative state    Paroxysmal atrial fibrillation ([place])    Controlled type 2 diabetes mellitus with diabetic nephropathy ([place])    Wound infection 01/25/2016   Hydronephrosis, left 01/25/2016   Hydronephrosis, right 01/25/2016   DVT (deep venous thrombosis) ([place]) 01/25/2016   Atrial fibrillation ([place]) 01/25/2016   Diabetes mellitus with complication ([place])    Wound infection after surgery, initial encounter    Varicose veins of lower extremities with complications 95/28/4132   Pain and swelling of left lower leg 10/27/2013   Pain of left lower extremity 10/27/2013   Gastroenteritis, acute 09/08/2013   Fever, unspecified 09/04/2013   ASD (atrial septal defect) 08/13/2013   Pulmonary embolism ([place]) 06/22/2013   Dyspnea 06/11/2013   Anterior slip repaired Oct 2012 01/18/2011   GI bleed 12/18/2010   Diabetes mellitus ([place]) 12/18/2010   Varicose veins of lower extremities with other complications 44/02/270   Diabetes mellitus    HIP PAIN, RIGHT 03/06/2010    Past Surgical History:  Procedure Laterality Date   APPENDECTOMY     CARPEL TUNNEL BILATEREAL     CHOLECYSTECTOMY  8'33   COLONOSCOPY N/A 04/10/2013   Procedure: COLONOSCOPY;  Surgeon: Beryle Beams, MD;  Location: [facility];  Service: Endoscopy;  Laterality: N/A;   DENTAL SURGERY  10/2019   tooth implant   DIAGNOSTIC LAPAROSCOPY     ENDOVENOUS ABLATION SAPHENOUS VEIN W/ LASER  06/2008   ENDOVENOUS ABLATION SAPHENOUS VEIN W/ LASER Left 12/09/2013   EVLA LEFT GREATER SAPHENOUS VEIN BY TODD EARLY MD   ESOPHAGOGASTRODUODENOSCOPY  12/18/2010   Procedure: ESOPHAGOGASTRODUODENOSCOPY (EGD);  Surgeon: Gatha Mayer, MD;  Location: [facility];  Service: Endoscopy;   Laterality: N/A;   JOINT REPLACEMENT Left    knee   LAPAROSCOPIC GASTRIC BANDING  01/05/2008   LAPAROSCOPIC GASTRIC BANDING  12/18/2010   01/04/2009   SALPHINGOOOPHORECTOMY Right    cyst removal   TONSILLECTOMY     TOTAL KNEE ARTHROPLASTY Left 07/31/2016   Procedure: LEFT TOTAL KNEE ARTHROPLASTY;  Surgeon: [name], MD;  Location: [facility];  Service: Orthopedics;  Laterality: Left;   TOTAL KNEE ARTHROPLASTY Right 12/23/2020   Procedure: RIGHT TOTAL KNEE ARTHROPLASTY;  Surgeon: [name], MD;  Location: [facility];  Service: Orthopedics;  Laterality: Right;   WEDGE RESECTION OF OVARY  LEFT OVARY      OB History   No obstetric history on file.     Family History  Problem Relation Age of Onset   Heart disease Father    Kidney disease Father    Peripheral vascular disease Father    Heart attack Father        66   Diabetes Mother    Arthritis Mother     Social History   Tobacco Use   Smoking status: Never   Smokeless tobacco: Never  Vaping Use   Vaping Use: Never used  Substance Use Topics   Alcohol use: No    Alcohol/week: 0.0 standard drinks   Drug use: No    Home Medications Prior to Admission medications   Medication Sig Start Date End Date Taking? Authorizing Provider  hydroxychloroquine (PLAQUENIL) 200 MG tablet TAKE 1 TABLET BY MOUTH TWICE A DAY 12/06/20   Ofilia Neas, PA-C  methocarbamol (ROBAXIN) 500 MG tablet Take 1 tablet (500 mg total) by mouth every 6 (six) hours as needed for muscle spasms. 12/24/20   [name], MD  metoprolol succinate (TOPROL-XL) 25 MG 24 hr tablet Take 1 tablet (25 mg total) by mouth daily. 10/28/20   Nahser, Wonda Cheng, MD  metoprolol succinate (TOPROL-XL) 50 MG 24 hr tablet Take 1 tablet (50 mg total) by mouth daily. Take with or immediately following a meal. 12/31/20   [name], MD  Multiple Vitamin (MULTIVITAMIN) tablet Take 1 tablet by mouth daily.    [provider]   nitrofurantoin, macrocrystal-monohydrate, (MACROBID) 100 MG capsule Take 100 mg by mouth daily. 07/20/20   [provider]  omeprazole (PRILOSEC) 40 MG capsule Take 40 mg by mouth daily.    [provider]  oxyCODONE (OXY IR/ROXICODONE) 5 MG immediate release tablet Take 1-2 tablets (5-10 mg total) by mouth every 4 (four) hours as needed for moderate pain (pain score 4-6). 12/24/20   [name], MD  rivaroxaban (XARELTO) 20 MG TABS tablet Take 20 mg by mouth daily.     [provider]  tirzepatide Darcel Bayley) 2.5 MG/0.5ML Pen Inject 7.5 mg into the skin once a week.    [provider]  tolterodine (DETROL LA) 2 MG 24 hr capsule Take 2 mg by mouth  daily. 08/12/20   [provider]    Allergies    Contrast media [iodinated diagnostic agents], Penicillins, and Ancef [cefazolin sodium]  Review of Systems   Review of Systems  Constitutional:  Negative for chills and fever.  HENT:  Negative for ear pain and sore throat.   Eyes:  Negative for pain and visual disturbance.  Respiratory:  Positive for shortness of breath. Negative for cough.   Cardiovascular:  Negative for chest pain and palpitations.  Gastrointestinal:  Negative for abdominal pain and vomiting.  Genitourinary:  Negative for dysuria and hematuria.  Musculoskeletal:  Negative for arthralgias and back pain.  Skin:  Positive for wound. Negative for color change and rash.  Neurological:  Negative for seizures and syncope.  All other systems reviewed and are negative.  Physical Exam Updated Vital Signs BP 135/68 (BP Location: Right Arm)   Pulse 81   Temp 98.5 F (36.9 C) (Oral)   Resp 18   SpO2 100%   Physical Exam Vitals and nursing note reviewed.  Constitutional:      General: She is not in acute distress.    Appearance: She is well-developed.  HENT:     Head: Normocephalic and atraumatic.  Eyes:     Conjunctiva/sclera: Conjunctivae normal.  Cardiovascular:      Rate and Rhythm: Normal rate and regular rhythm.     Heart sounds: No murmur heard. Pulmonary:     Effort: Pulmonary effort is normal. No respiratory distress.     Breath sounds: Normal breath sounds.  Abdominal:     Palpations: Abdomen is soft.     Tenderness: There is no abdominal tenderness.  Musculoskeletal:        General: No swelling.     Cervical back: Neck supple.  Skin:    General: Skin is warm and dry.     Capillary Refill: Capillary refill takes less than 2 seconds.     Comments: Right TKA surgical wound clean dry and intact with mild seepage from the distal aspect of the wound, surrounding erythema approximately 1.5 cm  Neurological:     Mental Status: She is alert.  Psychiatric:        Mood and Affect: Mood normal.    ED Results / Procedures / Treatments   Labs (all labs ordered are listed, but only abnormal results are displayed) Labs Reviewed  COMPREHENSIVE METABOLIC PANEL - Abnormal; Notable for the following components:      Result Value   BUN 7 (*)    Calcium 8.5 (*)    Total Protein 6.2 (*)    Albumin 2.1 (*)    All other components within normal limits  CBC WITH DIFFERENTIAL/PLATELET - Abnormal; Notable for the following components:   Hemoglobin 10.3 (*)    HCT 34.9 (*)    MCH 24.0 (*)    MCHC 29.5 (*)    RDW 18.3 (*)    Abs Immature Granulocytes 0.51 (*)    All other components within normal limits  RESP PANEL BY RT-PCR (FLU A&B, COVID) ARPGX2  URINE CULTURE  BRAIN NATRIURETIC PEPTIDE  CK  URINALYSIS, ROUTINE W REFLEX MICROSCOPIC  TROPONIN I (HIGH SENSITIVITY)    EKG None  Radiology DG Chest 2 View  Result Date: 01/03/2021 CLINICAL DATA:  Shortness of breath.  Recent surgery. EXAM: CHEST - 2 VIEW COMPARISON:  08/30/2016 FINDINGS: The heart size and mediastinal contours are within normal limits. Both lungs are clear. The visualized skeletal structures are unremarkable. IMPRESSION: No active cardiopulmonary disease.  Electronically Signed   By:  Kerby Moors M.D.   On: 01/03/2021 13:59   VAS [place] LOWER EXTREMITY VENOUS (DVT) (7a-7p)  Result Date: 01/03/2021  Lower Venous DVT Study Patient Name:  KAEGAN STIGLER  Date of Exam:   01/03/2021 Medical Rec #: [id number]          Accession #:    [id number] Date of Birth: 1960/10/25           Patient Gender: F Patient Age:   29 years Exam Location:  [facility] Procedure:      VAS [place] LOWER EXTREMITY VENOUS (DVT) Referring Phys: RILEY RANSOM --------------------------------------------------------------------------------  Indications: Pain, Swelling, and Edema.  Limitations: Poor ultrasound/tissue interface and body habitus. Comparison Study: 08/07/20 prior Performing Technologist: Archie Patten RVS  Examination Guidelines: A complete evaluation includes B-mode imaging, spectral Doppler, color Doppler, and power Doppler as needed of all accessible portions of each vessel. Bilateral testing is considered an integral part of a complete examination. Limited examinations for reoccurring indications may be performed as noted. The reflux portion of the exam is performed with the patient in reverse Trendelenburg.  +---------+---------------+---------+-----------+----------+-------------------+ RIGHT    CompressibilityPhasicitySpontaneityPropertiesThrombus Aging      +---------+---------------+---------+-----------+----------+-------------------+ CFV      Full           Yes      Yes                                      +---------+---------------+---------+-----------+----------+-------------------+ SFJ      Full                                                             +---------+---------------+---------+-----------+----------+-------------------+ FV Prox  Full                                                             +---------+---------------+---------+-----------+----------+-------------------+ FV Mid                  Yes      Yes                                       +---------+---------------+---------+-----------+----------+-------------------+ FV Distal               Yes      Yes                                      +---------+---------------+---------+-----------+----------+-------------------+ PFV      Full                                                             +---------+---------------+---------+-----------+----------+-------------------+ POP  Full           Yes      Yes                                      +---------+---------------+---------+-----------+----------+-------------------+ PTV      Full                                                             +---------+---------------+---------+-----------+----------+-------------------+ PERO                                                  Not well visualized +---------+---------------+---------+-----------+----------+-------------------+   +----+---------------+---------+-----------+----------+--------------+ LEFTCompressibilityPhasicitySpontaneityPropertiesThrombus Aging +----+---------------+---------+-----------+----------+--------------+ CFV Full           Yes      Yes                                 +----+---------------+---------+-----------+----------+--------------+    Summary: RIGHT: - There is no evidence of deep vein thrombosis in the lower extremity. However, portions of this examination were limited- see technologist comments above.  - No cystic structure found in the popliteal fossa.  LEFT: - No evidence of common femoral vein obstruction.  *See table(s) above for measurements and observations.    Preliminary     Procedures Procedures   Medications Ordered in ED Medications - No data to display  ED Course  I have reviewed the triage vital signs and the nursing notes.  Pertinent labs & imaging results that were available during my care of the patient were reviewed by me and considered in my medical decision making (see chart for  details).    MDM Rules/Calculators/A&P                           Seen the emergency department for evaluation of exertional dyspnea and leakage from a right TKA wound.  Physical exam reveals mild seepage from the distal end of the incisional site as well as 1.5 cm of distal surrounding erythema.  She has full range of motion of the joints and I have lower suspicion for septic joint.  Cardiopulmonary exam unremarkable.  Laboratory evaluation is unremarkable with no leukocytosis.  High-sensitivity troponin unremarkable.  Orthopedics evaluated the patient at bedside and agreed to round on the patient tomorrow but does not believe the patient's tachypnea is coming from the wound and the wound is likely bleeding due to the patient's anticoagulant use.  Urinalysis unremarkable.  Walk of life performed and patient maintained her oxygen saturations but had significant tachypnea into the mid 30s and 40s on very minimal exertion.  There is concern for pulmonary embolism but the patient has apnea with contrast and thus will require a 13-hour protocol prior to receiving this.  She will be started on antibiotics for the cellulitis and admitted for a failed walk of life and persistent tachypnea on exertion. Final Clinical Impression(s) / ED Diagnoses Final diagnoses:  None    Rx / DC Orders ED Discharge Orders  None        Teressa Lower, MD 01/03/21 [phone]

## 2021-01-03 NOTE — Progress Notes (Signed)
Lower extremity venous has been completed.   Preliminary results in CV Proc.   Barbaraann Avans Damiean Lukes 01/03/2021 11:00 AM

## 2021-01-04 ENCOUNTER — Inpatient Hospital Stay (HOSPITAL_COMMUNITY): Payer: No Typology Code available for payment source

## 2021-01-04 ENCOUNTER — Other Ambulatory Visit (HOSPITAL_COMMUNITY): Payer: No Typology Code available for payment source

## 2021-01-04 ENCOUNTER — Other Ambulatory Visit (HOSPITAL_COMMUNITY): Payer: Self-pay

## 2021-01-04 ENCOUNTER — Encounter (HOSPITAL_COMMUNITY): Payer: Self-pay | Admitting: Family Medicine

## 2021-01-04 DIAGNOSIS — R0609 Other forms of dyspnea: Secondary | ICD-10-CM | POA: Diagnosis not present

## 2021-01-04 LAB — URINE CULTURE

## 2021-01-04 LAB — ECHOCARDIOGRAM COMPLETE
Area-P 1/2: 1.74 cm2
Calc EF: 50.6 %
S' Lateral: 3.5 cm
Single Plane A2C EF: 51.3 %
Single Plane A4C EF: 50.6 %

## 2021-01-04 LAB — BASIC METABOLIC PANEL
Anion gap: 7 (ref 5–15)
BUN: 6 mg/dL — ABNORMAL LOW (ref 8–23)
CO2: 24 mmol/L (ref 22–32)
Calcium: 8.3 mg/dL — ABNORMAL LOW (ref 8.9–10.3)
Chloride: 104 mmol/L (ref 98–111)
Creatinine, Ser: 0.6 mg/dL (ref 0.44–1.00)
GFR, Estimated: >60 mL/min (ref >60)
Glucose, Bld: 113 mg/dL — ABNORMAL HIGH (ref 70–99)
Potassium: 3.9 mmol/L (ref 3.5–5.1)
Sodium: 135 mmol/L (ref 135–145)

## 2021-01-04 LAB — CBC
HCT: 31.7 % — ABNORMAL LOW (ref 36.0–46.0)
Hemoglobin: 9.7 g/dL — ABNORMAL LOW (ref 12.0–15.0)
MCH: 24.9 pg — ABNORMAL LOW (ref 26.0–34.0)
MCHC: 30.6 g/dL (ref 30.0–36.0)
MCV: 81.3 fL (ref 80.0–100.0)
Platelets: 271 10*3/uL (ref 150–400)
RBC: 3.9 MIL/uL (ref 3.87–5.11)
RDW: 18.1 % — ABNORMAL HIGH (ref 11.5–15.5)
WBC: 8 10*3/uL (ref 4.0–10.5)
nRBC: 0 % (ref 0.0–0.2)

## 2021-01-04 LAB — HIV ANTIBODY (ROUTINE TESTING W REFLEX): HIV Screen 4th Generation wRfx: NONREACTIVE

## 2021-01-04 MED ORDER — ALBUTEROL SULFATE (2.5 MG/3ML) 0.083% IN NEBU: 2.5000 mg | INHALATION_SOLUTION | Freq: Four times a day (QID) | RESPIRATORY_TRACT | Status: DC | PRN

## 2021-01-04 MED ORDER — ALBUTEROL SULFATE (2.5 MG/3ML) 0.083% IN NEBU
2.5000 mg | INHALATION_SOLUTION | Freq: Four times a day (QID) | RESPIRATORY_TRACT | Status: DC
Start: 2021-01-04 — End: 2021-01-04
  Administered 2021-01-04: 2.5 mg via RESPIRATORY_TRACT
  Filled 2021-01-04: qty 3

## 2021-01-04 MED ORDER — IOHEXOL 350 MG/ML SOLN
100.0000 mL | Freq: Once | INTRAVENOUS | Status: AC | PRN
  Administered 2021-01-04: 100 mL via INTRAVENOUS

## 2021-01-04 MED ORDER — SACCHAROMYCES BOULARDII 250 MG PO CAPS
250.0000 mg | ORAL_CAPSULE | Freq: Two times a day (BID) | ORAL | Status: DC
  Administered 2021-01-04 – 2021-01-13 (×19): 250 mg via ORAL
  Filled 2021-01-04 (×20): qty 1

## 2021-01-04 MED ORDER — DIPHENHYDRAMINE HCL 50 MG/ML IJ SOLN: 50.0000 mg | Freq: Once | INTRAMUSCULAR | Status: AC

## 2021-01-04 MED ORDER — DIPHENHYDRAMINE HCL 25 MG PO CAPS
50.0000 mg | ORAL_CAPSULE | Freq: Once | ORAL | Status: AC
  Administered 2021-01-04: 50 mg via ORAL
  Filled 2021-01-04: qty 2

## 2021-01-04 MED ORDER — SODIUM CHLORIDE 0.9 % IV SOLN
2.0000 g | Freq: Once | INTRAVENOUS | Status: AC
  Administered 2021-01-04: 2 g via INTRAVENOUS
  Filled 2021-01-04: qty 20

## 2021-01-04 NOTE — Progress Notes (Signed)
CM received voicemail from [place], pt's case manager with [facility]. Margaretha Sheffield provided her contact information and requesting contact for any discharge needs.

## 2021-01-04 NOTE — ED Notes (Signed)
CT called and notified that the last prednisone medication will be given at 1030 on 11/30. CT notified that she will need to be scheduled for her CT about an hour after this administration

## 2021-01-04 NOTE — Progress Notes (Deleted)
CM informed bedside RN of communication/outreach with [facility]. CM awaiting call back.  Will continue to monitor.

## 2021-01-04 NOTE — ED Notes (Signed)
Attempted report 

## 2021-01-04 NOTE — ED Notes (Signed)
Breakfast order placed ?

## 2021-01-04 NOTE — Progress Notes (Signed)
  Echocardiogram 2D Echocardiogram has been performed.  Fidel Levy 01/04/2021, 4:00 PM

## 2021-01-04 NOTE — Progress Notes (Signed)
Patient ID: [name], female   DOB: 31-Mar-1960, 60 y.o.   MRN: [id number] I did just check on [name]'s knee at the bedside.  I was able to express bloody drainage from the inferior aspect of her incision.  The was no bad smell and no purulence.  There still is some slight redness at the inferior aspect of her incision.  It does look a little better to me than when I saw it last evening.  She is now on Bactrim DS.  She states that her knee does not hurt significantly bad.  She is afebrile and still has a normal peripheral WBC.  I would still like her to have a dose of IV antibiotic while she is in the ED.  I will order a dose of Rocephin.

## 2021-01-04 NOTE — Progress Notes (Signed)
PROGRESS NOTE    [name]  TIR:[id number] DOB: 04/11/1960 DOA: 01/03/2021 PCP: Mateo Flow, MD   Brief Narrative: 60 year old with past medical history significant for morbid obesity, PAF, OSA, history of PE with recent total knee replacement on the right.  She underwent right total knee arthroplasty in December 24, 6759 complicated by A. fib RVR requiring prolonged hospitalization.  She has had significant pain and difficulty with mobility but did not want to go to rehab and was discharged home.  She presented due to worsening shortness of breath on exertion.  She also had episode of orthostatic hypotension at home.  He has been taking Xarelto. She has been evaluated by orthopedic, who is recommending IV antibiotics for mild knee cellulitis. Underwent a CT angio chest which was negative for PE, Doppler lower extremity negative for DVT.  Assessment & Plan:   Principal Problem:   Dyspnea Active Problems:   Paroxysmal atrial fibrillation (HCC)   Status post total right knee replacement   Tachypnea   Cellulitis of right knee   Chronic anticoagulation   1-Dyspnea on exertion, Tachypnea: CTA negative for PE.  She was premedicated due to allergy to contrast. Continue with nebulizer Doppler lower extremity negative for DVT Echo pending No Significant drop in hemoglobin. EKG: Sinus rhythm. Troponin negative. Might benefit from rehab Schedule albuterol.   2-Paroxysmal A. fib: Continue with metoprolol.  EKG sinus rhythm. 3-Chronic anticoagulation; Xarelto was changed to Eliquis while we were waiting for CT result. Might be able to resume xarelto tomorrow.   4-s/p total knee replacement, mild superimposed cellulitis: She was a started on Bactrim. Dr. Ninfa Linden order IV ceftriaxone.  Estimated body mass index is 50.75 kg/m as calculated from the following:   Height as of 12/23/20: 5\' 6"  (1.676 m).   Weight as of 12/23/20: 142.6 kg.   DVT prophylaxis: eliquis Code Status:  Full code Family Communication: care discussed with patient.  Disposition Plan:  Status is: Inpatient  Remains inpatient appropriate because: Patient admitted with dyspnea on exertion, recent total knee replacement, might need placement.        Consultants:  Dr. Ninfa Linden Procedures:  ECHO  Antimicrobials:    Subjective: She had a transient chest pain episode this morning.  She denies dyspnea currently.  She reports shortness of breath on exertion.  Objective: Vitals:   01/04/21 0845 01/04/21 0900 01/04/21 1045 01/04/21 1400  BP: (!) 147/129 124/60 133/61 110/67  Pulse: 69 72 71 64  Resp: (!) 24 (!) 26 19 (!) 21  Temp:      TempSrc:      SpO2: 95% 95% 96% 95%   No intake or output data in the 24 hours ending 01/04/21 1653 There were no vitals filed for this visit.  Examination:  General exam: Appears calm and comfortable  Respiratory system: Clear to auscultation. Respiratory effort normal. Cardiovascular system: S1 & S2 heard, RRR. No JVD, murmurs, rubs, gallops or clicks. No pedal edema. Gastrointestinal system: Abdomen is nondistended, soft and nontender. No organomegaly or masses felt. Normal bowel sounds heard. Central nervous system: Alert and oriented.  Extremities: Symmetric 5 x 5 power.  Knee with dressing mild bloody   Data Reviewed: I have personally reviewed following labs and imaging studies  CBC: Recent Labs  Lab 12/30/20 0901 01/03/21 1012 01/04/21 0339  WBC 7.2 9.2 8.0  NEUTROABS  --  6.9  --   HGB 9.9* 10.3* 9.7*  HCT 32.9* 34.9* 31.7*  MCV 81.4 81.4 81.3  PLT 209  368 824   Basic Metabolic Panel: Recent Labs  Lab 01/03/21 1012 01/04/21 0339  NA 137 135  K 4.3 3.9  CL 102 104  CO2 27 24  GLUCOSE 98 113*  BUN 7* 6*  CREATININE 0.67 0.60  CALCIUM 8.5* 8.3*   GFR: Estimated Creatinine Clearance: 108 mL/min (by C-G formula based on SCr of 0.6 mg/dL). Liver Function Tests: Recent Labs  Lab 01/03/21 1012  AST 30  ALT 20   ALKPHOS 55  BILITOT 0.7  PROT 6.2*  ALBUMIN 2.1*   No results for input(s): LIPASE, AMYLASE in the last 168 hours. No results for input(s): AMMONIA in the last 168 hours. Coagulation Profile: No results for input(s): INR, PROTIME in the last 168 hours. Cardiac Enzymes: Recent Labs  Lab 01/03/21 1638  CKTOTAL 142   BNP (last 3 results) No results for input(s): PROBNP in the last 8760 hours. HbA1C: No results for input(s): HGBA1C in the last 72 hours. CBG: No results for input(s): GLUCAP in the last 168 hours. Lipid Profile: No results for input(s): CHOL, HDL, LDLCALC, TRIG, CHOLHDL, LDLDIRECT in the last 72 hours. Thyroid Function Tests: No results for input(s): TSH, T4TOTAL, FREET4, T3FREE, THYROIDAB in the last 72 hours. Anemia Panel: No results for input(s): VITAMINB12, FOLATE, FERRITIN, TIBC, IRON, RETICCTPCT in the last 72 hours. Sepsis Labs: No results for input(s): PROCALCITON, LATICACIDVEN in the last 168 hours.  Recent Results (from the past 240 hour(s))  Resp Panel by RT-PCR (Flu A&B, Covid) Nasopharyngeal Swab     Status: None   Collection Time: 01/03/21  4:10 PM   Specimen: Nasopharyngeal Swab; Nasopharyngeal(NP) swabs in vial transport medium  Result Value Ref Range Status   SARS Coronavirus 2 by RT PCR NEGATIVE NEGATIVE Final    Comment: (NOTE) SARS-CoV-2 target nucleic acids are NOT DETECTED.  The SARS-CoV-2 RNA is generally detectable in upper respiratory specimens during the acute phase of infection. The lowest concentration of SARS-CoV-2 viral copies this assay can detect is 138 copies/mL. A negative result does not preclude SARS-Cov-2 infection and should not be used as the sole basis for treatment or other patient management decisions. A negative result may occur with  improper specimen collection/handling, submission of specimen other than nasopharyngeal swab, presence of viral mutation(s) within the areas targeted by this assay, and inadequate  number of viral copies(<138 copies/mL). A negative result must be combined with clinical observations, patient history, and epidemiological information. The expected result is Negative.  Fact Sheet for Patients:  EntrepreneurPulse.com.au  Fact Sheet for Healthcare Providers:  IncredibleEmployment.be  This test is no t yet approved or cleared by the [place] FDA and  has been authorized for detection and/or diagnosis of SARS-CoV-2 by FDA under an Emergency Use Authorization (EUA). This EUA will remain  in effect (meaning this test can be used) for the duration of the COVID-19 declaration under Section 564(b)(1) of the Act, 21 U.S.C.section 360bbb-3(b)(1), unless the authorization is terminated  or revoked sooner.       Influenza A by PCR NEGATIVE NEGATIVE Final   Influenza B by PCR NEGATIVE NEGATIVE Final    Comment: (NOTE) The Xpert Xpress SARS-CoV-2/FLU/RSV plus assay is intended as an aid in the diagnosis of influenza from Nasopharyngeal swab specimens and should not be used as a sole basis for treatment. Nasal washings and aspirates are unacceptable for Xpert Xpress SARS-CoV-2/FLU/RSV testing.  Fact Sheet for Patients: EntrepreneurPulse.com.au  Fact Sheet for Healthcare Providers: IncredibleEmployment.be  This test is not yet approved or  cleared by the [place] and has been authorized for detection and/or diagnosis of SARS-CoV-2 by FDA under an Emergency Use Authorization (EUA). This EUA will remain in effect (meaning this test can be used) for the duration of the COVID-19 declaration under Section 564(b)(1) of the Act, 21 U.S.C. section 360bbb-3(b)(1), unless the authorization is terminated or revoked.  Performed at [facility], [place] [address], [place], [place] [address]   Urine Culture     Status: Abnormal   Collection Time: 01/03/21  6:19 PM   Specimen: Urine, Clean Catch   Result Value Ref Range Status   Specimen Description URINE, CLEAN CATCH  Final   Special Requests   Final    NONE Performed at [facility], [place] [address] [place], [place] [address]    Culture MULTIPLE SPECIES PRESENT, SUGGEST RECOLLECTION (A)  Final   Report Status 01/04/2021 FINAL  Final         Radiology Studies: DG Chest 2 View  Result Date: 01/03/2021 CLINICAL DATA:  Shortness of breath.  Recent surgery. EXAM: CHEST - 2 VIEW COMPARISON:  08/30/2016 FINDINGS: The heart size and mediastinal contours are within normal limits. Both lungs are clear. The visualized skeletal structures are unremarkable. IMPRESSION: No active cardiopulmonary disease. Electronically Signed   By: Kerby Moors M.D.   On: 01/03/2021 13:59   CT Angio Chest PE W and/or Wo Contrast  Result Date: 01/04/2021 CLINICAL DATA:  Concern for pulmonary embolus EXAM: CT ANGIOGRAPHY CHEST WITH CONTRAST TECHNIQUE: Multidetector CT imaging of the chest was performed using the standard protocol during bolus administration of intravenous contrast. Multiplanar CT image reconstructions and MIPs were obtained to evaluate the vascular anatomy. CONTRAST:  148mL OMNIPAQUE IOHEXOL 350 MG/ML SOLN COMPARISON:  CT chest angio dated August 31, 2016 FINDINGS: Cardiovascular: Somewhat suboptimal contrast opacification of the pulmonary arteries. Within limitations, there is no evidence of pulmonary embolus to the level of the segmental arteries. Normal heart size. No pericardial effusion. No significant coronary artery calcifications or significant atherosclerotic disease of thoracic aorta. Mediastinum/Nodes: Small hiatal hernia. Thyroid is unremarkable. No pathologically enlarged lymph nodes seen in the chest. Lungs/Pleura: Central airways are patent. Mild bilateral air trapping. Mild diffuse ground-glass opacities, compatible with atelectasis related to expiratory phase of imaging. No focal consolidation. No pneumothorax. Upper Abdomen:  Cholecystectomy clips. Gastric band noted. No acute abnormality. Musculoskeletal: No chest wall abnormality. No acute or significant osseous findings. Review of the MIP images confirms the above findings. IMPRESSION: 1. No evidence of pulmonary embolus to the level of the segmental arteries. 2. Mild bilateral air trapping, findings can be seen in the setting of small airways disease. 3. No acute intrathoracic process. Electronically Signed   By: Yetta Glassman M.D.   On: 01/04/2021 12:34   VAS [place] LOWER EXTREMITY VENOUS (DVT) (7a-7p)  Result Date: 01/03/2021  Lower Venous DVT Study Patient Name:  [name]  Date of Exam:   01/03/2021 Medical Rec #: [id number]          Accession #:    [id number] Date of Birth: 1960/06/29           Patient Gender: F Patient Age:   68 years Exam Location:  [facility] Procedure:      VAS [place] LOWER EXTREMITY VENOUS (DVT) Referring Phys: RILEY RANSOM --------------------------------------------------------------------------------  Indications: Pain, Swelling, and Edema.  Limitations: Poor ultrasound/tissue interface and body habitus. Comparison Study: 08/07/20 prior Performing Technologist: Archie Patten RVS  Examination Guidelines: A complete  evaluation includes B-mode imaging, spectral Doppler, color Doppler, and power Doppler as needed of all accessible portions of each vessel. Bilateral testing is considered an integral part of a complete examination. Limited examinations for reoccurring indications may be performed as noted. The reflux portion of the exam is performed with the patient in reverse Trendelenburg.  +---------+---------------+---------+-----------+----------+-------------------+ RIGHT    CompressibilityPhasicitySpontaneityPropertiesThrombus Aging      +---------+---------------+---------+-----------+----------+-------------------+ CFV      Full           Yes      Yes                                       +---------+---------------+---------+-----------+----------+-------------------+ SFJ      Full                                                             +---------+---------------+---------+-----------+----------+-------------------+ FV Prox  Full                                                             +---------+---------------+---------+-----------+----------+-------------------+ FV Mid                  Yes      Yes                                      +---------+---------------+---------+-----------+----------+-------------------+ FV Distal               Yes      Yes                                      +---------+---------------+---------+-----------+----------+-------------------+ PFV      Full                                                             +---------+---------------+---------+-----------+----------+-------------------+ POP      Full           Yes      Yes                                      +---------+---------------+---------+-----------+----------+-------------------+ PTV      Full                                                             +---------+---------------+---------+-----------+----------+-------------------+ PERO  Not well visualized +---------+---------------+---------+-----------+----------+-------------------+   +----+---------------+---------+-----------+----------+--------------+ LEFTCompressibilityPhasicitySpontaneityPropertiesThrombus Aging +----+---------------+---------+-----------+----------+--------------+ CFV Full           Yes      Yes                                 +----+---------------+---------+-----------+----------+--------------+    Summary: RIGHT: - There is no evidence of deep vein thrombosis in the lower extremity. However, portions of this examination were limited- see technologist comments above.  - No cystic structure found in the  popliteal fossa.  LEFT: - No evidence of common femoral vein obstruction.  *See table(s) above for measurements and observations. Electronically signed by Harold Barban MD on 01/03/2021 at 7:02:43 PM.    Final         Scheduled Meds:  apixaban  5 mg Oral BID   fesoterodine  4 mg Oral Daily   metoprolol succinate  50 mg Oral Daily   multivitamin with minerals  1 tablet Oral Daily   pantoprazole  40 mg Oral Daily   saccharomyces boulardii  250 mg Oral BID   sulfamethoxazole-trimethoprim  1 tablet Oral Q12H   Continuous Infusions:  lactated ringers 75 mL/hr at 01/03/21 2300     LOS: 1 day    Time spent: 35 minutes    Craig Ionescu A Clare Casto, MD [facility]   If 7PM-7AM, please contact night-coverage [email or web address]  01/04/2021, 4:53 PM

## 2021-01-05 ENCOUNTER — Encounter: Payer: No Typology Code available for payment source | Admitting: Orthopaedic Surgery

## 2021-01-05 DIAGNOSIS — R0609 Other forms of dyspnea: Secondary | ICD-10-CM | POA: Diagnosis not present

## 2021-01-05 LAB — CBC
HCT: 31.8 % — ABNORMAL LOW (ref 36.0–46.0)
Hemoglobin: 9.6 g/dL — ABNORMAL LOW (ref 12.0–15.0)
MCH: 24.4 pg — ABNORMAL LOW (ref 26.0–34.0)
MCHC: 30.2 g/dL (ref 30.0–36.0)
MCV: 80.7 fL (ref 80.0–100.0)
Platelets: 340 10*3/uL (ref 150–400)
RBC: 3.94 MIL/uL (ref 3.87–5.11)
RDW: 18.3 % — ABNORMAL HIGH (ref 11.5–15.5)
WBC: 10.4 10*3/uL (ref 4.0–10.5)
nRBC: 0 % (ref 0.0–0.2)

## 2021-01-05 MED ORDER — SODIUM CHLORIDE 0.9 % IV SOLN
2.0000 g | Freq: Three times a day (TID) | INTRAVENOUS | Status: DC
  Administered 2021-01-05 – 2021-01-07 (×6): 2 g via INTRAVENOUS
  Filled 2021-01-05 (×6): qty 2

## 2021-01-05 MED ORDER — VANCOMYCIN HCL 2000 MG/400ML IV SOLN
2000.0000 mg | INTRAVENOUS | Status: DC
  Administered 2021-01-07 – 2021-01-08 (×2): 2000 mg via INTRAVENOUS
  Filled 2021-01-05 (×4): qty 400

## 2021-01-05 MED ORDER — FOLIC ACID 1 MG PO TABS
1.0000 mg | ORAL_TABLET | Freq: Every day | ORAL | Status: DC
  Administered 2021-01-05 – 2021-01-13 (×9): 1 mg via ORAL
  Filled 2021-01-05 (×9): qty 1

## 2021-01-05 MED ORDER — RIVAROXABAN 10 MG PO TABS: 20.0000 mg | ORAL_TABLET | Freq: Every day | ORAL | Status: DC

## 2021-01-05 MED ORDER — FERROUS SULFATE 325 (65 FE) MG PO TABS
325.0000 mg | ORAL_TABLET | Freq: Every day | ORAL | Status: DC
  Administered 2021-01-07 – 2021-01-13 (×7): 325 mg via ORAL
  Filled 2021-01-05 (×7): qty 1

## 2021-01-05 MED ORDER — SODIUM CHLORIDE 0.9 % IV SOLN
2500.0000 mg | Freq: Once | INTRAVENOUS | Status: AC
  Administered 2021-01-05: 2500 mg via INTRAVENOUS
  Filled 2021-01-05: qty 2500

## 2021-01-05 NOTE — Progress Notes (Signed)
[place] for Xarelto Indication: atrial fibrillation, hx PE  Allergies  Allergen Reactions   Contrast Media [Iodinated Diagnostic Agents] Shortness Of Breath and Other (See Comments)    APNEA   Penicillins Itching and Rash    PATIENT HAD A PCN REACTION WITH IMMEDIATE RASH, FACIAL/TONGUE/THROAT SWELLING, SOB, OR LIGHTHEADEDNESS WITH HYPOTENSION:  #  #  #  YES  #  #  #   Has patient had a PCN reaction causing severe rash involving mucus membranes or skin necrosis: NO Has patient had a PCN reaction that required hospitalization NO Has patient had a PCN reaction occurring within the last 10 years: NO   Ancef [Cefazolin Sodium] Nausea And Vomiting    Tolerates IV    Patient Measurements:     Vital Signs: Temp: 97.9 F (36.6 C) (12/01 0639) Temp Source: Oral (12/01 0639) BP: 114/71 (12/01 0639) Pulse Rate: 62 (12/01 0639)  Labs: Recent Labs    01/03/21 1012 01/03/21 1638 01/03/21 2020 01/04/21 0339  HGB 10.3*  --   --  9.7*  HCT 34.9*  --   --  31.7*  PLT 368  --   --  271  CREATININE 0.67  --   --  0.60  CKTOTAL  --  142  --   --   TROPONINIHS  --  13 13  --     Estimated Creatinine Clearance: 108 mL/min (by C-G formula based on SCr of 0.6 mg/dL).   Medical History: Past Medical History:  Diagnosis Date   Anemia    Arthritis    Atrial fibrillation ([place])    Bleeding ulcer 12-2010  hospitalized for 1 week   Blood transfusion    Blood transfusion without reported diagnosis    2011-2 units transfused, bleeding ulcer   Bruises easily    CHF (congestive heart failure) ([place])    with PE resolved after    Complication of anesthesia    Constipation    Difficulty in swallowing 03/24/2013   no longer a problem   Diverticulitis    DVT (deep venous thrombosis) ([place]) 06/2013   right leg   Dysrhythmia     Hx of A fib    GERD (gastroesophageal reflux disease)    Hyperlipidemia    Leg swelling 03/24/2013   not a problem any  longer   Osteoarthritis    Peripheral vascular disease (HCC)    Pre-diabetes    Pulmonary embolus ([place]) 06/2013   Reflux    Shortness of breath    with PE   Vomiting 03/24/2013   problem resolved after lap band revision    Medications:  Medications Prior to Admission  Medication Sig Dispense Refill Last Dose   hydroxychloroquine (PLAQUENIL) 200 MG tablet TAKE 1 TABLET BY MOUTH TWICE A DAY (Patient taking differently: Take 200 mg by mouth 2 (two) times daily.) 180 tablet 0 01/03/2021 at unknown   methocarbamol (ROBAXIN) 500 MG tablet Take 1 tablet (500 mg total) by mouth every 6 (six) hours as needed for muscle spasms. 40 tablet 1 01/02/2021 at unknown   metoprolol succinate (TOPROL-XL) 50 MG 24 hr tablet Take 1 tablet (50 mg total) by mouth daily. Take with or immediately following a meal. 30 tablet 0 01/02/2021 at unknown   Multiple Vitamin (MULTIVITAMIN) tablet Take 1 tablet by mouth daily.   01/02/2021 at unknown   nitrofurantoin, macrocrystal-monohydrate, (MACROBID) 100 MG capsule Take 100 mg by mouth daily as needed (UTI).   01/02/2021 at unknown  omeprazole (PRILOSEC) 20 MG capsule Take 20 mg by mouth 2 (two) times daily.   01/02/2021 at unknown   oxyCODONE (OXY IR/ROXICODONE) 5 MG immediate release tablet Take 1-2 tablets (5-10 mg total) by mouth every 4 (four) hours as needed for moderate pain (pain score 4-6). 30 tablet 0 01/02/2021 at unknown   rivaroxaban (XARELTO) 20 MG TABS tablet Take 20 mg by mouth daily.    01/02/2021 at unknown   tolterodine (DETROL LA) 2 MG 24 hr capsule Take 2 mg by mouth daily.   01/02/2021 at unknown   metoprolol succinate (TOPROL-XL) 25 MG 24 hr tablet Take 1 tablet (25 mg total) by mouth daily. (Patient not taking: Reported on 01/03/2021) 90 tablet 3 Not Taking    Assessment: 60 yo F on Xarleto PTA for afib and hx PE.  Pt presented with shortness of breath and concern for recurrent PE.  Pt was switched to Eliquis pending CT scan / doppler results  to evaluate for Xarelto failure.  CT neg PE, doppler neg DVT.  Pharmacy has been asked to resume Xarelto.  Last dose of apixaban was 11/30 at 2200.  Goal of Therapy:  Therapeutic Anticoagulation Monitor platelets by anticoagulation protocol: Yes   Plan:  Xarelto 20mg  PO daily with supper.  [employer], Pharm.D., BCPS Clinical Pharmacist Clinical phone for 01/05/2021 from 7:30-3:00 is [phone].  **Pharmacist phone directory can be found on [email or web address] listed under [place].  01/05/2021 8:59 AM

## 2021-01-05 NOTE — Progress Notes (Signed)
New Admission Note:   Arrival Method:  stretcher Mental Orientation: alert x 4 Telemetry: 17 Assessment: Completed Skin: see flow sheet IV: NSL Pain: none Tubes: none Safety Measures: Safety Fall Prevention Plan has been discussed Admission: Completed [facility] Orientation: Patient has been orientated to the room, unit and staff.  Family: none at bedside  Orders have been reviewed and implemented. Will continue to monitor the patient. Call light has been placed within reach and bed alarm has been activated.   Rockie Neighbours BSN, RN Phone number: [phone]

## 2021-01-05 NOTE — Progress Notes (Signed)
Patient ID: [name], female   DOB: Dec 10, 1960, 60 y.o.   MRN: [id number] I am more concerned today now about the amount of drainage that is coming out of this patient's right knee.  I expressed a large hematoma again from the knee.  Given the abundant drainage she is now having from the right knee, we are obligated to pursue a surgical intervention.  I talked her in length about this and she agrees.  My plan will be to set her up for surgery late tomorrow afternoon for an irrigation and debridement of her right knee and possibly polyliner exchange.  At this point there is concerned about infection even though her labs are normal which is good and the redness has dissipated from her right knee, she is still having abundant copious drainage from that right knee and I believe it is medically necessary at this point to proceed with surgery tomorrow.  I will have her n.p.o. after midnight.  This need to be general anesthesia since she has been on blood thinning medication.  We will hold her blood thinners today and tomorrow.  She understands this fully.

## 2021-01-05 NOTE — Progress Notes (Signed)
PROGRESS NOTE    Sherri SKILTON  Ford:[id number] DOB: [birth year] DOA: 01/03/2021 PCP: Mateo Flow, MD   Brief Narrative: 60 year old with past medical history significant for morbid obesity, PAF, OSA, history of PE with recent total knee replacement on the right.  She underwent right total knee arthroplasty in December 23, 9560 complicated by A. fib RVR requiring prolonged hospitalization.  She has had significant pain and difficulty with mobility but did not want to go to rehab and was discharged home.  She presented due to worsening shortness of breath on exertion.  She also had episode of orthostatic hypotension at home.  He has been taking Xarelto. She has been evaluated by orthopedic, who is recommending IV antibiotics for mild knee cellulitis. Underwent a CT angio chest which was negative for PE, Doppler lower extremity negative for DVT.  Assessment & Plan:   Principal Problem:   Dyspnea Active Problems:   Paroxysmal atrial fibrillation (HCC)   Status post total right knee replacement   Tachypnea   Cellulitis of right knee   Chronic anticoagulation   1-Dyspnea on exertion, Tachypnea: CTA negative for PE.  She was premedicated due to allergy to contrast. Continue with nebulizer Doppler lower extremity negative for DVT Echo Normal EF, LVF.  No Significant drop in hemoglobin. EKG: Sinus rhythm. Troponin negative. Might benefit from rehab Schedule albuterol.  Suspect deconditioning. Oxygen sat normal on ambulation with OT>   2-Paroxysmal A. fib: Continue with metoprolol.  EKG sinus rhythm. Holding xarelto due to Right  knee hematoma.   3-Chronic anticoagulation; Holding xarelto due to knee hematoma, bleeding. Plan for Sx tomorrow.   4-s/p total Right  knee replacement, mild superimposed cellulitis: Plan to transition to IV antibiotics. Plan for irrigation and debridement of right knee 12/02 due to bleeding and concern for infection.   5-Anemia; monitor. Started ferrous  sulfate.   Estimated body mass index is 50.75 kg/m as calculated from the following:   Height as of 12/23/20: 5\' 6"  (1.676 m).   Weight as of 12/23/20: 142.6 kg.   DVT prophylaxis: SCD Code Status: Full code Family Communication: care discussed with patient.  Disposition Plan:  Status is: Inpatient  Remains inpatient appropriate because: Patient admitted with dyspnea on exertion, recent total knee replacement, might need placement.        Consultants:  Dr. Ninfa Linden Procedures:  ECHO  Antimicrobials:    Subjective: She is feeling ok, she started to bleed from right knee on standing and walking in the room with OT today. She has been bleeding at home as well.    Objective: Vitals:   01/04/21 2050 01/05/21 0130 01/05/21 0639 01/05/21 1007  BP:  120/68 114/71 (!) 144/78  Pulse:  62 62 65  Resp:  19 18 18   Temp:  98.1 F (36.7 C) 97.9 F (36.6 C) 98 F (36.7 C)  TempSrc:  Oral Oral   SpO2: 97% 100% 96% 100%    Intake/Output Summary (Last 24 hours) at 01/05/2021 1339 Last data filed at 01/05/2021 1238 Gross per 24 hour  Intake 720 ml  Output --  Net 720 ml   There were no vitals filed for this visit.  Examination:  General exam: NAD Respiratory system: CTA Cardiovascular system: S 1, S 2 RRR Gastrointestinal system: BS present, soft, nt Central nervous system: Alert Extremities: Right knee with bloody dressing.   Data Reviewed: I have personally reviewed following labs and imaging studies  CBC: Recent Labs  Lab 12/30/20 0901 01/03/21 1012 01/04/21 0339  01/05/21 0907  WBC 7.2 9.2 8.0 10.4  NEUTROABS  --  6.9  --   --   HGB 9.9* 10.3* 9.7* 9.6*  HCT 32.9* 34.9* 31.7* 31.8*  MCV 81.4 81.4 81.3 80.7  PLT 209 368 271 128    Basic Metabolic Panel: Recent Labs  Lab 01/03/21 1012 01/04/21 0339  NA 137 135  K 4.3 3.9  CL 102 104  CO2 27 24  GLUCOSE 98 113*  BUN 7* 6*  CREATININE 0.67 0.60  CALCIUM 8.5* 8.3*    GFR: Estimated Creatinine  Clearance: 108 mL/min (by C-G formula based on SCr of 0.6 mg/dL). Liver Function Tests: Recent Labs  Lab 01/03/21 1012  AST 30  ALT 20  ALKPHOS 55  BILITOT 0.7  PROT 6.2*  ALBUMIN 2.1*    No results for input(s): LIPASE, AMYLASE in the last 168 hours. No results for input(s): AMMONIA in the last 168 hours. Coagulation Profile: No results for input(s): INR, PROTIME in the last 168 hours. Cardiac Enzymes: Recent Labs  Lab 01/03/21 1638  CKTOTAL 142    BNP (last 3 results) No results for input(s): PROBNP in the last 8760 hours. HbA1C: No results for input(s): HGBA1C in the last 72 hours. CBG: No results for input(s): GLUCAP in the last 168 hours. Lipid Profile: No results for input(s): CHOL, HDL, LDLCALC, TRIG, CHOLHDL, LDLDIRECT in the last 72 hours. Thyroid Function Tests: No results for input(s): TSH, T4TOTAL, FREET4, T3FREE, THYROIDAB in the last 72 hours. Anemia Panel: No results for input(s): VITAMINB12, FOLATE, FERRITIN, TIBC, IRON, RETICCTPCT in the last 72 hours. Sepsis Labs: No results for input(s): PROCALCITON, LATICACIDVEN in the last 168 hours.  Recent Results (from the past 240 hour(s))  Resp Panel by RT-PCR (Flu A&B, Covid) Nasopharyngeal Swab     Status: None   Collection Time: 01/03/21  4:10 PM   Specimen: Nasopharyngeal Swab; Nasopharyngeal(NP) swabs in vial transport medium  Result Value Ref Range Status   SARS Coronavirus 2 by RT PCR NEGATIVE NEGATIVE Final    Comment: (NOTE) SARS-CoV-2 target nucleic acids are NOT DETECTED.  The SARS-CoV-2 RNA is generally detectable in upper respiratory specimens during the acute phase of infection. The lowest concentration of SARS-CoV-2 viral copies this assay can detect is 138 copies/mL. A negative result does not preclude SARS-Cov-2 infection and should not be used as the sole basis for treatment or other patient management decisions. A negative result may occur with  improper specimen collection/handling,  submission of specimen other than nasopharyngeal swab, presence of viral mutation(s) within the areas targeted by this assay, and inadequate number of viral copies(<138 copies/mL). A negative result must be combined with clinical observations, patient history, and epidemiological information. The expected result is Negative.  Fact Sheet for Patients:  EntrepreneurPulse.com.au  Fact Sheet for Healthcare Providers:  IncredibleEmployment.be  This test is no t yet approved or cleared by the [place] FDA and  has been authorized for detection and/or diagnosis of SARS-CoV-2 by FDA under an Emergency Use Authorization (EUA). This EUA will remain  in effect (meaning this test can be used) for the duration of the COVID-19 declaration under Section 564(b)(1) of the Act, 21 U.S.C.section 360bbb-3(b)(1), unless the authorization is terminated  or revoked sooner.       Influenza A by PCR NEGATIVE NEGATIVE Final   Influenza B by PCR NEGATIVE NEGATIVE Final    Comment: (NOTE) The Xpert Xpress SARS-CoV-2/FLU/RSV plus assay is intended as an aid in the diagnosis of influenza from  Nasopharyngeal swab specimens and should not be used as a sole basis for treatment. Nasal washings and aspirates are unacceptable for Xpert Xpress SARS-CoV-2/FLU/RSV testing.  Fact Sheet for Patients: EntrepreneurPulse.com.au  Fact Sheet for Healthcare Providers: IncredibleEmployment.be  This test is not yet approved or cleared by the [place] FDA and has been authorized for detection and/or diagnosis of SARS-CoV-2 by FDA under an Emergency Use Authorization (EUA). This EUA will remain in effect (meaning this test can be used) for the duration of the COVID-19 declaration under Section 564(b)(1) of the Act, 21 U.S.C. section 360bbb-3(b)(1), unless the authorization is terminated or revoked.  Performed at [facility], [place] [address] [place], [place] [address]   Urine Culture     Status: Abnormal   Collection Time: 01/03/21  6:19 PM   Specimen: Urine, Clean Catch  Result Value Ref Range Status   Specimen Description URINE, CLEAN CATCH  Final   Special Requests   Final    NONE Performed at [facility], [place] [address] [place], [place] [address]    Culture MULTIPLE SPECIES PRESENT, SUGGEST RECOLLECTION (A)  Final   Report Status 01/04/2021 FINAL  Final          Radiology Studies: DG Chest 2 View  Result Date: 01/03/2021 CLINICAL DATA:  Shortness of breath.  Recent surgery. EXAM: CHEST - 2 VIEW COMPARISON:  08/30/2016 FINDINGS: The heart size and mediastinal contours are within normal limits. Both lungs are clear. The visualized skeletal structures are unremarkable. IMPRESSION: No active cardiopulmonary disease. Electronically Signed   By: Kerby Moors M.D.   On: 01/03/2021 13:59   CT Angio Chest PE W and/or Wo Contrast  Result Date: 01/04/2021 CLINICAL DATA:  Concern for pulmonary embolus EXAM: CT ANGIOGRAPHY CHEST WITH CONTRAST TECHNIQUE: Multidetector CT imaging of the chest was performed using the standard protocol during bolus administration of intravenous contrast. Multiplanar CT image reconstructions and MIPs were obtained to evaluate the vascular anatomy. CONTRAST:  183mL OMNIPAQUE IOHEXOL 350 MG/ML SOLN COMPARISON:  CT chest angio dated August 31, 2016 FINDINGS: Cardiovascular: Somewhat suboptimal contrast opacification of the pulmonary arteries. Within limitations, there is no evidence of pulmonary embolus to the level of the segmental arteries. Normal heart size. No pericardial effusion. No significant coronary artery calcifications or significant atherosclerotic disease of thoracic aorta. Mediastinum/Nodes: Small hiatal hernia. Thyroid is unremarkable. No pathologically enlarged lymph nodes seen in the chest. Lungs/Pleura: Central airways are patent. Mild bilateral air trapping. Mild diffuse  ground-glass opacities, compatible with atelectasis related to expiratory phase of imaging. No focal consolidation. No pneumothorax. Upper Abdomen: Cholecystectomy clips. Gastric band noted. No acute abnormality. Musculoskeletal: No chest wall abnormality. No acute or significant osseous findings. Review of the MIP images confirms the above findings. IMPRESSION: 1. No evidence of pulmonary embolus to the level of the segmental arteries. 2. Mild bilateral air trapping, findings can be seen in the setting of small airways disease. 3. No acute intrathoracic process. Electronically Signed   By: Yetta Glassman M.D.   On: 01/04/2021 12:34   ECHOCARDIOGRAM COMPLETE  Result Date: 01/04/2021    ECHOCARDIOGRAM REPORT   Patient Name:   [name] Date of Exam: 01/04/2021 Medical Rec #:  [id number]         Height:       66.0 in Accession #:    [id number]        Weight:       314.4 lb Date of Birth:  [birth year]  BSA:          2.424 m Patient Age:    6 years          BP:           133/61 mmHg Patient Gender: F                 HR:           65 bpm. Exam Location:  Inpatient Procedure: 2D Echo, Cardiac Doppler and Color Doppler Indications:    Dyspnea  History:        Patient has prior history of Echocardiogram examinations, most                 recent 07/21/2018. CHF, Arrythmias:Atrial Fibrillation,                 Signs/Symptoms:Shortness of Breath; Risk Factors:Dyslipidemia.  Sonographer:    Bernadene Person RDCS Referring Phys: [id number] [place]  1. Left ventricular ejection fraction, by estimation, is 60 to 65%. The left ventricle has normal function. The left ventricle has no regional wall motion abnormalities. Left ventricular diastolic parameters were normal.  2. Right ventricular systolic function is normal. The right ventricular size is normal. There is normal pulmonary artery systolic pressure.  3. Left atrial size was mildly dilated.  4. The mitral valve is normal in structure. No  evidence of mitral valve regurgitation. No evidence of mitral stenosis.  5. The aortic valve is tricuspid. Aortic valve regurgitation is not visualized. Aortic valve sclerosis is present, with no evidence of aortic valve stenosis.  6. The inferior vena cava is normal in size with greater than 50% respiratory variability, suggesting right atrial pressure of 3 mmHg. Comparison(s): A prior study was performed on 07/21/2018. FINDINGS  Left Ventricle: Left ventricular ejection fraction, by estimation, is 60 to 65%. The left ventricle has normal function. The left ventricle has no regional wall motion abnormalities. The left ventricular internal cavity size was normal in size. There is  no left ventricular hypertrophy. Left ventricular diastolic parameters were normal. Right Ventricle: The right ventricular size is normal. No increase in right ventricular wall thickness. Right ventricular systolic function is normal. There is normal pulmonary artery systolic pressure. The tricuspid regurgitant velocity is 2.08 m/s, and  with an assumed right atrial pressure of 15 mmHg, the estimated right ventricular systolic pressure is 71.6 mmHg. Left Atrium: Left atrial size was mildly dilated. Right Atrium: Right atrial size was normal in size. Pericardium: There is no evidence of pericardial effusion. Mitral Valve: The mitral valve is normal in structure. Mild mitral annular calcification. No evidence of mitral valve regurgitation. No evidence of mitral valve stenosis. Tricuspid Valve: The tricuspid valve is normal in structure. Tricuspid valve regurgitation is not demonstrated. No evidence of tricuspid stenosis. Aortic Valve: The aortic valve is tricuspid. Aortic valve regurgitation is not visualized. Aortic valve sclerosis is present, with no evidence of aortic valve stenosis. Pulmonic Valve: The pulmonic valve was not well visualized. Pulmonic valve regurgitation is not visualized. No evidence of pulmonic stenosis. Aorta: The  aortic root is normal in size and structure. Venous: The inferior vena cava is normal in size with greater than 50% respiratory variability, suggesting right atrial pressure of 3 mmHg. IAS/Shunts: No atrial level shunt detected by color flow Doppler.  LEFT VENTRICLE PLAX 2D LVIDd:         5.00 cm      Diastology LVIDs:         3.50  cm      LV e' medial:    8.92 cm/s LV PW:         1.10 cm      LV E/e' medial:  8.3 LV IVS:        0.80 cm      LV e' lateral:   10.80 cm/s LVOT diam:     2.30 cm      LV E/e' lateral: 6.9 LV SV:         108 LV SV Index:   45 LVOT Area:     4.15 cm  LV Volumes (MOD) LV vol d, MOD A2C: 150.0 ml LV vol d, MOD A4C: 168.0 ml LV vol s, MOD A2C: 73.0 ml LV vol s, MOD A4C: 83.0 ml LV SV MOD A2C:     77.0 ml LV SV MOD A4C:     168.0 ml LV SV MOD BP:      81.1 ml RIGHT VENTRICLE RV S prime:     14.90 cm/s TAPSE (M-mode): 2.2 cm LEFT ATRIUM             Index        RIGHT ATRIUM           Index LA diam:        4.40 cm 1.82 cm/m   RA Area:     19.90 cm LA Vol (A2C):   74.4 ml 30.70 ml/m  RA Volume:   54.30 ml  22.40 ml/m LA Vol (A4C):   84.0 ml 34.66 ml/m LA Biplane Vol: 85.6 ml 35.32 ml/m  AORTIC VALVE LVOT Vmax:   113.00 cm/s LVOT Vmean:  72.700 cm/s LVOT VTI:    0.260 m  AORTA Ao Root diam: 3.60 cm Ao Asc diam:  3.50 cm MITRAL VALVE               TRICUSPID VALVE MV Area (PHT): 1.74 cm    TR Peak grad:   17.3 mmHg MV Decel Time: 436 msec    TR Vmax:        208.00 cm/s MV E velocity: 74.10 cm/s MV A velocity: 45.80 cm/s  SHUNTS MV E/A ratio:  1.62        Systemic VTI:  0.26 m                            Systemic Diam: 2.30 cm Kardie Tobb DO Electronically signed by Berniece Salines DO Signature Date/Time: 01/04/2021/6:37:57 PM    Final         Scheduled Meds:  [START ON 01/06/2021] ferrous sulfate  325 mg Oral Q breakfast   fesoterodine  4 mg Oral Daily   folic acid  1 mg Oral Daily   metoprolol succinate  50 mg Oral Daily   multivitamin with minerals  1 tablet Oral Daily   pantoprazole   40 mg Oral Daily   saccharomyces boulardii  250 mg Oral BID   Continuous Infusions:     LOS: 2 days    Time spent: 35 minutes    Elenor Wildes A Alexy Bringle, MD [facility]   If 7PM-7AM, please contact night-coverage [email or web address]  01/05/2021, 1:39 PM

## 2021-01-05 NOTE — Plan of Care (Signed)
  Problem: Education: Goal: Knowledge of General Education information will improve Description Including pain rating scale, medication(s)/side effects and non-pharmacologic comfort measures Outcome: Progressing   

## 2021-01-05 NOTE — Progress Notes (Signed)
Pharmacy Antibiotic Note  [name] is a 60 y.o. female admitted on 01/03/2021 with shortness of breath.  Hospital course has been complicated by cellulitis and possible wound infection  at previous R knee surgical site.  Patient is s/p R knee replacement 11/18.  Pharmacy has been consulted for Vancomycin and Cefepime dosing.  Plan for return to [place] for I&D.  Of note, pt was on PO Bactrim 11/29 >> 12/1.  Plan: Vancomycin 2500mg  IV x 1 dose Vancomycin 2000mg  IV q24h (calculated AUC 456, SCr 0.8, Vd 0.5) Cefepime 2gm IV q8h Follow-up culture data, clinical plans.  Vancomycin levels as indicated.     Temp (24hrs), Avg:98.1 F (36.7 C), Min:97.9 F (36.6 C), Max:98.3 F (36.8 C)  Recent Labs  Lab 12/30/20 0901 01/03/21 1012 01/04/21 0339 01/05/21 0907  WBC 7.2 9.2 8.0 10.4  CREATININE  --  0.67 0.60  --     Estimated Creatinine Clearance: 108 mL/min (by C-G formula based on SCr of 0.6 mg/dL).    Allergies  Allergen Reactions   Contrast Media [Iodinated Diagnostic Agents] Shortness Of Breath and Other (See Comments)    APNEA   Penicillins Itching and Rash    PATIENT HAD A PCN REACTION WITH IMMEDIATE RASH, FACIAL/TONGUE/THROAT SWELLING, SOB, OR LIGHTHEADEDNESS WITH HYPOTENSION:  #  #  #  YES  #  #  #   Has patient had a PCN reaction causing severe rash involving mucus membranes or skin necrosis: NO Has patient had a PCN reaction that required hospitalization NO Has patient had a PCN reaction occurring within the last 10 years: NO   Ancef [Cefazolin Sodium] Nausea And Vomiting    Tolerates IV    Antimicrobials this admission: Bactrim 11/29 >> 12/01 Vanc 12/1 >> Cefepime 12/1 >>  Dose adjustments this admission:   Microbiology results:   Thank you for allowing pharmacy to be a part of this patient's care.  [employer], Pharm.D., BCPS Clinical Pharmacist Clinical phone for 01/05/2021 from 7:30-3:00 is [phone].  **Pharmacist phone directory can be  found on [email or web address] listed under [place].  01/05/2021 3:32 PM

## 2021-01-05 NOTE — Consult Note (Signed)
WOC Nurse Consult Note: Patient receiving care in [facility] 5M19. Reason for Consult: DTPI Wound type: The patient has scattered linear purple/maroon colored lines vertically on the buttocks from sitting for prolonged periods of time.  The overlying tissue is intact. However, her biggest problem on the buttocks/coccyx/intergluteal cleft is MASD-ITD with fungal overgrowth Pressure Injury POA: Yes/No/NA Measurement: Wound bed: Drainage (amount, consistency, odor)  Periwound: Dressing procedure/placement/frequency: Wash buttocks, intergluteal cleft with soap and water, pat dry. Sprinkle antifungal powder (from clean utility room) over buttocks and into intergluteal cleft. Do NOT apply a sacral foam dressing.  Of note, when the patient stood with therapy prior to my arrival, the right knee surgical site drained serosanginous out from under the surgically placed dressing. For all questions that may arise from this situation, please direct them to her Orthopedic Surgeon.  She tells me he is aware of the drainage.  Monitor the wound area(s) for worsening of condition such as: Signs/symptoms of infection,  Increase in size,  Development of or worsening of odor, Development of pain, or increased pain at the affected locations.  Notify the medical team if any of these develop.  Thank you for the consult.  Discussed plan of care with the patient and bedside nurse.  [place] nurse will not follow at this time.  Please re-consult the [facility] team if needed.  Val Riles, RN, MSN, CWOCN, CNS-BC, pager [phone]

## 2021-01-05 NOTE — Evaluation (Signed)
Physical Therapy Evaluation Patient Details Name: [name] MRN: [id number] DOB: [birth year] Today's Date: 01/05/2021  History of Present Illness  60 y.o. female presents to [facility] hospital on 01/03/2021 with reports of SOB and DOE. Pt underwent R TKA 71/06, complicated by Afib with RVR. Pt admitted for management of Afib and RLE cellulitis. YIR:SWNIOE obesity, PAF, OA, hx of PE, anemia, CHF  Clinical Impression  Pt presents to PT with deficits in functional mobility, gait, balance, strength, power, endurance, and ROM. Pt with RLE swelling as well as bleeding from surgical site upon PT arrival, RN made aware. Pt currently is limited by weakness, fatigue, and pain, ambulating for very short distances with great effort. Pt benefits from assistance to maintain safety and reduce falls risk. Pt will benefit from continued aggressive mobilization to improve LE strength and aide in a return to independence.       Recommendations for follow up therapy are one component of a multi-disciplinary discharge planning process, led by the attending physician.  Recommendations may be updated based on patient status, additional functional criteria and insurance authorization.  Follow Up Recommendations Skilled nursing-short term rehab (<3 hours/day)    Assistance Recommended at Discharge Intermittent Supervision/Assistance  Functional Status Assessment Patient has had a recent decline in their functional status and demonstrates the ability to make significant improvements in function in a reasonable and predictable amount of time.  Equipment Recommendations       Recommendations for Other Services       Precautions / Restrictions Precautions Precautions: Fall;Knee Precaution Comments: no KI present, pt reports discontinuation of KI at home Knee Immobilizer - Right:  (Pt reports they no longer were using as was progression into flexion.) Restrictions Weight Bearing Restrictions: Yes RLE Weight Bearing:  Weight bearing as tolerated Other Position/Activity Restrictions: WBAT      Mobility  Bed Mobility Overal bed mobility: Needs Assistance Bed Mobility: Sit to Supine     Supine to sit: HOB elevated;Min guard (and bed rails) Sit to supine: Min assist   General bed mobility comments: assistance for RLE    Transfers Overall transfer level: Needs assistance Equipment used: Rolling walker (2 wheels) Transfers: Sit to/from Stand Sit to Stand: Min guard           General transfer comment: from recliner    Ambulation/Gait Ambulation/Gait assistance: Min guard Gait Distance (Feet): 15 Feet Assistive device: Rolling walker (2 wheels) Gait Pattern/deviations: Step-to pattern Gait velocity: reduced Gait velocity interpretation: <1.31 ft/sec, indicative of household ambulator   General Gait Details: pt with slowed step-to gait, reduced step length bilaterally with fatigue near end of walk  Stairs            Wheelchair Mobility    Modified Rankin (Stroke Patients Only)       Balance Overall balance assessment: Needs assistance Sitting-balance support: No upper extremity supported;Feet supported Sitting balance-Leahy Scale: Fair     Standing balance support: Bilateral upper extremity supported;Reliant on assistive device for balance Standing balance-Leahy Scale: Poor Standing balance comment: minG with BUE support of walker                             Pertinent Vitals/Pain Pain Assessment: Faces Faces Pain Scale: Hurts even more Breathing: normal Negative Vocalization: none Facial Expression: smiling or inexpressive Body Language: relaxed Consolability: no need to console PAINAD Score: 0 Pain Location: R knee Pain Descriptors / Indicators: Sore Pain Intervention(s): Monitored during session  Home Living Family/patient expects to be discharged to:: Private residence Living Arrangements: Spouse/significant other Available Help at Discharge:  Family Type of Home: House Home Access: [place] of Steps: 1 but now has a ramped entrance   Home Layout: One level Home Equipment: BSC/3in1;Cane - single point;Standard Walker;Toilet riser;Adaptive equipment;Wheelchair - manual Additional Comments: Pt works from home, Education officer, museum    Prior Function Prior Level of Function : Independent/Modified Independent;Working/employed       Physical Assist : ADLs (physical)   ADLs (physical): Bathing;Dressing;IADLs Mobility Comments: independent at baseline, limited to ambulation of ~50' at home since discharge after TKA ADLs Comments: Pt reports assist with starting undergarments, wears dresses rtypically and was mostly taking sponge baths but would like to take standard shower. Sleeps in a lift chair     Hand Dominance   Dominant Hand: Right    Extremity/Trunk Assessment   Upper Extremity Assessment Upper Extremity Assessment: Overall WFL for tasks assessed    Lower Extremity Assessment Lower Extremity Assessment: RLE deficits/detail RLE Deficits / Details: RLE pitting edema noted, bleeding from surgical site upon PT arrival, RN made aware. deferred ROM assessment due to pain and bleeding. No knee buckling noted in stance phase of gait    Cervical / Trunk Assessment Cervical / Trunk Assessment: Other exceptions Cervical / Trunk Exceptions: morbid obesity  Communication   Communication: No difficulties  Cognition Arousal/Alertness: Awake/alert Behavior During Therapy: WFL for tasks assessed/performed Overall Cognitive Status: Within Functional Limits for tasks assessed                                          General Comments General comments (skin integrity, edema, etc.): pt rhythm appears to be in A-fib, rate controlled in 60s. all other VSS    Exercises     Assessment/Plan    PT Assessment Patient needs continued PT services  PT Problem List Decreased  strength;Decreased activity tolerance;Decreased range of motion;Decreased balance;Decreased mobility;Pain       PT Treatment Interventions DME instruction;Gait training;Functional mobility training;Therapeutic activities;Therapeutic exercise;Balance training;Patient/family education;Neuromuscular re-education    PT Goals (Current goals can be found in the Care Plan section)  Acute Rehab PT Goals Patient Stated Goal: to return to independent ambulation PT Goal Formulation: With patient Time For Goal Achievement: 01/19/21 Potential to Achieve Goals: Fair    Frequency Min 4X/week   Barriers to discharge        Co-evaluation               AM-PAC PT "6 Clicks" Mobility  Outcome Measure Help needed turning from your back to your side while in a flat bed without using bedrails?: A Little Help needed moving from lying on your back to sitting on the side of a flat bed without using bedrails?: A Little Help needed moving to and from a bed to a chair (including a wheelchair)?: A Little Help needed standing up from a chair using your arms (e.g., wheelchair or bedside chair)?: A Little Help needed to walk in hospital room?: Total Help needed climbing 3-5 steps with a railing? : Total 6 Click Score: 14    End of Session   Activity Tolerance: Patient tolerated treatment well Patient left: in bed;with call bell/phone within reach Nurse Communication: Mobility status PT Visit Diagnosis: Other abnormalities of gait and mobility (R26.89);Pain Pain - Right/Left: Right Pain - part of body:  Knee    Time: 9357-0177 PT Time Calculation (min) (ACUTE ONLY): 20 min   Charges:   PT Evaluation $PT Eval Moderate Complexity: 1 Mod          Zenaida Niece, PT, DPT Acute Rehabilitation Pager: [phone] Office [phone]   Zenaida Niece 01/05/2021, 10:25 AM

## 2021-01-05 NOTE — TOC Initial Note (Signed)
Transition of Care [facility]) - Initial/Assessment Note    Patient Details  Name: [name] MRN: [id number] Date of Birth: 06-11-1960  [facility]) CM/SW Contact:    Milinda Antis, [place] Phone Number: 01/05/2021, 4:58 PM  Clinical Narrative:                 CSW received consult for possible SNF placement at time of discharge. CSW spoke with patient and patient's spouse at bedside.  Patient was alert and oriented x4 during this encounter.  Patient expressed understanding of PT recommendation and is agreeable to SNF placement at time of discharge. Patient reports preference for [facility] in [place].   The patient plans to return home with husband after leaving the SNF.  CSW discussed insurance authorization process and will provide Medicare SNF ratings list. Patient has received 2 COVID vaccines. Patient reports being scheduled to go back to OR tomorrow.    CSW will continue to follow.    Skilled Nursing Rehab Facilities-   [email or web address]   Ratings out of 5 possible   Name Address  Phone # [place] Ins[place] Overall  [facility] [address] [place] 5 5 2 4   [facility]  [address] [place], [place] [phone] 4 2 5 5   [facility] [place], [place] 4 1 1 1   [facility] [place], [place] 2 2 4 4   [facility] [address] [place] 2 1 1 1   [facility] [address], [place] 3 1 4 3   [facility] [address], [place] 5 2 2 3   [facility] [address], [place] 4 1 2 1   [facility] [place] 5 1 2 2   [facility] [phone] [address], [place] [phone] 4 1 1 1   [facility] [address], [facility] [phone] 4 1 2 1   [facility] [place], [place] [phone] 4 1 1 1             Patient Goals and CMS Choice        Expected Discharge Plan and Services                                                Prior Living Arrangements/Services                       Activities of Daily Living Home Assistive Devices/Equipment: Blood pressure cuff, Scales, Walker (specify type), Eyeglasses, Grab bars in shower, Hand-held shower hose, Bedside commode/3-in-1, Cane (specify quad or straight) ADL Screening (condition at time of admission) Patient's cognitive ability adequate to safely complete daily activities?: Yes Is the patient deaf or have difficulty hearing?: No Does the patient have difficulty seeing, even when wearing glasses/contacts?: No Does the patient have difficulty concentrating, remembering, or making decisions?: No Patient able to express need for assistance with ADLs?: Yes Does the patient have difficulty dressing or bathing?: No Independently performs ADLs?: Yes (appropriate for developmental age) Does the patient have difficulty walking or climbing stairs?: Yes Weakness of Legs: Right Weakness of Arms/Hands: None  Permission Sought/Granted                  Emotional  Assessment              Admission diagnosis:  Tachypnea [R06.82] Dyspnea on exertion [R06.09] Patient Active Problem List   Diagnosis Date Noted   Tachypnea 01/03/2021   Cellulitis of right knee 01/03/2021   Chronic anticoagulation 01/03/2021   Status post total right knee replacement 12/23/2020   Unilateral primary osteoarthritis, right knee 04/07/2020   Leg edema 08/19/2018   Presence of left artificial knee joint 08/14/2016   Unilateral primary osteoarthritis, left knee 07/31/2016   Status post total knee replacement, left 07/31/2016   Abdominal hematoma    Post-operative state    Paroxysmal atrial fibrillation ([place])    Controlled type 2 diabetes mellitus with diabetic nephropathy  ([place])    Wound infection 01/25/2016   Hydronephrosis, left 01/25/2016   Hydronephrosis, right 01/25/2016   DVT (deep venous thrombosis) ([place]) 01/25/2016   Atrial fibrillation ([place]) 01/25/2016   Diabetes mellitus with complication ([place])    Wound infection after surgery, initial encounter    Varicose veins of lower extremities with complications 65/46/5035   Pain and swelling of left lower leg 10/27/2013   Pain of left lower extremity 10/27/2013   Gastroenteritis, acute 09/08/2013   Fever, unspecified 09/04/2013   ASD (atrial septal defect) 08/13/2013   Pulmonary embolism ([place]) 06/22/2013   Dyspnea 06/11/2013   Anterior slip repaired Oct 2012 01/18/2011   GI bleed 12/18/2010   Diabetes mellitus ([place]) 12/18/2010   Varicose veins of lower extremities with other complications 46/56/8127   Diabetes mellitus    HIP PAIN, RIGHT 03/06/2010   PCP:  Mateo Flow, MD Pharmacy:   [id number] [place], [place] - [address] [place] [address] Phone: [phone] Fax: [phone]  [id number] [place], [place] [address] Phone: [phone] Fax: [phone]     Social Determinants of Health (SDOH) Interventions    Readmission Risk Interventions No flowsheet data found.

## 2021-01-05 NOTE — Evaluation (Signed)
Occupational Therapy Evaluation Patient Details Name: [name] MRN: [id number] DOB: March 31, 1960 Today's Date: 01/05/2021   History of Present Illness Pt is a 60 yr old who recently had a R TKA 31/54/00 and was complicated with afib and RVR. Pt then reported to have dyspena with exertion. QQP:YPPJKD obesity, PAF, OA, hx of PE, anemia, CHF   Clinical Impression   Pt was recently in the hospital and started therapy for one visit at home, ambulating about 50 ft in home level with FW, assist with LB dressing from husband IADLS. Pt at this time in session reported slight SOB but o2 readings on room air were 97-100%. Pt was able to complete bed mobility with min guard with HOB elevated and bed rails. Pt then completed sit to stand with min assist from elevated surfaces and min assist to min guard for about 5-8 steps but limited due to draining on RLE. Pt currently with functional limitations due to the deficits listed below (see OT Problem List).  Pt will benefit from skilled OT to increase their safety and independence with ADL and functional mobility for ADL to facilitate discharge to venue listed below.        Recommendations for follow up therapy are one component of a multi-disciplinary discharge planning process, led by the attending physician.  Recommendations may be updated based on patient status, additional functional criteria and insurance authorization.   Follow Up Recommendations  Skilled nursing-short term rehab (<3 hours/day) Pt reported if it takes to long to set up SNF they would like to go [facility] level of care.    Assistance Recommended at Discharge Frequent or constant Supervision/Assistance  Functional Status Assessment  Patient has had a recent decline in their functional status and demonstrates the ability to make significant improvements in function in a reasonable and predictable amount of time.  Equipment Recommendations  None recommended by OT    Recommendations for  Other Services       Precautions / Restrictions Precautions Precautions: Fall;Knee Knee Immobilizer - Right:  (Pt reports they no longer were using as was progression into flexion.) Restrictions Weight Bearing Restrictions: Yes RLE Weight Bearing: Weight bearing as tolerated Other Position/Activity Restrictions: WBAT      Mobility Bed Mobility Overal bed mobility: Needs Assistance Bed Mobility: Supine to Sit     Supine to sit: HOB elevated;Min guard (and bed rails)          Transfers Overall transfer level: Needs assistance Equipment used: Rolling walker (2 wheels) Transfers: Sit to/from Stand Sit to Stand: Min assist;From elevated surface           General transfer comment: Pt limited at this time into WB activities due to draining on RLE and nursing made aware      Balance Overall balance assessment: Needs assistance Sitting-balance support: No upper extremity supported Sitting balance-Leahy Scale: Good     Standing balance support: Bilateral upper extremity supported Standing balance-Leahy Scale: Poor Standing balance comment: Pt unable to complete unsupportive activity                           ADL either performed or assessed with clinical judgement   ADL Overall ADL's : Needs assistance/impaired Eating/Feeding: Independent   Grooming: Set up;Sitting   Upper Body Bathing: Set up;Sitting   Lower Body Bathing: Maximal assistance;Cueing for safety;Cueing for sequencing;Sit to/from stand   Upper Body Dressing : Set up;Sitting   Lower Body Dressing: Maximal assistance;Cueing for  safety;Cueing for sequencing;Sit to/from stand   Toilet Transfer: Min guard;Cueing for safety;Cueing for sequencing;BSC/3in1;Rolling walker (2 wheels)   Toileting- Water quality scientist and Hygiene: Cueing for safety;Cueing for sequencing;Total assistance;Sit to/from stand       Functional mobility during ADLs: Minimal assistance;Cueing for safety;Rolling walker  (2 wheels);Standard walker General ADL Comments: Pt limited br draining on R knee     Vision Patient Visual Report: No change from baseline       Perception     Praxis      Pertinent Vitals/Pain Pain Assessment: Faces Faces Pain Scale: Hurts little more Breathing: normal Negative Vocalization: none Facial Expression: smiling or inexpressive Body Language: relaxed Consolability: no need to console PAINAD Score: 0 Pain Location: R knee Pain Descriptors / Indicators: Aching;Sore Pain Intervention(s): Limited activity within patient's tolerance;Monitored during session;Repositioned     Hand Dominance Right   Extremity/Trunk Assessment Upper Extremity Assessment Upper Extremity Assessment: Overall WFL for tasks assessed   Lower Extremity Assessment Lower Extremity Assessment: Defer to PT evaluation       Communication Communication Communication: No difficulties   Cognition Arousal/Alertness: Awake/alert Behavior During Therapy: WFL for tasks assessed/performed Overall Cognitive Status: Within Functional Limits for tasks assessed                                       General Comments       Exercises     Shoulder Instructions      Home Living Family/patient expects to be discharged to:: Private residence Living Arrangements: Spouse/significant other Available Help at Discharge: Family Type of Home: House Home Access: Stairs to enter [employer] of Steps: 1 but now has a ramped entrance   Home Layout: One level     Bathroom Shower/Tub: Teacher, early years/pre: Handicapped height     Home Equipment: BSC/3in1;Cane - single point;Standard Environmental consultant;Toilet riser;Adaptive equipment;Wheelchair - manual (ordering a slidding tub bench) Adaptive Equipment: Reacher;Long-handled sponge Additional Comments: Pt works from home at [employer]      Prior Functioning/Environment Prior Level of Function : Needs assist       Physical  Assist : ADLs (physical)   ADLs (physical): Bathing;Dressing;IADLs Mobility Comments: 50 ft with FW in home level ADLs Comments: Pt reports assist with starting undergarments, wears dresses rtypically and was mostly taking sponge baths but would like to take standard shower. Sleeps in a lift chair        OT Problem List: Decreased strength;Decreased activity tolerance;Impaired balance (sitting and/or standing);Decreased safety awareness;Decreased knowledge of use of DME or AE;Pain      OT Treatment/Interventions: Self-care/ADL training;Therapeutic exercise;DME and/or AE instruction;Therapeutic activities;Balance training;Patient/family education    OT Goals(Current goals can be found in the care plan section) Acute Rehab OT Goals Patient Stated Goal: to be able to start moving OT Goal Formulation: With patient Time For Goal Achievement: 01/21/21 Potential to Achieve Goals: Good ADL Goals Pt Will Perform Upper Body Bathing: Independently;sitting Pt Will Perform Lower Body Bathing: with modified independence;sit to/from stand Pt Will Transfer to Toilet: with supervision;ambulating;bedside commode Pt Will Perform Tub/Shower Transfer: with supervision;ambulating;rolling walker;grab bars;shower seat  OT Frequency: Min 2X/week   Barriers to D/C:            Co-evaluation              AM-PAC OT "6 Clicks" Daily Activity     Outcome Measure Help from another person eating  meals?: None Help from another person taking care of personal grooming?: A Little Help from another person toileting, which includes using toliet, bedpan, or urinal?: A Lot Help from another person bathing (including washing, rinsing, drying)?: A Lot Help from another person to put on and taking off regular upper body clothing?: A Little Help from another person to put on and taking off regular lower body clothing?: A Lot 6 Click Score: 16   End of Session Equipment Utilized During Treatment: Gait belt;Rolling  walker (2 wheels) Nurse Communication:  (draining and o2 readings)  Activity Tolerance: Patient limited by fatigue;Other (comment) (draining from RLE) Patient left: in bed;with call bell/phone within reach;with bed alarm set  OT Visit Diagnosis: Unsteadiness on feet (R26.81);Other abnormalities of gait and mobility (R26.89);Pain Pain - Right/Left: Right Pain - part of body: Knee                Time: 4734-0370 OT Time Calculation (min): 49 min Charges:  OT General Charges $OT Visit: 1 Visit OT Evaluation $OT Eval Low Complexity: 1 Low OT Treatments $Self Care/Home Management : 23-37 mins  Joeseph Amor OTR/L  Acute Rehab Services  [phone] office number [phone] pager number   Joeseph Amor 01/05/2021, 8:57 AM

## 2021-01-06 ENCOUNTER — Encounter (HOSPITAL_COMMUNITY)
Admission: EM | Disposition: A | Discharge status: Home Health | Payer: Self-pay | Source: Home / Self Care | Attending: Internal Medicine

## 2021-01-06 ENCOUNTER — Encounter (HOSPITAL_COMMUNITY): Payer: Self-pay | Admitting: Family Medicine

## 2021-01-06 ENCOUNTER — Inpatient Hospital Stay (HOSPITAL_COMMUNITY): Payer: No Typology Code available for payment source | Admitting: Anesthesiology

## 2021-01-06 DIAGNOSIS — L03115 Cellulitis of right lower limb: Secondary | ICD-10-CM | POA: Diagnosis not present

## 2021-01-06 DIAGNOSIS — R0609 Other forms of dyspnea: Secondary | ICD-10-CM | POA: Diagnosis not present

## 2021-01-06 DIAGNOSIS — T8453XA Infection and inflammatory reaction due to internal right knee prosthesis, initial encounter: Principal | ICD-10-CM

## 2021-01-06 HISTORY — PX: I & D KNEE WITH POLY EXCHANGE: SHX5024

## 2021-01-06 LAB — CBC
HCT: 32.8 % — ABNORMAL LOW (ref 36.0–46.0)
Hemoglobin: 9.8 g/dL — ABNORMAL LOW (ref 12.0–15.0)
MCH: 23.8 pg — ABNORMAL LOW (ref 26.0–34.0)
MCHC: 29.9 g/dL — ABNORMAL LOW (ref 30.0–36.0)
MCV: 79.6 fL — ABNORMAL LOW (ref 80.0–100.0)
Platelets: 349 10*3/uL (ref 150–400)
RBC: 4.12 MIL/uL (ref 3.87–5.11)
RDW: 18.7 % — ABNORMAL HIGH (ref 11.5–15.5)
WBC: 6.7 10*3/uL (ref 4.0–10.5)
nRBC: 0.3 % — ABNORMAL HIGH (ref 0.0–0.2)

## 2021-01-06 LAB — BASIC METABOLIC PANEL
Anion gap: 7 (ref 5–15)
BUN: 11 mg/dL (ref 8–23)
CO2: 23 mmol/L (ref 22–32)
Calcium: 8.2 mg/dL — ABNORMAL LOW (ref 8.9–10.3)
Chloride: 107 mmol/L (ref 98–111)
Creatinine, Ser: 0.69 mg/dL (ref 0.44–1.00)
GFR, Estimated: >60 mL/min (ref >60)
Glucose, Bld: 72 mg/dL (ref 70–99)
Potassium: 4.3 mmol/L (ref 3.5–5.1)
Sodium: 137 mmol/L (ref 135–145)

## 2021-01-06 SURGERY — IRRIGATION AND DEBRIDEMENT KNEE WITH POLY EXCHANGE
Anesthesia: General | Site: Knee | Laterality: Right

## 2021-01-06 MED ORDER — CELECOXIB 200 MG PO CAPS
200.0000 mg | ORAL_CAPSULE | Freq: Once | ORAL | Status: AC
  Administered 2021-01-06: 200 mg via ORAL
  Filled 2021-01-06: qty 1

## 2021-01-06 MED ORDER — FENTANYL CITRATE (PF) 250 MCG/5ML IJ SOLN
INTRAMUSCULAR | Status: AC
  Filled 2021-01-06: qty 5

## 2021-01-06 MED ORDER — FENTANYL CITRATE (PF) 250 MCG/5ML IJ SOLN
INTRAMUSCULAR | Status: DC | PRN
  Administered 2021-01-06 (×4): 50 ug via INTRAVENOUS

## 2021-01-06 MED ORDER — VANCOMYCIN HCL 1000 MG IV SOLR
INTRAVENOUS | Status: AC
  Filled 2021-01-06: qty 20

## 2021-01-06 MED ORDER — OXYCODONE HCL 5 MG PO TABS: 5.0000 mg | ORAL_TABLET | Freq: Once | ORAL | Status: DC | PRN

## 2021-01-06 MED ORDER — LACTATED RINGERS IV SOLN: INTRAVENOUS | Status: DC

## 2021-01-06 MED ORDER — 0.9 % SODIUM CHLORIDE (POUR BTL) OPTIME
TOPICAL | Status: DC | PRN
  Administered 2021-01-06: 1000 mL

## 2021-01-06 MED ORDER — MIDAZOLAM HCL 2 MG/2ML IJ SOLN
INTRAMUSCULAR | Status: AC
  Filled 2021-01-06: qty 2

## 2021-01-06 MED ORDER — ACETAMINOPHEN 500 MG PO TABS
1000.0000 mg | ORAL_TABLET | Freq: Once | ORAL | Status: AC
  Administered 2021-01-06: 1000 mg via ORAL
  Filled 2021-01-06: qty 2

## 2021-01-06 MED ORDER — HYDROMORPHONE HCL 1 MG/ML IJ SOLN
INTRAMUSCULAR | Status: AC
  Filled 2021-01-06: qty 1

## 2021-01-06 MED ORDER — HYDROMORPHONE HCL 1 MG/ML IJ SOLN
0.2500 mg | INTRAMUSCULAR | Status: DC | PRN
  Administered 2021-01-06 (×4): 0.5 mg via INTRAVENOUS

## 2021-01-06 MED ORDER — ROCURONIUM BROMIDE 10 MG/ML (PF) SYRINGE
PREFILLED_SYRINGE | INTRAVENOUS | Status: DC | PRN
  Administered 2021-01-06: 70 mg via INTRAVENOUS

## 2021-01-06 MED ORDER — LIDOCAINE 2% (20 MG/ML) 5 ML SYRINGE
INTRAMUSCULAR | Status: DC | PRN
  Administered 2021-01-06: 60 mg via INTRAVENOUS

## 2021-01-06 MED ORDER — FENTANYL CITRATE (PF) 100 MCG/2ML IJ SOLN
INTRAMUSCULAR | Status: AC
  Filled 2021-01-06: qty 2

## 2021-01-06 MED ORDER — OXYCODONE HCL 5 MG/5ML PO SOLN: 5.0000 mg | Freq: Once | ORAL | Status: DC | PRN

## 2021-01-06 MED ORDER — DEXAMETHASONE SODIUM PHOSPHATE 4 MG/ML IJ SOLN
INTRAMUSCULAR | Status: DC | PRN
  Administered 2021-01-06: 8 mg via INTRAVENOUS

## 2021-01-06 MED ORDER — SUGAMMADEX SODIUM 200 MG/2ML IV SOLN
INTRAVENOUS | Status: DC | PRN
  Administered 2021-01-06: 570.4 mg via INTRAVENOUS

## 2021-01-06 MED ORDER — CHLORHEXIDINE GLUCONATE CLOTH 2 % EX PADS
6.0000 | MEDICATED_PAD | Freq: Every day | CUTANEOUS | Status: DC
  Administered 2021-01-07 – 2021-01-08 (×2): 6 via TOPICAL

## 2021-01-06 MED ORDER — PROPOFOL 10 MG/ML IV BOLUS
INTRAVENOUS | Status: DC | PRN
  Administered 2021-01-06: 150 mg via INTRAVENOUS

## 2021-01-06 MED ORDER — ORAL CARE MOUTH RINSE: 15.0000 mL | Freq: Once | OROMUCOSAL | Status: AC

## 2021-01-06 MED ORDER — PROMETHAZINE HCL 25 MG/ML IJ SOLN
6.2500 mg | INTRAMUSCULAR | Status: DC | PRN
Start: 2021-01-06 — End: 2021-01-06

## 2021-01-06 MED ORDER — SODIUM CHLORIDE 0.9 % IR SOLN
Status: DC | PRN
  Administered 2021-01-06 (×2): 3000 mL

## 2021-01-06 MED ORDER — RIVAROXABAN 10 MG PO TABS
20.0000 mg | ORAL_TABLET | Freq: Every day | ORAL | Status: DC
Start: 2021-01-07 — End: 2021-01-13
  Administered 2021-01-07 – 2021-01-12 (×6): 20 mg via ORAL
  Filled 2021-01-06 (×6): qty 2

## 2021-01-06 MED ORDER — FENTANYL CITRATE (PF) 100 MCG/2ML IJ SOLN
25.0000 ug | INTRAMUSCULAR | Status: DC | PRN
  Administered 2021-01-06 (×3): 50 ug via INTRAVENOUS

## 2021-01-06 MED ORDER — VANCOMYCIN HCL 1000 MG IV SOLR
INTRAVENOUS | Status: DC | PRN
  Administered 2021-01-06: 1000 mg via TOPICAL

## 2021-01-06 MED ORDER — ONDANSETRON HCL 4 MG/2ML IJ SOLN
INTRAMUSCULAR | Status: DC | PRN
  Administered 2021-01-06: 4 mg via INTRAVENOUS

## 2021-01-06 MED ORDER — SODIUM CHLORIDE 0.9 % IV SOLN
2.0000 g | Freq: Once | INTRAVENOUS | Status: DC
  Filled 2021-01-06: qty 2

## 2021-01-06 MED ORDER — CHLORHEXIDINE GLUCONATE 0.12 % MT SOLN
15.0000 mL | Freq: Once | OROMUCOSAL | Status: AC
  Administered 2021-01-06: 15 mL via OROMUCOSAL
  Filled 2021-01-06: qty 15

## 2021-01-06 SURGICAL SUPPLY — 67 items
BAG COUNTER SPONGE SURGICOUNT (BAG) ×2 IMPLANT
BANDAGE ESMARK 6X9 LF (GAUZE/BANDAGES/DRESSINGS) ×1 IMPLANT
BNDG ELASTIC 6X5.8 VLCR STR LF (GAUZE/BANDAGES/DRESSINGS) ×2 IMPLANT
BNDG ESMARK 6X9 LF (GAUZE/BANDAGES/DRESSINGS) ×2
BOWL SMART MIX CTS (DISPOSABLE) ×2 IMPLANT
CANISTER WOUNDNEG PRESSURE 500 (CANNISTER) ×2 IMPLANT
COVER SURGICAL LIGHT HANDLE (MISCELLANEOUS) ×2 IMPLANT
CUFF TOURN SGL QUICK 34 (TOURNIQUET CUFF)
CUFF TOURN SGL QUICK 42 (TOURNIQUET CUFF) IMPLANT
CUFF TRNQT CYL 34X4.125X (TOURNIQUET CUFF) IMPLANT
DRAPE EXTREMITY T 121X128X90 (DISPOSABLE) ×2 IMPLANT
DRAPE HALF SHEET 40X57 (DRAPES) ×2 IMPLANT
DRAPE IMP U-DRAPE 54X76 (DRAPES) ×2 IMPLANT
DRAPE POUCH INSTRU U-SHP 10X18 (DRAPES) ×2 IMPLANT
DRAPE U-SHAPE 47X51 STRL (DRAPES) ×2 IMPLANT
DRESSING PEEL AND PLC PRVNA 13 (GAUZE/BANDAGES/DRESSINGS) ×1 IMPLANT
DRSG ADAPTIC 3X8 NADH LF (GAUZE/BANDAGES/DRESSINGS) ×2 IMPLANT
DRSG PAD ABDOMINAL 8X10 ST (GAUZE/BANDAGES/DRESSINGS) ×4 IMPLANT
DRSG PEEL AND PLACE PREVENA 13 (GAUZE/BANDAGES/DRESSINGS) ×2
ELECT REM PT RETURN 9FT ADLT (ELECTROSURGICAL) ×2
ELECTRODE REM PT RTRN 9FT ADLT (ELECTROSURGICAL) ×1 IMPLANT
FACESHIELD WRAPAROUND (MASK) ×2 IMPLANT
GAUZE SPONGE 4X4 12PLY STRL (GAUZE/BANDAGES/DRESSINGS) ×2 IMPLANT
GLOVE SRG 8 PF TXTR STRL LF DI (GLOVE) ×1 IMPLANT
GLOVE SURG ENC MOIS LTX SZ8 (GLOVE) ×2 IMPLANT
GLOVE SURG ORTHO LTX SZ7.5 (GLOVE) ×2 IMPLANT
GLOVE SURG UNDER POLY LF SZ8 (GLOVE) ×2
GOWN STRL REUS W/ TWL LRG LVL3 (GOWN DISPOSABLE) ×2 IMPLANT
GOWN STRL REUS W/ TWL XL LVL3 (GOWN DISPOSABLE) ×4 IMPLANT
GOWN STRL REUS W/TWL LRG LVL3 (GOWN DISPOSABLE) ×4
GOWN STRL REUS W/TWL XL LVL3 (GOWN DISPOSABLE) ×8
HANDPIECE INTERPULSE COAX TIP (DISPOSABLE) ×2
IMMOBILIZER KNEE 20 (SOFTGOODS)
IMMOBILIZER KNEE 20 THIGH 36 (SOFTGOODS) IMPLANT
IMMOBILIZER KNEE 22 UNIV (SOFTGOODS) ×2 IMPLANT
IMMOBILIZER KNEE 24 THIGH 36 (MISCELLANEOUS) IMPLANT
IMMOBILIZER KNEE 24 UNIV (MISCELLANEOUS)
INSERT TIB 4 13XTL STAB + STRL (Insert) ×1 IMPLANT
INSERT TIB TRIATH 4X13 (Insert) ×2 IMPLANT
INSRT TIB 4 13XTL STAB + STRL (Insert) ×1 IMPLANT
KIT BASIN OR (CUSTOM PROCEDURE TRAY) ×2 IMPLANT
KIT PREVENA INCISION MGT 13 (CANNISTER) IMPLANT
KIT PREVENA INCISION MGT20CM45 (CANNISTER) ×2 IMPLANT
KIT TURNOVER KIT B (KITS) ×2 IMPLANT
MANIFOLD NEPTUNE II (INSTRUMENTS) ×2 IMPLANT
NS IRRIG 1000ML POUR BTL (IV SOLUTION) ×2 IMPLANT
PACK TOTAL JOINT (CUSTOM PROCEDURE TRAY) ×2 IMPLANT
PAD ARMBOARD 7.5X6 YLW CONV (MISCELLANEOUS) ×4 IMPLANT
PADDING CAST COTTON 6X4 STRL (CAST SUPPLIES) ×4 IMPLANT
SET HNDPC FAN SPRY TIP SCT (DISPOSABLE) ×1 IMPLANT
SPONGE T-LAP 18X18 ~~LOC~~+RFID (SPONGE) IMPLANT
STRIP CLOSURE SKIN 1/2X4 (GAUZE/BANDAGES/DRESSINGS) ×4 IMPLANT
SUCTION FRAZIER HANDLE 10FR (MISCELLANEOUS) ×2
SUCTION TUBE FRAZIER 10FR DISP (MISCELLANEOUS) ×1 IMPLANT
SUT ETHILON 2 0 PSLX (SUTURE) ×6 IMPLANT
SUT MNCRL AB 4-0 PS2 18 (SUTURE) ×2 IMPLANT
SUT VIC AB 0 CT1 27 (SUTURE) ×4
SUT VIC AB 0 CT1 27XBRD ANBCTR (SUTURE) ×2 IMPLANT
SUT VIC AB 1 CT1 27 (SUTURE) ×6
SUT VIC AB 1 CT1 27XBRD ANBCTR (SUTURE) ×3 IMPLANT
SUT VIC AB 2-0 CT1 27 (SUTURE) ×12
SUT VIC AB 2-0 CT1 TAPERPNT 27 (SUTURE) ×6 IMPLANT
SYR 50ML SLIP (SYRINGE) ×2 IMPLANT
TOWEL GREEN STERILE (TOWEL DISPOSABLE) ×2 IMPLANT
TOWEL GREEN STERILE FF (TOWEL DISPOSABLE) ×2 IMPLANT
TRAY FOLEY MTR SLVR 16FR STAT (SET/KITS/TRAYS/PACK) ×2 IMPLANT
WATER STERILE IRR 1000ML POUR (IV SOLUTION) ×2 IMPLANT

## 2021-01-06 NOTE — TOC Progression Note (Signed)
Transition of Care [facility]) - Progression Note    Patient Details  Name: [name] MRN: [id number] Date of Birth: 15-Jun-1960  Transition of Care [facility]) CM/SW [place], [place] Phone Number: 01/06/2021, 1:33 PM  Clinical Narrative:     CSW used hub to fax referrals to [facility] in [place] and [place] areas.  CSW also paper faxed referral to [facility] in [place].   Expected Discharge Plan: [place] Barriers to Discharge: Continued Medical Work up  Expected Discharge Plan and Services Expected Discharge Plan: [place] In-house Referral: Clinical Social Work     Living arrangements for the past 2 months: Single Family Home                                       Social Determinants of Health (SDOH) Interventions    Readmission Risk Interventions No flowsheet data found.

## 2021-01-06 NOTE — Progress Notes (Signed)
PROGRESS NOTE    [name]  TMA:[id number] DOB: Oct 03, 1960 DOA: 01/03/2021 PCP: Mateo Flow, MD   Brief Narrative: 60 year old with past medical history significant for morbid obesity, PAF, OSA, history of PE with recent total knee replacement on the right.  She underwent right total knee arthroplasty in December 23, 2561 complicated by A. fib RVR requiring prolonged hospitalization.  She has had significant pain and difficulty with mobility but did not want to go to rehab and was discharged home.  She presented due to worsening shortness of breath on exertion.  She also had episode of orthostatic hypotension at home.  He has been taking Xarelto. She has been evaluated by orthopedic, who is recommending IV antibiotics for mild knee cellulitis. Underwent a CT angio chest which was negative for PE, Doppler lower extremity negative for DVT.  Assessment & Plan:   Principal Problem:   Dyspnea Active Problems:   Paroxysmal atrial fibrillation (HCC)   Status post total right knee replacement   Tachypnea   Cellulitis of right knee   Chronic anticoagulation   1-Dyspnea on exertion, Tachypnea: CTA negative for PE.  She was premedicated due to allergy to contrast. Continue with nebulizer Doppler lower extremity negative for DVT Echo Normal EF, LVF.  No Significant drop in hemoglobin. EKG: Sinus rhythm. Troponin negative. Might benefit from rehab Schedule albuterol.  Suspect deconditioning. Oxygen sat normal on ambulation with OT>   2-Paroxysmal A. fib: Continue with metoprolol.  EKG sinus rhythm. Holding xarelto due to Right  knee hematoma.   3-Chronic anticoagulation; Holding xarelto due to knee hematoma, bleeding. Plan for Sx today.   4-s/p total Right  knee replacement, mild superimposed cellulitis: -Now on IV antibiotics.  -Plan for irrigation and debridement of right knee 12/02 due to bleeding and concern for infection.   5-Anemia; monitor. Started ferrous sulfate.  Hb  stable.   Estimated body mass index is 50.75 kg/m as calculated from the following:   Height as of 12/23/20: 5\' 6"  (1.676 m).   Weight as of 12/23/20: 142.6 kg.   DVT prophylaxis: SCD Code Status: Full code Family Communication: care discussed with patient.  Disposition Plan:  Status is: Inpatient  Remains inpatient appropriate because: Patient admitted with dyspnea on exertion, recent total knee replacement, might need placement.        Consultants:  Dr. Ninfa Linden Procedures:  ECHO  Antimicrobials:    Subjective: No new complaints.  Awaiting to go to OR for I and D   Objective: Vitals:   01/05/21 1007 01/05/21 1737 01/05/21 2129 01/06/21 0530  BP: (!) 144/78 (!) 122/48 133/60 (!) 115/58  Pulse: 65 74 75 71  Resp: 18 17 18 16   Temp: 98 F (36.7 C) 97.7 F (36.5 C) 98.4 F (36.9 C) 98.1 F (36.7 C)  TempSrc:  Oral Oral Oral  SpO2: 100% 98% 94% 94%    Intake/Output Summary (Last 24 hours) at 01/06/2021 8937 Last data filed at 01/06/2021 0600 Gross per 24 hour  Intake 870 ml  Output 2175 ml  Net -1305 ml    There were no vitals filed for this visit.  Examination:  General exam: NAD Respiratory system: CTA Cardiovascular system: S 1, S 2 RRR Gastrointestinal system: BS present, soft, nt Central nervous system: Alert, non focal.  Extremities: Right knee with clean dressing   Data Reviewed: I have personally reviewed following labs and imaging studies  CBC: Recent Labs  Lab 12/30/20 0901 01/03/21 1012 01/04/21 0339 01/05/21 0907  WBC 7.2  9.2 8.0 10.4  NEUTROABS  --  6.9  --   --   HGB 9.9* 10.3* 9.7* 9.6*  HCT 32.9* 34.9* 31.7* 31.8*  MCV 81.4 81.4 81.3 80.7  PLT 209 368 271 741    Basic Metabolic Panel: Recent Labs  Lab 01/03/21 1012 01/04/21 0339  NA 137 135  K 4.3 3.9  CL 102 104  CO2 27 24  GLUCOSE 98 113*  BUN 7* 6*  CREATININE 0.67 0.60  CALCIUM 8.5* 8.3*    GFR: Estimated Creatinine Clearance: 108 mL/min (by C-G  formula based on SCr of 0.6 mg/dL). Liver Function Tests: Recent Labs  Lab 01/03/21 1012  AST 30  ALT 20  ALKPHOS 55  BILITOT 0.7  PROT 6.2*  ALBUMIN 2.1*    No results for input(s): LIPASE, AMYLASE in the last 168 hours. No results for input(s): AMMONIA in the last 168 hours. Coagulation Profile: No results for input(s): INR, PROTIME in the last 168 hours. Cardiac Enzymes: Recent Labs  Lab 01/03/21 1638  CKTOTAL 142    BNP (last 3 results) No results for input(s): PROBNP in the last 8760 hours. HbA1C: No results for input(s): HGBA1C in the last 72 hours. CBG: No results for input(s): GLUCAP in the last 168 hours. Lipid Profile: No results for input(s): CHOL, HDL, LDLCALC, TRIG, CHOLHDL, LDLDIRECT in the last 72 hours. Thyroid Function Tests: No results for input(s): TSH, T4TOTAL, FREET4, T3FREE, THYROIDAB in the last 72 hours. Anemia Panel: No results for input(s): VITAMINB12, FOLATE, FERRITIN, TIBC, IRON, RETICCTPCT in the last 72 hours. Sepsis Labs: No results for input(s): PROCALCITON, LATICACIDVEN in the last 168 hours.  Recent Results (from the past 240 hour(s))  Resp Panel by RT-PCR (Flu A&B, Covid) Nasopharyngeal Swab     Status: None   Collection Time: 01/03/21  4:10 PM   Specimen: Nasopharyngeal Swab; Nasopharyngeal(NP) swabs in vial transport medium  Result Value Ref Range Status   SARS Coronavirus 2 by RT PCR NEGATIVE NEGATIVE Final    Comment: (NOTE) SARS-CoV-2 target nucleic acids are NOT DETECTED.  The SARS-CoV-2 RNA is generally detectable in upper respiratory specimens during the acute phase of infection. The lowest concentration of SARS-CoV-2 viral copies this assay can detect is 138 copies/mL. A negative result does not preclude SARS-Cov-2 infection and should not be used as the sole basis for treatment or other patient management decisions. A negative result may occur with  improper specimen collection/handling, submission of specimen  other than nasopharyngeal swab, presence of viral mutation(s) within the areas targeted by this assay, and inadequate number of viral copies(<138 copies/mL). A negative result must be combined with clinical observations, patient history, and epidemiological information. The expected result is Negative.  Fact Sheet for Patients:  EntrepreneurPulse.com.au  Fact Sheet for Healthcare Providers:  IncredibleEmployment.be  This test is no t yet approved or cleared by the [place] FDA and  has been authorized for detection and/or diagnosis of SARS-CoV-2 by FDA under an Emergency Use Authorization (EUA). This EUA will remain  in effect (meaning this test can be used) for the duration of the COVID-19 declaration under Section 564(b)(1) of the Act, 21 U.S.C.section 360bbb-3(b)(1), unless the authorization is terminated  or revoked sooner.       Influenza A by PCR NEGATIVE NEGATIVE Final   Influenza B by PCR NEGATIVE NEGATIVE Final    Comment: (NOTE) The Xpert Xpress SARS-CoV-2/FLU/RSV plus assay is intended as an aid in the diagnosis of influenza from Nasopharyngeal swab specimens and should  not be used as a sole basis for treatment. Nasal washings and aspirates are unacceptable for Xpert Xpress SARS-CoV-2/FLU/RSV testing.  Fact Sheet for Patients: EntrepreneurPulse.com.au  Fact Sheet for Healthcare Providers: IncredibleEmployment.be  This test is not yet approved or cleared by the [place] FDA and has been authorized for detection and/or diagnosis of SARS-CoV-2 by FDA under an Emergency Use Authorization (EUA). This EUA will remain in effect (meaning this test can be used) for the duration of the COVID-19 declaration under Section 564(b)(1) of the Act, 21 U.S.C. section 360bbb-3(b)(1), unless the authorization is terminated or revoked.  Performed at [facility], [place] [address], [place],  [place] [address]   Urine Culture     Status: Abnormal   Collection Time: 01/03/21  6:19 PM   Specimen: Urine, Clean Catch  Result Value Ref Range Status   Specimen Description URINE, CLEAN CATCH  Final   Special Requests   Final    NONE Performed at [facility], [place] [address], [place], [place] [address]    Culture MULTIPLE SPECIES PRESENT, SUGGEST RECOLLECTION (A)  Final   Report Status 01/04/2021 FINAL  Final          Radiology Studies: CT Angio Chest PE W and/or Wo Contrast  Result Date: 01/04/2021 CLINICAL DATA:  Concern for pulmonary embolus EXAM: CT ANGIOGRAPHY CHEST WITH CONTRAST TECHNIQUE: Multidetector CT imaging of the chest was performed using the standard protocol during bolus administration of intravenous contrast. Multiplanar CT image reconstructions and MIPs were obtained to evaluate the vascular anatomy. CONTRAST:  184mL OMNIPAQUE IOHEXOL 350 MG/ML SOLN COMPARISON:  CT chest angio dated August 31, 2016 FINDINGS: Cardiovascular: Somewhat suboptimal contrast opacification of the pulmonary arteries. Within limitations, there is no evidence of pulmonary embolus to the level of the segmental arteries. Normal heart size. No pericardial effusion. No significant coronary artery calcifications or significant atherosclerotic disease of thoracic aorta. Mediastinum/Nodes: Small hiatal hernia. Thyroid is unremarkable. No pathologically enlarged lymph nodes seen in the chest. Lungs/Pleura: Central airways are patent. Mild bilateral air trapping. Mild diffuse ground-glass opacities, compatible with atelectasis related to expiratory phase of imaging. No focal consolidation. No pneumothorax. Upper Abdomen: Cholecystectomy clips. Gastric band noted. No acute abnormality. Musculoskeletal: No chest wall abnormality. No acute or significant osseous findings. Review of the MIP images confirms the above findings. IMPRESSION: 1. No evidence of pulmonary embolus to the level of the segmental arteries.  2. Mild bilateral air trapping, findings can be seen in the setting of small airways disease. 3. No acute intrathoracic process. Electronically Signed   By: Yetta Glassman M.D.   On: 01/04/2021 12:34   ECHOCARDIOGRAM COMPLETE  Result Date: 01/04/2021    ECHOCARDIOGRAM REPORT   Patient Name:   TALAYEH BRUINSMA Date of Exam: 01/04/2021 Medical Rec #:  [id number]         Height:       66.0 in Accession #:    [id number]        Weight:       314.4 lb Date of Birth:  Oct 21, 1960          BSA:          2.424 m Patient Age:    63 years          BP:           133/61 mmHg Patient Gender: F                 HR:  65 bpm. Exam Location:  Inpatient Procedure: 2D Echo, Cardiac Doppler and Color Doppler Indications:    Dyspnea  History:        Patient has prior history of Echocardiogram examinations, most                 recent 07/21/2018. CHF, Arrythmias:Atrial Fibrillation,                 Signs/Symptoms:Shortness of Breath; Risk Factors:Dyslipidemia.  Sonographer:    Bernadene Person RDCS Referring Phys: [id number] [place]  1. Left ventricular ejection fraction, by estimation, is 60 to 65%. The left ventricle has normal function. The left ventricle has no regional wall motion abnormalities. Left ventricular diastolic parameters were normal.  2. Right ventricular systolic function is normal. The right ventricular size is normal. There is normal pulmonary artery systolic pressure.  3. Left atrial size was mildly dilated.  4. The mitral valve is normal in structure. No evidence of mitral valve regurgitation. No evidence of mitral stenosis.  5. The aortic valve is tricuspid. Aortic valve regurgitation is not visualized. Aortic valve sclerosis is present, with no evidence of aortic valve stenosis.  6. The inferior vena cava is normal in size with greater than 50% respiratory variability, suggesting right atrial pressure of 3 mmHg. Comparison(s): A prior study was performed on 07/21/2018. FINDINGS  Left  Ventricle: Left ventricular ejection fraction, by estimation, is 60 to 65%. The left ventricle has normal function. The left ventricle has no regional wall motion abnormalities. The left ventricular internal cavity size was normal in size. There is  no left ventricular hypertrophy. Left ventricular diastolic parameters were normal. Right Ventricle: The right ventricular size is normal. No increase in right ventricular wall thickness. Right ventricular systolic function is normal. There is normal pulmonary artery systolic pressure. The tricuspid regurgitant velocity is 2.08 m/s, and  with an assumed right atrial pressure of 15 mmHg, the estimated right ventricular systolic pressure is 77.8 mmHg. Left Atrium: Left atrial size was mildly dilated. Right Atrium: Right atrial size was normal in size. Pericardium: There is no evidence of pericardial effusion. Mitral Valve: The mitral valve is normal in structure. Mild mitral annular calcification. No evidence of mitral valve regurgitation. No evidence of mitral valve stenosis. Tricuspid Valve: The tricuspid valve is normal in structure. Tricuspid valve regurgitation is not demonstrated. No evidence of tricuspid stenosis. Aortic Valve: The aortic valve is tricuspid. Aortic valve regurgitation is not visualized. Aortic valve sclerosis is present, with no evidence of aortic valve stenosis. Pulmonic Valve: The pulmonic valve was not well visualized. Pulmonic valve regurgitation is not visualized. No evidence of pulmonic stenosis. Aorta: The aortic root is normal in size and structure. Venous: The inferior vena cava is normal in size with greater than 50% respiratory variability, suggesting right atrial pressure of 3 mmHg. IAS/Shunts: No atrial level shunt detected by color flow Doppler.  LEFT VENTRICLE PLAX 2D LVIDd:         5.00 cm      Diastology LVIDs:         3.50 cm      LV e' medial:    8.92 cm/s LV PW:         1.10 cm      LV E/e' medial:  8.3 LV IVS:        0.80 cm       LV e' lateral:   10.80 cm/s LVOT diam:     2.30 cm  LV E/e' lateral: 6.9 LV SV:         108 LV SV Index:   45 LVOT Area:     4.15 cm  LV Volumes (MOD) LV vol d, MOD A2C: 150.0 ml LV vol d, MOD A4C: 168.0 ml LV vol s, MOD A2C: 73.0 ml LV vol s, MOD A4C: 83.0 ml LV SV MOD A2C:     77.0 ml LV SV MOD A4C:     168.0 ml LV SV MOD BP:      81.1 ml RIGHT VENTRICLE RV S prime:     14.90 cm/s TAPSE (M-mode): 2.2 cm LEFT ATRIUM             Index        RIGHT ATRIUM           Index LA diam:        4.40 cm 1.82 cm/m   RA Area:     19.90 cm LA Vol (A2C):   74.4 ml 30.70 ml/m  RA Volume:   54.30 ml  22.40 ml/m LA Vol (A4C):   84.0 ml 34.66 ml/m LA Biplane Vol: 85.6 ml 35.32 ml/m  AORTIC VALVE LVOT Vmax:   113.00 cm/s LVOT Vmean:  72.700 cm/s LVOT VTI:    0.260 m  AORTA Ao Root diam: 3.60 cm Ao Asc diam:  3.50 cm MITRAL VALVE               TRICUSPID VALVE MV Area (PHT): 1.74 cm    TR Peak grad:   17.3 mmHg MV Decel Time: 436 msec    TR Vmax:        208.00 cm/s MV E velocity: 74.10 cm/s MV A velocity: 45.80 cm/s  SHUNTS MV E/A ratio:  1.62        Systemic VTI:  0.26 m                            Systemic Diam: 2.30 cm Kardie Tobb DO Electronically signed by Berniece Salines DO Signature Date/Time: 01/04/2021/6:37:57 PM    Final         Scheduled Meds:  ferrous sulfate  325 mg Oral Q breakfast   fesoterodine  4 mg Oral Daily   folic acid  1 mg Oral Daily   metoprolol succinate  50 mg Oral Daily   multivitamin with minerals  1 tablet Oral Daily   pantoprazole  40 mg Oral Daily   saccharomyces boulardii  250 mg Oral BID   Continuous Infusions:  ceFEPime (MAXIPIME) IV 2 g (01/06/21 0647)   vancomycin        LOS: 3 days    Time spent: 35 minutes    Emanii Bugbee A Symone Cornman, MD [facility]   If 7PM-7AM, please contact night-coverage [email or web address]  01/06/2021, 8:58 AM

## 2021-01-06 NOTE — Brief Op Note (Signed)
01/06/2021  5:49 PM  PATIENT:  [name]  60 y.o. female  PRE-OPERATIVE DIAGNOSIS:  Right knee drainage, possible infection S/P TKA  POST-OPERATIVE DIAGNOSIS:  Right knee drainage, possible infection S/P TKA  PROCEDURE:  Procedure(s): IRRIGATION AND DEBRIDEMENT RIGHT KNEE WITH POSSIBLE POLY EXCHANGE (Right)  SURGEON:  Surgeon(s) and Role:    * [name], MD - Primary  PHYSICIAN ASSISTANT:  Benita Stabile, PA-C   ANESTHESIA:   general  EBL:  200 mL   COUNTS:  YES  DICTATION: .Other Dictation: Dictation Number [id number]  PLAN OF CARE: Admit for overnight observation  PATIENT DISPOSITION:  PACU - hemodynamically stable.   Delay start of Pharmacological VTE agent (>24hrs) due to surgical blood loss or risk of bleeding: no

## 2021-01-06 NOTE — Anesthesia Preprocedure Evaluation (Addendum)
Anesthesia Evaluation  Patient identified by MRN, date of birth, ID band Patient awake    Reviewed: Allergy & Precautions, NPO status , Patient's Chart, lab work & pertinent test results  History of Anesthesia Complications (+) history of anesthetic complications (episode of awareness during intubation for lap chole)  Airway Mallampati: II  TM Distance: >3 FB Neck ROM: Full    Dental  (+) Dental Advisory Given, Teeth Intact   Pulmonary PE   Pulmonary exam normal        Cardiovascular + Peripheral Vascular Disease  Normal cardiovascular exam+ dysrhythmias Atrial Fibrillation    '22 TTE - EF 60 to 65%. Left atrial size was mildly dilated. No significant valvulopathy    Neuro/Psych negative neurological ROS  negative psych ROS   GI/Hepatic Neg liver ROS, GERD  Medicated and Controlled, S/p gastric banding    Endo/Other  diabetesMorbid obesity  Renal/GU negative Renal ROS     Musculoskeletal  (+) Arthritis ,   Abdominal (+) + obese,   Peds  Hematology  (+) anemia ,  On xarelto    Anesthesia Other Findings   Reproductive/Obstetrics                            Anesthesia Physical Anesthesia Plan  ASA: 3  Anesthesia Plan: General   Post-op Pain Management: Tylenol PO (pre-op) and Celebrex PO (pre-op)   Induction: Intravenous  PONV Risk Score and Plan: 4 or greater and Treatment may vary due to age or medical condition, Ondansetron, Midazolam and Dexamethasone  Airway Management Planned: Oral ETT  Additional Equipment: None  Intra-op Plan:   Post-operative Plan: Extubation in OR  Informed Consent: I have reviewed the patients History and Physical, chart, labs and discussed the procedure including the risks, benefits and alternatives for the proposed anesthesia with the patient or authorized representative who has indicated his/her understanding and acceptance.     Dental  advisory given  Plan Discussed with: CRNA and Anesthesiologist  Anesthesia Plan Comments:       Anesthesia Quick Evaluation

## 2021-01-06 NOTE — Progress Notes (Signed)
Patient ID: [name], female   DOB: 1960/02/20, 60 y.o.   MRN: [id number] [name] understands that we are returning to surgery today to washout her right knee given the abundant drainage she is having from that total knee arthroplasty.  We will assess for any type of infection and address the knee accordingly in terms of potential polyliner exchange and placement of antibiotic beads or placing her on long-term antibiotics.  A lot will depend on what we find intraoperative.  She has been NPO.  The risks and benefits of surgery been explained in detail and informed consent is obtained.  The right knee has been marked.

## 2021-01-06 NOTE — Progress Notes (Signed)
Occupational Therapy Treatment Patient Details Name: [name] MRN: [id number] DOB: 08/18/1960 Today's Date: 01/06/2021   History of present illness 60 y.o. female presents to [facility] hospital on 01/03/2021 with reports of SOB and DOE. Pt underwent R TKA 36/14, complicated by Afib with RVR. Pt admitted for management of Afib and RLE cellulitis. ERX:VQMGQQ obesity, PAF, OA, hx of PE, anemia, CHF   OT comments  Patient continues to make progress towards goals in skilled OT session. Patient's session encompassed functional mobility to walk household distances, transfers, and toileting. Despite pain in R knee, patient demonstrates good mobility as long as she has someone to assist with transitioning R leg to EOB. Patient able to complete a few steps to recliner with legs elevated at end of session. Of note, R ankle with increased swelling and increased indentation from bariatric sock therefore sock removed and patient educated on techniques to aide with swelling. Discharge remains appropriate, therapy will continue to follow.    Recommendations for follow up therapy are one component of a multi-disciplinary discharge planning process, led by the attending physician.  Recommendations may be updated based on patient status, additional functional criteria and insurance authorization.    Follow Up Recommendations  Skilled nursing-short term rehab (<3 hours/day)    Assistance Recommended at Discharge Frequent or constant Supervision/Assistance  Equipment Recommendations  None recommended by OT    Recommendations for Other Services      Precautions / Restrictions Precautions Precautions: Fall;Knee Precaution Comments: no KI present, pt reports discontinuation of KI at home Restrictions Weight Bearing Restrictions: Yes RLE Weight Bearing: Weight bearing as tolerated Other Position/Activity Restrictions: WBAT       Mobility Bed Mobility Overal bed mobility: Needs Assistance Bed Mobility:  Supine to Sit     Supine to sit: HOB elevated;Min assist     General bed mobility comments: assistance for RLE    Transfers Overall transfer level: Needs assistance Equipment used: Rolling walker (2 wheels) Transfers: Sit to/from Stand Sit to Stand: Min guard   Step pivot transfers: Min assist       General transfer comment: completed with min a for safety from bed to recliner     Balance Overall balance assessment: Needs assistance Sitting-balance support: No upper extremity supported;Feet supported Sitting balance-Leahy Scale: Fair     Standing balance support: Bilateral upper extremity supported;Reliant on assistive device for balance Standing balance-Leahy Scale: Poor Standing balance comment: minG with BUE support of walker                           ADL either performed or assessed with clinical judgement   ADL Overall ADL's : Needs assistance/impaired     Grooming: Set up;Sitting                   Toilet Transfer: Min guard;Cueing for safety;Cueing for sequencing;Rolling walker (2 wheels) Toilet Transfer Details (indicate cue type and reason): no bariatric BSC present, patient wanting to sit on bedpan on EOB until larger BSC was ordered         Functional mobility during ADLs: Minimal assistance;Cueing for safety;Rolling walker (2 wheels);Standard walker      Extremity/Trunk Assessment              Vision       Perception     Praxis      Cognition Arousal/Alertness: Awake/alert Behavior During Therapy: WFL for tasks assessed/performed Overall Cognitive Status: Within Functional Limits for tasks  assessed                                            Exercises     Shoulder Instructions       General Comments      Pertinent Vitals/ Pain       Pain Assessment: Faces Faces Pain Scale: Hurts even more Pain Location: R knee Pain Descriptors / Indicators: Sore Pain Intervention(s): Limited activity  within patient's tolerance;Monitored during session;Repositioned  Home Living                                          Prior Functioning/Environment              Frequency  Min 2X/week        Progress Toward Goals  OT Goals(current goals can now be found in the care plan section)  Progress towards OT goals: Progressing toward goals  Acute Rehab OT Goals Patient Stated Goal: to get everything sorted with my knee OT Goal Formulation: With patient/family Time For Goal Achievement: 01/21/21 Potential to Achieve Goals: Good  Plan Discharge plan remains appropriate    Co-evaluation                 AM-PAC OT "6 Clicks" Daily Activity     Outcome Measure   Help from another person eating meals?: None Help from another person taking care of personal grooming?: A Little Help from another person toileting, which includes using toliet, bedpan, or urinal?: A Lot Help from another person bathing (including washing, rinsing, drying)?: A Lot Help from another person to put on and taking off regular upper body clothing?: A Little Help from another person to put on and taking off regular lower body clothing?: A Lot 6 Click Score: 16    End of Session Equipment Utilized During Treatment: Rolling walker (2 wheels)  OT Visit Diagnosis: Unsteadiness on feet (R26.81);Other abnormalities of gait and mobility (R26.89);Pain Pain - Right/Left: Right Pain - part of body: Knee   Activity Tolerance Patient tolerated treatment well   Patient Left with call bell/phone within reach;in chair   Nurse Communication Mobility status        Time: 6979-4801 OT Time Calculation (min): 31 min  Charges: OT General Charges $OT Visit: 1 Visit OT Treatments $Self Care/Home Management : 23-37 mins  [place]. [place], [place] [facility] [place] 01/06/2021, 12:32 PM

## 2021-01-06 NOTE — Anesthesia Procedure Notes (Signed)
Procedure Name: Intubation Date/Time: 01/06/2021 4:15 PM Performed by: Minerva Ends, CRNA Pre-anesthesia Checklist: Patient identified, Emergency Drugs available, Suction available and Patient being monitored Patient Re-evaluated:Patient Re-evaluated prior to induction Oxygen Delivery Method: Circle system utilized Preoxygenation: Pre-oxygenation with 100% oxygen Induction Type: IV induction Ventilation: Mask ventilation without difficulty Laryngoscope Size: Mac and 3 Grade View: Grade II Tube type: Oral Tube size: 7.0 mm Number of attempts: 1 Airway Equipment and Method: Stylet Placement Confirmation: ETT inserted through vocal cords under direct vision, positive ETCO2 and breath sounds checked- equal and bilateral Secured at: 22 cm Tube secured with: Tape Dental Injury: Teeth and Oropharynx as per pre-operative assessment

## 2021-01-06 NOTE — Progress Notes (Signed)
PT Cancellation Note  Patient Details Name: ROSENE PILLING MRN: [id number] DOB: May 27, 1960   Cancelled Treatment:    Reason Eval/Treat Not Completed: Patient at procedure or test/unavailable. Pt off unit for surgery. PT will follow up when pt is back to unit and medically appropriate to mobilize.   Zenaida Niece 01/06/2021, 2:31 PM

## 2021-01-06 NOTE — Plan of Care (Signed)
  Problem: Education: Goal: Knowledge of General Education information will improve Description: Including pain rating scale, medication(s)/side effects and non-pharmacologic comfort measures Outcome: Progressing   Problem: Pain Managment: Goal: General experience of comfort will improve Outcome: Progressing   

## 2021-01-06 NOTE — NC FL2 (Signed)
[place] LEVEL OF CARE SCREENING TOOL     IDENTIFICATION  Patient Name: [name] Birthdate: [birth year] Sex: female Admission Date (Current Location): 01/03/2021  [facility] and [place] Number:  Herbalist and Address:  [facility] [place], [place] [address] [place], [place] [address]      Provider Number: [id number]  Attending Physician Name and Address:  Elmarie Shiley, MD  Relative Name and Phone Number:  Malkia, Nippert (Spouse)   [phone] (Mobile)    Current Level of Care: Hospital Recommended Level of Care: [place] Prior Approval Number: [id number]  Date Approved/Denied:   PASRR Number:    Discharge Plan: SNF    Current Diagnoses: Patient Active Problem List   Diagnosis Date Noted   Tachypnea 01/03/2021   Cellulitis of right knee 01/03/2021   Chronic anticoagulation 01/03/2021   Status post total right knee replacement 12/23/2020   Unilateral primary osteoarthritis, right knee 04/07/2020   Leg edema 08/19/2018   Presence of left artificial knee joint 08/14/2016   Unilateral primary osteoarthritis, left knee 07/31/2016   Status post total knee replacement, left 07/31/2016   Abdominal hematoma    Post-operative state    Paroxysmal atrial fibrillation ([place])    Controlled type 2 diabetes mellitus with diabetic nephropathy ([place])    Wound infection 01/25/2016   Hydronephrosis, left 01/25/2016   Hydronephrosis, right 01/25/2016   DVT (deep venous thrombosis) ([place]) 01/25/2016   Atrial fibrillation ([place]) 01/25/2016   Diabetes mellitus with complication ([place])    Wound infection after surgery, initial encounter    Varicose veins of lower extremities with complications 81/85/6314   Pain and swelling of left lower leg 10/27/2013   Pain of left lower extremity 10/27/2013   Gastroenteritis, acute 09/08/2013   Fever, unspecified 09/04/2013   ASD (atrial septal defect) 08/13/2013   Pulmonary embolism  ([place]) 06/22/2013   Dyspnea 06/11/2013   Anterior slip repaired Oct 2012 01/18/2011   GI bleed 12/18/2010   Diabetes mellitus ([place]) 12/18/2010   Varicose veins of lower extremities with other complications 97/03/6376   Diabetes mellitus    HIP PAIN, RIGHT 03/06/2010    Orientation RESPIRATION BLADDER Height & Weight     Self, Time, Situation, Place  Normal External catheter Weight:   Height:     BEHAVIORAL SYMPTOMS/MOOD NEUROLOGICAL BOWEL NUTRITION STATUS      Continent Diet (see d/c summary)  AMBULATORY STATUS COMMUNICATION OF NEEDS Skin   Extensive Assist Verbally PU Stage and Appropriate Care, Surgical wounds (Incision, right knew.Pressure Injury  Coccyx Mid;Medial Deep Tissue Pressure Injury - Purple or maroon localized area of discolored intact skin or blood-filled blister due to damage of underlying soft tissue from pressure and/or shear.)                       Personal Care Assistance Level of Assistance  Bathing, Feeding, Dressing Bathing Assistance: Limited assistance Feeding assistance: Independent Dressing Assistance: Limited assistance     Functional Limitations Info  Sight, Speech, Hearing Sight Info: Adequate Hearing Info: Adequate Speech Info: Adequate    SPECIAL CARE FACTORS FREQUENCY  PT (By licensed PT), OT (By licensed OT)     PT Frequency: 5x/week OT Frequency: 5x/week            Contractures Contractures Info: Not present    Additional Factors Info  Allergies, Code Status Code Status Info: Full code Allergies Info: Contrast Media (iodinated Diagnostic Agents), Ancef, Penecillans  Current Medications (01/06/2021):  This is the current hospital active medication list Current Facility-Administered Medications  Medication Dose Route Frequency Provider Last Rate Last Admin   acetaminophen (TYLENOL) tablet 650 mg  650 mg Oral Q6H PRN Chotiner, Yevonne Aline, MD       Or   acetaminophen (TYLENOL) suppository 650 mg  650 mg Rectal Q6H  PRN Chotiner, Yevonne Aline, MD       albuterol (PROVENTIL) (2.5 MG/3ML) 0.083% nebulizer solution 2.5 mg  2.5 mg Inhalation Q6H PRN Chotiner, Yevonne Aline, MD       ceFEPIme (MAXIPIME) 2 g in sodium chloride 0.9 % 100 mL IVPB  2 g Intravenous Q8H Hammons, Kimberly B, RPH 200 mL/hr at 01/06/21 0647 2 g at 01/06/21 6195   ferrous sulfate tablet 325 mg  325 mg Oral Q breakfast Regalado, Belkys A, MD       fesoterodine (TOVIAZ) tablet 4 mg  4 mg Oral Daily Chotiner, Yevonne Aline, MD   4 mg at 09/32/67 1245   folic acid (FOLVITE) tablet 1 mg  1 mg Oral Daily Regalado, Belkys A, MD   1 mg at 01/06/21 1017   HYDROmorphone (DILAUDID) injection 1 mg  1 mg Intravenous Q3H PRN Chotiner, Yevonne Aline, MD       methocarbamol (ROBAXIN) tablet 500 mg  500 mg Oral Q6H PRN Chotiner, Yevonne Aline, MD   500 mg at 01/05/21 2318   metoprolol succinate (TOPROL-XL) 24 hr tablet 50 mg  50 mg Oral Daily Chotiner, Yevonne Aline, MD   50 mg at 01/06/21 1017   multivitamin with minerals tablet 1 tablet  1 tablet Oral Daily Chotiner, Yevonne Aline, MD   1 tablet at 01/06/21 1017   oxyCODONE (Oxy IR/ROXICODONE) immediate release tablet 5-10 mg  5-10 mg Oral Q6H PRN Chotiner, Yevonne Aline, MD   10 mg at 01/06/21 0023   pantoprazole (PROTONIX) EC tablet 40 mg  40 mg Oral Daily Chotiner, Yevonne Aline, MD   40 mg at 01/06/21 1017   saccharomyces boulardii (FLORASTOR) capsule 250 mg  250 mg Oral BID Regalado, Belkys A, MD   250 mg at 01/06/21 1017   senna-docusate (Senokot-S) tablet 1 tablet  1 tablet Oral QHS PRN Chotiner, Yevonne Aline, MD       vancomycin (VANCOREADY) IVPB 2000 mg/400 mL  2,000 mg Intravenous Q24H Hammons, Theone Murdoch, [facility]         Discharge Medications: Please see discharge summary for a list of discharge medications.  Relevant Imaging Results:  Relevant Lab Results:   Additional Information SSN [id number] Covid vaccinated x2  Bethann Berkshire, LCSW

## 2021-01-06 NOTE — TOC Progression Note (Signed)
Transition of Care [facility]) - Progression Note    Patient Details  Name: SHANAIYA BENE MRN: [id number] Date of Birth: [birth year]  Transition of Care [facility]) CM/SW [place], [place] Phone Number: 01/06/2021, 1:16 PM  Clinical Narrative:     CSW spoke with patient at bedside. Patient confirmed she is agreeable to SNF placement. Number one choice is [facility]. Patient gave CSW permission to fax out initial referral near the [place], [place], and [place] area. Patient confirmed she has received both Covid Vaccines. CSW discussed insurance authorization process with patient.Patient reports she comes from home with spouse. No furthur questions reported at this time. CSW will continue to follow and assist with patients dc planning needs.  Expected Discharge Plan: [place] Barriers to Discharge: Continued Medical Work up  Expected Discharge Plan and Services Expected Discharge Plan: [place] In-house Referral: Clinical Social Work     Living arrangements for the past 2 months: Single Family Home                                       Social Determinants of Health (SDOH) Interventions    Readmission Risk Interventions No flowsheet data found.

## 2021-01-06 NOTE — Transfer of Care (Signed)
Immediate Anesthesia Transfer of Care Note  Patient: [name]  Procedure(s) Performed: IRRIGATION AND DEBRIDEMENT RIGHT KNEE WITH POSSIBLE POLY EXCHANGE (Right: Knee)  Patient Location: PACU  Anesthesia Type:General  Level of Consciousness: awake, alert  and oriented  Airway & Oxygen Therapy: Patient Spontanous Breathing  Post-op Assessment: Report given to RN and Post -op Vital signs reviewed and stable  Post vital signs: Reviewed and stable  Last Vitals:  Vitals Value Taken Time  BP 111/67 01/06/21 1749  Temp    Pulse 70 01/06/21 1751  Resp 19 01/06/21 1751  SpO2 93 % 01/06/21 1751  Vitals shown include unvalidated device data.  Last Pain:  Vitals:   01/06/21 1427  TempSrc: Oral  PainSc:       Patients Stated Pain Goal: 0 (56/38/93 7342)  Complications: No notable events documented.

## 2021-01-07 ENCOUNTER — Inpatient Hospital Stay: Payer: Self-pay

## 2021-01-07 DIAGNOSIS — T8450XA Infection and inflammatory reaction due to unspecified internal joint prosthesis, initial encounter: Secondary | ICD-10-CM | POA: Diagnosis not present

## 2021-01-07 DIAGNOSIS — R0609 Other forms of dyspnea: Secondary | ICD-10-CM | POA: Diagnosis not present

## 2021-01-07 LAB — CBC
HCT: 30.1 % — ABNORMAL LOW (ref 36.0–46.0)
Hemoglobin: 8.9 g/dL — ABNORMAL LOW (ref 12.0–15.0)
MCH: 23.7 pg — ABNORMAL LOW (ref 26.0–34.0)
MCHC: 29.6 g/dL — ABNORMAL LOW (ref 30.0–36.0)
MCV: 80.3 fL (ref 80.0–100.0)
Platelets: 395 10*3/uL (ref 150–400)
RBC: 3.75 MIL/uL — ABNORMAL LOW (ref 3.87–5.11)
RDW: 18.5 % — ABNORMAL HIGH (ref 11.5–15.5)
WBC: 11.4 10*3/uL — ABNORMAL HIGH (ref 4.0–10.5)
nRBC: 0 % (ref 0.0–0.2)

## 2021-01-07 MED ORDER — SODIUM CHLORIDE 0.9 % IV SOLN
2.0000 g | INTRAVENOUS | Status: DC
  Administered 2021-01-07 – 2021-01-08 (×2): 2 g via INTRAVENOUS
  Filled 2021-01-07: qty 20

## 2021-01-07 NOTE — Progress Notes (Signed)
Spoke with Brynja Seller re PICC order.  States pt has PIV working well at this time.  Waiting on ID consult for ABT recommendations for single lumen vs double lumen PICC placement. Albert to notify via secure chat once seen by ID.

## 2021-01-07 NOTE — Consult Note (Signed)
[facility]       Reason for Consult: prosthetic joint infection    Referring Physician: Dr. Ninfa Linden  Principal Problem:   Dyspnea Active Problems:   Paroxysmal atrial fibrillation (HCC)   Status post total right knee replacement   Tachypnea   Cellulitis of right knee   Chronic anticoagulation    Chlorhexidine Gluconate Cloth  6 each Topical Q0600   ferrous sulfate  325 mg Oral Q breakfast   fesoterodine  4 mg Oral Daily   folic acid  1 mg Oral Daily   metoprolol succinate  50 mg Oral Daily   multivitamin with minerals  1 tablet Oral Daily   pantoprazole  40 mg Oral Daily   rivaroxaban  20 mg Oral Q supper   saccharomyces boulardii  250 mg Oral BID    Recommendations: Vancomycin and ceftriaxone pending culture growth [facility] Baseline CRP, ESR  Assessment: She has a PJI 2 weeks after implantation with gram stain positive for GPC. Will need 6 weeks of appropriate IV antibiotics followed by 3-6 months continuation oral treatment. Ok to discharge patient prior to finalizing cultures from ID standpoint,  we can adjust antibiotics after discharge if needed.   Will continue to follow  Antibiotics: Vancomycin and cefepime  HPI: [name] is a 60 y.o. female with a history of obesity, PAF, OSA who underwent a right total knee replacement about 2 weeks ago.  Her course was complicated by afib with RVR and eventually discharged home.  She was admitted with concern for hematoma vs infection and went to the OR on 12/2 for washout and now with GPC on gram stain in two cultures.  She underwent I and D with polyexchange.     Review of Systems:  Constitutional: negative for fevers and chills Gastrointestinal: negative for nausea and diarrhea Integument/breast: negative for rash All other systems reviewed and are negative    Past Medical History:  Diagnosis Date   Anemia    Arthritis    Atrial fibrillation ([place])    Bleeding  ulcer 12-2010  hospitalized for 1 week   Blood transfusion    Blood transfusion without reported diagnosis    2011-2 units transfused, bleeding ulcer   Bruises easily    CHF (congestive heart failure) ([place])    with PE resolved after    Complication of anesthesia    Constipation    Difficulty in swallowing 03/24/2013   no longer a problem   Diverticulitis    DVT (deep venous thrombosis) ([place]) 06/2013   right leg   Dysrhythmia     Hx of A fib    GERD (gastroesophageal reflux disease)    Hyperlipidemia    Leg swelling 03/24/2013   not a problem any longer   Osteoarthritis    Peripheral vascular disease (HCC)    Pre-diabetes    Pulmonary embolus ([place]) 06/2013   Reflux    Shortness of breath    with PE   Vomiting 03/24/2013   problem resolved after lap band revision    Social History   Tobacco Use   Smoking status: Never   Smokeless tobacco: Never  Vaping Use   Vaping Use: Never used  Substance Use Topics   Alcohol use: No    Alcohol/week: 0.0 standard drinks   Drug use: No    Family History  Problem Relation Age of Onset   Heart disease Father    Kidney disease Father  Peripheral vascular disease Father    Heart attack Father        73   Diabetes Mother    Arthritis Mother     Allergies  Allergen Reactions   Contrast Media [Iodinated Diagnostic Agents] Shortness Of Breath and Other (See Comments)    APNEA   Penicillins Itching and Rash    PATIENT HAD A PCN REACTION WITH IMMEDIATE RASH, FACIAL/TONGUE/THROAT SWELLING, SOB, OR LIGHTHEADEDNESS WITH HYPOTENSION:  #  #  #  YES  #  #  #   Has patient had a PCN reaction causing severe rash involving mucus membranes or skin necrosis: NO Has patient had a PCN reaction that required hospitalization NO Has patient had a PCN reaction occurring within the last 10 years: NO   Ancef [Cefazolin Sodium] Nausea And Vomiting    Tolerates IV    Physical Exam: Constitutional: in no apparent distress  Vitals:    01/07/21 0401 01/07/21 0922  BP: 113/61 (!) 109/58  Pulse: (!) 59 66  Resp: 18 18  Temp: (!) 97.5 F (36.4 C) (!) 97.4 F (36.3 C)  SpO2: 98% 95%   EYES: anicteric ENMT: no thrush Respiratory: normal respiratory effort Musculoskeletal: wrapped Skin: negatives: no rash Neuro: alert  Lab Results  Component Value Date   WBC 11.4 (H) 01/07/2021   HGB 8.9 (L) 01/07/2021   HCT 30.1 (L) 01/07/2021   MCV 80.3 01/07/2021   PLT 395 01/07/2021    Lab Results  Component Value Date   CREATININE 0.69 01/06/2021   BUN 11 01/06/2021   NA 137 01/06/2021   K 4.3 01/06/2021   CL 107 01/06/2021   CO2 23 01/06/2021    Lab Results  Component Value Date   ALT 20 01/03/2021   AST 30 01/03/2021   ALKPHOS 55 01/03/2021     Microbiology: Recent Results (from the past 240 hour(s))  Resp Panel by RT-PCR (Flu A&B, Covid) Nasopharyngeal Swab     Status: None   Collection Time: 01/03/21  4:10 PM   Specimen: Nasopharyngeal Swab; Nasopharyngeal(NP) swabs in vial transport medium  Result Value Ref Range Status   SARS Coronavirus 2 by RT PCR NEGATIVE NEGATIVE Final    Comment: (NOTE) SARS-CoV-2 target nucleic acids are NOT DETECTED.  The SARS-CoV-2 RNA is generally detectable in upper respiratory specimens during the acute phase of infection. The lowest concentration of SARS-CoV-2 viral copies this assay can detect is 138 copies/mL. A negative result does not preclude SARS-Cov-2 infection and should not be used as the sole basis for treatment or other patient management decisions. A negative result may occur with  improper specimen collection/handling, submission of specimen other than nasopharyngeal swab, presence of viral mutation(s) within the areas targeted by this assay, and inadequate number of viral copies(<138 copies/mL). A negative result must be combined with clinical observations, patient history, and epidemiological information. The expected result is Negative.  Fact Sheet for  Patients:  EntrepreneurPulse.com.au  Fact Sheet for Healthcare Providers:  IncredibleEmployment.be  This test is no t yet approved or cleared by the [place] FDA and  has been authorized for detection and/or diagnosis of SARS-CoV-2 by FDA under an Emergency Use Authorization (EUA). This EUA will remain  in effect (meaning this test can be used) for the duration of the COVID-19 declaration under Section 564(b)(1) of the Act, 21 U.S.C.section 360bbb-3(b)(1), unless the authorization is terminated  or revoked sooner.       Influenza A by PCR NEGATIVE NEGATIVE Final   Influenza  B by PCR NEGATIVE NEGATIVE Final    Comment: (NOTE) The Xpert Xpress SARS-CoV-2/FLU/RSV plus assay is intended as an aid in the diagnosis of influenza from Nasopharyngeal swab specimens and should not be used as a sole basis for treatment. Nasal washings and aspirates are unacceptable for Xpert Xpress SARS-CoV-2/FLU/RSV testing.  Fact Sheet for Patients: EntrepreneurPulse.com.au  Fact Sheet for Healthcare Providers: IncredibleEmployment.be  This test is not yet approved or cleared by the [place] FDA and has been authorized for detection and/or diagnosis of SARS-CoV-2 by FDA under an Emergency Use Authorization (EUA). This EUA will remain in effect (meaning this test can be used) for the duration of the COVID-19 declaration under Section 564(b)(1) of the Act, 21 U.S.C. section 360bbb-3(b)(1), unless the authorization is terminated or revoked.  Performed at [facility], [place] [address] [place], [place] [address]   Urine Culture     Status: Abnormal   Collection Time: 01/03/21  6:19 PM   Specimen: Urine, Clean Catch  Result Value Ref Range Status   Specimen Description URINE, CLEAN CATCH  Final   Special Requests   Final    NONE Performed at [facility], [place] [address], [place], [place] [address]     Culture MULTIPLE SPECIES PRESENT, SUGGEST RECOLLECTION (A)  Final   Report Status 01/04/2021 FINAL  Final  Aerobic/Anaerobic Culture w Gram Stain (surgical/deep wound)     Status: None (Preliminary result)   Collection Time: 01/06/21  4:35 PM   Specimen: PATH Cytology Misc. fluid; Body Fluid  Result Value Ref Range Status   Specimen Description FLUID RIGHT KNEE  Final   Special Requests SAMPLE A  Final   Gram Stain   Final    FEW WBC PRESENT, PREDOMINANTLY MONONUCLEAR RARE GRAM POSITIVE COCCI Performed at [facility], [place] [address], [place], [place] [address]    Culture PENDING  Incomplete   Report Status PENDING  Incomplete  Aerobic/Anaerobic Culture w Gram Stain (surgical/deep wound)     Status: None (Preliminary result)   Collection Time: 01/06/21  4:38 PM   Specimen: PATH Cytology Misc. fluid; Body Fluid  Result Value Ref Range Status   Specimen Description JOINT FLUID RIGHT KNEE  Final   Special Requests SAMPLE A  Final   Gram Stain   Final    MODERATE WBC PRESENT, PREDOMINANTLY MONONUCLEAR FEW GRAM POSITIVE COCCI Performed at [facility], [place] [address], [place], [place] [address]    Culture PENDING  Incomplete   Report Status PENDING  Incomplete    Thayer Headings, [facility] [email or web address] 01/07/2021, 2:32 PM

## 2021-01-07 NOTE — Anesthesia Postprocedure Evaluation (Signed)
Anesthesia Post Note  Patient: [name]  Procedure(s) Performed: IRRIGATION AND DEBRIDEMENT RIGHT KNEE WITH POSSIBLE POLY EXCHANGE (Right: Knee)     Patient location during evaluation: PACU Anesthesia Type: General Level of consciousness: awake and alert Pain management: pain level controlled Vital Signs Assessment: post-procedure vital signs reviewed and stable Respiratory status: spontaneous breathing, nonlabored ventilation, respiratory function stable and patient connected to nasal cannula oxygen Cardiovascular status: blood pressure returned to baseline and stable Postop Assessment: no apparent nausea or vomiting Anesthetic complications: no   No notable events documented.  Last Vitals:  Vitals:   01/07/21 0401 01/07/21 0922  BP: 113/61 (!) 109/58  Pulse: (!) 59 66  Resp: 18 18  Temp: (!) 36.4 C (!) 36.3 C  SpO2: 98% 95%    Last Pain:  Vitals:   01/07/21 1224  TempSrc:   PainSc: 7                  Bexley Mclester

## 2021-01-07 NOTE — Op Note (Signed)
NAMESCHWANDA, ZIMA MEDICAL RECORD NO: [id number] ACCOUNT NO: [id number] DATE OF BIRTH: January 24, 1961 FACILITY: [facility] LOCATION: [facility] PHYSICIAN: Lind Guest. Ninfa Linden, MD  Operative Report   DATE OF PROCEDURE: 01/06/2021  PREOPERATIVE DIAGNOSIS:  Right knee with hemarthrosis and draining wound with questionable infection, status post total knee arthroplasty.  POSTOPERATIVE DIAGNOSIS:  Right knee with hemarthrosis and draining wound with questionable infection, status post total knee arthroplasty.  PROCEDURE:   1.  Irrigation and debridement of right knee including sharp excisional debridement of necrotic fascia only. 2.  Poly liner exchange, right knee. 3.  Placement of incisional VAC over the right knee incision.  FINDINGS:  Dark fluid from right knee joint with significant hematoma and questionable infection with Gram stain and cultures pending.  SURGEON:  Lind Guest. Ninfa Linden, MD  ASSISTANT:  Erskine Emery, PA-C.  ANESTHESIA:  General.  ESTIMATED BLOOD LOSS:  284 mL  COMPLICATIONS:  None.  INDICATIONS:  The patient is a 60 year old female, very well known to me.  We actually replaced her left knee successfully in 2018.  She is someone who is morbidly obese with a high BMI.  She had end-stage arthritis of her right knee.  She was taken to  the operating room 2 weeks ago today where she underwent a primary right total knee arthroplasty.  Her postoperative course was complicated by AFib with RVR.  She had a history of AFib and was on chronic Xarelto and Lovenox bridging her before surgery.   During the hospitalization, she did have her RVR corrected.  She said on high dose blood thinners, which she has been on preoperative.  She was finally discharged after about a week and a half of being in the hospital and only stayed at home for about 2  days.  She was admitted 2 days ago with lethargy and shortness of breath.  There was a workup for PE and DVT, which was negative.  It was  noted that she started to drain quite a bit from her right knee incision.  Due to the copious drainage, I felt it  was reasonable to bring her to the operating room today for an irrigation and debridement, washout of the knee joint with poly liner exchange and assess for infection and other necrotic tissue.  Of note, there is no cellulitis today of her leg, but  certainly significant drainage still.  I did describe surgery to her in detail and she does wish to proceed knowing that she is having significant drainage from the right knee.  DESCRIPTION OF PROCEDURE:  After informed consent was obtained, appropriate right knee was marked.  She was brought to the operating room and placed supine on the operating table.  General anesthesia was then obtained.  A Foley catheter was placed.  Her  right thigh, knee, leg, ankle, foot were prepped with DuraPrep and sterile drapes.  A timeout was called.  She was identified correct patient, correct right knee.  We then removed all previous staples and opened up the arthrotomy.  We found a large fluid  collection in the superficial tissues of the knee.  Once we dissected down the knee joint itself we did not find any significant opening to the knee joint except for distal.  There was question whether there is a little opening of the knee joint itself.   We went ahead and performed an arthrotomy and did find dark fluid in the arthrotomy itself at the knee joint and so we sent off for Gram stain  and cultures from this area.  We then copiously irrigated the knee joint with 6 liters normal saline  solution, removing as much synovium as we could from around the knee and thoroughly irrigated the knee.  We removed the previous poly liner and did exchange the poly liner with a new [employer] Triathlon constrained insert with 13 mm thickness for a size 4  tibial tray.  We then dried the knee real well and placed vancomycin powder in the arthrotomy.  We closed the arthrotomy with  interrupted #1 PDS suture followed by 0 Vicryl to close deep tissue, 2-0 Vicryl subcutaneous tissue, 2-0 nylon was used to close  the skin.  An incisional VAC was placed as well.  She was awakened, extubated, and taken to recovery room in stable condition with all final counts being correct.  No complications noted.  Postoperatively, I believe that she will need at least an  Infectious Disease consult and 6 weeks of IV antibiotics in hopes of maintaining survivorship of the knee with a primary knee replacement since it has only been 83 weeks old.   PUS D: 01/06/2021 5:44:31 pm T: 01/07/2021 3:06:00 am  JOB: [id number]/ [id number]

## 2021-01-07 NOTE — Progress Notes (Signed)
Secure chat sent to Nija Seller to notify plan on PICC placement on Sunday 12/4.

## 2021-01-07 NOTE — Progress Notes (Addendum)
PROGRESS NOTE    [name]  WUJ:[id number] DOB: 12-28-1960 DOA: 01/03/2021 PCP: Mateo Flow, MD   Brief Narrative: 60 year old with past medical history significant for morbid obesity, PAF, OSA, history of PE with recent total knee replacement on the right.  She underwent right total knee arthroplasty in December 23, 9560 complicated by A. fib RVR requiring prolonged hospitalization.  She has had significant pain and difficulty with mobility but did not want to go to rehab and was discharged home.  She presented due to worsening shortness of breath on exertion.  She also had episode of orthostatic hypotension at home.  He has been taking Xarelto. She has been evaluated by orthopedic, who is recommending IV antibiotics for mild knee cellulitis. Underwent a CT angio chest which was negative for PE, Doppler lower extremity negative for DVT.  Assessment & Plan:   Principal Problem:   Dyspnea Active Problems:   Paroxysmal atrial fibrillation (HCC)   Status post total right knee replacement   Tachypnea   Cellulitis of right knee   Chronic anticoagulation   1-s/p total Right  knee replacement,  superimposed cellulitis right knee drainage, possible infection status post TKA -Now on IV antibiotics.  -Underwent irrigation and debridement of right knee 12/02 due to bleeding and concern for infection.  -Culture growing gram-positive cocci -Continue with IV vancomycin and cefepime. -Ortho recommending prolonged course of IV antibiotics.  Will need  ID consult  2-Dyspnea on exertion, Tachypnea: CTA negative for PE.  She was premedicated due to allergy to contrast. Continue with nebulizer.  Doppler lower extremity negative for DVT Echo Normal EF, LVF.  No Significant drop in hemoglobin. EKG: Sinus rhythm. Troponin negative. Benefit from rehab Schedule albuterol.  Suspect deconditioning. Oxygen sat normal on ambulation with OT>   3-Paroxysmal A. fib: Continue with metoprolol.  EKG  sinus rhythm. Holding xarelto due to Right  knee hematoma.  Xarelto resume today   4-Chronic anticoagulation; Holding xarelto due to knee hematoma, bleeding.  5-Anemia; monitor. Started ferrous sulfate.  Hd down to 8.9. monitor.   Estimated body mass index is 50.75 kg/m as calculated from the following:   Height as of 12/23/20: 5\' 6"  (1.676 m).   Weight as of 12/23/20: 142.6 kg.   DVT prophylaxis: SCD Code Status: Full code Family Communication: care discussed with patient, husband at bedside.  Disposition Plan:  Status is: Inpatient  Remains inpatient appropriate because: Patient admitted with dyspnea on exertion, recent total knee replacement, might need placement.        Consultants:  Dr. Ninfa Linden Procedures:  ECHO  Antimicrobials:    Subjective: Wound vac in place. Report some pain.  Dr Ninfa Linden informed her she will need long course of IV antibiotics.   Objective: Vitals:   01/06/21 1931 01/06/21 1959 01/07/21 0401 01/07/21 0922  BP: 131/65 119/65 113/61 (!) 109/58  Pulse: 64 64 (!) 59 66  Resp: 13 18 18 18   Temp: 98.7 F (37.1 C) (!) 97.5 F (36.4 C) (!) 97.5 F (36.4 C) (!) 97.4 F (36.3 C)  TempSrc:  Oral Oral Oral  SpO2: 99% 96% 98% 95%    Intake/Output Summary (Last 24 hours) at 01/07/2021 1425 Last data filed at 01/07/2021 1100 Gross per 24 hour  Intake 2320 ml  Output 2475 ml  Net -155 ml    There were no vitals filed for this visit.  Examination:  General exam: NAD Respiratory system: CTA Cardiovascular system: S,1 S  2RRR Gastrointestinal system:B present, soft nt Central  nervous system: alert Extremities: Right knee with clean dressing, wound vac  Data Reviewed: I have personally reviewed following labs and imaging studies  CBC: Recent Labs  Lab 01/03/21 1012 01/04/21 0339 01/05/21 0907 01/06/21 0734 01/07/21 0322  WBC 9.2 8.0 10.4 6.7 11.4*  NEUTROABS 6.9  --   --   --   --   HGB 10.3* 9.7* 9.6* 9.8* 8.9*  HCT 34.9*  31.7* 31.8* 32.8* 30.1*  MCV 81.4 81.3 80.7 79.6* 80.3  PLT 368 271 340 349 644    Basic Metabolic Panel: Recent Labs  Lab 01/03/21 1012 01/04/21 0339 01/06/21 0734  NA 137 135 137  K 4.3 3.9 4.3  CL 102 104 107  CO2 27 24 23   GLUCOSE 98 113* 72  BUN 7* 6* 11  CREATININE 0.67 0.60 0.69  CALCIUM 8.5* 8.3* 8.2*    GFR: CrCl cannot be calculated (Unknown ideal weight.). Liver Function Tests: Recent Labs  Lab 01/03/21 1012  AST 30  ALT 20  ALKPHOS 55  BILITOT 0.7  PROT 6.2*  ALBUMIN 2.1*    No results for input(s): LIPASE, AMYLASE in the last 168 hours. No results for input(s): AMMONIA in the last 168 hours. Coagulation Profile: No results for input(s): INR, PROTIME in the last 168 hours. Cardiac Enzymes: Recent Labs  Lab 01/03/21 1638  CKTOTAL 142    BNP (last 3 results) No results for input(s): PROBNP in the last 8760 hours. HbA1C: No results for input(s): HGBA1C in the last 72 hours. CBG: No results for input(s): GLUCAP in the last 168 hours. Lipid Profile: No results for input(s): CHOL, HDL, LDLCALC, TRIG, CHOLHDL, LDLDIRECT in the last 72 hours. Thyroid Function Tests: No results for input(s): TSH, T4TOTAL, FREET4, T3FREE, THYROIDAB in the last 72 hours. Anemia Panel: No results for input(s): VITAMINB12, FOLATE, FERRITIN, TIBC, IRON, RETICCTPCT in the last 72 hours. Sepsis Labs: No results for input(s): PROCALCITON, LATICACIDVEN in the last 168 hours.  Recent Results (from the past 240 hour(s))  Resp Panel by RT-PCR (Flu A&B, Covid) Nasopharyngeal Swab     Status: None   Collection Time: 01/03/21  4:10 PM   Specimen: Nasopharyngeal Swab; Nasopharyngeal(NP) swabs in vial transport medium  Result Value Ref Range Status   SARS Coronavirus 2 by RT PCR NEGATIVE NEGATIVE Final    Comment: (NOTE) SARS-CoV-2 target nucleic acids are NOT DETECTED.  The SARS-CoV-2 RNA is generally detectable in upper respiratory specimens during the acute phase of  infection. The lowest concentration of SARS-CoV-2 viral copies this assay can detect is 138 copies/mL. A negative result does not preclude SARS-Cov-2 infection and should not be used as the sole basis for treatment or other patient management decisions. A negative result may occur with  improper specimen collection/handling, submission of specimen other than nasopharyngeal swab, presence of viral mutation(s) within the areas targeted by this assay, and inadequate number of viral copies(<138 copies/mL). A negative result must be combined with clinical observations, patient history, and epidemiological information. The expected result is Negative.  Fact Sheet for Patients:  EntrepreneurPulse.com.au  Fact Sheet for Healthcare Providers:  IncredibleEmployment.be  This test is no t yet approved or cleared by the [place] FDA and  has been authorized for detection and/or diagnosis of SARS-CoV-2 by FDA under an Emergency Use Authorization (EUA). This EUA will remain  in effect (meaning this test can be used) for the duration of the COVID-19 declaration under Section 564(b)(1) of the Act, 21 U.S.C.section 360bbb-3(b)(1), unless the authorization is terminated  or revoked sooner.       Influenza A by PCR NEGATIVE NEGATIVE Final   Influenza B by PCR NEGATIVE NEGATIVE Final    Comment: (NOTE) The Xpert Xpress SARS-CoV-2/FLU/RSV plus assay is intended as an aid in the diagnosis of influenza from Nasopharyngeal swab specimens and should not be used as a sole basis for treatment. Nasal washings and aspirates are unacceptable for Xpert Xpress SARS-CoV-2/FLU/RSV testing.  Fact Sheet for Patients: EntrepreneurPulse.com.au  Fact Sheet for Healthcare Providers: IncredibleEmployment.be  This test is not yet approved or cleared by the [place] FDA and has been authorized for detection and/or diagnosis of SARS-CoV-2  by FDA under an Emergency Use Authorization (EUA). This EUA will remain in effect (meaning this test can be used) for the duration of the COVID-19 declaration under Section 564(b)(1) of the Act, 21 U.S.C. section 360bbb-3(b)(1), unless the authorization is terminated or revoked.  Performed at [facility], [place] [address], [place], [place] [address]   Urine Culture     Status: Abnormal   Collection Time: 01/03/21  6:19 PM   Specimen: Urine, Clean Catch  Result Value Ref Range Status   Specimen Description URINE, CLEAN CATCH  Final   Special Requests   Final    NONE Performed at [facility], [place] [address], [place], [place] [address]    Culture MULTIPLE SPECIES PRESENT, SUGGEST RECOLLECTION (A)  Final   Report Status 01/04/2021 FINAL  Final  Aerobic/Anaerobic Culture w Gram Stain (surgical/deep wound)     Status: None (Preliminary result)   Collection Time: 01/06/21  4:35 PM   Specimen: PATH Cytology Misc. fluid; Body Fluid  Result Value Ref Range Status   Specimen Description FLUID RIGHT KNEE  Final   Special Requests SAMPLE A  Final   Gram Stain   Final    FEW WBC PRESENT, PREDOMINANTLY MONONUCLEAR RARE GRAM POSITIVE COCCI Performed at [facility], [place] [address] [place], [place] [address]    Culture PENDING  Incomplete   Report Status PENDING  Incomplete  Aerobic/Anaerobic Culture w Gram Stain (surgical/deep wound)     Status: None (Preliminary result)   Collection Time: 01/06/21  4:38 PM   Specimen: PATH Cytology Misc. fluid; Body Fluid  Result Value Ref Range Status   Specimen Description JOINT FLUID RIGHT KNEE  Final   Special Requests SAMPLE A  Final   Gram Stain   Final    MODERATE WBC PRESENT, PREDOMINANTLY MONONUCLEAR FEW GRAM POSITIVE COCCI Performed at [facility], [place] [address], [place], [place] [address]    Culture PENDING  Incomplete   Report Status PENDING  Incomplete          Radiology Studies: [place] EKG SITE  RITE  Result Date: 01/07/2021 If Site Rite image not attached, placement could not be confirmed due to current cardiac rhythm.       Scheduled Meds:  Chlorhexidine Gluconate Cloth  6 each Topical Q0600   ferrous sulfate  325 mg Oral Q breakfast   fesoterodine  4 mg Oral Daily   folic acid  1 mg Oral Daily   metoprolol succinate  50 mg Oral Daily   multivitamin with minerals  1 tablet Oral Daily   pantoprazole  40 mg Oral Daily   rivaroxaban  20 mg Oral Q supper   saccharomyces boulardii  250 mg Oral BID   Continuous Infusions:  ceFEPime (MAXIPIME) IV 2 g (01/07/21 0640)   vancomycin  LOS: 4 days    Time spent: 35 minutes    Yvana Samonte A Jachob Mcclean, MD [facility]   If 7PM-7AM, please contact night-coverage [email or web address]  01/07/2021, 2:25 PM

## 2021-01-07 NOTE — Plan of Care (Signed)
  Problem: Clinical Measurements: Goal: Respiratory complications will improve Outcome: Adequate for Discharge   

## 2021-01-07 NOTE — Progress Notes (Signed)
Patient ID: [name], female   DOB: February 28, 1960, 60 y.o.   MRN: [id number] I told [name] to the operating room last evening.  We thoroughly washed out her right total knee arthroplasty and changed out the polyliner.  It was worrisome for a deep infection in the joint.  She is 2 weeks postop from her initial primary right total knee arthroplasty.  The Gram stain did show few gram-positive cocci.  With that being said, she will need a PICC line for long-term IV antibiotics.  Infectious disease will be consulted for their recommendations as well.  I talked with the patient at length in detail about this.  She does have her right knee incision closed and I have an incisional VAC on top of this which I will leave on for the next few days.  I appreciate greatly the hospitalist service taking care of this patient as well.

## 2021-01-08 DIAGNOSIS — R0609 Other forms of dyspnea: Secondary | ICD-10-CM | POA: Diagnosis not present

## 2021-01-08 LAB — CBC
HCT: 27.9 % — ABNORMAL LOW (ref 36.0–46.0)
Hemoglobin: 8.2 g/dL — ABNORMAL LOW (ref 12.0–15.0)
MCH: 23.9 pg — ABNORMAL LOW (ref 26.0–34.0)
MCHC: 29.4 g/dL — ABNORMAL LOW (ref 30.0–36.0)
MCV: 81.3 fL (ref 80.0–100.0)
Platelets: 342 10*3/uL (ref 150–400)
RBC: 3.43 MIL/uL — ABNORMAL LOW (ref 3.87–5.11)
RDW: 18.4 % — ABNORMAL HIGH (ref 11.5–15.5)
WBC: 9 10*3/uL (ref 4.0–10.5)
nRBC: 0 % (ref 0.0–0.2)

## 2021-01-08 LAB — SEDIMENTATION RATE: Sed Rate: 52 mm/hr — ABNORMAL HIGH (ref 0–22)

## 2021-01-08 LAB — BASIC METABOLIC PANEL
Anion gap: 5 (ref 5–15)
BUN: 10 mg/dL (ref 8–23)
CO2: 26 mmol/L (ref 22–32)
Calcium: 8 mg/dL — ABNORMAL LOW (ref 8.9–10.3)
Chloride: 107 mmol/L (ref 98–111)
Creatinine, Ser: 0.55 mg/dL (ref 0.44–1.00)
GFR, Estimated: >60 mL/min (ref >60)
Glucose, Bld: 83 mg/dL (ref 70–99)
Potassium: 4.1 mmol/L (ref 3.5–5.1)
Sodium: 138 mmol/L (ref 135–145)

## 2021-01-08 LAB — C-REACTIVE PROTEIN: CRP: 6.1 mg/dL — ABNORMAL HIGH (ref <1.0)

## 2021-01-08 MED ORDER — SODIUM CHLORIDE 0.9% FLUSH: 10.0000 mL | INTRAVENOUS | Status: DC | PRN

## 2021-01-08 MED ORDER — SODIUM CHLORIDE 0.9% FLUSH
10.0000 mL | Freq: Two times a day (BID) | INTRAVENOUS | Status: DC
Start: 2021-01-08 — End: 2021-01-13
  Administered 2021-01-11: 20 mL
  Administered 2021-01-12 (×2): 10 mL

## 2021-01-08 NOTE — Progress Notes (Signed)
Pharmacy Antibiotic Note- Follow-up  [name] is a 60 y.o. female admitted on 01/03/2021 with shortness of breath.  Hospital course has been complicated by cellulitis and possible wound infection  at previous R knee surgical site.  Patient is s/p R knee replacement 11/18.  Pharmacy has been consulted for Vancomycin and Cefepime dosing.  Plan for return to [facility] for I&D.  Of note, pt was on PO Bactrim 11/29 >> 12/1.  Plan: Vancomycin 2500mg  IV x 1 dose Vancomycin 2000mg  IV q24h (calculated AUC 456, SCr 0.8, Vd 0.5)  Vancomycin peak and trough level with dose due 12/4 at 1800 AUC calculation 01/09/2021 when trough level is resulted  Cefepime 2gm IV q8h Follow-up culture data, clinical plans.  Vancomycin levels as indicated.     Temp (24hrs), Avg:97.8 F (36.6 C), Min:97.4 F (36.3 C), Max:98.1 F (36.7 C)  Recent Labs  Lab 01/03/21 1012 01/04/21 0339 01/05/21 0907 01/06/21 0734 01/07/21 0322 01/08/21 0313  WBC 9.2 8.0 10.4 6.7 11.4* 9.0  CREATININE 0.67 0.60  --  0.69  --  0.55     CrCl cannot be calculated (Unknown ideal weight.).    Allergies  Allergen Reactions   Contrast Media [Iodinated Diagnostic Agents] Shortness Of Breath and Other (See Comments)    APNEA   Penicillins Itching and Rash    PATIENT HAD A PCN REACTION WITH IMMEDIATE RASH, FACIAL/TONGUE/THROAT SWELLING, SOB, OR LIGHTHEADEDNESS WITH HYPOTENSION:  #  #  #  YES  #  #  #   Has patient had a PCN reaction causing severe rash involving mucus membranes or skin necrosis: NO Has patient had a PCN reaction that required hospitalization NO Has patient had a PCN reaction occurring within the last 10 years: NO   Ancef [Cefazolin Sodium] Nausea And Vomiting    Tolerates IV    Antimicrobials this admission: Bactrim 11/29 >> 12/01 Vanc 12/1 >> Cefepime 12/1 >>  Dose adjustments this admission:   Microbiology results: Recent Results (from the past 240 hour(s))  Resp Panel by RT-PCR (Flu A&B,  Covid) Nasopharyngeal Swab     Status: None   Collection Time: 01/03/21  4:10 PM   Specimen: Nasopharyngeal Swab; Nasopharyngeal(NP) swabs in vial transport medium  Result Value Ref Range Status   SARS Coronavirus 2 by RT PCR NEGATIVE NEGATIVE Final    Comment: (NOTE) SARS-CoV-2 target nucleic acids are NOT DETECTED.  The SARS-CoV-2 RNA is generally detectable in upper respiratory specimens during the acute phase of infection. The lowest concentration of SARS-CoV-2 viral copies this assay can detect is 138 copies/mL. A negative result does not preclude SARS-Cov-2 infection and should not be used as the sole basis for treatment or other patient management decisions. A negative result may occur with  improper specimen collection/handling, submission of specimen other than nasopharyngeal swab, presence of viral mutation(s) within the areas targeted by this assay, and inadequate number of viral copies(<138 copies/mL). A negative result must be combined with clinical observations, patient history, and epidemiological information. The expected result is Negative.  Fact Sheet for Patients:  EntrepreneurPulse.com.au  Fact Sheet for Healthcare Providers:  IncredibleEmployment.be  This test is no t yet approved or cleared by the [place] FDA and  has been authorized for detection and/or diagnosis of SARS-CoV-2 by FDA under an Emergency Use Authorization (EUA). This EUA will remain  in effect (meaning this test can be used) for the duration of the COVID-19 declaration under Section 564(b)(1) of the Act, 21 U.S.C.section 360bbb-3(b)(1), unless the  authorization is terminated  or revoked sooner.       Influenza A by PCR NEGATIVE NEGATIVE Final   Influenza B by PCR NEGATIVE NEGATIVE Final    Comment: (NOTE) The Xpert Xpress SARS-CoV-2/FLU/RSV plus assay is intended as an aid in the diagnosis of influenza from Nasopharyngeal swab specimens and should  not be used as a sole basis for treatment. Nasal washings and aspirates are unacceptable for Xpert Xpress SARS-CoV-2/FLU/RSV testing.  Fact Sheet for Patients: EntrepreneurPulse.com.au  Fact Sheet for Healthcare Providers: IncredibleEmployment.be  This test is not yet approved or cleared by the [place] FDA and has been authorized for detection and/or diagnosis of SARS-CoV-2 by FDA under an Emergency Use Authorization (EUA). This EUA will remain in effect (meaning this test can be used) for the duration of the COVID-19 declaration under Section 564(b)(1) of the Act, 21 U.S.C. section 360bbb-3(b)(1), unless the authorization is terminated or revoked.  Performed at [facility], [place] [address] [place], [place] [address]   Urine Culture     Status: Abnormal   Collection Time: 01/03/21  6:19 PM   Specimen: Urine, Clean Catch  Result Value Ref Range Status   Specimen Description URINE, CLEAN CATCH  Final   Special Requests   Final    NONE Performed at [facility], [place] [address], [place], [place] [address]    Culture MULTIPLE SPECIES PRESENT, SUGGEST RECOLLECTION (A)  Final   Report Status 01/04/2021 FINAL  Final  Aerobic/Anaerobic Culture w Gram Stain (surgical/deep wound)     Status: None (Preliminary result)   Collection Time: 01/06/21  4:35 PM   Specimen: PATH Cytology Misc. fluid; Body Fluid  Result Value Ref Range Status   Specimen Description FLUID RIGHT KNEE  Final   Special Requests SAMPLE A  Final   Gram Stain   Final    FEW WBC PRESENT, PREDOMINANTLY MONONUCLEAR RARE GRAM POSITIVE COCCI Performed at [facility], [place] [address] [place], [place] [address]    Culture PENDING  Incomplete   Report Status PENDING  Incomplete  Aerobic/Anaerobic Culture w Gram Stain (surgical/deep wound)     Status: None (Preliminary result)   Collection Time: 01/06/21  4:38 PM   Specimen: PATH Cytology Misc. fluid; Body Fluid   Result Value Ref Range Status   Specimen Description JOINT FLUID RIGHT KNEE  Final   Special Requests SAMPLE A  Final   Gram Stain   Final    MODERATE WBC PRESENT, PREDOMINANTLY MONONUCLEAR FEW GRAM POSITIVE COCCI Performed at [facility], [place] [address] [place], [place] [address]    Culture PENDING  Incomplete   Report Status PENDING  Incomplete     Thank you for allowing pharmacy to be a part of this patient's care.  Ciaran Begay BS, PharmD, BCPS Clinical Pharmacist **Pharmacist phone directory can be found on [email or web address] listed under [place]. 01/08/2021 8:49 AM

## 2021-01-08 NOTE — Progress Notes (Signed)
Patient ID: [name], female   DOB: [birth year], 60 y.o.   MRN: [id number] I just had a good conversation with [name] at the bedside.  There was some confusion on her part and probably my lack of communicating appropriately that she actually has a closed incision over her right knee and an incisional VAC just to help with drainage.  She felt that she had an open wound that the [facility] was on.  She was relieved to hear that we were not planning a return trip to the operating room and that I would just take the [facility] off at the bedside likely tomorrow evening.  We are still awaiting final cultures and sensitivities from her right total knee infection.  She can be up from a physical therapy standpoint for mobility and weightbearing as tolerated.  She is likely going to need some type of short-term skilled nursing or rehab placement.

## 2021-01-08 NOTE — Plan of Care (Signed)

## 2021-01-08 NOTE — Progress Notes (Signed)
PROGRESS NOTE    [name]  WUJ:[id number] DOB: 08/31/1960 DOA: 01/03/2021 PCP: Mateo Flow, MD   Brief Narrative: 60 year old with past medical history significant for morbid obesity, PAF, OSA, history of PE with recent total knee replacement on the right.  She underwent right total knee arthroplasty in December 23, 9560 complicated by A. fib RVR requiring prolonged hospitalization.  She has had significant pain and difficulty with mobility but did not want to go to rehab and was discharged home.  She presented due to worsening shortness of breath on exertion.  She also had episode of orthostatic hypotension at home.  He has been taking Xarelto. She has been evaluated by orthopedic, who is recommending IV antibiotics for mild knee cellulitis. Underwent a CT angio chest which was negative for PE, Doppler lower extremity negative for DVT.  Assessment & Plan:   Principal Problem:   Dyspnea Active Problems:   Paroxysmal atrial fibrillation (HCC)   Status post total right knee replacement   Tachypnea   Cellulitis of right knee   Chronic anticoagulation   1-s/p total Right  knee replacement,  superimposed cellulitis right knee drainage, Infection status post TKA -Now on IV antibiotics.  -Underwent irrigation and debridement of right knee 12/02 due to bleeding and concern for infection.  -Culture growing gram-positive cocci. Pending  -Continue with IV vancomycin and ceftriaxone.  -Ortho recommending prolonged course of IV antibiotics.   -She will need 6 weeks of IV antibiotics follow by 3 to 6 month oral treatment.    2-Dyspnea on exertion, Tachypnea: CTA negative for PE.  She was premedicated due to allergy to contrast. Continue with nebulizer.  Doppler lower extremity negative for DVT Echo Normal EF, LVF.  No Significant drop in hemoglobin. EKG: Sinus rhythm. Troponin negative. Benefit from rehab Schedule albuterol.  Suspect deconditioning. Oxygen sat normal on  ambulation with OT>   3-Paroxysmal A. fib: Continue with metoprolol.  EKG sinus rhythm. Holding xarelto due to Right  knee hematoma.  Continue with Xarelto  4-Chronic anticoagulation; Back on xarelto.   5-Anemia; monitor. Started ferrous sulfate.  Hd down to 8.9--8.2  Estimated body mass index is 50.75 kg/m as calculated from the following:   Height as of 12/23/20: 5\' 6"  (1.676 m).   Weight as of 12/23/20: 142.6 kg.   DVT prophylaxis: SCD Code Status: Full code Family Communication: care discussed with patient, husband at bedside.  Disposition Plan:  Status is: Inpatient  Remains inpatient appropriate because: Patient admitted with dyspnea on exertion, recent total knee replacement, might need placement.        Consultants:  Dr. Ninfa Linden Procedures:  ECHO  Antimicrobials:    Subjective: She denies worsening pain. She is concern with the infection in her knee.  She will discussed further with Dr Ninfa Linden.   Objective: Vitals:   01/07/21 1628 01/07/21 2029 01/08/21 0616 01/08/21 0910  BP: (!) 117/52 (!) 104/48 130/60 127/62  Pulse: 72 74 67 68  Resp: 18 17 18 18   Temp: 98.1 F (36.7 C) 97.9 F (36.6 C) 97.8 F (36.6 C) 98 F (36.7 C)  TempSrc: Oral Oral Oral Oral  SpO2: 97% 97% 96% 98%    Intake/Output Summary (Last 24 hours) at 01/08/2021 1305 Last data filed at 01/08/2021 1252 Gross per 24 hour  Intake 720 ml  Output 3515 ml  Net -2795 ml    There were no vitals filed for this visit.  Examination:  General exam: NAD Respiratory system: CTA Cardiovascular system: S 1,  S 2 RRR Gastrointestinal system: BS present, soft, nt  Central nervous system: Alert Extremities: Right knee with clean dressing, wound vac  Data Reviewed: I have personally reviewed following labs and imaging studies  CBC: Recent Labs  Lab 01/03/21 1012 01/04/21 0339 01/05/21 0907 01/06/21 0734 01/07/21 0322 01/08/21 0313  WBC 9.2 8.0 10.4 6.7 11.4* 9.0  NEUTROABS 6.9   --   --   --   --   --   HGB 10.3* 9.7* 9.6* 9.8* 8.9* 8.2*  HCT 34.9* 31.7* 31.8* 32.8* 30.1* 27.9*  MCV 81.4 81.3 80.7 79.6* 80.3 81.3  PLT 368 271 340 349 395 376    Basic Metabolic Panel: Recent Labs  Lab 01/03/21 1012 01/04/21 0339 01/06/21 0734 01/08/21 0313  NA 137 135 137 138  K 4.3 3.9 4.3 4.1  CL 102 104 107 107  CO2 27 24 23 26   GLUCOSE 98 113* 72 83  BUN 7* 6* 11 10  CREATININE 0.67 0.60 0.69 0.55  CALCIUM 8.5* 8.3* 8.2* 8.0*    GFR: CrCl cannot be calculated (Unknown ideal weight.). Liver Function Tests: Recent Labs  Lab 01/03/21 1012  AST 30  ALT 20  ALKPHOS 55  BILITOT 0.7  PROT 6.2*  ALBUMIN 2.1*    No results for input(s): LIPASE, AMYLASE in the last 168 hours. No results for input(s): AMMONIA in the last 168 hours. Coagulation Profile: No results for input(s): INR, PROTIME in the last 168 hours. Cardiac Enzymes: Recent Labs  Lab 01/03/21 1638  CKTOTAL 142    BNP (last 3 results) No results for input(s): PROBNP in the last 8760 hours. HbA1C: No results for input(s): HGBA1C in the last 72 hours. CBG: No results for input(s): GLUCAP in the last 168 hours. Lipid Profile: No results for input(s): CHOL, HDL, LDLCALC, TRIG, CHOLHDL, LDLDIRECT in the last 72 hours. Thyroid Function Tests: No results for input(s): TSH, T4TOTAL, FREET4, T3FREE, THYROIDAB in the last 72 hours. Anemia Panel: No results for input(s): VITAMINB12, FOLATE, FERRITIN, TIBC, IRON, RETICCTPCT in the last 72 hours. Sepsis Labs: No results for input(s): PROCALCITON, LATICACIDVEN in the last 168 hours.  Recent Results (from the past 240 hour(s))  Resp Panel by RT-PCR (Flu A&B, Covid) Nasopharyngeal Swab     Status: None   Collection Time: 01/03/21  4:10 PM   Specimen: Nasopharyngeal Swab; Nasopharyngeal(NP) swabs in vial transport medium  Result Value Ref Range Status   SARS Coronavirus 2 by RT PCR NEGATIVE NEGATIVE Final    Comment: (NOTE) SARS-CoV-2 target nucleic  acids are NOT DETECTED.  The SARS-CoV-2 RNA is generally detectable in upper respiratory specimens during the acute phase of infection. The lowest concentration of SARS-CoV-2 viral copies this assay can detect is 138 copies/mL. A negative result does not preclude SARS-Cov-2 infection and should not be used as the sole basis for treatment or other patient management decisions. A negative result may occur with  improper specimen collection/handling, submission of specimen other than nasopharyngeal swab, presence of viral mutation(s) within the areas targeted by this assay, and inadequate number of viral copies(<138 copies/mL). A negative result must be combined with clinical observations, patient history, and epidemiological information. The expected result is Negative.  Fact Sheet for Patients:  EntrepreneurPulse.com.au  Fact Sheet for Healthcare Providers:  IncredibleEmployment.be  This test is no t yet approved or cleared by the [place] FDA and  has been authorized for detection and/or diagnosis of SARS-CoV-2 by FDA under an Emergency Use Authorization (EUA). This EUA will  remain  in effect (meaning this test can be used) for the duration of the COVID-19 declaration under Section 564(b)(1) of the Act, 21 U.S.C.section 360bbb-3(b)(1), unless the authorization is terminated  or revoked sooner.       Influenza A by PCR NEGATIVE NEGATIVE Final   Influenza B by PCR NEGATIVE NEGATIVE Final    Comment: (NOTE) The Xpert Xpress SARS-CoV-2/FLU/RSV plus assay is intended as an aid in the diagnosis of influenza from Nasopharyngeal swab specimens and should not be used as a sole basis for treatment. Nasal washings and aspirates are unacceptable for Xpert Xpress SARS-CoV-2/FLU/RSV testing.  Fact Sheet for Patients: EntrepreneurPulse.com.au  Fact Sheet for Healthcare Providers: IncredibleEmployment.be  This  test is not yet approved or cleared by the [place] FDA and has been authorized for detection and/or diagnosis of SARS-CoV-2 by FDA under an Emergency Use Authorization (EUA). This EUA will remain in effect (meaning this test can be used) for the duration of the COVID-19 declaration under Section 564(b)(1) of the Act, 21 U.S.C. section 360bbb-3(b)(1), unless the authorization is terminated or revoked.  Performed at [facility], [place] [address], [place], [place] [address]   Urine Culture     Status: Abnormal   Collection Time: 01/03/21  6:19 PM   Specimen: Urine, Clean Catch  Result Value Ref Range Status   Specimen Description URINE, CLEAN CATCH  Final   Special Requests   Final    NONE Performed at [facility], [place] [address] [place], [place] [address]    Culture MULTIPLE SPECIES PRESENT, SUGGEST RECOLLECTION (A)  Final   Report Status 01/04/2021 FINAL  Final  Aerobic/Anaerobic Culture w Gram Stain (surgical/deep wound)     Status: None (Preliminary result)   Collection Time: 01/06/21  4:35 PM   Specimen: PATH Cytology Misc. fluid; Body Fluid  Result Value Ref Range Status   Specimen Description FLUID RIGHT KNEE  Final   Special Requests SAMPLE A  Final   Gram Stain   Final    FEW WBC PRESENT, PREDOMINANTLY MONONUCLEAR RARE GRAM POSITIVE COCCI    Culture   Final    CULTURE REINCUBATED FOR BETTER GROWTH Performed at [facility], [place] [address] [place], [place] [address]    Report Status PENDING  Incomplete  Aerobic/Anaerobic Culture w Gram Stain (surgical/deep wound)     Status: None (Preliminary result)   Collection Time: 01/06/21  4:38 PM   Specimen: PATH Cytology Misc. fluid; Body Fluid  Result Value Ref Range Status   Specimen Description JOINT FLUID RIGHT KNEE  Final   Special Requests SAMPLE A  Final   Gram Stain   Final    MODERATE WBC PRESENT, PREDOMINANTLY MONONUCLEAR FEW GRAM POSITIVE COCCI    Culture   Final    CULTURE REINCUBATED  FOR BETTER GROWTH Performed at [facility], [place] [address] [place], [place] [address]    Report Status PENDING  Incomplete          Radiology Studies: [place] EKG SITE RITE  Result Date: 01/07/2021 If Site Rite image not attached, placement could not be confirmed due to current cardiac rhythm.       Scheduled Meds:  Chlorhexidine Gluconate Cloth  6 each Topical Q0600   ferrous sulfate  325 mg Oral Q breakfast   fesoterodine  4 mg Oral Daily   folic acid  1 mg Oral Daily   metoprolol succinate  50 mg Oral Daily   multivitamin with minerals  1 tablet Oral Daily   pantoprazole  40 mg Oral Daily   rivaroxaban  20 mg Oral Q supper   saccharomyces boulardii  250 mg Oral BID   sodium chloride flush  10-40 mL Intracatheter Q12H   Continuous Infusions:  cefTRIAXone (ROCEPHIN)  IV 2 g (01/07/21 1543)   vancomycin 2,000 mg (01/07/21 1751)      LOS: 5 days    Time spent: 35 minutes    Chaunice Obie A Keaton Beichner, MD [facility]   If 7PM-7AM, please contact night-coverage [email or web address]  01/08/2021, 1:05 PM

## 2021-01-08 NOTE — Progress Notes (Signed)
Physical Therapy Treatment Patient Details Name: [name] MRN: [id number] DOB: September 01, 1960 Today's Date: 01/08/2021   History of Present Illness 60 y.o. female presents to [facility] hospital on 01/03/2021 with reports of SOB and DOE. Pt underwent R TKA 53/61, complicated by Afib with RVR. Pt admitted for management of Afib and RLE cellulitis. OR 12/2 for R knee I&D, polyliner exchange, and wound vac placement. WER:[id number] obesity, PAF, OA, hx of PE, anemia, CHF    PT Comments    Pt in bed on arrival, agreeable to participation in therapy. She underwent I&D and polyliner exchange in OR 12/2. This is her first therapy session following the procedure. She performed LE exercises supine. Min assist bed mobility, min assist transfers, and min guard assist ambulation 5' with RW. Pt in recliner with feet elevated at end of session.    Recommendations for follow up therapy are one component of a multi-disciplinary discharge planning process, led by the attending physician.  Recommendations may be updated based on patient status, additional functional criteria and insurance authorization.  Follow Up Recommendations  Skilled nursing-short term rehab (<3 hours/day)     Assistance Recommended at Discharge Intermittent Supervision/Assistance  Equipment Recommendations  None recommended by PT    Recommendations for Other Services       Precautions / Restrictions Precautions Precautions: Fall;Knee;Other (comment) Precaution Comments: wound vac Restrictions RLE Weight Bearing: Weight bearing as tolerated     Mobility  Bed Mobility Overal bed mobility: Needs Assistance Bed Mobility: Supine to Sit     Supine to sit: HOB elevated;Min assist     General bed mobility comments: assistance for RLE, increased time    Transfers Overall transfer level: Needs assistance Equipment used: Rolling walker (2 wheels) Transfers: Sit to/from Stand Sit to Stand: Min assist                 Ambulation/Gait Ambulation/Gait assistance: Min guard Gait Distance (Feet): 5 Feet Assistive device: Rolling walker (2 wheels) Gait Pattern/deviations: Step-to pattern;Antalgic;Decreased stride length;Decreased weight shift to right Gait velocity: decreased     General Gait Details: distance limited by pain   Stairs             Wheelchair Mobility    Modified Rankin (Stroke Patients Only)       Balance Overall balance assessment: Needs assistance Sitting-balance support: No upper extremity supported;Feet supported Sitting balance-Leahy Scale: Good     Standing balance support: Bilateral upper extremity supported;Reliant on assistive device for balance;During functional activity Standing balance-Leahy Scale: Poor                              Cognition Arousal/Alertness: Awake/alert Behavior During Therapy: WFL for tasks assessed/performed Overall Cognitive Status: Within Functional Limits for tasks assessed                                          Exercises Total Joint Exercises Ankle Circles/Pumps: AROM;Both;20 reps;Supine Quad Sets: AROM;Right;10 reps;Supine Heel Slides: AAROM;Right;10 reps;Supine Hip ABduction/ADduction: AAROM;Right;10 reps;Supine Goniometric ROM: 5-30 degrees R knee    General Comments General comments (skin integrity, edema, etc.): Pt with c/o dizziness upon sitting in recliner. Vitals taken. HR 80. SpO2 95% on RA. BP 127/63.      Pertinent Vitals/Pain Pain Assessment: 0-10 Pain Score: 6  Pain Location: R knee Pain Descriptors / Indicators: Grimacing;Guarding;Sore;Tightness Pain  Intervention(s): Monitored during session;Repositioned;Limited activity within patient's tolerance    Home Living                          Prior Function            PT Goals (current goals can now be found in the care plan section) Acute Rehab PT Goals Patient Stated Goal: independence Progress towards PT  goals: Progressing toward goals    Frequency    Min 4X/week      PT Plan Current plan remains appropriate    Co-evaluation              AM-PAC PT "6 Clicks" Mobility   Outcome Measure  Help needed turning from your back to your side while in a flat bed without using bedrails?: A Little Help needed moving from lying on your back to sitting on the side of a flat bed without using bedrails?: A Little Help needed moving to and from a bed to a chair (including a wheelchair)?: A Little Help needed standing up from a chair using your arms (e.g., wheelchair or bedside chair)?: A Little Help needed to walk in hospital room?: A Little Help needed climbing 3-5 steps with a railing? : Total 6 Click Score: 16    End of Session Equipment Utilized During Treatment: Gait belt Activity Tolerance: Patient tolerated treatment well Patient left: in chair;with call bell/phone within reach Nurse Communication: Mobility status PT Visit Diagnosis: Other abnormalities of gait and mobility (R26.89);Pain Pain - Right/Left: Right Pain - part of body: Knee     Time: 8413-2440 PT Time Calculation (min) (ACUTE ONLY): 25 min  Charges:  $Gait Training: 8-22 mins $Therapeutic Exercise: 8-22 mins                     Lorrin Goodell, PT  Office # [phone] Pager [phone]    Lorriane Shire 01/08/2021, 3:24 PM

## 2021-01-08 NOTE — Progress Notes (Signed)
Peripherally Inserted Central Catheter Placement  The IV Nurse has discussed with the patient and/or persons authorized to consent for the patient, the purpose of this procedure and the potential benefits and risks involved with this procedure.  The benefits include less needle sticks, lab draws from the catheter, and the patient may be discharged home with the catheter. Risks include, but not limited to, infection, bleeding, blood clot (thrombus formation), and puncture of an artery; nerve damage and irregular heartbeat and possibility to perform a PICC exchange if needed/ordered by physician.  Alternatives to this procedure were also discussed.  Bard Power PICC patient education guide, fact sheet on infection prevention and patient information card has been provided to patient /or left at bedside.    PICC Placement Documentation  PICC Double Lumen 35/36/14 PICC Right Basilic 39 cm 0 cm (Active)  Indication for Insertion or Continuance of Line Home intravenous therapies (PICC only) 01/08/21 1123  Exposed Catheter (cm) 1 cm 01/08/21 1123  Site Assessment Clean;Intact;Dry 01/08/21 1123  Lumen #1 Status Flushed;Saline locked;Blood return noted 01/08/21 1123  Lumen #2 Status Flushed;Saline locked;Blood return noted 01/08/21 1123  Dressing Type Transparent;Securing device 01/08/21 1123  Dressing Status Clean;Dry;Intact 01/08/21 1123  Antimicrobial disc in place? Yes 01/08/21 1123  Safety Lock Not Applicable 43/15/40 0867  Line Care Connections checked and tightened 01/08/21 1123  Line Adjustment (NICU/IV Team Only) No 01/08/21 1123  Dressing Intervention New dressing 01/08/21 1123  Dressing Change Due 01/15/21 01/08/21 1123       Rolena Infante 01/08/2021, 11:24 AM

## 2021-01-09 DIAGNOSIS — R0609 Other forms of dyspnea: Secondary | ICD-10-CM | POA: Diagnosis not present

## 2021-01-09 LAB — CBC
HCT: 28.7 % — ABNORMAL LOW (ref 36.0–46.0)
Hemoglobin: 8.6 g/dL — ABNORMAL LOW (ref 12.0–15.0)
MCH: 24.7 pg — ABNORMAL LOW (ref 26.0–34.0)
MCHC: 30 g/dL (ref 30.0–36.0)
MCV: 82.5 fL (ref 80.0–100.0)
Platelets: 320 10*3/uL (ref 150–400)
RBC: 3.48 MIL/uL — ABNORMAL LOW (ref 3.87–5.11)
RDW: 18.8 % — ABNORMAL HIGH (ref 11.5–15.5)
WBC: 7.6 10*3/uL (ref 4.0–10.5)
nRBC: 0 % (ref 0.0–0.2)

## 2021-01-09 LAB — BASIC METABOLIC PANEL
Anion gap: 5 (ref 5–15)
BUN: 9 mg/dL (ref 8–23)
CO2: 24 mmol/L (ref 22–32)
Calcium: 8.2 mg/dL — ABNORMAL LOW (ref 8.9–10.3)
Chloride: 105 mmol/L (ref 98–111)
Creatinine, Ser: 0.48 mg/dL (ref 0.44–1.00)
GFR, Estimated: >60 mL/min (ref >60)
Glucose, Bld: 106 mg/dL — ABNORMAL HIGH (ref 70–99)
Potassium: 3.8 mmol/L (ref 3.5–5.1)
Sodium: 134 mmol/L — ABNORMAL LOW (ref 135–145)

## 2021-01-09 MED ORDER — SENNA 8.6 MG PO TABS
1.0000 | ORAL_TABLET | Freq: Two times a day (BID) | ORAL | Status: DC
  Administered 2021-01-09 – 2021-01-10 (×3): 8.6 mg via ORAL
  Filled 2021-01-09 (×7): qty 1

## 2021-01-09 MED ORDER — POLYETHYLENE GLYCOL 3350 17 G PO PACK
17.0000 g | PACK | Freq: Two times a day (BID) | ORAL | Status: DC
  Administered 2021-01-09 – 2021-01-10 (×3): 17 g via ORAL
  Filled 2021-01-09 (×6): qty 1

## 2021-01-09 MED ORDER — CHLORHEXIDINE GLUCONATE CLOTH 2 % EX PADS
6.0000 | MEDICATED_PAD | Freq: Every day | CUTANEOUS | Status: DC
  Administered 2021-01-10 – 2021-01-13 (×4): 6 via TOPICAL

## 2021-01-09 MED ORDER — SODIUM CHLORIDE 0.9 % IV SOLN
8.0000 mg/kg | Freq: Every day | INTRAVENOUS | Status: DC
  Administered 2021-01-09 – 2021-01-13 (×5): 750 mg via INTRAVENOUS
  Filled 2021-01-09 (×5): qty 15

## 2021-01-09 NOTE — Progress Notes (Signed)
PROGRESS NOTE    [name]  FYT:[id number] DOB: 09-Jan-1961 DOA: 01/03/2021 PCP: Mateo Flow, MD   Brief Narrative: 60 year old with past medical history significant for morbid obesity, PAF, OSA, history of PE with recent total knee replacement on the right.  She underwent right total knee arthroplasty in December 23, 1769 complicated by A. fib RVR requiring prolonged hospitalization.  She has had significant pain and difficulty with mobility but did not want to go to rehab and was discharged home.  She presented due to worsening shortness of breath on exertion.  She also had episode of orthostatic hypotension at home.  He has been taking Xarelto. She has been evaluated by orthopedic, who is recommending IV antibiotics for mild knee cellulitis. Underwent a CT angio chest which was negative for PE, Doppler lower extremity negative for DVT.  Assessment & Plan:   Principal Problem:   Dyspnea Active Problems:   Paroxysmal atrial fibrillation (HCC)   Status post total right knee replacement   Tachypnea   Cellulitis of right knee   Chronic anticoagulation   1-s/p total Right  knee replacement,  superimposed cellulitis right knee drainage, Infection status post TKA -Underwent irrigation and debridement of right knee 12/02 due to bleeding and concern for infection.  -Culture growing: MRSA.  -Continue with IV vancomycin and ceftriaxone.  -Ortho recommending prolonged course of IV antibiotics.   -She will need 6 weeks of IV antibiotics follow by 3 to 6 month oral treatment.  -Culture growing MRSA, ID to follow for recommendation for Rifampin.   2-Dyspnea on exertion, Tachypnea: CTA negative for PE.  She was premedicated due to allergy to contrast. Continue with nebulizer.  Doppler lower extremity negative for DVT Echo Normal EF, LVF.  No Significant drop in hemoglobin. EKG: Sinus rhythm. Troponin negative. Benefit from rehab Schedule albuterol.  Suspect deconditioning. Oxygen  sat normal on ambulation with OT>   3-Paroxysmal A. fib: Continue with metoprolol.  EKG sinus rhythm. Holding xarelto due to Right  knee hematoma.  Continue with Xarelto  4-Chronic anticoagulation; Back on xarelto.   5-Anemia; monitor. Started ferrous sulfate.  Hd down to 8.9--8.2. Hb pending/   6-Constipation; started on Miralax, and senna.   Estimated body mass index is 50.75 kg/m as calculated from the following:   Height as of 12/23/20: 5\' 6"  (1.676 m).   Weight as of 12/23/20: 142.6 kg.   DVT prophylaxis: SCD Code Status: Full code Family Communication: care discussed with patient, husband at bedside.  Disposition Plan:  Status is: Inpatient  Remains inpatient appropriate because: Patient admitted with dyspnea on exertion, recent total knee replacement. Awaiting SNF       Consultants:  Dr. Ninfa Linden Procedures:  ECHO  Antimicrobials:    Subjective: She had pain last night, pain is better now. \no BM in several days.  Objective: Vitals:   01/08/21 0910 01/08/21 1740 01/08/21 2026 01/09/21 0506  BP: 127/62 (!) 120/56 (!) 123/50 122/63  Pulse: 68 75 77 75  Resp: 18 18 17 19   Temp: 98 F (36.7 C) 98 F (36.7 C) 99.7 F (37.6 C) 98.6 F (37 C)  TempSrc: Oral  Oral Oral  SpO2: 98% 96% 97% 97%    Intake/Output Summary (Last 24 hours) at 01/09/2021 0736 Last data filed at 01/09/2021 0600 Gross per 24 hour  Intake 840 ml  Output 5400 ml  Net -4560 ml    There were no vitals filed for this visit.  Examination:  General exam: NAD Respiratory system:CTA Cardiovascular  system: S 1, S 2 RRR Gastrointestinal system: BBS present, soft, nt Central nervous system: Alert Extremities: Right knee with clean dressing, wound vac  Data Reviewed: I have personally reviewed following labs and imaging studies  CBC: Recent Labs  Lab 01/03/21 1012 01/04/21 0339 01/05/21 0907 01/06/21 0734 01/07/21 0322 01/08/21 0313  WBC 9.2 8.0 10.4 6.7 11.4* 9.0   NEUTROABS 6.9  --   --   --   --   --   HGB 10.3* 9.7* 9.6* 9.8* 8.9* 8.2*  HCT 34.9* 31.7* 31.8* 32.8* 30.1* 27.9*  MCV 81.4 81.3 80.7 79.6* 80.3 81.3  PLT 368 271 340 349 395 510    Basic Metabolic Panel: Recent Labs  Lab 01/03/21 1012 01/04/21 0339 01/06/21 0734 01/08/21 0313  NA 137 135 137 138  K 4.3 3.9 4.3 4.1  CL 102 104 107 107  CO2 27 24 23 26   GLUCOSE 98 113* 72 83  BUN 7* 6* 11 10  CREATININE 0.67 0.60 0.69 0.55  CALCIUM 8.5* 8.3* 8.2* 8.0*    GFR: CrCl cannot be calculated (Unknown ideal weight.). Liver Function Tests: Recent Labs  Lab 01/03/21 1012  AST 30  ALT 20  ALKPHOS 55  BILITOT 0.7  PROT 6.2*  ALBUMIN 2.1*    No results for input(s): LIPASE, AMYLASE in the last 168 hours. No results for input(s): AMMONIA in the last 168 hours. Coagulation Profile: No results for input(s): INR, PROTIME in the last 168 hours. Cardiac Enzymes: Recent Labs  Lab 01/03/21 1638  CKTOTAL 142    BNP (last 3 results) No results for input(s): PROBNP in the last 8760 hours. HbA1C: No results for input(s): HGBA1C in the last 72 hours. CBG: No results for input(s): GLUCAP in the last 168 hours. Lipid Profile: No results for input(s): CHOL, HDL, LDLCALC, TRIG, CHOLHDL, LDLDIRECT in the last 72 hours. Thyroid Function Tests: No results for input(s): TSH, T4TOTAL, FREET4, T3FREE, THYROIDAB in the last 72 hours. Anemia Panel: No results for input(s): VITAMINB12, FOLATE, FERRITIN, TIBC, IRON, RETICCTPCT in the last 72 hours. Sepsis Labs: No results for input(s): PROCALCITON, LATICACIDVEN in the last 168 hours.  Recent Results (from the past 240 hour(s))  Resp Panel by RT-PCR (Flu A&B, Covid) Nasopharyngeal Swab     Status: None   Collection Time: 01/03/21  4:10 PM   Specimen: Nasopharyngeal Swab; Nasopharyngeal(NP) swabs in vial transport medium  Result Value Ref Range Status   SARS Coronavirus 2 by RT PCR NEGATIVE NEGATIVE Final    Comment: (NOTE) SARS-CoV-2  target nucleic acids are NOT DETECTED.  The SARS-CoV-2 RNA is generally detectable in upper respiratory specimens during the acute phase of infection. The lowest concentration of SARS-CoV-2 viral copies this assay can detect is 138 copies/mL. A negative result does not preclude SARS-Cov-2 infection and should not be used as the sole basis for treatment or other patient management decisions. A negative result may occur with  improper specimen collection/handling, submission of specimen other than nasopharyngeal swab, presence of viral mutation(s) within the areas targeted by this assay, and inadequate number of viral copies(<138 copies/mL). A negative result must be combined with clinical observations, patient history, and epidemiological information. The expected result is Negative.  Fact Sheet for Patients:  EntrepreneurPulse.com.au  Fact Sheet for Healthcare Providers:  IncredibleEmployment.be  This test is no t yet approved or cleared by the [place] FDA and  has been authorized for detection and/or diagnosis of SARS-CoV-2 by FDA under an Emergency Use Authorization (EUA). This  EUA will remain  in effect (meaning this test can be used) for the duration of the COVID-19 declaration under Section 564(b)(1) of the Act, 21 U.S.C.section 360bbb-3(b)(1), unless the authorization is terminated  or revoked sooner.       Influenza A by PCR NEGATIVE NEGATIVE Final   Influenza B by PCR NEGATIVE NEGATIVE Final    Comment: (NOTE) The Xpert Xpress SARS-CoV-2/FLU/RSV plus assay is intended as an aid in the diagnosis of influenza from Nasopharyngeal swab specimens and should not be used as a sole basis for treatment. Nasal washings and aspirates are unacceptable for Xpert Xpress SARS-CoV-2/FLU/RSV testing.  Fact Sheet for Patients: EntrepreneurPulse.com.au  Fact Sheet for Healthcare  Providers: IncredibleEmployment.be  This test is not yet approved or cleared by the [place] FDA and has been authorized for detection and/or diagnosis of SARS-CoV-2 by FDA under an Emergency Use Authorization (EUA). This EUA will remain in effect (meaning this test can be used) for the duration of the COVID-19 declaration under Section 564(b)(1) of the Act, 21 U.S.C. section 360bbb-3(b)(1), unless the authorization is terminated or revoked.  Performed at [facility], [place] [address] [place], [place] [address]   Urine Culture     Status: Abnormal   Collection Time: 01/03/21  6:19 PM   Specimen: Urine, Clean Catch  Result Value Ref Range Status   Specimen Description URINE, CLEAN CATCH  Final   Special Requests   Final    NONE Performed at [facility], [place] [address], [place], [place] [address]    Culture MULTIPLE SPECIES PRESENT, SUGGEST RECOLLECTION (A)  Final   Report Status 01/04/2021 FINAL  Final  Aerobic/Anaerobic Culture w Gram Stain (surgical/deep wound)     Status: None (Preliminary result)   Collection Time: 01/06/21  4:35 PM   Specimen: PATH Cytology Misc. fluid; Body Fluid  Result Value Ref Range Status   Specimen Description FLUID RIGHT KNEE  Final   Special Requests SAMPLE A  Final   Gram Stain   Final    FEW WBC PRESENT, PREDOMINANTLY MONONUCLEAR RARE GRAM POSITIVE COCCI    Culture   Final    FEW STAPHYLOCOCCUS AUREUS CULTURE REINCUBATED FOR BETTER GROWTH Performed at [facility], [place] [address] [place], [place] [address]    Report Status PENDING  Incomplete  Aerobic/Anaerobic Culture w Gram Stain (surgical/deep wound)     Status: None (Preliminary result)   Collection Time: 01/06/21  4:38 PM   Specimen: PATH Cytology Misc. fluid; Body Fluid  Result Value Ref Range Status   Specimen Description JOINT FLUID RIGHT KNEE  Final   Special Requests SAMPLE A  Final   Gram Stain   Final    MODERATE WBC PRESENT,  PREDOMINANTLY MONONUCLEAR FEW GRAM POSITIVE COCCI    Culture   Final    MODERATE STAPHYLOCOCCUS AUREUS SUSCEPTIBILITIES TO FOLLOW Performed at [facility], [place] [address], [place], [place] [address]    Report Status PENDING  Incomplete          Radiology Studies: [place] EKG SITE RITE  Result Date: 01/07/2021 If Site Rite image not attached, placement could not be confirmed due to current cardiac rhythm.       Scheduled Meds:  Chlorhexidine Gluconate Cloth  6 each Topical Q0600   ferrous sulfate  325 mg Oral Q breakfast   fesoterodine  4 mg Oral Daily   folic acid  1 mg Oral Daily   metoprolol succinate  50 mg Oral  Daily   multivitamin with minerals  1 tablet Oral Daily   pantoprazole  40 mg Oral Daily   rivaroxaban  20 mg Oral Q supper   saccharomyces boulardii  250 mg Oral BID   sodium chloride flush  10-40 mL Intracatheter Q12H   Continuous Infusions:  cefTRIAXone (ROCEPHIN)  IV 2 g (01/08/21 1627)   vancomycin 2,000 mg (01/08/21 1841)      LOS: 6 days    Time spent: 35 minutes    Manual Navarra A Thor Nannini, MD [facility]   If 7PM-7AM, please contact night-coverage [email or web address]  01/09/2021, 7:36 AM

## 2021-01-09 NOTE — Progress Notes (Signed)
Occupational Therapy Treatment Patient Details Name: [name] MRN: [id number] DOB: 08-04-1960 Today's Date: 01/09/2021   History of present illness 60 y.o. female presents to [facility] hospital on 01/03/2021 with reports of SOB and DOE. Pt underwent R TKA 94/49, complicated by Afib with RVR. Pt admitted for management of Afib and RLE cellulitis. OR 12/2 for R knee I&D, polyliner exchange, and wound vac placement. QPR:FFMBWG obesity, PAF, OA, hx of PE, anemia, CHF   OT comments  OT treatment session with focus on bed mobility, functional transfers and self-care re-education. Patient progressed from bed to [facility] ending session seated in recliner. Patient continues to require set-up assist grossly for UB ADLs, Max A grossly for LB ADLs and Min guard to Min A for short-distance functional mobility with use of RW and would benefit from continued acute OT services in prep for safe d/c to next level of care. Patient hoping to progress enough for d/c home with family. OT will continue to follow acutely.    Recommendations for follow up therapy are one component of a multi-disciplinary discharge planning process, led by the attending physician.  Recommendations may be updated based on patient status, additional functional criteria and insurance authorization.    Follow Up Recommendations  Skilled nursing-short term rehab (<3 hours/day)    Assistance Recommended at Discharge Frequent or constant Supervision/Assistance  Equipment Recommendations  None recommended by OT    Recommendations for Other Services      Precautions / Restrictions Precautions Precautions: Fall;Knee;Other (comment) Precaution Comments: wound vac; foley cath Restrictions Weight Bearing Restrictions: Yes RLE Weight Bearing: Weight bearing as tolerated       Mobility Bed Mobility Overal bed mobility: Needs Assistance Bed Mobility: Supine to Sit     Supine to sit: HOB elevated;Supervision     General bed mobility  comments: Supervision A for safety/line management. No assist required for advancement of RLE this date. Completes task in reasonable amount of time.    Transfers Overall transfer level: Needs assistance Equipment used: Rolling walker (2 wheels) Transfers: Sit to/from Stand Sit to Stand: Min assist           General transfer comment: Min A for sit to stand from EOB and for sit to stand from low BSC. Increased time/effort required. Mild posterior LOB with with 1st attempt from [facility] requiring external assist for controlled descent.     Balance Overall balance assessment: Needs assistance Sitting-balance support: No upper extremity supported;Feet supported Sitting balance-Leahy Scale: Good     Standing balance support: Bilateral upper extremity supported;Reliant on assistive device for balance;During functional activity Standing balance-Leahy Scale: Poor                             ADL either performed or assessed with clinical judgement   ADL Overall ADL's : Needs assistance/impaired     Grooming: Set up;Sitting   Upper Body Bathing: Set up;Sitting   Lower Body Bathing: Maximal assistance;Cueing for safety;Cueing for sequencing;Sit to/from stand   Upper Body Dressing : Set up;Sitting   Lower Body Dressing: Maximal assistance;Cueing for safety;Cueing for sequencing;Sit to/from stand   Toilet Transfer: Min guard;Cueing for safety;Cueing for sequencing;Rolling walker (2 wheels)     Toileting - Clothing Manipulation Details (indicate cue type and reason): Unable to complete BM seated on bariatric BSC.            Extremity/Trunk Assessment  Vision       Perception     Praxis      Cognition Arousal/Alertness: Awake/alert Behavior During Therapy: WFL for tasks assessed/performed Overall Cognitive Status: Within Functional Limits for tasks assessed                                            Exercises     Shoulder  Instructions       General Comments Patient reports feeling discouraged by presence of blood in wound vac this date. Anticipating visit from MD.    Pertinent Vitals/ Pain       Pain Assessment: 0-10 Pain Intervention(s): Monitored during session  Home Living                                          Prior Functioning/Environment              Frequency  Min 2X/week        Progress Toward Goals  OT Goals(current goals can now be found in the care plan section)  Progress towards OT goals: Progressing toward goals  Acute Rehab OT Goals Patient Stated Goal: To progress mobility and return home. OT Goal Formulation: With patient/family Time For Goal Achievement: 01/21/21 Potential to Achieve Goals: Good ADL Goals Pt Will Perform Upper Body Bathing: Independently;sitting Pt Will Perform Lower Body Bathing: with modified independence;sit to/from stand Pt Will Perform Lower Body Dressing: with min assist;sit to/from stand Pt Will Transfer to Toilet: with supervision;ambulating;bedside commode Pt Will Perform Toileting - Clothing Manipulation and hygiene: with min guard assist;sit to/from stand Pt Will Perform Tub/Shower Transfer: with supervision;ambulating;rolling walker;grab bars;shower seat Additional ADL Goal #1: Patient will stand at sink to perform grooming task as evidence of improving activity tolerance  Plan Discharge plan remains appropriate;Frequency remains appropriate    Co-evaluation                 AM-PAC OT "6 Clicks" Daily Activity     Outcome Measure   Help from another person eating meals?: None Help from another person taking care of personal grooming?: A Little Help from another person toileting, which includes using toliet, bedpan, or urinal?: A Lot Help from another person bathing (including washing, rinsing, drying)?: A Lot Help from another person to put on and taking off regular upper body clothing?: A Little Help from  another person to put on and taking off regular lower body clothing?: A Lot 6 Click Score: 16    End of Session Equipment Utilized During Treatment: Rolling walker (2 wheels)  OT Visit Diagnosis: Unsteadiness on feet (R26.81);Other abnormalities of gait and mobility (R26.89);Pain Pain - Right/Left: Right Pain - part of body: Knee   Activity Tolerance Patient tolerated treatment well   Patient Left in chair;with call bell/phone within reach;with chair alarm set   Nurse Communication Mobility status        Time: 3833-3832 OT Time Calculation (min): 26 min  Charges: OT General Charges $OT Visit: 1 Visit OT Treatments $Self Care/Home Management : 23-37 mins  Ziyana Morikawa H. OTR/L Supplemental OT, Department of rehab services [phone]  Pina Sirianni R H. 01/09/2021, 11:03 AM

## 2021-01-09 NOTE — TOC Progression Note (Addendum)
Transition of Care [facility]) - Progression Note    Patient Details  Name: [name] MRN: [id number] Date of Birth: 1960/09/20  [facility] CM/SW [place], LCSW Phone Number: 01/09/2021, 9:34 AM  Clinical Narrative:    9:34am-Patient does not have any bed offers. CSW faxed referral to [facility] and requested [facility] review.   12pm-CSW met with patient and let her know that both [facility] and [facility] can accept her pending insurance auth (met OOP so should not have copays). Patient stated that OT told her she may be able to return home at discharge so she is not sure. CSW encouraged her to think about the options and CSW will check back in tomorrow for patient's decision. CSW notes ID is following; patient would not be accepted by SNF if IV Daptomycin is needed.   Expected Discharge Plan: [place] Barriers to Discharge: Continued Medical Work up  Expected Discharge Plan and Services Expected Discharge Plan: [place] In-house Referral: Clinical Social Work     Living arrangements for the past 2 months: Single Family Home                                       Social Determinants of Health (SDOH) Interventions    Readmission Risk Interventions No flowsheet data found.

## 2021-01-09 NOTE — Plan of Care (Signed)
  Problem: Education: Goal: Knowledge of General Education information will improve Description: Including pain rating scale, medication(s)/side effects and non-pharmacologic comfort measures Outcome: Progressing   Problem: Health Behavior/Discharge Planning: Goal: Ability to manage health-related needs will improve Outcome: Progressing   Problem: Clinical Measurements: Goal: Will remain free from infection Outcome: Progressing   

## 2021-01-09 NOTE — Progress Notes (Signed)
[place] for Infectious Disease  Date of Admission:  01/03/2021   Total days of inpatient antibiotics 7  Principal Problem:   Dyspnea Active Problems:   Paroxysmal atrial fibrillation (HCC)   Status post total right knee replacement   Tachypnea   Cellulitis of right knee   Chronic anticoagulation          Assessment: 60 YM  PMHx of morbid obesity, PAF, OSA, Hx of PE with recent total knee replacement on the right on 69/67/89 complicated by afib rvr. Pt was discharged home as she did not want to go to rehab. She presented due to worsening pain and admitted for PJI.   # MRSA Right Knee PJI SP I&D and poly-exchange -TTE showed no evidence of vegetations.  Recommendations: -Continue Vancomycin and ceftriaxone  -Follow OR Cx -Anticipate daptomycin on discharge for atleast 6 weeks followed by suppressive PO therapy fpr about 6 months -She is on Xarelto as such rifampin is contraindicated   Microbiology:   Antibiotics: Bactrim 11/29-12/1 Ceftriaxone 11/30, 12/3, 12/4 Cefepime 12/1, 12/2 Vancomycin 12/1-p  Cultures: OR Cx: 12/2 MRSA Urine 11/29 Urine Cx    SUBJECTIVE: Pt is resting bed. No new complaints. No significant overnight events.  Review of Systems: Review of Systems  All other systems reviewed and are negative.   Scheduled Meds:  Chlorhexidine Gluconate Cloth  6 each Topical Q0600   ferrous sulfate  325 mg Oral Q breakfast   fesoterodine  4 mg Oral Daily   folic acid  1 mg Oral Daily   metoprolol succinate  50 mg Oral Daily   multivitamin with minerals  1 tablet Oral Daily   pantoprazole  40 mg Oral Daily   polyethylene glycol  17 g Oral BID   rivaroxaban  20 mg Oral Q supper   saccharomyces boulardii  250 mg Oral BID   senna  1 tablet Oral BID   sodium chloride flush  10-40 mL Intracatheter Q12H   Continuous Infusions:  cefTRIAXone (ROCEPHIN)  IV 2 g (01/08/21 1627)   vancomycin 2,000 mg (01/08/21 1841)   PRN Meds:.acetaminophen  **OR** acetaminophen, albuterol, HYDROmorphone (DILAUDID) injection, methocarbamol, oxyCODONE, senna-docusate, sodium chloride flush Allergies  Allergen Reactions   Contrast Media [Iodinated Diagnostic Agents] Shortness Of Breath and Other (See Comments)    APNEA   Penicillins Itching and Rash    PATIENT HAD A PCN REACTION WITH IMMEDIATE RASH, FACIAL/TONGUE/THROAT SWELLING, SOB, OR LIGHTHEADEDNESS WITH HYPOTENSION:  #  #  #  YES  #  #  #   Has patient had a PCN reaction causing severe rash involving mucus membranes or skin necrosis: NO Has patient had a PCN reaction that required hospitalization NO Has patient had a PCN reaction occurring within the last 10 years: NO   Ancef [Cefazolin Sodium] Nausea And Vomiting    Tolerates IV    OBJECTIVE: Vitals:   01/08/21 2026 01/09/21 0506 01/09/21 0905 01/09/21 1101  BP: (!) 123/50 122/63 124/60 (!) 124/57  Pulse: 77 75 74 75  Resp: 17 19 20    Temp: 99.7 F (37.6 C) 98.6 F (37 C) 98.6 F (37 C)   TempSrc: Oral Oral Oral   SpO2: 97% 97% 98%    There is no height or weight on file to calculate BMI.  Physical Exam Constitutional:      Appearance: Normal appearance.  HENT:     Head: Normocephalic and atraumatic.     Right Ear: Tympanic membrane normal.  Left Ear: Tympanic membrane normal.     Nose: Nose normal.     Mouth/Throat:     Mouth: Mucous membranes are moist.  Eyes:     Extraocular Movements: Extraocular movements intact.     Conjunctiva/sclera: Conjunctivae normal.     Pupils: Pupils are equal, round, and reactive to light.  Cardiovascular:     Rate and Rhythm: Normal rate and regular rhythm.     Heart sounds: No murmur heard.   No friction rub. No gallop.  Pulmonary:     Effort: Pulmonary effort is normal.     Breath sounds: Normal breath sounds.  Abdominal:     General: Abdomen is flat.     Palpations: Abdomen is soft.  Musculoskeletal:        General: Normal range of motion.     Comments: Right knee wound  vac Left knee TKA site  Skin:    General: Skin is warm and dry.  Neurological:     General: No focal deficit present.     Mental Status: She is alert and oriented to person, place, and time.  Psychiatric:        Mood and Affect: Mood normal.      Lab Results Lab Results  Component Value Date   WBC 9.0 01/08/2021   HGB 8.2 (L) 01/08/2021   HCT 27.9 (L) 01/08/2021   MCV 81.3 01/08/2021   PLT 342 01/08/2021    Lab Results  Component Value Date   CREATININE 0.55 01/08/2021   BUN 10 01/08/2021   NA 138 01/08/2021   K 4.1 01/08/2021   CL 107 01/08/2021   CO2 26 01/08/2021    Lab Results  Component Value Date   ALT 20 01/03/2021   AST 30 01/03/2021   ALKPHOS 55 01/03/2021   BILITOT 0.7 01/03/2021        Laurice Record, [place] for Infectious [facility] 01/09/2021, 1:00 PM

## 2021-01-09 NOTE — Progress Notes (Signed)
Patient ID: [name], female   DOB: 16-May-1960, 60 y.o.   MRN: [id number] The incisional VAC is putting off some drainage which is good.  I plan to leave that on for at least the next 1 to 2 days to allow it to hopefully dry on the incision more as she is becoming a little more mobile.  The Gram stain does show staph aureus but the susceptibilities are pending.  Therapy has worked with her.  She is significantly fatigued.  She will likely need short-term skilled nursing placement following this hospitalization.  She does have her PICC line.  A CBC is pending for tomorrow morning.  Given the amount of fatigue she has had, I will have a low threshold for giving her at least a unit of blood.  I appreciate the hospitalist good care of the patient as well and assisting in the care of her.

## 2021-01-10 ENCOUNTER — Encounter (HOSPITAL_COMMUNITY): Payer: Self-pay | Admitting: Orthopaedic Surgery

## 2021-01-10 DIAGNOSIS — R0609 Other forms of dyspnea: Secondary | ICD-10-CM | POA: Diagnosis not present

## 2021-01-10 LAB — BASIC METABOLIC PANEL
Anion gap: 7 (ref 5–15)
BUN: 6 mg/dL — ABNORMAL LOW (ref 8–23)
CO2: 26 mmol/L (ref 22–32)
Calcium: 8.2 mg/dL — ABNORMAL LOW (ref 8.9–10.3)
Chloride: 104 mmol/L (ref 98–111)
Creatinine, Ser: 0.48 mg/dL (ref 0.44–1.00)
GFR, Estimated: >60 mL/min (ref >60)
Glucose, Bld: 91 mg/dL (ref 70–99)
Potassium: 3.8 mmol/L (ref 3.5–5.1)
Sodium: 137 mmol/L (ref 135–145)

## 2021-01-10 LAB — CBC
HCT: 27.2 % — ABNORMAL LOW (ref 36.0–46.0)
Hemoglobin: 7.8 g/dL — ABNORMAL LOW (ref 12.0–15.0)
MCH: 23.9 pg — ABNORMAL LOW (ref 26.0–34.0)
MCHC: 28.7 g/dL — ABNORMAL LOW (ref 30.0–36.0)
MCV: 83.2 fL (ref 80.0–100.0)
Platelets: 302 10*3/uL (ref 150–400)
RBC: 3.27 MIL/uL — ABNORMAL LOW (ref 3.87–5.11)
RDW: 19 % — ABNORMAL HIGH (ref 11.5–15.5)
WBC: 6.6 10*3/uL (ref 4.0–10.5)
nRBC: 0.3 % — ABNORMAL HIGH (ref 0.0–0.2)

## 2021-01-10 LAB — PREPARE RBC (CROSSMATCH)

## 2021-01-10 LAB — CK: Total CK: 54 U/L (ref 38–234)

## 2021-01-10 MED ORDER — SODIUM CHLORIDE 0.9% IV SOLUTION: Freq: Once | INTRAVENOUS | Status: AC

## 2021-01-10 NOTE — TOC Progression Note (Signed)
Transition of Care [facility]) - Progression Note    Patient Details  Name: [name] MRN: [id number] Date of Birth: Nov 08, 1960  Transition of Care [facility]) CM/SW [place], LCSW Phone Number: 01/10/2021, 10:16 AM  Clinical Narrative:    CSW received consult to see if IV Daptomycin would be covered at home by patient's insurance. CSW reached out to [facility] with [employer] to see if she can check the cost. Of note, if patient chooses SNF, they likely will not accept patient on Daptomycin but CSW will follow up.    Expected Discharge Plan: [place] Barriers to Discharge: Continued Medical Work up  Expected Discharge Plan and Services Expected Discharge Plan: [place] In-house Referral: Clinical Social Work     Living arrangements for the past 2 months: Single Family Home                                       Social Determinants of Health (SDOH) Interventions    Readmission Risk Interventions No flowsheet data found.

## 2021-01-10 NOTE — Progress Notes (Signed)
PROGRESS NOTE    [name]  JOI:[id number] DOB: 08-10-1960 DOA: 01/03/2021 PCP: Mateo Flow, MD   Brief Narrative: 60 year old with past medical history significant for morbid obesity, PAF, OSA, history of PE with recent total knee replacement on the right.  She underwent right total knee arthroplasty in December 24, 4707 complicated by A. fib RVR requiring prolonged hospitalization.  She has had significant pain and difficulty with mobility but did not want to go to rehab and was discharged home.  She presented due to worsening shortness of breath on exertion.  She also had episode of orthostatic hypotension at home.  He has been taking Xarelto. She has been evaluated by orthopedic, who is recommending IV antibiotics for mild knee cellulitis. Underwent a CT angio chest which was negative for PE, Doppler lower extremity negative for DVT.  Patient was found to have prostatic knee infection, with MRSA.  She underwent  irrigation and debridement of right knee and Poly exchange on 12/02.  ID was consulted patient will be discharged on daptomycin for 6 weeks, subsequently she will require 6 months of oral antibiotics. Hb  has decreased to 7.8.  She will receive 2 units of packed red blood cell.    Assessment & Plan:   Principal Problem:   Dyspnea Active Problems:   Paroxysmal atrial fibrillation (HCC)   Status post total right knee replacement   Tachypnea   Cellulitis of right knee   Chronic anticoagulation   1-s/p total Right  knee replacement,  superimposed cellulitis right knee drainage, Infection status post TKA. -Underwent irrigation and debridement of right knee 12/02 due to bleeding and concern for infection.  -Culture growing: MRSA.  -Treated initially  with IV vancomycin and ceftriaxone.  -Ortho recommending prolonged course of IV antibiotics.   -She will need 6 weeks of IV antibiotics follow by  6 month oral treatment.  -Culture growing MRSA,ID recommend Daptomycin   for 6 weeks.  -Discharge in 1 or 2 days when ok by Ortho.   2-Dyspnea on exertion, Tachypnea: CTA negative for PE.  She was premedicated due to allergy to contrast. Continue with nebulizer.  Doppler lower extremity negative for DVT Echo Normal EF, LVF.  EKG: Sinus rhythm.Troponin negative. Benefit from rehab Suspect deconditioning. Oxygen sat normal on ambulation with OT>  Hb down to 7.8 plan to proceed with 2 units PRBC>   3-Paroxysmal A. fib: Continue with metoprolol.  EKG sinus rhythm. Holding xarelto due to Right  knee hematoma.  Continue with Xarelto  4-Chronic anticoagulation; Back on xarelto.   5-Anemia; monitor. Started ferrous sulfate.  Hd down to 8.9--8.2.--7.8 Plan to transfuse 2 unit PRBC> 12/06  6-Constipation; started on Miralax, and senna.   Estimated body mass index is 49.28 kg/m as calculated from the following:   Height as of 12/23/20: 5\' 6"  (1.676 m).   Weight as of this encounter: 138.5 kg.   DVT prophylaxis: SCD Code Status: Full code Family Communication: care discussed with patient.  Disposition Plan:  Status is: Inpatient  Remains inpatient appropriate because: Patient admitted with dyspnea on exertion, recent total knee replacement. Awaiting SNF       Consultants:  Dr. Ninfa Linden Procedures:  ECHO  Antimicrobials:    Subjective: No BM yet.  She is concern with infection in her knee.   Objective: Vitals:   01/10/21 1055 01/10/21 1324 01/10/21 1325 01/10/21 1359  BP: 114/61 129/60 129/60 (!) 122/57  Pulse: 74 80 80 78  Resp: 18 19 18 18   Temp:  98.4 F (36.9 C) 98.2 F (36.8 C) 98.2 F (36.8 C) 98.7 F (37.1 C)  TempSrc: Oral Oral Oral Oral  SpO2: 94%  94% 93%  Weight:        Intake/Output Summary (Last 24 hours) at 01/10/2021 1452 Last data filed at 01/10/2021 1324 Gross per 24 hour  Intake 1333 ml  Output 1400 ml  Net -67 ml    Filed Weights   01/09/21 1101 01/09/21 1616  Weight: (!) 146 kg (!) 138.5 kg     Examination:  General exam: NAD Respiratory system: CTA Cardiovascular system: S 1, S 2 RRR Gastrointestinal system: BS present, soft nt Central nervous system; Alert Extremities: Right knee with clean dressing, wound vac  Data Reviewed: I have personally reviewed following labs and imaging studies  CBC: Recent Labs  Lab 01/06/21 0734 01/07/21 0322 01/08/21 0313 01/09/21 1517 01/10/21 0410  WBC 6.7 11.4* 9.0 7.6 6.6  HGB 9.8* 8.9* 8.2* 8.6* 7.8*  HCT 32.8* 30.1* 27.9* 28.7* 27.2*  MCV 79.6* 80.3 81.3 82.5 83.2  PLT 349 395 342 320 867    Basic Metabolic Panel: Recent Labs  Lab 01/04/21 0339 01/06/21 0734 01/08/21 0313 01/09/21 1517 01/10/21 0410  NA 135 137 138 134* 137  K 3.9 4.3 4.1 3.8 3.8  CL 104 107 107 105 104  CO2 24 23 26 24 26   GLUCOSE 113* 72 83 106* 91  BUN 6* 11 10 9  6*  CREATININE 0.60 0.69 0.55 0.48 0.48  CALCIUM 8.3* 8.2* 8.0* 8.2* 8.2*    GFR: Estimated Creatinine Clearance: 106.1 mL/min (by C-G formula based on SCr of 0.48 mg/dL). Liver Function Tests: No results for input(s): AST, ALT, ALKPHOS, BILITOT, PROT, ALBUMIN in the last 168 hours.  No results for input(s): LIPASE, AMYLASE in the last 168 hours. No results for input(s): AMMONIA in the last 168 hours. Coagulation Profile: No results for input(s): INR, PROTIME in the last 168 hours. Cardiac Enzymes: Recent Labs  Lab 01/03/21 1638 01/10/21 0410  CKTOTAL 142 54    BNP (last 3 results) No results for input(s): PROBNP in the last 8760 hours. HbA1C: No results for input(s): HGBA1C in the last 72 hours. CBG: No results for input(s): GLUCAP in the last 168 hours. Lipid Profile: No results for input(s): CHOL, HDL, LDLCALC, TRIG, CHOLHDL, LDLDIRECT in the last 72 hours. Thyroid Function Tests: No results for input(s): TSH, T4TOTAL, FREET4, T3FREE, THYROIDAB in the last 72 hours. Anemia Panel: No results for input(s): VITAMINB12, FOLATE, FERRITIN, TIBC, IRON, RETICCTPCT in  the last 72 hours. Sepsis Labs: No results for input(s): PROCALCITON, LATICACIDVEN in the last 168 hours.  Recent Results (from the past 240 hour(s))  Resp Panel by RT-PCR (Flu A&B, Covid) Nasopharyngeal Swab     Status: None   Collection Time: 01/03/21  4:10 PM   Specimen: Nasopharyngeal Swab; Nasopharyngeal(NP) swabs in vial transport medium  Result Value Ref Range Status   SARS Coronavirus 2 by RT PCR NEGATIVE NEGATIVE Final    Comment: (NOTE) SARS-CoV-2 target nucleic acids are NOT DETECTED.  The SARS-CoV-2 RNA is generally detectable in upper respiratory specimens during the acute phase of infection. The lowest concentration of SARS-CoV-2 viral copies this assay can detect is 138 copies/mL. A negative result does not preclude SARS-Cov-2 infection and should not be used as the sole basis for treatment or other patient management decisions. A negative result may occur with  improper specimen collection/handling, submission of specimen other than nasopharyngeal swab, presence of viral mutation(s) within  the areas targeted by this assay, and inadequate number of viral copies(<138 copies/mL). A negative result must be combined with clinical observations, patient history, and epidemiological information. The expected result is Negative.  Fact Sheet for Patients:  EntrepreneurPulse.com.au  Fact Sheet for Healthcare Providers:  IncredibleEmployment.be  This test is no t yet approved or cleared by the [place] FDA and  has been authorized for detection and/or diagnosis of SARS-CoV-2 by FDA under an Emergency Use Authorization (EUA). This EUA will remain  in effect (meaning this test can be used) for the duration of the COVID-19 declaration under Section 564(b)(1) of the Act, 21 U.S.C.section 360bbb-3(b)(1), unless the authorization is terminated  or revoked sooner.       Influenza A by PCR NEGATIVE NEGATIVE Final   Influenza B by PCR  NEGATIVE NEGATIVE Final    Comment: (NOTE) The Xpert Xpress SARS-CoV-2/FLU/RSV plus assay is intended as an aid in the diagnosis of influenza from Nasopharyngeal swab specimens and should not be used as a sole basis for treatment. Nasal washings and aspirates are unacceptable for Xpert Xpress SARS-CoV-2/FLU/RSV testing.  Fact Sheet for Patients: EntrepreneurPulse.com.au  Fact Sheet for Healthcare Providers: IncredibleEmployment.be  This test is not yet approved or cleared by the [place] FDA and has been authorized for detection and/or diagnosis of SARS-CoV-2 by FDA under an Emergency Use Authorization (EUA). This EUA will remain in effect (meaning this test can be used) for the duration of the COVID-19 declaration under Section 564(b)(1) of the Act, 21 U.S.C. section 360bbb-3(b)(1), unless the authorization is terminated or revoked.  Performed at [facility], [place] [address] [place], [place] [address]   Urine Culture     Status: Abnormal   Collection Time: 01/03/21  6:19 PM   Specimen: Urine, Clean Catch  Result Value Ref Range Status   Specimen Description URINE, CLEAN CATCH  Final   Special Requests   Final    NONE Performed at [facility], [place] [address] [place], [place] [address]    Culture MULTIPLE SPECIES PRESENT, SUGGEST RECOLLECTION (A)  Final   Report Status 01/04/2021 FINAL  Final  Aerobic/Anaerobic Culture w Gram Stain (surgical/deep wound)     Status: None (Preliminary result)   Collection Time: 01/06/21  4:35 PM   Specimen: PATH Cytology Misc. fluid; Body Fluid  Result Value Ref Range Status   Specimen Description FLUID RIGHT KNEE  Final   Special Requests SAMPLE A  Final   Gram Stain   Final    FEW WBC PRESENT, PREDOMINANTLY MONONUCLEAR RARE GRAM POSITIVE COCCI Performed at [facility], [place] [address] [place], [place] [address]    Culture   Final    FEW STAPHYLOCOCCUS AUREUS SUSCEPTIBILITIES  PERFORMED ON PREVIOUS CULTURE WITHIN THE LAST 5 DAYS. NO ANAEROBES ISOLATED; CULTURE IN PROGRESS FOR 5 DAYS    Report Status PENDING  Incomplete  Aerobic/Anaerobic Culture w Gram Stain (surgical/deep wound)     Status: None (Preliminary result)   Collection Time: 01/06/21  4:38 PM   Specimen: PATH Cytology Misc. fluid; Body Fluid  Result Value Ref Range Status   Specimen Description JOINT FLUID RIGHT KNEE  Final   Special Requests SAMPLE A  Final   Gram Stain   Final    MODERATE WBC PRESENT, PREDOMINANTLY MONONUCLEAR FEW GRAM POSITIVE COCCI Performed at [facility], [place] [address], [place], [place] [address]    Culture   Final    MODERATE METHICILLIN RESISTANT STAPHYLOCOCCUS AUREUS  NO ANAEROBES ISOLATED; CULTURE IN PROGRESS FOR 5 DAYS    Report Status PENDING  Incomplete   Organism ID, Bacteria METHICILLIN RESISTANT STAPHYLOCOCCUS AUREUS  Final      Susceptibility   Methicillin resistant staphylococcus aureus - MIC*    CIPROFLOXACIN >=8 RESISTANT Resistant     ERYTHROMYCIN >=8 RESISTANT Resistant     GENTAMICIN <=0.5 SENSITIVE Sensitive     OXACILLIN >=4 RESISTANT Resistant     TETRACYCLINE >=16 RESISTANT Resistant     VANCOMYCIN <=0.5 SENSITIVE Sensitive     TRIMETH/SULFA <=10 SENSITIVE Sensitive     CLINDAMYCIN >=8 RESISTANT Resistant     RIFAMPIN <=0.5 SENSITIVE Sensitive     Inducible Clindamycin NEGATIVE Sensitive     * MODERATE METHICILLIN RESISTANT STAPHYLOCOCCUS AUREUS          Radiology Studies: No results found.      Scheduled Meds:  Chlorhexidine Gluconate Cloth  6 each Topical Q0600   ferrous sulfate  325 mg Oral Q breakfast   fesoterodine  4 mg Oral Daily   folic acid  1 mg Oral Daily   metoprolol succinate  50 mg Oral Daily   multivitamin with minerals  1 tablet Oral Daily   pantoprazole  40 mg Oral Daily   polyethylene glycol  17 g Oral BID   rivaroxaban  20 mg Oral Q supper   saccharomyces boulardii  250 mg Oral BID   senna  1 tablet  Oral BID   sodium chloride flush  10-40 mL Intracatheter Q12H   Continuous Infusions:  DAPTOmycin (CUBICIN)  IV 750 mg (01/09/21 2113)      LOS: 7 days    Time spent: 35 minutes    Nazim Kadlec A Kamariyah Timberlake, MD [facility]   If 7PM-7AM, please contact night-coverage [email or web address]  01/10/2021, 2:52 PM

## 2021-01-10 NOTE — Plan of Care (Signed)

## 2021-01-10 NOTE — Progress Notes (Signed)
Patient ID: [name], female   DOB: 1960/06/17, 60 y.o.   MRN: [id number] [name] is awake and alert this morning.  She is fatigued.  Her hemoglobin was down below 8 so a transfusion has been ordered.  I think this will certainly help her feel better.  Her cultures from her right total knee infection did grow out MRSA.  She understands that this can be more difficult to eradicate.  However, she was only at 2 weeks postop from her initial knee replacement and we performed an aggressive arthrotomy with irrigation and debridement as well as polyliner exchange and placement of vancomycin antibiotic powder.  This gives a better chance hopefully of success with treating an infection is very difficult to treat.  Her white blood cell count remains normal.  Her CRP and sed rate few days ago were elevated but not at extreme amounts.  We will still allow her to weight-bear as tolerated.  The incisional VAC has some drainage from her right knee incision which is to be expected.  She likely need short-term skilled nursing placement to continue to rehabilitate her knee.  I will leave the incisional VAC on for at least another 1 to 2 days to optimize its effect with keeping the knee dry.  She had appropriate questions about an excision arthroplasty with placement of antibiotic spacer.  If this was more of a chronic infection or months out from the original surgery, that would be a more reasonable surgery to undertake.  Right now, I do not believe excision of the components is warranted and I really need for soft tissue to heal before considering any other type of surgery that could make the arthrotomy closure even more difficult.  I think she would have a very difficult time with an excision arthroplasty and antibiotic spacer because we cannot allow full weightbearing given her morbid obesity and that would certainly lead to other issues.

## 2021-01-10 NOTE — Progress Notes (Signed)
Physical Therapy Treatment Patient Details Name: [name] MRN: [id number] DOB: [birth year] Today's Date: 01/10/2021   History of Present Illness Pt is 60 y.o. female presents to [facility] hospital on 01/03/2021 with reports of SOB and DOE. Pt underwent R TKA 40/98, complicated by Afib with RVR. Pt admitted for management of Afib and RLE cellulitis.  Pt found to have MRSA infection and to OR 12/2 for R knee I&D, polyliner exchange, and wound vac placement. JXB:JYNWGN obesity, PAF, OA, hx of PE, anemia, CHF    PT Comments    Pt making gradual progress.  She is able to complete transfers with supervision-min guard level.  Only ambulated 8' , limited by dizziness but was first time OOB today and just received 2 units of PRBC.  Pt expressing that she is not happy with her SNF options and considering home at d/c.  PT and pt discussed.  Pt with DME including w/c for longer distances and ramp to enter home.  She has supportive spouse who is able to physically assist if needed.  Pt will be getting IV antibiotics and has wound vac - but will have [place] and is an RN herself so familiar with devices.  Feel that pt has resources and mobility to progress to home level if can increase gait ability.     Recommendations for follow up therapy are one component of a multi-disciplinary discharge planning process, led by the attending physician.  Recommendations may be updated based on patient status, additional functional criteria and insurance authorization.  Follow Up Recommendations  Other (comment) (Potential to progress to home with HHPT vs SNF)     Assistance Recommended at Discharge Frequent or constant Supervision/Assistance  Equipment Recommendations  None recommended by PT    Recommendations for Other Services       Precautions / Restrictions Precautions Precautions: Fall;Knee Restrictions RLE Weight Bearing: Weight bearing as tolerated     Mobility  Bed Mobility Overal bed mobility: Needs  Assistance Bed Mobility: Supine to Sit     Supine to sit: HOB elevated;Supervision     General bed mobility comments: Supervision A for safety/line management. No assist required for advancement of RLE this date.    Transfers Overall transfer level: Needs assistance Equipment used: Rolling walker (2 wheels) Transfers: Sit to/from Stand Sit to Stand: Min guard           General transfer comment: Performed from bed *(only slightly elevated) and BSC(low) with min guard for safety    Ambulation/Gait Ambulation/Gait assistance: Min guard Gait Distance (Feet): 8 Feet Assistive device: Rolling walker (2 wheels) Gait Pattern/deviations: Step-to pattern;Decreased stride length;Decreased weight shift to right Gait velocity: decreased     General Gait Details: Limited distance due to reports of lightheadedness (of note first time up today) - resolved with sitting.  Min cues for safey and RW   Stairs             Wheelchair Mobility    Modified Rankin (Stroke Patients Only)       Balance Overall balance assessment: Needs assistance Sitting-balance support: No upper extremity supported;Feet supported Sitting balance-Leahy Scale: Good     Standing balance support: Bilateral upper extremity supported;Reliant on assistive device for balance;During functional activity Standing balance-Leahy Scale: Poor Standing balance comment: Requiring RW                            Cognition Arousal/Alertness: Awake/alert Behavior During Therapy: [facility] for tasks assessed/performed  Overall Cognitive Status: Within Functional Limits for tasks assessed                                          Exercises Total Joint Exercises Ankle Circles/Pumps: AROM;Both;20 reps;Supine Quad Sets: AROM;Right;10 reps;Supine Long Arc Quad: AROM;Right;10 reps;Seated Knee Flexion: AROM;Right;10 reps;Seated Goniometric ROM: R knee 5 to 70 degrees General Exercises - Lower  Extremity Hip Flexion/Marching: AROM;Both;10 reps;Seated Other Exercises Other Exercises: min cues for controlled motion    General Comments General comments (skin integrity, edema, etc.): Pt reports struggling with decision of going to SNF.  States her insurance only approved SNF with 1 star ratings and would have to pay out of pocket for better palce.  States at home she will have a ramp (coming in tomorrow), support from spouse who can physically assist, and has w/c for longer distance that will fit through doors except bathroom and she has a BSC.  Pt also reporting that she feels that she would be up and moving more at home than SNF - PT agrees. Pt with wound vac and IV antibiotics , but discussed would ahve HH and she is a Therapist, sports so familiar with devices.  PT discussed with pt - considering these factors feel that may be able to progress to home level.       Pertinent Vitals/Pain Pain Assessment: 0-10 Pain Score: 7  Pain Location: R knee Pain Descriptors / Indicators: Discomfort;Sore Pain Intervention(s): Limited activity within patient's tolerance;Monitored during session;Repositioned    Home Living                          Prior Function            PT Goals (current goals can now be found in the care plan section) Progress towards PT goals: Progressing toward goals    Frequency    Min 4X/week      PT Plan Discharge plan needs to be updated    Co-evaluation              AM-PAC PT "6 Clicks" Mobility   Outcome Measure  Help needed turning from your back to your side while in a flat bed without using bedrails?: A Little Help needed moving from lying on your back to sitting on the side of a flat bed without using bedrails?: A Little Help needed moving to and from a bed to a chair (including a wheelchair)?: A Little Help needed standing up from a chair using your arms (e.g., wheelchair or bedside chair)?: A Little Help needed to walk in hospital room?: A  Little Help needed climbing 3-5 steps with a railing? : Total 6 Click Score: 16    End of Session Equipment Utilized During Treatment: Gait belt Activity Tolerance: Patient tolerated treatment well Patient left: in chair;with call bell/phone within reach;with family/visitor present Nurse Communication: Mobility status PT Visit Diagnosis: Other abnormalities of gait and mobility (R26.89);Pain Pain - Right/Left: Right Pain - part of body: Knee     Time: 1500-1534 PT Time Calculation (min) (ACUTE ONLY): 34 min  Charges:  $Gait Training: 8-22 mins $Therapeutic Exercise: 8-22 mins                     Abran Richard, PT Acute Rehab Services Pager [phone] [name] [phone]    Karlton Lemon 01/10/2021, 5:53 PM

## 2021-01-10 NOTE — Progress Notes (Signed)
Pharmacy Antibiotic Note- Follow-up  [name] is a 60 y.o. female admitted on 01/03/2021 and has a prosthetic knee infection with MRSA.  She is s/p I&D with polyexchange on 12/3.  ID has changed her antibiotics to daptomycin.  CK today is normal at 54.  Renal function is stable with Scr 0.55.  Plan: Stop vancomycin and ceftriaxone Daptomycin 750mg  IV q24h (utilized adjusted body weight of 91kg given her BMI of 49) Weekly CK   Weight: (!) 138.5 kg (305 lb 4.8 oz)  Temp (24hrs), Avg:98.6 F (37 C), Min:98.3 F (36.8 C), Max:99 F (37.2 C)  Recent Labs  Lab 01/04/21 0339 01/05/21 0907 01/06/21 0734 01/07/21 0322 01/08/21 0313 01/09/21 1517 01/10/21 0410  WBC 8.0   < > 6.7 11.4* 9.0 7.6 6.6  CREATININE 0.60  --  0.69  --  0.55 0.48 0.48   < > = values in this interval not displayed.     Estimated Creatinine Clearance: 106.1 mL/min (by C-G formula based on SCr of 0.48 mg/dL).     Antimicrobials this admission: Bactrim 11/29 >> 12/01 Vanc 12/1 >>12/5 Cefepime 12/1 >>12/3 Ceftriaxone 12/3>>12/5 Daptomycin 12/5>>  Dose adjustments this admission:   Microbiology results: Recent Results (from the past 240 hour(s))  Resp Panel by RT-PCR (Flu A&B, Covid) Nasopharyngeal Swab     Status: None   Collection Time: 01/03/21  4:10 PM   Specimen: Nasopharyngeal Swab; Nasopharyngeal(NP) swabs in vial transport medium  Result Value Ref Range Status   SARS Coronavirus 2 by RT PCR NEGATIVE NEGATIVE Final    Comment: (NOTE) SARS-CoV-2 target nucleic acids are NOT DETECTED.  The SARS-CoV-2 RNA is generally detectable in upper respiratory specimens during the acute phase of infection. The lowest concentration of SARS-CoV-2 viral copies this assay can detect is 138 copies/mL. A negative result does not preclude SARS-Cov-2 infection and should not be used as the sole basis for treatment or other patient management decisions. A negative result may occur with  improper specimen  collection/handling, submission of specimen other than nasopharyngeal swab, presence of viral mutation(s) within the areas targeted by this assay, and inadequate number of viral copies(<138 copies/mL). A negative result must be combined with clinical observations, patient history, and epidemiological information. The expected result is Negative.  Fact Sheet for Patients:  EntrepreneurPulse.com.au  Fact Sheet for Healthcare Providers:  IncredibleEmployment.be  This test is no t yet approved or cleared by the [place] FDA and  has been authorized for detection and/or diagnosis of SARS-CoV-2 by FDA under an Emergency Use Authorization (EUA). This EUA will remain  in effect (meaning this test can be used) for the duration of the COVID-19 declaration under Section 564(b)(1) of the Act, 21 U.S.C.section 360bbb-3(b)(1), unless the authorization is terminated  or revoked sooner.       Influenza A by PCR NEGATIVE NEGATIVE Final   Influenza B by PCR NEGATIVE NEGATIVE Final    Comment: (NOTE) The Xpert Xpress SARS-CoV-2/FLU/RSV plus assay is intended as an aid in the diagnosis of influenza from Nasopharyngeal swab specimens and should not be used as a sole basis for treatment. Nasal washings and aspirates are unacceptable for Xpert Xpress SARS-CoV-2/FLU/RSV testing.  Fact Sheet for Patients: EntrepreneurPulse.com.au  Fact Sheet for Healthcare Providers: IncredibleEmployment.be  This test is not yet approved or cleared by the [place] FDA and has been authorized for detection and/or diagnosis of SARS-CoV-2 by FDA under an Emergency Use Authorization (EUA). This EUA will remain in effect (meaning this test can be  used) for the duration of the COVID-19 declaration under Section 564(b)(1) of the Act, 21 U.S.C. section 360bbb-3(b)(1), unless the authorization is terminated or revoked.  Performed at [facility], [place] [address], [place], [place] [address]   Urine Culture     Status: Abnormal   Collection Time: 01/03/21  6:19 PM   Specimen: Urine, Clean Catch  Result Value Ref Range Status   Specimen Description URINE, CLEAN CATCH  Final   Special Requests   Final    NONE Performed at [facility], [place] [address], [place], [place] [address]    Culture MULTIPLE SPECIES PRESENT, SUGGEST RECOLLECTION (A)  Final   Report Status 01/04/2021 FINAL  Final  Aerobic/Anaerobic Culture w Gram Stain (surgical/deep wound)     Status: None (Preliminary result)   Collection Time: 01/06/21  4:35 PM   Specimen: PATH Cytology Misc. fluid; Body Fluid  Result Value Ref Range Status   Specimen Description FLUID RIGHT KNEE  Final   Special Requests SAMPLE A  Final   Gram Stain   Final    FEW WBC PRESENT, PREDOMINANTLY MONONUCLEAR RARE GRAM POSITIVE COCCI Performed at [facility], [place] [address] [place], [place] [address]    Culture   Final    FEW STAPHYLOCOCCUS AUREUS SUSCEPTIBILITIES PERFORMED ON PREVIOUS CULTURE WITHIN THE LAST 5 DAYS. NO ANAEROBES ISOLATED; CULTURE IN PROGRESS FOR 5 DAYS    Report Status PENDING  Incomplete  Aerobic/Anaerobic Culture w Gram Stain (surgical/deep wound)     Status: None (Preliminary result)   Collection Time: 01/06/21  4:38 PM   Specimen: PATH Cytology Misc. fluid; Body Fluid  Result Value Ref Range Status   Specimen Description JOINT FLUID RIGHT KNEE  Final   Special Requests SAMPLE A  Final   Gram Stain   Final    MODERATE WBC PRESENT, PREDOMINANTLY MONONUCLEAR FEW GRAM POSITIVE COCCI Performed at [facility], [place] [address], [place], [place] [address]    Culture   Final    MODERATE METHICILLIN RESISTANT STAPHYLOCOCCUS AUREUS NO ANAEROBES ISOLATED; CULTURE IN PROGRESS FOR 5 DAYS    Report Status PENDING  Incomplete   Organism ID, Bacteria METHICILLIN RESISTANT STAPHYLOCOCCUS AUREUS  Final      Susceptibility   Methicillin  resistant staphylococcus aureus - MIC*    CIPROFLOXACIN >=8 RESISTANT Resistant     ERYTHROMYCIN >=8 RESISTANT Resistant     GENTAMICIN <=0.5 SENSITIVE Sensitive     OXACILLIN >=4 RESISTANT Resistant     TETRACYCLINE >=16 RESISTANT Resistant     VANCOMYCIN <=0.5 SENSITIVE Sensitive     TRIMETH/SULFA <=10 SENSITIVE Sensitive     CLINDAMYCIN >=8 RESISTANT Resistant     RIFAMPIN <=0.5 SENSITIVE Sensitive     Inducible Clindamycin NEGATIVE Sensitive     * MODERATE METHICILLIN RESISTANT STAPHYLOCOCCUS AUREUS     Thank you for allowing pharmacy to be a part of this patient's care.  Heide Guile, PharmD, BCPS-AQ ID Clinical Pharmacist Phone [phone]  01/10/2021 10:40 AM

## 2021-01-11 DIAGNOSIS — R0609 Other forms of dyspnea: Secondary | ICD-10-CM | POA: Diagnosis not present

## 2021-01-11 LAB — TYPE AND SCREEN
ABO/RH(D): O POS
Antibody Screen: NEGATIVE
Unit division: 0
Unit division: 0

## 2021-01-11 LAB — CBC
HCT: 32.4 % — ABNORMAL LOW (ref 36.0–46.0)
Hemoglobin: 9.7 g/dL — ABNORMAL LOW (ref 12.0–15.0)
MCH: 24.7 pg — ABNORMAL LOW (ref 26.0–34.0)
MCHC: 29.9 g/dL — ABNORMAL LOW (ref 30.0–36.0)
MCV: 82.7 fL (ref 80.0–100.0)
Platelets: 296 10*3/uL (ref 150–400)
RBC: 3.92 MIL/uL (ref 3.87–5.11)
RDW: 18.3 % — ABNORMAL HIGH (ref 11.5–15.5)
WBC: 8.2 10*3/uL (ref 4.0–10.5)
nRBC: 0 % (ref 0.0–0.2)

## 2021-01-11 LAB — BPAM RBC
Blood Product Expiration Date: 202212292359
Blood Product Expiration Date: 202301012359
ISSUE DATE / TIME: 202212061034
ISSUE DATE / TIME: 202212061334
Unit Type and Rh: 5100
Unit Type and Rh: 5100

## 2021-01-11 LAB — SEDIMENTATION RATE: Sed Rate: 47 mm/hr — ABNORMAL HIGH (ref 0–22)

## 2021-01-11 LAB — C-REACTIVE PROTEIN: CRP: 5.5 mg/dL — ABNORMAL HIGH (ref <1.0)

## 2021-01-11 NOTE — Progress Notes (Signed)
PROGRESS NOTE    [name]  XKG:[id number] DOB: [birth year] DOA: 01/03/2021 PCP: Mateo Flow, MD   Brief Narrative: 60 year old with past medical history significant for morbid obesity, PAF, OSA, history of PE with recent total knee replacement on the right.  She underwent right total knee arthroplasty in December 23, 7024 complicated by A. fib RVR requiring prolonged hospitalization.  She has had significant pain and difficulty with mobility but did not want to go to rehab and was discharged home.  She presented due to worsening shortness of breath on exertion.  She also had episode of orthostatic hypotension at home.  He has been taking Xarelto. She has been evaluated by orthopedic, who is recommending IV antibiotics for mild knee cellulitis. Underwent a CT angio chest which was negative for PE, Doppler lower extremity negative for DVT.  Patient was found to have prostatic knee infection, with MRSA.  She underwent  irrigation and debridement of right knee and Poly exchange on 12/02.  ID was consulted patient will be discharged on daptomycin for 6 weeks, subsequently she will require 6 months of oral antibiotics. Hb  has decreased to 7.8.  She will receive 2 units of packed red blood cell.    Assessment & Plan:   Principal Problem:   Dyspnea Active Problems:   Paroxysmal atrial fibrillation (HCC)   Status post total right knee replacement   Tachypnea   Cellulitis of right knee   Chronic anticoagulation   1-s/p total Right  knee replacement,  superimposed cellulitis right knee drainage, Infection status post TKA. -Underwent irrigation and debridement of right knee 12/02 -She will need 6 weeks daptomycin IV till 02/15/2021 follow by 6 month oral treatment.  -Culture growing MRSA,ID recommend Daptomycin  for 6 weeks.  -Discharge in 1 or 2 days when ok by Ortho.   2-Dyspnea on exertion, Tachypnea secondary likely to anemia of acute blood loss: CTA negative for PE.  Doppler  lower extremity negative for DVT Echo Normal EF, LVF.  EKG: Sinus rhythm.Troponin negative. Suspect deconditioning. Oxygen sat normal on ambulation with OT>  Hb down to 7.8 plan to proceed with 2 units PRBC  3-Paroxysmal A. fib: Continue with metoprolol.  EKG sinus rhythm. Holding xarelto due to Right  knee hematoma.  Continue with Xarelto  4-Chronic anticoagulation; Back on xarelto.   5-Anemia from acute blood loss; Started ferrous sulfate.  Hd down to 8.9--8.2.--7.8 transfuse 2 unit PRBC> 12/06, acceptable rise in hemoglobin up to 9.6  6-Constipation; started on Miralax, and senna.   Estimated body mass index is 49.28 kg/m as calculated from the following:   Height as of 12/23/20: 5\' 6"  (1.676 m).   Weight as of this encounter: 138.5 kg.   DVT prophylaxis: SCD Code Status: Full code Family Communication: care discussed with patient.  Disposition Plan:  Status is: Inpatient  Remains inpatient appropriate because: Patient admitted with dyspnea on exertion, recent total knee replacement. Awaiting SNF   Consultants:  Dr. Ninfa Linden Procedures:  ECHO  Antimicrobials:    Subjective:  Overall well no distress, not on oxygen no chest pain no fever no chills  Objective: Vitals:   01/10/21 2135 01/11/21 0618 01/11/21 0924 01/11/21 1023  BP: (!) 128/56 (!) 114/55 (!) 114/55 124/60  Pulse: 85 75 74 74  Resp: 18 18 17 18   Temp: 98.4 F (36.9 C) 98.2 F (36.8 C) 97.8 F (36.6 C) 98.2 F (36.8 C)  TempSrc: Oral Oral  Oral  SpO2: 97% 96% 94% 93%  Weight:  Intake/Output Summary (Last 24 hours) at 01/11/2021 1413 Last data filed at 01/11/2021 0300 Gross per 24 hour  Intake 898 ml  Output 500 ml  Net 398 ml    Filed Weights   01/09/21 1101 01/09/21 1616  Weight: (!) 146 kg (!) 138.5 kg    Examination:  General exam: NAD EOMI thick neck Mallampati 4 Respiratory system: CTA, no rales or added sound Cardiovascular system: S 1, S 2 RRR Gastrointestinal  system: BS present, soft nt, no organomegaly Central nervous system; Alert Extremities: Right knee with clean dressing in place, wound vac  Data Reviewed: I have personally reviewed following labs and imaging studies  CBC: Recent Labs  Lab 01/07/21 0322 01/08/21 0313 01/09/21 1517 01/10/21 0410 01/11/21 0442  WBC 11.4* 9.0 7.6 6.6 8.2  HGB 8.9* 8.2* 8.6* 7.8* 9.7*  HCT 30.1* 27.9* 28.7* 27.2* 32.4*  MCV 80.3 81.3 82.5 83.2 82.7  PLT 395 342 320 302 220    Basic Metabolic Panel: Recent Labs  Lab 01/06/21 0734 01/08/21 0313 01/09/21 1517 01/10/21 0410  NA 137 138 134* 137  K 4.3 4.1 3.8 3.8  CL 107 107 105 104  CO2 23 26 24 26   GLUCOSE 72 83 106* 91  BUN 11 10 9  6*  CREATININE 0.69 0.55 0.48 0.48  CALCIUM 8.2* 8.0* 8.2* 8.2*    GFR: Estimated Creatinine Clearance: 106.1 mL/min (by C-G formula based on SCr of 0.48 mg/dL). Liver Function Tests: No results for input(s): AST, ALT, ALKPHOS, BILITOT, PROT, ALBUMIN in the last 168 hours.  No results for input(s): LIPASE, AMYLASE in the last 168 hours. No results for input(s): AMMONIA in the last 168 hours. Coagulation Profile: No results for input(s): INR, PROTIME in the last 168 hours. Cardiac Enzymes: Recent Labs  Lab 01/10/21 0410  CKTOTAL 54    BNP (last 3 results) No results for input(s): PROBNP in the last 8760 hours. HbA1C: No results for input(s): HGBA1C in the last 72 hours. CBG: No results for input(s): GLUCAP in the last 168 hours. Lipid Profile: No results for input(s): CHOL, HDL, LDLCALC, TRIG, CHOLHDL, LDLDIRECT in the last 72 hours. Thyroid Function Tests: No results for input(s): TSH, T4TOTAL, FREET4, T3FREE, THYROIDAB in the last 72 hours. Anemia Panel: No results for input(s): VITAMINB12, FOLATE, FERRITIN, TIBC, IRON, RETICCTPCT in the last 72 hours. Sepsis Labs: No results for input(s): PROCALCITON, LATICACIDVEN in the last 168 hours.  Recent Results (from the past 240 hour(s))  Resp  Panel by RT-PCR (Flu A&B, Covid) Nasopharyngeal Swab     Status: None   Collection Time: 01/03/21  4:10 PM   Specimen: Nasopharyngeal Swab; Nasopharyngeal(NP) swabs in vial transport medium  Result Value Ref Range Status   SARS Coronavirus 2 by RT PCR NEGATIVE NEGATIVE Final    Comment: (NOTE) SARS-CoV-2 target nucleic acids are NOT DETECTED.  The SARS-CoV-2 RNA is generally detectable in upper respiratory specimens during the acute phase of infection. The lowest concentration of SARS-CoV-2 viral copies this assay can detect is 138 copies/mL. A negative result does not preclude SARS-Cov-2 infection and should not be used as the sole basis for treatment or other patient management decisions. A negative result may occur with  improper specimen collection/handling, submission of specimen other than nasopharyngeal swab, presence of viral mutation(s) within the areas targeted by this assay, and inadequate number of viral copies(<138 copies/mL). A negative result must be combined with clinical observations, patient history, and epidemiological information. The expected result is Negative.  Fact Sheet for  Patients:  EntrepreneurPulse.com.au  Fact Sheet for Healthcare Providers:  IncredibleEmployment.be  This test is no t yet approved or cleared by the [place] FDA and  has been authorized for detection and/or diagnosis of SARS-CoV-2 by FDA under an Emergency Use Authorization (EUA). This EUA will remain  in effect (meaning this test can be used) for the duration of the COVID-19 declaration under Section 564(b)(1) of the Act, 21 U.S.C.section 360bbb-3(b)(1), unless the authorization is terminated  or revoked sooner.       Influenza A by PCR NEGATIVE NEGATIVE Final   Influenza B by PCR NEGATIVE NEGATIVE Final    Comment: (NOTE) The Xpert Xpress SARS-CoV-2/FLU/RSV plus assay is intended as an aid in the diagnosis of influenza from Nasopharyngeal  swab specimens and should not be used as a sole basis for treatment. Nasal washings and aspirates are unacceptable for Xpert Xpress SARS-CoV-2/FLU/RSV testing.  Fact Sheet for Patients: EntrepreneurPulse.com.au  Fact Sheet for Healthcare Providers: IncredibleEmployment.be  This test is not yet approved or cleared by the [place] FDA and has been authorized for detection and/or diagnosis of SARS-CoV-2 by FDA under an Emergency Use Authorization (EUA). This EUA will remain in effect (meaning this test can be used) for the duration of the COVID-19 declaration under Section 564(b)(1) of the Act, 21 U.S.C. section 360bbb-3(b)(1), unless the authorization is terminated or revoked.  Performed at [facility], [place] [address] [place], [place] [address]   Urine Culture     Status: Abnormal   Collection Time: 01/03/21  6:19 PM   Specimen: Urine, Clean Catch  Result Value Ref Range Status   Specimen Description URINE, CLEAN CATCH  Final   Special Requests   Final    NONE Performed at [facility], [place] [address] [place], [place] [address]    Culture MULTIPLE SPECIES PRESENT, SUGGEST RECOLLECTION (A)  Final   Report Status 01/04/2021 FINAL  Final  Aerobic/Anaerobic Culture w Gram Stain (surgical/deep wound)     Status: None (Preliminary result)   Collection Time: 01/06/21  4:35 PM   Specimen: PATH Cytology Misc. fluid; Body Fluid  Result Value Ref Range Status   Specimen Description FLUID RIGHT KNEE  Final   Special Requests SAMPLE A  Final   Gram Stain   Final    FEW WBC PRESENT, PREDOMINANTLY MONONUCLEAR RARE GRAM POSITIVE COCCI Performed at [facility], [place] [address] [place], [place] [address]    Culture   Final    FEW STAPHYLOCOCCUS AUREUS SUSCEPTIBILITIES PERFORMED ON PREVIOUS CULTURE WITHIN THE LAST 5 DAYS. NO ANAEROBES ISOLATED; CULTURE IN PROGRESS FOR 5 DAYS    Report Status PENDING  Incomplete   Aerobic/Anaerobic Culture w Gram Stain (surgical/deep wound)     Status: None (Preliminary result)   Collection Time: 01/06/21  4:38 PM   Specimen: PATH Cytology Misc. fluid; Body Fluid  Result Value Ref Range Status   Specimen Description JOINT FLUID RIGHT KNEE  Final   Special Requests SAMPLE A  Final   Gram Stain   Final    MODERATE WBC PRESENT, PREDOMINANTLY MONONUCLEAR FEW GRAM POSITIVE COCCI Performed at [facility], [place] [address], [place], [place] [address]    Culture   Final    MODERATE METHICILLIN RESISTANT STAPHYLOCOCCUS AUREUS NO ANAEROBES ISOLATED; CULTURE IN PROGRESS FOR 5 DAYS    Report Status PENDING  Incomplete   Organism ID, Bacteria METHICILLIN RESISTANT STAPHYLOCOCCUS AUREUS  Final      Susceptibility  Methicillin resistant staphylococcus aureus - MIC*    CIPROFLOXACIN >=8 RESISTANT Resistant     ERYTHROMYCIN >=8 RESISTANT Resistant     GENTAMICIN <=0.5 SENSITIVE Sensitive     OXACILLIN >=4 RESISTANT Resistant     TETRACYCLINE >=16 RESISTANT Resistant     VANCOMYCIN <=0.5 SENSITIVE Sensitive     TRIMETH/SULFA <=10 SENSITIVE Sensitive     CLINDAMYCIN >=8 RESISTANT Resistant     RIFAMPIN <=0.5 SENSITIVE Sensitive     Inducible Clindamycin NEGATIVE Sensitive     * MODERATE METHICILLIN RESISTANT STAPHYLOCOCCUS AUREUS          Radiology Studies: No results found.      Scheduled Meds:  Chlorhexidine Gluconate Cloth  6 each Topical Q0600   ferrous sulfate  325 mg Oral Q breakfast   fesoterodine  4 mg Oral Daily   folic acid  1 mg Oral Daily   metoprolol succinate  50 mg Oral Daily   multivitamin with minerals  1 tablet Oral Daily   pantoprazole  40 mg Oral Daily   polyethylene glycol  17 g Oral BID   rivaroxaban  20 mg Oral Q supper   saccharomyces boulardii  250 mg Oral BID   senna  1 tablet Oral BID   sodium chloride flush  10-40 mL Intracatheter Q12H   Continuous Infusions:  DAPTOmycin (CUBICIN)  IV 750 mg (01/10/21 2024)       LOS: 8 days    Time spent: 25 minutes    Nita Sells, MD [facility]   If 7PM-7AM, please contact night-coverage [email or web address]  01/11/2021, 2:13 PM

## 2021-01-11 NOTE — Progress Notes (Signed)
Patient ID: [name], female   DOB: [birth year], 60 y.o.   MRN: [id number] I did speak to [name] at the bedside.  My plan is to remove the incisional VAC at the bedside tomorrow morning and place dry dressings.  The goal will be to probably hopefully get a discharge to home with home health physical therapy and home health nursing for her IV therapies by Friday.

## 2021-01-11 NOTE — Plan of Care (Signed)

## 2021-01-11 NOTE — Hospital Course (Signed)
PER pt Pt making gradual progress.  She is able to complete transfers with supervision-min guard level.  Only ambulated 8' , limited by dizziness but was first time OOB today and just received 2 units of PRBC.  Pt expressing that she is not happy with her SNF options and considering home at d/c.  PT and pt discussed.  Pt with DME including w/c for longer distances and ramp to enter home.  She has supportive spouse who is able to physically assist if needed.  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  Feel that pt has resources and mobility to progress to home level if can increase gait ability.

## 2021-01-11 NOTE — Progress Notes (Signed)
[place] for Infectious Disease  Date of Admission:  01/03/2021   Total days of inpatient antibiotics 7  Principal Problem:   Dyspnea Active Problems:   Paroxysmal atrial fibrillation (HCC)   Status post total right knee replacement   Tachypnea   Cellulitis of right knee   Chronic anticoagulation          Assessment: 60 YM  PMHx of morbid obesity, PAF, OSA, Hx of PE with recent total knee replacement on the right on 48/88/91 complicated by afib rvr. Pt was discharged home as she did not want to go to rehab. She presented due to worsening pain and admitted for PJI.   # MRSA Right Knee PJI SP I&D and poly-exchange -TTE showed no evidence of vegetations.  Recommendations: -Continue Vancomycin and ceftriaxone  -Anticipate daptomycin on discharge for atleast 6 weeks(EOT 02/19/20) followed by suppressive PO therapy fpr about 6 months -She is on Xarelto as such rifampin is contraindicated -ID will sign off. Follow-up scheduled with ID  OPAT ORDERS:  Diagnosis: MRSA PJI  Culture Result: MRSA  Allergies  Allergen Reactions   Contrast Media [Iodinated Diagnostic Agents] Shortness Of Breath and Other (See Comments)    APNEA   Ancef [Cefazolin Sodium] Nausea And Vomiting    Tolerates IV     Discharge antibiotics to be given via PICC line:  Per pharmacy protocol daptomycin Aim for Vancomycin trough 15-20 or AUC 400-550 (unless otherwise indicated)   Duration: 6 weeks  End Date: 02/19/20  [facility] Care Per Protocol with Biopatch Use: Home health RN for IV administration and teaching, line care and labs.    Labs weekly while on IV antibiotics: _y_ CBC with differential __ BMP **TWICE WEEKLY ON VANCOMYCIN  _y_ CMP __y CRP _y_ ESR __ Vancomycin trough TWICE WEEKLY _y_ CK  _y_ Please pull PIC at completion of IV antibiotics __ Please leave PIC in place until doctor has seen patient or been notified  Fax weekly labs to [phone]  Clinic Follow Up  Appt: 02/08/21 at 3:30 pm  @ [facility] with Dr. Candiss Norse  Microbiology:   Antibiotics: Bactrim 11/29-12/1 Ceftriaxone 11/30, 12/3, 12/4 Cefepime 12/1, 12/2 Vancomycin 12/1-p  Cultures: OR Cx: 12/2 MRSA Urine 11/29 Urine Cx    SUBJECTIVE: Pt is resting in bed. She had question about clearance of infection in the setting of retained hardware. Counseled that she will need IV antibiotics followed by PO regimen. We will continue to follow in clinic.  Review of Systems: Review of Systems  All other systems reviewed and are negative.   Scheduled Meds:  Chlorhexidine Gluconate Cloth  6 each Topical Q0600   ferrous sulfate  325 mg Oral Q breakfast   fesoterodine  4 mg Oral Daily   folic acid  1 mg Oral Daily   metoprolol succinate  50 mg Oral Daily   multivitamin with minerals  1 tablet Oral Daily   pantoprazole  40 mg Oral Daily   polyethylene glycol  17 g Oral BID   rivaroxaban  20 mg Oral Q supper   saccharomyces boulardii  250 mg Oral BID   senna  1 tablet Oral BID   sodium chloride flush  10-40 mL Intracatheter Q12H   Continuous Infusions:  DAPTOmycin (CUBICIN)  IV 750 mg (01/10/21 2024)   PRN Meds:.acetaminophen **OR** acetaminophen, albuterol, HYDROmorphone (DILAUDID) injection, methocarbamol, oxyCODONE, senna-docusate, sodium chloride flush Allergies  Allergen Reactions   Contrast Media [Iodinated Diagnostic Agents] Shortness Of Breath and  Other (See Comments)    APNEA   Ancef [Cefazolin Sodium] Nausea And Vomiting    Tolerates IV    OBJECTIVE: Vitals:   01/10/21 2135 01/11/21 0618 01/11/21 0924 01/11/21 1023  BP: (!) 128/56 (!) 114/55 (!) 114/55 124/60  Pulse: 85 75 74 74  Resp: _0 Temp: 98.4 F (36.9 C) 98.2 F (36.8 C) 97.8 F (36.6 C) 98.2 F (36.8 C)  TempSrc: Oral Oral  Oral  SpO2: 97% 96% 94% 93%  Weight:       Body mass index is 49.28 kg/m.  Physical Exam Constitutional:      Appearance: Normal appearance.  HENT:     Head: Normocephalic  and atraumatic.     Right Ear: Tympanic membrane normal.     Left Ear: Tympanic membrane normal.     Nose: Nose normal.     Mouth/Throat:     Mouth: Mucous membranes are moist.  Eyes:     Extraocular Movements: Extraocular movements intact.     Conjunctiva/sclera: Conjunctivae normal.     Pupils: Pupils are equal, round, and reactive to light.  Cardiovascular:     Rate and Rhythm: Normal rate and regular rhythm.     Heart sounds: No murmur heard.   No friction rub. No gallop.  Pulmonary:     Effort: Pulmonary effort is normal.     Breath sounds: Normal breath sounds.  Abdominal:     General: Abdomen is flat.     Palpations: Abdomen is soft.  Musculoskeletal:        General: Normal range of motion.     Comments: Right knee wound vac Left knee TKA site  Skin:    General: Skin is warm and dry.  Neurological:     General: No focal deficit present.     Mental Status: She is alert and oriented to person, place, and time.  Psychiatric:        Mood and Affect: Mood normal.      Lab Results Lab Results  Component Value Date   WBC 8.2 01/11/2021   HGB 9.7 (L) 01/11/2021   HCT 32.4 (L) 01/11/2021   MCV 82.7 01/11/2021   PLT 296 01/11/2021    Lab Results  Component Value Date   CREATININE 0.48 01/10/2021   BUN 6 (L) 01/10/2021   NA 137 01/10/2021   K 3.8 01/10/2021   CL 104 01/10/2021   CO2 26 01/10/2021    Lab Results  Component Value Date   ALT 20 01/03/2021   AST 30 01/03/2021   ALKPHOS 55 01/03/2021   BILITOT 0.7 01/03/2021        Laurice Record, [place] for Infectious Disease [facility] 01/11/2021, 10:56 AM

## 2021-01-11 NOTE — Progress Notes (Signed)
PHARMACY CONSULT NOTE FOR:  OUTPATIENT  PARENTERAL ANTIBIOTIC THERAPY (OPAT)  Indication: prosthetic knee infection Regimen: daptomycin 750mg  IV q24h End date: 02/18/21  IV antibiotic discharge orders are pended. To discharging provider:  please sign these orders via discharge navigator,  Select New Orders & click on the button choice - Manage This Unsigned Work.     Thank you for allowing pharmacy to be a part of this patient's care.  Candie Mile 01/11/2021, 10:25 AM

## 2021-01-11 NOTE — Progress Notes (Signed)
Physical Therapy Treatment Patient Details Name: [name] MRN: [id number] DOB: 03/24/1960 Today's Date: 01/11/2021   History of Present Illness Pt is 60 y.o. female presents to [facility] hospital on 01/03/2021 with reports of SOB and DOE. Pt underwent R TKA 41/93, complicated by Afib with RVR. Pt admitted for management of Afib and RLE cellulitis.  Pt found to have MRSA infection and to OR 12/2 for R knee I&D, polyliner exchange, and wound vac placement. XTK:WIOXBD obesity, PAF, OA, hx of PE, anemia, CHF    PT Comments    Pt progressing steadily towards her physical therapy goals. Session focused on therapeutic exercises for ROM/strengthening and progressive ambulation. Pt ambulating 30 feet with a walker and chair follow, utilizing step to pattern. Updated d/c plan as pt goal is to return home. She has a ramp to enter her home as well as appropriate DME and assist.     Recommendations for follow up therapy are one component of a multi-disciplinary discharge planning process, led by the attending physician.  Recommendations may be updated based on patient status, additional functional criteria and insurance authorization.  Follow Up Recommendations  Home health PT     Assistance Recommended at Discharge Frequent or constant Supervision/Assistance  Equipment Recommendations  None recommended by PT    Recommendations for Other Services       Precautions / Restrictions Precautions Precautions: Fall;Knee Precaution Comments: wound vac Restrictions Weight Bearing Restrictions: Yes RLE Weight Bearing: Weight bearing as tolerated     Mobility  Bed Mobility Overal bed mobility: Needs Assistance Bed Mobility: Supine to Sit     Supine to sit: Supervision     General bed mobility comments: no physical assist required    Transfers Overall transfer level: Needs assistance Equipment used: Rolling walker (2 wheels) Transfers: Sit to/from Stand Sit to Stand: Min guard            General transfer comment: Increased time to rise    Ambulation/Gait Ambulation/Gait assistance: Min guard Gait Distance (Feet): 30 Feet Assistive device: Rolling walker (2 wheels) Gait Pattern/deviations: Step-to pattern;Decreased stride length;Decreased weight shift to right;Decreased dorsiflexion - right Gait velocity: decreased     General Gait Details: Pt requiring min guard, decreased R heel strike step to pattern, close chair follow utilized   Marine scientist Rankin (Stroke Patients Only)       Balance Overall balance assessment: Needs assistance Sitting-balance support: No upper extremity supported;Feet supported Sitting balance-Leahy Scale: Good     Standing balance support: Bilateral upper extremity supported;Reliant on assistive device for balance;During functional activity Standing balance-Leahy Scale: Poor Standing balance comment: Requiring RW                            Cognition Arousal/Alertness: Awake/alert Behavior During Therapy: WFL for tasks assessed/performed Overall Cognitive Status: Within Functional Limits for tasks assessed                                          Exercises Total Joint Exercises Quad Sets: Both;15 reps;Other reps (comment) Heel Slides: AAROM;Right;20 reps;Supine;Seated Hip ABduction/ADduction: Left;5 reps;Supine Long Arc Quad: Right;20 reps;Seated    General Comments        Pertinent Vitals/Pain Pain Assessment: Faces Faces Pain Scale: Hurts even more Pain  Location: posterior R knee Pain Descriptors / Indicators: Discomfort;Sore Pain Intervention(s): Limited activity within patient's tolerance;Monitored during session    Home Living                          Prior Function            PT Goals (current goals can now be found in the care plan section) Acute Rehab PT Goals Patient Stated Goal: independence Potential to Achieve  Goals: Good Progress towards PT goals: Progressing toward goals    Frequency    Min 4X/week      PT Plan Current plan remains appropriate    Co-evaluation              AM-PAC PT "6 Clicks" Mobility   Outcome Measure  Help needed turning from your back to your side while in a flat bed without using bedrails?: A Little Help needed moving from lying on your back to sitting on the side of a flat bed without using bedrails?: A Little Help needed moving to and from a bed to a chair (including a wheelchair)?: A Little Help needed standing up from a chair using your arms (e.g., wheelchair or bedside chair)?: A Little Help needed to walk in hospital room?: A Little Help needed climbing 3-5 steps with a railing? : Total 6 Click Score: 16    End of Session Equipment Utilized During Treatment: Gait belt Activity Tolerance: Patient tolerated treatment well Patient left: in chair;with call bell/phone within reach;with family/visitor present Nurse Communication: Mobility status PT Visit Diagnosis: Other abnormalities of gait and mobility (R26.89);Pain Pain - Right/Left: Right Pain - part of body: Knee     Time: 5093-2671 PT Time Calculation (min) (ACUTE ONLY): 25 min  Charges:  $Gait Training: 8-22 mins $Therapeutic Exercise: 8-22 mins                     Wyona Almas, PT, DPT Acute Rehabilitation Services Pager [phone] Office [phone]    Deno Etienne 01/11/2021, 12:56 PM

## 2021-01-12 ENCOUNTER — Encounter: Payer: No Typology Code available for payment source | Admitting: Physician Assistant

## 2021-01-12 DIAGNOSIS — R0609 Other forms of dyspnea: Secondary | ICD-10-CM | POA: Diagnosis not present

## 2021-01-12 LAB — CBC WITH DIFFERENTIAL/PLATELET
Abs Immature Granulocytes: 0.16 10*3/uL — ABNORMAL HIGH (ref 0.00–0.07)
Basophils Absolute: 0.1 10*3/uL (ref 0.0–0.1)
Basophils Relative: 1 %
Eosinophils Absolute: 0.2 10*3/uL (ref 0.0–0.5)
Eosinophils Relative: 4 %
HCT: 33.1 % — ABNORMAL LOW (ref 36.0–46.0)
Hemoglobin: 10 g/dL — ABNORMAL LOW (ref 12.0–15.0)
Immature Granulocytes: 3 %
Lymphocytes Relative: 28 %
Lymphs Abs: 1.6 10*3/uL (ref 0.7–4.0)
MCH: 25.1 pg — ABNORMAL LOW (ref 26.0–34.0)
MCHC: 30.2 g/dL (ref 30.0–36.0)
MCV: 83.2 fL (ref 80.0–100.0)
Monocytes Absolute: 0.5 10*3/uL (ref 0.1–1.0)
Monocytes Relative: 9 %
Neutro Abs: 3.2 10*3/uL (ref 1.7–7.7)
Neutrophils Relative %: 55 %
Platelets: 306 10*3/uL (ref 150–400)
RBC: 3.98 MIL/uL (ref 3.87–5.11)
RDW: 18.6 % — ABNORMAL HIGH (ref 11.5–15.5)
WBC: 5.7 10*3/uL (ref 4.0–10.5)
nRBC: 0 % (ref 0.0–0.2)

## 2021-01-12 LAB — AEROBIC/ANAEROBIC CULTURE W GRAM STAIN (SURGICAL/DEEP WOUND)

## 2021-01-12 LAB — COMPREHENSIVE METABOLIC PANEL
ALT: 20 U/L (ref 0–44)
AST: 30 U/L (ref 15–41)
Albumin: 2.3 g/dL — ABNORMAL LOW (ref 3.5–5.0)
Alkaline Phosphatase: 66 U/L (ref 38–126)
Anion gap: 7 (ref 5–15)
BUN: 6 mg/dL — ABNORMAL LOW (ref 8–23)
CO2: 25 mmol/L (ref 22–32)
Calcium: 8.4 mg/dL — ABNORMAL LOW (ref 8.9–10.3)
Chloride: 102 mmol/L (ref 98–111)
Creatinine, Ser: 0.55 mg/dL (ref 0.44–1.00)
GFR, Estimated: >60 mL/min (ref >60)
Glucose, Bld: 97 mg/dL (ref 70–99)
Potassium: 3.9 mmol/L (ref 3.5–5.1)
Sodium: 134 mmol/L — ABNORMAL LOW (ref 135–145)
Total Bilirubin: 0.6 mg/dL (ref 0.3–1.2)
Total Protein: 6 g/dL — ABNORMAL LOW (ref 6.5–8.1)

## 2021-01-12 NOTE — Plan of Care (Signed)

## 2021-01-12 NOTE — Discharge Instructions (Signed)
You may increase her activities as comfort allows. You can still work on gentle range of motion of the right knee and weightbearing as tolerated. Keep the dressing clean and dry until your outpatient follow-up appointment.

## 2021-01-12 NOTE — Progress Notes (Signed)
Hemoglobin dropped during hospitalization and patient was transfused PROGRESS NOTE    [name]  VHQ:[id number] DOB: 04-Nov-1960 DOA: 01/03/2021 PCP: Mateo Flow, MD   Brief Narrative: 60 year old with past medical history significant for morbid obesity, PAF, OSA, history of PE with recent total knee replacement on the right.  She underwent right total knee arthroplasty in December 24, 4130 complicated by A. fib RVR requiring prolonged hospitalization.  She has had significant pain and difficulty with mobility but did not want to go to rehab and was discharged home.  She presented due to worsening shortness of breath on exertion.  She also had episode of orthostatic hypotension at home.  He has been taking Xarelto. She has been evaluated by orthopedic, who is recommending IV antibiotics for mild knee cellulitis. Underwent a CT angio chest which was negative for PE, Doppler lower extremity negative for DVT.  Patient was found to have prostatic knee infection, with MRSA.  She underwent  irrigation and debridement of right knee and Poly exchange on 12/02.  ID was consulted patient will be discharged on daptomycin for 6 weeks, subsequently she will require 6 months of oral antibiotics. Hb  has decreased to 7.8.  She will receive 2 units of packed red blood cell.    Assessment & Plan:   Principal Problem:   Dyspnea Active Problems:   Paroxysmal atrial fibrillation (HCC)   Status post total right knee replacement   Tachypnea   Cellulitis of right knee   Chronic anticoagulation   1-s/p total Right  knee replacement,  superimposed cellulitis right knee drainage, Infection status post TKA. -Underwent irrigation and debridement of right knee 12/02 -Wound VAC removed 12/8 patient examined by Ortho -She will need 6 weeks daptomycin IV till 02/15/2021 follow by 6 month oral treatment.  -Culture growing MRSA,ID recommend Daptomycin  for 6 weeks.  -Stabilizing for discharge per  Ortho  2-Dyspnea on exertion, Tachypnea secondary likely to anemia of acute blood loss: CTA negative for PE.  Doppler lower extremity negative for DVT Echo Normal EF, LVF.  EKG: Sinus rhythm.Troponin negative. Suspect deconditioning. Oxygen sat normal on ambulation with OT>   3-Paroxysmal A. fib: Continue with metoprolol.  EKG sinus rhythm. Xarelto initially was held 2/2 hematoma and then resumed  4-Chronic anticoagulation; Back on xarelto.   5-Anemia from acute blood loss; Started ferrous sulfate.  Hd down to 8.9--8.2.--7.8 transfuse 2 unit PRBC> 12/06, acceptable rise in hemoglobin  6-Constipation; started on Miralax, and senna.   Estimated body mass index is 50.14 kg/m as calculated from the following:   Height as of 12/23/20: 5\' 6"  (1.676 m).   Weight as of this encounter: 140.9 kg.   DVT prophylaxis: SCD Code Status: Full code Family Communication: care discussed with patient.  Disposition Plan:  Status is: Inpatient  Remains inpatient appropriate because: we will discharge home with home health in a.m.   Consultants:  Dr. Ninfa Linden Procedures:  ECHO  Antimicrobials:    Subjective:  no distress pain is controlled Wound VAC discontinued by Ortho this morning No chest pain awake coherent no distress   Objective: Vitals:   01/11/21 1023 01/11/21 1724 01/11/21 2054 01/12/21 0500  BP: 124/60 136/60 (!) 117/55 124/63  Pulse: 74 83 78 73  Resp: 18 18 18 18   Temp: 98.2 F (36.8 C) 98.6 F (37 C) 98.3 F (36.8 C) 98.3 F (36.8 C)  TempSrc: Oral Oral Oral Oral  SpO2: 93% 96% 93% 95%  Weight:   (!) 140.9 kg  Intake/Output Summary (Last 24 hours) at 01/12/2021 1002 Last data filed at 01/12/2021 0400 Gross per 24 hour  Intake 460 ml  Output 800 ml  Net -340 ml    Filed Weights   01/09/21 1101 01/09/21 1616 01/11/21 2054  Weight: (!) 146 kg (!) 138.5 kg (!) 140.9 kg    Examination:  EOMI NCAT thick neck S1-S2 no murmur Chest clear Telemetry  reviewed sinus rhythm Abdomen soft Right lower extremity has reinforced Coban dressing able to wiggle toes  Data Reviewed: I have personally reviewed following labs and imaging studies  CBC: Recent Labs  Lab 01/08/21 0313 01/09/21 1517 01/10/21 0410 01/11/21 0442 01/12/21 0417  WBC 9.0 7.6 6.6 8.2 5.7  NEUTROABS  --   --   --   --  3.2  HGB 8.2* 8.6* 7.8* 9.7* 10.0*  HCT 27.9* 28.7* 27.2* 32.4* 33.1*  MCV 81.3 82.5 83.2 82.7 83.2  PLT 342 320 302 296 619    Basic Metabolic Panel: Recent Labs  Lab 01/06/21 0734 01/08/21 0313 01/09/21 1517 01/10/21 0410 01/12/21 0417  NA 137 138 134* 137 134*  K 4.3 4.1 3.8 3.8 3.9  CL 107 107 105 104 102  CO2 23 26 24 26 25   GLUCOSE 72 83 106* 91 97  BUN 11 10 9  6* 6*  CREATININE 0.69 0.55 0.48 0.48 0.55  CALCIUM 8.2* 8.0* 8.2* 8.2* 8.4*    GFR: Estimated Creatinine Clearance: 107.1 mL/min (by C-G formula based on SCr of 0.55 mg/dL). Liver Function Tests: Recent Labs  Lab 01/12/21 0417  AST 30  ALT 20  ALKPHOS 66  BILITOT 0.6  PROT 6.0*  ALBUMIN 2.3*    No results for input(s): LIPASE, AMYLASE in the last 168 hours. No results for input(s): AMMONIA in the last 168 hours. Coagulation Profile: No results for input(s): INR, PROTIME in the last 168 hours. Cardiac Enzymes: Recent Labs  Lab 01/10/21 0410  CKTOTAL 54    BNP (last 3 results) No results for input(s): PROBNP in the last 8760 hours. HbA1C: No results for input(s): HGBA1C in the last 72 hours. CBG: No results for input(s): GLUCAP in the last 168 hours. Lipid Profile: No results for input(s): CHOL, HDL, LDLCALC, TRIG, CHOLHDL, LDLDIRECT in the last 72 hours. Thyroid Function Tests: No results for input(s): TSH, T4TOTAL, FREET4, T3FREE, THYROIDAB in the last 72 hours. Anemia Panel: No results for input(s): VITAMINB12, FOLATE, FERRITIN, TIBC, IRON, RETICCTPCT in the last 72 hours. Sepsis Labs: No results for input(s): PROCALCITON, LATICACIDVEN in the last  168 hours.  Recent Results (from the past 240 hour(s))  Resp Panel by RT-PCR (Flu A&B, Covid) Nasopharyngeal Swab     Status: None   Collection Time: 01/03/21  4:10 PM   Specimen: Nasopharyngeal Swab; Nasopharyngeal(NP) swabs in vial transport medium  Result Value Ref Range Status   SARS Coronavirus 2 by RT PCR NEGATIVE NEGATIVE Final    Comment: (NOTE) SARS-CoV-2 target nucleic acids are NOT DETECTED.  The SARS-CoV-2 RNA is generally detectable in upper respiratory specimens during the acute phase of infection. The lowest concentration of SARS-CoV-2 viral copies this assay can detect is 138 copies/mL. A negative result does not preclude SARS-Cov-2 infection and should not be used as the sole basis for treatment or other patient management decisions. A negative result may occur with  improper specimen collection/handling, submission of specimen other than nasopharyngeal swab, presence of viral mutation(s) within the areas targeted by this assay, and inadequate number of viral copies(<138 copies/mL). A  negative result must be combined with clinical observations, patient history, and epidemiological information. The expected result is Negative.  Fact Sheet for Patients:  EntrepreneurPulse.com.au  Fact Sheet for Healthcare Providers:  IncredibleEmployment.be  This test is no t yet approved or cleared by the [place] FDA and  has been authorized for detection and/or diagnosis of SARS-CoV-2 by FDA under an Emergency Use Authorization (EUA). This EUA will remain  in effect (meaning this test can be used) for the duration of the COVID-19 declaration under Section 564(b)(1) of the Act, 21 U.S.C.section 360bbb-3(b)(1), unless the authorization is terminated  or revoked sooner.       Influenza A by PCR NEGATIVE NEGATIVE Final   Influenza B by PCR NEGATIVE NEGATIVE Final    Comment: (NOTE) The Xpert Xpress SARS-CoV-2/FLU/RSV plus assay is  intended as an aid in the diagnosis of influenza from Nasopharyngeal swab specimens and should not be used as a sole basis for treatment. Nasal washings and aspirates are unacceptable for Xpert Xpress SARS-CoV-2/FLU/RSV testing.  Fact Sheet for Patients: EntrepreneurPulse.com.au  Fact Sheet for Healthcare Providers: IncredibleEmployment.be  This test is not yet approved or cleared by the [place] FDA and has been authorized for detection and/or diagnosis of SARS-CoV-2 by FDA under an Emergency Use Authorization (EUA). This EUA will remain in effect (meaning this test can be used) for the duration of the COVID-19 declaration under Section 564(b)(1) of the Act, 21 U.S.C. section 360bbb-3(b)(1), unless the authorization is terminated or revoked.  Performed at [facility], [place] [address], [place], [place] [address]   Urine Culture     Status: Abnormal   Collection Time: 01/03/21  6:19 PM   Specimen: Urine, Clean Catch  Result Value Ref Range Status   Specimen Description URINE, CLEAN CATCH  Final   Special Requests   Final    NONE Performed at [facility], [place] [address], [place], [place] [address]    Culture MULTIPLE SPECIES PRESENT, SUGGEST RECOLLECTION (A)  Final   Report Status 01/04/2021 FINAL  Final  Aerobic/Anaerobic Culture w Gram Stain (surgical/deep wound)     Status: None (Preliminary result)   Collection Time: 01/06/21  4:35 PM   Specimen: PATH Cytology Misc. fluid; Body Fluid  Result Value Ref Range Status   Specimen Description FLUID RIGHT KNEE  Final   Special Requests SAMPLE A  Final   Gram Stain   Final    FEW WBC PRESENT, PREDOMINANTLY MONONUCLEAR RARE GRAM POSITIVE COCCI Performed at [facility], [place] [address], [place], [place] [address]    Culture   Final    FEW STAPHYLOCOCCUS AUREUS SUSCEPTIBILITIES PERFORMED ON PREVIOUS CULTURE WITHIN THE LAST 5 DAYS. NO ANAEROBES ISOLATED; CULTURE IN  PROGRESS FOR 5 DAYS    Report Status PENDING  Incomplete  Aerobic/Anaerobic Culture w Gram Stain (surgical/deep wound)     Status: None (Preliminary result)   Collection Time: 01/06/21  4:38 PM   Specimen: PATH Cytology Misc. fluid; Body Fluid  Result Value Ref Range Status   Specimen Description JOINT FLUID RIGHT KNEE  Final   Special Requests SAMPLE A  Final   Gram Stain   Final    MODERATE WBC PRESENT, PREDOMINANTLY MONONUCLEAR FEW GRAM POSITIVE COCCI Performed at [facility], [place] [address], [place], [place] [address]    Culture   Final    MODERATE METHICILLIN RESISTANT STAPHYLOCOCCUS AUREUS NO ANAEROBES ISOLATED; CULTURE IN PROGRESS FOR 5 DAYS    Report Status  PENDING  Incomplete   Organism ID, Bacteria METHICILLIN RESISTANT STAPHYLOCOCCUS AUREUS  Final      Susceptibility   Methicillin resistant staphylococcus aureus - MIC*    CIPROFLOXACIN >=8 RESISTANT Resistant     ERYTHROMYCIN >=8 RESISTANT Resistant     GENTAMICIN <=0.5 SENSITIVE Sensitive     OXACILLIN >=4 RESISTANT Resistant     TETRACYCLINE >=16 RESISTANT Resistant     VANCOMYCIN <=0.5 SENSITIVE Sensitive     TRIMETH/SULFA <=10 SENSITIVE Sensitive     CLINDAMYCIN >=8 RESISTANT Resistant     RIFAMPIN <=0.5 SENSITIVE Sensitive     Inducible Clindamycin NEGATIVE Sensitive     * MODERATE METHICILLIN RESISTANT STAPHYLOCOCCUS AUREUS     Radiology Studies: No results found.  Scheduled Meds:  Chlorhexidine Gluconate Cloth  6 each Topical Q0600   ferrous sulfate  325 mg Oral Q breakfast   fesoterodine  4 mg Oral Daily   folic acid  1 mg Oral Daily   metoprolol succinate  50 mg Oral Daily   multivitamin with minerals  1 tablet Oral Daily   pantoprazole  40 mg Oral Daily   polyethylene glycol  17 g Oral BID   rivaroxaban  20 mg Oral Q supper   saccharomyces boulardii  250 mg Oral BID   senna  1 tablet Oral BID   sodium chloride flush  10-40 mL Intracatheter Q12H   Continuous Infusions:  DAPTOmycin  (CUBICIN)  IV 750 mg (01/11/21 2038)    LOS: 9 days    Time spent: 25 minutes  Nita Sells, MD [facility]   If 7PM-7AM, please contact night-coverage [email or web address]  01/12/2021, 10:02 AM

## 2021-01-12 NOTE — Progress Notes (Signed)
Physical Therapy Treatment Patient Details Name: [name] MRN: [id number] DOB: 21-Feb-1960 Today's Date: 01/12/2021   History of Present Illness Pt is 60 y.o. female presents to [facility] on 01/03/2021 with reports of SOB and DOE. Pt underwent R TKA 28/36, complicated by Afib with RVR. Pt admitted for management of Afib and RLE cellulitis.  Pt found to have MRSA infection and to OR 12/2 for R knee I&D, polyliner exchange, and wound vac placement. OQH:UTMLYY obesity, PAF, OA, hx of PE, anemia, CHF    PT Comments    Pt reports lightheadedness with mobility and standing, proving as the greatest limiting factor in activity tolerance this session. Pt remains able to ambulate short household distances with assistance for safety. Pt continues to report sufficient assistance from spouse and has all necessary DME to return home at this time. PT provides education on reducing the risk of orthostatic events. PT continues to recommend discharge home with HHPT.   Recommendations for follow up therapy are one component of a multi-disciplinary discharge planning process, led by the attending physician.  Recommendations may be updated based on patient status, additional functional criteria and insurance authorization.  Follow Up Recommendations  Home health PT     Assistance Recommended at Discharge Frequent or constant Supervision/Assistance  Equipment Recommendations  None recommended by PT    Recommendations for Other Services       Precautions / Restrictions Precautions Precautions: Fall;Knee Restrictions Weight Bearing Restrictions: Yes RLE Weight Bearing: Weight bearing as tolerated Other Position/Activity Restrictions: WBAT     Mobility  Bed Mobility Overal bed mobility: Needs Assistance Bed Mobility: Sit to Supine     Supine to sit: Min guard (bed rail) Sit to supine: Min assist   General bed mobility comments: assist to elevate RLE    Transfers Overall transfer level:  Needs assistance Equipment used: Rolling walker (2 wheels) Transfers: Sit to/from Stand Sit to Stand: Min guard           General transfer comment: minG for safety, good technique, no cueing required    Ambulation/Gait Ambulation/Gait assistance: Min guard Gait Distance (Feet): 30 Feet (additional trial of 15') Assistive device: Rolling walker (2 wheels) Gait Pattern/deviations: Decreased stance time - right;Knee flexed in stance - right;Step-through pattern Gait velocity: reduced Gait velocity interpretation: <1.31 ft/sec, indicative of household ambulator   General Gait Details: pt with slowed step-through gait  with reduced stride length. Pt shows knee flexion during stance phase on RLE at this time   Marine scientist Rankin (Stroke Patients Only)       Balance Overall balance assessment: Needs assistance Sitting-balance support: No upper extremity supported;Feet supported Sitting balance-Leahy Scale: Good     Standing balance support: Bilateral upper extremity supported Standing balance-Leahy Scale: Poor Standing balance comment: Requiring RW                            Cognition Arousal/Alertness: Awake/alert Behavior During Therapy: WFL for tasks assessed/performed Overall Cognitive Status: Within Functional Limits for tasks assessed                                          Exercises      General Comments General comments (skin integrity, edema, etc.): HR stable, pt reports  lightheadedness after initial bout of gait, some improvement with sitting, HR in high 70s to low 80s. Pt ambulates 2nd with report of similar symptoms with transition from sit to stand, symptoms improved with supine positioning. No BP obtained as dynamap machine not near room at this time.      Pertinent Vitals/Pain Pain Assessment: No/denies pain Pain Score: 4  Breathing: normal Negative Vocalization: none Facial  Expression: smiling or inexpressive Body Language: relaxed Consolability: no need to console PAINAD Score: 0 Pain Descriptors / Indicators: Aching;Discomfort;Grimacing;Guarding Pain Intervention(s): Limited activity within patient's tolerance;Repositioned    Home Living                          Prior Function            PT Goals (current goals can now be found in the care plan section) Acute Rehab PT Goals Patient Stated Goal: independence Progress towards PT goals: Progressing toward goals    Frequency    Min 4X/week      PT Plan Current plan remains appropriate    Co-evaluation              AM-PAC PT "6 Clicks" Mobility   Outcome Measure  Help needed turning from your back to your side while in a flat bed without using bedrails?: A Little Help needed moving from lying on your back to sitting on the side of a flat bed without using bedrails?: A Little Help needed moving to and from a bed to a chair (including a wheelchair)?: A Little Help needed standing up from a chair using your arms (e.g., wheelchair or bedside chair)?: A Little Help needed to walk in hospital room?: A Little Help needed climbing 3-5 steps with a railing? : Total 6 Click Score: 16    End of Session   Activity Tolerance: Treatment limited secondary to medical complications (Comment) (lightheadedness) Patient left: in bed;with call bell/phone within reach Nurse Communication: Mobility status PT Visit Diagnosis: Other abnormalities of gait and mobility (R26.89);Pain Pain - Right/Left: Right Pain - part of body: Knee     Time: 6962-9528 PT Time Calculation (min) (ACUTE ONLY): 17 min  Charges:  $Gait Training: 8-22 mins                     Zenaida Niece, PT, DPT Acute Rehabilitation Pager: [phone] Office [phone]    Zenaida Niece 01/12/2021, 11:15 AM

## 2021-01-12 NOTE — Progress Notes (Signed)
Occupational Therapy Treatment Patient Details Name: [name] MRN: [id number] DOB: 1960-11-29 Today's Date: 01/12/2021   History of present illness Pt is 60 y.o. female presents to [facility] hospital on 01/03/2021 with reports of SOB and DOE. Pt underwent R TKA 40/98, complicated by Afib with RVR. Pt admitted for management of Afib and RLE cellulitis.  Pt found to have MRSA infection and to OR 12/2 for R knee I&D, polyliner exchange, and wound vac placement. JXB:JYNWGN obesity, PAF, OA, hx of PE, anemia, CHF   OT comments  Pt today was able to complete bed mobility with bed rail and min guard, standing ADLS at sink level for oral/handwashing with min guard to set up and toilet tasks with min guard. Pt continued education on compensations due to decrease standing tolerance and ROM for LE. Pt reported fatigue following tasks and requested to rest. Pt currently with functional limitations due to the deficits listed below (see OT Problem List).  Pt will benefit from skilled OT to increase their safety and independence with ADL and functional mobility for ADL to facilitate discharge to venue listed below.     Recommendations for follow up therapy are one component of a multi-disciplinary discharge planning process, led by the attending physician.  Recommendations may be updated based on patient status, additional functional criteria and insurance authorization.    Follow Up Recommendations  Home health OT    Assistance Recommended at Discharge Frequent or constant Supervision/Assistance  Equipment Recommendations   (educated about transfer bench)    Recommendations for Other Services      Precautions / Restrictions Precautions Precautions: Fall;Knee Restrictions Weight Bearing Restrictions: Yes RLE Weight Bearing: Weight bearing as tolerated Other Position/Activity Restrictions: WBAT       Mobility Bed Mobility Overal bed mobility: Needs Assistance Bed Mobility: Supine to Sit      Supine to sit: Min guard (bed rail)          Transfers Overall transfer level: Needs assistance Equipment used: Rolling walker (2 wheels) Transfers: Sit to/from Stand Sit to Stand: Min guard                 Balance Overall balance assessment: Needs assistance Sitting-balance support: No upper extremity supported;Feet supported Sitting balance-Leahy Scale: Good     Standing balance support: Bilateral upper extremity supported;Reliant on assistive device for balance Standing balance-Leahy Scale: Poor Standing balance comment: Requiring RW                           ADL either performed or assessed with clinical judgement   ADL Overall ADL's : Needs assistance/impaired Eating/Feeding: Independent   Grooming: Set up;Standing   Upper Body Bathing: Set up;Cueing for safety;Cueing for sequencing;Sitting   Lower Body Bathing: Moderate assistance;Cueing for safety;Cueing for sequencing;Sit to/from stand   Upper Body Dressing : Set up;Sitting;Cueing for safety;Cueing for sequencing   Lower Body Dressing: Moderate assistance;Cueing for safety;Cueing for sequencing;Sit to/from stand   Toilet Transfer: Min guard;Cueing for sequencing;Cueing for safety;Rolling walker (2 wheels);BSC/3in1   Toileting- Water quality scientist and Hygiene: Min guard;Cueing for safety;Cueing for sequencing;Sit to/from stand       Functional mobility during ADLs: Min guard;Rolling walker (2 wheels)      Extremity/Trunk Assessment Upper Extremity Assessment Upper Extremity Assessment: Overall WFL for tasks assessed   Lower Extremity Assessment Lower Extremity Assessment: Defer to PT evaluation;RLE deficits/detail        Vision       Perception  Praxis      Cognition Arousal/Alertness: Awake/alert Behavior During Therapy: WFL for tasks assessed/performed Overall Cognitive Status: Within Functional Limits for tasks assessed                                             Exercises     Shoulder Instructions       General Comments      Pertinent Vitals/ Pain       Pain Assessment: 0-10 Pain Score: 4  Breathing: normal Negative Vocalization: none Facial Expression: smiling or inexpressive Body Language: relaxed Consolability: no need to console PAINAD Score: 0 Pain Descriptors / Indicators: Aching;Discomfort;Grimacing;Guarding Pain Intervention(s): Limited activity within patient's tolerance;Repositioned  Home Living                                          Prior Functioning/Environment              Frequency  Min 2X/week        Progress Toward Goals  OT Goals(current goals can now be found in the care plan section)  Progress towards OT goals: Progressing toward goals  Acute Rehab OT Goals Patient Stated Goal: to be able to go home with [facility] OT Goal Formulation: With patient/family Time For Goal Achievement: 01/21/21 Potential to Achieve Goals: Good ADL Goals Pt Will Perform Upper Body Bathing: Independently;sitting Pt Will Perform Lower Body Bathing: with modified independence;sit to/from stand Pt Will Perform Lower Body Dressing: with min assist;sit to/from stand Pt Will Transfer to Toilet: with supervision;ambulating;bedside commode Pt Will Perform Toileting - Clothing Manipulation and hygiene: with min guard assist;sit to/from stand Pt Will Perform Tub/Shower Transfer: with supervision;ambulating;rolling walker;grab bars;shower seat Additional ADL Goal #1: Patient will stand at sink to perform grooming task as evidence of improving activity tolerance  Plan Discharge plan remains appropriate;Frequency remains appropriate    Co-evaluation                 AM-PAC OT "6 Clicks" Daily Activity     Outcome Measure   Help from another person eating meals?: None Help from another person taking care of personal grooming?: A Little Help from another person toileting, which includes using  toliet, bedpan, or urinal?: A Little Help from another person bathing (including washing, rinsing, drying)?: A Lot Help from another person to put on and taking off regular upper body clothing?: A Little Help from another person to put on and taking off regular lower body clothing?: A Lot 6 Click Score: 17    End of Session Equipment Utilized During Treatment: Rolling walker (2 wheels);Gait belt  OT Visit Diagnosis: Unsteadiness on feet (R26.81);Other abnormalities of gait and mobility (R26.89);Pain Pain - Right/Left: Right Pain - part of body: Knee   Activity Tolerance Patient limited by fatigue   Patient Left in chair;with call bell/phone within reach   Nurse Communication Mobility status        Time: 3833-3832 OT Time Calculation (min): 30 min  Charges: OT General Charges $OT Visit: 1 Visit OT Treatments $Self Care/Home Management : 23-37 mins  Joeseph Amor OTR/L  Acute Rehab Services  [phone] office number [phone] pager number   Joeseph Amor 01/12/2021, 9:47 AM

## 2021-01-12 NOTE — Progress Notes (Signed)
Patient ID: DESAREA OHAGAN, female   DOB: 1960-04-07, 60 y.o.   MRN: [id number] I did come by the bedside and see Alitzel this morning.  I remove the incisional VAC.  Her right knee incision looks really good.  There is no redness at all and no swelling.  The actual incision itself is dry.  I placed a really well-padded and thick dressing around her knee.  Her calf is soft as well.  I was able to flex and extend her knee gently.  I am encouraged that her sed rate and CRP are trending down and her white count remains normal.  This gives her the best chance at eradicating this infection hopefully.  I talked her in length in detail about staying positive mentally and that can go a long way with helping the body heal from a psychological standpoint as well.  I feel that she can go home from my standpoint tomorrow.  I absolutely appreciate [facility] hospitalists care of the patient as well.  I told Cadey that I would like to see her back in the office on Wednesday of next week and to leave the dressings on and in place until then unless there are issues.

## 2021-01-13 DIAGNOSIS — R0609 Other forms of dyspnea: Secondary | ICD-10-CM | POA: Diagnosis not present

## 2021-01-13 LAB — CBC WITH DIFFERENTIAL/PLATELET
Abs Immature Granulocytes: 0.09 10*3/uL — ABNORMAL HIGH (ref 0.00–0.07)
Basophils Absolute: 0 10*3/uL (ref 0.0–0.1)
Basophils Relative: 1 %
Eosinophils Absolute: 0.1 10*3/uL (ref 0.0–0.5)
Eosinophils Relative: 3 %
HCT: 32.9 % — ABNORMAL LOW (ref 36.0–46.0)
Hemoglobin: 9.9 g/dL — ABNORMAL LOW (ref 12.0–15.0)
Immature Granulocytes: 2 %
Lymphocytes Relative: 27 %
Lymphs Abs: 1.2 10*3/uL (ref 0.7–4.0)
MCH: 25.4 pg — ABNORMAL LOW (ref 26.0–34.0)
MCHC: 30.1 g/dL (ref 30.0–36.0)
MCV: 84.6 fL (ref 80.0–100.0)
Monocytes Absolute: 0.5 10*3/uL (ref 0.1–1.0)
Monocytes Relative: 11 %
Neutro Abs: 2.6 10*3/uL (ref 1.7–7.7)
Neutrophils Relative %: 56 %
Platelets: 286 10*3/uL (ref 150–400)
RBC: 3.89 MIL/uL (ref 3.87–5.11)
RDW: 19 % — ABNORMAL HIGH (ref 11.5–15.5)
WBC: 4.6 10*3/uL (ref 4.0–10.5)
nRBC: 0 % (ref 0.0–0.2)

## 2021-01-13 MED ORDER — FOLIC ACID 1 MG PO TABS: 1.0000 mg | ORAL_TABLET | Freq: Every day | ORAL | 3 refills | Status: DC

## 2021-01-13 MED ORDER — POLYETHYLENE GLYCOL 3350 17 G PO PACK: 17.0000 g | PACK | Freq: Two times a day (BID) | ORAL | 0 refills | Status: AC

## 2021-01-13 MED ORDER — FERROUS SULFATE 325 (65 FE) MG PO TABS
325.0000 mg | ORAL_TABLET | Freq: Every day | ORAL | 3 refills | Status: DC
Start: 2021-01-13 — End: 2022-02-14

## 2021-01-13 MED ORDER — ACETAMINOPHEN 325 MG PO TABS: 650.0000 mg | ORAL_TABLET | Freq: Four times a day (QID) | ORAL | Status: AC | PRN

## 2021-01-13 MED ORDER — HEPARIN SOD (PORK) LOCK FLUSH 100 UNIT/ML IV SOLN
250.0000 [IU] | INTRAVENOUS | Status: AC | PRN
  Administered 2021-01-13: 250 [IU]
  Filled 2021-01-13: qty 2.5

## 2021-01-13 MED ORDER — OXYCODONE HCL 5 MG PO TABS: 5.0000 mg | ORAL_TABLET | Freq: Four times a day (QID) | ORAL | 0 refills | Status: DC | PRN

## 2021-01-13 MED ORDER — DAPTOMYCIN IV (FOR PTA / DISCHARGE USE ONLY): 750.0000 mg | INTRAVENOUS | 0 refills | Status: AC

## 2021-01-13 NOTE — TOC Transition Note (Signed)
Transition of Care [facility]) - CM/SW Discharge Note   Patient Details  Name: AAREN KROG MRN: [id number] Date of Birth: [birth year]  [facility] CM/SW Contact:  Tom-Johnson, Renea Ee, RN Phone Number: 01/13/2021, 4:05 PM   Clinical Narrative:    Patient is scheduled for discharge today. Home IV antibiotics by [facility]. Home health PT/OT RN with [facility] as patient is currently active with them prior to hospitalization. Tub bench ordered from [employer], Freda Munro [phone]) to deliver at bedside. Husband at bedside to transport.     Final next level of care: [place] Barriers to Discharge: Barriers Resolved   Patient Goals and CMS Choice Patient states their goals for this hospitalization and ongoing recovery are:: To go home CMS Medicare.gov Compare Post Acute Care list provided to:: Patient Choice offered to / list presented to : Patient  Discharge Placement                       Discharge Plan and Services In-house Referral: Clinical Social Work              DME Arranged: Tub bench DME Agency: [employer] Date DME Agency Contacted: 01/13/21 Time DME Agency Contacted: 50 Representative spoke with at DME Agency: Freda Munro HH Arranged: RN, PT, OT [facility] Agency: [place]        Social Determinants of Health ([place]) Interventions     Readmission Risk Interventions No flowsheet data found.

## 2021-01-13 NOTE — Discharge Summary (Signed)
Physician Discharge Summary  [name] WJX:[id number] DOB: 09-03-1960 DOA: 01/03/2021  PCP: Mateo Flow, MD  Admit date: 01/03/2021 Discharge date: 01/13/2021  Time spent: 36 minutes  Recommendations for Outpatient Follow-up:  Needs daptomycin per [facility] instructions until 02/15/2021 and further follow-up with infectious disease specialist in the outpatient setting with regards to oral antibiotics-I have CCed them--- will probably need weekly CK levels as is on daptomycin Further follow-up with Dr. Jean Rosenthal orthopedics as per his instructions wound care and postoperative care as per him Please get CBC/Chem-12 in 1 week   Discharge Diagnoses:  MAIN problem for hospitalization  MRSA infection of right knee replacement  Please see below for itemized issues addressed in [place]- refer to other progress notes for clarity if needed  Discharge Condition: Improved  Diet recommendation: Heart healthy  Filed Weights   01/09/21 1616 01/11/21 2054 01/13/21 0503  Weight: (!) 138.5 kg (!) 140.9 kg (!) 137.1 kg    History of present illness:  60 year old with past medical history significant for morbid obesity, PAF, OSA, history of PE with recent total knee replacement on the right.  She underwent right total knee arthroplasty in December 23, 2128 complicated by A. fib RVR requiring prolonged hospitalization.   She has had significant pain and difficulty with mobility but did not want to go to rehab and was discharged home.   She presented due to worsening shortness of breath on exertion.  She also had episode of orthostatic hypotension at home.  He has been taking Xarelto. She has been evaluated by orthopedic, who is recommending IV antibiotics for mild knee cellulitis. Underwent a CT angio chest which was negative for PE, Doppler lower extremity negative for DVT.   Patient was found to have prostatic knee infection, with MRSA.  She underwent irrigation and debridement of  right knee and Poly exchange on 12/02.  ID was consulted patient will be discharged on daptomycin for 6 weeks, subsequently she will require 6 months of oral antibiotics.  Hb  has decreased to 7.8.  She will receive 2 units of packed red blood cell.    Hospital Course:  1-s/p total Right  knee replacement,  superimposed cellulitis right knee drainage,  MRSA infection status post TKA. -Underwent irrigation and debridement of right knee 12/02 -Wound VAC removed 12/8 patient examined by Ortho -She will need 6 weeks daptomycin IV till 02/15/2021 follow by 6 month oral treatment, will probably require weekly CK levels and follow-up with [facility] -Stabilizing for discharge per Ortho   2-Dyspnea on exertion, Tachypnea secondary likely to anemia of acute blood loss: CTA negative for PE.  Doppler lower extremity negative for DVT Echo Normal EF, LVF.  EKG: Sinus rhythm.Troponin negative. Suspect deconditioning. Oxygen sat normal on ambulation with OT>    3-Paroxysmal A. fib:  Continue with metoprolol.  EKG sinus rhythm. Xarelto initially was held 2/2 hematoma and then resumed   4-pulmonary embolism on chronic anticoagulation   Back on xarelto.  Defer to PCP when to discontinue   5-Anemia from acute blood loss; Started ferrous sulfate.  Hd down to 8.9--8.2.--7.8 transfuse 2 unit PRBC> 12/06, acceptable rise in hemoglobin up to 9.9 on day of discharge    6-Constipation; started on Miralax, and senna.    Estimated body mass index is 50.14 kg/m as calculated from the following:   Height as of 12/23/20: 5' 6"  (1.676 m).   Weight as of this encounter: 140.9 kg.  Procedures: Irrigation debridement right knee with sharp excisional debridement  necrotic fascia with polyliner exchange right knee  Consultations: Orthopedics Infectious disease  Discharge Exam: Vitals:   01/12/21 2038 01/13/21 0503  BP: (!) 131/56 120/68  Pulse: 76 76  Resp: 18 18  Temp: 98.4 F (36.9 C) 98 F (36.7 C)   SpO2: 96% 93%    Subj on day of d/c   Awake coherent no distress EOMI NCAT no focal deficit neck soft supple She tells me she is doing well pain is moderately controlled not really requiring narcotics except occasionally at night  General Exam on discharge  EOMI NCAT no focal deficit CTA B no added sound no rales rhonchi S1-S2 no murmur Abdomen soft no rebound no guarding ROM intact Right lower extremity has Coban dressing and is wrapped Neurologically intact  Discharge Instructions   Discharge Instructions     [facility] pharmacist to adjust dose for Vancomycin, Aminoglycosides and other anti-infective therapies as requested by physician.   Complete by: As directed    [facility] to provide Cath Flo 21m   Complete by: As directed    Administer for PICC line occlusion and as ordered by physician for other access device issues.   Anaphylaxis Kit: Provided to treat any anaphylactic reaction to the medication being provided to the patient if First Dose or when requested by physician   Complete by: As directed    Epinephrine 168mml vial / amp: Administer 0.65m56m0.65ml65mubcutaneously once for moderate to severe anaphylaxis, nurse to call physician and pharmacy when reaction occurs and call 911 if needed for immediate care   Diphenhydramine 50mg75mIV vial: Administer 25-50mg 79mM PRN for first dose reaction, rash, itching, mild reaction, nurse to call physician and pharmacy when reaction occurs   Sodium Chloride 0.9% NS 500ml I66mdminister if needed for hypovolemic blood pressure drop or as ordered by physician after call to physician with anaphylactic reaction   Change dressing on IV access line weekly and PRN   Complete by: As directed    Diet - low sodium heart healthy   Complete by: As directed    Discharge instructions   Complete by: As directed    Please follow-up with orthopedics in the outpatient setting with regards to your knee--please follow  their instructions You will need periodic labs as per infectious disease because of antibiotic 1 We will call in some medication for you for your pain control New medications would include iron to be taken twice a day as you had experienced some bleeding previously   Flush IV access with Sodium Chloride 0.9% and Heparin 10 units/ml or 100 units/ml   Complete by: As directed    Home infusion instructions - [facility]   Complete by: As directed    Instructions: Flush IV access with Sodium Chloride 0.9% and Heparin 10units/ml or 100units/ml   Change dressing on IV access line: Weekly and PRN   Instructions Cath Flo 2mg: Ad26mister for PICC Line occlusion and as ordered by physician for other access device   [facility] pharmacist to adjust dose for: Vancomycin, Aminoglycosides and other anti-infective therapies as requested by physician   Increase activity slowly   Complete by: As directed    Method of administration may be changed at the discretion of home infusion pharmacist based upon assessment of the patient and/or caregiver's ability to self-administer the medication ordered   Complete by: As directed    No wound care   Complete by: As directed    Outpatient Parenteral Antibiotic  Therapy Information Antibiotic: Daptomycin (Cubicin) IVPB; Indications for use: prosthetic knee infection; End Date: 02/18/2021   Complete by: As directed    Antibiotic: Daptomycin (Cubicin) IVPB   Indications for use: prosthetic knee infection   End Date: 02/18/2021      Allergies as of 01/13/2021       Reactions   Contrast Media [iodinated Diagnostic Agents] Shortness Of Breath, Other (See Comments)   APNEA   Ancef [cefazolin Sodium] Nausea And Vomiting   Tolerates IV        Medication List     STOP taking these medications    hydroxychloroquine 200 MG tablet Commonly known as: PLAQUENIL   nitrofurantoin (macrocrystal-monohydrate) 100 MG capsule Commonly known as:  MACROBID       TAKE these medications    acetaminophen 325 MG tablet Commonly known as: TYLENOL Take 2 tablets (650 mg total) by mouth every 6 (six) hours as needed for mild pain (or Fever >/= 101).   daptomycin  IVPB Commonly known as: CUBICIN Inject 750 mg into the vein daily. Indication:  Knee prosthetic knee infection First Dose: No Last Day of Therapy:  02/18/21 Labs - Once weekly:  CBC/D, BMP, and CPK Labs - Every other week:  ESR and CRP Method of administration: IV Push Method of administration may be changed at the discretion of home infusion pharmacist based upon assessment of the patient and/or caregiver's ability to self-administer the medication ordered.   ferrous sulfate 325 (65 FE) MG tablet Take 1 tablet (325 mg total) by mouth daily with breakfast.   folic acid 1 MG tablet Commonly known as: FOLVITE Take 1 tablet (1 mg total) by mouth daily.   methocarbamol 500 MG tablet Commonly known as: ROBAXIN Take 1 tablet (500 mg total) by mouth every 6 (six) hours as needed for muscle spasms.   metoprolol succinate 50 MG 24 hr tablet Commonly known as: TOPROL-XL Take 1 tablet (50 mg total) by mouth daily. Take with or immediately following a meal. What changed: Another medication with the same name was removed. Continue taking this medication, and follow the directions you see here.   multivitamin tablet Take 1 tablet by mouth daily.   omeprazole 20 MG capsule Commonly known as: PRILOSEC Take 20 mg by mouth 2 (two) times daily.   oxyCODONE 5 MG immediate release tablet Commonly known as: Oxy IR/ROXICODONE Take 1-2 tablets (5-10 mg total) by mouth every 6 (six) hours as needed for moderate pain (pain score 4-6). What changed: when to take this   polyethylene glycol 17 g packet Commonly known as: MIRALAX / GLYCOLAX Take 17 g by mouth 2 (two) times daily.   rivaroxaban 20 MG Tabs tablet Commonly known as: XARELTO Take 20 mg by mouth daily.   tolterodine 2  MG 24 hr capsule Commonly known as: DETROL LA Take 2 mg by mouth daily.               Discharge Care Instructions  (From admission, onward)           Start     Ordered   01/13/21 0000  Change dressing on IV access line weekly and PRN  (Home infusion instructions - [facility] )        01/13/21 0859           Allergies  Allergen Reactions   Contrast Media [Iodinated Diagnostic Agents] Shortness Of Breath and Other (See Comments)    APNEA   Ancef [Cefazolin Sodium] Nausea And Vomiting  Tolerates IV    Follow-up Information     [name], MD. Schedule an appointment as soon as possible for a visit in 7 day(s).   Specialty: Orthopedic Surgery Contact information: [place] [address] [phone]                  The results of significant diagnostics from this hospitalization (including imaging, microbiology, ancillary and laboratory) are listed below for reference.    Significant Diagnostic Studies: DG Chest 2 View  Result Date: 01/03/2021 CLINICAL DATA:  Shortness of breath.  Recent surgery. EXAM: CHEST - 2 VIEW COMPARISON:  08/30/2016 FINDINGS: The heart size and mediastinal contours are within normal limits. Both lungs are clear. The visualized skeletal structures are unremarkable. IMPRESSION: No active cardiopulmonary disease. Electronically Signed   By: Kerby Moors M.D.   On: 01/03/2021 13:59   CT Angio Chest PE W and/or Wo Contrast  Result Date: 01/04/2021 CLINICAL DATA:  Concern for pulmonary embolus EXAM: CT ANGIOGRAPHY CHEST WITH CONTRAST TECHNIQUE: Multidetector CT imaging of the chest was performed using the standard protocol during bolus administration of intravenous contrast. Multiplanar CT image reconstructions and MIPs were obtained to evaluate the vascular anatomy. CONTRAST:  132m OMNIPAQUE IOHEXOL 350 MG/ML SOLN COMPARISON:  CT chest angio dated August 31, 2016 FINDINGS: Cardiovascular:  Somewhat suboptimal contrast opacification of the pulmonary arteries. Within limitations, there is no evidence of pulmonary embolus to the level of the segmental arteries. Normal heart size. No pericardial effusion. No significant coronary artery calcifications or significant atherosclerotic disease of thoracic aorta. Mediastinum/Nodes: Small hiatal hernia. Thyroid is unremarkable. No pathologically enlarged lymph nodes seen in the chest. Lungs/Pleura: Central airways are patent. Mild bilateral air trapping. Mild diffuse ground-glass opacities, compatible with atelectasis related to expiratory phase of imaging. No focal consolidation. No pneumothorax. Upper Abdomen: Cholecystectomy clips. Gastric band noted. No acute abnormality. Musculoskeletal: No chest wall abnormality. No acute or significant osseous findings. Review of the MIP images confirms the above findings. IMPRESSION: 1. No evidence of pulmonary embolus to the level of the segmental arteries. 2. Mild bilateral air trapping, findings can be seen in the setting of small airways disease. 3. No acute intrathoracic process. Electronically Signed   By: LYetta GlassmanM.D.   On: 01/04/2021 12:34   DG Knee Right Port  Result Date: 12/23/2020 CLINICAL DATA:  Right total knee replacement. EXAM: PORTABLE RIGHT KNEE - 1-2 VIEW COMPARISON:  Right knee x-rays dated February 26, 2019. FINDINGS: The right knee demonstrates a total knee arthroplasty without evidence of hardware failure or complication. There is expected intra-articular air. There is no fracture or dislocation. The alignment is anatomic. Post-surgical changes noted in the surrounding soft tissues. IMPRESSION: 1. Right total knee arthroplasty without evidence of acute postoperative complication. Electronically Signed   By: WTitus DubinM.D.   On: 12/23/2020 14:56   ECHOCARDIOGRAM COMPLETE  Result Date: 01/04/2021    ECHOCARDIOGRAM REPORT   Patient Name:   SSHAKEEMA LIPPMANDate of Exam:  01/04/2021 Medical Rec #:  [id number]        Height:       66.0 in Accession #:    [id number]       Weight:       314.4 lb Date of Birth:  3Aug 19, 1962         BSA:          2.424 m Patient Age:    89years  BP:           133/61 mmHg Patient Gender: F                 HR:           65 bpm. Exam Location:  Inpatient Procedure: 2D Echo, Cardiac Doppler and Color Doppler Indications:    Dyspnea  History:        Patient has prior history of Echocardiogram examinations, most                 recent 07/21/2018. CHF, Arrythmias:Atrial Fibrillation,                 Signs/Symptoms:Shortness of Breath; Risk Factors:Dyslipidemia.  Sonographer:    Bernadene Person RDCS Referring Phys: [id number] [place]  1. Left ventricular ejection fraction, by estimation, is 60 to 65%. The left ventricle has normal function. The left ventricle has no regional wall motion abnormalities. Left ventricular diastolic parameters were normal.  2. Right ventricular systolic function is normal. The right ventricular size is normal. There is normal pulmonary artery systolic pressure.  3. Left atrial size was mildly dilated.  4. The mitral valve is normal in structure. No evidence of mitral valve regurgitation. No evidence of mitral stenosis.  5. The aortic valve is tricuspid. Aortic valve regurgitation is not visualized. Aortic valve sclerosis is present, with no evidence of aortic valve stenosis.  6. The inferior vena cava is normal in size with greater than 50% respiratory variability, suggesting right atrial pressure of 3 mmHg. Comparison(s): A prior study was performed on 07/21/2018. FINDINGS  Left Ventricle: Left ventricular ejection fraction, by estimation, is 60 to 65%. The left ventricle has normal function. The left ventricle has no regional wall motion abnormalities. The left ventricular internal cavity size was normal in size. There is  no left ventricular hypertrophy. Left ventricular diastolic parameters were normal.  Right Ventricle: The right ventricular size is normal. No increase in right ventricular wall thickness. Right ventricular systolic function is normal. There is normal pulmonary artery systolic pressure. The tricuspid regurgitant velocity is 2.08 m/s, and  with an assumed right atrial pressure of 15 mmHg, the estimated right ventricular systolic pressure is 45.3 mmHg. Left Atrium: Left atrial size was mildly dilated. Right Atrium: Right atrial size was normal in size. Pericardium: There is no evidence of pericardial effusion. Mitral Valve: The mitral valve is normal in structure. Mild mitral annular calcification. No evidence of mitral valve regurgitation. No evidence of mitral valve stenosis. Tricuspid Valve: The tricuspid valve is normal in structure. Tricuspid valve regurgitation is not demonstrated. No evidence of tricuspid stenosis. Aortic Valve: The aortic valve is tricuspid. Aortic valve regurgitation is not visualized. Aortic valve sclerosis is present, with no evidence of aortic valve stenosis. Pulmonic Valve: The pulmonic valve was not well visualized. Pulmonic valve regurgitation is not visualized. No evidence of pulmonic stenosis. Aorta: The aortic root is normal in size and structure. Venous: The inferior vena cava is normal in size with greater than 50% respiratory variability, suggesting right atrial pressure of 3 mmHg. IAS/Shunts: No atrial level shunt detected by color flow Doppler.  LEFT VENTRICLE PLAX 2D LVIDd:         5.00 cm      Diastology LVIDs:         3.50 cm      LV e' medial:    8.92 cm/s LV PW:         1.10 cm  LV E/e' medial:  8.3 LV IVS:        0.80 cm      LV e' lateral:   10.80 cm/s LVOT diam:     2.30 cm      LV E/e' lateral: 6.9 LV SV:         108 LV SV Index:   45 LVOT Area:     4.15 cm  LV Volumes (MOD) LV vol d, MOD A2C: 150.0 ml LV vol d, MOD A4C: 168.0 ml LV vol s, MOD A2C: 73.0 ml LV vol s, MOD A4C: 83.0 ml LV SV MOD A2C:     77.0 ml LV SV MOD A4C:     168.0 ml LV SV MOD  BP:      81.1 ml RIGHT VENTRICLE RV S prime:     14.90 cm/s TAPSE (M-mode): 2.2 cm LEFT ATRIUM             Index        RIGHT ATRIUM           Index LA diam:        4.40 cm 1.82 cm/m   RA Area:     19.90 cm LA Vol (A2C):   74.4 ml 30.70 ml/m  RA Volume:   54.30 ml  22.40 ml/m LA Vol (A4C):   84.0 ml 34.66 ml/m LA Biplane Vol: 85.6 ml 35.32 ml/m  AORTIC VALVE LVOT Vmax:   113.00 cm/s LVOT Vmean:  72.700 cm/s LVOT VTI:    0.260 m  AORTA Ao Root diam: 3.60 cm Ao Asc diam:  3.50 cm MITRAL VALVE               TRICUSPID VALVE MV Area (PHT): 1.74 cm    TR Peak grad:   17.3 mmHg MV Decel Time: 436 msec    TR Vmax:        208.00 cm/s MV E velocity: 74.10 cm/s MV A velocity: 45.80 cm/s  SHUNTS MV E/A ratio:  1.62        Systemic VTI:  0.26 m                            Systemic Diam: 2.30 cm Kardie Tobb DO Electronically signed by Berniece Salines DO Signature Date/Time: 01/04/2021/6:37:57 PM    Final    VAS [place] LOWER EXTREMITY VENOUS (DVT) (7a-7p)  Result Date: 01/03/2021  Lower Venous DVT Study Patient Name:  AVILA ALBRITTON  Date of Exam:   01/03/2021 Medical Rec #: [id number]          Accession #:    [id number] Date of Birth: 1960/04/20           Patient Gender: F Patient Age:   56 years Exam Location:  [facility] Procedure:      VAS [place] LOWER EXTREMITY VENOUS (DVT) Referring Phys: RILEY RANSOM --------------------------------------------------------------------------------  Indications: Pain, Swelling, and Edema.  Limitations: Poor ultrasound/tissue interface and body habitus. Comparison Study: 08/07/20 prior Performing Technologist: Archie Patten RVS  Examination Guidelines: A complete evaluation includes B-mode imaging, spectral Doppler, color Doppler, and power Doppler as needed of all accessible portions of each vessel. Bilateral testing is considered an integral part of a complete examination. Limited examinations for reoccurring indications may be performed as noted. The reflux portion of the exam is  performed with the patient in reverse Trendelenburg.  +---------+---------------+---------+-----------+----------+-------------------+ RIGHT    CompressibilityPhasicitySpontaneityPropertiesThrombus Aging      +---------+---------------+---------+-----------+----------+-------------------+  CFV      Full           Yes      Yes                                      +---------+---------------+---------+-----------+----------+-------------------+ SFJ      Full                                                             +---------+---------------+---------+-----------+----------+-------------------+ FV Prox  Full                                                             +---------+---------------+---------+-----------+----------+-------------------+ FV Mid                  Yes      Yes                                      +---------+---------------+---------+-----------+----------+-------------------+ FV Distal               Yes      Yes                                      +---------+---------------+---------+-----------+----------+-------------------+ PFV      Full                                                             +---------+---------------+---------+-----------+----------+-------------------+ POP      Full           Yes      Yes                                      +---------+---------------+---------+-----------+----------+-------------------+ PTV      Full                                                             +---------+---------------+---------+-----------+----------+-------------------+ PERO                                                  Not well visualized +---------+---------------+---------+-----------+----------+-------------------+   +----+---------------+---------+-----------+----------+--------------+ LEFTCompressibilityPhasicitySpontaneityPropertiesThrombus Aging  +----+---------------+---------+-----------+----------+--------------+ CFV Full           Yes      Yes                                 +----+---------------+---------+-----------+----------+--------------+  Summary: RIGHT: - There is no evidence of deep vein thrombosis in the lower extremity. However, portions of this examination were limited- see technologist comments above.  - No cystic structure found in the popliteal fossa.  LEFT: - No evidence of common femoral vein obstruction.  *See table(s) above for measurements and observations. Electronically signed by Harold Barban MD on 01/03/2021 at 7:02:43 PM.    Final    [place] EKG SITE RITE  Result Date: 01/07/2021 If Site Rite image not attached, placement could not be confirmed due to current cardiac rhythm.   Microbiology: Recent Results (from the past 240 hour(s))  Resp Panel by RT-PCR (Flu A&B, Covid) Nasopharyngeal Swab     Status: None   Collection Time: 01/03/21  4:10 PM   Specimen: Nasopharyngeal Swab; Nasopharyngeal(NP) swabs in vial transport medium  Result Value Ref Range Status   SARS Coronavirus 2 by RT PCR NEGATIVE NEGATIVE Final    Comment: (NOTE) SARS-CoV-2 target nucleic acids are NOT DETECTED.  The SARS-CoV-2 RNA is generally detectable in upper respiratory specimens during the acute phase of infection. The lowest concentration of SARS-CoV-2 viral copies this assay can detect is 138 copies/mL. A negative result does not preclude SARS-Cov-2 infection and should not be used as the sole basis for treatment or other patient management decisions. A negative result may occur with  improper specimen collection/handling, submission of specimen other than nasopharyngeal swab, presence of viral mutation(s) within the areas targeted by this assay, and inadequate number of viral copies(<138 copies/mL). A negative result must be combined with clinical observations, patient history, and epidemiological information. The expected  result is Negative.  Fact Sheet for Patients:  EntrepreneurPulse.com.au  Fact Sheet for Healthcare Providers:  IncredibleEmployment.be  This test is no t yet approved or cleared by the [place] FDA and  has been authorized for detection and/or diagnosis of SARS-CoV-2 by FDA under an Emergency Use Authorization (EUA). This EUA will remain  in effect (meaning this test can be used) for the duration of the COVID-19 declaration under Section 564(b)(1) of the Act, 21 U.S.C.section 360bbb-3(b)(1), unless the authorization is terminated  or revoked sooner.       Influenza A by PCR NEGATIVE NEGATIVE Final   Influenza B by PCR NEGATIVE NEGATIVE Final    Comment: (NOTE) The Xpert Xpress SARS-CoV-2/FLU/RSV plus assay is intended as an aid in the diagnosis of influenza from Nasopharyngeal swab specimens and should not be used as a sole basis for treatment. Nasal washings and aspirates are unacceptable for Xpert Xpress SARS-CoV-2/FLU/RSV testing.  Fact Sheet for Patients: EntrepreneurPulse.com.au  Fact Sheet for Healthcare Providers: IncredibleEmployment.be  This test is not yet approved or cleared by the [place] FDA and has been authorized for detection and/or diagnosis of SARS-CoV-2 by FDA under an Emergency Use Authorization (EUA). This EUA will remain in effect (meaning this test can be used) for the duration of the COVID-19 declaration under Section 564(b)(1) of the Act, 21 U.S.C. section 360bbb-3(b)(1), unless the authorization is terminated or revoked.  Performed at [facility], [place] [address], [place], [place] [address]   Urine Culture     Status: Abnormal   Collection Time: 01/03/21  6:19 PM   Specimen: Urine, Clean Catch  Result Value Ref Range Status   Specimen Description URINE, CLEAN CATCH  Final   Special Requests   Final    NONE Performed at [facility], [place] [address], [place], [place] [address]    Culture MULTIPLE  SPECIES PRESENT, SUGGEST RECOLLECTION (A)  Final   Report Status 01/04/2021 FINAL  Final  Aerobic/Anaerobic Culture w Gram Stain (surgical/deep wound)     Status: None   Collection Time: 01/06/21  4:35 PM   Specimen: PATH Cytology Misc. fluid; Body Fluid  Result Value Ref Range Status   Specimen Description FLUID RIGHT KNEE  Final   Special Requests SAMPLE A  Final   Gram Stain   Final    FEW WBC PRESENT, PREDOMINANTLY MONONUCLEAR RARE GRAM POSITIVE COCCI Performed at [facility], [place] [address] [place], [place] [address]    Culture   Final    FEW STAPHYLOCOCCUS AUREUS SUSCEPTIBILITIES PERFORMED ON PREVIOUS CULTURE WITHIN THE LAST 5 DAYS. NO ANAEROBES ISOLATED; CULTURE IN PROGRESS FOR 5 DAYS    Report Status 01/12/2021 FINAL  Final  Aerobic/Anaerobic Culture w Gram Stain (surgical/deep wound)     Status: None   Collection Time: 01/06/21  4:38 PM   Specimen: PATH Cytology Misc. fluid; Body Fluid  Result Value Ref Range Status   Specimen Description JOINT FLUID RIGHT KNEE  Final   Special Requests SAMPLE A  Final   Gram Stain   Final    MODERATE WBC PRESENT, PREDOMINANTLY MONONUCLEAR FEW GRAM POSITIVE COCCI    Culture   Final    MODERATE METHICILLIN RESISTANT STAPHYLOCOCCUS AUREUS NO ANAEROBES ISOLATED Performed at [facility], [place] [address] [place], [place] [address]    Report Status 01/12/2021 FINAL  Final   Organism ID, Bacteria METHICILLIN RESISTANT STAPHYLOCOCCUS AUREUS  Final      Susceptibility   Methicillin resistant staphylococcus aureus - MIC*    CIPROFLOXACIN >=8 RESISTANT Resistant     ERYTHROMYCIN >=8 RESISTANT Resistant     GENTAMICIN <=0.5 SENSITIVE Sensitive     OXACILLIN >=4 RESISTANT Resistant     TETRACYCLINE >=16 RESISTANT Resistant     VANCOMYCIN <=0.5 SENSITIVE Sensitive     TRIMETH/SULFA <=10 SENSITIVE Sensitive     CLINDAMYCIN >=8 RESISTANT Resistant     RIFAMPIN <=0.5 SENSITIVE  Sensitive     Inducible Clindamycin NEGATIVE Sensitive     * MODERATE METHICILLIN RESISTANT STAPHYLOCOCCUS AUREUS     Labs: Basic Metabolic Panel: Recent Labs  Lab 01/08/21 0313 01/09/21 1517 01/10/21 0410 01/12/21 0417  NA 138 134* 137 134*  K 4.1 3.8 3.8 3.9  CL 107 105 104 102  CO2 26 24 26 25   GLUCOSE 83 106* 91 97  BUN 10 9 6* 6*  CREATININE 0.55 0.48 0.48 0.55  CALCIUM 8.0* 8.2* 8.2* 8.4*   Liver Function Tests: Recent Labs  Lab 01/12/21 0417  AST 30  ALT 20  ALKPHOS 66  BILITOT 0.6  PROT 6.0*  ALBUMIN 2.3*   No results for input(s): LIPASE, AMYLASE in the last 168 hours. No results for input(s): AMMONIA in the last 168 hours. CBC: Recent Labs  Lab 01/09/21 1517 01/10/21 0410 01/11/21 0442 01/12/21 0417 01/13/21 0438  WBC 7.6 6.6 8.2 5.7 4.6  NEUTROABS  --   --   --  3.2 2.6  HGB 8.6* 7.8* 9.7* 10.0* 9.9*  HCT 28.7* 27.2* 32.4* 33.1* 32.9*  MCV 82.5 83.2 82.7 83.2 84.6  PLT 320 302 296 306 286   Cardiac Enzymes: Recent Labs  Lab 01/10/21 0410  CKTOTAL 54   BNP: BNP (last 3 results) Recent Labs    01/03/21 1012  BNP 61.3    ProBNP (last 3 results) No results for input(s): PROBNP in the last 8760  hours.  CBG: No results for input(s): GLUCAP in the last 168 hours.     Signed:  Nita Sells MD   [facility] 01/13/2021, 9:01 AM

## 2021-01-13 NOTE — Progress Notes (Incomplete)
DISCHARGE NOTE HOME [name] to be discharged Home per MD order. Discussed prescriptions and follow up appointments with the patient. Prescriptions given to patient; medication list explained in detail. Patient verbalized understanding.  Skin clean, dry and intact without evidence of skin break down, no evidence of skin tears noted. Home with PICC Line was capped and heparin locked by IV team  Patient free of other  lines, drains, and wounds.   An After Visit Summary (AVS) was printed and given to the patient. Patient escorted via wheelchair, and discharged home via private auto.  Berneta Levins, RN

## 2021-01-13 NOTE — Progress Notes (Signed)
OT Cancellation Note  Patient Details Name: [name] MRN: [id number] DOB: 03/20/1960   Cancelled Treatment:    Reason Eval/Treat Not Completed: Other (comment) (pt. was eating lunch and nursing staff asked we could try back later)  Will attempt back as able.    Daiva Huge Lorraine-COTA/L 01/13/2021, 12:41 PM

## 2021-01-17 ENCOUNTER — Telehealth: Payer: Self-pay

## 2021-01-17 NOTE — Telephone Encounter (Signed)
FYI---Jerry just wanted to let you know they are back out there for PT, states she can go about 30 feet and she has to pause, sit and rest. He states they may keep HHPT for a bit longer if her insurance will allow?

## 2021-01-18 ENCOUNTER — Other Ambulatory Visit: Payer: Self-pay | Admitting: Orthopaedic Surgery

## 2021-01-18 ENCOUNTER — Ambulatory Visit (INDEPENDENT_AMBULATORY_CARE_PROVIDER_SITE_OTHER): Payer: No Typology Code available for payment source | Admitting: Orthopaedic Surgery

## 2021-01-18 ENCOUNTER — Encounter: Payer: Self-pay | Admitting: Orthopaedic Surgery

## 2021-01-18 DIAGNOSIS — T8453XD Infection and inflammatory reaction due to internal right knee prosthesis, subsequent encounter: Secondary | ICD-10-CM

## 2021-01-18 DIAGNOSIS — Z96651 Presence of right artificial knee joint: Secondary | ICD-10-CM

## 2021-01-18 MED ORDER — ONDANSETRON 4 MG PO TBDP: 4.0000 mg | ORAL_TABLET | Freq: Three times a day (TID) | ORAL | 3 refills | Status: DC | PRN

## 2021-01-18 MED ORDER — ONDANSETRON 4 MG PO TBDP: ORAL_TABLET | ORAL | 3 refills | Status: DC

## 2021-01-18 NOTE — Progress Notes (Signed)
[name] is under 2 weeks status post an irrigation debridement of an infected right total knee arthroplasty.  She was only at 2 weeks postoperative and we took her to the operating room to washout her knee.  We performed a polyliner exchange and synovectomy as well as placement of vancomycin powder.  She has a PICC line and she is on IV antibiotics now and is followed by infectious disease.  Unfortunately her cultures did grow out MRSA.  She understands that that will be difficult to eradicate but hopefully we can since this is an infection that we got on quickly.  She did not have any positive swabs for MRSA from her nasal swabs prior to surgery.  She has a successful left total knee that we did in 2018.  She is somewhat a BMI of 48.  She is not a diabetic but she is on Xarelto due to chronic A. fib.  Postoperatively she was in the hospital for a longer period of time after the initial surgery due to A. fib with RVR.  Incision looks good today.  There is no cellulitis or redness.  It is weeping at the bottom aspect of the incision.  This is the area where internally we had a hard time closing the tissue and there was certainly adipose tissue as well.  I did place a small Prevena incisional VAC over the inferior aspect of the incision to hopefully help this dry up.  She will follow-up next week to have this removed and the sutures removed and Steri-Strips applied.  I will send in some Zofran for her nausea as well.

## 2021-01-19 ENCOUNTER — Other Ambulatory Visit: Payer: Self-pay | Admitting: Orthopaedic Surgery

## 2021-01-19 MED ORDER — ONDANSETRON HCL 4 MG PO TABS: 4.0000 mg | ORAL_TABLET | Freq: Three times a day (TID) | ORAL | 0 refills | Status: DC | PRN

## 2021-01-19 MED ORDER — PROMETHAZINE HCL 12.5 MG PO TABS: 12.5000 mg | ORAL_TABLET | Freq: Four times a day (QID) | ORAL | 0 refills | Status: DC | PRN

## 2021-01-20 ENCOUNTER — Ambulatory Visit (HOSPITAL_BASED_OUTPATIENT_CLINIC_OR_DEPARTMENT_OTHER): Payer: No Typology Code available for payment source | Admitting: Family

## 2021-01-22 ENCOUNTER — Other Ambulatory Visit: Payer: Self-pay | Admitting: Orthopaedic Surgery

## 2021-01-23 ENCOUNTER — Telehealth: Payer: Self-pay | Admitting: Radiology

## 2021-01-23 NOTE — Telephone Encounter (Signed)
Patient and husband called triage line stating wound vac has been beeping and the canister seems full. Patient is unable to come to the office today per her husband and they would like to know if they can remove the vac. Patient states that home health is not doing anything with the vac and that we applied it here in the office.  After speaking with Caryl Pina, advised patient that she can remove wound vac and redress with dry dressing. Patient expressed understanding and will call back if she has additional questions.

## 2021-01-26 ENCOUNTER — Other Ambulatory Visit: Payer: Self-pay

## 2021-01-26 ENCOUNTER — Ambulatory Visit (INDEPENDENT_AMBULATORY_CARE_PROVIDER_SITE_OTHER): Payer: No Typology Code available for payment source | Admitting: Physician Assistant

## 2021-01-26 ENCOUNTER — Encounter: Payer: Self-pay | Admitting: Physician Assistant

## 2021-01-26 DIAGNOSIS — Z96651 Presence of right artificial knee joint: Secondary | ICD-10-CM

## 2021-01-26 DIAGNOSIS — T8453XD Infection and inflammatory reaction due to internal right knee prosthesis, subsequent encounter: Secondary | ICD-10-CM

## 2021-01-26 NOTE — Progress Notes (Signed)
HPI: [name] returns today for follow-up of her right knee.  She is now 3 weeks status post irrigation debridement infected right total knee arthroplasty.  She is on a PICC line and receiving IV antibiotics.  This is causing nausea.  She is also unfortunately had bowel impaction secondary to narcotics and is now taking no pain medicines.  She is taking just Tylenol for pain.  She is on Zofran for the nausea.  She has had no fevers.  She is here mainly for wound check today.  She did remove her incisional VAC over the weekend.  Physical exam: Right knee surgical incisions well-healed there is no drainage or expressible purulence.  There is small amount of erythema but no abnormal warmth distal incision.  Calf supple.  She has full extension and flexion to around 90 degrees.  Calf supple.  Impression: Infected right total knee arthroplasty   Plan: She will continue her IV antibiotics.  She has an upcoming appointment with infectious disease on January 4.  Sutures removed today.  Steri-Strips applied.  See her back in just 1 week sooner if she develops any drainage or increased erythema or warmth.  Questions were encouraged and answered.

## 2021-01-26 NOTE — Progress Notes (Deleted)
Office Visit Note  Patient: [name]             Date of Birth: July 13, 1960           MRN: [id number]             PCP: Mateo Flow, MD Referring: Mateo Flow, MD Visit Date: 02/09/2021 Occupation: @GUAROCC @  Subjective:  No chief complaint on file.   History of Present Illness: [name] is a 60 y.o. female ***   Activities of Daily Living:  Patient reports morning stiffness for *** {minute/hour:19697}.   Patient {ACTIONS;DENIES/REPORTS:21021675::"Denies"} nocturnal pain.  Difficulty dressing/grooming: {ACTIONS;DENIES/REPORTS:21021675::"Denies"} Difficulty climbing stairs: {ACTIONS;DENIES/REPORTS:21021675::"Denies"} Difficulty getting out of chair: {ACTIONS;DENIES/REPORTS:21021675::"Denies"} Difficulty using hands for taps, buttons, cutlery, and/or writing: {ACTIONS;DENIES/REPORTS:21021675::"Denies"}  No Rheumatology ROS completed.   PMFS History:  Patient Active Problem List   Diagnosis Date Noted   Tachypnea 01/03/2021   Cellulitis of right knee 01/03/2021   Chronic anticoagulation 01/03/2021   Status post total right knee replacement 12/23/2020   Unilateral primary osteoarthritis, right knee 04/07/2020   Leg edema 08/19/2018   Presence of left artificial knee joint 08/14/2016   Unilateral primary osteoarthritis, left knee 07/31/2016   Status post total knee replacement, left 07/31/2016   Abdominal hematoma    Post-operative state    Paroxysmal atrial fibrillation ([place])    Controlled type 2 diabetes mellitus with diabetic nephropathy ([place])    Wound infection 01/25/2016   Hydronephrosis, left 01/25/2016   Hydronephrosis, right 01/25/2016   DVT (deep venous thrombosis) ([place]) 01/25/2016   Atrial fibrillation ([place]) 01/25/2016   Diabetes mellitus with complication ([place])    Wound infection after surgery, initial encounter    Varicose veins of lower extremities with complications 40/98/1191   Pain and swelling of left lower leg 10/27/2013   Pain of  left lower extremity 10/27/2013   Gastroenteritis, acute 09/08/2013   Fever, unspecified 09/04/2013   ASD (atrial septal defect) 08/13/2013   Pulmonary embolism ([place]) 06/22/2013   Dyspnea 06/11/2013   Anterior slip repaired Oct 2012 01/18/2011   GI bleed 12/18/2010   Diabetes mellitus ([place]) 12/18/2010   Varicose veins of lower extremities with other complications 47/82/9562   Diabetes mellitus    HIP PAIN, RIGHT 03/06/2010    Past Medical History:  Diagnosis Date   Anemia    Arthritis    Atrial fibrillation ([place])    Bleeding ulcer 12-2010  hospitalized for 1 week   Blood transfusion    Blood transfusion without reported diagnosis    2011-2 units transfused, bleeding ulcer   Bruises easily    CHF (congestive heart failure) ([place])    with PE resolved after    Complication of anesthesia    Constipation    Difficulty in swallowing 03/24/2013   no longer a problem   Diverticulitis    DVT (deep venous thrombosis) ([place]) 06/2013   right leg   Dysrhythmia     Hx of A fib    GERD (gastroesophageal reflux disease)    Hyperlipidemia    Leg swelling 03/24/2013   not a problem any longer   Osteoarthritis    Peripheral vascular disease ([facility])    Pre-diabetes    Pulmonary embolus ([place]) 06/2013   Reflux    Shortness of breath    with PE   Vomiting 03/24/2013   problem resolved after lap band revision    Family History  Problem Relation Age of Onset   Heart disease Father    Kidney disease  Father    Peripheral vascular disease Father    Heart attack Father        18   Diabetes Mother    Arthritis Mother    Past Surgical History:  Procedure Laterality Date   APPENDECTOMY     CARPEL TUNNEL Alpha     1'82   COLONOSCOPY N/A 04/10/2013   Procedure: COLONOSCOPY;  Surgeon: Beryle Beams, MD;  Location: [facility];  Service: Endoscopy;  Laterality: N/A;   DENTAL SURGERY  10/2019   tooth implant   DIAGNOSTIC LAPAROSCOPY     ENDOVENOUS ABLATION  SAPHENOUS VEIN W/ LASER  06/2008   ENDOVENOUS ABLATION SAPHENOUS VEIN W/ LASER Left 12/09/2013   EVLA LEFT GREATER SAPHENOUS VEIN BY TODD EARLY MD   ESOPHAGOGASTRODUODENOSCOPY  12/18/2010   Procedure: ESOPHAGOGASTRODUODENOSCOPY (EGD);  Surgeon: Gatha Mayer, MD;  Location: [facility];  Service: Endoscopy;  Laterality: N/A;   I & D KNEE WITH POLY EXCHANGE Right 01/06/2021   Procedure: IRRIGATION AND DEBRIDEMENT RIGHT KNEE WITH  POLY EXCHANGE;  Surgeon: [name], MD;  Location: [facility];  Service: Orthopedics;  Laterality: Right;   JOINT REPLACEMENT Left    knee   LAPAROSCOPIC GASTRIC BANDING  01/05/2008   LAPAROSCOPIC GASTRIC BANDING  12/18/2010   01/04/2009   SALPHINGOOOPHORECTOMY Right    cyst removal   TONSILLECTOMY     TOTAL KNEE ARTHROPLASTY Left 07/31/2016   Procedure: LEFT TOTAL KNEE ARTHROPLASTY;  Surgeon: [name], MD;  Location: [facility];  Service: Orthopedics;  Laterality: Left;   TOTAL KNEE ARTHROPLASTY Right 12/23/2020   Procedure: RIGHT TOTAL KNEE ARTHROPLASTY;  Surgeon: [name], MD;  Location: [facility];  Service: Orthopedics;  Laterality: Right;   WEDGE RESECTION OF OVARY  LEFT OVARY    Social History   Social History Narrative   Not on file   Immunization History  Administered Date(s) Administered   Influenza Split 10/06/2012   Influenza,inj,Quad PF,6+ Mos 01/30/2016   Moderna Sars-Covid-2 Vaccination 09/07/2019, 10/09/2019   Pneumococcal Conjugate-13 11/29/2015     Objective: Vital Signs: There were no vitals taken for this visit.   Physical Exam   Musculoskeletal Exam: ***  CDAI Exam: CDAI Score: -- Patient Global: --; Provider Global: -- Swollen: --; Tender: -- Joint Exam 02/09/2021   No joint exam has been documented for this visit   There is currently no information documented on the homunculus. Go to the Rheumatology activity and complete the homunculus joint exam.  Investigation: No additional  findings.  Imaging: DG Chest 2 View  Result Date: 01/03/2021 CLINICAL DATA:  Shortness of breath.  Recent surgery. EXAM: CHEST - 2 VIEW COMPARISON:  08/30/2016 FINDINGS: The heart size and mediastinal contours are within normal limits. Both lungs are clear. The visualized skeletal structures are unremarkable. IMPRESSION: No active cardiopulmonary disease. Electronically Signed   By: Kerby Moors M.D.   On: 01/03/2021 13:59   CT Angio Chest PE W and/or Wo Contrast  Result Date: 01/04/2021 CLINICAL DATA:  Concern for pulmonary embolus EXAM: CT ANGIOGRAPHY CHEST WITH CONTRAST TECHNIQUE: Multidetector CT imaging of the chest was performed using the standard protocol during bolus administration of intravenous contrast. Multiplanar CT image reconstructions and MIPs were obtained to evaluate the vascular anatomy. CONTRAST:  128mL OMNIPAQUE IOHEXOL 350 MG/ML SOLN COMPARISON:  CT chest angio dated August 31, 2016 FINDINGS: Cardiovascular: Somewhat suboptimal contrast opacification of the pulmonary arteries. Within limitations, there is no evidence of pulmonary embolus to the  level of the segmental arteries. Normal heart size. No pericardial effusion. No significant coronary artery calcifications or significant atherosclerotic disease of thoracic aorta. Mediastinum/Nodes: Small hiatal hernia. Thyroid is unremarkable. No pathologically enlarged lymph nodes seen in the chest. Lungs/Pleura: Central airways are patent. Mild bilateral air trapping. Mild diffuse ground-glass opacities, compatible with atelectasis related to expiratory phase of imaging. No focal consolidation. No pneumothorax. Upper Abdomen: Cholecystectomy clips. Gastric band noted. No acute abnormality. Musculoskeletal: No chest wall abnormality. No acute or significant osseous findings. Review of the MIP images confirms the above findings. IMPRESSION: 1. No evidence of pulmonary embolus to the level of the segmental arteries. 2. Mild bilateral air  trapping, findings can be seen in the setting of small airways disease. 3. No acute intrathoracic process. Electronically Signed   By: Yetta Glassman M.D.   On: 01/04/2021 12:34   ECHOCARDIOGRAM COMPLETE  Result Date: 01/04/2021    ECHOCARDIOGRAM REPORT   Patient Name:   NEJLA REASOR Date of Exam: 01/04/2021 Medical Rec #:  [id number]         Height:       66.0 in Accession #:    [id number]        Weight:       314.4 lb Date of Birth:  01-Jul-1960          BSA:          2.424 m Patient Age:    35 years          BP:           133/61 mmHg Patient Gender: F                 HR:           65 bpm. Exam Location:  Inpatient Procedure: 2D Echo, Cardiac Doppler and Color Doppler Indications:    Dyspnea  History:        Patient has prior history of Echocardiogram examinations, most                 recent 07/21/2018. CHF, Arrythmias:Atrial Fibrillation,                 Signs/Symptoms:Shortness of Breath; Risk Factors:Dyslipidemia.  Sonographer:    Bernadene Person RDCS Referring Phys: [id number] [place]  1. Left ventricular ejection fraction, by estimation, is 60 to 65%. The left ventricle has normal function. The left ventricle has no regional wall motion abnormalities. Left ventricular diastolic parameters were normal.  2. Right ventricular systolic function is normal. The right ventricular size is normal. There is normal pulmonary artery systolic pressure.  3. Left atrial size was mildly dilated.  4. The mitral valve is normal in structure. No evidence of mitral valve regurgitation. No evidence of mitral stenosis.  5. The aortic valve is tricuspid. Aortic valve regurgitation is not visualized. Aortic valve sclerosis is present, with no evidence of aortic valve stenosis.  6. The inferior vena cava is normal in size with greater than 50% respiratory variability, suggesting right atrial pressure of 3 mmHg. Comparison(s): A prior study was performed on 07/21/2018. FINDINGS  Left Ventricle: Left  ventricular ejection fraction, by estimation, is 60 to 65%. The left ventricle has normal function. The left ventricle has no regional wall motion abnormalities. The left ventricular internal cavity size was normal in size. There is  no left ventricular hypertrophy. Left ventricular diastolic parameters were normal. Right Ventricle: The right ventricular size is normal. No increase in right ventricular wall thickness.  Right ventricular systolic function is normal. There is normal pulmonary artery systolic pressure. The tricuspid regurgitant velocity is 2.08 m/s, and  with an assumed right atrial pressure of 15 mmHg, the estimated right ventricular systolic pressure is 88.5 mmHg. Left Atrium: Left atrial size was mildly dilated. Right Atrium: Right atrial size was normal in size. Pericardium: There is no evidence of pericardial effusion. Mitral Valve: The mitral valve is normal in structure. Mild mitral annular calcification. No evidence of mitral valve regurgitation. No evidence of mitral valve stenosis. Tricuspid Valve: The tricuspid valve is normal in structure. Tricuspid valve regurgitation is not demonstrated. No evidence of tricuspid stenosis. Aortic Valve: The aortic valve is tricuspid. Aortic valve regurgitation is not visualized. Aortic valve sclerosis is present, with no evidence of aortic valve stenosis. Pulmonic Valve: The pulmonic valve was not well visualized. Pulmonic valve regurgitation is not visualized. No evidence of pulmonic stenosis. Aorta: The aortic root is normal in size and structure. Venous: The inferior vena cava is normal in size with greater than 50% respiratory variability, suggesting right atrial pressure of 3 mmHg. IAS/Shunts: No atrial level shunt detected by color flow Doppler.  LEFT VENTRICLE PLAX 2D LVIDd:         5.00 cm      Diastology LVIDs:         3.50 cm      LV e' medial:    8.92 cm/s LV PW:         1.10 cm      LV E/e' medial:  8.3 LV IVS:        0.80 cm      LV e' lateral:    10.80 cm/s LVOT diam:     2.30 cm      LV E/e' lateral: 6.9 LV SV:         108 LV SV Index:   45 LVOT Area:     4.15 cm  LV Volumes (MOD) LV vol d, MOD A2C: 150.0 ml LV vol d, MOD A4C: 168.0 ml LV vol s, MOD A2C: 73.0 ml LV vol s, MOD A4C: 83.0 ml LV SV MOD A2C:     77.0 ml LV SV MOD A4C:     168.0 ml LV SV MOD BP:      81.1 ml RIGHT VENTRICLE RV S prime:     14.90 cm/s TAPSE (M-mode): 2.2 cm LEFT ATRIUM             Index        RIGHT ATRIUM           Index LA diam:        4.40 cm 1.82 cm/m   RA Area:     19.90 cm LA Vol (A2C):   74.4 ml 30.70 ml/m  RA Volume:   54.30 ml  22.40 ml/m LA Vol (A4C):   84.0 ml 34.66 ml/m LA Biplane Vol: 85.6 ml 35.32 ml/m  AORTIC VALVE LVOT Vmax:   113.00 cm/s LVOT Vmean:  72.700 cm/s LVOT VTI:    0.260 m  AORTA Ao Root diam: 3.60 cm Ao Asc diam:  3.50 cm MITRAL VALVE               TRICUSPID VALVE MV Area (PHT): 1.74 cm    TR Peak grad:   17.3 mmHg MV Decel Time: 436 msec    TR Vmax:        208.00 cm/s MV E velocity: 74.10 cm/s MV A velocity: 45.80 cm/s  SHUNTS  MV E/A ratio:  1.62        Systemic VTI:  0.26 m                            Systemic Diam: 2.30 cm Kardie Tobb DO Electronically signed by Berniece Salines DO Signature Date/Time: 01/04/2021/6:37:57 PM    Final    VAS [place] LOWER EXTREMITY VENOUS (DVT) (7a-7p)  Result Date: 01/03/2021  Lower Venous DVT Study Patient Name:  ZARAI ORSBORN  Date of Exam:   01/03/2021 Medical Rec #: [id number]          Accession #:    [id number] Date of Birth: March 24, 1960           Patient Gender: F Patient Age:   53 years Exam [facility] Procedure:      VAS [place] LOWER EXTREMITY VENOUS (DVT) Referring Phys: RILEY RANSOM --------------------------------------------------------------------------------  Indications: Pain, Swelling, and Edema.  Limitations: Poor ultrasound/tissue interface and body habitus. Comparison Study: 08/07/20 prior Performing Technologist: Archie Patten RVS  Examination Guidelines: A complete evaluation  includes B-mode imaging, spectral Doppler, color Doppler, and power Doppler as needed of all accessible portions of each vessel. Bilateral testing is considered an integral part of a complete examination. Limited examinations for reoccurring indications may be performed as noted. The reflux portion of the exam is performed with the patient in reverse Trendelenburg.  +---------+---------------+---------+-----------+----------+-------------------+  RIGHT     Compressibility Phasicity Spontaneity Properties Thrombus Aging       +---------+---------------+---------+-----------+----------+-------------------+  CFV       Full            Yes       Yes                                         +---------+---------------+---------+-----------+----------+-------------------+  SFJ       Full                                                                  +---------+---------------+---------+-----------+----------+-------------------+  FV Prox   Full                                                                  +---------+---------------+---------+-----------+----------+-------------------+  FV Mid                    Yes       Yes                                         +---------+---------------+---------+-----------+----------+-------------------+  FV Distal                 Yes       Yes                                         +---------+---------------+---------+-----------+----------+-------------------+  PFV       Full                                                                  +---------+---------------+---------+-----------+----------+-------------------+  POP       Full            Yes       Yes                                         +---------+---------------+---------+-----------+----------+-------------------+  PTV       Full                                                                  +---------+---------------+---------+-----------+----------+-------------------+  PERO                                                        Not well visualized  +---------+---------------+---------+-----------+----------+-------------------+   +----+---------------+---------+-----------+----------+--------------+  LEFT Compressibility Phasicity Spontaneity Properties Thrombus Aging  +----+---------------+---------+-----------+----------+--------------+  CFV  Full            Yes       Yes                                    +----+---------------+---------+-----------+----------+--------------+    Summary: RIGHT: - There is no evidence of deep vein thrombosis in the lower extremity. However, portions of this examination were limited- see technologist comments above.  - No cystic structure found in the popliteal fossa.  LEFT: - No evidence of common femoral vein obstruction.  *See table(s) above for measurements and observations. Electronically signed by Harold Barban MD on 01/03/2021 at 7:02:43 PM.    Final    [place] EKG SITE RITE  Result Date: 01/07/2021 If Site Rite image not attached, placement could not be confirmed due to current cardiac rhythm.   Recent Labs: Lab Results  Component Value Date   WBC 4.6 01/13/2021   HGB 9.9 (L) 01/13/2021   PLT 286 01/13/2021   NA 134 (L) 01/12/2021   K 3.9 01/12/2021   CL 102 01/12/2021   CO2 25 01/12/2021   GLUCOSE 97 01/12/2021   BUN 6 (L) 01/12/2021   CREATININE 0.55 01/12/2021   BILITOT 0.6 01/12/2021   ALKPHOS 66 01/12/2021   AST 30 01/12/2021   ALT 20 01/12/2021   PROT 6.0 (L) 01/12/2021   ALBUMIN 2.3 (L) 01/12/2021   CALCIUM 8.4 (L) 01/12/2021   GFRAA 111 04/07/2020    Speciality Comments: PLQ eye exam: 08/15/2020 Normal. [facility]. Follow up in 6 months   Procedures:  No procedures performed Allergies: Contrast media [iodinated diagnostic agents] and Ancef [cefazolin sodium]   Assessment / Plan:  Visit Diagnoses: No diagnosis found.  Orders: No orders of the defined types were placed in this encounter.  No orders of the defined types were placed in  this encounter.   Face-to-face time spent with patient was *** minutes. Greater than 50% of time was spent in counseling and coordination of care.  Follow-Up Instructions: No follow-ups on file.   Earnestine Mealing, CMA  Note - This record has been created using Editor, commissioning.  Chart creation errors have been sought, but may not always  have been located. Such creation errors do not reflect on  the standard of medical care.

## 2021-01-27 ENCOUNTER — Telehealth: Payer: Self-pay | Admitting: Orthopaedic Surgery

## 2021-01-27 NOTE — Telephone Encounter (Signed)
Melissa (OT) called from [place] to inform Dr. Ninfa Linden pt denied OT eval. Pt states OT is not needed at this time and if and when she wants it she will call to request it. No call back needed. If any questions arise call Melissa at [phone].

## 2021-01-27 NOTE — Telephone Encounter (Signed)
FYI

## 2021-01-31 ENCOUNTER — Encounter: Payer: Self-pay | Admitting: Orthopaedic Surgery

## 2021-02-01 ENCOUNTER — Ambulatory Visit: Payer: No Typology Code available for payment source | Admitting: Internal Medicine

## 2021-02-02 ENCOUNTER — Encounter: Payer: Self-pay | Admitting: Orthopaedic Surgery

## 2021-02-02 ENCOUNTER — Ambulatory Visit (INDEPENDENT_AMBULATORY_CARE_PROVIDER_SITE_OTHER): Payer: No Typology Code available for payment source | Admitting: Orthopaedic Surgery

## 2021-02-02 DIAGNOSIS — Z96651 Presence of right artificial knee joint: Secondary | ICD-10-CM

## 2021-02-02 DIAGNOSIS — T8453XD Infection and inflammatory reaction due to internal right knee prosthesis, subsequent encounter: Secondary | ICD-10-CM

## 2021-02-02 NOTE — Progress Notes (Signed)
[name] is now almost 4 weeks status post an I&D and polyp changes of an acute infection involving her right total knee arthroplasty.  The infection was recognized early and she was taken to the operating room 2 weeks after surgery for a synovectomy and polyliner exchange as well as placement of antibiotic powder in the knee.  She is followed by the infectious disease clinic and is on IV antibiotics through a PICC line.  She has her first ID visit next week.  Examination of her right knee shows some redness at the inferior aspect of the incision but it may be more bruising than anything.  She has been appropriately icing her knee.  There is no drainage and she is bending it well.  She does report less pain.  She will keep her ID visit next week and hopefully they will see how she is doing from an antibiotic standpoint and potentially an acute phase reactant lab standpoint.  I will see her back in 2 weeks to see how she is doing overall.  Of note she did grow out MRSA.

## 2021-02-08 ENCOUNTER — Other Ambulatory Visit: Payer: Self-pay | Admitting: Orthopaedic Surgery

## 2021-02-08 ENCOUNTER — Ambulatory Visit (INDEPENDENT_AMBULATORY_CARE_PROVIDER_SITE_OTHER): Payer: No Typology Code available for payment source | Admitting: Internal Medicine

## 2021-02-08 ENCOUNTER — Encounter: Payer: Self-pay | Admitting: Internal Medicine

## 2021-02-08 ENCOUNTER — Other Ambulatory Visit: Payer: Self-pay

## 2021-02-08 ENCOUNTER — Encounter: Payer: Self-pay | Admitting: Orthopaedic Surgery

## 2021-02-08 DIAGNOSIS — T8450XD Infection and inflammatory reaction due to unspecified internal joint prosthesis, subsequent encounter: Secondary | ICD-10-CM

## 2021-02-08 MED ORDER — SULFAMETHOXAZOLE-TRIMETHOPRIM 800-160 MG PO TABS: 1.0000 | ORAL_TABLET | Freq: Two times a day (BID) | ORAL | 5 refills | Status: AC

## 2021-02-08 NOTE — Progress Notes (Signed)
HPI: 61 YF with PMHX as below presents for hospital follow-up of right knee PJI with MRSA. Pt was admitted 11/29-12/9 to [facility] for right knee PJI. She was admitted with concern for hematoma vs infection of right knee. She had undergone RTKA on 12/23/20 and had declined rehab at that point. Taken to the OR on 01/06/21 and underwent polyethylene exchange with Cx+ MRSA. ID engaged and recommended  6 weeks of IV antibiotics with daptomycin followed by oral suppression for 6 months Interim: Seen by Orthopedics(Gilbert Carlis Abbott, PA) on 12/22, sutures removed and steri-strips placed.  Today 02/08/21: Pt reports feeling well. She is accompanied by her husband. She reports erythema below right knee is consistent with erythema she experienced with left knee TKA.     Patient Active Problem List   Diagnosis Date Noted   Tachypnea 01/03/2021   Cellulitis of right knee 01/03/2021   Chronic anticoagulation 01/03/2021   Status post total right knee replacement 12/23/2020   Unilateral primary osteoarthritis, right knee 04/07/2020   Leg edema 08/19/2018   Presence of left artificial knee joint 08/14/2016   Unilateral primary osteoarthritis, left knee 07/31/2016   Status post total knee replacement, left 07/31/2016   Abdominal hematoma    Post-operative state    Paroxysmal atrial fibrillation ([place])    Controlled type 2 diabetes mellitus with diabetic nephropathy ([place])    Wound infection 01/25/2016   Hydronephrosis, left 01/25/2016   Hydronephrosis, right 01/25/2016   DVT (deep venous thrombosis) ([place]) 01/25/2016   Atrial fibrillation ([place]) 01/25/2016   Diabetes mellitus with complication ([place])    Wound infection after surgery, initial encounter    Varicose veins of lower extremities with complications 74/09/1446   Pain and swelling of left lower leg 10/27/2013   Pain of left lower extremity 10/27/2013   Gastroenteritis, acute 09/08/2013   Fever, unspecified 09/04/2013   ASD (atrial septal  defect) 08/13/2013   Pulmonary embolism ([place]) 06/22/2013   Dyspnea 06/11/2013   Anterior slip repaired Oct 2012 01/18/2011   GI bleed 12/18/2010   Diabetes mellitus ([place]) 12/18/2010   Varicose veins of lower extremities with other complications 18/56/3149   Diabetes mellitus    HIP PAIN, RIGHT 03/06/2010    Patient's Medications  New Prescriptions   No medications on file  Previous Medications   ACETAMINOPHEN (TYLENOL) 325 MG TABLET    Take 2 tablets (650 mg total) by mouth every 6 (six) hours as needed for mild pain (or Fever >/= 101).   DAPTOMYCIN (CUBICIN) IVPB    Inject 750 mg into the vein daily. Indication:  Knee prosthetic knee infection First Dose: No Last Day of Therapy:  02/18/21 Labs - Once weekly:  CBC/D, BMP, and CPK Labs - Every other week:  ESR and CRP Method of administration: IV Push Method of administration may be changed at the discretion of home infusion pharmacist based upon assessment of the patient and/or caregiver's ability to self-administer the medication ordered.   FERROUS SULFATE 325 (65 FE) MG TABLET    Take 1 tablet (325 mg total) by mouth daily with breakfast.   FOLIC ACID (FOLVITE) 1 MG TABLET    Take 1 tablet (1 mg total) by mouth daily.   HYDROXYCHLOROQUINE SULFATE PO    Hydroxychloroquine   LINACLOTIDE (LINZESS) 145 MCG CAPS CAPSULE    Linzess   METOPROLOL SUCCINATE (TOPROL-XL) 50 MG 24 HR TABLET    TAKE 1 TABLET BY MOUTH DAILY. TAKE WITH OR IMMEDIATELY FOLLOWING A MEAL.  MULTIPLE VITAMIN (MULTIVITAMIN) TABLET    Take 1 tablet by mouth daily.   OMEPRAZOLE (PRILOSEC) 20 MG CAPSULE    Take 20 mg by mouth 2 (two) times daily.   ONDANSETRON (ZOFRAN) 4 MG TABLET    Take 1 tablet (4 mg total) by mouth every 8 (eight) hours as needed for nausea or vomiting.   OXYCODONE (OXY IR/ROXICODONE) 5 MG IMMEDIATE RELEASE TABLET    Take 1-2 tablets (5-10 mg total) by mouth every 6 (six) hours as needed for moderate pain (pain score 4-6).   POLYETHYLENE GLYCOL (MIRALAX  / GLYCOLAX) 17 G PACKET    Take 17 g by mouth 2 (two) times daily.   PROMETHAZINE (PHENERGAN) 12.5 MG TABLET    Take 1 tablet (12.5 mg total) by mouth every 6 (six) hours as needed for nausea or vomiting.   RIVAROXABAN (XARELTO) 1 MG/ML SUSR    Xarelto   RIVAROXABAN (XARELTO) 20 MG TABS TABLET    Take 20 mg by mouth daily.    TOLTERODINE (DETROL LA) 2 MG 24 HR CAPSULE    Take 2 mg by mouth daily.  Modified Medications   No medications on file  Discontinued Medications   No medications on file    Review of Systems: Review of Systems  All other systems reviewed and are negative.  Past Medical History:  Diagnosis Date   Anemia    Arthritis    Atrial fibrillation ([place])    Bleeding ulcer 12-2010  hospitalized for 1 week   Blood transfusion    Blood transfusion without reported diagnosis    2011-2 units transfused, bleeding ulcer   Bruises easily    CHF (congestive heart failure) ([place])    with PE resolved after    Complication of anesthesia    Constipation    Difficulty in swallowing 03/24/2013   no longer a problem   Diverticulitis    DVT (deep venous thrombosis) ([place]) 06/2013   right leg   Dysrhythmia     Hx of A fib    GERD (gastroesophageal reflux disease)    Hyperlipidemia    Leg swelling 03/24/2013   not a problem any longer   Osteoarthritis    Peripheral vascular disease ([facility])    Pre-diabetes    Pulmonary embolus ([place]) 06/2013   Reflux    Shortness of breath    with PE   Vomiting 03/24/2013   problem resolved after lap band revision    Social History   Tobacco Use   Smoking status: Never   Smokeless tobacco: Never  Vaping Use   Vaping Use: Never used  Substance Use Topics   Alcohol use: No    Alcohol/week: 0.0 standard drinks   Drug use: No    Family History  Problem Relation Age of Onset   Heart disease Father    Kidney disease Father    Peripheral vascular disease Father    Heart attack Father        82   Diabetes Mother    Arthritis Mother      Allergies  Allergen Reactions   Contrast Media [Iodinated Contrast Media] Shortness Of Breath and Other (See Comments)    APNEA   Penicillin G Other (See Comments)   Ancef [Cefazolin Sodium] Nausea And Vomiting    Tolerates IV    Health Maintenance  Topic Date Due   FOOT EXAM  Never done   URINE MICROALBUMIN  Never done   Hepatitis C Screening  Never done   TETANUS/TDAP  Never done   Zoster Vaccines- Shingrix (1 of 2) Never done   PAP SMEAR-Modifier  Never done   MAMMOGRAM  02/11/2016   Pneumococcal Vaccine 38-67 Years old (2 - PPSV23 if available, else PCV20) 11/28/2016   COVID-19 Vaccine (3 - Moderna risk series) 11/06/2019   INFLUENZA VACCINE  09/05/2020   HEMOGLOBIN A1C  06/15/2021   OPHTHALMOLOGY EXAM  08/15/2021   COLONOSCOPY (Pts 45-96yr Insurance coverage will need to be confirmed)  07/06/2026   HIV Screening  Completed   HPV VACCINES  Aged Out    Objective:  There were no vitals filed for this visit. There is no height or weight on file to calculate BMI.  Physical Exam Constitutional:      Appearance: Normal appearance.  HENT:     Head: Normocephalic and atraumatic.     Right Ear: Tympanic membrane normal.     Left Ear: Tympanic membrane normal.     Nose: Nose normal.     Mouth/Throat:     Mouth: Mucous membranes are moist.  Eyes:     Extraocular Movements: Extraocular movements intact.     Conjunctiva/sclera: Conjunctivae normal.     Pupils: Pupils are equal, round, and reactive to light.  Cardiovascular:     Rate and Rhythm: Normal rate and regular rhythm.     Heart sounds: No murmur heard.   No friction rub. No gallop.  Pulmonary:     Effort: Pulmonary effort is normal.     Breath sounds: Normal breath sounds.  Abdominal:     General: Abdomen is flat.     Palpations: Abdomen is soft.  Musculoskeletal:        General: Normal range of motion.     Comments: Right arm PICC line C/D/I Right knee with steri strips, mild erythema below the knee   Skin:    General: Skin is warm and dry.  Neurological:     General: No focal deficit present.     Mental Status: She is alert and oriented to person, place, and time.  Psychiatric:        Mood and Affect: Mood normal.    Lab Results Lab Results  Component Value Date   WBC 4.6 01/13/2021   HGB 9.9 (L) 01/13/2021   HCT 32.9 (L) 01/13/2021   MCV 84.6 01/13/2021   PLT 286 01/13/2021    Lab Results  Component Value Date   CREATININE 0.55 01/12/2021   BUN 6 (L) 01/12/2021   NA 134 (L) 01/12/2021   K 3.9 01/12/2021   CL 102 01/12/2021   CO2 25 01/12/2021    Lab Results  Component Value Date   ALT 20 01/12/2021   AST 30 01/12/2021   ALKPHOS 66 01/12/2021   BILITOT 0.6 01/12/2021    No results found for: CHOL, HDL, LDLCALC, LDLDIRECT, TRIG, CHOLHDL No results found for: LABRPR, RPRTITER No results found for: HIV1RNAQUANT, HIV1RNAVL, CD4TABS   Assessment/: # MRSA Right Knee PJI SP I&D and poly-exchange -Plan to complete 6 weeks of IV antibiotics with daptomycin for OR(EOT 02/18/21). Rifampin contraindicated as pt is on xarelto. -Pt reports right knee erythema is consistent with wound healing she experienced with left knee replacement. Although, ESR(71) and CRP(17) on 01/27/21 are trending up.  Plan: -If CRP and ESR this week continue to trend up or right knee erythema worsens will plan on MRI. -Last dose of daptomycin on 02/18/21. Start bactrim 1 DS PO bid for chronic suppression the next day. Will follow-up in 1 month  and plan to do cbc, cmp, esr and crp.    Laurice Record, MD [facility] 02/08/2021, 3:25 PM

## 2021-02-09 ENCOUNTER — Ambulatory Visit: Payer: No Typology Code available for payment source | Admitting: Physician Assistant

## 2021-02-12 ENCOUNTER — Encounter: Payer: Self-pay | Admitting: Cardiovascular Disease

## 2021-02-12 NOTE — Progress Notes (Signed)
Date:  02/13/2021   ID:  [name], DOB Mar 23, 1960, MRN [id number]  Patient Location: Home Provider Location: Home  PCP:  Mateo Flow, MD  Cardiologist:  Mertie Moores, MD  Electrophysiologist:  None   Problem List Problem list 1. Pulmonary embolus 2. Atrial fibrillation 3.   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  The patient has an interesting cardiopulmonary history.  She was in her usual state of health until late April 2015 when she began to have pulmonary congestion symptoms and thought that she had the flu.  She saw her PCP in [place] several times initially her oxygen saturation at the first visit was 96% and the second visit was 92%.  She continued to worsen and collapsed at home.  She was brought to [facility] where in the emergency room her d-dimer was 13 and she underwent a CT angiogram which showed massive pulmonary embolus with saddle embolus.  She had slight elevation of her troponins consistent with pulmonary embolus.  Her initial echocardiogram on 06/11/13 showed a moderate hypertension with a pulmonary artery pressure of 41.  The ventricular septum showed paradoxic motion consistent with right ventricular volume overload.  There was a probable medium sized ostium secundum ASD present.  The right atrium was markedly dilated.  The patient went on to be treated with transcatheter ultrasound assisted thrombolytic infusion therapy for her saddle embolus.  She had a good clinical response.  She had a followup echocardiogram on 06/22/13 which showed that her pulmonary artery pressure had returned to normal at 16.  The right atrial size was normal.  The right ventricular size was normal.  There was no evidence of ASD or PFO. The patient was evaluated for hypercoagulable state as the cause of her pulmonary embolus and her DVT of her right leg.  However the hypercoagulability workup was negative.  The patient does not have any family history of  abnormal blood clotting. The patient herself has no prior cardiac history.  She does have a past history of morbid obesity and has had lap band surgery by Dr. Johnathan Hausen.  The patient initially weighed about 388 pounds.  Since her surgery she has lost approximately 130 pounds. She still is experiencing mild exertional dyspnea.  She denies any chest discomfort.  She has not been aware of any racing of her heart.   May 06, 2015 Was recently found have atrial fibrillation. She's been having some increased shortness of breath and fatigue. She came to the [facility] emergency room and was found have atrial fibrillation at rate of 154. Was treated with Diltiazem and metoprolol and she feels much better.   Thinks that she has converted .   July 10, 2016:   [name] is here for pre- op clearance for left knee replacement  Has not any noticeable episodes of Afib .  On Xarelto .   July 22, 2017:   Had an episode of PAF after taking cold meds.  Resolved after fluid, rest, and an extra metoprolol  Has mild swelling in feet  Is not getting exercise, sits most of the day  Wt. Is 276.    Evaluation Performed:  Follow-Up Visit  Chief Complaint:  Follow up PAF and hx of pulmonary embolus  July 17, 2018   [name] is a 60 y.o. female with a history of paroxysmal atrial fibrillation and a history of pulmonary embolus.  Several weeks ago woke up with AF,  Dyspnea,  Lasted for 45 min Took 2nd metoprolol at that time Developed leg swelling and shortness of breath Was started on lasix  Leg edema has not improved despite lasix  ( she showed me her legs on camara )  Left >> right leg swelling   The patient does not have symptoms concerning for COVID-19 infection (fever, chills, cough, or new shortness of breath).    August 19, 2018:  [name] is seen today for follow up visit.     Wt is 317 lbs  - up 40 lbs  Had lap band surgery 10 years ago .   We saw her in June - telemedicine visit  -   Had developed 2+ leg edema  We changed the lasix to Torsemide.  Echo for normal left ventricular systolic function.  Ejection fraction is 60 to 65%.  Normal diastolic function.  Mild thickening of the aortic valve.  Event monitor showed rare episoes of NSVT.     Jan. 28, 2021  Last visit was via telemedicine  Doing well from a cardiac standpont  Her event monitor revealed 5 beats of supraventricular tachycardia.  She seems to be much better on the Toprol. Has rare episodes of palpitations that only last for a second or 2.  Not associated with syncope or presyncope. No exercise,  Works at [employer].    Had an RA flare this past summer .   Arthritis is better on Plaquenil  Has developed diverticulitis    Sept. 23, 2022 [name] is seen for follow upf visit  - hx of DVT / saddle pulmonary embolism  Has had some palpitations - was found to have SVT- last for only a second or 2 Wt is 334 lbs  Has started diabetes medication  Hb A1C was 5.8  Going for R knee replacement soon She is at low risk for this surgery  OK to hold Xarelto - will defer to Pharm D for  timing / duration . She has used Lovenox bridging in the past if the decision is made to bridge her .   Jan. 9, 2023 [name] is seen today for follow up visit  Hx of DVT / saddle embolus Wt.  = 303 ( down 31 lbs)   Went in for knee replacement  to [facility]  On day 3-4 post op Had severe dyspnea, presyncope with ambulation  Had gone into rapid afib .  Was eventually found to have an infected prosthetic knee   Was treated with metoprolol , higher dose.  Converted to NSR with the metoprolol   Had severe dyspnea a week or so following her Dc,    Chest CT angio at [facility] ruled out PE  Has a PICC line,  get Daptomycin ( had vancomycin resistant MRSA)    Past Medical History:  Diagnosis Date   Anemia    Arthritis    Atrial fibrillation ([place])    Bleeding ulcer 12-2010  hospitalized for 1 week   Blood transfusion    Blood  transfusion without reported diagnosis    2011-2 units transfused, bleeding ulcer   Bruises easily    CHF (congestive heart failure) ([place])    with PE resolved after    Complication of anesthesia    Constipation    Difficulty in swallowing 03/24/2013   no longer a problem   Diverticulitis    DVT (deep venous thrombosis) ([place]) 06/2013   right leg   Dysrhythmia     Hx of A fib  GERD (gastroesophageal reflux disease)    Hyperlipidemia    Leg swelling 03/24/2013   not a problem any longer   Osteoarthritis    Peripheral vascular disease ([place])    Pre-diabetes    Pulmonary embolus ([place]) 06/2013   Reflux    Shortness of breath    with PE   Vomiting 03/24/2013   problem resolved after lap band revision   Past Surgical History:  Procedure Laterality Date   APPENDECTOMY     [name]     CHOLECYSTECTOMY     4'40   COLONOSCOPY N/A 04/10/2013   Procedure: COLONOSCOPY;  Surgeon: Beryle Beams, MD;  Location: [facility];  Service: Endoscopy;  Laterality: N/A;   DENTAL SURGERY  10/2019   tooth implant   DIAGNOSTIC LAPAROSCOPY     ENDOVENOUS ABLATION SAPHENOUS VEIN W/ LASER  06/2008   ENDOVENOUS ABLATION SAPHENOUS VEIN W/ LASER Left 12/09/2013   EVLA LEFT GREATER SAPHENOUS VEIN BY TODD EARLY MD   ESOPHAGOGASTRODUODENOSCOPY  12/18/2010   Procedure: ESOPHAGOGASTRODUODENOSCOPY (EGD);  Surgeon: Gatha Mayer, MD;  Location: [facility];  Service: Endoscopy;  Laterality: N/A;   I & D KNEE WITH POLY EXCHANGE Right 01/06/2021   Procedure: IRRIGATION AND DEBRIDEMENT RIGHT KNEE WITH  POLY EXCHANGE;  Surgeon: [name], MD;  Location: [facility];  Service: Orthopedics;  Laterality: Right;   JOINT REPLACEMENT Left    knee   LAPAROSCOPIC GASTRIC BANDING  01/05/2008   LAPAROSCOPIC GASTRIC BANDING  12/18/2010   01/04/2009   SALPHINGOOOPHORECTOMY Right    cyst removal   TONSILLECTOMY     TOTAL KNEE ARTHROPLASTY Left 07/31/2016   Procedure: LEFT TOTAL KNEE  ARTHROPLASTY;  Surgeon: [name], MD;  Location: [facility];  Service: Orthopedics;  Laterality: Left;   TOTAL KNEE ARTHROPLASTY Right 12/23/2020   Procedure: RIGHT TOTAL KNEE ARTHROPLASTY;  Surgeon: [name], MD;  Location: [facility];  Service: Orthopedics;  Laterality: Right;   WEDGE RESECTION OF OVARY  LEFT OVARY      Current Meds  Medication Sig   HYDROXYCHLOROQUINE SULFATE PO Hydroxychloroquine   linaclotide (LINZESS) 145 MCG CAPS capsule Linzess   metoprolol succinate (TOPROL-XL) 50 MG 24 hr tablet TAKE 1 TABLET BY MOUTH DAILY. TAKE WITH OR IMMEDIATELY FOLLOWING A MEAL.   Multiple Vitamin (MULTIVITAMIN) tablet Take 1 tablet by mouth daily.   omeprazole (PRILOSEC) 20 MG capsule Take 20 mg by mouth 2 (two) times daily.   ondansetron (ZOFRAN) 4 MG tablet Take 1 tablet (4 mg total) by mouth every 8 (eight) hours as needed for nausea or vomiting.   polyethylene glycol (MIRALAX / GLYCOLAX) 17 g packet Take 17 g by mouth 2 (two) times daily.   promethazine (PHENERGAN) 12.5 MG tablet Take 1 tablet (12.5 mg total) by mouth every 6 (six) hours as needed for nausea or vomiting.   rivaroxaban (XARELTO) 20 MG TABS tablet Take 20 mg by mouth daily.    sulfamethoxazole-trimethoprim (BACTRIM DS) 800-160 MG tablet Take 1 tablet by mouth 2 (two) times daily.     Allergies:   Contrast media [iodinated contrast media], Penicillin g, and Ancef [cefazolin sodium]   Social History   Tobacco Use   Smoking status: Never   Smokeless tobacco: Never  Vaping Use   Vaping Use: Never used  Substance Use Topics   Alcohol use: No    Alcohol/week: 0.0 standard drinks   Drug use: No     Family Hx: The patient's family history includes Arthritis in  her mother; Diabetes in her mother; Heart attack in her father; Heart disease in her father; Kidney disease in her father; Peripheral vascular disease in her father.  ROS:   Please see the history of present illness.     All other systems  reviewed and are negative.   Prior CV studies:   The following studies were reviewed today:    Labs/Other Tests and Data Reviewed:    EKG:   Sept. 23, 2022:   NSR at 86 , inc RBBB   Recent Labs: 01/03/2021: B Natriuretic Peptide 61.3 01/12/2021: ALT 20; BUN 6; Creatinine, Ser 0.55; Potassium 3.9; Sodium 134 01/13/2021: Hemoglobin 9.9; Platelets 286   Recent Lipid Panel No results found for: CHOL, TRIG, HDL, CHOLHDL, LDLCALC, LDLDIRECT  Wt Readings from Last 3 Encounters:  02/13/21 (!) 303 lb 3.2 oz (137.5 kg)  01/13/21 (!) 302 lb 4 oz (137.1 kg)  12/23/20 (!) 314 lb 6.4 oz (142.6 kg)     Objective:     Physical Exam: Blood pressure 118/76, pulse 68, height 5\' 6"  (1.676 m), weight (!) 303 lb 3.2 oz (137.5 kg), SpO2 97 %.  GEN:  morbidly obese female.  NAD  HEENT: Normal NECK: No JVD; No carotid bruits LYMPHATICS: No lymphadenopathy CARDIAC: RRR , no murmurs, rubs, gallops RESPIRATORY:  Clear to auscultation without rales, wheezing or rhonchi  ABDOMEN: Soft, non-tender, non-distended MUSCULOSKELETAL:  No edema; No deformity  SKIN: Warm and dry NEUROLOGIC:  Alert and oriented x 3     ASSESSMENT & PLAN:     1.  PAF:    She had had an episode of paroxysmal atrial fibrillation associated with her total knee replacement complicated by prosthetic infection.  She converted with metoprolol.  She seems to be doing well now.  2.  Hx of saddle pulmonary embolus: Continue anticoagulant.   3.  Palpitations:    4.  Pre op for R TKA:     Medication Adjustments/Labs and Tests Ordered: Current medicines are reviewed at length with the patient today.  Concerns regarding medicines are outlined above.   Tests Ordered: No orders of the defined types were placed in this encounter.    Medication Changes: No orders of the defined types were placed in this encounter.    Disposition:  Follow up in 1 year   Signed, Mertie Moores, MD  02/13/2021 6:08 PM    [place]

## 2021-02-13 ENCOUNTER — Encounter: Payer: Self-pay | Admitting: Cardiovascular Disease

## 2021-02-13 ENCOUNTER — Ambulatory Visit (INDEPENDENT_AMBULATORY_CARE_PROVIDER_SITE_OTHER): Payer: No Typology Code available for payment source | Admitting: Cardiovascular Disease

## 2021-02-13 ENCOUNTER — Other Ambulatory Visit: Payer: Self-pay

## 2021-02-13 VITALS — BP 118/76 | HR 68 | Ht 66.0 in | Wt 303.2 lb

## 2021-02-13 DIAGNOSIS — I2601 Septic pulmonary embolism with acute cor pulmonale: Secondary | ICD-10-CM | POA: Diagnosis not present

## 2021-02-13 DIAGNOSIS — I480 Paroxysmal atrial fibrillation: Secondary | ICD-10-CM | POA: Diagnosis not present

## 2021-02-13 NOTE — Patient Instructions (Signed)
Medication Instructions:  Your physician recommends that you continue on your current medications as directed. Please refer to the Current Medication list given to you today.  *If you need a refill on your cardiac medications before your next appointment, please call your pharmacy*   Lab Work: none If you have labs (blood work) drawn today and your tests are completely normal, you will receive your results only by: [place] (if you have MyChart) OR A paper copy in the mail If you have any lab test that is abnormal or we need to change your treatment, we will call you to review the results.   Testing/Procedures: none   Follow-Up: At [facility], you and your health needs are our priority.  As part of our continuing mission to provide you with exceptional heart care, we have created designated Provider Care Teams.  These Care Teams include your primary Cardiologist (physician) and Advanced Practice Providers (APPs -  Physician Assistants and Nurse Practitioners) who all work together to provide you with the care you need, when you need it.  We recommend signing up for the patient portal called "MyChart".  Sign up information is provided on this After Visit Summary.  MyChart is used to connect with patients for Virtual Visits (Telemedicine).  Patients are able to view lab/test results, encounter notes, upcoming appointments, etc.  Non-urgent messages can be sent to your provider as well.   To learn more about what you can do with MyChart, go to [email or web address].    Your next appointment:   1 year(s)  The format for your next appointment:   In Person  Provider:   Richardson Dopp, PA-C         Other Instructions

## 2021-02-16 ENCOUNTER — Other Ambulatory Visit: Payer: Self-pay

## 2021-02-16 ENCOUNTER — Encounter: Payer: Self-pay | Admitting: Physician Assistant

## 2021-02-16 ENCOUNTER — Ambulatory Visit (INDEPENDENT_AMBULATORY_CARE_PROVIDER_SITE_OTHER): Payer: No Typology Code available for payment source | Admitting: Physician Assistant

## 2021-02-16 ENCOUNTER — Encounter: Payer: Self-pay | Admitting: Orthopaedic Surgery

## 2021-02-16 DIAGNOSIS — T8453XD Infection and inflammatory reaction due to internal right knee prosthesis, subsequent encounter: Secondary | ICD-10-CM

## 2021-02-16 DIAGNOSIS — Z96651 Presence of right artificial knee joint: Secondary | ICD-10-CM

## 2021-02-16 NOTE — Progress Notes (Signed)
HPI: [name] returns today 2 weeks sooner if there is any questions concerns.  Questions were encouraged and answered by Dr. Ninfa Linden self.  Status post irrigation debridement of right total knee with polyexchange due to the acute infection and right total knee arthroplasty which was performed on 12/23/2020.  She is being followed by infectious disease.  She is due to go on Bactrim next week.  She states she has had no drainage.  She still has redness but states this does resolve with elevation.  Denies any fevers or chills.  She been working with home health on range of motion strengthening the knee.  She does present today in wheelchair. Note from infectious disease dated 02/08/2021 states that her ESR and CRP are trending upward.  ESR 71 and CRP 17 per their note.  Physical exam: Right knee more diffuse area of erythema over the anterior inferior aspect of the surgical incision.  There is no drainage.  Surgical incisions healing well.  She has full extension and flexion to approximately 85 degrees.  Impression: Status post right total knee arthroplasty 12/23/2020 Status post I&D right total knee arthroplasty 01/06/2021  Plan: We will repeat her sed rate and CRP.  Continue antibiotics per infectious disease.  She did ask for a referral to the total joint infection clinic in [place] we will make this referral for her.  We will continue her home health/physical therapy.  Follow-up in 2 weeks sooner if there is any questions concerns.  Questions encouraged and answered by Dr. Ninfa Linden and myself.

## 2021-02-17 ENCOUNTER — Encounter: Payer: Self-pay | Admitting: Internal Medicine

## 2021-02-17 DIAGNOSIS — T8450XS Infection and inflammatory reaction due to unspecified internal joint prosthesis, sequela: Secondary | ICD-10-CM

## 2021-02-17 LAB — SEDIMENTATION RATE: Sed Rate: 45 mm/h — ABNORMAL HIGH (ref 0–30)

## 2021-02-17 LAB — C-REACTIVE PROTEIN: CRP: 15.2 mg/L — ABNORMAL HIGH (ref <8.0)

## 2021-02-20 ENCOUNTER — Telehealth: Payer: Self-pay

## 2021-02-20 NOTE — Telephone Encounter (Signed)
Informed patient of results below and Dr.Singh has ordered a MRI of her right knee. Patient stated that she spoke with Dr.Blackman and was told that an MRI wouldn't be necessary.    Verbal orders given to Amy with [facility] to pull PICC line today (02/20/2021).  Amy repeated orders back to me and /verbalized her understanding.

## 2021-02-20 NOTE — Telephone Encounter (Signed)
Thanks Tiffany!

## 2021-02-23 ENCOUNTER — Encounter: Payer: Self-pay | Admitting: Orthopaedic Surgery

## 2021-02-27 ENCOUNTER — Telehealth: Payer: Self-pay | Admitting: *Deleted

## 2021-02-27 NOTE — Telephone Encounter (Signed)
Received vm from [facility] stating needs lab results of culture for this pt. Lab results/cultture were faxed to [phone]

## 2021-03-02 ENCOUNTER — Other Ambulatory Visit: Payer: Self-pay

## 2021-03-02 ENCOUNTER — Ambulatory Visit (INDEPENDENT_AMBULATORY_CARE_PROVIDER_SITE_OTHER): Payer: No Typology Code available for payment source | Admitting: Orthopaedic Surgery

## 2021-03-02 ENCOUNTER — Other Ambulatory Visit: Payer: Self-pay | Admitting: Orthopaedic Surgery

## 2021-03-02 DIAGNOSIS — T8453XD Infection and inflammatory reaction due to internal right knee prosthesis, subsequent encounter: Secondary | ICD-10-CM

## 2021-03-02 DIAGNOSIS — Z96651 Presence of right artificial knee joint: Secondary | ICD-10-CM

## 2021-03-02 NOTE — Progress Notes (Signed)
[name] is now almost 8 weeks status post irrigation and debridement with polyliner exchange of an acute infection of her right total knee.  Her right knee was replaced 2 weeks prior to that.  She was hospitalized for a longer period of time due to A. fib with RVR.  She then unfortunately developed draining from her knee and we took her to the operating room exactly 2 weeks after her original initial knee replacement and performed an open synovectomy with irrigation debridement and polyliner exchange.  We also placed vancomycin powder in the knee joint.  Infectious disease was consulted and she unfortunately grew out MRSA.  She has been working to home health therapy to get her knee bending and moving.  She does feel better overall.  She says the redness is dissipating.  We did send her as a consultation to the orthopedic regional infectious disease clinic in [place].  She has an appoint with them next week.  Examination of her right knee today shows a rash lateral to her incision.  The rest of the redness has dissipated completely and she is tolerating the putting her through motion with that knee.  I will have her try hydrocortisone cream on the rash daily.  We need home health therapy to extend her home health to work on range of motion and balance and coronation as well as strengthening for the next 4 weeks.  I would not be comfortable with her going to outpatient therapy given her MRSA.  She is scheduled to go back to work in February but I do not think that this is reasonable given the infection in her knee and the treatment is needed for this.  I do not anticipate her being able to return to work until least April as we are trying to clear this infection in her knee.  I would like to see her back in 2 weeks so we can see what the rash looks like on her right knee and to hear what the regional orthopedic infectious disease clinic in [place] says.

## 2021-03-05 ENCOUNTER — Other Ambulatory Visit: Payer: Self-pay | Admitting: Physician Assistant

## 2021-03-06 ENCOUNTER — Telehealth: Payer: Self-pay | Admitting: Orthopaedic Surgery

## 2021-03-06 NOTE — Telephone Encounter (Signed)
Next Visit: Due January 2023. Message sent to the front to schedule patient.    Last Visit: 09/09/2020   Labs: 01/12/2021 Sodium 134, BUN 6, Calcium 8.4, Total Protein 6.0, Albumin 2.3, Hgb 10.0, Hct 33.1, MCH 25.1, RDW 18.6   Eye exam: 08/15/2020 Normal.    Current Dose per office note 09/09/2020: Plaquenil 200 mg 1 tablet by mouth twice daily Monday to Friday   NP:[id number] arthritis with rheumatoid factor of multiple sites without organ or systems involvement    Last Fill: 12/06/2020   Okay to refill Plaquenil?

## 2021-03-06 NOTE — Telephone Encounter (Signed)
LMOM for patient to call back and schedule follow up appointment.

## 2021-03-06 NOTE — Telephone Encounter (Signed)
Patient states the PLQ was discontinued. Patient states the infectious disease doctor had her stopped the PLQ. She states she has been on antibiotics for a while now.

## 2021-03-06 NOTE — Telephone Encounter (Signed)
Please clarify if the patient is still taking plaquenil since it was discontinued on 01/13/21 from her med list.

## 2021-03-06 NOTE — Telephone Encounter (Signed)
Pt.'s husband Herbie Baltimore came and dropped off 2 medical release forms, short term disability forms, and FMLA forms, and $50.00 cash payment. Accepted 03/06/21

## 2021-03-06 NOTE — Telephone Encounter (Signed)
Please schedule patient for a follow up visit. Patient due January 2023. Thanks!

## 2021-03-09 ENCOUNTER — Telehealth: Payer: Self-pay | Admitting: *Deleted

## 2021-03-09 DIAGNOSIS — K6289 Other specified diseases of anus and rectum: Secondary | ICD-10-CM | POA: Insufficient documentation

## 2021-03-09 DIAGNOSIS — Z9889 Other specified postprocedural states: Secondary | ICD-10-CM | POA: Insufficient documentation

## 2021-03-09 DIAGNOSIS — K5641 Fecal impaction: Secondary | ICD-10-CM | POA: Insufficient documentation

## 2021-03-09 NOTE — Telephone Encounter (Signed)
Received call from [facility] stating they are needing Labs/culture faxed to [phone], these orders were faxed.

## 2021-03-15 ENCOUNTER — Other Ambulatory Visit: Payer: Self-pay | Admitting: Orthopaedic Surgery

## 2021-03-15 ENCOUNTER — Encounter: Payer: Self-pay | Admitting: Orthopaedic Surgery

## 2021-03-15 ENCOUNTER — Other Ambulatory Visit: Payer: Self-pay

## 2021-03-15 ENCOUNTER — Ambulatory Visit (INDEPENDENT_AMBULATORY_CARE_PROVIDER_SITE_OTHER): Payer: No Typology Code available for payment source | Admitting: Orthopaedic Surgery

## 2021-03-15 VITALS — Ht 66.0 in | Wt 303.0 lb

## 2021-03-15 DIAGNOSIS — T8453XD Infection and inflammatory reaction due to internal right knee prosthesis, subsequent encounter: Secondary | ICD-10-CM

## 2021-03-15 MED ORDER — FLUCONAZOLE 150 MG PO TABS: 150.0000 mg | ORAL_TABLET | Freq: Every day | ORAL | 0 refills | Status: DC

## 2021-03-15 NOTE — Progress Notes (Signed)
[name] comes in today for continued follow-up as it relates to her infected right total knee arthroplasty.  This was a MRSA infection.  She has been seen by the infection joint specialists down in [place].  They will see her back in 2 months.  She is still on antibiotics.  She states that there is likely a chance that she needs to have this excised and will be in hospital for a while with an infusional antibiotic type of VAC.  She does state that the specialist down there would like her to lose 100 pounds.  She does have a BMI of almost 49.  I successfully replaced her left knee several years ago.  That knee has done well for her.  I do recognize that her BMI was high for joint replacement surgery but I have known her for many years now and she is motivated and has done well.  Certainly there is a higher risk of infections after joint replacement surgery and we are still working on finding the source of her infection.  She had swab negative for staph or MRSA in the preoperative standpoint before her primary right total knee arthroplasty.  She is working better with her mobility but is having a lot of pain if she sits for a while or even stands or walks for a while.  I have needed to extend her out of work until April given the situation.  I feel like she would benefit more from long-term disability given the likelihood of more surgery in the future and given the pain that she is having with her right knee.  She has been also treating a rash with hydrocortisone cream and that is gotten a little bit better.  Today her right knee has pretty good flexion extension.  It is swollen but there is no redness today.  I will see her back in about 6 weeks so we can still continue with her disability as it relates to being out of work and this unfortunate situation as it relates to her infected right total knee arthroplasty.

## 2021-03-17 ENCOUNTER — Other Ambulatory Visit: Payer: Self-pay

## 2021-03-17 ENCOUNTER — Ambulatory Visit (INDEPENDENT_AMBULATORY_CARE_PROVIDER_SITE_OTHER): Payer: No Typology Code available for payment source | Admitting: Internal Medicine

## 2021-03-17 ENCOUNTER — Encounter: Payer: Self-pay | Admitting: Internal Medicine

## 2021-03-17 VITALS — BP 120/77 | HR 67 | Temp 97.8°F

## 2021-03-17 DIAGNOSIS — T8450XD Infection and inflammatory reaction due to unspecified internal joint prosthesis, subsequent encounter: Secondary | ICD-10-CM | POA: Diagnosis not present

## 2021-03-17 DIAGNOSIS — T8450XS Infection and inflammatory reaction due to unspecified internal joint prosthesis, sequela: Secondary | ICD-10-CM

## 2021-03-17 NOTE — Progress Notes (Signed)
Patient Active Problem List   Diagnosis Date Noted   Tachypnea 01/03/2021   Cellulitis of right knee 01/03/2021   Chronic anticoagulation 01/03/2021   Status post total right knee replacement 12/23/2020   Unilateral primary osteoarthritis, right knee 04/07/2020   Leg edema 08/19/2018   Presence of left artificial knee joint 08/14/2016   Unilateral primary osteoarthritis, left knee 07/31/2016   Status post total knee replacement, left 07/31/2016   Abdominal hematoma    Post-operative state    Paroxysmal atrial fibrillation ([place])    Controlled type 2 diabetes mellitus with diabetic nephropathy ([place])    Wound infection 01/25/2016   Hydronephrosis, left 01/25/2016   Hydronephrosis, right 01/25/2016   DVT (deep venous thrombosis) ([place]) 01/25/2016   Atrial fibrillation ([place]) 01/25/2016   Diabetes mellitus with complication ([place])    Wound infection after surgery, initial encounter    Varicose veins of lower extremities with complications 45/80/9983   Pain and swelling of left lower leg 10/27/2013   Pain of left lower extremity 10/27/2013   Gastroenteritis, acute 09/08/2013   Fever, unspecified 09/04/2013   ASD (atrial septal defect) 08/13/2013   Pulmonary embolism ([place]) 06/22/2013   Dyspnea 06/11/2013   Anterior slip repaired Oct 2012 01/18/2011   GI bleed 12/18/2010   Diabetes mellitus ([place]) 12/18/2010   Varicose veins of lower extremities with other complications 38/25/0539   Diabetes mellitus    HIP PAIN, RIGHT 03/06/2010    Patient's Medications  New Prescriptions   No medications on file  Previous Medications   ACETAMINOPHEN (TYLENOL) 325 MG TABLET    Take 2 tablets (650 mg total) by mouth every 6 (six) hours as needed for mild pain (or Fever >/= 101).   FERROUS SULFATE 325 (65 FE) MG TABLET    Take 1 tablet (325 mg total) by mouth daily with breakfast.   FLUCONAZOLE (DIFLUCAN) 150 MG TABLET    TAKE 1 TABLET BY MOUTH EVERY DAY FOR 3 DAYS   FOLIC ACID  (FOLVITE) 1 MG TABLET    Take 1 tablet (1 mg total) by mouth daily.   HYDROXYCHLOROQUINE SULFATE PO    Hydroxychloroquine   LINACLOTIDE (LINZESS) 145 MCG CAPS CAPSULE    Linzess   METHOCARBAMOL (ROBAXIN) 500 MG TABLET    Take 500 mg by mouth every 6 (six) hours as needed.   METOPROLOL SUCCINATE (TOPROL-XL) 50 MG 24 HR TABLET    TAKE 1 TABLET BY MOUTH DAILY. TAKE WITH OR IMMEDIATELY FOLLOWING A MEAL.   MULTIPLE VITAMIN (MULTIVITAMIN) TABLET    Take 1 tablet by mouth daily.   NITROFURANTOIN, MACROCRYSTAL-MONOHYDRATE, (MACROBID) 100 MG CAPSULE    Take 100 mg by mouth daily.   OMEPRAZOLE (PRILOSEC) 20 MG CAPSULE    Take 20 mg by mouth 2 (two) times daily.   ONDANSETRON (ZOFRAN) 4 MG TABLET    Take 1 tablet (4 mg total) by mouth every 8 (eight) hours as needed for nausea or vomiting.   OXYCODONE (OXY IR/ROXICODONE) 5 MG IMMEDIATE RELEASE TABLET    Take 1-2 tablets (5-10 mg total) by mouth every 6 (six) hours as needed for moderate pain (pain score 4-6).   POLYETHYLENE GLYCOL (MIRALAX / GLYCOLAX) 17 G PACKET    Take 17 g by mouth 2 (two) times daily.   PROMETHAZINE (PHENERGAN) 12.5 MG TABLET    Take 1 tablet (12.5 mg total) by mouth every 6 (six) hours as needed for nausea or vomiting.   RIVAROXABAN (XARELTO) 1 MG/ML  SUSR    Xarelto   RIVAROXABAN (XARELTO) 20 MG TABS TABLET    Take 20 mg by mouth daily.    SULFAMETHOXAZOLE-TRIMETHOPRIM (BACTRIM DS) 800-160 MG TABLET    Take 1 tablet by mouth 2 (two) times daily.   TOLTERODINE (DETROL LA) 2 MG 24 HR CAPSULE    Take 2 mg by mouth daily.  Modified Medications   No medications on file  Discontinued Medications   PROMETHAZINE (PHENERGAN) 12.5 MG TABLET    Take 1 tablet by mouth every 6 (six) hours as needed.    Subjective: 61 YF with PMHX as below presents for hospital follow-up of right knee PJI with MRSA. Pt was admitted 11/29-12/9 to [facility] for right knee PJI. She was admitted with concern for hematoma vs infection of right knee. She had undergone  RTKA on 12/23/20 and had declined rehab at that point. Taken to the OR on 01/06/21 and underwent polyethylene exchange with Cx+ MRSA. ID engaged and recommended  6 weeks of IV antibiotics with daptomycin followed by oral suppression for 6 months Interim: Seen by Orthopedics(Gilbert Carlis Abbott, PA) on 12/22, sutures removed and steri-strips placed.   02/08/21: Pt reports feeling well. She is accompanied by her husband. She reports erythema below right knee is consistent with erythema she experienced with left knee TKA. At this visit last  noted ESR(71) and CRP(17) on 01/27/21 are trending up->MRI if repeat labs increase. 02/05/21 ESR 45, CRP 15. Ordered MRI, not done as she reports she was told by ortho it was not necessary..She completed 6 weeks of IV antibiotics with daptomycin for OR(EOT 1/14/2). On suppressive bactrim.  Today: Pt reports feeling well. She reports left knee feels much better. She states she does have a rash on her knee that she now clotrimazole for.   Review of Systems: Review of Systems  All other systems reviewed and are negative.  Past Medical History:  Diagnosis Date   Anemia    Arthritis    Atrial fibrillation ([place])    Bleeding ulcer 12-2010  hospitalized for 1 week   Blood transfusion    Blood transfusion without reported diagnosis    2011-2 units transfused, bleeding ulcer   Bruises easily    CHF (congestive heart failure) ([place])    with PE resolved after    Complication of anesthesia    Constipation    Difficulty in swallowing 03/24/2013   no longer a problem   Diverticulitis    DVT (deep venous thrombosis) ([place]) 06/2013   right leg   Dysrhythmia     Hx of A fib    GERD (gastroesophageal reflux disease)    Hyperlipidemia    Leg swelling 03/24/2013   not a problem any longer   Osteoarthritis    Peripheral vascular disease ([facility])    Pre-diabetes    Pulmonary embolus ([place]) 06/2013   Reflux    Shortness of breath    with PE   Vomiting 03/24/2013   problem resolved  after lap band revision    Social History   Tobacco Use   Smoking status: Never   Smokeless tobacco: Never  Vaping Use   Vaping Use: Never used  Substance Use Topics   Alcohol use: No    Alcohol/week: 0.0 standard drinks   Drug use: No    Family History  Problem Relation Age of Onset   Heart disease Father    Kidney disease Father    Peripheral vascular disease Father    Heart attack Father  26   Diabetes Mother    Arthritis Mother     Allergies  Allergen Reactions   Contrast Media [Iodinated Contrast Media] Shortness Of Breath and Other (See Comments)    APNEA   Penicillin G Other (See Comments)   Ancef [Cefazolin Sodium] Nausea And Vomiting    Tolerates IV    Health Maintenance  Topic Date Due   FOOT EXAM  Never done   URINE MICROALBUMIN  Never done   Hepatitis C Screening  Never done   TETANUS/TDAP  Never done   PAP SMEAR-Modifier  Never done   MAMMOGRAM  02/11/2016   Zoster Vaccines- Shingrix (2 of 2) 03/04/2019   COVID-19 Vaccine (3 - Moderna risk series) 11/06/2019   INFLUENZA VACCINE  09/05/2020   HEMOGLOBIN A1C  06/15/2021   OPHTHALMOLOGY EXAM  08/15/2021   COLONOSCOPY (Pts 45-7yr Insurance coverage will need to be confirmed)  07/06/2026   HIV Screening  Completed   HPV VACCINES  Aged Out    Objective:  Vitals:   03/17/21 1100  BP: 120/77  Pulse: 67  Temp: 97.8 F (36.6 C)  TempSrc: Temporal   There is no height or weight on file to calculate BMI.  Physical Exam Constitutional:      Appearance: Normal appearance.  HENT:     Head: Normocephalic and atraumatic.     Right Ear: Tympanic membrane normal.     Left Ear: Tympanic membrane normal.     Nose: Nose normal.     Mouth/Throat:     Mouth: Mucous membranes are moist.  Eyes:     Extraocular Movements: Extraocular movements intact.     Conjunctiva/sclera: Conjunctivae normal.     Pupils: Pupils are equal, round, and reactive to light.  Cardiovascular:     Rate and  Rhythm: Normal rate and regular rhythm.     Heart sounds: No murmur heard.   No friction rub. No gallop.  Pulmonary:     Effort: Pulmonary effort is normal.     Breath sounds: Normal breath sounds.  Abdominal:     General: Abdomen is flat.     Palpations: Abdomen is soft.  Musculoskeletal:        General: Normal range of motion.  Skin:    General: Skin is warm and dry.     Comments: Rash from inferior indecision to back of calf  Neurological:     General: No focal deficit present.     Mental Status: She is alert and oriented to person, place, and time.  Psychiatric:        Mood and Affect: Mood normal.    Lab Results Lab Results  Component Value Date   WBC 4.6 01/13/2021   HGB 9.9 (L) 01/13/2021   HCT 32.9 (L) 01/13/2021   MCV 84.6 01/13/2021   PLT 286 01/13/2021    Lab Results  Component Value Date   CREATININE 0.55 01/12/2021   BUN 6 (L) 01/12/2021   NA 134 (L) 01/12/2021   K 3.9 01/12/2021   CL 102 01/12/2021   CO2 25 01/12/2021    Lab Results  Component Value Date   ALT 20 01/12/2021   AST 30 01/12/2021   ALKPHOS 66 01/12/2021   BILITOT 0.6 01/12/2021    No results found for: CHOL, HDL, LDLCALC, LDLDIRECT, TRIG, CHOLHDL No results found for: LABRPR, RPRTITER No results found for: HIV1RNAQUANT, HIV1RNAVL, CD4TABS   Assessment: # MRSA Right Knee PJI SP I&D and poly-exchange -Completed 6 weeks of IV antibiotics with  daptomycin for OR(EOT 02/18/21) -Knee ROM has improved -Tolerating suppressive bactrim #Rash -Right knee rash from inferior incision site started during hospitalization. She believes it may be 2/2 adhesive used. It appears fungal in nature, pt recently started clotrimazole cream.  Plan: -Labs today: cbc, cmp, esr and crp today -Continue bactrim 1 DS PO bid for chronic suppression -Pt agreeable to call clinic if rash worsens despite current treatment-consider Dermatology consult -Follow-up with ID in 3 months  Laurice Record, [place] [facility] 03/17/2021, 11:14 AM

## 2021-03-18 LAB — CBC WITH DIFFERENTIAL/PLATELET
Absolute Monocytes: 355 cells/uL (ref 200–950)
Basophils Absolute: 40 cells/uL (ref 0–200)
Basophils Relative: 0.8 %
Eosinophils Absolute: 300 cells/uL (ref 15–500)
Eosinophils Relative: 6 %
HCT: 37.8 % (ref 35.0–45.0)
Hemoglobin: 11.6 g/dL — ABNORMAL LOW (ref 11.7–15.5)
Lymphs Abs: 1665 cells/uL (ref 850–3900)
MCH: 24.1 pg — ABNORMAL LOW (ref 27.0–33.0)
MCHC: 30.7 g/dL — ABNORMAL LOW (ref 32.0–36.0)
MCV: 78.4 fL — ABNORMAL LOW (ref 80.0–100.0)
MPV: 10 fL (ref 7.5–12.5)
Monocytes Relative: 7.1 %
Neutro Abs: 2640 cells/uL (ref 1500–7800)
Neutrophils Relative %: 52.8 %
Platelets: 343 10*3/uL (ref 140–400)
RBC: 4.82 10*6/uL (ref 3.80–5.10)
RDW: 16.1 % — ABNORMAL HIGH (ref 11.0–15.0)
Total Lymphocyte: 33.3 %
WBC: 5 10*3/uL (ref 3.8–10.8)

## 2021-03-18 LAB — C-REACTIVE PROTEIN: CRP: 31.4 mg/L — ABNORMAL HIGH (ref <8.0)

## 2021-03-18 LAB — COMPLETE METABOLIC PANEL WITH GFR
AG Ratio: 1.2 (calc) (ref 1.0–2.5)
ALT: 7 U/L (ref 6–29)
AST: 14 U/L (ref 10–35)
Albumin: 4 g/dL (ref 3.6–5.1)
Alkaline phosphatase (APISO): 66 U/L (ref 37–153)
BUN: 9 mg/dL (ref 7–25)
CO2: 24 mmol/L (ref 20–32)
Calcium: 9 mg/dL (ref 8.6–10.4)
Chloride: 104 mmol/L (ref 98–110)
Creat: 0.68 mg/dL (ref 0.50–1.05)
Globulin: 3.3 g/dL (calc) (ref 1.9–3.7)
Glucose, Bld: 109 mg/dL — ABNORMAL HIGH (ref 65–99)
Potassium: 4.1 mmol/L (ref 3.5–5.3)
Sodium: 137 mmol/L (ref 135–146)
Total Bilirubin: 0.4 mg/dL (ref 0.2–1.2)
Total Protein: 7.3 g/dL (ref 6.1–8.1)
eGFR: 99 mL/min/{1.73_m2} (ref >=60)

## 2021-03-18 LAB — SEDIMENTATION RATE: Sed Rate: 41 mm/h — ABNORMAL HIGH (ref 0–30)

## 2021-03-21 ENCOUNTER — Ambulatory Visit: Payer: No Typology Code available for payment source | Admitting: Internal Medicine

## 2021-04-03 ENCOUNTER — Other Ambulatory Visit: Payer: Self-pay | Admitting: Orthopaedic Surgery

## 2021-04-10 ENCOUNTER — Encounter: Payer: Self-pay | Admitting: Orthopaedic Surgery

## 2021-05-02 ENCOUNTER — Other Ambulatory Visit: Payer: Self-pay

## 2021-05-02 ENCOUNTER — Ambulatory Visit (INDEPENDENT_AMBULATORY_CARE_PROVIDER_SITE_OTHER): Payer: No Typology Code available for payment source | Admitting: Orthopaedic Surgery

## 2021-05-02 ENCOUNTER — Other Ambulatory Visit: Payer: Self-pay | Admitting: Orthopaedic Surgery

## 2021-05-02 ENCOUNTER — Encounter: Payer: Self-pay | Admitting: Orthopaedic Surgery

## 2021-05-02 ENCOUNTER — Telehealth: Payer: Self-pay | Admitting: Orthopaedic Surgery

## 2021-05-02 DIAGNOSIS — T8453XD Infection and inflammatory reaction due to internal right knee prosthesis, subsequent encounter: Secondary | ICD-10-CM | POA: Diagnosis not present

## 2021-05-02 NOTE — Progress Notes (Signed)
[name] continues to follow-up as a relates to a MRSA infection in the right total knee arthroplasty.  She is started develop draining.  Her recent infectious parameter labs in February were all elevated.  She has a follow-up appointment with the regional infectious disease clinic in [place] soon and they are recommending surgery.  She has lost 60 pounds. ? ?Examination her right knee does show an inferior wound that is draining.  There is some redness associated with this.  She is not febrile and appears comfortable overall. ? ?She will continue to follow-up in [place].  I am happy to see her in the postoperative period if it is more convenient for her to be seen appear.  I gave her a letter recommending long-term disability.  She I do not see her being able to work for at least the next 12 months given this active infection in her knee and the surgeries that her knee will require.  She is not going to be able to work in any capacity over these next 12 months. ?

## 2021-05-02 NOTE — Telephone Encounter (Signed)
Pt submitted 2 medical release forms, FMLA forms, short term forms, and $ 50.00 cash payment to [employer]. Accepted 05/02/21 ?

## 2021-05-24 DIAGNOSIS — M25561 Pain in right knee: Secondary | ICD-10-CM | POA: Insufficient documentation

## 2021-06-01 DIAGNOSIS — Z9289 Personal history of other medical treatment: Secondary | ICD-10-CM | POA: Insufficient documentation

## 2021-06-01 DIAGNOSIS — D649 Anemia, unspecified: Secondary | ICD-10-CM | POA: Insufficient documentation

## 2021-06-01 DIAGNOSIS — J45909 Unspecified asthma, uncomplicated: Secondary | ICD-10-CM | POA: Insufficient documentation

## 2021-06-01 HISTORY — DX: Unspecified asthma, uncomplicated: J45.909

## 2021-06-16 ENCOUNTER — Ambulatory Visit: Payer: No Typology Code available for payment source | Admitting: Internal Medicine

## 2021-07-03 ENCOUNTER — Other Ambulatory Visit: Payer: Self-pay | Admitting: Orthopaedic Surgery

## 2021-07-04 ENCOUNTER — Other Ambulatory Visit: Payer: Self-pay | Admitting: Orthopaedic Surgery

## 2021-08-10 ENCOUNTER — Other Ambulatory Visit: Payer: Self-pay | Admitting: Cardiovascular Disease

## 2021-09-07 ENCOUNTER — Telehealth: Payer: Self-pay | Admitting: Orthopaedic Surgery

## 2021-09-07 NOTE — Telephone Encounter (Signed)
Spoke with patient she asked if Dr. Ninfa Linden would refer her for (PT) at [facility]? The number to contact patient is [phone]

## 2021-09-08 ENCOUNTER — Other Ambulatory Visit: Payer: Self-pay

## 2021-09-08 DIAGNOSIS — Z96651 Presence of right artificial knee joint: Secondary | ICD-10-CM

## 2021-09-08 DIAGNOSIS — T8453XD Infection and inflammatory reaction due to internal right knee prosthesis, subsequent encounter: Secondary | ICD-10-CM

## 2021-09-08 NOTE — Telephone Encounter (Signed)
Referral sent 

## 2021-09-28 ENCOUNTER — Ambulatory Visit (INDEPENDENT_AMBULATORY_CARE_PROVIDER_SITE_OTHER): Payer: No Typology Code available for payment source | Admitting: Orthopedic Surgery

## 2021-09-28 ENCOUNTER — Ambulatory Visit (INDEPENDENT_AMBULATORY_CARE_PROVIDER_SITE_OTHER): Payer: No Typology Code available for payment source

## 2021-09-28 DIAGNOSIS — M545 Low back pain, unspecified: Secondary | ICD-10-CM

## 2021-09-28 DIAGNOSIS — M48061 Spinal stenosis, lumbar region without neurogenic claudication: Secondary | ICD-10-CM

## 2021-09-28 DIAGNOSIS — G8929 Other chronic pain: Secondary | ICD-10-CM | POA: Diagnosis not present

## 2021-09-28 MED ORDER — CELECOXIB 200 MG PO CAPS: 200.0000 mg | ORAL_CAPSULE | Freq: Every day | ORAL | 3 refills | Status: DC

## 2021-10-02 ENCOUNTER — Other Ambulatory Visit: Payer: Self-pay | Admitting: Orthopaedic Surgery

## 2021-10-02 ENCOUNTER — Other Ambulatory Visit: Payer: Self-pay | Admitting: Cardiovascular Disease

## 2021-10-03 ENCOUNTER — Encounter: Payer: Self-pay | Admitting: Orthopedic Surgery

## 2021-10-03 NOTE — Progress Notes (Signed)
Office Visit Note   Patient: [name]           Date of Birth: [birth year]           MRN: [id number] Visit Date: 09/28/2021              Requested by: Mateo Flow, MD [address] [place],  [place] [address] PCP: Mateo Flow, MD  Chief Complaint  Patient presents with   Lower Back - Pain      HPI: Patient is a 61 year old woman who states she has been having left back and buttocks pain since her right total knee arthroplasty.  She currently ambulates with crutches.  She denies any radicular pain down the legs.  She states she has less pain with sitting worse with activities.  Assessment & Plan: Visit Diagnoses:  1. Chronic left-sided low back pain without sciatica     Plan: Patient states that she has had the best relief with Celebrex a prescription is sent then.  Patient is set up for physical therapy next week.  Follow-Up Instructions: Return in about 4 weeks (around 10/26/2021).   Ortho Exam  Patient is alert, oriented, no adenopathy, well-dressed, normal affect, normal respiratory effort. Examination patient has left-sided lower back pain with left buttocks pain.  Pain does not radiate down her legs she has a negative sciatic stretch test.  She has no focal motor weakness.  Patient feels better sitting and when she is walking she leans forward on a cart consistent with spinal stenosis.  Imaging: No results found. No images are attached to the encounter.  Labs: Lab Results  Component Value Date   HGBA1C 5.2 12/16/2020   HGBA1C <4.2 (L) 01/25/2016   ESRSEDRATE 41 (H) 03/17/2021   ESRSEDRATE 45 (H) 02/16/2021   ESRSEDRATE 47 (H) 01/11/2021   CRP 31.4 (H) 03/17/2021   CRP 15.2 (H) 02/16/2021   CRP 5.5 (H) 01/11/2021   REPTSTATUS 01/12/2021 FINAL 01/06/2021   GRAMSTAIN  01/06/2021    MODERATE WBC PRESENT, PREDOMINANTLY MONONUCLEAR FEW GRAM POSITIVE COCCI    CULT  01/06/2021    MODERATE METHICILLIN RESISTANT STAPHYLOCOCCUS AUREUS NO ANAEROBES  ISOLATED Performed at [facility], [place] [address] [place], [place] [address]    LABORGA METHICILLIN RESISTANT STAPHYLOCOCCUS AUREUS 01/06/2021     Lab Results  Component Value Date   ALBUMIN 2.3 (L) 01/12/2021   ALBUMIN 2.1 (L) 01/03/2021   ALBUMIN 3.4 (L) 12/14/2018    No results found for: "MG" No results found for: "VD25OH"  No results found for: "PREALBUMIN"    Latest Ref Rng & Units 03/17/2021   11:39 AM 01/13/2021    4:38 AM 01/12/2021    4:17 AM  CBC EXTENDED  WBC 3.8 - 10.8 Thousand/uL 5.0  4.6  5.7   RBC 3.80 - 5.10 Million/uL 4.82  3.89  3.98   Hemoglobin 11.7 - 15.5 g/dL 11.6  9.9  10.0   HCT 35.0 - 45.0 % 37.8  32.9  33.1   Platelets 140 - 400 Thousand/uL 343  286  306   NEUT# 1,500 - 7,800 cells/uL 2,640  2.6  3.2   Lymph# 850 - 3,900 cells/uL 1,665  1.2  1.6      There is no height or weight on file to calculate BMI.  Orders:  Orders Placed This Encounter  Procedures   XR Lumbar Spine 2-3 Views   Meds ordered this encounter  Medications   celecoxib (CELEBREX) 200 MG capsule  Sig: Take 1 capsule (200 mg total) by mouth daily.    Dispense:  60 capsule    Refill:  3     Procedures: No procedures performed  Clinical Data: No additional findings.  ROS:  All other systems negative, except as noted in the HPI. Review of Systems  Objective: Vital Signs: There were no vitals taken for this visit.  Specialty Comments:  No specialty comments available.  PMFS History: Patient Active Problem List   Diagnosis Date Noted   Tachypnea 01/03/2021   Cellulitis of right knee 01/03/2021   Chronic anticoagulation 01/03/2021   Status post total right knee replacement 12/23/2020   Unilateral primary osteoarthritis, right knee 04/07/2020   Leg edema 08/19/2018   Presence of left artificial knee joint 08/14/2016   Unilateral primary osteoarthritis, left knee 07/31/2016   Status post total knee replacement, left 07/31/2016   Abdominal hematoma     Post-operative state    Paroxysmal atrial fibrillation ([place])    Controlled type 2 diabetes mellitus with diabetic nephropathy ([place])    Wound infection 01/25/2016   Hydronephrosis, left 01/25/2016   Hydronephrosis, right 01/25/2016   DVT (deep venous thrombosis) ([place]) 01/25/2016   Atrial fibrillation ([place]) 01/25/2016   Diabetes mellitus with complication ([place])    Wound infection after surgery, initial encounter    Varicose veins of lower extremities with complications 01/75/1025   Pain and swelling of left lower leg 10/27/2013   Pain of left lower extremity 10/27/2013   Gastroenteritis, acute 09/08/2013   Fever, unspecified 09/04/2013   ASD (atrial septal defect) 08/13/2013   Pulmonary embolism ([place]) 06/22/2013   Dyspnea 06/11/2013   Anterior slip repaired Oct 2012 01/18/2011   GI bleed 12/18/2010   Diabetes mellitus ([place]) 12/18/2010   Varicose veins of lower extremities with other complications 85/27/7824   Diabetes mellitus    HIP PAIN, RIGHT 03/06/2010   Past Medical History:  Diagnosis Date   Anemia    Arthritis    Atrial fibrillation ([place])    Bleeding ulcer 12-2010  hospitalized for 1 week   Blood transfusion    Blood transfusion without reported diagnosis    2011-2 units transfused, bleeding ulcer   Bruises easily    CHF (congestive heart failure) ([place])    with PE resolved after    Complication of anesthesia    Constipation    Difficulty in swallowing 03/24/2013   no longer a problem   Diverticulitis    DVT (deep venous thrombosis) ([place]) 06/2013   right leg   Dysrhythmia     Hx of A fib    GERD (gastroesophageal reflux disease)    Hyperlipidemia    Leg swelling 03/24/2013   not a problem any longer   Osteoarthritis    Peripheral vascular disease ([facility])    Pre-diabetes    Pulmonary embolus ([place]) 06/2013   Reflux    Shortness of breath    with PE   Vomiting 03/24/2013   problem resolved after lap band revision    Family History  Problem Relation Age  of Onset   Heart disease Father    Kidney disease Father    Peripheral vascular disease Father    Heart attack Father        57   Diabetes Mother    Arthritis Mother     Past Surgical History:  Procedure Laterality Date   APPENDECTOMY     CARPEL TUNNEL BILATEREAL     CHOLECYSTECTOMY     5'12   COLONOSCOPY  N/A 04/10/2013   Procedure: COLONOSCOPY;  Surgeon: Beryle Beams, MD;  Location: [facility];  Service: Endoscopy;  Laterality: N/A;   DENTAL SURGERY  10/2019   tooth implant   DIAGNOSTIC LAPAROSCOPY     ENDOVENOUS ABLATION SAPHENOUS VEIN W/ LASER  06/2008   ENDOVENOUS ABLATION SAPHENOUS VEIN W/ LASER Left 12/09/2013   EVLA LEFT GREATER SAPHENOUS VEIN BY TODD EARLY MD   ESOPHAGOGASTRODUODENOSCOPY  12/18/2010   Procedure: ESOPHAGOGASTRODUODENOSCOPY (EGD);  Surgeon: Gatha Mayer, MD;  Location: [facility];  Service: Endoscopy;  Laterality: N/A;   I & D KNEE WITH POLY EXCHANGE Right 01/06/2021   Procedure: IRRIGATION AND DEBRIDEMENT RIGHT KNEE WITH  POLY EXCHANGE;  Surgeon: [name], MD;  Location: [facility];  Service: Orthopedics;  Laterality: Right;   JOINT REPLACEMENT Left    knee   LAPAROSCOPIC GASTRIC BANDING  01/05/2008   LAPAROSCOPIC GASTRIC BANDING  12/18/2010   01/04/2009   SALPHINGOOOPHORECTOMY Right    cyst removal   TONSILLECTOMY     TOTAL KNEE ARTHROPLASTY Left 07/31/2016   Procedure: LEFT TOTAL KNEE ARTHROPLASTY;  Surgeon: [name], MD;  Location: [facility];  Service: Orthopedics;  Laterality: Left;   TOTAL KNEE ARTHROPLASTY Right 12/23/2020   Procedure: RIGHT TOTAL KNEE ARTHROPLASTY;  Surgeon: [name], MD;  [facility];  Service: Orthopedics;  Laterality: Right;   WEDGE RESECTION OF OVARY  LEFT OVARY    Social History   Occupational History   Occupation: Programmer, multimedia: [employer]  Tobacco Use   Smoking status: Never   Smokeless tobacco: Never  Vaping Use   Vaping Use: Never used  Substance and Sexual  Activity   Alcohol use: No    Alcohol/week: 0.0 standard drinks of alcohol   Drug use: No   Sexual activity: Yes    Partners: Male    Birth control/protection: None

## 2021-10-05 ENCOUNTER — Ambulatory Visit (INDEPENDENT_AMBULATORY_CARE_PROVIDER_SITE_OTHER): Payer: No Typology Code available for payment source | Admitting: Physical Therapy

## 2021-10-05 ENCOUNTER — Encounter: Payer: Self-pay | Admitting: Physical Therapy

## 2021-10-05 DIAGNOSIS — M5459 Other low back pain: Secondary | ICD-10-CM

## 2021-10-05 DIAGNOSIS — R2681 Unsteadiness on feet: Secondary | ICD-10-CM | POA: Diagnosis not present

## 2021-10-05 DIAGNOSIS — M6281 Muscle weakness (generalized): Secondary | ICD-10-CM

## 2021-10-05 DIAGNOSIS — R262 Difficulty in walking, not elsewhere classified: Secondary | ICD-10-CM | POA: Diagnosis not present

## 2021-10-05 NOTE — Therapy (Signed)
OUTPATIENT PHYSICAL THERAPY LOWER EXTREMITY EVALUATION   Patient Name: [name] MRN: [id number] DOB:1960-07-26, 62 y.o., female Today's Date: 10/05/2021   PT End of Session - 10/05/21 1525     Visit Number 1    Number of Visits 21    Date for PT Re-Evaluation 12/14/21    Authorization Type [employer]    Authorization Time Period 10/05/21 to 12/14/21    PT Start Time 1432    PT Stop Time 1515    PT Time Calculation (min) 43 min    Activity Tolerance Patient tolerated treatment well    Behavior During Therapy [facility] for tasks assessed/performed             Past Medical History:  Diagnosis Date   Anemia    Arthritis    Atrial fibrillation ([place])    Bleeding ulcer 12-2010  hospitalized for 1 week   Blood transfusion    Blood transfusion without reported diagnosis    2011-2 units transfused, bleeding ulcer   Bruises easily    CHF (congestive heart failure) ([place])    with PE resolved after    Complication of anesthesia    Constipation    Difficulty in swallowing 03/24/2013   no longer a problem   Diverticulitis    DVT (deep venous thrombosis) ([place]) 06/2013   right leg   Dysrhythmia     Hx of A fib    GERD (gastroesophageal reflux disease)    Hyperlipidemia    Leg swelling 03/24/2013   not a problem any longer   Osteoarthritis    Peripheral vascular disease ([place])    Pre-diabetes    Pulmonary embolus ([place]) 06/2013   Reflux    Shortness of breath    with PE   Vomiting 03/24/2013   problem resolved after lap band revision   Past Surgical History:  Procedure Laterality Date   APPENDECTOMY     [place]     CHOLECYSTECTOMY     2'67   COLONOSCOPY N/A 04/10/2013   Procedure: COLONOSCOPY;  Surgeon: Beryle Beams, MD;  [facility];  Service: Endoscopy;  Laterality: N/A;   DENTAL SURGERY  10/2019   tooth implant   DIAGNOSTIC LAPAROSCOPY     ENDOVENOUS ABLATION SAPHENOUS VEIN W/ LASER  06/2008   ENDOVENOUS ABLATION SAPHENOUS VEIN W/ LASER  Left 12/09/2013   EVLA LEFT GREATER SAPHENOUS VEIN BY TODD EARLY MD   ESOPHAGOGASTRODUODENOSCOPY  12/18/2010   Procedure: ESOPHAGOGASTRODUODENOSCOPY (EGD);  Surgeon: Gatha Mayer, MD;  Location: [facility];  Service: Endoscopy;  Laterality: N/A;   I & D KNEE WITH POLY EXCHANGE Right 01/06/2021   Procedure: IRRIGATION AND DEBRIDEMENT RIGHT KNEE WITH  POLY EXCHANGE;  Surgeon: [name], MD;  Location: [facility];  Service: Orthopedics;  Laterality: Right;   JOINT REPLACEMENT Left    knee   LAPAROSCOPIC GASTRIC BANDING  01/05/2008   LAPAROSCOPIC GASTRIC BANDING  12/18/2010   01/04/2009   SALPHINGOOOPHORECTOMY Right    cyst removal   TONSILLECTOMY     TOTAL KNEE ARTHROPLASTY Left 07/31/2016   Procedure: LEFT TOTAL KNEE ARTHROPLASTY;  Surgeon: [name], MD;  Location: [facility];  Service: Orthopedics;  Laterality: Left;   TOTAL KNEE ARTHROPLASTY Right 12/23/2020   Procedure: RIGHT TOTAL KNEE ARTHROPLASTY;  Surgeon: [name], MD;  [facility];  Service: Orthopedics;  Laterality: Right;   WEDGE RESECTION OF OVARY  LEFT OVARY    Patient Active Problem List   Diagnosis Date Noted  Tachypnea 01/03/2021   Cellulitis of right knee 01/03/2021   Chronic anticoagulation 01/03/2021   Status post total right knee replacement 12/23/2020   Unilateral primary osteoarthritis, right knee 04/07/2020   Leg edema 08/19/2018   Presence of left artificial knee joint 08/14/2016   Unilateral primary osteoarthritis, left knee 07/31/2016   Status post total knee replacement, left 07/31/2016   Abdominal hematoma    Post-operative state    Paroxysmal atrial fibrillation ([place])    Controlled type 2 diabetes mellitus with diabetic nephropathy ([place])    Wound infection 01/25/2016   Hydronephrosis, left 01/25/2016   Hydronephrosis, right 01/25/2016   DVT (deep venous thrombosis) ([place]) 01/25/2016   Atrial fibrillation ([place]) 01/25/2016   Diabetes mellitus with complication  ([place])    Wound infection after surgery, initial encounter    Varicose veins of lower extremities with complications 30/16/0109   Pain and swelling of left lower leg 10/27/2013   Pain of left lower extremity 10/27/2013   Gastroenteritis, acute 09/08/2013   Fever, unspecified 09/04/2013   ASD (atrial septal defect) 08/13/2013   Pulmonary embolism ([place]) 06/22/2013   Dyspnea 06/11/2013   Anterior slip repaired Oct 2012 01/18/2011   GI bleed 12/18/2010   Diabetes mellitus ([place]) 12/18/2010   Varicose veins of lower extremities with other complications 32/35/5732   Diabetes mellitus    HIP PAIN, RIGHT 03/06/2010    PCP: Bertram Millard MD   REFERRING PROVIDER: [name], MD   REFERRING DIAG: [phone] (ICD-10-CM) - Infection of total right knee replacement, subsequent encounter Z96.651 (ICD-10-CM) - Status post total right knee replacement   THERAPY DIAG:  Muscle weakness (generalized)  Unsteadiness on feet  Difficulty in walking, not elsewhere classified  Other low back pain  Rationale for Evaluation and Treatment Rehabilitation  ONSET DATE: 09/08/2021   SUBJECTIVE:   SUBJECTIVE STATEMENT:  I had a TKR in November and got MRSA; I had a second surgery at [facility] in early December, then 2 additional surgeries in [place] for the knee. We believe the MRSA is gone. I can't walk, I have some back pain and I can't walk. I want to be able to walk again without crutches or a walker. I have 1 STE my house and I can manage that OK. The back pain is considerable, the leg weakness is considerable and I did spend a lot of time in bed with my surgery- they took the knee out of my leg and actually left it out for a couple of days. Balance is not good, am not able to use a cane yet. No falls recently but I have had some close calls, usually this happens when I try to walk without my crutches.   PERTINENT HISTORY: [name] continues to follow-up as a relates to a MRSA infection in the right  total knee arthroplasty. She is started develop draining. Her recent infectious parameter labs in February were all elevated. She has a follow-up appointment with the regional infectious disease clinic in [place] soon and they are recommending surgery. She has lost 60 pounds.  Examination her right knee does show an inferior wound that is draining. There is some redness associated with this. She is not febrile and appears comfortable overall.  She will continue to follow-up in [place]. I am happy to see her in the postoperative period if it is more convenient for her to be seen appear. I gave her a letter recommending long-term disability. She I do not see her being able to work for at least  the next 12 months given this active infection in her knee and the surgeries that her knee will require. She is not going to be able to work in any capacity over these next 12 months.   PAIN:  Are you having pain? Yes: NPRS scale: 2/10; at worst can be very intense 7-8/10 and gets better quickly with seated rest  Pain location: L sided back pain and R knee pain  Pain description: sharp   Aggravating factors: everything but sitting  Relieving factors: seated rest   PRECAUTIONS: None  WEIGHT BEARING RESTRICTIONS No  FALLS:  Has patient fallen in last 6 months? 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   PLOF: Independent, Independent with basic ADLs, Independent with gait, and Independent with transfers  PATIENT GOALS improve function in general, be able to walk without crutches, decrease pain    OBJECTIVE:   DIAGNOSTIC FINDINGS:   PATIENT SURVEYS:  FOTO 34.8  COGNITION:  Overall cognitive status: Within functional limits for tasks  assessed     SENSATION:   EDEMA:    MUSCLE LENGTH: Hamstrings WNL, Piriformis moderate limitation L severe limitation R   POSTURE: rounded shoulders, forward head, decreased lumbar lordosis, decreased thoracic kyphosis, and flexed trunk   PALPATION: L lumbar paraspinals TTP, more tense than the R   LOWER EXTREMITY ROM:  Active ROM Right eval Left eval  Hip flexion    Hip extension    Hip abduction    Hip adduction    Hip internal rotation    Hip external rotation    Knee flexion 101 105  Knee extension 4 2  Ankle dorsiflexion    Ankle plantarflexion    Ankle inversion    Ankle eversion     (Blank rows = not tested)  Lumbar ROM flexion WNL, extension severe limitation, lateral flexion moderate limitation   LOWER EXTREMITY MMT:  MMT Right eval Left eval  Hip flexion 3- 3  Hip extension    Hip abduction 3- strong compensation with hip flexors  3- strong compensation with hip flexors  Hip adduction    Hip internal rotation    Hip external rotation    Knee flexion 4+ 4+  Knee extension 4+ 4+  Ankle dorsiflexion 5 5  Ankle plantarflexion    Ankle inversion    Ankle eversion     (Blank rows = not tested)  LOWER EXTREMITY SPECIAL TESTS:    FUNCTIONAL TESTS:  5 times sit to stand: 21 seconds no UEs  Timed up and go (TUG): 16 seconds B crutches   GAIT: Distance walked: in clinic distances  Assistive device utilized: Crutches Level of assistance: Modified independence Comments: reciprocal pattern with B crutches, flexed at hips, wide BOS     TODAY'S TREATMENT:  Nustep L3 BLEs only x6 minutes    PATIENT EDUCATION:  Education details: exam findings, POC, HEP  Person educated: Patient Education method: Explanation, Demonstration, and Handouts Education comprehension: verbalized understanding and returned demonstration   HOME EXERCISE PROGRAM:  [id number]  ASSESSMENT:  CLINICAL IMPRESSION: Patient is a 61 y.o. F who was seen today for physical  therapy evaluation and treatment for difficulty with mobility after experiencing a complicated recovery from TKR surgery/MRSA infection. Exam reveals significant proximal mm weakness, postural impairments, functional  muscle tightness, muscle spasms in lumbar region, gait and balance impairment, and poor core strength. I do believe that changes in mobility status after her course of care with her TKR has likely caused gross deconditioning and changes in mobility leading to her increased back pain. Will benefit from skilled PT services to address functional limitations, reduce pain, and optimize overall level of function.    OBJECTIVE IMPAIRMENTS Abnormal gait, decreased balance, decreased endurance, decreased knowledge of use of DME, decreased mobility, difficulty walking, decreased ROM, decreased strength, increased fascial restrictions, increased muscle spasms, impaired flexibility, postural dysfunction, obesity, and pain.   ACTIVITY LIMITATIONS carrying, lifting, standing, squatting, stairs, transfers, and locomotion level  PARTICIPATION LIMITATIONS: meal prep, cleaning, laundry, interpersonal relationship, driving, shopping, community activity, occupation, and yard work  PERSONAL FACTORS Age, Behavior pattern, Fitness, Past/current experiences, and Time since onset of injury/illness/exacerbation are also affecting patient's functional outcome.   REHAB POTENTIAL: Good  CLINICAL DECISION MAKING: Evolving/moderate complexity  EVALUATION COMPLEXITY: Moderate   GOALS: Goals reviewed with patient? Yes  SHORT TERM GOALS: Target date: 11/09/2021  Will be compliant with appropriate progressive HEP  Baseline: Goal status: INITIAL  2.  Will have better understanding of posture and ergonomics  Baseline:  Goal status: INITIAL  3.  Will be able to ambulate up to 328f with U crutch or SPC without unsteadiness or increased pain  Baseline:  Goal status: INITIAL  4.  Lumbar ROM to be minimally  impaired on all planes of motion  Baseline:  Goal status: INITIAL   LONG TERM GOALS: Target date: 12/14/2021   MMT to improve by at least 1 grade in all weak groups  Baseline:  Goal status: INITIAL  2.  Will be able to complete 5x sit to stand in 15 seconds or less without UEs  Baseline:  Goal status: INITIAL  3.  Will be able to complete TUG in 12 seconds or less with LRAD  Baseline:  Goal status: INITIAL  4.  Will be able to ambulate unlimited community distances with LRAD and back pain no more than 2/10 Baseline:  Goal status: INITIAL  5.  Will be able to ambulate distances of 200-3051fwith no device and no increased pain or unsteadiness  Baseline:  Goal status: INITIAL  6.  Will tolerate return to work for at least a 4 hour shift without increase in pain  Baseline:  Goal status: INITIAL   PLAN: PT FREQUENCY: 2x/week  PT DURATION: 10 weeks  PLANNED INTERVENTIONS: Therapeutic exercises, Therapeutic activity, Neuromuscular re-education, Balance training, Gait training, Patient/Family education, Self Care, Joint mobilization, Stair training, DME instructions, Aquatic Therapy, Dry Needling, Electrical stimulation, Spinal mobilization, Cryotherapy, Moist heat, Taping, Vasopneumatic device, Ultrasound, Ionotophoresis '4mg'$ /ml Dexamethasone, Manual therapy, and Re-evaluation  PLAN FOR NEXT SESSION: proximal hip and core strength, general conditioning, balance training   Sheresa Cullop U PT DPT PN2  10/05/2021, 3:27 PM

## 2021-10-18 ENCOUNTER — Ambulatory Visit (INDEPENDENT_AMBULATORY_CARE_PROVIDER_SITE_OTHER): Payer: No Typology Code available for payment source | Admitting: Rehabilitative and Restorative Service Providers"

## 2021-10-18 ENCOUNTER — Encounter: Payer: Self-pay | Admitting: Rehabilitative and Restorative Service Providers"

## 2021-10-18 DIAGNOSIS — R2681 Unsteadiness on feet: Secondary | ICD-10-CM | POA: Diagnosis not present

## 2021-10-18 DIAGNOSIS — R262 Difficulty in walking, not elsewhere classified: Secondary | ICD-10-CM

## 2021-10-18 DIAGNOSIS — M5459 Other low back pain: Secondary | ICD-10-CM | POA: Diagnosis not present

## 2021-10-18 DIAGNOSIS — M6281 Muscle weakness (generalized): Secondary | ICD-10-CM | POA: Diagnosis not present

## 2021-10-18 NOTE — Therapy (Signed)
OUTPATIENT PHYSICAL THERAPY TREATMENT NOTE   Patient Name: [name] MRN: [id number] DOB:Jul 07, 1960, 61 y.o., female Today's Date: 10/18/2021  PCP: Bertram Millard MD  REFERRING PROVIDER: [name], MD   END OF SESSION:   PT End of Session - 10/18/21 1337     Visit Number 2    Number of Visits 21    Date for PT Re-Evaluation 12/14/21    Authorization Type [employer]    Authorization Time Period 10/05/21 to 12/14/21    PT Start Time 1301    PT Stop Time 1342    PT Time Calculation (min) 41 min    Activity Tolerance Patient tolerated treatment well    Behavior During Therapy [facility] for tasks assessed/performed             Past Medical History:  Diagnosis Date   Anemia    Arthritis    Atrial fibrillation ([place])    Bleeding ulcer 12-2010  hospitalized for 1 week   Blood transfusion    Blood transfusion without reported diagnosis    2011-2 units transfused, bleeding ulcer   Bruises easily    CHF (congestive heart failure) ([place])    with PE resolved after    Complication of anesthesia    Constipation    Difficulty in swallowing 03/24/2013   no longer a problem   Diverticulitis    DVT (deep venous thrombosis) ([place]) 06/2013   right leg   Dysrhythmia     Hx of A fib    GERD (gastroesophageal reflux disease)    Hyperlipidemia    Leg swelling 03/24/2013   not a problem any longer   Osteoarthritis    Peripheral vascular disease ([place])    Pre-diabetes    Pulmonary embolus ([place]) 06/2013   Reflux    Shortness of breath    with PE   Vomiting 03/24/2013   problem resolved after lap band revision   Past Surgical History:  Procedure Laterality Date   APPENDECTOMY     [place]     CHOLECYSTECTOMY     2'94   COLONOSCOPY N/A 04/10/2013   Procedure: COLONOSCOPY;  Surgeon: Beryle Beams, MD;  Location: [facility];  Service: Endoscopy;  Laterality: N/A;   DENTAL SURGERY  10/2019   tooth implant   DIAGNOSTIC LAPAROSCOPY     ENDOVENOUS ABLATION  SAPHENOUS VEIN W/ LASER  06/2008   ENDOVENOUS ABLATION SAPHENOUS VEIN W/ LASER Left 12/09/2013   EVLA LEFT GREATER SAPHENOUS VEIN BY TODD EARLY MD   ESOPHAGOGASTRODUODENOSCOPY  12/18/2010   Procedure: ESOPHAGOGASTRODUODENOSCOPY (EGD);  Surgeon: Gatha Mayer, MD;  Location: [facility];  Service: Endoscopy;  Laterality: N/A;   I & D KNEE WITH POLY EXCHANGE Right 01/06/2021   Procedure: IRRIGATION AND DEBRIDEMENT RIGHT KNEE WITH  POLY EXCHANGE;  Surgeon: [name], MD;  Location: [place];  Service: Orthopedics;  Laterality: Right;   JOINT REPLACEMENT Left    knee   LAPAROSCOPIC GASTRIC BANDING  01/05/2008   LAPAROSCOPIC GASTRIC BANDING  12/18/2010   01/04/2009   SALPHINGOOOPHORECTOMY Right    cyst removal   TONSILLECTOMY     TOTAL KNEE ARTHROPLASTY Left 07/31/2016   Procedure: LEFT TOTAL KNEE ARTHROPLASTY;  Surgeon: [name], MD;  Location: [place];  Service: Orthopedics;  Laterality: Left;   TOTAL KNEE ARTHROPLASTY Right 12/23/2020   Procedure: RIGHT TOTAL KNEE ARTHROPLASTY;  Surgeon: [name], MD;  Location: [facility];  Service: Orthopedics;  Laterality: Right;   WEDGE RESECTION OF  OVARY  LEFT OVARY    Patient Active Problem List   Diagnosis Date Noted   Tachypnea 01/03/2021   Cellulitis of right knee 01/03/2021   Chronic anticoagulation 01/03/2021   Status post total right knee replacement 12/23/2020   Unilateral primary osteoarthritis, right knee 04/07/2020   Leg edema 08/19/2018   Presence of left artificial knee joint 08/14/2016   Unilateral primary osteoarthritis, left knee 07/31/2016   Status post total knee replacement, left 07/31/2016   Abdominal hematoma    Post-operative state    Paroxysmal atrial fibrillation ([place])    Controlled type 2 diabetes mellitus with diabetic nephropathy ([place])    Wound infection 01/25/2016   Hydronephrosis, left 01/25/2016   Hydronephrosis, right 01/25/2016   DVT (deep venous thrombosis) ([place]) 01/25/2016    Atrial fibrillation ([place]) 01/25/2016   Diabetes mellitus with complication ([place])    Wound infection after surgery, initial encounter    Varicose veins of lower extremities with complications 61/44/3154   Pain and swelling of left lower leg 10/27/2013   Pain of left lower extremity 10/27/2013   Gastroenteritis, acute 09/08/2013   Fever, unspecified 09/04/2013   ASD (atrial septal defect) 08/13/2013   Pulmonary embolism ([place]) 06/22/2013   Dyspnea 06/11/2013   Anterior slip repaired Oct 2012 01/18/2011   GI bleed 12/18/2010   Diabetes mellitus ([place]) 12/18/2010   Varicose veins of lower extremities with other complications 00/86/7619   Diabetes mellitus    HIP PAIN, RIGHT 03/06/2010    REFERRING DIAG: T84.53XD (ICD-10-CM) - Infection of total right knee replacement, subsequent encounter Z96.651 (ICD-10-CM) - Status post total right knee replacement   THERAPY DIAG:  Muscle weakness (generalized)  Unsteadiness on feet  Difficulty in walking, not elsewhere classified  Other low back pain  Rationale for Evaluation and Treatment Rehabilitation  ONSET DATE: 09/08/2021    SUBJECTIVE:    SUBJECTIVE STATEMENT: She has been doing pretty good. She has been doing her exercises, feeling that they are going better. She has been taking celebrex more frequently, and took a tramadol last night after going to the gym yesterday and doing the seated elliptical for 15 minutes and feeling that she overdid it. [facility] and [facility] well. She does seated marching without the band, standing at the counter, and in the pool. She is limited by the seated abduction in frequency with the band. She does the pelvic tilts especially to relieve back pain. She uses the crutches outside of the home, and inside the home typically walks with reaching for furniture or using just the Lt crutch.   PERTINENT HISTORY: follow-up as a relates to a MRSA infection in the right total  knee arthroplasty.    PAIN:  Are you having pain? Yes: NPRS scale: 1-2/10; last night was 7-8/10 and interrupted sleep Pain location: Lt sided back pain and R knee pain  Pain description: sharp   Aggravating factors: everything but sitting  Relieving factors: seated rest    PRECAUTIONS: None   WEIGHT BEARING RESTRICTIONS No   FALLS:  Has patient fallen in last 6 months? 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    PLOF: Independent, Independent with basic ADLs, Independent with gait, and Independent with transfers  PATIENT GOALS improve function in general, be able to walk without crutches, decrease pain      OBJECTIVE:    PATIENT SURVEYS:  10/05/21 FOTO 34.8   COGNITION: 10/05/21           Overall cognitive status: Within functional limits for tasks assessed                          SENSATION:     EDEMA:      MUSCLE LENGTH: 10/05/21 Hamstrings WNL, Piriformis moderate limitation L severe limitation R    POSTURE:  10/05/21 rounded shoulders, forward head, decreased lumbar lordosis, decreased thoracic kyphosis, and flexed trunk    PALPATION: 10/05/21 Lt lumbar paraspinals TTP, more tense than the R    LOWER EXTREMITY ROM:   Active ROM Right 10/05/21 Left 10/05/21  Hip flexion      Hip extension      Hip abduction      Hip adduction      Hip internal rotation      Hip external rotation      Knee flexion 101 105  Knee extension 4 2  Ankle dorsiflexion      Ankle plantarflexion      Ankle inversion      Ankle eversion       (Blank rows = not tested)   10/05/21 Lumbar ROM flexion WNL, extension severe limitation, lateral flexion moderate limitation    LOWER EXTREMITY MMT:   MMT Right 10/05/21 Left 10/05/21   Hip flexion 3- 3  Hip extension      Hip abduction 3- strong compensation with hip flexors  3- strong compensation with hip flexors  Hip adduction      Hip internal rotation      Hip external rotation      Knee flexion 4+ 4+  Knee extension 4+ 4+  Ankle dorsiflexion 5 5  Ankle plantarflexion      Ankle inversion      Ankle eversion       (Blank rows = not tested)   LOWER EXTREMITY SPECIAL TESTS:      FUNCTIONAL TESTS:  10/05/21 5 times sit to stand: 21 seconds no UEs  Timed up and go (TUG): 16 seconds B crutches    GAIT: 10/18/21 Pt amb in clinic with Lt crutch with no losses of balance or instability  10/05/21 Distance walked: in clinic distances  Assistive device utilized: Crutches Level of assistance: Modified independence Comments: reciprocal pattern with B crutches, flexed at hips, wide BOS        TODAY'S TREATMENT: 10/18/21 TherEx  Seated SLR x 10 bil.  Clamshells x 10 bil.  STS from standard chair height with cues for controlled eccentric x 9, limited by knee pain  Nustep L3 BLEs x 6 min with education on machine set up at gym and options for progression/regression, pt verbalized understanding and returned demo of adjusting resistance  HEP updated, printed, and reviewed  Gait training  Pt amb in clinic 10' with Lt crutch with no losses of balance or instability, pt advised to walk at home and short community distances with 1 crutch and to use 2 for longer distances or with high pain, pt verbalized understanding  10/05/21 Nustep L3 BLEs only x6 minutes      PATIENT EDUCATION:  10/05/21 Education details: exam findings, POC, HEP  Person educated: Patient Education method: Explanation, Demonstration, and Handouts Education comprehension: verbalized understanding and returned demonstration  HOME EXERCISE PROGRAM: Access Code: [email or web address] URL: [email or web address] Date: 10/18/2021 Prepared by: Scot Jun  Exercises - Seated Hip  Abduction with Resistance  - 1-2 x daily - 7 x weekly - 1 sets - 15 reps - 1 hold - Seated March with Resistance  - 1-2 x daily - 7 x weekly - 1 sets - 10-15 reps - 1 hold - Seated Transversus Abdominis Bracing  - 3 x daily - 7 x weekly - 1 sets - 20 reps - 3 hold - Seated Straight Leg Heel Taps  - 1-2 x daily - 7 x weekly - 3 sets - 10 reps - Sit to Stand  - 3 x daily - 7 x weekly - 1 sets - 10 reps - Clamshell  - 1 x daily - 7 x weekly - 2-3 sets - 10 reps   ASSESSMENT:   CLINICAL IMPRESSION: She presented today with low pain following medication use after overdoing it at the gym, so education included nustep set up and progression/regression. HEP was reviewed and updated to include additional LE and hip strengthening. She appears safe to ambulate with a single crutch and was advised to do so at home and in short community distances. She continues to benefit from skilled PT to address strength, mobility, gait, and balance deficits.   OBJECTIVE IMPAIRMENTS Abnormal gait, decreased balance, decreased endurance, decreased knowledge of use of DME, decreased mobility, difficulty walking, decreased ROM, decreased strength, increased fascial restrictions, increased muscle spasms, impaired flexibility, postural dysfunction, obesity, and pain.    ACTIVITY LIMITATIONS carrying, lifting, standing, squatting, stairs, transfers, and locomotion level   PARTICIPATION LIMITATIONS: meal prep, cleaning, laundry, interpersonal relationship, driving, shopping, community activity, occupation, and yard work   PERSONAL FACTORS Age, Behavior pattern, Fitness, Past/current experiences, and Time since onset of injury/illness/exacerbation are also affecting patient's functional outcome.    REHAB POTENTIAL: Good   CLINICAL DECISION MAKING: Evolving/moderate complexity   EVALUATION COMPLEXITY: Moderate     GOALS: Goals reviewed with patient? Yes   SHORT TERM GOALS: Target date: 11/09/2021  Will be compliant with  appropriate progressive HEP  Baseline: Goal status: INITIAL   2.  Will have better understanding of posture and ergonomics  Baseline:  Goal status: INITIAL   3.  Will be able to ambulate up to 355f with U crutch or SPC without unsteadiness or increased pain  Baseline:  Goal status: INITIAL   4.  Lumbar ROM to be minimally impaired on all planes of motion  Baseline:  Goal status: INITIAL     LONG TERM GOALS: Target date: 12/14/2021    MMT to improve by at least 1 grade in all weak groups  Baseline:  Goal status: INITIAL   2.  Will be able to complete 5x sit to stand in 15 seconds or less without UEs  Baseline:  Goal status: INITIAL   3.  Will be able to complete TUG in 12 seconds or less with LRAD  Baseline:  Goal status: INITIAL   4.  Will be able to ambulate unlimited community distances with LRAD and back pain no more than 2/10 Baseline:  Goal status: INITIAL   5.  Will be able to ambulate distances of 200-304fwith no device and no increased pain or unsteadiness  Baseline:  Goal status: INITIAL   6.  Will tolerate return to work for at least a 4 hour shift without increase in pain  Baseline:  Goal status: INITIAL     PLAN: PT FREQUENCY: 2x/week  PT DURATION: 10 weeks   PLANNED INTERVENTIONS: Therapeutic exercises, Therapeutic activity, Neuromuscular re-education, Balance training, Gait training, Patient/Family education, Self Care, Joint mobilization, Stair training, DME instructions, Aquatic Therapy, Dry Needling, Electrical stimulation, Spinal mobilization, Cryotherapy, Moist heat, Taping, Vasopneumatic device, Ultrasound, Ionotophoresis '4mg'$ /ml Dexamethasone, Manual therapy, and Re-evaluation   PLAN FOR NEXT SESSION: Continue to progress hip, LE, and core strengthening, introduce leg press or [employer] machine options. Static and dynamic balance    Jana Hakim, Student-PT 10/18/2021, 4:32 PM   This entire session of physical therapy was performed under the  direct supervision of PT signing evaluation /treatment. PT reviewed note and agrees.  Scot Jun, PT, DPT, OCS, ATC 10/18/2021, 4:38 PM

## 2021-10-20 ENCOUNTER — Encounter: Payer: Self-pay | Admitting: Rehabilitative and Restorative Service Providers"

## 2021-10-20 ENCOUNTER — Ambulatory Visit (INDEPENDENT_AMBULATORY_CARE_PROVIDER_SITE_OTHER): Payer: No Typology Code available for payment source | Admitting: Rehabilitative and Restorative Service Providers"

## 2021-10-20 DIAGNOSIS — M5459 Other low back pain: Secondary | ICD-10-CM | POA: Diagnosis not present

## 2021-10-20 DIAGNOSIS — R262 Difficulty in walking, not elsewhere classified: Secondary | ICD-10-CM

## 2021-10-20 DIAGNOSIS — M6281 Muscle weakness (generalized): Secondary | ICD-10-CM

## 2021-10-20 DIAGNOSIS — R2681 Unsteadiness on feet: Secondary | ICD-10-CM

## 2021-10-20 NOTE — Therapy (Unsigned)
OUTPATIENT PHYSICAL THERAPY TREATMENT NOTE   Patient Name: [name] MRN: [id number] DOB:Apr 30, 1960, 61 y.o., female Today's Date: 10/20/2021  PCP: Bertram Millard MD  REFERRING PROVIDER: [name], MD   END OF SESSION:   PT End of Session - 10/20/21 0930     Visit Number 3    Number of Visits 21    Date for PT Re-Evaluation 12/14/21    Authorization Type [employer]    Authorization Time Period 10/05/21 to 12/14/21    PT Start Time 0846    PT Stop Time 0930    PT Time Calculation (min) 44 min    Activity Tolerance Patient tolerated treatment well    Behavior During Therapy [facility] for tasks assessed/performed            Past Medical History:  Diagnosis Date   Anemia    Arthritis    Atrial fibrillation ([place])    Bleeding ulcer 12-2010  hospitalized for 1 week   Blood transfusion    Blood transfusion without reported diagnosis    2011-2 units transfused, bleeding ulcer   Bruises easily    CHF (congestive heart failure) ([place])    with PE resolved after    Complication of anesthesia    Constipation    Difficulty in swallowing 03/24/2013   no longer a problem   Diverticulitis    DVT (deep venous thrombosis) ([place]) 06/2013   right leg   Dysrhythmia     Hx of A fib    GERD (gastroesophageal reflux disease)    Hyperlipidemia    Leg swelling 03/24/2013   not a problem any longer   Osteoarthritis    Peripheral vascular disease ([place])    Pre-diabetes    Pulmonary embolus ([place]) 06/2013   Reflux    Shortness of breath    with PE   Vomiting 03/24/2013   problem resolved after lap band revision   Past Surgical History:  Procedure Laterality Date   APPENDECTOMY     Claudette Head     CHOLECYSTECTOMY     7'67   COLONOSCOPY N/A 04/10/2013   Procedure: COLONOSCOPY;  Surgeon: Beryle Beams, MD;  [facility]: [facility];  Service: Endoscopy;  Laterality: N/A;   DENTAL SURGERY  10/2019   tooth implant   DIAGNOSTIC LAPAROSCOPY     ENDOVENOUS ABLATION  SAPHENOUS VEIN W/ LASER  06/2008   ENDOVENOUS ABLATION SAPHENOUS VEIN W/ LASER Left 12/09/2013   EVLA LEFT GREATER SAPHENOUS VEIN BY TODD EARLY MD   ESOPHAGOGASTRODUODENOSCOPY  12/18/2010   Procedure: ESOPHAGOGASTRODUODENOSCOPY (EGD);  Surgeon: Gatha Mayer, MD;  [facility];  Service: Endoscopy;  Laterality: N/A;   I & D KNEE WITH POLY EXCHANGE Right 01/06/2021   Procedure: IRRIGATION AND DEBRIDEMENT RIGHT KNEE WITH  POLY EXCHANGE;  Surgeon: [name], MD;  Location: [facility];  Service: Orthopedics;  Laterality: Right;   JOINT REPLACEMENT Left    knee   LAPAROSCOPIC GASTRIC BANDING  01/05/2008   LAPAROSCOPIC GASTRIC BANDING  12/18/2010   01/04/2009   SALPHINGOOOPHORECTOMY Right    cyst removal   TONSILLECTOMY     TOTAL KNEE ARTHROPLASTY Left 07/31/2016   Procedure: LEFT TOTAL KNEE ARTHROPLASTY;  Surgeon: [name], MD;  Location: [facility];  Service: Orthopedics;  Laterality: Left;   TOTAL KNEE ARTHROPLASTY Right 12/23/2020   Procedure: RIGHT TOTAL KNEE ARTHROPLASTY;  Surgeon: [name], MD;  [facility];  Service: Orthopedics;  Laterality: Right;   WEDGE RESECTION OF OVARY  LEFT OVARY    Patient Active Problem List   Diagnosis Date Noted   Tachypnea 01/03/2021   Cellulitis of right knee 01/03/2021   Chronic anticoagulation 01/03/2021   Status post total right knee replacement 12/23/2020   Unilateral primary osteoarthritis, right knee 04/07/2020   Leg edema 08/19/2018   Presence of left artificial knee joint 08/14/2016   Unilateral primary osteoarthritis, left knee 07/31/2016   Status post total knee replacement, left 07/31/2016   Abdominal hematoma    Post-operative state    Paroxysmal atrial fibrillation ([place])    Controlled type 2 diabetes mellitus with diabetic nephropathy ([place])    Wound infection 01/25/2016   Hydronephrosis, left 01/25/2016   Hydronephrosis, right 01/25/2016   DVT (deep venous thrombosis) ([place]) 01/25/2016    Atrial fibrillation ([place]) 01/25/2016   Diabetes mellitus with complication ([place])    Wound infection after surgery, initial encounter    Varicose veins of lower extremities with complications 51/88/4166   Pain and swelling of left lower leg 10/27/2013   Pain of left lower extremity 10/27/2013   Gastroenteritis, acute 09/08/2013   Fever, unspecified 09/04/2013   ASD (atrial septal defect) 08/13/2013   Pulmonary embolism ([place]) 06/22/2013   Dyspnea 06/11/2013   Anterior slip repaired Oct 2012 01/18/2011   GI bleed 12/18/2010   Diabetes mellitus ([place]) 12/18/2010   Varicose veins of lower extremities with other complications 08/05/1599   Diabetes mellitus    HIP PAIN, RIGHT 03/06/2010    REFERRING DIAG: T84.53XD (ICD-10-CM) - Infection of total right knee replacement, subsequent encounter Z96.651 (ICD-10-CM) - Status post total right knee replacement   THERAPY DIAG:  Muscle weakness (generalized)  Unsteadiness on feet  Difficulty in walking, not elsewhere classified  Other low back pain  Rationale for Evaluation and Treatment Rehabilitation  ONSET DATE: 09/08/2021    SUBJECTIVE:    SUBJECTIVE STATEMENT: She has been doing well with the back feeling fantastic. She has been doing the exercises and notes the clamshells are getting better, progressing from 2-3 at a time up to 10 at a time. She uses one crutch inside the house, using both for longer walking or places she has to move quickly.   PERTINENT HISTORY: follow-up as a relates to a MRSA infection in the right total knee arthroplasty.    PAIN:  Are you having pain? Yes: NPRS scale: 0/10; highest since last appointment 2/10 in the knee Pain location: Lt sided back pain and R knee pain  Pain description: sharp   Aggravating factors: everything but sitting  Relieving factors: seated rest    PRECAUTIONS: None   WEIGHT BEARING RESTRICTIONS No   FALLS:  Has patient fallen in last 6 months? 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    PLOF: Independent, Independent with basic ADLs, Independent with gait, and Independent with transfers   PATIENT GOALS improve function in general, be able to walk without crutches, decrease pain      OBJECTIVE:    PATIENT SURVEYS:  10/05/21 FOTO 34.8   COGNITION: 10/05/21           Overall cognitive status: Within functional limits for tasks assessed  SENSATION:     EDEMA:      MUSCLE LENGTH: 10/05/21 Hamstrings WNL, Piriformis moderate limitation L severe limitation R    POSTURE:  10/05/21 rounded shoulders, forward head, decreased lumbar lordosis, decreased thoracic kyphosis, and flexed trunk    PALPATION: 10/05/21 Lt lumbar paraspinals TTP, more tense than the R    LOWER EXTREMITY ROM:   Active ROM Right 10/05/21 Left 10/05/21  Hip flexion      Hip extension      Hip abduction      Hip adduction      Hip internal rotation      Hip external rotation      Knee flexion 101 105  Knee extension 4 2  Ankle dorsiflexion      Ankle plantarflexion      Ankle inversion      Ankle eversion       (Blank rows = not tested)   10/05/21 Lumbar ROM flexion WNL, extension severe limitation, lateral flexion moderate limitation    LOWER EXTREMITY MMT:   MMT Right 10/05/21 Left 10/05/21  Hip flexion 3- 3  Hip extension      Hip abduction 3- strong compensation with hip flexors  3- strong compensation with hip flexors  Hip adduction      Hip internal rotation      Hip external rotation      Knee flexion 4+ 4+  Knee extension 4+ 4+  Ankle dorsiflexion 5 5  Ankle plantarflexion      Ankle inversion      Ankle eversion       (Blank rows = not tested)   LOWER EXTREMITY  SPECIAL TESTS:      FUNCTIONAL TESTS:  10/05/21 5 times sit to stand: 21 seconds no UEs  Timed up and go (TUG): 16 seconds B crutches    GAIT: 10/18/21 Pt amb in clinic with Lt crutch with no losses of balance or instability  10/05/21 Distance walked: in clinic distances  Assistive device utilized: Crutches Level of assistance: Modified independence Comments: reciprocal pattern with B crutches, flexed at hips, wide BOS        TODAY'S TREATMENT: 10/20/21 TherEx  Nustep L3 BLEs x 8 min  Leg press 45* BLEs 75# x 10, 100# 2 x 10  Standing rows green band 2 x 12  Standing shoulder extension green band 2 x 12, increased arm fatigue  Seated multifidus lift 5s hold x 10  Seated abdominal press 5s hold x 10  Seated pelvic tilts with cues for abdominal squeeze x 15  Neuro Re-ed  Static stance eyes open narrow BOS x 30s Heel/toe rises x 15 ea SBA Tandem stance eyes open x 15s RLE posterior with increased Rt knee pain, x 30s LLE posterior  Static stance eyes closed 2 x 30s, multiple instances of UE assist to maintain balance  Static stance on foam normal BOS eyes open x 30s  A-P weight shifting on foam x 1.5 min  Static stance on foam normal BOS eyes closed x 30s SBA with increased sway, one instance of CGA while regaining balance   10/18/21 TherEx  Seated SLR x 10 bil.  Clamshells x 10 bil.  STS from standard chair height with cues for controlled eccentric x 9, limited by knee pain  Nustep L3 BLEs x 6 min with education on machine set up at gym and options for progression/regression, pt verbalized understanding and returned demo of adjusting resistance  HEP updated, printed, and reviewed  Gait  training  Pt amb in clinic 10' with Lt crutch with no losses of balance or instability, pt advised to walk at home and short community distances with 1 crutch and to use 2 for longer distances or with high pain, pt verbalized understanding  10/05/21 Nustep L3 BLEs only x6 minutes       PATIENT EDUCATION:  10/05/21 Education details: exam findings, POC, HEP  Person educated: Patient Education method: Consulting civil engineer, Demonstration, and Handouts Education comprehension: verbalized understanding and returned demonstration     HOME EXERCISE PROGRAM: Access Code: [email or web address] URL: [email or web address] Date: 10/18/2021 Prepared by: Scot Jun  Exercises - Seated Hip Abduction with Resistance  - 1-2 x daily - 7 x weekly - 1 sets - 15 reps - 1 hold - Seated March with Resistance  - 1-2 x daily - 7 x weekly - 1 sets - 10-15 reps - 1 hold - Seated Transversus Abdominis Bracing  - 3 x daily - 7 x weekly - 1 sets - 20 reps - 3 hold - Seated Straight Leg Heel Taps  - 1-2 x daily - 7 x weekly - 3 sets - 10 reps - Sit to Stand  - 3 x daily - 7 x weekly - 1 sets - 10 reps - Clamshell  - 1 x daily - 7 x weekly - 2-3 sets - 10 reps   ASSESSMENT:   CLINICAL IMPRESSION: She presented to clinic feeling great and tolerated the session well with increased standing and activity tolerance. Leg press was introduced with explanation of set up at gym and monitoring intensity. She is progressing with both LE/trunk strengthening and static balance tasks, and will continue to benefit from skilled PT.   OBJECTIVE IMPAIRMENTS Abnormal gait, decreased balance, decreased endurance, decreased knowledge of use of DME, decreased mobility, difficulty walking, decreased ROM, decreased strength, increased fascial restrictions, increased muscle spasms, impaired flexibility, postural dysfunction, obesity, and pain.    ACTIVITY LIMITATIONS carrying, lifting, standing, squatting, stairs, transfers, and locomotion level   PARTICIPATION LIMITATIONS: meal prep, cleaning, laundry, interpersonal relationship, driving, shopping, community activity, occupation, and yard work   PERSONAL FACTORS Age, Behavior pattern, Fitness, Past/current experiences, and Time since onset of injury/illness/exacerbation  are also affecting patient's functional outcome.    REHAB POTENTIAL: Good   CLINICAL DECISION MAKING: Evolving/moderate complexity   EVALUATION COMPLEXITY: Moderate     GOALS: Goals reviewed with patient? Yes   SHORT TERM GOALS: Target date: 11/09/2021  Will be compliant with appropriate progressive HEP  Baseline: Goal status: INITIAL   2.  Will have better understanding of posture and ergonomics  Baseline:  Goal status: INITIAL   3.  Will be able to ambulate up to 346f with U crutch or SPC without unsteadiness or increased pain  Baseline:  Goal status: INITIAL   4.  Lumbar ROM to be minimally impaired on all planes of motion  Baseline:  Goal status: INITIAL     LONG TERM GOALS: Target date: 12/14/2021    MMT to improve by at least 1 grade in all weak groups  Baseline:  Goal status: INITIAL   2.  Will be able to complete 5x sit to stand in 15 seconds or less without UEs  Baseline:  Goal status: INITIAL   3.  Will be able to complete TUG in 12 seconds or less with LRAD  Baseline:  Goal status: INITIAL   4.  Will be able to ambulate unlimited community distances with LRAD and back pain no more than 2/10 Baseline:  Goal status: INITIAL   5.  Will be able to ambulate distances of 200-349f with no device and no increased pain or unsteadiness  Baseline:  Goal status: INITIAL   6.  Will tolerate return to work for at least a 4 hour shift without increase in pain  Baseline:  Goal status: INITIAL     PLAN: PT FREQUENCY: 2x/week   PT DURATION: 10 weeks   PLANNED INTERVENTIONS: Therapeutic exercises, Therapeutic activity, Neuromuscular re-education, Balance training, Gait training, Patient/Family education, Self Care, Joint mobilization, Stair training, DME instructions, Aquatic Therapy, Dry Needling, Electrical stimulation, Spinal mobilization, Cryotherapy, Moist heat, Taping, Vasopneumatic device, Ultrasound, Ionotophoresis '4mg'$ /ml Dexamethasone, Manual therapy,  and Re-evaluation   PLAN FOR NEXT SESSION: Progress hip, LE, and core strengthening, possibly introduce leg ext/curl machine. Continue static and dynamic balance    KJana Hakim Student-PT 10/20/2021, 2:20 PM  This entire session of physical therapy was performed under the direct supervision of PT signing evaluation /treatment. PT reviewed note and agrees.  MScot Jun PT, DPT, OCS, ATC 10/20/2021, 4:38 PM

## 2021-10-23 ENCOUNTER — Encounter: Payer: Self-pay | Admitting: Orthopedic Surgery

## 2021-10-23 ENCOUNTER — Ambulatory Visit (INDEPENDENT_AMBULATORY_CARE_PROVIDER_SITE_OTHER): Payer: No Typology Code available for payment source | Admitting: Orthopedic Surgery

## 2021-10-23 DIAGNOSIS — G8929 Other chronic pain: Secondary | ICD-10-CM

## 2021-10-23 DIAGNOSIS — M545 Low back pain, unspecified: Secondary | ICD-10-CM | POA: Diagnosis not present

## 2021-10-23 NOTE — Progress Notes (Signed)
Office Visit Note   Patient: [name]           Date of Birth: April 20, 1960           MRN: [id number] Visit Date: 10/23/2021              Requested by: Mateo Flow, MD [address] [place],  [place] [address] PCP: Mateo Flow, MD  Chief Complaint  Patient presents with   Lower Back - Follow-up      HPI: Patient is a 61 year old woman who presents in follow-up for left lower back pain.  She has been doing physical therapy and taking Celebrex and states that she is doing much better.  She is still using crutches still going to physical therapy.  Assessment & Plan: Visit Diagnoses:  1. Chronic left-sided low back pain without sciatica     Plan: Continue with current care advance to therapy on her own.  She will follow-up if symptoms worsen.  Follow-Up Instructions: Return if symptoms worsen or fail to improve.   Ortho Exam  Patient is alert, oriented, no adenopathy, well-dressed, normal affect, normal respiratory effort. Examination patient ambulates with crutches.  She has no sciatic tension signs no focal motor weakness.  Imaging: No results found. No images are attached to the encounter.  Labs: Lab Results  Component Value Date   HGBA1C 5.2 12/16/2020   HGBA1C <4.2 (L) 01/25/2016   ESRSEDRATE 41 (H) 03/17/2021   ESRSEDRATE 45 (H) 02/16/2021   ESRSEDRATE 47 (H) 01/11/2021   CRP 31.4 (H) 03/17/2021   CRP 15.2 (H) 02/16/2021   CRP 5.5 (H) 01/11/2021   REPTSTATUS 01/12/2021 FINAL 01/06/2021   GRAMSTAIN  01/06/2021    MODERATE WBC PRESENT, PREDOMINANTLY MONONUCLEAR FEW GRAM POSITIVE COCCI    CULT  01/06/2021    MODERATE METHICILLIN RESISTANT STAPHYLOCOCCUS AUREUS NO ANAEROBES ISOLATED Performed at [facility], [place] [address], [place], [place] [address]    LABORGA METHICILLIN RESISTANT STAPHYLOCOCCUS AUREUS 01/06/2021     Lab Results  Component Value Date   ALBUMIN 2.3 (L) 01/12/2021   ALBUMIN 2.1 (L) 01/03/2021   ALBUMIN 3.4 (L)  12/14/2018    No results found for: "MG" No results found for: "VD25OH"  No results found for: "PREALBUMIN"    Latest Ref Rng & Units 03/17/2021   11:39 AM 01/13/2021    4:38 AM 01/12/2021    4:17 AM  CBC EXTENDED  WBC 3.8 - 10.8 Thousand/uL 5.0  4.6  5.7   RBC 3.80 - 5.10 Million/uL 4.82  3.89  3.98   Hemoglobin 11.7 - 15.5 g/dL 11.6  9.9  10.0   HCT 35.0 - 45.0 % 37.8  32.9  33.1   Platelets 140 - 400 Thousand/uL 343  286  306   NEUT# 1,500 - 7,800 cells/uL 2,640  2.6  3.2   Lymph# 850 - 3,900 cells/uL 1,665  1.2  1.6      There is no height or weight on file to calculate BMI.  Orders:  No orders of the defined types were placed in this encounter.  No orders of the defined types were placed in this encounter.    Procedures: No procedures performed  Clinical Data: No additional findings.  ROS:  All other systems negative, except as noted in the HPI. Review of Systems  Objective: Vital Signs: There were no vitals taken for this visit.  Specialty Comments:  No specialty comments available.  PMFS History: Patient Active Problem List  Diagnosis Date Noted   Tachypnea 01/03/2021   Cellulitis of right knee 01/03/2021   Chronic anticoagulation 01/03/2021   Status post total right knee replacement 12/23/2020   Unilateral primary osteoarthritis, right knee 04/07/2020   Leg edema 08/19/2018   Presence of left artificial knee joint 08/14/2016   Unilateral primary osteoarthritis, left knee 07/31/2016   Status post total knee replacement, left 07/31/2016   Abdominal hematoma    Post-operative state    Paroxysmal atrial fibrillation ([place])    Controlled type 2 diabetes mellitus with diabetic nephropathy ([place])    Wound infection 01/25/2016   Hydronephrosis, left 01/25/2016   Hydronephrosis, right 01/25/2016   DVT (deep venous thrombosis) ([place]) 01/25/2016   Atrial fibrillation ([place]) 01/25/2016   Diabetes mellitus with complication ([place])    Wound infection after  surgery, initial encounter    Varicose veins of lower extremities with complications 76/73/4193   Pain and swelling of left lower leg 10/27/2013   Pain of left lower extremity 10/27/2013   Gastroenteritis, acute 09/08/2013   Fever, unspecified 09/04/2013   ASD (atrial septal defect) 08/13/2013   Pulmonary embolism ([place]) 06/22/2013   Dyspnea 06/11/2013   Anterior slip repaired Oct 2012 01/18/2011   GI bleed 12/18/2010   Diabetes mellitus ([place]) 12/18/2010   Varicose veins of lower extremities with other complications 79/03/4095   Diabetes mellitus    HIP PAIN, RIGHT 03/06/2010   Past Medical History:  Diagnosis Date   Anemia    Arthritis    Atrial fibrillation ([place])    Bleeding ulcer 12-2010  hospitalized for 1 week   Blood transfusion    Blood transfusion without reported diagnosis    2011-2 units transfused, bleeding ulcer   Bruises easily    CHF (congestive heart failure) ([place])    with PE resolved after    Complication of anesthesia    Constipation    Difficulty in swallowing 03/24/2013   no longer a problem   Diverticulitis    DVT (deep venous thrombosis) ([place]) 06/2013   right leg   Dysrhythmia     Hx of A fib    GERD (gastroesophageal reflux disease)    Hyperlipidemia    Leg swelling 03/24/2013   not a problem any longer   Osteoarthritis    Peripheral vascular disease ([facility])    Pre-diabetes    Pulmonary embolus ([place]) 06/2013   Reflux    Shortness of breath    with PE   Vomiting 03/24/2013   problem resolved after lap band revision    Family History  Problem Relation Age of Onset   Heart disease Father    Kidney disease Father    Peripheral vascular disease Father    Heart attack Father        69   Diabetes Mother    Arthritis Mother     Past Surgical History:  Procedure Laterality Date   APPENDECTOMY     Claudette Head     CHOLECYSTECTOMY     3'53   COLONOSCOPY N/A 04/10/2013   Procedure: COLONOSCOPY;  Surgeon: Beryle Beams, MD;   [facility];  Service: Endoscopy;  Laterality: N/A;   DENTAL SURGERY  10/2019   tooth implant   DIAGNOSTIC LAPAROSCOPY     ENDOVENOUS ABLATION SAPHENOUS VEIN W/ LASER  06/2008   ENDOVENOUS ABLATION SAPHENOUS VEIN W/ LASER Left 12/09/2013   EVLA LEFT GREATER SAPHENOUS VEIN BY TODD EARLY MD   ESOPHAGOGASTRODUODENOSCOPY  12/18/2010   Procedure: ESOPHAGOGASTRODUODENOSCOPY (EGD);  Surgeon: Glendell Docker  Simonne Maffucci, MD;  [facility];  Service: Endoscopy;  Laterality: N/A;   I & D KNEE WITH POLY EXCHANGE Right 01/06/2021   Procedure: IRRIGATION AND DEBRIDEMENT RIGHT KNEE WITH  POLY EXCHANGE;  Surgeon: [name], MD;  Location: [facility];  Service: Orthopedics;  Laterality: Right;   JOINT REPLACEMENT Left    knee   LAPAROSCOPIC GASTRIC BANDING  01/05/2008   LAPAROSCOPIC GASTRIC BANDING  12/18/2010   01/04/2009   SALPHINGOOOPHORECTOMY Right    cyst removal   TONSILLECTOMY     TOTAL KNEE ARTHROPLASTY Left 07/31/2016   Procedure: LEFT TOTAL KNEE ARTHROPLASTY;  Surgeon: [name], MD;  Location: [facility];  Service: Orthopedics;  Laterality: Left;   TOTAL KNEE ARTHROPLASTY Right 12/23/2020   Procedure: RIGHT TOTAL KNEE ARTHROPLASTY;  Surgeon: [name], MD;  [facility];  Service: Orthopedics;  Laterality: Right;   WEDGE RESECTION OF OVARY  LEFT OVARY    Social History   Occupational History   Occupation: Programmer, multimedia: [employer]  Tobacco Use   Smoking status: Never   Smokeless tobacco: Never  Vaping Use   Vaping Use: Never used  Substance and Sexual Activity   Alcohol use: No    Alcohol/week: 0.0 standard drinks of alcohol   Drug use: No   Sexual activity: Yes    Partners: Male    Birth control/protection: None

## 2021-10-24 ENCOUNTER — Other Ambulatory Visit: Payer: Self-pay | Admitting: Orthopaedic Surgery

## 2021-10-24 ENCOUNTER — Ambulatory Visit (INDEPENDENT_AMBULATORY_CARE_PROVIDER_SITE_OTHER): Payer: No Typology Code available for payment source | Admitting: Physical Therapy

## 2021-10-24 ENCOUNTER — Encounter: Payer: Self-pay | Admitting: Physical Therapy

## 2021-10-24 DIAGNOSIS — M5459 Other low back pain: Secondary | ICD-10-CM | POA: Diagnosis not present

## 2021-10-24 DIAGNOSIS — R262 Difficulty in walking, not elsewhere classified: Secondary | ICD-10-CM

## 2021-10-24 DIAGNOSIS — R2681 Unsteadiness on feet: Secondary | ICD-10-CM | POA: Diagnosis not present

## 2021-10-24 DIAGNOSIS — M6281 Muscle weakness (generalized): Secondary | ICD-10-CM | POA: Diagnosis not present

## 2021-10-24 NOTE — Telephone Encounter (Signed)
Needs to get from cardiologist or PCP

## 2021-10-24 NOTE — Therapy (Signed)
OUTPATIENT PHYSICAL THERAPY TREATMENT NOTE   Patient Name: [name] MRN: [id number] DOB:12/18/1960, 61 y.o., female Today's Date: 10/24/2021  PCP: Bertram Millard MD  REFERRING PROVIDER: [name], MD   END OF SESSION:   PT End of Session - 10/24/21 0850     Visit Number 4    Number of Visits 21    Date for PT Re-Evaluation 12/14/21    Authorization Type [employer]    Authorization Time Period 10/05/21 to 12/14/21    PT Start Time 0845    PT Stop Time 0929    PT Time Calculation (min) 44 min    Activity Tolerance Patient tolerated treatment well    Behavior During Therapy [facility] for tasks assessed/performed             Past Medical History:  Diagnosis Date   Anemia    Arthritis    Atrial fibrillation ([place])    Bleeding ulcer 12-2010  hospitalized for 1 week   Blood transfusion    Blood transfusion without reported diagnosis    2011-2 units transfused, bleeding ulcer   Bruises easily    CHF (congestive heart failure) ([place])    with PE resolved after    Complication of anesthesia    Constipation    Difficulty in swallowing 03/24/2013   no longer a problem   Diverticulitis    DVT (deep venous thrombosis) ([place]) 06/2013   right leg   Dysrhythmia     Hx of A fib    GERD (gastroesophageal reflux disease)    Hyperlipidemia    Leg swelling 03/24/2013   not a problem any longer   Osteoarthritis    Peripheral vascular disease ([place])    Pre-diabetes    Pulmonary embolus ([place]) 06/2013   Reflux    Shortness of breath    with PE   Vomiting 03/24/2013   problem resolved after lap band revision   Past Surgical History:  Procedure Laterality Date   APPENDECTOMY     [place]     CHOLECYSTECTOMY     4'69   COLONOSCOPY N/A 04/10/2013   Procedure: COLONOSCOPY;  Surgeon: Beryle Beams, MD;  Location: [facility];  Service: Endoscopy;  Laterality: N/A;   DENTAL SURGERY  10/2019   tooth implant   DIAGNOSTIC LAPAROSCOPY     ENDOVENOUS ABLATION  SAPHENOUS VEIN W/ LASER  06/2008   ENDOVENOUS ABLATION SAPHENOUS VEIN W/ LASER Left 12/09/2013   EVLA LEFT GREATER SAPHENOUS VEIN BY TODD EARLY MD   ESOPHAGOGASTRODUODENOSCOPY  12/18/2010   Procedure: ESOPHAGOGASTRODUODENOSCOPY (EGD);  Surgeon: Gatha Mayer, MD;  Location: [facility];  Service: Endoscopy;  Laterality: N/A;   I & D KNEE WITH POLY EXCHANGE Right 01/06/2021   Procedure: IRRIGATION AND DEBRIDEMENT RIGHT KNEE WITH  POLY EXCHANGE;  Surgeon: [name], MD;  Location: [place];  Service: Orthopedics;  Laterality: Right;   JOINT REPLACEMENT Left    knee   LAPAROSCOPIC GASTRIC BANDING  01/05/2008   LAPAROSCOPIC GASTRIC BANDING  12/18/2010   01/04/2009   SALPHINGOOOPHORECTOMY Right    cyst removal   TONSILLECTOMY     TOTAL KNEE ARTHROPLASTY Left 07/31/2016   Procedure: LEFT TOTAL KNEE ARTHROPLASTY;  Surgeon: [name], MD;  Location: [place];  Service: Orthopedics;  Laterality: Left;   TOTAL KNEE ARTHROPLASTY Right 12/23/2020   Procedure: RIGHT TOTAL KNEE ARTHROPLASTY;  Surgeon: [name], MD;  Location: [facility];  Service: Orthopedics;  Laterality: Right;   WEDGE RESECTION OF  OVARY  LEFT OVARY    Patient Active Problem List   Diagnosis Date Noted   Tachypnea 01/03/2021   Cellulitis of right knee 01/03/2021   Chronic anticoagulation 01/03/2021   Status post total right knee replacement 12/23/2020   Unilateral primary osteoarthritis, right knee 04/07/2020   Leg edema 08/19/2018   Presence of left artificial knee joint 08/14/2016   Unilateral primary osteoarthritis, left knee 07/31/2016   Status post total knee replacement, left 07/31/2016   Abdominal hematoma    Post-operative state    Paroxysmal atrial fibrillation ([place])    Controlled type 2 diabetes mellitus with diabetic nephropathy ([place])    Wound infection 01/25/2016   Hydronephrosis, left 01/25/2016   Hydronephrosis, right 01/25/2016   DVT (deep venous thrombosis) ([place]) 01/25/2016    Atrial fibrillation ([place]) 01/25/2016   Diabetes mellitus with complication ([place])    Wound infection after surgery, initial encounter    Varicose veins of lower extremities with complications 17/61/6073   Pain and swelling of left lower leg 10/27/2013   Pain of left lower extremity 10/27/2013   Gastroenteritis, acute 09/08/2013   Fever, unspecified 09/04/2013   ASD (atrial septal defect) 08/13/2013   Pulmonary embolism ([place]) 06/22/2013   Dyspnea 06/11/2013   Anterior slip repaired Oct 2012 01/18/2011   GI bleed 12/18/2010   Diabetes mellitus ([place]) 12/18/2010   Varicose veins of lower extremities with other complications 71/07/2692   Diabetes mellitus    HIP PAIN, RIGHT 03/06/2010    REFERRING DIAG: T84.53XD (ICD-10-CM) - Infection of total right knee replacement, subsequent encounter Z96.651 (ICD-10-CM) - Status post total right knee replacement   THERAPY DIAG:  Muscle weakness (generalized)  Unsteadiness on feet  Difficulty in walking, not elsewhere classified  Other low back pain  Rationale for Evaluation and Treatment Rehabilitation  ONSET DATE: 09/08/2021    SUBJECTIVE:    SUBJECTIVE STATEMENT: Knee is doing well; arms are a little sore from exercises last visit    PERTINENT HISTORY: follow-up as a relates to a MRSA infection in the right total knee arthroplasty.    PAIN:  Are you having pain? Yes: NPRS scale: 0/10; highest since last appointment 2/10 in the knee Pain location: Lt sided back pain and R knee pain  Pain description: sharp   Aggravating factors: everything but sitting  Relieving factors: seated rest    PRECAUTIONS: None   WEIGHT BEARING RESTRICTIONS No   FALLS:  Has patient fallen in last 6 months? 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    PLOF: Independent, Independent with basic ADLs, Independent with gait, and Independent with transfers   PATIENT GOALS improve function in general, be able to walk without crutches, decrease pain      OBJECTIVE:    PATIENT SURVEYS:  10/05/21  FOTO 34.8   MUSCLE LENGTH: 10/05/21  Hamstrings WNL, Piriformis moderate limitation L severe limitation R    POSTURE:  10/05/21 rounded shoulders, forward head, decreased lumbar lordosis, decreased thoracic kyphosis, and flexed trunk    PALPATION: 10/05/21  Lt lumbar paraspinals TTP, more tense than the R    LOWER EXTREMITY ROM:   Active ROM Right 10/05/21 Left 10/05/21 Right 10/24/21  Hip flexion       Hip extension  Hip abduction       Hip adduction       Hip internal rotation       Hip external rotation       Knee flexion 101 105 106  Knee extension '4 2 2 '$ (hyper  LAQ)  Ankle dorsiflexion       Ankle plantarflexion       Ankle inversion       Ankle eversion        (Blank rows = not tested)   10/05/21 Lumbar ROM flexion WNL, extension severe limitation, lateral flexion moderate limitation    LOWER EXTREMITY MMT:   MMT Right 10/05/21 Left 10/05/21  Hip flexion 3- 3  Hip extension      Hip abduction 3- strong compensation with hip flexors  3- strong compensation with hip flexors  Hip adduction      Hip internal rotation      Hip external rotation      Knee flexion 4+ 4+  Knee extension 4+ 4+  Ankle dorsiflexion 5 5  Ankle plantarflexion      Ankle inversion      Ankle eversion       (Blank rows = not tested)   FUNCTIONAL TESTS:  10/05/21 5 times sit to stand: 21 seconds no UEs  Timed up and go (TUG): 16 seconds B crutches    GAIT: 10/18/21 Pt amb in clinic with Lt crutch with no losses of balance or instability  10/05/21 Distance walked: in clinic distances  Assistive device utilized: Crutches Level of assistance: Modified independence Comments: reciprocal  pattern with B crutches, flexed at hips, wide BOS        TODAY'S TREATMENT: 10/24/21 TherEx: NuStep L4 x 8 min; LEs only Leg Press 100# bil 3x10; 50# single limb (performed bil) 3x10 Supine AA heel slides x 10 reps AROM measurements as noted above Seated Rt SLR 2x10 Sit to/from stand without UE support 2x10 Knee extension machine 5# (RLE only, min A needed from LLE) 2x10 Knee flexion machine 15# (RLE only, occasional A from LLE) 2x10  10/20/21 TherEx  Nustep L3 BLEs x 8 min  Leg press 45* BLEs 75# x 10, 100# 2 x 10  Standing rows green band 2 x 12  Standing shoulder extension green band 2 x 12, increased arm fatigue  Seated multifidus lift 5s hold x 10  Seated abdominal press 5s hold x 10  Seated pelvic tilts with cues for abdominal squeeze x 15  Neuro Re-ed  Static stance eyes open narrow BOS x 30s Heel/toe rises x 15 ea SBA Tandem stance eyes open x 15s RLE posterior with increased Rt knee pain, x 30s LLE posterior  Static stance eyes closed 2 x 30s, multiple instances of UE assist to maintain balance  Static stance on foam normal BOS eyes open x 30s  A-P weight shifting on foam x 1.5 min  Static stance on foam normal BOS eyes closed x 30s SBA with increased sway, one instance of CGA while regaining balance   10/18/21 TherEx  Seated SLR x 10 bil.  Clamshells x 10 bil.  STS from standard chair height with cues for controlled eccentric x 9, limited by knee pain  Nustep L3 BLEs x 6 min with education on machine set up at gym and options for progression/regression, pt verbalized understanding and returned demo of adjusting resistance  HEP updated, printed, and reviewed  Gait training  Pt amb in clinic 10' with Lt crutch with no losses of  balance or instability, pt advised to walk at home and short community distances with 1 crutch and to use 2 for longer distances or with high pain, pt verbalized understanding     PATIENT EDUCATION:  10/05/21 Education details: exam  findings, POC, HEP  Person educated: Patient Education method: Explanation, Demonstration, and Handouts Education comprehension: verbalized understanding and returned demonstration     HOME EXERCISE PROGRAM: Access Code: [email or web address] URL: [email or web address] Date: 10/18/2021 Prepared by: Scot Jun  Exercises - Seated Hip Abduction with Resistance  - 1-2 x daily - 7 x weekly - 1 sets - 15 reps - 1 hold - Seated March with Resistance  - 1-2 x daily - 7 x weekly - 1 sets - 10-15 reps - 1 hold - Seated Transversus Abdominis Bracing  - 3 x daily - 7 x weekly - 1 sets - 20 reps - 3 hold - Seated Straight Leg Heel Taps  - 1-2 x daily - 7 x weekly - 3 sets - 10 reps - Sit to Stand  - 3 x daily - 7 x weekly - 1 sets - 10 reps - Clamshell  - 1 x daily - 7 x weekly - 2-3 sets - 10 reps   ASSESSMENT:   CLINICAL IMPRESSION: Pt tolerated session well today with progression of strengthening exercises.  AROM mildly improved today.  Will continue to benefit from PT to maximize function.  OBJECTIVE IMPAIRMENTS Abnormal gait, decreased balance, decreased endurance, decreased knowledge of use of DME, decreased mobility, difficulty walking, decreased ROM, decreased strength, increased fascial restrictions, increased muscle spasms, impaired flexibility, postural dysfunction, obesity, and pain.    ACTIVITY LIMITATIONS carrying, lifting, standing, squatting, stairs, transfers, and locomotion level   PARTICIPATION LIMITATIONS: meal prep, cleaning, laundry, interpersonal relationship, driving, shopping, community activity, occupation, and yard work   PERSONAL FACTORS Age, Behavior pattern, Fitness, Past/current experiences, and Time since onset of injury/illness/exacerbation are also affecting patient's functional outcome.    REHAB POTENTIAL: Good   CLINICAL DECISION MAKING: Evolving/moderate complexity   EVALUATION COMPLEXITY: Moderate     GOALS: Goals reviewed with patient? Yes    SHORT TERM GOALS: Target date: 11/09/2021  Will be compliant with appropriate progressive HEP  Baseline: Goal status: INITIAL   2.  Will have better understanding of posture and ergonomics  Baseline:  Goal status: INITIAL   3.  Will be able to ambulate up to 347f with U crutch or SPC without unsteadiness or increased pain  Baseline:  Goal status: INITIAL   4.  Lumbar ROM to be minimally impaired on all planes of motion  Baseline:  Goal status: INITIAL     LONG TERM GOALS: Target date: 12/14/2021    MMT to improve by at least 1 grade in all weak groups  Baseline:  Goal status: INITIAL   2.  Will be able to complete 5x sit to stand in 15 seconds or less without UEs  Baseline:  Goal status: INITIAL   3.  Will be able to complete TUG in 12 seconds or less with LRAD  Baseline:  Goal status: INITIAL   4.  Will be able to ambulate unlimited community distances with LRAD and back pain no more than 2/10 Baseline:  Goal status: INITIAL   5.  Will be able to ambulate distances of 200-3037fwith no device and no increased pain or unsteadiness  Baseline:  Goal status: INITIAL   6.  Will tolerate return to work for at least a 4 hour shift without increase in  pain  Baseline:  Goal status: INITIAL     PLAN: PT FREQUENCY: 2x/week   PT DURATION: 10 weeks   PLANNED INTERVENTIONS: Therapeutic exercises, Therapeutic activity, Neuromuscular re-education, Balance training, Gait training, Patient/Family education, Self Care, Joint mobilization, Stair training, DME instructions, Aquatic Therapy, Dry Needling, Electrical stimulation, Spinal mobilization, Cryotherapy, Moist heat, Taping, Vasopneumatic device, Ultrasound, Ionotophoresis '4mg'$ /ml Dexamethasone, Manual therapy, and Re-evaluation   PLAN FOR NEXT SESSION: Progress hip, LE, and core strengthening. Continue static and dynamic balance. Gait working on decreasing to 1 or no crutches.    Laureen Abrahams, PT, DPT 10/24/21 11:09  AM

## 2021-10-24 NOTE — Telephone Encounter (Signed)
Pt portal message sent advising

## 2021-10-26 ENCOUNTER — Ambulatory Visit: Payer: No Typology Code available for payment source | Admitting: Orthopedic Surgery

## 2021-10-26 ENCOUNTER — Encounter: Payer: No Typology Code available for payment source | Admitting: Rehabilitative and Restorative Service Providers"

## 2021-10-31 ENCOUNTER — Encounter: Payer: Self-pay | Admitting: Rehabilitative and Restorative Service Providers"

## 2021-10-31 ENCOUNTER — Other Ambulatory Visit: Payer: Self-pay | Admitting: Cardiovascular Disease

## 2021-10-31 ENCOUNTER — Ambulatory Visit (INDEPENDENT_AMBULATORY_CARE_PROVIDER_SITE_OTHER): Payer: No Typology Code available for payment source | Admitting: Rehabilitative and Restorative Service Providers"

## 2021-10-31 DIAGNOSIS — R2681 Unsteadiness on feet: Secondary | ICD-10-CM | POA: Diagnosis not present

## 2021-10-31 DIAGNOSIS — R262 Difficulty in walking, not elsewhere classified: Secondary | ICD-10-CM

## 2021-10-31 DIAGNOSIS — M5459 Other low back pain: Secondary | ICD-10-CM

## 2021-10-31 DIAGNOSIS — M6281 Muscle weakness (generalized): Secondary | ICD-10-CM

## 2021-10-31 NOTE — Therapy (Signed)
OUTPATIENT PHYSICAL THERAPY TREATMENT NOTE   Patient Name: [name] MRN: [id number] DOB:Mar 28, 1960, 61 y.o., female Today's Date: 10/31/2021  PCP: Bertram Millard MD  REFERRING PROVIDER: [name], MD   END OF SESSION:   PT End of Session - 10/31/21 0932     Visit Number 5    Number of Visits 21    Date for PT Re-Evaluation 12/14/21    Authorization Type [employer]    Authorization Time Period 10/05/21 to 12/14/21    PT Start Time 0927    PT Stop Time 1006    PT Time Calculation (min) 39 min    Activity Tolerance Patient tolerated treatment well    Behavior During Therapy [facility] for tasks assessed/performed              Past Medical History:  Diagnosis Date   Anemia    Arthritis    Atrial fibrillation ([place])    Bleeding ulcer 12-2010  hospitalized for 1 week   Blood transfusion    Blood transfusion without reported diagnosis    2011-2 units transfused, bleeding ulcer   Bruises easily    CHF (congestive heart failure) ([place])    with PE resolved after    Complication of anesthesia    Constipation    Difficulty in swallowing 03/24/2013   no longer a problem   Diverticulitis    DVT (deep venous thrombosis) ([place]) 06/2013   right leg   Dysrhythmia     Hx of A fib    GERD (gastroesophageal reflux disease)    Hyperlipidemia    Leg swelling 03/24/2013   not a problem any longer   Osteoarthritis    Peripheral vascular disease ([place])    Pre-diabetes    Pulmonary embolus ([place]) 06/2013   Reflux    Shortness of breath    with PE   Vomiting 03/24/2013   problem resolved after lap band revision   Past Surgical History:  Procedure Laterality Date   APPENDECTOMY     [place]     CHOLECYSTECTOMY     4'33   COLONOSCOPY N/A 04/10/2013   Procedure: COLONOSCOPY;  Surgeon: Beryle Beams, MD;  Location: [facility];  Service: Endoscopy;  Laterality: N/A;   DENTAL SURGERY  10/2019   tooth implant   DIAGNOSTIC LAPAROSCOPY     ENDOVENOUS  ABLATION SAPHENOUS VEIN W/ LASER  06/2008   ENDOVENOUS ABLATION SAPHENOUS VEIN W/ LASER Left 12/09/2013   EVLA LEFT GREATER SAPHENOUS VEIN BY TODD EARLY MD   ESOPHAGOGASTRODUODENOSCOPY  12/18/2010   Procedure: ESOPHAGOGASTRODUODENOSCOPY (EGD);  Surgeon: Gatha Mayer, MD;  Location: [facility];  Service: Endoscopy;  Laterality: N/A;   I & D KNEE WITH POLY EXCHANGE Right 01/06/2021   Procedure: IRRIGATION AND DEBRIDEMENT RIGHT KNEE WITH  POLY EXCHANGE;  Surgeon: [name], MD;  Location: [place];  Service: Orthopedics;  Laterality: Right;   JOINT REPLACEMENT Left    knee   LAPAROSCOPIC GASTRIC BANDING  01/05/2008   LAPAROSCOPIC GASTRIC BANDING  12/18/2010   01/04/2009   SALPHINGOOOPHORECTOMY Right    cyst removal   TONSILLECTOMY     TOTAL KNEE ARTHROPLASTY Left 07/31/2016   Procedure: LEFT TOTAL KNEE ARTHROPLASTY;  Surgeon: [name], MD;  Location: [place];  Service: Orthopedics;  Laterality: Left;   TOTAL KNEE ARTHROPLASTY Right 12/23/2020   Procedure: RIGHT TOTAL KNEE ARTHROPLASTY;  Surgeon: [name], MD;  Location: [facility];  Service: Orthopedics;  Laterality: Right;   WEDGE RESECTION  OF OVARY  LEFT OVARY    Patient Active Problem List   Diagnosis Date Noted   Tachypnea 01/03/2021   Cellulitis of right knee 01/03/2021   Chronic anticoagulation 01/03/2021   Status post total right knee replacement 12/23/2020   Unilateral primary osteoarthritis, right knee 04/07/2020   Leg edema 08/19/2018   Presence of left artificial knee joint 08/14/2016   Unilateral primary osteoarthritis, left knee 07/31/2016   Status post total knee replacement, left 07/31/2016   Abdominal hematoma    Post-operative state    Paroxysmal atrial fibrillation ([place])    Controlled type 2 diabetes mellitus with diabetic nephropathy ([place])    Wound infection 01/25/2016   Hydronephrosis, left 01/25/2016   Hydronephrosis, right 01/25/2016   DVT (deep venous thrombosis) ([place])  01/25/2016   Atrial fibrillation ([place]) 01/25/2016   Diabetes mellitus with complication ([place])    Wound infection after surgery, initial encounter    Varicose veins of lower extremities with complications 67/01/4579   Pain and swelling of left lower leg 10/27/2013   Pain of left lower extremity 10/27/2013   Gastroenteritis, acute 09/08/2013   Fever, unspecified 09/04/2013   ASD (atrial septal defect) 08/13/2013   Pulmonary embolism ([place]) 06/22/2013   Dyspnea 06/11/2013   Anterior slip repaired Oct 2012 01/18/2011   GI bleed 12/18/2010   Diabetes mellitus ([place]) 12/18/2010   Varicose veins of lower extremities with other complications 99/83/3825   Diabetes mellitus    HIP PAIN, RIGHT 03/06/2010    REFERRING DIAG: T84.53XD (ICD-10-CM) - Infection of total right knee replacement, subsequent encounter Z96.651 (ICD-10-CM) - Status post total right knee replacement   THERAPY DIAG:  Muscle weakness (generalized)  Unsteadiness on feet  Difficulty in walking, not elsewhere classified  Other low back pain  Rationale for Evaluation and Treatment Rehabilitation  ONSET DATE: 09/08/2021    SUBJECTIVE:    SUBJECTIVE STATEMENT: Pt indicated feeling like she is walking better.  Using one crutch primarily.  Reported having some stiffness in Rt knee after return from beach.     PERTINENT HISTORY: follow-up as a relates to a MRSA infection in the right total knee arthroplasty.    PAIN:  NPRS scale: 2/10 Pain location: Lt sided back pain and R knee pain  Pain description: tightness/stiffness Aggravating factors: riding in car back from beach Relieving factors: moving around some   PRECAUTIONS: None   WEIGHT BEARING RESTRICTIONS No   FALLS:  Has patient fallen in last 6 months? 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    PLOF: Independent, Independent with basic ADLs, Independent with gait, and Independent with transfers   PATIENT GOALS improve function in general, be able to walk without crutches, decrease pain      OBJECTIVE:    PATIENT SURVEYS:  10/05/21  FOTO 34.8   MUSCLE LENGTH: 10/05/21  Hamstrings WNL, Piriformis moderate limitation L severe limitation R    POSTURE:  10/05/21 rounded shoulders, forward head, decreased lumbar lordosis, decreased thoracic kyphosis, and flexed trunk    PALPATION: 10/05/21  Lt lumbar paraspinals TTP, more tense than the R    LOWER EXTREMITY ROM:   Active ROM Right 10/05/21 Left 10/05/21 Right 10/24/21  Hip flexion       Hip extension  Hip abduction       Hip adduction       Hip internal rotation       Hip external rotation       Knee flexion 101 105 106  Knee extension '4 2 2 '$ (hyper  LAQ)  Ankle dorsiflexion       Ankle plantarflexion       Ankle inversion       Ankle eversion        (Blank rows = not tested)   10/05/21 Lumbar ROM flexion WNL, extension severe limitation, lateral flexion moderate limitation    LOWER EXTREMITY MMT:   MMT Right 10/05/21 Left 10/05/21  Hip flexion 3- 3  Hip extension      Hip abduction 3- strong compensation with hip flexors  3- strong compensation with hip flexors  Hip adduction      Hip internal rotation      Hip external rotation      Knee flexion 4+ 4+  Knee extension 4+ 4+  Ankle dorsiflexion 5 5  Ankle plantarflexion      Ankle inversion      Ankle eversion       (Blank rows = not tested)   FUNCTIONAL TESTS:  10/31/2021:  tandem stance independent 15-20 seconds on several tries prior to hands required to prevent loss of balance.  Similar performance bilaterally.   10/05/21 5 times sit to stand: 21 seconds no UEs  Timed up and go (TUG): 16 seconds B crutches    GAIT: 10/18/21 Pt amb in clinic with Lt crutch  with no losses of balance or instability  10/05/21 Distance walked: in clinic distances  Assistive device utilized: Crutches Level of assistance: Modified independence Comments: reciprocal pattern with B crutches, flexed at hips, wide BOS        TODAY'S TREATMENT: 10/31/2021 Therex: Seated LAQ Rt full range with end range pause 3 lbs 2 x 15 Leg extension machine double leg up, Rt leg lowering slowly 5 lbs 2 x 10  Leg curl machine Rt leg only x 15 10 lbs Nustep lvl 5 LE only 10 mins  TherActivity (to improve walking, stairs, transfers) Sit to stand to sit 18 inch table height no UE assist x 5 Leg press single leg 50 lbs 2 x 15 bilateral slow lowering focus  Neuro Re-ed Tandem stance 1 min x 1 bilateral Alt toe/heel raises x 20 each way occasional hand assist Retro ambulation in // bars 10 ft x 5 alternated with fwd walking for improved WB Rt leg  10/24/21 TherEx: NuStep L4 x 8 min; LEs only Leg Press 100# bil 3x10; 50# single limb (performed bil) 3x10 Supine AA heel slides x 10 reps AROM measurements as noted above Seated Rt SLR 2x10 Sit to/from stand without UE support 2x10 Knee extension machine 5# (RLE only, min A needed from LLE) 2x10 Knee flexion machine 15# (RLE only, occasional A from LLE) 2x10  10/20/21 TherEx  Nustep L3 BLEs x 8 min  Leg press 45* BLEs 75# x 10, 100# 2 x 10  Standing rows green band 2 x 12  Standing shoulder extension green band 2 x 12, increased arm fatigue  Seated multifidus lift 5s hold x 10  Seated abdominal press 5s hold x 10  Seated pelvic tilts with cues for abdominal squeeze x 15  Neuro Re-ed  Static stance eyes open narrow BOS x 30s Heel/toe rises x 15 ea SBA Tandem stance eyes open x 15s RLE posterior with increased  Rt knee pain, x 30s LLE posterior  Static stance eyes closed 2 x 30s, multiple instances of UE assist to maintain balance  Static stance on foam normal BOS eyes open x 30s  A-P weight shifting on foam x 1.5 min  Static  stance on foam normal BOS eyes closed x 30s SBA with increased sway, one instance of CGA while regaining balance     PATIENT EDUCATION:  10/05/21 Education details: exam findings, POC, HEP  Person educated: Patient Education method: Explanation, Demonstration, and Handouts Education comprehension: verbalized understanding and returned demonstration     HOME EXERCISE PROGRAM: Access Code: [email or web address] URL: [email or web address] Date: 10/18/2021 Prepared by: Scot Jun  Exercises - Seated Hip Abduction with Resistance  - 1-2 x daily - 7 x weekly - 1 sets - 15 reps - 1 hold - Seated March with Resistance  - 1-2 x daily - 7 x weekly - 1 sets - 10-15 reps - 1 hold - Seated Transversus Abdominis Bracing  - 3 x daily - 7 x weekly - 1 sets - 20 reps - 3 hold - Seated Straight Leg Heel Taps  - 1-2 x daily - 7 x weekly - 3 sets - 10 reps - Sit to Stand  - 3 x daily - 7 x weekly - 1 sets - 10 reps - Clamshell  - 1 x daily - 7 x weekly - 2-3 sets - 10 reps   ASSESSMENT:   CLINICAL IMPRESSION: Times of independent ambulation ( deviated from normal c reduced WB on Rt ) within clinic and otherwise using single axillary crutch with improved quality control. Poor to fair control in static balance intervention today.  Continued skilled PT services for strengthening and balance improvements to benefit Pt function.    OBJECTIVE IMPAIRMENTS Abnormal gait, decreased balance, decreased endurance, decreased knowledge of use of DME, decreased mobility, difficulty walking, decreased ROM, decreased strength, increased fascial restrictions, increased muscle spasms, impaired flexibility, postural dysfunction, obesity, and pain.    ACTIVITY LIMITATIONS carrying, lifting, standing, squatting, stairs, transfers, and locomotion level   PARTICIPATION LIMITATIONS: meal prep, cleaning, laundry, interpersonal relationship, driving, shopping, community activity, occupation, and yard work   PERSONAL  FACTORS Age, Behavior pattern, Fitness, Past/current experiences, and Time since onset of injury/illness/exacerbation are also affecting patient's functional outcome.    REHAB POTENTIAL: Good   CLINICAL DECISION MAKING: Evolving/moderate complexity   EVALUATION COMPLEXITY: Moderate     GOALS: Goals reviewed with patient? Yes   SHORT TERM GOALS: Target date: 11/09/2021  Will be compliant with appropriate progressive HEP  Baseline: Goal status: on going - assessed 10/31/2021   2.  Will have better understanding of posture and ergonomics  Baseline:  Goal status:  on going - assessed 10/31/2021   3.  Will be able to ambulate up to 341f with U crutch or SPC without unsteadiness or increased pain  Baseline:  Goal status:  on going - assessed 10/31/2021   4.  Lumbar ROM to be minimally impaired on all planes of motion  Baseline:  Goal status:  on going - assessed 10/31/2021     LONG TERM GOALS: Target date: 12/14/2021    MMT to improve by at least 1 grade in all weak groups  Baseline:  Goal status: on going - assessed 10/31/2021   2.  Will be able to complete 5x sit to stand in 15 seconds or less without UEs  Baseline:  Goal status: on going - assessed 10/31/2021   3.  Will be able  to complete TUG in 12 seconds or less with LRAD  Baseline:  Goal status: on going - assessed 10/31/2021   4.  Will be able to ambulate unlimited community distances with LRAD and back pain no more than 2/10 Baseline:  Goal status: on going - assessed 10/31/2021   5.  Will be able to ambulate distances of 200-310f with no device and no increased pain or unsteadiness  Baseline:  Goal status: on going - assessed 10/31/2021   6.  Will tolerate return to work for at least a 4 hour shift without increase in pain  Baseline:  Goal status: on going - assessed 10/31/2021     PLAN: PT FREQUENCY: 2x/week   PT DURATION: 10 weeks   PLANNED INTERVENTIONS: Therapeutic exercises, Therapeutic activity,  Neuromuscular re-education, Balance training, Gait training, Patient/Family education, Self Care, Joint mobilization, Stair training, DME instructions, Aquatic Therapy, Dry Needling, Electrical stimulation, Spinal mobilization, Cryotherapy, Moist heat, Taping, Vasopneumatic device, Ultrasound, Ionotophoresis '4mg'$ /ml Dexamethasone, Manual therapy, and Re-evaluation   PLAN FOR NEXT SESSION: Static balance interventions.  Reassess TUG/5x sit to stand/strength  MScot Jun PT, DPT, OCS, ATC 10/31/21  9:58 AM

## 2021-11-03 ENCOUNTER — Encounter: Payer: Self-pay | Admitting: Orthopaedic Surgery

## 2021-11-06 NOTE — Telephone Encounter (Signed)
Note done and sent to pt;s mychart

## 2021-11-07 ENCOUNTER — Encounter: Payer: Self-pay | Admitting: Cardiovascular Disease

## 2021-11-07 ENCOUNTER — Ambulatory Visit (INDEPENDENT_AMBULATORY_CARE_PROVIDER_SITE_OTHER): Payer: No Typology Code available for payment source | Admitting: Physical Therapy

## 2021-11-07 ENCOUNTER — Encounter: Payer: Self-pay | Admitting: Physical Therapy

## 2021-11-07 DIAGNOSIS — R262 Difficulty in walking, not elsewhere classified: Secondary | ICD-10-CM | POA: Diagnosis not present

## 2021-11-07 DIAGNOSIS — M5459 Other low back pain: Secondary | ICD-10-CM | POA: Diagnosis not present

## 2021-11-07 DIAGNOSIS — R2681 Unsteadiness on feet: Secondary | ICD-10-CM | POA: Diagnosis not present

## 2021-11-07 DIAGNOSIS — M6281 Muscle weakness (generalized): Secondary | ICD-10-CM | POA: Diagnosis not present

## 2021-11-07 NOTE — Therapy (Signed)
OUTPATIENT PHYSICAL THERAPY TREATMENT NOTE   Patient Name: [name] MRN: [id number] DOB:1960/05/28, 61 y.o., female Today's Date: 11/07/2021  PCP: Bertram Millard MD  REFERRING PROVIDER: [name], MD   END OF SESSION:   PT End of Session - 11/07/21 0933     Visit Number 6    Number of Visits 21    Date for PT Re-Evaluation 12/14/21    Authorization Type [employer]    Authorization Time Period 10/05/21 to 12/14/21    PT Start Time 0930    PT Stop Time 1010    PT Time Calculation (min) 40 min    Activity Tolerance Patient tolerated treatment well    Behavior During Therapy [facility] for tasks assessed/performed               Past Medical History:  Diagnosis Date   Anemia    Arthritis    Atrial fibrillation ([place])    Bleeding ulcer 12-2010  hospitalized for 1 week   Blood transfusion    Blood transfusion without reported diagnosis    2011-2 units transfused, bleeding ulcer   Bruises easily    CHF (congestive heart failure) ([place])    with PE resolved after    Complication of anesthesia    Constipation    Difficulty in swallowing 03/24/2013   no longer a problem   Diverticulitis    DVT (deep venous thrombosis) ([place]) 06/2013   right leg   Dysrhythmia     Hx of A fib    GERD (gastroesophageal reflux disease)    Hyperlipidemia    Leg swelling 03/24/2013   not a problem any longer   Osteoarthritis    Peripheral vascular disease ([place])    Pre-diabetes    Pulmonary embolus ([place]) 06/2013   Reflux    Shortness of breath    with PE   Vomiting 03/24/2013   problem resolved after lap band revision   Past Surgical History:  Procedure Laterality Date   APPENDECTOMY     [place]     CHOLECYSTECTOMY     6'27   COLONOSCOPY N/A 04/10/2013   Procedure: COLONOSCOPY;  Surgeon: Beryle Beams, MD;  Location: [facility];  Service: Endoscopy;  Laterality: N/A;   DENTAL SURGERY  10/2019   tooth implant   DIAGNOSTIC LAPAROSCOPY     ENDOVENOUS  ABLATION SAPHENOUS VEIN W/ LASER  06/2008   ENDOVENOUS ABLATION SAPHENOUS VEIN W/ LASER Left 12/09/2013   EVLA LEFT GREATER SAPHENOUS VEIN BY TODD EARLY MD   ESOPHAGOGASTRODUODENOSCOPY  12/18/2010   Procedure: ESOPHAGOGASTRODUODENOSCOPY (EGD);  Surgeon: Gatha Mayer, MD;  Location: [facility];  Service: Endoscopy;  Laterality: N/A;   I & D KNEE WITH POLY EXCHANGE Right 01/06/2021   Procedure: IRRIGATION AND DEBRIDEMENT RIGHT KNEE WITH  POLY EXCHANGE;  Surgeon: [name], MD;  Location: [place];  Service: Orthopedics;  Laterality: Right;   JOINT REPLACEMENT Left    knee   LAPAROSCOPIC GASTRIC BANDING  01/05/2008   LAPAROSCOPIC GASTRIC BANDING  12/18/2010   01/04/2009   SALPHINGOOOPHORECTOMY Right    cyst removal   TONSILLECTOMY     TOTAL KNEE ARTHROPLASTY Left 07/31/2016   Procedure: LEFT TOTAL KNEE ARTHROPLASTY;  Surgeon: [name], MD;  Location: [facility];  Service: Orthopedics;  Laterality: Left;   TOTAL KNEE ARTHROPLASTY Right 12/23/2020   Procedure: RIGHT TOTAL KNEE ARTHROPLASTY;  Surgeon: [name], MD;  Location: [facility];  Service: Orthopedics;  Laterality: Right;   WEDGE  RESECTION OF OVARY  LEFT OVARY    Patient Active Problem List   Diagnosis Date Noted   Tachypnea 01/03/2021   Cellulitis of right knee 01/03/2021   Chronic anticoagulation 01/03/2021   Status post total right knee replacement 12/23/2020   Unilateral primary osteoarthritis, right knee 04/07/2020   Leg edema 08/19/2018   Presence of left artificial knee joint 08/14/2016   Unilateral primary osteoarthritis, left knee 07/31/2016   Status post total knee replacement, left 07/31/2016   Abdominal hematoma    Post-operative state    Paroxysmal atrial fibrillation ([place])    Controlled type 2 diabetes mellitus with diabetic nephropathy ([place])    Wound infection 01/25/2016   Hydronephrosis, left 01/25/2016   Hydronephrosis, right 01/25/2016   DVT (deep venous thrombosis) ([place])  01/25/2016   Atrial fibrillation ([place]) 01/25/2016   Diabetes mellitus with complication ([place])    Wound infection after surgery, initial encounter    Varicose veins of lower extremities with complications 16/60/6301   Pain and swelling of left lower leg 10/27/2013   Pain of left lower extremity 10/27/2013   Gastroenteritis, acute 09/08/2013   Fever, unspecified 09/04/2013   ASD (atrial septal defect) 08/13/2013   Pulmonary embolism ([place]) 06/22/2013   Dyspnea 06/11/2013   Anterior slip repaired Oct 2012 01/18/2011   GI bleed 12/18/2010   Diabetes mellitus ([place]) 12/18/2010   Varicose veins of lower extremities with other complications 60/11/9321   Diabetes mellitus    HIP PAIN, RIGHT 03/06/2010    REFERRING DIAG: T84.53XD (ICD-10-CM) - Infection of total right knee replacement, subsequent encounter Z96.651 (ICD-10-CM) - Status post total right knee replacement   THERAPY DIAG:  Muscle weakness (generalized)  Unsteadiness on feet  Difficulty in walking, not elsewhere classified  Other low back pain  Rationale for Evaluation and Treatment Rehabilitation  ONSET DATE: 09/08/2021    SUBJECTIVE:    SUBJECTIVE STATEMENT: Doing well, has been walking more at home without AD, using single crutch as much as possible    PERTINENT HISTORY: follow-up as a relates to a MRSA infection in the right total knee arthroplasty.    PAIN:  NPRS scale: "<1"/10 Pain location: Lt sided back pain and R knee pain  Pain description: tightness/stiffness Aggravating factors: riding in car back from beach Relieving factors: moving around some   PRECAUTIONS: None   WEIGHT BEARING RESTRICTIONS No   FALLS:  Has patient fallen in last 6 months? No   LIVING ENVIRONMENT: Lives with: lives with their spouse Lives in: House/apartment Stairs: 1 STE with U rail, no steps inside  Has following equipment at home: Single point cane, Walker - 2 wheeled, Crutches, bed side commode, and walking poles    OCCUPATION: nurse at [employer]: Independent, Independent with basic ADLs, Independent with gait, and Independent with transfers   PATIENT GOALS improve function in general, be able to walk without crutches, decrease pain      OBJECTIVE:    PATIENT SURVEYS:  11/07/21: FOTO 49 10/05/21  FOTO 34.8   MUSCLE LENGTH: 10/05/21  Hamstrings WNL, Piriformis moderate limitation L severe limitation R    POSTURE:  10/05/21 rounded shoulders, forward head, decreased lumbar lordosis, decreased thoracic kyphosis, and flexed trunk    PALPATION: 10/05/21  Lt lumbar paraspinals TTP, more tense than the R    LOWER EXTREMITY ROM:   Active ROM Right 10/05/21 Left 10/05/21 Right 10/24/21  Hip flexion       Hip extension       Hip  abduction       Hip adduction       Hip internal rotation       Hip external rotation       Knee flexion 101 105 106  Knee extension _0 (hyper  LAQ)  Ankle dorsiflexion       Ankle plantarflexion       Ankle inversion       Ankle eversion        (Blank rows = not tested)   10/05/21 Lumbar ROM flexion WNL, extension severe limitation, lateral flexion moderate limitation    LOWER EXTREMITY MMT:   MMT Right 10/05/21 Left 10/05/21  Hip flexion 3- 3  Hip extension      Hip abduction 3- strong compensation with hip flexors  3- strong compensation with hip flexors  Hip adduction      Hip internal rotation      Hip external rotation      Knee flexion 4+ 4+  Knee extension 4+ 4+  Ankle dorsiflexion 5 5  Ankle plantarflexion      Ankle inversion      Ankle eversion       (Blank rows = not tested)   FUNCTIONAL TESTS:  11/07/21 5x STS: 11.19 sec without UEs TUG: 12.19 sec without AD (carrying crutch but not using)  10/31/2021:  tandem stance independent 15-20 seconds on several tries prior to hands required to prevent loss of balance.  Similar performance bilaterally.   10/05/21 5 times sit to stand: 21 seconds no UEs  Timed up and go (TUG): 16  seconds B crutches    GAIT: 10/18/21 Pt amb in clinic with Lt crutch with no losses of balance or instability  10/05/21 Distance walked: in clinic distances  Assistive device utilized: Crutches Level of assistance: Modified independence Comments: reciprocal pattern with B crutches, flexed at hips, wide BOS        TODAY'S TREATMENT: 11/07/21 TherEx: NuStep L6 x 10 min; LEs only Leg Press single limb 50# 2x15 (performed bil); eccentric focus Knee extension machine RLE only 2x10; 5# Standing hip abduction L3 band x20 reps bil Standing hip extension L3 band x20 reps bil  Physical Performance Test TUG and 5x STS as noted above; reviewed results with pt  Gait Training: In // bars with intermittent UE support and mirror feedback working on posture and decreasing Rt lean in Rt stance   10/31/2021 Therex: Seated LAQ Rt full range with end range pause 3 lbs 2 x 15 Leg extension machine double leg up, Rt leg lowering slowly 5 lbs 2 x 10  Leg curl machine Rt leg only x 15 10 lbs Nustep lvl 5 LE only 10 mins  TherActivity (to improve walking, stairs, transfers) Sit to stand to sit 18 inch table height no UE assist x 5 Leg press single leg 50 lbs 2 x 15 bilateral slow lowering focus  Neuro Re-ed Tandem stance 1 min x 1 bilateral Alt toe/heel raises x 20 each way occasional hand assist Retro ambulation in // bars 10 ft x 5 alternated with fwd walking for improved WB Rt leg  10/24/21 TherEx: NuStep L4 x 8 min; LEs only Leg Press 100# bil 3x10; 50# single limb (performed bil) 3x10 Supine AA heel slides x 10 reps AROM measurements as noted above Seated Rt SLR 2x10 Sit to/from stand without UE support 2x10 Knee extension machine 5# (RLE only, min A needed from LLE) 2x10 Knee flexion machine 15# (RLE only, occasional A from  LLE) 2x10  10/20/21 TherEx  Nustep L3 BLEs x 8 min  Leg press 45* BLEs 75# x 10, 100# 2 x 10  Standing rows green band 2 x 12  Standing shoulder extension green  band 2 x 12, increased arm fatigue  Seated multifidus lift 5s hold x 10  Seated abdominal press 5s hold x 10  Seated pelvic tilts with cues for abdominal squeeze x 15  Neuro Re-ed  Static stance eyes open narrow BOS x 30s Heel/toe rises x 15 ea SBA Tandem stance eyes open x 15s RLE posterior with increased Rt knee pain, x 30s LLE posterior  Static stance eyes closed 2 x 30s, multiple instances of UE assist to maintain balance  Static stance on foam normal BOS eyes open x 30s  A-P weight shifting on foam x 1.5 min  Static stance on foam normal BOS eyes closed x 30s SBA with increased sway, one instance of CGA while regaining balance     PATIENT EDUCATION:  10/05/21 Education details: exam findings, POC, HEP  Person educated: Patient Education method: Explanation, Demonstration, and Handouts Education comprehension: verbalized understanding and returned demonstration     HOME EXERCISE PROGRAM: Access Code: [email or web address] URL: [email or web address] Date: 10/18/2021 Prepared by: Scot Jun  Exercises - Seated Hip Abduction with Resistance  - 1-2 x daily - 7 x weekly - 1 sets - 15 reps - 1 hold - Seated March with Resistance  - 1-2 x daily - 7 x weekly - 1 sets - 10-15 reps - 1 hold - Seated Transversus Abdominis Bracing  - 3 x daily - 7 x weekly - 1 sets - 20 reps - 3 hold - Seated Straight Leg Heel Taps  - 1-2 x daily - 7 x weekly - 3 sets - 10 reps - Sit to Stand  - 3 x daily - 7 x weekly - 1 sets - 10 reps - Clamshell  - 1 x daily - 7 x weekly - 2-3 sets - 10 reps   ASSESSMENT:   CLINICAL IMPRESSION: Pt with significant improvement in TUG and 5x STS today.  Overall progressing well with PT and will continue to benefit from PT to maximize function.  FOTO score improved to near goal today as well.  OBJECTIVE IMPAIRMENTS Abnormal gait, decreased balance, decreased endurance, decreased knowledge of use of DME, decreased mobility, difficulty walking, decreased ROM,  decreased strength, increased fascial restrictions, increased muscle spasms, impaired flexibility, postural dysfunction, obesity, and pain.    ACTIVITY LIMITATIONS carrying, lifting, standing, squatting, stairs, transfers, and locomotion level   PARTICIPATION LIMITATIONS: meal prep, cleaning, laundry, interpersonal relationship, driving, shopping, community activity, occupation, and yard work   PERSONAL FACTORS Age, Behavior pattern, Fitness, Past/current experiences, and Time since onset of injury/illness/exacerbation are also affecting patient's functional outcome.    REHAB POTENTIAL: Good   CLINICAL DECISION MAKING: Evolving/moderate complexity   EVALUATION COMPLEXITY: Moderate     GOALS: Goals reviewed with patient? Yes   SHORT TERM GOALS: Target date: 11/09/2021  Will be compliant with appropriate progressive HEP  Baseline: Goal status: MET 11/07/21   2.  Will have better understanding of posture and ergonomics  Baseline:  Goal status:  MET 11/07/21   3.  Will be able to ambulate up to 349f with U crutch or SPC without unsteadiness or increased pain  Baseline:  Goal status:  MET 11/07/21   4.  Lumbar ROM to be minimally impaired on all planes of motion  Baseline:  Goal status:  on going -  assessed 10/31/2021     LONG TERM GOALS: Target date: 12/14/2021    MMT to improve by at least 1 grade in all weak groups  Baseline:  Goal status: on going - assessed 10/31/2021   2.  Will be able to complete 5x sit to stand in 15 seconds or less without UEs  Baseline:  Goal status: MET 11/07/21   3.  Will be able to complete TUG in 12 seconds or less with LRAD  Baseline:  Goal status: on going - assessed 11/07/2021   4.  Will be able to ambulate unlimited community distances with LRAD and back pain no more than 2/10 Baseline:  Goal status: on going - assessed 10/31/2021   5.  Will be able to ambulate distances of 200-375f with no device and no increased pain or unsteadiness   Baseline:  Goal status: on going - assessed 10/31/2021   6.  Will tolerate return to work for at least a 4 hour shift without increase in pain  Baseline:  Goal status: on going - assessed 10/31/2021     PLAN: PT FREQUENCY: 2x/week   PT DURATION: 10 weeks   PLANNED INTERVENTIONS: Therapeutic exercises, Therapeutic activity, Neuromuscular re-education, Balance training, Gait training, Patient/Family education, Self Care, Joint mobilization, Stair training, DME instructions, Aquatic Therapy, Dry Needling, Electrical stimulation, Spinal mobilization, Cryotherapy, Moist heat, Taping, Vasopneumatic device, Ultrasound, Ionotophoresis 491mml Dexamethasone, Manual therapy, and Re-evaluation   PLAN FOR NEXT SESSION: work on posture with gait,  Static balance interventions.    StLaureen AbrahamsPT, DPT 11/07/21 10:13 AM

## 2021-11-08 ENCOUNTER — Other Ambulatory Visit: Payer: Self-pay | Admitting: Cardiovascular Disease

## 2021-11-08 MED ORDER — METOPROLOL SUCCINATE ER 50 MG PO TB24: ORAL_TABLET | ORAL | 1 refills | Status: DC

## 2021-11-08 NOTE — Telephone Encounter (Signed)
Pt last seen here in January this year. Metoprolol on med list at that time and has current diagnosis of PAF during that visit. Will refill as requested.

## 2021-11-08 NOTE — Telephone Encounter (Signed)
Pt calling back stating that Dr. Ninfa Linden will not refill her medication metoprolol anymore and pt would like to know if Dr. Acie Fredrickson will refill this medication? Please address

## 2021-11-09 ENCOUNTER — Encounter: Payer: No Typology Code available for payment source | Admitting: Rehabilitative and Restorative Service Providers"

## 2021-11-14 ENCOUNTER — Encounter: Payer: Self-pay | Admitting: Physical Therapy

## 2021-11-14 ENCOUNTER — Ambulatory Visit (INDEPENDENT_AMBULATORY_CARE_PROVIDER_SITE_OTHER): Payer: No Typology Code available for payment source | Admitting: Physical Therapy

## 2021-11-14 DIAGNOSIS — M6281 Muscle weakness (generalized): Secondary | ICD-10-CM | POA: Diagnosis not present

## 2021-11-14 DIAGNOSIS — M5459 Other low back pain: Secondary | ICD-10-CM

## 2021-11-14 DIAGNOSIS — R2681 Unsteadiness on feet: Secondary | ICD-10-CM | POA: Diagnosis not present

## 2021-11-14 DIAGNOSIS — R262 Difficulty in walking, not elsewhere classified: Secondary | ICD-10-CM

## 2021-11-14 NOTE — Therapy (Signed)
OUTPATIENT PHYSICAL THERAPY TREATMENT NOTE   Patient Name: [name] MRN: [id number] DOB:1960-02-21, 61 y.o., female Today's Date: 11/14/2021  PCP: Bertram Millard MD  REFERRING PROVIDER: [name], MD   END OF SESSION:   PT End of Session - 11/14/21 0934     Visit Number 7    Number of Visits 21    Date for PT Re-Evaluation 12/14/21    Authorization Type [employer]    Authorization Time Period 10/05/21 to 12/14/21    PT Start Time 0931    PT Stop Time 1009    PT Time Calculation (min) 38 min    Activity Tolerance Patient tolerated treatment well    Behavior During Therapy [facility] for tasks assessed/performed                Past Medical History:  Diagnosis Date   Anemia    Arthritis    Atrial fibrillation ([place])    Bleeding ulcer 12-2010  hospitalized for 1 week   Blood transfusion    Blood transfusion without reported diagnosis    2011-2 units transfused, bleeding ulcer   Bruises easily    CHF (congestive heart failure) ([place])    with PE resolved after    Complication of anesthesia    Constipation    Difficulty in swallowing 03/24/2013   no longer a problem   Diverticulitis    DVT (deep venous thrombosis) ([place]) 06/2013   right leg   Dysrhythmia     Hx of A fib    GERD (gastroesophageal reflux disease)    Hyperlipidemia    Leg swelling 03/24/2013   not a problem any longer   Osteoarthritis    Peripheral vascular disease ([place])    Pre-diabetes    Pulmonary embolus ([place]) 06/2013   Reflux    Shortness of breath    with PE   Vomiting 03/24/2013   problem resolved after lap band revision   Past Surgical History:  Procedure Laterality Date   APPENDECTOMY     [place]     CHOLECYSTECTOMY     5'61   COLONOSCOPY N/A 04/10/2013   Procedure: COLONOSCOPY;  Surgeon: Beryle Beams, MD;  Location: [facility];  Service: Endoscopy;  Laterality: N/A;   DENTAL SURGERY  10/2019   tooth implant   DIAGNOSTIC LAPAROSCOPY     ENDOVENOUS  ABLATION SAPHENOUS VEIN W/ LASER  06/2008   ENDOVENOUS ABLATION SAPHENOUS VEIN W/ LASER Left 12/09/2013   EVLA LEFT GREATER SAPHENOUS VEIN BY TODD EARLY MD   ESOPHAGOGASTRODUODENOSCOPY  12/18/2010   Procedure: ESOPHAGOGASTRODUODENOSCOPY (EGD);  Surgeon: Gatha Mayer, MD;  Location: [facility];  Service: Endoscopy;  Laterality: N/A;   I & D KNEE WITH POLY EXCHANGE Right 01/06/2021   Procedure: IRRIGATION AND DEBRIDEMENT RIGHT KNEE WITH  POLY EXCHANGE;  Surgeon: [name], MD;  Location: [facility];  Service: Orthopedics;  Laterality: Right;   JOINT REPLACEMENT Left    knee   LAPAROSCOPIC GASTRIC BANDING  01/05/2008   LAPAROSCOPIC GASTRIC BANDING  12/18/2010   01/04/2009   SALPHINGOOOPHORECTOMY Right    cyst removal   TONSILLECTOMY     TOTAL KNEE ARTHROPLASTY Left 07/31/2016   Procedure: LEFT TOTAL KNEE ARTHROPLASTY;  Surgeon: [name], MD;  Location: [facility];  Service: Orthopedics;  Laterality: Left;   TOTAL KNEE ARTHROPLASTY Right 12/23/2020   Procedure: RIGHT TOTAL KNEE ARTHROPLASTY;  Surgeon: [name], MD;  Location: [facility];  Service: Orthopedics;  Laterality: Right;  WEDGE RESECTION OF OVARY  LEFT OVARY    Patient Active Problem List   Diagnosis Date Noted   Tachypnea 01/03/2021   Cellulitis of right knee 01/03/2021   Chronic anticoagulation 01/03/2021   Status post total right knee replacement 12/23/2020   Unilateral primary osteoarthritis, right knee 04/07/2020   Leg edema 08/19/2018   Presence of left artificial knee joint 08/14/2016   Unilateral primary osteoarthritis, left knee 07/31/2016   Status post total knee replacement, left 07/31/2016   Abdominal hematoma    Post-operative state    Paroxysmal atrial fibrillation ([place])    Controlled type 2 diabetes mellitus with diabetic nephropathy ([place])    Wound infection 01/25/2016   Hydronephrosis, left 01/25/2016   Hydronephrosis, right 01/25/2016   DVT (deep venous thrombosis) ([place])  01/25/2016   Atrial fibrillation ([place]) 01/25/2016   Diabetes mellitus with complication ([place])    Wound infection after surgery, initial encounter    Varicose veins of lower extremities with complications 22/17/9810   Pain and swelling of left lower leg 10/27/2013   Pain of left lower extremity 10/27/2013   Gastroenteritis, acute 09/08/2013   Fever, unspecified 09/04/2013   ASD (atrial septal defect) 08/13/2013   Pulmonary embolism ([place]) 06/22/2013   Dyspnea 06/11/2013   Anterior slip repaired Oct 2012 01/18/2011   GI bleed 12/18/2010   Diabetes mellitus ([place]) 12/18/2010   Varicose veins of lower extremities with other complications 25/48/6282   Diabetes mellitus    HIP PAIN, RIGHT 03/06/2010    REFERRING DIAG: T84.53XD (ICD-10-CM) - Infection of total right knee replacement, subsequent encounter Z96.651 (ICD-10-CM) - Status post total right knee replacement   THERAPY DIAG:  Muscle weakness (generalized)  Unsteadiness on feet  Difficulty in walking, not elsewhere classified  Other low back pain  Rationale for Evaluation and Treatment Rehabilitation  ONSET DATE: 09/08/2021    SUBJECTIVE:    SUBJECTIVE STATEMENT: Walking into clinic today without AD, still needs it when she fatigues.   PERTINENT HISTORY: follow-up as a relates to a MRSA infection in the right total knee arthroplasty.   PAIN:  NPRS scale: "<1"/10 Pain location: Lt sided back pain and R knee pain  Pain description: tightness/stiffness Aggravating factors: riding in car back from beach Relieving factors: moving around some   PRECAUTIONS: None   WEIGHT BEARING RESTRICTIONS No   FALLS:  Has patient fallen in last 6 months? No   LIVING ENVIRONMENT: Lives with: lives with their spouse Lives in: House/apartment Stairs: 1 STE with U rail, no steps inside  Has following equipment at home: Single point cane, Walker - 2 wheeled, Crutches, bed side commode, and walking poles   OCCUPATION: nurse at [employer]: Independent, Independent with basic ADLs, Independent with gait, and Independent with transfers   PATIENT GOALS improve function in general, be able to walk without crutches, decrease pain      OBJECTIVE:    PATIENT SURVEYS:  11/07/21: FOTO 49 10/05/21  FOTO 34.8   MUSCLE LENGTH: 10/05/21  Hamstrings WNL, Piriformis moderate limitation L severe limitation R    POSTURE:  10/05/21 rounded shoulders, forward head, decreased lumbar lordosis, decreased thoracic kyphosis, and flexed trunk    PALPATION: 10/05/21  Lt lumbar paraspinals TTP, more tense than the R    LOWER EXTREMITY ROM:   Active ROM Right 10/05/21 Left 10/05/21 Right 10/24/21  Hip flexion       Hip extension       Hip abduction  Hip adduction       Hip internal rotation       Hip external rotation       Knee flexion 101 105 106  Knee extension 4 2 2  (hyper  LAQ)  Ankle dorsiflexion       Ankle plantarflexion       Ankle inversion       Ankle eversion        (Blank rows = not tested)   10/05/21 Lumbar ROM flexion WNL, extension severe limitation, lateral flexion moderate limitation    LOWER EXTREMITY MMT:   MMT Right 10/05/21 Left 10/05/21  Hip flexion 3- 3  Hip extension      Hip abduction 3- strong compensation with hip flexors  3- strong compensation with hip flexors  Hip adduction      Hip internal rotation      Hip external rotation      Knee flexion 4+ 4+  Knee extension 4+ 4+  Ankle dorsiflexion 5 5  Ankle plantarflexion      Ankle inversion      Ankle eversion       (Blank rows = not tested)   FUNCTIONAL TESTS:  11/07/21 5x STS: 11.19 sec without UEs TUG: 12.19 sec without AD (carrying crutch but not using)  10/31/2021:  tandem stance independent 15-20 seconds on several tries prior to hands required to prevent loss of balance.  Similar performance bilaterally.   10/05/21 5 times sit to stand: 21 seconds no UEs  Timed up and go (TUG): 16 seconds B crutches     GAIT: 10/18/21 Pt amb in clinic with Lt crutch with no losses of balance or instability  10/05/21 Distance walked: in clinic distances  Assistive device utilized: Crutches Level of assistance: Modified independence Comments: reciprocal pattern with B crutches, flexed at hips, wide BOS        TODAY'S TREATMENT: 11/14/21 TherEx: NuStep L6 x 10 min; LEs only Knee extension machine RLE only 3x10; 5# Knee flexion machine RLE 3x10; 15# Tandem stand 3x30 sec bil (using mirror for postural awareness) Tandem gait with visual feedback to work on decreasing Trendelenburg gait Rt lateral step up onto 6" step with Lt hip abduction hold x 2 sec x10 reps Leg Press single limb 50# 2x15 (performed bil); eccentric focus  11/07/21 TherEx: NuStep L6 x 10 min; LEs only Leg Press single limb 50# 2x15 (performed bil); eccentric focus Knee extension machine RLE only 2x10; 5# Standing hip abduction L3 band x20 reps bil Standing hip extension L3 band x20 reps bil  Physical Performance Test TUG and 5x STS as noted above; reviewed results with pt  Gait Training: In // bars with intermittent UE support and mirror feedback working on posture and decreasing Rt lean in Rt stance   10/31/2021 Therex: Seated LAQ Rt full range with end range pause 3 lbs 2 x 15 Leg extension machine double leg up, Rt leg lowering slowly 5 lbs 2 x 10  Leg curl machine Rt leg only x 15 10 lbs Nustep lvl 5 LE only 10 mins  TherActivity (to improve walking, stairs, transfers) Sit to stand to sit 18 inch table height no UE assist x 5 Leg press single leg 50 lbs 2 x 15 bilateral slow lowering focus  Neuro Re-ed Tandem stance 1 min x 1 bilateral Alt toe/heel raises x 20 each way occasional hand assist Retro ambulation in // bars 10 ft x 5 alternated with fwd walking for improved WB Rt leg  10/24/21 TherEx: NuStep L4 x 8 min; LEs only Leg Press 100# bil 3x10; 50# single limb (performed bil) 3x10 Supine AA heel slides x 10  reps AROM measurements as noted above Seated Rt SLR 2x10 Sit to/from stand without UE support 2x10 Knee extension machine 5# (RLE only, min A needed from LLE) 2x10 Knee flexion machine 15# (RLE only, occasional A from LLE) 2x10  10/20/21 TherEx  Nustep L3 BLEs x 8 min  Leg press 45* BLEs 75# x 10, 100# 2 x 10  Standing rows green band 2 x 12  Standing shoulder extension green band 2 x 12, increased arm fatigue  Seated multifidus lift 5s hold x 10  Seated abdominal press 5s hold x 10  Seated pelvic tilts with cues for abdominal squeeze x 15  Neuro Re-ed  Static stance eyes open narrow BOS x 30s Heel/toe rises x 15 ea SBA Tandem stance eyes open x 15s RLE posterior with increased Rt knee pain, x 30s LLE posterior  Static stance eyes closed 2 x 30s, multiple instances of UE assist to maintain balance  Static stance on foam normal BOS eyes open x 30s  A-P weight shifting on foam x 1.5 min  Static stance on foam normal BOS eyes closed x 30s SBA with increased sway, one instance of CGA while regaining balance     PATIENT EDUCATION:  10/05/21 Education details: exam findings, POC, HEP  Person educated: Patient Education method: Explanation, Demonstration, and Handouts Education comprehension: verbalized understanding and returned demonstration     HOME EXERCISE PROGRAM: Access Code: [email or web address] URL: [email or web address] Date: 10/18/2021 Prepared by: Scot Jun  Exercises - Seated Hip Abduction with Resistance  - 1-2 x daily - 7 x weekly - 1 sets - 15 reps - 1 hold - Seated March with Resistance  - 1-2 x daily - 7 x weekly - 1 sets - 10-15 reps - 1 hold - Seated Transversus Abdominis Bracing  - 3 x daily - 7 x weekly - 1 sets - 20 reps - 3 hold - Seated Straight Leg Heel Taps  - 1-2 x daily - 7 x weekly - 3 sets - 10 reps - Sit to Stand  - 3 x daily - 7 x weekly - 1 sets - 10 reps - Clamshell  - 1 x daily - 7 x weekly - 2-3 sets - 10 reps   ASSESSMENT:    CLINICAL IMPRESSION: Pt tolerated session well today with main focus of strengthening and endurance.  Will continue to benefit from PT to maximize function.  OBJECTIVE IMPAIRMENTS Abnormal gait, decreased balance, decreased endurance, decreased knowledge of use of DME, decreased mobility, difficulty walking, decreased ROM, decreased strength, increased fascial restrictions, increased muscle spasms, impaired flexibility, postural dysfunction, obesity, and pain.    ACTIVITY LIMITATIONS carrying, lifting, standing, squatting, stairs, transfers, and locomotion level   PARTICIPATION LIMITATIONS: meal prep, cleaning, laundry, interpersonal relationship, driving, shopping, community activity, occupation, and yard work   PERSONAL FACTORS Age, Behavior pattern, Fitness, Past/current experiences, and Time since onset of injury/illness/exacerbation are also affecting patient's functional outcome.    REHAB POTENTIAL: Good   CLINICAL DECISION MAKING: Evolving/moderate complexity   EVALUATION COMPLEXITY: Moderate     GOALS: Goals reviewed with patient? Yes   SHORT TERM GOALS: Target date: 11/09/2021  Will be compliant with appropriate progressive HEP  Baseline: Goal status: MET 11/07/21   2.  Will have better understanding of posture and ergonomics  Baseline:  Goal status:  MET 11/07/21   3.  Will be able to ambulate up to 345f with U crutch or SPC without unsteadiness or increased pain  Baseline:  Goal status:  MET 11/07/21   4.  Lumbar ROM to be minimally impaired on all planes of motion  Baseline:  Goal status:  on going - assessed 10/31/2021     LONG TERM GOALS: Target date: 12/14/2021    MMT to improve by at least 1 grade in all weak groups  Baseline:  Goal status: on going - assessed 10/31/2021   2.  Will be able to complete 5x sit to stand in 15 seconds or less without UEs  Baseline:  Goal status: MET 11/07/21   3.  Will be able to complete TUG in 12 seconds or less with LRAD   Baseline:  Goal status: on going - assessed 11/07/2021   4.  Will be able to ambulate unlimited community distances with LRAD and back pain no more than 2/10 Baseline:  Goal status: on going - assessed 10/31/2021   5.  Will be able to ambulate distances of 200-3013fwith no device and no increased pain or unsteadiness  Baseline:  Goal status: on going - assessed 10/31/2021   6.  Will tolerate return to work for at least a 4 hour shift without increase in pain  Baseline:  Goal status: on going - assessed 10/31/2021     PLAN: PT FREQUENCY: 2x/week   PT DURATION: 10 weeks   PLANNED INTERVENTIONS: Therapeutic exercises, Therapeutic activity, Neuromuscular re-education, Balance training, Gait training, Patient/Family education, Self Care, Joint mobilization, Stair training, DME instructions, Aquatic Therapy, Dry Needling, Electrical stimulation, Spinal mobilization, Cryotherapy, Moist heat, Taping, Vasopneumatic device, Ultrasound, Ionotophoresis 25m37ml Dexamethasone, Manual therapy, and Re-evaluation   PLAN FOR NEXT SESSION: hip strengthening, work on posture with gait,  Static balance interventions.    SteLaureen AbrahamsT, DPT 11/14/21 10:10 AM

## 2021-11-16 ENCOUNTER — Ambulatory Visit (INDEPENDENT_AMBULATORY_CARE_PROVIDER_SITE_OTHER): Payer: No Typology Code available for payment source | Admitting: Physical Therapy

## 2021-11-16 ENCOUNTER — Encounter: Payer: Self-pay | Admitting: Physical Therapy

## 2021-11-16 DIAGNOSIS — M6281 Muscle weakness (generalized): Secondary | ICD-10-CM

## 2021-11-16 DIAGNOSIS — R2681 Unsteadiness on feet: Secondary | ICD-10-CM

## 2021-11-16 DIAGNOSIS — R262 Difficulty in walking, not elsewhere classified: Secondary | ICD-10-CM | POA: Diagnosis not present

## 2021-11-16 DIAGNOSIS — M5459 Other low back pain: Secondary | ICD-10-CM

## 2021-11-16 NOTE — Therapy (Signed)
OUTPATIENT PHYSICAL THERAPY TREATMENT NOTE   Patient Name: [name] MRN: [id number] DOB:[birth year], 61 y.o., female Today's Date: 11/16/2021  PCP: Bertram Millard MD  REFERRING PROVIDER: [name], MD   END OF SESSION:   PT End of Session - 11/16/21 0935     Visit Number 8    Number of Visits 21    Date for PT Re-Evaluation 12/14/21    Authorization Type [employer]    Authorization Time Period 10/05/21 to 12/14/21    PT Start Time 0930    PT Stop Time 1008    PT Time Calculation (min) 38 min    Activity Tolerance Patient tolerated treatment well    Behavior During Therapy [facility] for tasks assessed/performed                 Past Medical History:  Diagnosis Date   Anemia    Arthritis    Atrial fibrillation ([place])    Bleeding ulcer 12-2010  hospitalized for 1 week   Blood transfusion    Blood transfusion without reported diagnosis    2011-2 units transfused, bleeding ulcer   Bruises easily    CHF (congestive heart failure) ([place])    with PE resolved after    Complication of anesthesia    Constipation    Difficulty in swallowing 03/24/2013   no longer a problem   Diverticulitis    DVT (deep venous thrombosis) ([place]) 06/2013   right leg   Dysrhythmia     Hx of A fib    GERD (gastroesophageal reflux disease)    Hyperlipidemia    Leg swelling 03/24/2013   not a problem any longer   Osteoarthritis    Peripheral vascular disease ([place])    Pre-diabetes    Pulmonary embolus ([place]) 06/2013   Reflux    Shortness of breath    with PE   Vomiting 03/24/2013   problem resolved after lap band revision   Past Surgical History:  Procedure Laterality Date   APPENDECTOMY     [place]     CHOLECYSTECTOMY     4'82   COLONOSCOPY N/A 04/10/2013   Procedure: COLONOSCOPY;  Surgeon: Beryle Beams, MD;  Location: [facility];  Service: Endoscopy;  Laterality: N/A;   DENTAL SURGERY  10/2019   tooth implant   DIAGNOSTIC LAPAROSCOPY     ENDOVENOUS  ABLATION SAPHENOUS VEIN W/ LASER  06/2008   ENDOVENOUS ABLATION SAPHENOUS VEIN W/ LASER Left 12/09/2013   EVLA LEFT GREATER SAPHENOUS VEIN BY TODD EARLY MD   ESOPHAGOGASTRODUODENOSCOPY  12/18/2010   Procedure: ESOPHAGOGASTRODUODENOSCOPY (EGD);  Surgeon: Gatha Mayer, MD;  Location: [facility];  Service: Endoscopy;  Laterality: N/A;   I & D KNEE WITH POLY EXCHANGE Right 01/06/2021   Procedure: IRRIGATION AND DEBRIDEMENT RIGHT KNEE WITH  POLY EXCHANGE;  Surgeon: [name], MD;  Location: [place];  Service: Orthopedics;  Laterality: Right;   JOINT REPLACEMENT Left    knee   LAPAROSCOPIC GASTRIC BANDING  01/05/2008   LAPAROSCOPIC GASTRIC BANDING  12/18/2010   01/04/2009   SALPHINGOOOPHORECTOMY Right    cyst removal   TONSILLECTOMY     TOTAL KNEE ARTHROPLASTY Left 07/31/2016   Procedure: LEFT TOTAL KNEE ARTHROPLASTY;  Surgeon: [name], MD;  Location: [place];  Service: Orthopedics;  Laterality: Left;   TOTAL KNEE ARTHROPLASTY Right 12/23/2020   Procedure: RIGHT TOTAL KNEE ARTHROPLASTY;  Surgeon: [name], MD;  Location: [facility];  Service: Orthopedics;  Laterality: Right;  WEDGE RESECTION OF OVARY  LEFT OVARY    Patient Active Problem List   Diagnosis Date Noted   Tachypnea 01/03/2021   Cellulitis of right knee 01/03/2021   Chronic anticoagulation 01/03/2021   Status post total right knee replacement 12/23/2020   Unilateral primary osteoarthritis, right knee 04/07/2020   Leg edema 08/19/2018   Presence of left artificial knee joint 08/14/2016   Unilateral primary osteoarthritis, left knee 07/31/2016   Status post total knee replacement, left 07/31/2016   Abdominal hematoma    Post-operative state    Paroxysmal atrial fibrillation ([place])    Controlled type 2 diabetes mellitus with diabetic nephropathy ([place])    Wound infection 01/25/2016   Hydronephrosis, left 01/25/2016   Hydronephrosis, right 01/25/2016   DVT (deep venous thrombosis) ([place])  01/25/2016   Atrial fibrillation ([place]) 01/25/2016   Diabetes mellitus with complication ([place])    Wound infection after surgery, initial encounter    Varicose veins of lower extremities with complications 41/28/7867   Pain and swelling of left lower leg 10/27/2013   Pain of left lower extremity 10/27/2013   Gastroenteritis, acute 09/08/2013   Fever, unspecified 09/04/2013   ASD (atrial septal defect) 08/13/2013   Pulmonary embolism ([place]) 06/22/2013   Dyspnea 06/11/2013   Anterior slip repaired Oct 2012 01/18/2011   GI bleed 12/18/2010   Diabetes mellitus ([place]) 12/18/2010   Varicose veins of lower extremities with other complications 67/20/9470   Diabetes mellitus    HIP PAIN, RIGHT 03/06/2010    REFERRING DIAG: T84.53XD (ICD-10-CM) - Infection of total right knee replacement, subsequent encounter Z96.651 (ICD-10-CM) - Status post total right knee replacement   THERAPY DIAG:  Muscle weakness (generalized)  Unsteadiness on feet  Difficulty in walking, not elsewhere classified  Other low back pain  Rationale for Evaluation and Treatment Rehabilitation  ONSET DATE: 09/08/2021    SUBJECTIVE:    SUBJECTIVE STATEMENT: Lt hip and Rt knee are more aching today, using crutch   PERTINENT HISTORY: follow-up as a relates to a MRSA infection in the right total knee arthroplasty.   PAIN:  NPRS scale: 4/10 Pain location: Lt hip and Rt knee Pain description: tightness/stiffness Aggravating factors: riding in car back from beach Relieving factors: moving around some   PRECAUTIONS: None   WEIGHT BEARING RESTRICTIONS No   FALLS:  Has patient fallen in last 6 months? No   LIVING ENVIRONMENT: Lives with: lives with their spouse Lives in: House/apartment Stairs: 1 STE with U rail, no steps inside  Has following equipment at home: Single point cane, Walker - 2 wheeled, Crutches, bed side commode, and walking poles   OCCUPATION: nurse at [employer]: Independent,  Independent with basic ADLs, Independent with gait, and Independent with transfers   PATIENT GOALS improve function in general, be able to walk without crutches, decrease pain      OBJECTIVE:    PATIENT SURVEYS:  11/07/21: FOTO 49 10/05/21  FOTO 34.8   MUSCLE LENGTH: 10/05/21  Hamstrings WNL, Piriformis moderate limitation L severe limitation R    POSTURE:  10/05/21 rounded shoulders, forward head, decreased lumbar lordosis, decreased thoracic kyphosis, and flexed trunk    PALPATION: 10/05/21  Lt lumbar paraspinals TTP, more tense than the R    LOWER EXTREMITY ROM:   Active ROM Right 10/05/21 Left 10/05/21 Right 10/24/21  Hip flexion       Hip extension       Hip abduction       Hip adduction  Hip internal rotation       Hip external rotation       Knee flexion 101 105 106  Knee extension 4 2 2  (hyper  LAQ)  Ankle dorsiflexion       Ankle plantarflexion       Ankle inversion       Ankle eversion        (Blank rows = not tested)   10/05/21 Lumbar ROM flexion WNL, extension severe limitation, lateral flexion moderate limitation    LOWER EXTREMITY MMT:   MMT Right 10/05/21 Left 10/05/21  Hip flexion 3- 3  Hip extension      Hip abduction 3- strong compensation with hip flexors  3- strong compensation with hip flexors  Hip adduction      Hip internal rotation      Hip external rotation      Knee flexion 4+ 4+  Knee extension 4+ 4+  Ankle dorsiflexion 5 5  Ankle plantarflexion      Ankle inversion      Ankle eversion       (Blank rows = not tested)   FUNCTIONAL TESTS:  11/07/21 5x STS: 11.19 sec without UEs TUG: 12.19 sec without AD (carrying crutch but not using)  10/31/2021:  tandem stance independent 15-20 seconds on several tries prior to hands required to prevent loss of balance.  Similar performance bilaterally.   10/05/21 5 times sit to stand: 21 seconds no UEs  Timed up and go (TUG): 16 seconds B crutches    GAIT: 10/18/21 Pt amb in clinic with Lt  crutch with no losses of balance or instability  10/05/21 Distance walked: in clinic distances  Assistive device utilized: Crutches Level of assistance: Modified independence Comments: reciprocal pattern with B crutches, flexed at hips, wide BOS        TODAY'S TREATMENT: 11/16/21 TherEx: NuStep L6 x 10 min; LEs only Leg Press single limb 50# 2x15 (performed bil); eccentric focus Forward step ups onto 4" step x10 reps bi; intermittent UE support needed Lateral heel taps off 4" step x 10 bil, UE support needed for RLE, intermittent UE support needed for LLE Seated SLR 2# x10 reps bil Sit to/from stand x 10 reps without UE support Standing hip abduction x10 reps bil; 2# Standing hip extension x 10 reps bil; 2# Calf raises x 20 reps Squats 2x10  11/14/21 TherEx: NuStep L6 x 10 min; LEs only Knee extension machine RLE only 3x10; 5# Knee flexion machine RLE 3x10; 15# Tandem stand 3x30 sec bil (using mirror for postural awareness) Tandem gait with visual feedback to work on decreasing Trendelenburg gait Rt lateral step up onto 6" step with Lt hip abduction hold x 2 sec x10 reps Leg Press single limb 50# 2x15 (performed bil); eccentric focus  11/07/21 TherEx: NuStep L6 x 10 min; LEs only Leg Press single limb 50# 2x15 (performed bil); eccentric focus Knee extension machine RLE only 2x10; 5# Standing hip abduction L3 band x20 reps bil Standing hip extension L3 band x20 reps bil  Physical Performance Test TUG and 5x STS as noted above; reviewed results with pt  Gait Training: In // bars with intermittent UE support and mirror feedback working on posture and decreasing Rt lean in Rt stance   10/31/2021 Therex: Seated LAQ Rt full range with end range pause 3 lbs 2 x 15 Leg extension machine double leg up, Rt leg lowering slowly 5 lbs 2 x 10  Leg curl machine Rt leg only x 15  10 lbs Nustep lvl 5 LE only 10 mins  TherActivity (to improve walking, stairs, transfers) Sit to  stand to sit 18 inch table height no UE assist x 5 Leg press single leg 50 lbs 2 x 15 bilateral slow lowering focus  Neuro Re-ed Tandem stance 1 min x 1 bilateral Alt toe/heel raises x 20 each way occasional hand assist Retro ambulation in // bars 10 ft x 5 alternated with fwd walking for improved WB Rt leg     PATIENT EDUCATION:  10/05/21 Education details: exam findings, POC, HEP  Person educated: Patient Education method: Consulting civil engineer, Demonstration, and Handouts Education comprehension: verbalized understanding and returned demonstration     HOME EXERCISE PROGRAM: Access Code: [email or web address] URL: [email or web address] Date: 10/18/2021 Prepared by: Scot Jun  Exercises - Seated Hip Abduction with Resistance  - 1-2 x daily - 7 x weekly - 1 sets - 15 reps - 1 hold - Seated March with Resistance  - 1-2 x daily - 7 x weekly - 1 sets - 10-15 reps - 1 hold - Seated Transversus Abdominis Bracing  - 3 x daily - 7 x weekly - 1 sets - 20 reps - 3 hold - Seated Straight Leg Heel Taps  - 1-2 x daily - 7 x weekly - 3 sets - 10 reps - Sit to Stand  - 3 x daily - 7 x weekly - 1 sets - 10 reps - Clamshell  - 1 x daily - 7 x weekly - 2-3 sets - 10 reps   ASSESSMENT:   CLINICAL IMPRESSION: Pt with increased soreness in Lt hip and Rt knee today but reports she was lifting and moving wood yesterday.  Still able to tolerate session with expected fatigue.  Will continue to benefit from PT to maximize function.  OBJECTIVE IMPAIRMENTS Abnormal gait, decreased balance, decreased endurance, decreased knowledge of use of DME, decreased mobility, difficulty walking, decreased ROM, decreased strength, increased fascial restrictions, increased muscle spasms, impaired flexibility, postural dysfunction, obesity, and pain.    ACTIVITY LIMITATIONS carrying, lifting, standing, squatting, stairs, transfers, and locomotion level   PARTICIPATION LIMITATIONS: meal prep, cleaning, laundry,  interpersonal relationship, driving, shopping, community activity, occupation, and yard work   PERSONAL FACTORS Age, Behavior pattern, Fitness, Past/current experiences, and Time since onset of injury/illness/exacerbation are also affecting patient's functional outcome.    REHAB POTENTIAL: Good   CLINICAL DECISION MAKING: Evolving/moderate complexity   EVALUATION COMPLEXITY: Moderate     GOALS: Goals reviewed with patient? Yes   SHORT TERM GOALS: Target date: 11/09/2021  Will be compliant with appropriate progressive HEP  Baseline: Goal status: MET 11/07/21   2.  Will have better understanding of posture and ergonomics  Baseline:  Goal status:  MET 11/07/21   3.  Will be able to ambulate up to 375f with U crutch or SPC without unsteadiness or increased pain  Baseline:  Goal status:  MET 11/07/21   4.  Lumbar ROM to be minimally impaired on all planes of motion  Baseline:  Goal status:  on going - assessed 10/31/2021     LONG TERM GOALS: Target date: 12/14/2021    MMT to improve by at least 1 grade in all weak groups  Baseline:  Goal status: on going - assessed 10/31/2021   2.  Will be able to complete 5x sit to stand in 15 seconds or less without UEs  Baseline:  Goal status: MET 11/07/21   3.  Will be able to complete TUG in 12 seconds or less  with LRAD  Baseline:  Goal status: on going - assessed 11/07/2021   4.  Will be able to ambulate unlimited community distances with LRAD and back pain no more than 2/10 Baseline:  Goal status: on going - assessed 10/31/2021   5.  Will be able to ambulate distances of 200-364f with no device and no increased pain or unsteadiness  Baseline:  Goal status: on going - assessed 10/31/2021   6.  Will tolerate return to work for at least a 4 hour shift without increase in pain  Baseline:  Goal status: on going - assessed 10/31/2021     PLAN: PT FREQUENCY: 2x/week   PT DURATION: 10 weeks   PLANNED INTERVENTIONS: Therapeutic  exercises, Therapeutic activity, Neuromuscular re-education, Balance training, Gait training, Patient/Family education, Self Care, Joint mobilization, Stair training, DME instructions, Aquatic Therapy, Dry Needling, Electrical stimulation, Spinal mobilization, Cryotherapy, Moist heat, Taping, Vasopneumatic device, Ultrasound, Ionotophoresis 438mml Dexamethasone, Manual therapy, and Re-evaluation   PLAN FOR NEXT SESSION: hip strengthening, work on posture with gait,  Static balance interventions.    StLaureen AbrahamsPT, DPT 11/16/21 10:09 AM

## 2021-11-21 ENCOUNTER — Ambulatory Visit (INDEPENDENT_AMBULATORY_CARE_PROVIDER_SITE_OTHER): Payer: No Typology Code available for payment source | Admitting: Physical Therapy

## 2021-11-21 ENCOUNTER — Encounter: Payer: Self-pay | Admitting: Physical Therapy

## 2021-11-21 DIAGNOSIS — R262 Difficulty in walking, not elsewhere classified: Secondary | ICD-10-CM | POA: Diagnosis not present

## 2021-11-21 DIAGNOSIS — M6281 Muscle weakness (generalized): Secondary | ICD-10-CM | POA: Diagnosis not present

## 2021-11-21 DIAGNOSIS — R2681 Unsteadiness on feet: Secondary | ICD-10-CM | POA: Diagnosis not present

## 2021-11-21 DIAGNOSIS — M5459 Other low back pain: Secondary | ICD-10-CM

## 2021-11-21 NOTE — Therapy (Signed)
OUTPATIENT PHYSICAL THERAPY TREATMENT NOTE   Patient Name: [name] MRN: [id number] DOB:Dec 04, 1960, 61 y.o., female Today's Date: 11/21/2021  PCP: Bertram Millard MD  REFERRING PROVIDER: [name], MD   END OF SESSION:   PT End of Session - 11/21/21 0932     Visit Number 9    Number of Visits 21    Date for PT Re-Evaluation 12/14/21    Authorization Type [employer]    Authorization Time Period 10/05/21 to 12/14/21    PT Start Time 0930    PT Stop Time 1008    PT Time Calculation (min) 38 min    Activity Tolerance Patient tolerated treatment well    Behavior During Therapy [facility] for tasks assessed/performed                  Past Medical History:  Diagnosis Date   Anemia    Arthritis    Atrial fibrillation ([place])    Bleeding ulcer 12-2010  hospitalized for 1 week   Blood transfusion    Blood transfusion without reported diagnosis    2011-2 units transfused, bleeding ulcer   Bruises easily    CHF (congestive heart failure) ([place])    with PE resolved after    Complication of anesthesia    Constipation    Difficulty in swallowing 03/24/2013   no longer a problem   Diverticulitis    DVT (deep venous thrombosis) ([place]) 06/2013   right leg   Dysrhythmia     Hx of A fib    GERD (gastroesophageal reflux disease)    Hyperlipidemia    Leg swelling 03/24/2013   not a problem any longer   Osteoarthritis    Peripheral vascular disease ([place])    Pre-diabetes    Pulmonary embolus ([place]) 06/2013   Reflux    Shortness of breath    with PE   Vomiting 03/24/2013   problem resolved after lap band revision   Past Surgical History:  Procedure Laterality Date   APPENDECTOMY     [place]     CHOLECYSTECTOMY     9'03   COLONOSCOPY N/A 04/10/2013   Procedure: COLONOSCOPY;  Surgeon: Beryle Beams, MD;  Location: [facility];  Service: Endoscopy;  Laterality: N/A;   DENTAL SURGERY  10/2019   tooth implant   DIAGNOSTIC LAPAROSCOPY      ENDOVENOUS ABLATION SAPHENOUS VEIN W/ LASER  06/2008   ENDOVENOUS ABLATION SAPHENOUS VEIN W/ LASER Left 12/09/2013   EVLA LEFT GREATER SAPHENOUS VEIN BY TODD EARLY MD   ESOPHAGOGASTRODUODENOSCOPY  12/18/2010   Procedure: ESOPHAGOGASTRODUODENOSCOPY (EGD);  Surgeon: Gatha Mayer, MD;  Location: [facility];  Service: Endoscopy;  Laterality: N/A;   I & D KNEE WITH POLY EXCHANGE Right 01/06/2021   Procedure: IRRIGATION AND DEBRIDEMENT RIGHT KNEE WITH  POLY EXCHANGE;  Surgeon: [name], MD;  Location: [place];  Service: Orthopedics;  Laterality: Right;   JOINT REPLACEMENT Left    knee   LAPAROSCOPIC GASTRIC BANDING  01/05/2008   LAPAROSCOPIC GASTRIC BANDING  12/18/2010   01/04/2009   SALPHINGOOOPHORECTOMY Right    cyst removal   TONSILLECTOMY     TOTAL KNEE ARTHROPLASTY Left 07/31/2016   Procedure: LEFT TOTAL KNEE ARTHROPLASTY;  Surgeon: [name], MD;  Location: [place];  Service: Orthopedics;  Laterality: Left;   TOTAL KNEE ARTHROPLASTY Right 12/23/2020   Procedure: RIGHT TOTAL KNEE ARTHROPLASTY;  Surgeon: [name], MD;  Location: [facility];  Service: Orthopedics;  Laterality: Right;  WEDGE RESECTION OF OVARY  LEFT OVARY    Patient Active Problem List   Diagnosis Date Noted   Tachypnea 01/03/2021   Cellulitis of right knee 01/03/2021   Chronic anticoagulation 01/03/2021   Status post total right knee replacement 12/23/2020   Unilateral primary osteoarthritis, right knee 04/07/2020   Leg edema 08/19/2018   Presence of left artificial knee joint 08/14/2016   Unilateral primary osteoarthritis, left knee 07/31/2016   Status post total knee replacement, left 07/31/2016   Abdominal hematoma    Post-operative state    Paroxysmal atrial fibrillation ([place])    Controlled type 2 diabetes mellitus with diabetic nephropathy ([place])    Wound infection 01/25/2016   Hydronephrosis, left 01/25/2016   Hydronephrosis, right 01/25/2016   DVT (deep venous  thrombosis) ([place]) 01/25/2016   Atrial fibrillation ([place]) 01/25/2016   Diabetes mellitus with complication ([place])    Wound infection after surgery, initial encounter    Varicose veins of lower extremities with complications 69/48/5462   Pain and swelling of left lower leg 10/27/2013   Pain of left lower extremity 10/27/2013   Gastroenteritis, acute 09/08/2013   Fever, unspecified 09/04/2013   ASD (atrial septal defect) 08/13/2013   Pulmonary embolism ([employer]) 06/22/2013   Dyspnea 06/11/2013   Anterior slip repaired Oct 2012 01/18/2011   GI bleed 12/18/2010   Diabetes mellitus ([place]) 12/18/2010   Varicose veins of lower extremities with other complications 70/35/0093   Diabetes mellitus    HIP PAIN, RIGHT 03/06/2010    REFERRING DIAG: T84.53XD (ICD-10-CM) - Infection of total right knee replacement, subsequent encounter Z96.651 (ICD-10-CM) - Status post total right knee replacement   THERAPY DIAG:  Muscle weakness (generalized)  Unsteadiness on feet  Difficulty in walking, not elsewhere classified  Other low back pain  Rationale for Evaluation and Treatment Rehabilitation  ONSET DATE: 09/08/2021    SUBJECTIVE:    SUBJECTIVE STATEMENT: Went back to work yesterday; was really stiff this morning; had a little pain but mainly stiffness.   PERTINENT HISTORY: follow-up as a relates to a MRSA infection in the right total knee arthroplasty.   PAIN:  NPRS scale: 2/10 Pain location: Lt hip and Rt knee Pain description: tightness/stiffness Aggravating factors: riding in car back from beach Relieving factors: moving around some   PRECAUTIONS: None   WEIGHT BEARING RESTRICTIONS No   FALLS:  Has patient fallen in last 6 months? No   LIVING ENVIRONMENT: Lives with: lives with their spouse Lives in: House/apartment Stairs: 1 STE with U rail, no steps inside  Has following equipment at home: Single point cane, Walker - 2 wheeled, Crutches, bed side commode, and walking poles    OCCUPATION: nurse at [employer]: Independent, Independent with basic ADLs, Independent with gait, and Independent with transfers   PATIENT GOALS improve function in general, be able to walk without crutches, decrease pain      OBJECTIVE:    PATIENT SURVEYS:  11/07/21: FOTO 49 10/05/21  FOTO 34.8   MUSCLE LENGTH: 10/05/21  Hamstrings WNL, Piriformis moderate limitation L severe limitation R    POSTURE:  10/05/21 rounded shoulders, forward head, decreased lumbar lordosis, decreased thoracic kyphosis, and flexed trunk    PALPATION: 10/05/21  Lt lumbar paraspinals TTP, more tense than the R    LOWER EXTREMITY ROM:   Active ROM Right 10/05/21 Left 10/05/21 Right 10/24/21  Hip flexion       Hip extension       Hip abduction  Hip adduction       Hip internal rotation       Hip external rotation       Knee flexion 101 105 106  Knee extension _0 (hyper  LAQ)  Ankle dorsiflexion       Ankle plantarflexion       Ankle inversion       Ankle eversion        (Blank rows = not tested)   10/05/21 Lumbar ROM flexion WNL, extension severe limitation, lateral flexion moderate limitation    LOWER EXTREMITY MMT:   MMT Right 10/05/21 Left 10/05/21  Hip flexion 3- 3  Hip extension      Hip abduction 3- strong compensation with hip flexors  3- strong compensation with hip flexors  Hip adduction      Hip internal rotation      Hip external rotation      Knee flexion 4+ 4+  Knee extension 4+ 4+  Ankle dorsiflexion 5 5  Ankle plantarflexion      Ankle inversion      Ankle eversion       (Blank rows = not tested)   FUNCTIONAL TESTS:  11/07/21 5x STS: 11.19 sec without UEs TUG: 12.19 sec without AD (carrying crutch but not using)  10/31/2021:  tandem stance independent 15-20 seconds on several tries prior to hands required to prevent loss of balance.  Similar performance bilaterally.   10/05/21 5 times sit to stand: 21 seconds no UEs  Timed up and go (TUG): 16  seconds B crutches    GAIT: 10/18/21 Pt amb in clinic with Lt crutch with no losses of balance or instability  10/05/21 Distance walked: in clinic distances  Assistive device utilized: Crutches Level of assistance: Modified independence Comments: reciprocal pattern with B crutches, flexed at hips, wide BOS        TODAY'S TREATMENT: 11/21/21 TherEx: NuStep L6 x 10 min; LEs only Knee extension machine RLE only 3x10; 5# Knee flexion machine RLE 3x10; 15# Leg Press single limb 50# 3x15 (performed bil) Sidestepping with L4 band x 5 laps in // bars; intermittent UE support Monster walk x 5 laps in // bars Heel taps off 4" block with RLE on block 3x10 Side step up onto 4" block with contralateral hip abduction 2x10 bil   11/16/21 TherEx: NuStep L6 x 10 min; LEs only Leg Press single limb 50# 2x15 (performed bil); eccentric focus Forward step ups onto 4" step x10 reps bi; intermittent UE support needed Lateral heel taps off 4" step x 10 bil, UE support needed for RLE, intermittent UE support needed for LLE Seated SLR 2# x10 reps bil Sit to/from stand x 10 reps without UE support Standing hip abduction x10 reps bil; 2# Standing hip extension x 10 reps bil; 2# Calf raises x 20 reps Squats 2x10  11/14/21 TherEx: NuStep L6 x 10 min; LEs only Knee extension machine RLE only 3x10; 5# Knee flexion machine RLE 3x10; 15# Tandem stand 3x30 sec bil (using mirror for postural awareness) Tandem gait with visual feedback to work on decreasing Trendelenburg gait Rt lateral step up onto 6" step with Lt hip abduction hold x 2 sec x10 reps Leg Press single limb 50# 2x15 (performed bil); eccentric focus      PATIENT EDUCATION:  10/05/21 Education details: exam findings, POC, HEP  Person educated: Patient Education method: Explanation, Demonstration, and Handouts Education comprehension: verbalized understanding and returned demonstration     HOME EXERCISE PROGRAM: Access  Code:  [email or web address] URL: [email or web address] Date: 10/18/2021 Prepared by: Scot Jun  Exercises - Seated Hip Abduction with Resistance  - 1-2 x daily - 7 x weekly - 1 sets - 15 reps - 1 hold - Seated March with Resistance  - 1-2 x daily - 7 x weekly - 1 sets - 10-15 reps - 1 hold - Seated Transversus Abdominis Bracing  - 3 x daily - 7 x weekly - 1 sets - 20 reps - 3 hold - Seated Straight Leg Heel Taps  - 1-2 x daily - 7 x weekly - 3 sets - 10 reps - Sit to Stand  - 3 x daily - 7 x weekly - 1 sets - 10 reps - Clamshell  - 1 x daily - 7 x weekly - 2-3 sets - 10 reps   ASSESSMENT:   CLINICAL IMPRESSION: Continued focus on strengthening and activity tolerance today.  Fatigues quickly with standing activities.  Will continue to benefit from PT to maximize function.  OBJECTIVE IMPAIRMENTS Abnormal gait, decreased balance, decreased endurance, decreased knowledge of use of DME, decreased mobility, difficulty walking, decreased ROM, decreased strength, increased fascial restrictions, increased muscle spasms, impaired flexibility, postural dysfunction, obesity, and pain.    ACTIVITY LIMITATIONS carrying, lifting, standing, squatting, stairs, transfers, and locomotion level   PARTICIPATION LIMITATIONS: meal prep, cleaning, laundry, interpersonal relationship, driving, shopping, community activity, occupation, and yard work   PERSONAL FACTORS Age, Behavior pattern, Fitness, Past/current experiences, and Time since onset of injury/illness/exacerbation are also affecting patient's functional outcome.    REHAB POTENTIAL: Good   CLINICAL DECISION MAKING: Evolving/moderate complexity   EVALUATION COMPLEXITY: Moderate     GOALS: Goals reviewed with patient? Yes   SHORT TERM GOALS: Target date: 11/09/2021  Will be compliant with appropriate progressive HEP  Baseline: Goal status: MET 11/07/21   2.  Will have better understanding of posture and ergonomics  Baseline:  Goal status:   MET 11/07/21   3.  Will be able to ambulate up to 388f with U crutch or SPC without unsteadiness or increased pain  Baseline:  Goal status:  MET 11/07/21   4.  Lumbar ROM to be minimally impaired on all planes of motion  Baseline:  Goal status:  on going - assessed 10/31/2021     LONG TERM GOALS: Target date: 12/14/2021    MMT to improve by at least 1 grade in all weak groups  Baseline:  Goal status: on going - assessed 10/31/2021   2.  Will be able to complete 5x sit to stand in 15 seconds or less without UEs  Baseline:  Goal status: MET 11/07/21   3.  Will be able to complete TUG in 12 seconds or less with LRAD  Baseline:  Goal status: on going - assessed 11/07/2021   4.  Will be able to ambulate unlimited community distances with LRAD and back pain no more than 2/10 Baseline:  Goal status: on going - assessed 10/31/2021   5.  Will be able to ambulate distances of 200-3080fwith no device and no increased pain or unsteadiness  Baseline:  Goal status: on going - assessed 10/31/2021   6.  Will tolerate return to work for at least a 4 hour shift without increase in pain  Baseline:  Goal status: on going - assessed 10/31/2021     PLAN: PT FREQUENCY: 2x/week   PT DURATION: 10 weeks   PLANNED INTERVENTIONS: Therapeutic exercises, Therapeutic activity, Neuromuscular re-education, Balance training, Gait training, Patient/Family education, Self Care,  Joint mobilization, Stair training, DME instructions, Aquatic Therapy, Dry Needling, Electrical stimulation, Spinal mobilization, Cryotherapy, Moist heat, Taping, Vasopneumatic device, Ultrasound, Ionotophoresis 89m/ml Dexamethasone, Manual therapy, and Re-evaluation   PLAN FOR NEXT SESSION: check lumbar ROM,  hip strengthening, work on posture with gait,  Static balance interventions.    SLaureen Abrahams PT, DPT 11/21/21 10:09 AM

## 2021-11-23 ENCOUNTER — Encounter: Payer: No Typology Code available for payment source | Admitting: Rehabilitative and Restorative Service Providers"

## 2021-11-28 ENCOUNTER — Ambulatory Visit (INDEPENDENT_AMBULATORY_CARE_PROVIDER_SITE_OTHER): Payer: No Typology Code available for payment source | Admitting: Rehabilitative and Restorative Service Providers"

## 2021-11-28 ENCOUNTER — Encounter: Payer: Self-pay | Admitting: Rehabilitative and Restorative Service Providers"

## 2021-11-28 ENCOUNTER — Other Ambulatory Visit: Payer: Self-pay | Admitting: Cardiovascular Disease

## 2021-11-28 DIAGNOSIS — R262 Difficulty in walking, not elsewhere classified: Secondary | ICD-10-CM

## 2021-11-28 DIAGNOSIS — M6281 Muscle weakness (generalized): Secondary | ICD-10-CM | POA: Diagnosis not present

## 2021-11-28 DIAGNOSIS — R2681 Unsteadiness on feet: Secondary | ICD-10-CM

## 2021-11-28 DIAGNOSIS — M5459 Other low back pain: Secondary | ICD-10-CM | POA: Diagnosis not present

## 2021-11-28 NOTE — Therapy (Signed)
OUTPATIENT PHYSICAL THERAPY TREATMENT NOTE/ PROGRESS NOTE   Patient Name: DENISHIA CITRO MRN: [id number] DOB:January 22, 1961, 61 y.o., female Today's Date: 11/28/2021  PCP: Bertram Millard MD  REFERRING PROVIDER: [name], MD   Progress Note Reporting Period 10/05/2021 to 11/28/2021  See note below for Objective Data and Assessment of Progress/Goals.      END OF SESSION:   PT End of Session - 11/28/21 0907     Visit Number 10    Number of Visits 21    Date for PT Re-Evaluation 12/14/21    Authorization Type [employer]    Authorization Time Period 10/05/21 to 12/14/21    Progress Note Due on Visit 20    PT Start Time 0910    PT Stop Time 0949    PT Time Calculation (min) 39 min    Activity Tolerance Patient tolerated treatment well    Behavior During Therapy [facility] for tasks assessed/performed                   Past Medical History:  Diagnosis Date   Anemia    Arthritis    Atrial fibrillation ([place])    Bleeding ulcer 12-2010  hospitalized for 1 week   Blood transfusion    Blood transfusion without reported diagnosis    2011-2 units transfused, bleeding ulcer   Bruises easily    CHF (congestive heart failure) ([place])    with PE resolved after    Complication of anesthesia    Constipation    Difficulty in swallowing 03/24/2013   no longer a problem   Diverticulitis    DVT (deep venous thrombosis) ([place]) 06/2013   right leg   Dysrhythmia     Hx of A fib    GERD (gastroesophageal reflux disease)    Hyperlipidemia    Leg swelling 03/24/2013   not a problem any longer   Osteoarthritis    Peripheral vascular disease ([place])    Pre-diabetes    Pulmonary embolus ([place]) 06/2013   Reflux    Shortness of breath    with PE   Vomiting 03/24/2013   problem resolved after lap band revision   Past Surgical History:  Procedure Laterality Date   APPENDECTOMY     CARPEL TUNNEL BILATEREAL     CHOLECYSTECTOMY     5'12   COLONOSCOPY N/A 04/10/2013   Procedure:  COLONOSCOPY;  Surgeon: Beryle Beams, MD;  Location: [facility];  Service: Endoscopy;  Laterality: N/A;   DENTAL SURGERY  10/2019   tooth implant   DIAGNOSTIC LAPAROSCOPY     ENDOVENOUS ABLATION SAPHENOUS VEIN W/ LASER  06/2008   ENDOVENOUS ABLATION SAPHENOUS VEIN W/ LASER Left 12/09/2013   EVLA LEFT GREATER SAPHENOUS VEIN BY TODD EARLY MD   ESOPHAGOGASTRODUODENOSCOPY  12/18/2010   Procedure: ESOPHAGOGASTRODUODENOSCOPY (EGD);  Surgeon: Gatha Mayer, MD;  Location: [facility];  Service: Endoscopy;  Laterality: N/A;   I & D KNEE WITH POLY EXCHANGE Right 01/06/2021   Procedure: IRRIGATION AND DEBRIDEMENT RIGHT KNEE WITH  POLY EXCHANGE;  Surgeon: [name], MD;  Location: [place];  Service: Orthopedics;  Laterality: Right;   JOINT REPLACEMENT Left    knee   LAPAROSCOPIC GASTRIC BANDING  01/05/2008   LAPAROSCOPIC GASTRIC BANDING  12/18/2010   01/04/2009   SALPHINGOOOPHORECTOMY Right    cyst removal   TONSILLECTOMY     TOTAL KNEE ARTHROPLASTY Left 07/31/2016   Procedure: LEFT TOTAL KNEE ARTHROPLASTY;  Surgeon: [name], MD;  Location: [place];  Service: Orthopedics;  Laterality: Left;   TOTAL KNEE ARTHROPLASTY Right 12/23/2020   Procedure: RIGHT TOTAL KNEE ARTHROPLASTY;  Surgeon: [name], MD;  Location: [facility];  Service: Orthopedics;  Laterality: Right;   WEDGE RESECTION OF OVARY  LEFT OVARY    Patient Active Problem List   Diagnosis Date Noted   Tachypnea 01/03/2021   Cellulitis of right knee 01/03/2021   Chronic anticoagulation 01/03/2021   Status post total right knee replacement 12/23/2020   Unilateral primary osteoarthritis, right knee 04/07/2020   Leg edema 08/19/2018   Presence of left artificial knee joint 08/14/2016   Unilateral primary osteoarthritis, left knee 07/31/2016   Status post total knee replacement, left 07/31/2016   Abdominal hematoma    Post-operative state    Paroxysmal atrial fibrillation ([place])    Controlled type  2 diabetes mellitus with diabetic nephropathy ([place])    Wound infection 01/25/2016   Hydronephrosis, left 01/25/2016   Hydronephrosis, right 01/25/2016   DVT (deep venous thrombosis) ([place]) 01/25/2016   Atrial fibrillation ([place]) 01/25/2016   Diabetes mellitus with complication ([place])    Wound infection after surgery, initial encounter    Varicose veins of lower extremities with complications 20/94/7096   Pain and swelling of left lower leg 10/27/2013   Pain of left lower extremity 10/27/2013   Gastroenteritis, acute 09/08/2013   Fever, unspecified 09/04/2013   ASD (atrial septal defect) 08/13/2013   Pulmonary embolism ([place]) 06/22/2013   Dyspnea 06/11/2013   Anterior slip repaired Oct 2012 01/18/2011   GI bleed 12/18/2010   Diabetes mellitus ([place]) 12/18/2010   Varicose veins of lower extremities with other complications 28/36/6294   Diabetes mellitus    HIP PAIN, RIGHT 03/06/2010    REFERRING DIAG: T84.53XD (ICD-10-CM) - Infection of total right knee replacement, subsequent encounter Z96.651 (ICD-10-CM) - Status post total right knee replacement   THERAPY DIAG:  Muscle weakness (generalized)  Unsteadiness on feet  Difficulty in walking, not elsewhere classified  Other low back pain  Rationale for Evaluation and Treatment Rehabilitation  ONSET DATE: 09/08/2021    SUBJECTIVE:    SUBJECTIVE STATEMENT: Pt indicated continued complaints in Rt knee c prolonged sitting while at work.   Pt indicated performing independent ambulation around the house. Pt indicated using cane at times but feels more tired from cane use.   Global Rating of Change +6 a great deal better.    PERTINENT HISTORY: follow-up as a relates to a MRSA infection in the right total knee arthroplasty.   PAIN:  NPRS scale: 3-4/10 Pain location: Rt knee Pain description: tightness/stiffness Aggravating factors: sitting prolonged, walking in public Relieving factors: moving around some   PRECAUTIONS: None    WEIGHT BEARING RESTRICTIONS No   FALLS:  Has patient fallen in last 6 months? No   LIVING ENVIRONMENT: Lives with: lives with their spouse Lives in: House/apartment Stairs: 1 STE with U rail, no steps inside  Has following equipment at home: Single point cane, Walker - 2 wheeled, Crutches, bed side commode, and walking poles   OCCUPATION: nurse at [employer]: Independent, Independent with basic ADLs, Independent with gait, and Independent with transfers   PATIENT GOALS improve function in general, be able to walk without crutches, decrease pain      OBJECTIVE:    PATIENT SURVEYS:  11/28/2021: FOTO update: 56  11/07/21: FOTO 49 10/05/21  FOTO 34.8   MUSCLE LENGTH: 10/05/21  Hamstrings WNL, Piriformis moderate limitation L severe limitation R    POSTURE:  10/05/21 rounded shoulders, forward head, decreased lumbar lordosis, decreased thoracic kyphosis, and flexed trunk    PALPATION: 10/05/21  Lt lumbar paraspinals TTP, more tense than the R    LOWER EXTREMITY ROM:   Active ROM Right 10/05/21 Left 10/05/21 Right 10/24/21 Right 11/28/2021  Hip flexion        Hip extension        Hip abduction        Hip adduction        Hip internal rotation        Hip external rotation        Knee flexion 101 105 106 105  Knee extension _0 (hyper  LAQ)   Ankle dorsiflexion        Ankle plantarflexion        Ankle inversion        Ankle eversion         (Blank rows = not tested)   10/05/21 Lumbar ROM flexion WNL, extension severe limitation, lateral flexion moderate limitation    LOWER EXTREMITY MMT:   MMT Right 10/05/21 Left 10/05/21 Right 11/28/2021 Left 11/28/2021  Hip flexion 3- 3 5/5 5/5  Hip extension        Hip abduction 3- strong compensation with hip flexors  3- strong compensation with hip flexors    Hip adduction        Hip internal rotation        Hip external rotation        Knee flexion 4+ 4+ 5/5 5/5  Knee extension 4+ 4+ 4+/5 c mild pain 5/5   Ankle dorsiflexion 5 5    Ankle plantarflexion        Ankle inversion        Ankle eversion         (Blank rows = not tested)   FUNCTIONAL TESTS:  11/07/21 5x STS: 11.19 sec without UEs TUG: 12.19 sec without AD (carrying crutch but not using)  10/31/2021:  tandem stance independent 15-20 seconds on several tries prior to hands required to prevent loss of balance.  Similar performance bilaterally.   10/05/21 5 times sit to stand: 21 seconds no UEs  Timed up and go (TUG): 16 seconds B crutches    GAIT: 11/28/2021:  Arrived c axillary crutch Lt arm.  Able to perform independent ambulation within clinic c antalgic gait/variable stance phase/step lengths.   10/18/21 Pt amb in clinic with Lt crutch with no losses of balance or instability  10/05/21 Distance walked: in clinic distances  Assistive device utilized: Crutches Level of assistance: Modified independence Comments: reciprocal pattern with B crutches, flexed at hips, wide BOS        TODAY'S TREATMENT: 11/28/2021: Therex: Nustep Lvl 6 LE only 9 mins Leg press single leg 3 x 15 56 lbs Sidestepping blue band 10 ft x 4 each way in // bars Knee extension machine double leg up, single leg lowering slowly 2 x 10 10 lbs (performed bilateral)  Neuro Re-ed Alternating fwd, reverse ambulation in // bars c focus on larger steps and WB improvement 10 ft x 6 each way Retro step x 20 Rt leg posterior Fitter rocker board fwd/back light touch control x 30 each (occasional hand assist)   11/21/21 TherEx: NuStep L6 x 10 min; LEs only Knee extension machine RLE only 3x10; 5# Knee flexion machine RLE 3x10; 15# Leg Press single limb 50# 3x15 (performed bil) Sidestepping with L4 band x 5 laps in // bars; intermittent UE support  Monster walk x 5 laps in // bars Heel taps off 4" block with RLE on block 3x10 Side step up onto 4" block with contralateral hip abduction 2x10 bil   11/16/21 TherEx: NuStep L6 x 10 min; LEs only Leg Press  single limb 50# 2x15 (performed bil); eccentric focus Forward step ups onto 4" step x10 reps bi; intermittent UE support needed Lateral heel taps off 4" step x 10 bil, UE support needed for RLE, intermittent UE support needed for LLE Seated SLR 2# x10 reps bil Sit to/from stand x 10 reps without UE support Standing hip abduction x10 reps bil; 2# Standing hip extension x 10 reps bil; 2# Calf raises x 20 reps Squats 2x10    PATIENT EDUCATION:  10/05/21 Education details: exam findings, POC, HEP  Person educated: Patient Education method: Explanation, Demonstration, and Handouts Education comprehension: verbalized understanding and returned demonstration     HOME EXERCISE PROGRAM: Access Code: [email or web address] URL: [email or web address] Date: 10/18/2021 Prepared by: Scot Jun  Exercises - Seated Hip Abduction with Resistance  - 1-2 x daily - 7 x weekly - 1 sets - 15 reps - 1 hold - Seated March with Resistance  - 1-2 x daily - 7 x weekly - 1 sets - 10-15 reps - 1 hold - Seated Transversus Abdominis Bracing  - 3 x daily - 7 x weekly - 1 sets - 20 reps - 3 hold - Seated Straight Leg Heel Taps  - 1-2 x daily - 7 x weekly - 3 sets - 10 reps - Sit to Stand  - 3 x daily - 7 x weekly - 1 sets - 10 reps - Clamshell  - 1 x daily - 7 x weekly - 2-3 sets - 10 reps   ASSESSMENT:   CLINICAL IMPRESSION: Pt has attended 10 visits overall during course of treatment.  See objective data for updated information regarding current presentation.  Despite improvements, Pt does continue to present c medical necessity for continued skilled PT services at this time to reach PLOF/goals. Areas to continue to improve include Rt leg strength, ambulation independence and stability.   OBJECTIVE IMPAIRMENTS Abnormal gait, decreased balance, decreased endurance, decreased knowledge of use of DME, decreased mobility, difficulty walking, decreased ROM, decreased strength, increased fascial restrictions,  increased muscle spasms, impaired flexibility, postural dysfunction, obesity, and pain.    ACTIVITY LIMITATIONS carrying, lifting, standing, squatting, stairs, transfers, and locomotion level   PARTICIPATION LIMITATIONS: meal prep, cleaning, laundry, interpersonal relationship, driving, shopping, community activity, occupation, and yard work   PERSONAL FACTORS Age, Behavior pattern, Fitness, Past/current experiences, and Time since onset of injury/illness/exacerbation are also affecting patient's functional outcome.    REHAB POTENTIAL: Good   CLINICAL DECISION MAKING: Evolving/moderate complexity   EVALUATION COMPLEXITY: Moderate     GOALS: Goals reviewed with patient? Yes   SHORT TERM GOALS: Target date: 11/09/2021  Will be compliant with appropriate progressive HEP  Baseline: Goal status: MET 11/07/21   2.  Will have better understanding of posture and ergonomics  Baseline:  Goal status:  MET 11/07/21   3.  Will be able to ambulate up to 327f with U crutch or SPC without unsteadiness or increased pain  Baseline:  Goal status:  MET 11/07/21   4.  Lumbar ROM to be minimally impaired on all planes of motion  Baseline:  Goal status:  Partially MET     LONG TERM GOALS: Target date: 12/14/2021    MMT to improve by at least 1 grade in all weak  groups  Baseline:  Goal status: on going - assessed 11/28/2021   2.  Will be able to complete 5x sit to stand in 15 seconds or less without UEs  Baseline:  Goal status: MET 11/07/21   3.  Will be able to complete TUG in 12 seconds or less with LRAD  Baseline:  Goal status: on going - assessed 11/28/2021   4.  Will be able to ambulate unlimited community distances with LRAD and back pain no more than 2/10 Baseline:  Goal status: on going - assessed 11/28/2021   5.  Will be able to ambulate distances of 200-341f with no device and no increased pain or unsteadiness  Baseline:  Goal status: on going - assessed 11/28/2021   6.  Will  tolerate return to work for at least a 4 hour shift without increase in pain  Baseline:  Goal status: on going - assessed 11/28/2021     PLAN: PT FREQUENCY: 2x/week   PT DURATION: 10 weeks   PLANNED INTERVENTIONS: Therapeutic exercises, Therapeutic activity, Neuromuscular re-education, Balance training, Gait training, Patient/Family education, Self Care, Joint mobilization, Stair training, DME instructions, Aquatic Therapy, Dry Needling, Electrical stimulation, Spinal mobilization, Cryotherapy, Moist heat, Taping, Vasopneumatic device, Ultrasound, Ionotophoresis 440mml Dexamethasone, Manual therapy, and Re-evaluation   PLAN FOR NEXT SESSION: Continue c plan for progressive strengthening and balance improvements to help improve ambulation/functional tasks.    MiScot JunPT, DPT, OCS, ATC 11/28/21  9:47 AM

## 2021-11-30 ENCOUNTER — Ambulatory Visit (INDEPENDENT_AMBULATORY_CARE_PROVIDER_SITE_OTHER): Payer: No Typology Code available for payment source | Admitting: Physical Therapy

## 2021-11-30 ENCOUNTER — Encounter: Payer: Self-pay | Admitting: Physical Therapy

## 2021-11-30 DIAGNOSIS — R262 Difficulty in walking, not elsewhere classified: Secondary | ICD-10-CM

## 2021-11-30 DIAGNOSIS — M5459 Other low back pain: Secondary | ICD-10-CM | POA: Diagnosis not present

## 2021-11-30 DIAGNOSIS — R2681 Unsteadiness on feet: Secondary | ICD-10-CM

## 2021-11-30 DIAGNOSIS — M6281 Muscle weakness (generalized): Secondary | ICD-10-CM

## 2021-11-30 NOTE — Therapy (Signed)
OUTPATIENT PHYSICAL THERAPY TREATMENT NOTE DISCHARGE SUMMARY   Patient Name: [name] MRN: [id number] DOB:1960-12-19, 61 y.o., female Today's Date: 11/30/2021  PCP: Bertram Millard MD  REFERRING PROVIDER: [name], MD       END OF SESSION:   PT End of Session - 11/30/21 0801     Visit Number 11    Number of Visits 21    Date for PT Re-Evaluation 12/14/21    Authorization Type [employer]    Authorization Time Period 10/05/21 to 12/14/21    Progress Note Due on Visit 20    PT Start Time 0800    PT Stop Time 0833    PT Time Calculation (min) 33 min    Activity Tolerance Patient tolerated treatment well    Behavior During Therapy [facility] for tasks assessed/performed                    Past Medical History:  Diagnosis Date   Anemia    Arthritis    Atrial fibrillation ([place])    Bleeding ulcer 12-2010  hospitalized for 1 week   Blood transfusion    Blood transfusion without reported diagnosis    2011-2 units transfused, bleeding ulcer   Bruises easily    CHF (congestive heart failure) ([place])    with PE resolved after    Complication of anesthesia    Constipation    Difficulty in swallowing 03/24/2013   no longer a problem   Diverticulitis    DVT (deep venous thrombosis) ([place]) 06/2013   right leg   Dysrhythmia     Hx of A fib    GERD (gastroesophageal reflux disease)    Hyperlipidemia    Leg swelling 03/24/2013   not a problem any longer   Osteoarthritis    Peripheral vascular disease ([place])    Pre-diabetes    Pulmonary embolus ([place]) 06/2013   Reflux    Shortness of breath    with PE   Vomiting 03/24/2013   problem resolved after lap band revision   Past Surgical History:  Procedure Laterality Date   APPENDECTOMY     [place]     CHOLECYSTECTOMY     0'38   COLONOSCOPY N/A 04/10/2013   Procedure: COLONOSCOPY;  Surgeon: Beryle Beams, MD;  Location: [facility];  Service: Endoscopy;  Laterality: N/A;   DENTAL SURGERY   10/2019   tooth implant   DIAGNOSTIC LAPAROSCOPY     ENDOVENOUS ABLATION SAPHENOUS VEIN W/ LASER  06/2008   ENDOVENOUS ABLATION SAPHENOUS VEIN W/ LASER Left 12/09/2013   EVLA LEFT GREATER SAPHENOUS VEIN BY TODD EARLY MD   ESOPHAGOGASTRODUODENOSCOPY  12/18/2010   Procedure: ESOPHAGOGASTRODUODENOSCOPY (EGD);  Surgeon: Gatha Mayer, MD;  Location: [facility];  Service: Endoscopy;  Laterality: N/A;   I & D KNEE WITH POLY EXCHANGE Right 01/06/2021   Procedure: IRRIGATION AND DEBRIDEMENT RIGHT KNEE WITH  POLY EXCHANGE;  Surgeon: [name], MD;  Location: [place];  Service: Orthopedics;  Laterality: Right;   JOINT REPLACEMENT Left    knee   LAPAROSCOPIC GASTRIC BANDING  01/05/2008   LAPAROSCOPIC GASTRIC BANDING  12/18/2010   01/04/2009   SALPHINGOOOPHORECTOMY Right    cyst removal   TONSILLECTOMY     TOTAL KNEE ARTHROPLASTY Left 07/31/2016   Procedure: LEFT TOTAL KNEE ARTHROPLASTY;  Surgeon: [name], MD;  Location: [place];  Service: Orthopedics;  Laterality: Left;   TOTAL KNEE ARTHROPLASTY Right 12/23/2020   Procedure: RIGHT TOTAL KNEE  ARTHROPLASTY;  Surgeon: [name], MD;  Location: [facility];  Service: Orthopedics;  Laterality: Right;   WEDGE RESECTION OF OVARY  LEFT OVARY    Patient Active Problem List   Diagnosis Date Noted   Tachypnea 01/03/2021   Cellulitis of right knee 01/03/2021   Chronic anticoagulation 01/03/2021   Status post total right knee replacement 12/23/2020   Unilateral primary osteoarthritis, right knee 04/07/2020   Leg edema 08/19/2018   Presence of left artificial knee joint 08/14/2016   Unilateral primary osteoarthritis, left knee 07/31/2016   Status post total knee replacement, left 07/31/2016   Abdominal hematoma    Post-operative state    Paroxysmal atrial fibrillation ([place])    Controlled type 2 diabetes mellitus with diabetic nephropathy ([place])    Wound infection 01/25/2016   Hydronephrosis, left 01/25/2016    Hydronephrosis, right 01/25/2016   DVT (deep venous thrombosis) ([place]) 01/25/2016   Atrial fibrillation ([place]) 01/25/2016   Diabetes mellitus with complication ([place])    Wound infection after surgery, initial encounter    Varicose veins of lower extremities with complications 54/62/7035   Pain and swelling of left lower leg 10/27/2013   Pain of left lower extremity 10/27/2013   Gastroenteritis, acute 09/08/2013   Fever, unspecified 09/04/2013   ASD (atrial septal defect) 08/13/2013   Pulmonary embolism ([place]) 06/22/2013   Dyspnea 06/11/2013   Anterior slip repaired Oct 2012 01/18/2011   GI bleed 12/18/2010   Diabetes mellitus ([place]) 12/18/2010   Varicose veins of lower extremities with other complications 00/93/8182   Diabetes mellitus    HIP PAIN, RIGHT 03/06/2010    REFERRING DIAG: T84.53XD (ICD-10-CM) - Infection of total right knee replacement, subsequent encounter Z96.651 (ICD-10-CM) - Status post total right knee replacement   THERAPY DIAG:  Muscle weakness (generalized)  Unsteadiness on feet  Difficulty in walking, not elsewhere classified  Other low back pain  Rationale for Evaluation and Treatment Rehabilitation  ONSET DATE: 09/08/2021    SUBJECTIVE:    SUBJECTIVE STATEMENT: Doing well, feels ready to d/c today.    PERTINENT HISTORY: follow-up as a relates to a MRSA infection in the right total knee arthroplasty.   PAIN:  NPRS scale: did not rate today/10 Pain location: Rt knee Pain description: tightness/stiffness Aggravating factors: sitting prolonged, walking in public Relieving factors: moving around some   PRECAUTIONS: None   WEIGHT BEARING RESTRICTIONS No   FALLS:  Has patient fallen in last 6 months? No   LIVING ENVIRONMENT: Lives with: lives with their spouse Lives in: House/apartment Stairs: 1 STE with U rail, no steps inside  Has following equipment at home: Single point cane, Walker - 2 wheeled, Crutches, bed side commode, and walking  poles   OCCUPATION: nurse at [employer]: Independent, Independent with basic ADLs, Independent with gait, and Independent with transfers   PATIENT GOALS improve function in general, be able to walk without crutches, decrease pain      OBJECTIVE:    PATIENT SURVEYS:  11/30/21: FOTO 61 11/28/2021: FOTO update: 56  11/07/21: FOTO 49 10/05/21  FOTO 34.8   MUSCLE LENGTH: 10/05/21  Hamstrings WNL, Piriformis moderate limitation L severe limitation R    POSTURE:  10/05/21 rounded shoulders, forward head, decreased lumbar lordosis, decreased thoracic kyphosis, and flexed trunk    PALPATION: 10/05/21  Lt lumbar paraspinals TTP, more tense than the R    LOWER EXTREMITY ROM:   Active ROM Right 10/05/21 Left 10/05/21 Right 10/24/21 Right 11/28/2021  Hip flexion  Hip extension        Hip abduction        Hip adduction        Hip internal rotation        Hip external rotation        Knee flexion 101 105 106 105  Knee extension '4 2 2 ' (hyper  LAQ)   Ankle dorsiflexion        Ankle plantarflexion        Ankle inversion        Ankle eversion         (Blank rows = not tested)   10/05/21 Lumbar ROM flexion WNL, extension severe limitation, lateral flexion moderate limitation    LOWER EXTREMITY MMT:   MMT Right 10/05/21 Left 10/05/21 Right 11/28/2021 Left 11/28/2021  Hip flexion 3- 3 5/5 5/5  Hip extension        Hip abduction 3- strong compensation with hip flexors  3- strong compensation with hip flexors 3-/5 (still compensating with hip flex) 3-/5 (still compensating with hip flex)  Hip adduction        Hip internal rotation        Hip external rotation        Knee flexion 4+ 4+ 5/5 5/5  Knee extension 4+ 4+ 4+/5 c mild pain 5/5  Ankle dorsiflexion 5 5    Ankle plantarflexion        Ankle inversion        Ankle eversion         (Blank rows = not tested)   FUNCTIONAL TESTS:  11/30/21: TUG: 9.00 sec without AD  11/07/21 5x STS: 11.19 sec without  UEs TUG: 12.19 sec without AD (carrying crutch but not using)  10/31/2021:  tandem stance independent 15-20 seconds on several tries prior to hands required to prevent loss of balance.  Similar performance bilaterally.   10/05/21 5 times sit to stand: 21 seconds no UEs  Timed up and go (TUG): 16 seconds B crutches    GAIT: 11/28/2021:  Arrived c axillary crutch Lt arm.  Able to perform independent ambulation within clinic c antalgic gait/variable stance phase/step lengths.   10/18/21 Pt amb in clinic with Lt crutch with no losses of balance or instability  10/05/21 Distance walked: in clinic distances  Assistive device utilized: Crutches Level of assistance: Modified independence Comments: reciprocal pattern with B crutches, flexed at hips, wide BOS        TODAY'S TREATMENT: 11/30/21 TherEx: NuStep L6 x 10 min; LEs only Sidelying hip circles in abduction x 10 reps each direction bil Standing hip hike on RLE 5x10 sec hold; mod cues for technique Leg press bil LE 100# with isometric hip abduction 3x10 See MMT assessment above TUG performed; see above  11/28/2021: Therex: Nustep Lvl 6 LE only 9 mins Leg press single leg 3 x 15 56 lbs Sidestepping blue band 10 ft x 4 each way in // bars Knee extension machine double leg up, single leg lowering slowly 2 x 10 10 lbs (performed bilateral)  Neuro Re-ed Alternating fwd, reverse ambulation in // bars c focus on larger steps and WB improvement 10 ft x 6 each way Retro step x 20 Rt leg posterior Fitter rocker board fwd/back light touch control x 30 each (occasional hand assist)   11/21/21 TherEx: NuStep L6 x 10 min; LEs only Knee extension machine RLE only 3x10; 5# Knee flexion machine RLE 3x10; 15# Leg Press single limb 50# 3x15 (performed bil) Sidestepping with  L4 band x 5 laps in // bars; intermittent UE support Monster walk x 5 laps in // bars Heel taps off 4" block with RLE on block 3x10 Side step up onto 4" block with  contralateral hip abduction 2x10 bil      PATIENT EDUCATION:  10/05/21 Education details: exam findings, POC, HEP  Person educated: Patient Education method: Explanation, Demonstration, and Handouts Education comprehension: verbalized understanding and returned demonstration     HOME EXERCISE PROGRAM: Access Code: [email or web address] URL: [email or web address] Date: 11/30/2021 Prepared by: Faustino Congress  Exercises - Seated Hip Abduction with Resistance  - 1-2 x daily - 7 x weekly - 1 sets - 15 reps - 1 hold - Seated March with Resistance  - 1-2 x daily - 7 x weekly - 1 sets - 10-15 reps - 1 hold - Seated Transversus Abdominis Bracing  - 3 x daily - 7 x weekly - 1 sets - 20 reps - 3 hold - Seated Straight Leg Heel Taps  - 1-2 x daily - 7 x weekly - 3 sets - 10 reps - Sit to Stand  - 3 x daily - 7 x weekly - 1 sets - 10 reps - Clamshell  - 1 x daily - 7 x weekly - 2-3 sets - 10 reps - Sidelying Hip Circles  - 1 x daily - 7 x weekly - 3 sets - 10 reps - Standing Hip Hiking  - 1 x daily - 7 x weekly - 3 sets - 10 reps   ASSESSMENT:   CLINICAL IMPRESSION: Pt has met all goals except strength goal.  At this time she has established HEP to address areas of weakness and is ready for d/c from PT.  Will d/c PT today.  OBJECTIVE IMPAIRMENTS Abnormal gait, decreased balance, decreased endurance, decreased knowledge of use of DME, decreased mobility, difficulty walking, decreased ROM, decreased strength, increased fascial restrictions, increased muscle spasms, impaired flexibility, postural dysfunction, obesity, and pain.    ACTIVITY LIMITATIONS carrying, lifting, standing, squatting, stairs, transfers, and locomotion level   PARTICIPATION LIMITATIONS: meal prep, cleaning, laundry, interpersonal relationship, driving, shopping, community activity, occupation, and yard work   PERSONAL FACTORS Age, Behavior pattern, Fitness, Past/current experiences, and Time since onset of  injury/illness/exacerbation are also affecting patient's functional outcome.    REHAB POTENTIAL: Good   CLINICAL DECISION MAKING: Evolving/moderate complexity   EVALUATION COMPLEXITY: Moderate     GOALS: Goals reviewed with patient? Yes   SHORT TERM GOALS: Target date: 11/09/2021  Will be compliant with appropriate progressive HEP  Baseline: Goal status: MET 11/07/21   2.  Will have better understanding of posture and ergonomics  Baseline:  Goal status:  MET 11/07/21   3.  Will be able to ambulate up to 326f with U crutch or SPC without unsteadiness or increased pain  Baseline:  Goal status:  MET 11/07/21   4.  Lumbar ROM to be minimally impaired on all planes of motion  Baseline:  Goal status:  Partially MET     LONG TERM GOALS: Target date: 12/14/2021    MMT to improve by at least 1 grade in all weak groups  Baseline:  Goal status: Partially MET 11/30/21   2.  Will be able to complete 5x sit to stand in 15 seconds or less without UEs  Baseline:  Goal status: MET 11/07/21   3.  Will be able to complete TUG in 12 seconds or less with LRAD  Baseline:  Goal status: MET 11/30/21   4.  Will be able to ambulate unlimited community distances with LRAD and back pain no more than 2/10 Baseline:  Goal status: MET 11/30/21   5.  Will be able to ambulate distances of 200-376f with no device and no increased pain or unsteadiness  Baseline:  Goal status: MET 11/30/21   6.  Will tolerate return to work for at least a 4 hour shift without increase in pain  Baseline:  Goal status: MET 11/30/21     PLAN: PT FREQUENCY: 2x/week   PT DURATION: 10 weeks   PLANNED INTERVENTIONS: Therapeutic exercises, Therapeutic activity, Neuromuscular re-education, Balance training, Gait training, Patient/Family education, Self Care, Joint mobilization, Stair training, DME instructions, Aquatic Therapy, Dry Needling, Electrical stimulation, Spinal mobilization, Cryotherapy, Moist heat, Taping,  Vasopneumatic device, Ultrasound, Ionotophoresis 478mml Dexamethasone, Manual therapy, and Re-evaluation   PLAN FOR NEXT SESSION: d/c PT today.     StLaureen AbrahamsPT, DPT 11/30/21 8:39 AM    PHYSICAL THERAPY DISCHARGE SUMMARY  Visits from Start of Care: 11  Current functional level related to goals / functional outcomes: See above   Remaining deficits: See above   Education / Equipment: HEP   Patient agrees to discharge. Patient goals were met. Patient is being discharged due to meeting the stated rehab goals.   StLaureen AbrahamsPT, DPT 11/30/21 8:39 AM   [facility] Hospital[facility] Therapy [email or web address]: [phone] Fax:  [phone]

## 2022-01-27 ENCOUNTER — Other Ambulatory Visit: Payer: Self-pay | Admitting: Cardiovascular Disease

## 2022-02-10 ENCOUNTER — Encounter: Payer: Self-pay | Admitting: Cardiovascular Disease

## 2022-02-10 NOTE — Progress Notes (Unsigned)
Date:  02/10/2022   ID:  Roger Shelter, DOB 29-Nov-1960, MRN [id number]  Patient Location: Home Provider Location: Home  PCP:  Mateo Flow, MD  Cardiologist:  Mertie Moores, MD  Electrophysiologist:  None   Problem List Problem list 1. Pulmonary embolus 2. Atrial fibrillation 3.   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  The patient has an interesting cardiopulmonary history.  She was in her usual state of health until late April 2015 when she began to have pulmonary congestion symptoms and thought that she had the flu.  She saw her PCP in [place] several times initially her oxygen saturation at the first visit was 96% and the second visit was 92%.  She continued to worsen and collapsed at home.  She was brought to [facility] where in the emergency room her d-dimer was 13 and she underwent a CT angiogram which showed massive pulmonary embolus with saddle embolus.  She had slight elevation of her troponins consistent with pulmonary embolus.  Her initial echocardiogram on 06/11/13 showed a moderate hypertension with a pulmonary artery pressure of 41.  The ventricular septum showed paradoxic motion consistent with right ventricular volume overload.  There was a probable medium sized ostium secundum ASD present.  The right atrium was markedly dilated.  The patient went on to be treated with transcatheter ultrasound assisted thrombolytic infusion therapy for her saddle embolus.  She had a good clinical response.  She had a followup echocardiogram on 06/22/13 which showed that her pulmonary artery pressure had returned to normal at 16.  The right atrial size was normal.  The right ventricular size was normal.  There was no evidence of ASD or PFO. The patient was evaluated for hypercoagulable state as the cause of her pulmonary embolus and her DVT of her right leg.  However the hypercoagulability workup was negative.  The patient does not have any family history of  abnormal blood clotting. The patient herself has no prior cardiac history.  She does have a past history of morbid obesity and has had lap band surgery by Dr. Johnathan Hausen.  The patient initially weighed about 388 pounds.  Since her surgery she has lost approximately 130 pounds. She still is experiencing mild exertional dyspnea.  She denies any chest discomfort.  She has not been aware of any racing of her heart.   May 06, 2015 Was recently found have atrial fibrillation. She's been having some increased shortness of breath and fatigue. She came to the [facility] emergency room and was found have atrial fibrillation at rate of 154. Was treated with Diltiazem and metoprolol and she feels much better.   Thinks that she has converted .   July 10, 2016:   Radie is here for pre- op clearance for left knee replacement  Has not any noticeable episodes of Afib .  On Xarelto .   July 22, 2017:   Had an episode of PAF after taking cold meds.  Resolved after fluid, rest, and an extra metoprolol  Has mild swelling in feet  Is not getting exercise, sits most of the day  Wt. Is 276.    Evaluation Performed:  Follow-Up Visit  Chief Complaint:  Follow up PAF and hx of pulmonary embolus  July 17, 2018   ALLEAH DEARMAN is a 62 y.o. female with a history of paroxysmal atrial fibrillation and a history of pulmonary embolus.  Several weeks ago woke up with AF,  Dyspnea,  Lasted for 45 min Took 2nd metoprolol at that time Developed leg swelling and shortness of breath Was started on lasix  Leg edema has not improved despite lasix  ( she showed me her legs on camara )  Left >> right leg swelling   The patient does not have symptoms concerning for COVID-19 infection (fever, chills, cough, or new shortness of breath).    August 19, 2018:  Ivorie is seen today for follow up visit.     Wt is 317 lbs  - up 40 lbs  Had lap band surgery 10 years ago .   We saw her in June - telemedicine visit  -   Had developed 2+ leg edema  We changed the lasix to Torsemide.  Echo for normal left ventricular systolic function.  Ejection fraction is 60 to 65%.  Normal diastolic function.  Mild thickening of the aortic valve.  Event monitor showed rare episoes of NSVT.     Jan. 28, 2021  Last visit was via telemedicine  Doing well from a cardiac standpont  Her event monitor revealed 5 beats of supraventricular tachycardia.  She seems to be much better on the Toprol. Has rare episodes of palpitations that only last for a second or 2.  Not associated with syncope or presyncope. No exercise,  Works at [employer].    Had an RA flare this past summer .   Arthritis is better on Plaquenil  Has developed diverticulitis    Sept. 23, 2022 Olimpia is seen for follow upf visit  - hx of DVT / saddle pulmonary embolism  Has had some palpitations - was found to have SVT- last for only a second or 2 Wt is 334 lbs  Has started diabetes medication  Hb A1C was 5.8  Going for R knee replacement soon She is at low risk for this surgery  OK to hold Xarelto - will defer to Pharm D for  timing / duration . She has used Lovenox bridging in the past if the decision is made to bridge her .   Jan. 9, 2023 Matilynn is seen today for follow up visit  Hx of DVT / saddle embolus Wt.  = 303 ( down 31 lbs)   Went in for knee replacement  to [place]  On day 3-4 post op Had severe dyspnea, presyncope with ambulation  Had gone into rapid afib .  Was eventually found to have an infected prosthetic knee   Was treated with metoprolol , higher dose.  Converted to NSR with the metoprolol   Had severe dyspnea a week or so following her Dc,    Chest CT angio at [facility] ruled out PE  Has a PICC line,  get Daptomycin ( had vancomycin resistant MRSA)    Jan. 10, 2024 Jadeyn is seen for follow up of her DVT / saddle embolus Had transient atrial fib.   Past Medical History:  Diagnosis Date   Anemia    Arthritis    Atrial  fibrillation ([place])    Bleeding ulcer 12-2010  hospitalized for 1 week   Blood transfusion    Blood transfusion without reported diagnosis    2011-2 units transfused, bleeding ulcer   Bruises easily    CHF (congestive heart failure) ([place])    with PE resolved after    Complication of anesthesia    Constipation    Difficulty in swallowing 03/24/2013   no longer a problem   Diverticulitis    DVT (deep venous  thrombosis) ([place]) 06/2013   right leg   Dysrhythmia     Hx of A fib    GERD (gastroesophageal reflux disease)    Hyperlipidemia    Leg swelling 03/24/2013   not a problem any longer   Osteoarthritis    Peripheral vascular disease ([place])    Pre-diabetes    Pulmonary embolus ([place]) 06/2013   Reflux    Shortness of breath    with PE   Vomiting 03/24/2013   problem resolved after lap band revision   Past Surgical History:  Procedure Laterality Date   APPENDECTOMY     [place]     CHOLECYSTECTOMY     5'12   COLONOSCOPY N/A 04/10/2013   Procedure: COLONOSCOPY;  Surgeon: Beryle Beams, MD;  Location: [facility];  Service: Endoscopy;  Laterality: N/A;   DENTAL SURGERY  10/2019   tooth implant   DIAGNOSTIC LAPAROSCOPY     ENDOVENOUS ABLATION SAPHENOUS VEIN W/ LASER  06/2008   ENDOVENOUS ABLATION SAPHENOUS VEIN W/ LASER Left 12/09/2013   EVLA LEFT GREATER SAPHENOUS VEIN BY TODD EARLY MD   ESOPHAGOGASTRODUODENOSCOPY  12/18/2010   Procedure: ESOPHAGOGASTRODUODENOSCOPY (EGD);  Surgeon: Gatha Mayer, MD;  Location: [facility];  Service: Endoscopy;  Laterality: N/A;   I & D KNEE WITH POLY EXCHANGE Right 01/06/2021   Procedure: IRRIGATION AND DEBRIDEMENT RIGHT KNEE WITH  POLY EXCHANGE;  Surgeon: [name], MD;  Location: [place];  Service: Orthopedics;  Laterality: Right;   JOINT REPLACEMENT Left    knee   LAPAROSCOPIC GASTRIC BANDING  01/05/2008   LAPAROSCOPIC GASTRIC BANDING  12/18/2010   01/04/2009   SALPHINGOOOPHORECTOMY Right    cyst removal    TONSILLECTOMY     TOTAL KNEE ARTHROPLASTY Left 07/31/2016   Procedure: LEFT TOTAL KNEE ARTHROPLASTY;  Surgeon: [name], MD;  Location: [place];  Service: Orthopedics;  Laterality: Left;   TOTAL KNEE ARTHROPLASTY Right 12/23/2020   Procedure: RIGHT TOTAL KNEE ARTHROPLASTY;  Surgeon: [name], MD;  Location: [facility];  Service: Orthopedics;  Laterality: Right;   WEDGE RESECTION OF OVARY  LEFT OVARY      No outpatient medications have been marked as taking for the 02/14/22 encounter (Office Visit) with Tarina Volk, Wonda Cheng, MD.     Allergies:   Contrast media [iodinated contrast media], Penicillin g, and Ancef [cefazolin sodium]   Social History   Tobacco Use   Smoking status: Never   Smokeless tobacco: Never  Vaping Use   Vaping Use: Never used  Substance Use Topics   Alcohol use: No    Alcohol/week: 0.0 standard drinks of alcohol   Drug use: No     Family Hx: The patient's family history includes Arthritis in her mother; Diabetes in her mother; Heart attack in her father; Heart disease in her father; Kidney disease in her father; Peripheral vascular disease in her father.  ROS:   Please see the history of present illness.     All other systems reviewed and are negative.   Prior CV studies:   The following studies were reviewed today:    Labs/Other Tests and Data Reviewed:    EKG:       Recent Labs: 03/17/2021: ALT 7; BUN 9; Creat 0.68; Hemoglobin 11.6; Platelets 343; Potassium 4.1; Sodium 137   Recent Lipid Panel No results found for: "CHOL", "TRIG", "HDL", "CHOLHDL", "LDLCALC", "LDLDIRECT"  Wt Readings from Last 3 Encounters:  03/15/21 (!) 303 lb (137.4 kg)  02/13/21 (!) 303  lb 3.2 oz (137.5 kg)  01/13/21 (!) 302 lb 4 oz (137.1 kg)     Objective:     Physical Exam: There were no vitals taken for this visit.  No BP recorded.  {Refresh Note OR Click here to enter BP  :1}***    GEN:  Well nourished, well developed in no acute  distress HEENT: Normal NECK: No JVD; No carotid bruits LYMPHATICS: No lymphadenopathy CARDIAC: RRR ***, no murmurs, rubs, gallops RESPIRATORY:  Clear to auscultation without rales, wheezing or rhonchi  ABDOMEN: Soft, non-tender, non-distended MUSCULOSKELETAL:  No edema; No deformity  SKIN: Warm and dry NEUROLOGIC:  Alert and oriented x 3     ASSESSMENT & PLAN:     1.  PAF:       2.  Hx of saddle pulmonary embolus:     3.  Palpitations:    4.  Pre op for R TKA:     Medication Adjustments/Labs and Tests Ordered: Current medicines are reviewed at length with the patient today.  Concerns regarding medicines are outlined above.   Tests Ordered: No orders of the defined types were placed in this encounter.    Medication Changes: No orders of the defined types were placed in this encounter.    Disposition:  Follow up in 1 year   Signed, Mertie Moores, MD  02/10/2022 4:22 PM    [place]

## 2022-02-14 ENCOUNTER — Ambulatory Visit
Payer: No Typology Code available for payment source | Attending: Cardiovascular Disease | Admitting: Cardiovascular Disease

## 2022-02-14 ENCOUNTER — Encounter: Payer: Self-pay | Admitting: Cardiovascular Disease

## 2022-02-14 VITALS — BP 128/78 | HR 60 | Ht 66.0 in | Wt 302.4 lb

## 2022-02-14 DIAGNOSIS — R002 Palpitations: Secondary | ICD-10-CM | POA: Diagnosis not present

## 2022-02-14 DIAGNOSIS — I10 Essential (primary) hypertension: Secondary | ICD-10-CM | POA: Diagnosis not present

## 2022-02-14 MED ORDER — METOPROLOL TARTRATE 25 MG PO TABS: ORAL_TABLET | ORAL | 1 refills | Status: DC

## 2022-02-14 MED ORDER — METOPROLOL SUCCINATE ER 50 MG PO TB24: ORAL_TABLET | ORAL | 3 refills | Status: DC

## 2022-02-14 NOTE — Patient Instructions (Signed)
Medication Instructions:  REFILLED Metoprolol Succinate '50mg'$  daily START Metoprolol Tartrate '25mg'$  up to twice daily as needed for palpitations *If you need a refill on your cardiac medications before your next appointment, please call your pharmacy*   Lab Work: NONE If you have labs (blood work) drawn today and your tests are completely normal, you will receive your results only by: [place] (if you have MyChart) OR A paper copy in the mail If you have any lab test that is abnormal or we need to change your treatment, we will call you to review the results.   Testing/Procedures: NONE   Follow-Up: At [facility], you and your health needs are our priority.  As part of our continuing mission to provide you with exceptional heart care, we have created designated Provider Care Teams.  These Care Teams include your primary Cardiologist (physician) and Advanced Practice Providers (APPs -  Physician Assistants and Nurse Practitioners) who all work together to provide you with the care you need, when you need it.  We recommend signing up for the patient portal called "MyChart".  Sign up information is provided on this After Visit Summary.  MyChart is used to connect with patients for Virtual Visits (Telemedicine).  Patients are able to view lab/test results, encounter notes, upcoming appointments, etc.  Non-urgent messages can be sent to your provider as well.   To learn more about what you can do with MyChart, go to [email or web address].    Your next appointment:   1 year(s)  The format for your next appointment:   In Person  Provider:   Mertie Moores, MD       Important Information About Sugar

## 2022-03-20 ENCOUNTER — Other Ambulatory Visit: Payer: Self-pay | Admitting: Cardiovascular Disease

## 2022-03-20 DIAGNOSIS — R002 Palpitations: Secondary | ICD-10-CM

## 2022-03-20 DIAGNOSIS — I10 Essential (primary) hypertension: Secondary | ICD-10-CM

## 2022-04-09 ENCOUNTER — Telehealth: Payer: Self-pay

## 2022-04-09 NOTE — Telephone Encounter (Signed)
   Pre-operative Risk Assessment    Patient Name: [name]  DOB: 12-19-1960 MRN: [id number]      Request for Surgical Clearance    Procedure:   CATARACT EXTRACTION BY PE,IOL - RIGHT  Date of Surgery:  Clearance 04/25/22                                 Surgeon:  DR. Vicente Males DELMONTE Surgeon's Group or Practice Name:  [facility] Phone number:  [phone] [place] Fax number:  [phone]   Type of Clearance Requested:   - Medical    Type of Anesthesia:  Not Indicated   Additional requests/questions:    SignedJacinta Shoe   04/09/2022, 5:22 PM

## 2022-04-10 NOTE — Telephone Encounter (Signed)
   Patient Name: [name]  DOB: Mar 24, 1960 MRN: [id number]  Primary Cardiologist: Mertie Moores, MD  Chart reviewed as part of pre-operative protocol coverage. Cataract extractions are recognized in guidelines as low risk surgeries that do not typically require specific preoperative testing or holding of blood thinner therapy. Therefore, given past medical history and time since last visit, based on ACC/AHA guidelines, [name] would be at acceptable risk for the planned procedure without further cardiovascular testing.   I will route this recommendation to the requesting party via Epic fax function and remove from pre-op pool.  Please call with questions.  Deberah Pelton, NP 04/10/2022, 7:03 AM

## 2022-05-15 ENCOUNTER — Other Ambulatory Visit: Payer: Self-pay | Admitting: Orthopedic Surgery

## 2022-05-25 ENCOUNTER — Other Ambulatory Visit: Payer: Self-pay | Admitting: Cardiovascular Disease

## 2022-05-25 DIAGNOSIS — I10 Essential (primary) hypertension: Secondary | ICD-10-CM

## 2022-05-25 DIAGNOSIS — R002 Palpitations: Secondary | ICD-10-CM

## 2022-05-28 NOTE — Telephone Encounter (Signed)
Rx request sent to pharmacy.  

## 2022-11-14 ENCOUNTER — Other Ambulatory Visit: Payer: Self-pay | Admitting: Cardiovascular Disease

## 2022-11-14 DIAGNOSIS — I10 Essential (primary) hypertension: Secondary | ICD-10-CM

## 2022-11-14 DIAGNOSIS — R002 Palpitations: Secondary | ICD-10-CM

## 2022-12-28 ENCOUNTER — Other Ambulatory Visit: Payer: Self-pay | Admitting: Orthopedic Surgery

## 2023-02-11 NOTE — Progress Notes (Signed)
 Date:  02/13/2023   ID:  [name], DOB 02-15-1960, MRN [id number]  Patient Location: Home Provider Location: Home  PCP:  Fernand Tracey LABOR, MD  Cardiologist:  Aleene Passe, MD  Electrophysiologist:  None   Problem List Problem list 1. Pulmonary embolus 2. Atrial fibrillation 3.   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  The patient has an interesting cardiopulmonary history.  She was in her usual state of health until late April 2015 when she began to have pulmonary congestion symptoms and thought that she had the flu.  She saw her PCP in [place] several times initially her oxygen saturation at the first visit was 96% and the second visit was 92%.  She continued to worsen and collapsed at home.  She was brought to [facility] where in the emergency room her d-dimer was 13 and she underwent a CT angiogram which showed massive pulmonary embolus with saddle embolus.  She had slight elevation of her troponins consistent with pulmonary embolus.  Her initial echocardiogram on 06/11/13 showed a moderate hypertension with a pulmonary artery pressure of 41.  The ventricular septum showed paradoxic motion consistent with right ventricular volume overload.  There was a probable medium sized ostium secundum ASD present.  The right atrium was markedly dilated.  The patient went on to be treated with transcatheter ultrasound assisted thrombolytic infusion therapy for her saddle embolus.  She had a good clinical response.  She had a followup echocardiogram on 06/22/13 which showed that her pulmonary artery pressure had returned to normal at 16.  The right atrial size was normal.  The right ventricular size was normal.  There was no evidence of ASD or PFO. The patient was evaluated for hypercoagulable state as the cause of her pulmonary embolus and her DVT of her right leg.  However the hypercoagulability workup was negative.  The patient does not have any family history of  abnormal blood clotting. The patient herself has no prior cardiac history.  She does have a past history of morbid obesity and has had lap band surgery by Dr. Donnice Lunger.  The patient initially weighed about 388 pounds.  Since her surgery she has lost approximately 130 pounds. She still is experiencing mild exertional dyspnea.  She denies any chest discomfort.  She has not been aware of any racing of her heart.   May 06, 2015 Was recently found have atrial fibrillation. She's been having some increased shortness of breath and fatigue. She [facility] and was found have atrial fibrillation at rate of 154. Was treated with Diltiazem  and metoprolol  and she feels much better.   Thinks that she has converted .   July 10, 2016:   Dreya is here for pre- op clearance for left knee replacement  Has not any noticeable episodes of Afib .  On Xarelto  .   July 22, 2017:   Had an episode of PAF after taking cold meds.  Resolved after fluid, rest, and an extra metoprolol   Has mild swelling in feet  Is not getting exercise, sits most of the day  Wt. Is 276.    Evaluation Performed:  Follow-Up Visit  Chief Complaint:  Follow up PAF and hx of pulmonary embolus  July 17, 2018   [name] is a 63 y.o. female with a history of paroxysmal atrial fibrillation and a history of pulmonary embolus.  Several weeks ago woke up with AF,  Dyspnea,  Lasted for 45 min Took 2nd metoprolol  at that time Developed leg swelling and shortness of breath Was started on lasix   Leg edema has not improved despite lasix   ( she showed me her legs on camara )  Left >> right leg swelling   The patient does not have symptoms concerning for COVID-19 infection (fever, chills, cough, or new shortness of breath).    August 19, 2018:  [name] is seen today for follow up visit.     Wt is 317 lbs  - up 40 lbs  Had lap band surgery 10 years ago .   We saw her in June - telemedicine visit  -   Had developed 2+ leg edema  We changed the lasix  to Torsemide .  Echo for normal left ventricular systolic function.  Ejection fraction is 60 to 65%.  Normal diastolic function.  Mild thickening of the aortic valve.  Event monitor showed rare episoes of NSVT.     Jan. 28, 2021  Last visit was via telemedicine  Doing well from a cardiac standpont  Her event monitor revealed 5 beats of supraventricular tachycardia.  She seems to be much better on the Toprol . Has rare episodes of palpitations that only last for a second or 2.  Not associated with syncope or presyncope. No exercise,  Works at [employer].    Had an RA flare this past summer .   Arthritis is better on Plaquenil   Has developed diverticulitis    Sept. 23, 2022 Livi is seen for follow upf visit  - hx of DVT / saddle pulmonary embolism  Has had some palpitations - was found to have SVT- last for only a second or 2 Wt is 334 lbs  Has started diabetes medication  Hb A1C was 5.8  Going for R knee replacement soon She is at low risk for this surgery  OK to hold Xarelto  - will defer to Pharm D for  timing / duration . She has used Lovenox  bridging in the past if the decision is made to bridge her .   Jan. 9, 2023 [name] is seen today for follow up visit  Hx of DVT / saddle embolus Wt.  = 303 ( down 31 lbs)   Went in for knee replacement  to [place]  On day 3-4 post op Had severe dyspnea, presyncope with ambulation  Had gone into rapid afib .  Was eventually found to have an infected prosthetic knee   Was treated with metoprolol  , higher dose.  Converted to NSR with the metoprolol    Had severe dyspnea a week or so following her Dc,    Chest CT angio at [facility] ruled out PE  Has a PICC line,  get Daptomycin  ( had vancomycin  resistant MRSA)    Jan. 10, 2024 [name] is seen for follow up of her DVT / saddle embolus Had transient atrial fib.  Has had partial retinal detachment in her R eye. Has it patched .  Is  doing well from a cardiac standpoint  Has had a few palpitations .   Takes extra metoprolol  with relief.  Has occurred once every 3 month   Jan. 8, 2025  [name] is seen for follow up of hr DVT,  saddle pulmonary embolus, PAF  Wt is 330 lbs  Is on lifelong Xarelto   Has been having more palpitations . May last for 3-4 hours . These feel like her episodes of atrial fib  She has had a sleep study and does  not have OSA   She currently takes Toprol -XL 50 mg a day.  She uses metoprolol  tartrate 25 mg to take on an as-needed basis.  We discussed the possibility of adding propranolol  20 mg up to 4 times a day as needed for breakthrough atrial fibrillation.  Will give her a prescription for propranolol  which she can use in addition to the metoprolol  dosing above to see if she can clear up her palpitations quicker.    Past Medical History:  Diagnosis Date   Anemia    Arthritis    Atrial fibrillation (HCC)    Bleeding ulcer 12-2010  hospitalized for 1 week   Blood transfusion    Blood transfusion without reported diagnosis    2011-2 units transfused, bleeding ulcer   Bruises easily    CHF (congestive heart failure) (HCC)    with PE resolved after    Complication of anesthesia    Constipation    Difficulty in swallowing 03/24/2013   no longer a problem   Diverticulitis    DVT (deep venous thrombosis) (HCC) 06/2013   right leg   Dysrhythmia     Hx of A fib    GERD (gastroesophageal reflux disease)    Hyperlipidemia    Leg swelling 03/24/2013   not a problem any longer   Osteoarthritis    Peripheral vascular disease (HCC)    Pre-diabetes    Pulmonary embolus (HCC) 06/2013   Reflux    Shortness of breath    with PE   Vomiting 03/24/2013   problem resolved after lap band revision   Past Surgical History:  Procedure Laterality Date   APPENDECTOMY     CARPEL TUNNEL BILATEREAL     CHOLECYSTECTOMY     5'12   COLONOSCOPY N/A 04/10/2013   Procedure: COLONOSCOPY;  Surgeon:  Belvie JONETTA Just, MD;  Location: [facility];  Service: Endoscopy;  Laterality: N/A;   DENTAL SURGERY  10/2019   tooth implant   DIAGNOSTIC LAPAROSCOPY     ENDOVENOUS ABLATION SAPHENOUS VEIN W/ LASER  06/2008   ENDOVENOUS ABLATION SAPHENOUS VEIN W/ LASER Left 12/09/2013   EVLA LEFT GREATER SAPHENOUS VEIN BY TODD EARLY MD   ESOPHAGOGASTRODUODENOSCOPY  12/18/2010   Procedure: ESOPHAGOGASTRODUODENOSCOPY (EGD);  Surgeon: Lupita FORBES Commander, MD;  Location: [facility];  Service: Endoscopy;  Laterality: N/A;   I & D KNEE WITH POLY EXCHANGE Right 01/06/2021   Procedure: IRRIGATION AND DEBRIDEMENT RIGHT KNEE WITH  POLY EXCHANGE;  Surgeon: Vernetta Lonni GRADE, MD;  Location: [facility];  Service: Orthopedics;  Laterality: Right;   JOINT REPLACEMENT Left    knee   LAPAROSCOPIC GASTRIC BANDING  01/05/2008   LAPAROSCOPIC GASTRIC BANDING  12/18/2010   01/04/2009   SALPHINGOOOPHORECTOMY Right    cyst removal   TONSILLECTOMY     TOTAL KNEE ARTHROPLASTY Left 07/31/2016   Procedure: LEFT TOTAL KNEE ARTHROPLASTY;  Surgeon: Vernetta Lonni GRADE, MD;  Location: [facility] OR;  Service: Orthopedics;  Laterality: Left;   TOTAL KNEE ARTHROPLASTY Right 12/23/2020   Procedure: RIGHT TOTAL KNEE ARTHROPLASTY;  Surgeon: Vernetta Lonni GRADE, MD;  Location: [facility];  Service: Orthopedics;  Laterality: Right;   WEDGE RESECTION OF OVARY  LEFT OVARY      Current Meds  Medication Sig   acetaminophen  (TYLENOL ) 325 MG tablet Take 2 tablets (650 mg total) by mouth every 6 (six) hours as needed for mild pain (or Fever >/= 101).   celecoxib  (CELEBREX ) 200 MG capsule TAKE 1 CAPSULE BY MOUTH EVERY DAY  metoprolol  succinate (TOPROL -XL) 50 MG 24 hr tablet TAKE 1 TABLET BY MOUTH EVERY DAY WITH OR IMMEDIATELY FOLLOWING A MEAL   metoprolol  tartrate (LOPRESSOR ) 25 MG tablet TAKE 1 TABLET BY MOUTH UP TO TWICE DAILY AS NEEDED FOR PALPITATIONS (IN ADDITION TO SUCCINATE)   Multiple Vitamin (MULTIVITAMIN) tablet Take 1 tablet by mouth daily.    ondansetron  (ZOFRAN -ODT) 4 MG disintegrating tablet TAKE 1 TABLET BY MOUTH EVERY 8 HOURS AS NEEDED FOR NAUSEA AND VOMITING   pantoprazole  (PROTONIX ) 40 MG tablet Take 40 mg by mouth every morning.   polyethylene glycol (MIRALAX  / GLYCOLAX ) 17 g packet Take 17 g by mouth 2 (two) times daily.   sulfamethoxazole -trimethoprim  (BACTRIM  DS) 800-160 MG tablet Take 1 tablet by mouth 2 (two) times daily.   tretinoin (RETIN-A) 0.05 % cream Apply topically daily.   XARELTO  20 MG TABS tablet Take 20 mg by mouth daily.     Allergies:   Contrast media [iodinated contrast media], Penicillin g, and Ancef  [cefazolin  sodium]   Social History   Tobacco Use   Smoking status: Never   Smokeless tobacco: Never  Vaping Use   Vaping status: Never Used  Substance Use Topics   Alcohol use: No    Alcohol/week: 0.0 standard drinks of alcohol   Drug use: No     Family Hx: The patient's family history includes Arthritis in her mother; Diabetes in her mother; Heart attack in her father; Heart disease in her father; Kidney disease in her father; Peripheral vascular disease in her father.  ROS:   Please see the history of present illness.     All other systems reviewed and are negative.   Prior CV studies:   The following studies were reviewed today:    Labs/Other Tests and Data Reviewed:    EKG:  EKG Interpretation Date/Time:  Wednesday February 13 2023 09:42:58 EST Ventricular Rate:  63 PR Interval:  166 QRS Duration:  118 QT Interval:  456 QTC Calculation: 466 R Axis:   16  Text Interpretation: Normal sinus rhythm Incomplete right bundle branch block Cannot rule out Anterior infarct (cited on or before 03-Jan-2021) When compared with ECG of 03-Jan-2021 09:35, Questionable change in initial forces of Anteroseptal leads Confirmed by Alveta Mungo ([id number]) on 02/13/2023 10:07:28 AM       Recent Labs: No results found for requested labs within last 365 days.   Recent Lipid Panel No results  found for: CHOL, TRIG, HDL, CHOLHDL, LDLCALC, LDLDIRECT  Wt Readings from Last 3 Encounters:  02/13/23 (!) 330 lb 3.2 oz (149.8 kg)  02/14/22 (!) 302 lb 6.4 oz (137.2 kg)  03/15/21 (!) 303 lb (137.4 kg)     Objective:     Physical Exam: Blood pressure 114/76, pulse 71, height 5' 6 (1.676 m), weight (!) 330 lb 3.2 oz (149.8 kg), SpO2 99%.       GEN:  Well nourished, well developed in no acute distress HEENT: Normal NECK: No JVD; No carotid bruits LYMPHATICS: No lymphadenopathy CARDIAC: RRR , no murmurs, rubs, gallops RESPIRATORY:  Clear to auscultation without rales, wheezing or rhonchi  ABDOMEN: Soft, non-tender, non-distended MUSCULOSKELETAL:  No edema; No deformity  SKIN: Warm and dry NEUROLOGIC:  Alert and oriented x 3      ASSESSMENT & PLAN:     1.  PAF:     maintaining NSR today.  She has been having more episodes of palpitations which are consistent with her previous episodes of paroxysmal atrial fibrillation.  She currently takes Toprol -XL  50 mg a day and then takes metoprolol  tartrate 25 mg a day as needed for breakthrough atrial fibrillation.  Will try her on some propranolol  20 mg to take as needed.  Will refer to the A-fib clinic for further management.  2.  Hx of saddle pulmonary embolus: Continue Xarelto . She had an unprovoked DVT / PE . I recommend lifelong anticoagulation       Medication Adjustments/Labs and Tests Ordered: Current medicines are reviewed at length with the patient today.  Concerns regarding medicines are outlined above.   Tests Ordered: Orders Placed This Encounter  Procedures   EKG 12-Lead     Medication Changes: No orders of the defined types were placed in this encounter.    Disposition:  Follow up in 1 year with Dr. Okey.  Signed, Aleene Passe, MD  02/13/2023 10:06 AM    [facility]

## 2023-02-13 ENCOUNTER — Encounter: Payer: Self-pay | Admitting: Cardiovascular Disease

## 2023-02-13 ENCOUNTER — Ambulatory Visit
Payer: No Typology Code available for payment source | Attending: Cardiovascular Disease | Admitting: Cardiovascular Disease

## 2023-02-13 VITALS — BP 114/76 | HR 71 | Ht 66.0 in | Wt 330.2 lb

## 2023-02-13 DIAGNOSIS — R002 Palpitations: Secondary | ICD-10-CM

## 2023-02-13 DIAGNOSIS — I10 Essential (primary) hypertension: Secondary | ICD-10-CM | POA: Diagnosis not present

## 2023-02-13 DIAGNOSIS — I480 Paroxysmal atrial fibrillation: Secondary | ICD-10-CM

## 2023-02-13 MED ORDER — PROPRANOLOL HCL 20 MG PO TABS: 20.0000 mg | ORAL_TABLET | Freq: Four times a day (QID) | ORAL | 2 refills | Status: DC | PRN

## 2023-02-13 NOTE — Patient Instructions (Signed)
 Medication Instructions:  Your physician has recommended you make the following change in your medication:  1) START propranolol  20 mg four times daily as needed for palpitations/heart rate greater than 110.  *If you need a refill on your cardiac medications before your next appointment, please call your pharmacy*  Lab Work: None ordered today.  Testing/Procedures: None ordered today.  Follow-Up: At [facility], you and your health needs are our priority.  As part of our continuing mission to provide you with exceptional heart care, we have created designated Provider Care Teams.  These Care Teams include your primary Cardiologist (physician) and Advanced Practice Providers (APPs -  Physician Assistants and Nurse Practitioners) who all work together to provide you with the care you need, when you need it.  Your next appointment:   Your physician wants you to follow-up in: 1 year You will receive a reminder letter in the mail two months in advance. If you don't receive a letter, please call our office to schedule the follow-up appointment.  The format for your next appointment:   In Person  Provider:   Vina Gull, MD {  Other Instructions You have been referred to the Atrial Fibrillation Clinic, their office will call you to schedule an appointment.

## 2023-02-17 ENCOUNTER — Other Ambulatory Visit: Payer: Self-pay | Admitting: Cardiovascular Disease

## 2023-02-17 DIAGNOSIS — I10 Essential (primary) hypertension: Secondary | ICD-10-CM

## 2023-02-17 DIAGNOSIS — R002 Palpitations: Secondary | ICD-10-CM

## 2023-03-11 ENCOUNTER — Ambulatory Visit (HOSPITAL_COMMUNITY)
Admission: RE | Admit: 2023-03-11 | Discharge: 2023-03-11 | Disposition: A | Discharge status: Home / Self Care | Payer: No Typology Code available for payment source | Source: Ambulatory Visit | Attending: Physician Assistant | Admitting: Physician Assistant

## 2023-03-11 ENCOUNTER — Encounter (HOSPITAL_COMMUNITY): Payer: Self-pay | Admitting: Physician Assistant

## 2023-03-11 VITALS — BP 128/82 | HR 61 | Ht 66.0 in | Wt 326.0 lb

## 2023-03-11 DIAGNOSIS — Z86711 Personal history of pulmonary embolism: Secondary | ICD-10-CM | POA: Insufficient documentation

## 2023-03-11 DIAGNOSIS — E785 Hyperlipidemia, unspecified: Secondary | ICD-10-CM | POA: Diagnosis not present

## 2023-03-11 DIAGNOSIS — I480 Paroxysmal atrial fibrillation: Secondary | ICD-10-CM | POA: Insufficient documentation

## 2023-03-11 DIAGNOSIS — Z7901 Long term (current) use of anticoagulants: Secondary | ICD-10-CM | POA: Insufficient documentation

## 2023-03-11 DIAGNOSIS — R002 Palpitations: Secondary | ICD-10-CM | POA: Insufficient documentation

## 2023-03-11 DIAGNOSIS — Z86718 Personal history of other venous thrombosis and embolism: Secondary | ICD-10-CM | POA: Diagnosis not present

## 2023-03-11 MED ORDER — FLECAINIDE ACETATE 50 MG PO TABS: 50.0000 mg | ORAL_TABLET | Freq: Two times a day (BID) | ORAL | 2 refills | Status: DC

## 2023-03-11 NOTE — Progress Notes (Signed)
Primary Care Physician: Lise Auer, MD Primary Cardiologist: Kristeen Miss, MD Electrophysiologist: None  Referring Physician: Dr Renae Fickle is a 63 y.o. female with a history of DVT/PE, HLD, atrial fibrillation who presents for follow up in the [facility].  The patient was initially diagnosed with atrial fibrillation in 2017. Patient is on Xarelto for stroke prevention and DVT/PE. She was seen by Dr Elease Hashimoto 02/13/23 and was having more frequent palpitations. She was started on PRN propranolol.   Patient presents today for follow up for atrial fibrillation. She reports that she continues to have symptoms of afib about 3 times per month. The episodes usually last > 6 hours. She did use PRN propranolol recently and the episode resolved after 30 minutes. No bleeding issues on anticoagulation.   Today, she denies symptoms of chest pain, shortness of breath, orthopnea, PND, lower extremity edema, dizziness, presyncope, syncope, snoring, daytime somnolence, bleeding, or neurologic sequela. The patient is tolerating medications without difficulties and is otherwise without complaint today.    Atrial Fibrillation Risk Factors:  she does not have symptoms or diagnosis of sleep apnea. Negative sleep study. she does not have a history of rheumatic fever. she does not have a history of alcohol use. The patient does not have a history of early familial atrial fibrillation or other arrhythmias.  Atrial Fibrillation Management history:  Previous antiarrhythmic drugs: none Previous cardioversions: none Previous ablations: none Anticoagulation history: Xarelto   ROS- All systems are reviewed and negative except as per the HPI above.  Past Medical History:  Diagnosis Date   Anemia    Arthritis    Atrial fibrillation (HCC)    Bleeding ulcer 12-2010  hospitalized for 1 week   Blood transfusion    Blood transfusion without reported diagnosis    2011-2  units transfused, bleeding ulcer   Bruises easily    CHF (congestive heart failure) (HCC)    with PE resolved after    Complication of anesthesia    Constipation    Difficulty in swallowing 03/24/2013   no longer a problem   Diverticulitis    DVT (deep venous thrombosis) (HCC) 06/2013   right leg   Dysrhythmia     Hx of A fib    GERD (gastroesophageal reflux disease)    Hyperlipidemia    Leg swelling 03/24/2013   not a problem any longer   Osteoarthritis    Peripheral vascular disease (HCC)    Pre-diabetes    Pulmonary embolus (HCC) 06/2013   Reflux    Shortness of breath    with PE   Vomiting 03/24/2013   problem resolved after lap band revision    Current Outpatient Medications  Medication Sig Dispense Refill   acetaminophen (TYLENOL) 325 MG tablet Take 2 tablets (650 mg total) by mouth every 6 (six) hours as needed for mild pain (or Fever >/= 101).     celecoxib (CELEBREX) 200 MG capsule TAKE 1 CAPSULE BY MOUTH EVERY DAY 30 capsule 7   flecainide (TAMBOCOR) 50 MG tablet Take 1 tablet (50 mg total) by mouth 2 (two) times daily. 60 tablet 2   metoprolol succinate (TOPROL-XL) 50 MG 24 hr tablet TAKE 1 TABLET BY MOUTH EVERY DAY WITH OR IMMEDIATELY FOLLOWING A MEAL 90 tablet 3   metoprolol tartrate (LOPRESSOR) 25 MG tablet TAKE 1 TABLET BY MOUTH UP TO TWICE DAILY AS NEEDED FOR PALPITATIONS (IN ADDITION TO SUCCINATE) 180 tablet 0   MOUNJARO 2.5 MG/0.5ML  Pen Inject 2.5 mg into the skin once a week.     Multiple Vitamin (MULTIVITAMIN) tablet Take 1 tablet by mouth daily.     ondansetron (ZOFRAN-ODT) 4 MG disintegrating tablet TAKE 1 TABLET BY MOUTH EVERY 8 HOURS AS NEEDED FOR NAUSEA AND VOMITING 20 tablet 3   pantoprazole (PROTONIX) 40 MG tablet Take 40 mg by mouth every morning.     polyethylene glycol (MIRALAX / GLYCOLAX) 17 g packet Take 17 g by mouth 2 (two) times daily. 14 each 0   propranolol (INDERAL) 20 MG tablet Take 1 tablet (20 mg total) by mouth 4 (four) times daily as  needed (for palpitations or heart rate greater than 110). 120 tablet 2   sulfamethoxazole-trimethoprim (BACTRIM DS) 800-160 MG tablet Take 1 tablet by mouth 2 (two) times daily. 60 tablet 5   tretinoin (RETIN-A) 0.05 % cream Apply topically daily.     XARELTO 20 MG TABS tablet Take 20 mg by mouth daily.     No current facility-administered medications for this encounter.    Physical Exam: BP 128/82   Pulse 61   Ht 5\' 6"  (1.676 m)   Wt (!) 147.9 kg   BMI 52.62 kg/m   GEN: Well nourished, well developed in no acute distress CARDIAC: Regular rate and rhythm, no murmurs, rubs, gallops RESPIRATORY:  Clear to auscultation without rales, wheezing or rhonchi  ABDOMEN: Soft, non-tender, non-distended EXTREMITIES:  No edema; No deformity   Wt Readings from Last 3 Encounters:  03/11/23 (!) 147.9 kg  02/13/23 (!) 149.8 kg  02/14/22 (!) 137.2 kg     EKG today demonstrates  SR Vent. rate 61 BPM PR interval 174 ms QRS duration 122 ms QT/QTcB 462/465 ms   Echo 01/04/21 demonstrated   1. Left ventricular ejection fraction, by estimation, is 60 to 65%. The  left ventricle has normal function. The left ventricle has no regional  wall motion abnormalities. Left ventricular diastolic parameters were  normal.   2. Right ventricular systolic function is normal. The right ventricular  size is normal. There is normal pulmonary artery systolic pressure.   3. Left atrial size was mildly dilated.   4. The mitral valve is normal in structure. No evidence of mitral valve  regurgitation. No evidence of mitral stenosis.   5. The aortic valve is tricuspid. Aortic valve regurgitation is not  visualized. Aortic valve sclerosis is present, with no evidence of aortic  valve stenosis.   6. The inferior vena cava is normal in size with greater than 50%  respiratory variability, suggesting right atrial pressure of 3 mmHg.   Comparison(s): A prior study was performed on 07/21/2018.    CHA2DS2-VASc Score  = 1  The patient's score is based upon: CHF History: 0 HTN History: 0 Diabetes History: 0 Stroke History: 0 Vascular Disease History: 0 Age Score: 0 Gender Score: 1       ASSESSMENT AND PLAN: Paroxysmal Atrial Fibrillation (ICD10:  I48.0) The patient's CHA2DS2-VASc score is 1, indicating a 0.6% annual risk of stroke.   Patient having more frequent episodes of tachypalpitations. We discussed rhythm control options today.  Will start flecainide 50 mg BID Return for ECG in the AF clinic next week. Continue Toprol 50 mg daily Continue propranolol 20 mg PRN q 6 hours for heart racing. Continue Xarelto 20 mg daily (on lifelong with unprovoked PE)    Follow up next week in the AF clinic for ECG.        Ricky Loris Seelye PA-C Afib  Clinic [facility] [address] [place], [place] [address] [phone]

## 2023-03-12 ENCOUNTER — Other Ambulatory Visit (HOSPITAL_COMMUNITY): Payer: Self-pay | Admitting: Physician Assistant

## 2023-03-19 ENCOUNTER — Other Ambulatory Visit: Payer: Self-pay | Admitting: Cardiovascular Disease

## 2023-03-19 ENCOUNTER — Ambulatory Visit (HOSPITAL_COMMUNITY)
Admission: RE | Admit: 2023-03-19 | Discharge: 2023-03-19 | Disposition: A | Discharge status: Home / Self Care | Payer: No Typology Code available for payment source | Source: Ambulatory Visit | Attending: Physician Assistant | Admitting: Physician Assistant

## 2023-03-19 DIAGNOSIS — Z79899 Other long term (current) drug therapy: Secondary | ICD-10-CM | POA: Diagnosis not present

## 2023-03-19 DIAGNOSIS — Z5181 Encounter for therapeutic drug level monitoring: Secondary | ICD-10-CM | POA: Diagnosis not present

## 2023-03-19 DIAGNOSIS — I480 Paroxysmal atrial fibrillation: Secondary | ICD-10-CM | POA: Insufficient documentation

## 2023-03-19 DIAGNOSIS — R002 Palpitations: Secondary | ICD-10-CM

## 2023-03-19 DIAGNOSIS — I10 Essential (primary) hypertension: Secondary | ICD-10-CM

## 2023-03-19 NOTE — Progress Notes (Signed)
Patient returns for ECG after starting flecainide. ECG shows:  SR, IVCD Vent. rate 63 BPM PR interval 186 ms QRS duration 126 ms QT/QTcB 448/458 ms  Patient appears to be maintaining SR. Follow up in the AF clinic in 3 months.

## 2023-04-02 ENCOUNTER — Other Ambulatory Visit: Payer: Self-pay | Admitting: Obstetrics and Gynecology

## 2023-04-02 DIAGNOSIS — N6452 Nipple discharge: Secondary | ICD-10-CM

## 2023-04-03 ENCOUNTER — Encounter: Payer: Self-pay | Admitting: Cardiovascular Disease

## 2023-04-03 ENCOUNTER — Telehealth: Payer: Self-pay | Admitting: Cardiovascular Disease

## 2023-04-03 NOTE — Telephone Encounter (Signed)
   Pre-operative Risk Assessment    Patient Name: [name]  DOB: 1960-04-21 MRN: [id number]   Date of last office visit: 02/13/2023 Date of next office visit: 06/19/2023   Request for Surgical Clearance    Procedure:   colonoscopy  Date of Surgery:  Clearance 05/31/23                                Surgeon:  Dr. Iva Lento Group or Practice Name:  [facility] Phone number:  [phone] Fax number:  [phone]   Type of Clearance Requested:   - Medical  - Pharmacy:  Hold Rivaroxaban (Xarelto) instruction   Type of Anesthesia:   Propofol   Additional requests/questions:    [name]   04/03/2023, 9:39 AM

## 2023-04-03 NOTE — Telephone Encounter (Signed)
 Patient with diagnosis of A Fib/DVT/PE on Xarelto for anticoagulation.    Procedure: colonoscopy Date of procedure: 05/31/23   CHA2DS2-VASc Score = 2  This indicates a 2.2% annual risk of stroke. The patient's score is based upon: CHF History: 0 HTN History: 0 Diabetes History: 1 Stroke History: 0 Vascular Disease History: 0 Age Score: 0 Gender Score: 1    CrCl 172 ml/min using adj body weight Platelet count 157K   Per office protocol, patient can hold Xarelto for 2 days prior to procedure.    **This guidance is not considered finalized until pre-operative APP has relayed final recommendations.**

## 2023-04-04 ENCOUNTER — Other Ambulatory Visit: Payer: Self-pay | Admitting: Gastroenterology

## 2023-04-04 NOTE — Telephone Encounter (Signed)
   Patient Name: [name]  DOB: 28-May-1960 MRN: [id number]  Primary Cardiologist: Kristeen Miss, MD  Chart reviewed as part of pre-operative protocol coverage. Given past medical history and time since last visit, based on ACC/AHA guidelines, [name] is at acceptable risk for the planned procedure without further cardiovascular testing. Pt was seen in clinic by Dr. Elease Hashimoto on 02/13/2023 and was doing well. Per Dr. Elease Hashimoto, "[name] is at low risk for her colonoscopy.  PN"   Per office protocol, patient can hold Xarelto for 2 days prior to procedure. Please resume Xarelto as soon as possible postprocedure, at the discretion of the surgeon.   I will route this recommendation to the requesting party via Epic fax function and remove from pre-op pool.  Please call with questions.  Joylene Grapes, NP 04/04/2023, 9:43 AM

## 2023-04-16 ENCOUNTER — Other Ambulatory Visit: Payer: Self-pay | Admitting: Obstetrics and Gynecology

## 2023-04-16 ENCOUNTER — Ambulatory Visit
Admission: RE | Admit: 2023-04-16 | Discharge: 2023-04-16 | Disposition: A | Discharge status: Home / Self Care | Payer: No Typology Code available for payment source | Source: Ambulatory Visit | Attending: Obstetrics and Gynecology | Admitting: Obstetrics and Gynecology

## 2023-04-16 DIAGNOSIS — N6452 Nipple discharge: Secondary | ICD-10-CM

## 2023-04-16 DIAGNOSIS — N6310 Unspecified lump in the right breast, unspecified quadrant: Secondary | ICD-10-CM

## 2023-04-17 ENCOUNTER — Telehealth (HOSPITAL_COMMUNITY): Payer: Self-pay | Admitting: *Deleted

## 2023-04-17 NOTE — Telephone Encounter (Signed)
 Pt left message stating flecainide induced symptomatic afib for her after taking about 2 weeks of taking the medication. She would go into afib about 2 hours after each dose for several hours. Pt states she has stopped the medication.

## 2023-04-25 ENCOUNTER — Ambulatory Visit
Admission: RE | Admit: 2023-04-25 | Discharge: 2023-04-25 | Disposition: A | Discharge status: Home / Self Care | Source: Ambulatory Visit | Attending: Obstetrics and Gynecology | Admitting: Obstetrics and Gynecology

## 2023-04-25 DIAGNOSIS — N6310 Unspecified lump in the right breast, unspecified quadrant: Secondary | ICD-10-CM

## 2023-04-25 DIAGNOSIS — N6452 Nipple discharge: Secondary | ICD-10-CM

## 2023-04-26 LAB — SURGICAL PATHOLOGY

## 2023-05-07 ENCOUNTER — Telehealth: Payer: Self-pay | Admitting: *Deleted

## 2023-05-07 ENCOUNTER — Telehealth: Payer: Self-pay | Admitting: Physician Assistant

## 2023-05-07 ENCOUNTER — Ambulatory Visit: Payer: Self-pay | Admitting: Surgery

## 2023-05-07 DIAGNOSIS — D241 Benign neoplasm of right breast: Secondary | ICD-10-CM

## 2023-05-07 NOTE — Telephone Encounter (Signed)
 Pt has tele preop appt 05/08/23. Med rec and consent are done. Pt would like to have tele ASAP as she is having a lumpectomy.

## 2023-05-07 NOTE — H&P (View-Only) (Signed)
 Subjective   Chief Complaint: Breast Problem     History of Present Illness: [name] is a 63 y.o. female who is seen today as an office consultation at the request of Dr. Arelia Sneddon for evaluation of Breast Problem .   This is a 63 year old female with no family history of breast cancer who presents with a 31-month history of some clear right nipple discharge.  She underwent a mammogram that revealed a mass in the right breast located at 9:00, 1 cm from the nipple.  There is a mass in the duct measuring 4 mm in diameter.  The axilla was negative.  Biopsy was obtained that revealed benign intraductal papilloma.  She continues to have nipple discharge.  Family history of breast cancer in a maternal aunt   Review of Systems: A complete review of systems was obtained from the patient.  I have reviewed this information and discussed as appropriate with the patient.  See HPI as well for other ROS.  Review of Systems  Constitutional: Negative.   HENT: Negative.    Eyes: Negative.   Respiratory: Negative.    Cardiovascular: Negative.   Gastrointestinal: Negative.   Genitourinary: Negative.   Musculoskeletal:  Positive for joint pain.  Skin: Negative.   Neurological: Negative.   Endo/Heme/Allergies: Negative.   Psychiatric/Behavioral: Negative.        Medical History: Past Medical History:  Diagnosis Date   Arrhythmia    Arthritis    Diabetes mellitus without complication (CMS/HHS-HCC)    DVT (deep venous thrombosis) (CMS/HHS-HCC)    GERD (gastroesophageal reflux disease)     Patient Active Problem List  Diagnosis   ASD (atrial septal defect) (HHS-HCC)   Chronic anticoagulation   Controlled type 2 diabetes mellitus with diabetic nephropathy (CMS/HHS-HCC)   DVT (deep venous thrombosis) (CMS/HHS-HCC)   History of pulmonary embolism   Atrial fibrillation (CMS/HHS-HCC)   Anemia   Asthma (HHS-HCC)   Cellulitis of right knee   Diabetes mellitus with complication  (CMS/HHS-HCC)   History of loop electrosurgical excision procedure (LEEP)   History of total knee replacement   Leg edema   Obesity   Pulmonary embolism (CMS/HHS-HCC)   Infection of prosthetic right knee joint ()   Paroxysmal atrial fibrillation (CMS/HHS-HCC)    History reviewed. No pertinent surgical history.   Allergies  Allergen Reactions   Iodinated Contrast Media Other (See Comments) and Shortness Of Breath    APNEA  Other reaction(s): Other (See Comments)  APNEA  apnea  Other reaction(s): Other (See Comments)  APNEA  apnea  APNEA   Penicillins Nausea, Itching, Other (See Comments), Rash and Unknown    PATIENT HAD A PCN REACTION WITH IMMEDIATE RASH, FACIAL/TONGUE/THROAT SWELLING, SOB, OR LIGHTHEADEDNESS WITH HYPOTENSION:  #  #  #  YES  #  #  #    Has patient had a PCN reaction causing severe rash involving mucus membranes or skin necrosis: NO  Has patient had a PCN reaction that required hospitalization NO  Has patient had a PCN reaction occurring within the last 10 years: NO  PATIENT HAD A PCN REACTION WITH IMMEDIATE RASH, FACIAL/TONGUE/THROAT SWELLING, SOB, OR LIGHTHEADEDNESS WITH HYPOTENSION:  #  #  #  YES  #  #  #    Has patient had a PCN reaction causing severe rash involving mucus membranes or skin necrosis: NO  Has patient had a PCN reaction that required hospitalization NO  Has patient had a PCN reaction occurring within the last 10 years: NO  PATIENT HAD A PCN REACTION WITH IMMEDIATE RASH, FACIAL/TONGUE/THROAT SWELLING, SOB, OR LIGHTHEADEDNESS WITH HYPOTENSION:  #  #  #  YES  #  #  #    Has patient had a PCN reaction causing severe rash involving mucus membranes or skin necrosis: NO  Has patient had a PCN reaction that required hospitalization NO  Has patient had a PCN reaction occurring within the last 10 years: NO   Cefazolin Other (See Comments)    Pill for only  Nausea and vomiting    Current Outpatient Medications on File Prior to Visit  Medication Sig  Dispense Refill   MOUNJARO 5 mg/0.5 mL pen injector 1 (ONE) PRE-FILLED PEN SYRINGE UNDER SKIN ONCE A WEEK     pantoprazole (PROTONIX) 40 MG DR tablet Take 40 mg by mouth every morning     propranoloL (INDERAL) 20 MG tablet Take 20 mg by mouth     sulfamethoxazole-trimethoprim (BACTRIM DS) 800-160 mg tablet Take 1 tablet by mouth 2 (two) times daily     ferrous sulfate 325 (65 FE) MG EC tablet Take 325 mg by mouth daily with breakfast     metoprolol SUCCinate (TOPROL-XL) 25 MG XL tablet Take 25 mg by mouth once daily     rivaroxaban (XARELTO) 20 mg tablet Xarelto     No current facility-administered medications on file prior to visit.    Family History  Problem Relation Age of Onset   Diabetes Mother    High blood pressure (Hypertension) Father    Diabetes Father      Social History   Tobacco Use  Smoking Status Never  Smokeless Tobacco Never     Social History   Socioeconomic History   Marital status: Unknown  Tobacco Use   Smoking status: Never   Smokeless tobacco: Never  Vaping Use   Vaping status: Never Used  Substance and Sexual Activity   Alcohol use: Never   Drug use: Never   Social Drivers of Health   Housing Stability: Unknown (05/07/2023)   Housing Stability Vital Sign    Homeless in the Last Year: No    Objective:    Vitals:   05/07/23 1357 05/07/23 1358  BP: 124/78   Pulse: 72   Temp: 36.6 C (97.9 F)   SpO2: 96%   Weight: (!) 142.4 kg (314 lb)   Height: 167.6 cm (5\' 6" )   PainSc:  0-No pain    Body mass index is 50.68 kg/m.  Physical Exam   Constitutional:  WDWN in NAD, conversant, no obvious deformities; lying in bed comfortably Eyes:  Pupils equal, round; sclera anicteric; moist conjunctiva; no lid lag HENT:  Oral mucosa moist; good dentition  Neck:  No masses palpated, trachea midline; no thyromegaly Lungs:  CTA bilaterally; normal respiratory effort Breasts:  symmetric, no nipple changes; clear nipple discharge from central right  nipple. No palpable masses or lymphadenopathy on either side CV:  Regular rate and rhythm; no murmurs; extremities well-perfused with no edema Abd:  +bowel sounds, soft, non-tender, no palpable organomegaly; no palpable hernias Musc: Normal gait; no apparent clubbing or cyanosis in extremities Lymphatic:  No palpable cervical or axillary lymphadenopathy Skin:  Warm, dry; no sign of jaundice Psychiatric - alert and oriented x 4; calm mood and affect   Labs, Imaging and Diagnostic Testing:  FINAL DIAGNOSIS        1. Breast, right, needle core biopsy, 9 o'clock, 1cmfn (heart clip) :       BENIGN INTRADUCTAL PAPILLOMA  FIBROCYSTIC CHANGES INCLUDING STROMAL FIBROSIS, USUAL DUCT HYPERPLASIA AND       ADENOSIS       NEGATIVE FOR MICROCALCIFICATIONS       NEGATIVE FOR ATYPIA AND CARCINOMA        Diagnosis Note : Diagnosis called to Orlie Pollen at [facility]       Imaging by Dr. Venetia Night on 04/26/2023 at 8:35 AM.     CLINICAL DATA:  Right nonspontaneous clear nipple discharge for approximately 2 months.   EXAM: DIGITAL DIAGNOSTIC UNILATERAL RIGHT MAMMOGRAM WITH TOMOSYNTHESIS AND CAD; ULTRASOUND RIGHT BREAST LIMITED   TECHNIQUE: Right digital diagnostic mammography and breast tomosynthesis was performed. The images were evaluated with computer-aided detection. ; Targeted ultrasound examination of the right breast was performed   COMPARISON:  Previous exam(s).   ACR Breast Density Category b: There are scattered areas of fibroglandular density.   FINDINGS: Mammographically, there are no new suspicious masses, areas of architectural distortion or clustered microcalcifications in the right breast.   Targeted right breast ultrasound is performed and demonstrates 9 o'clock 1 cm from the nipple mildly dilated duct with possible intraductal mass versus debris. The most prominent masslike widening of the duct measures 0.4 x 0.4 x 0.4 cm. There is no evidence  of right axillary lymphadenopathy.   IMPRESSION: Right breast 9 o'clock mild duct ectasia with potential intraductal mass, for which ultrasound-guided core needle biopsy is recommended.   RECOMMENDATION: Ultrasound-guided core needle biopsy of the right breast.   I have discussed the findings and recommendations with the patient. If applicable, a reminder letter will be sent to the patient regarding the next appointment.   BI-RADS CATEGORY  4: Suspicious.     Electronically Signed   By: Ted Mcalpine M.D.   On: 04/16/2023 10:02  Assessment and Plan:  Diagnoses and all orders for this visit:  Intraductal papilloma of breast, right  Right nipple discharge  Recommend right breast radioactive seed localized excisional biopsy.  The surgical procedure has been discussed with the patient.  Potential risks, benefits, alternative treatments, and expected outcomes have been explained.  All of the patient's questions at this time have been answered.  The likelihood of reaching the patient's treatment goal is good.  The patient understands the proposed surgical procedure and wishes to proceed.    Breonia Kirstein Delbert Harness, MD  05/07/2023 4:22 PM

## 2023-05-07 NOTE — Telephone Encounter (Signed)
   Name: [name]  DOB: [birth year]  MRN: [id number]  Primary Cardiologist: Kristeen Miss, MD  Chart reviewed as part of pre-operative protocol coverage. Because of Auriana Scalia Tinch's past medical history and time since last visit, she will require a follow-up telephone visit in order to better assess preoperative cardiovascular risk.  Pre-op covering staff: - Please schedule appointment and call patient to inform them. If patient already had an upcoming appointment within acceptable timeframe, please add "pre-op clearance" to the appointment notes so provider is aware. - Please contact requesting surgeon's office via preferred method (i.e, phone, fax) to inform them of need for appointment prior to surgery.  Per office protocol, patient can hold Xarelto  for 2-3 days prior to procedure.  Please resume when medically safe to do so.   Sharlene Dory, PA-C  05/07/2023, 4:14 PM

## 2023-05-07 NOTE — Telephone Encounter (Signed)
 Pt has tele preop appt 05/08/23. Med rec and consent are done. Pt would like to have tele ASAP as she is having a lumpectomy.     Patient Consent for Virtual Visit        [name] has provided verbal consent on 05/07/2023 for a virtual visit (video or telephone).   CONSENT FOR VIRTUAL VISIT FOR:  [name]  By participating in this virtual visit I agree to the following:  I hereby voluntarily request, consent and authorize [facility] and its employed or contracted physicians, physician assistants, nurse practitioners or other licensed health care professionals (the Practitioner), to provide me with telemedicine health care services (the "Services") as deemed necessary by the treating Practitioner. I acknowledge and consent to receive the Services by the Practitioner via telemedicine. I understand that the telemedicine visit will involve communicating with the Practitioner through live audiovisual communication technology and the disclosure of certain medical information by electronic transmission. I acknowledge that I have been given the opportunity to request an in-person assessment or other available alternative prior to the telemedicine visit and am voluntarily participating in the telemedicine visit.  I understand that I have the right to withhold or withdraw my consent to the use of telemedicine in the course of my care at any time, without affecting my right to future care or treatment, and that the Practitioner or I may terminate the telemedicine visit at any time. I understand that I have the right to inspect all information obtained and/or recorded in the course of the telemedicine visit and may receive copies of available information for a reasonable fee.  I understand that some of the potential risks of receiving the Services via telemedicine include:  Delay or interruption in medical evaluation due to technological equipment failure or disruption; Information  transmitted may not be sufficient (e.g. poor resolution of images) to allow for appropriate medical decision making by the Practitioner; and/or  In rare instances, security protocols could fail, causing a breach of personal health information.  Furthermore, I acknowledge that it is my responsibility to provide information about my medical history, conditions and care that is complete and accurate to the best of my ability. I acknowledge that Practitioner's advice, recommendations, and/or decision may be based on factors not within their control, such as incomplete or inaccurate data provided by me or distortions of diagnostic images or specimens that may result from electronic transmissions. I understand that the practice of medicine is not an exact science and that Practitioner makes no warranties or guarantees regarding treatment outcomes. I acknowledge that a copy of this consent can be made available to me via my patient portal [facility] MyChart), or I can request a printed copy by calling the office of [facility].    I understand that my insurance will be billed for this visit.   I have read or had this consent read to me. I understand the contents of this consent, which adequately explains the benefits and risks of the Services being provided via telemedicine.  I have been provided ample opportunity to ask questions regarding this consent and the Services and have had my questions answered to my satisfaction. I give my informed consent for the services to be provided through the use of telemedicine in my medical care

## 2023-05-07 NOTE — Telephone Encounter (Signed)
 Patient with diagnosis of Afib on Xarelto for anticoagulation.    Procedure: right breast lumpectomy  Date of procedure: TBD   CHA2DS2-VASc Score = 2   This indicates a 2.2% annual risk of stroke. The patient's score is based upon: CHF History: 0 HTN History: 0 Diabetes History: 1 Stroke History: 0 Vascular Disease History: 0 Age Score: 0 Gender Score: 1       CrCl >100 mL/min  Platelet count 157 K    Per office protocol, patient can hold Xarelto  for 2-3 days prior to procedure.     **This guidance is not considered finalized until pre-operative APP has relayed final recommendations.**

## 2023-05-07 NOTE — H&P (Signed)
 Subjective   Chief Complaint: Breast Problem     History of Present Illness: [name] is a 63 y.o. female who is seen today as an office consultation at the request of Dr. Arelia Sneddon for evaluation of Breast Problem .   This is a 63 year old female with no family history of breast cancer who presents with a 31-month history of some clear right nipple discharge.  She underwent a mammogram that revealed a mass in the right breast located at 9:00, 1 cm from the nipple.  There is a mass in the duct measuring 4 mm in diameter.  The axilla was negative.  Biopsy was obtained that revealed benign intraductal papilloma.  She continues to have nipple discharge.  Family history of breast cancer in a maternal aunt   Review of Systems: A complete review of systems was obtained from the patient.  I have reviewed this information and discussed as appropriate with the patient.  See HPI as well for other ROS.  Review of Systems  Constitutional: Negative.   HENT: Negative.    Eyes: Negative.   Respiratory: Negative.    Cardiovascular: Negative.   Gastrointestinal: Negative.   Genitourinary: Negative.   Musculoskeletal:  Positive for joint pain.  Skin: Negative.   Neurological: Negative.   Endo/Heme/Allergies: Negative.   Psychiatric/Behavioral: Negative.        Medical History: Past Medical History:  Diagnosis Date   Arrhythmia    Arthritis    Diabetes mellitus without complication (CMS/HHS-HCC)    DVT (deep venous thrombosis) (CMS/HHS-HCC)    GERD (gastroesophageal reflux disease)     Patient Active Problem List  Diagnosis   ASD (atrial septal defect) (HHS-HCC)   Chronic anticoagulation   Controlled type 2 diabetes mellitus with diabetic nephropathy (CMS/HHS-HCC)   DVT (deep venous thrombosis) (CMS/HHS-HCC)   History of pulmonary embolism   Atrial fibrillation (CMS/HHS-HCC)   Anemia   Asthma (HHS-HCC)   Cellulitis of right knee   Diabetes mellitus with complication  (CMS/HHS-HCC)   History of loop electrosurgical excision procedure (LEEP)   History of total knee replacement   Leg edema   Obesity   Pulmonary embolism (CMS/HHS-HCC)   Infection of prosthetic right knee joint ()   Paroxysmal atrial fibrillation (CMS/HHS-HCC)    History reviewed. No pertinent surgical history.   Allergies  Allergen Reactions   Iodinated Contrast Media Other (See Comments) and Shortness Of Breath    APNEA  Other reaction(s): Other (See Comments)  APNEA  apnea  Other reaction(s): Other (See Comments)  APNEA  apnea  APNEA   Penicillins Nausea, Itching, Other (See Comments), Rash and Unknown    PATIENT HAD A PCN REACTION WITH IMMEDIATE RASH, FACIAL/TONGUE/THROAT SWELLING, SOB, OR LIGHTHEADEDNESS WITH HYPOTENSION:  #  #  #  YES  #  #  #    Has patient had a PCN reaction causing severe rash involving mucus membranes or skin necrosis: NO  Has patient had a PCN reaction that required hospitalization NO  Has patient had a PCN reaction occurring within the last 10 years: NO  PATIENT HAD A PCN REACTION WITH IMMEDIATE RASH, FACIAL/TONGUE/THROAT SWELLING, SOB, OR LIGHTHEADEDNESS WITH HYPOTENSION:  #  #  #  YES  #  #  #    Has patient had a PCN reaction causing severe rash involving mucus membranes or skin necrosis: NO  Has patient had a PCN reaction that required hospitalization NO  Has patient had a PCN reaction occurring within the last 10 years: NO  PATIENT HAD A PCN REACTION WITH IMMEDIATE RASH, FACIAL/TONGUE/THROAT SWELLING, SOB, OR LIGHTHEADEDNESS WITH HYPOTENSION:  #  #  #  YES  #  #  #    Has patient had a PCN reaction causing severe rash involving mucus membranes or skin necrosis: NO  Has patient had a PCN reaction that required hospitalization NO  Has patient had a PCN reaction occurring within the last 10 years: NO   Cefazolin Other (See Comments)    Pill for only  Nausea and vomiting    Current Outpatient Medications on File Prior to Visit  Medication Sig  Dispense Refill   MOUNJARO 5 mg/0.5 mL pen injector 1 (ONE) PRE-FILLED PEN SYRINGE UNDER SKIN ONCE A WEEK     pantoprazole (PROTONIX) 40 MG DR tablet Take 40 mg by mouth every morning     propranoloL (INDERAL) 20 MG tablet Take 20 mg by mouth     sulfamethoxazole-trimethoprim (BACTRIM DS) 800-160 mg tablet Take 1 tablet by mouth 2 (two) times daily     ferrous sulfate 325 (65 FE) MG EC tablet Take 325 mg by mouth daily with breakfast     metoprolol SUCCinate (TOPROL-XL) 25 MG XL tablet Take 25 mg by mouth once daily     rivaroxaban (XARELTO) 20 mg tablet Xarelto     No current facility-administered medications on file prior to visit.    Family History  Problem Relation Age of Onset   Diabetes Mother    High blood pressure (Hypertension) Father    Diabetes Father      Social History   Tobacco Use  Smoking Status Never  Smokeless Tobacco Never     Social History   Socioeconomic History   Marital status: Unknown  Tobacco Use   Smoking status: Never   Smokeless tobacco: Never  Vaping Use   Vaping status: Never Used  Substance and Sexual Activity   Alcohol use: Never   Drug use: Never   Social Drivers of Health   Housing Stability: Unknown (05/07/2023)   Housing Stability Vital Sign    Homeless in the Last Year: No    Objective:    Vitals:   05/07/23 1357 05/07/23 1358  BP: 124/78   Pulse: 72   Temp: 36.6 C (97.9 F)   SpO2: 96%   Weight: (!) 142.4 kg (314 lb)   Height: 167.6 cm (5\' 6" )   PainSc:  0-No pain    Body mass index is 50.68 kg/m.  Physical Exam   Constitutional:  WDWN in NAD, conversant, no obvious deformities; lying in bed comfortably Eyes:  Pupils equal, round; sclera anicteric; moist conjunctiva; no lid lag HENT:  Oral mucosa moist; good dentition  Neck:  No masses palpated, trachea midline; no thyromegaly Lungs:  CTA bilaterally; normal respiratory effort Breasts:  symmetric, no nipple changes; clear nipple discharge from central right  nipple. No palpable masses or lymphadenopathy on either side CV:  Regular rate and rhythm; no murmurs; extremities well-perfused with no edema Abd:  +bowel sounds, soft, non-tender, no palpable organomegaly; no palpable hernias Musc: Normal gait; no apparent clubbing or cyanosis in extremities Lymphatic:  No palpable cervical or axillary lymphadenopathy Skin:  Warm, dry; no sign of jaundice Psychiatric - alert and oriented x 4; calm mood and affect   Labs, Imaging and Diagnostic Testing:  FINAL DIAGNOSIS        1. Breast, right, needle core biopsy, 9 o'clock, 1cmfn (heart clip) :       BENIGN INTRADUCTAL PAPILLOMA  FIBROCYSTIC CHANGES INCLUDING STROMAL FIBROSIS, USUAL DUCT HYPERPLASIA AND       ADENOSIS       NEGATIVE FOR MICROCALCIFICATIONS       NEGATIVE FOR ATYPIA AND CARCINOMA        Diagnosis Note : Diagnosis called to Orlie Pollen at [facility]       Imaging by Dr. Venetia Night on 04/26/2023 at 8:35 AM.     CLINICAL DATA:  Right nonspontaneous clear nipple discharge for approximately 2 months.   EXAM: DIGITAL DIAGNOSTIC UNILATERAL RIGHT MAMMOGRAM WITH TOMOSYNTHESIS AND CAD; ULTRASOUND RIGHT BREAST LIMITED   TECHNIQUE: Right digital diagnostic mammography and breast tomosynthesis was performed. The images were evaluated with computer-aided detection. ; Targeted ultrasound examination of the right breast was performed   COMPARISON:  Previous exam(s).   ACR Breast Density Category b: There are scattered areas of fibroglandular density.   FINDINGS: Mammographically, there are no new suspicious masses, areas of architectural distortion or clustered microcalcifications in the right breast.   Targeted right breast ultrasound is performed and demonstrates 9 o'clock 1 cm from the nipple mildly dilated duct with possible intraductal mass versus debris. The most prominent masslike widening of the duct measures 0.4 x 0.4 x 0.4 cm. There is no evidence  of right axillary lymphadenopathy.   IMPRESSION: Right breast 9 o'clock mild duct ectasia with potential intraductal mass, for which ultrasound-guided core needle biopsy is recommended.   RECOMMENDATION: Ultrasound-guided core needle biopsy of the right breast.   I have discussed the findings and recommendations with the patient. If applicable, a reminder letter will be sent to the patient regarding the next appointment.   BI-RADS CATEGORY  4: Suspicious.     Electronically Signed   By: Ted Mcalpine M.D.   On: 04/16/2023 10:02  Assessment and Plan:  Diagnoses and all orders for this visit:  Intraductal papilloma of breast, right  Right nipple discharge  Recommend right breast radioactive seed localized excisional biopsy.  The surgical procedure has been discussed with the patient.  Potential risks, benefits, alternative treatments, and expected outcomes have been explained.  All of the patient's questions at this time have been answered.  The likelihood of reaching the patient's treatment goal is good.  The patient understands the proposed surgical procedure and wishes to proceed.    Breonia Kirstein Delbert Harness, MD  05/07/2023 4:22 PM

## 2023-05-07 NOTE — Telephone Encounter (Signed)
   Pre-operative Risk Assessment    Patient Name: [name]  DOB: Jun 08, 1960 MRN: [id number]   Date of last office visit: 02/13/2023 Date of next office visit: 06/19/2023   Request for Surgical Clearance    Procedure:   right breast lumpectomy  Date of Surgery:  Clearance TBD                                Surgeon:  Dr. Manus Rudd Surgeon's Group or Practice Name:  [facility]  Phone number:  [phone] Fax number:  [phone]   Type of Clearance Requested:   - Medical  - Pharmacy:  Hold Rivaroxaban (Xarelto) need instruction   Type of Anesthesia:  General    Additional requests/questions:    [name]   05/07/2023, 2:51 PM

## 2023-05-08 ENCOUNTER — Ambulatory Visit: Attending: Cardiovascular Disease | Admitting: Student

## 2023-05-08 DIAGNOSIS — Z01810 Encounter for preprocedural cardiovascular examination: Secondary | ICD-10-CM | POA: Diagnosis not present

## 2023-05-08 NOTE — Progress Notes (Signed)
 Virtual Visit via Telephone Note   Because of [name]'s co-morbid illnesses, she is at least at moderate risk for complications without adequate follow up.  This format is felt to be most appropriate for this patient at this time.  The patient did not have access to video technology/had technical difficulties with video requiring transitioning to audio format only (telephone).  All issues noted in this document were discussed and addressed.  No physical exam could be performed with this format.  Please refer to the patient's chart for her consent to telehealth for [facility].  Evaluation Performed:  Preoperative cardiovascular risk assessment _____________   Date:  05/08/2023   Patient ID:  [name], DOB [birth year], MRN [id number] Patient Location:  Home Provider location:   Office  Primary Care Provider:  Lise Auer, MD Primary Cardiologist:  Kristeen Miss, MD  Chief Complaint / Patient Profile   63 y.o. y/o female with a h/o PAF on anticoagulation, PE/DVT, asthma, T2DM who is pending right breast lumpectomy by Dr. Corliss Skains and presents today for telephonic preoperative cardiovascular risk assessment.  History of Present Illness    [name] is a 63 y.o. female who presents via audio/video conferencing for a telehealth visit today.  Pt was last seen in cardiology clinic on 02/13/2023 by Dr. Elease Hashimoto.  At that time VIRGA HALTIWANGER was stable from a cardiac standpoint.  She reported more episodes of palpitations consistent with previous episodes of PAF.  As needed propranolol was added for better control of palpitations.  The patient is now pending procedure as outlined above. Since her last visit, she is doing well. Patient denies shortness of breath, dyspnea on exertion, lower extremity edema, orthopnea or PND. No chest pain, pressure, or tightness. Patient reports increased episodes of palpitations over the last couple of months. She reports first she had  increased episodes of palpitations on flecainide with one episodes for an extended period of time. Episodes stopped after she stopped flecainide. Then about 3 weeks ago she had Covid with respiratory symptoms including a cough. She reports every time she coughed she had a run of palipitations sometimes lasting for greater than 1 hour. She continues to have a mild cough but has not had palpitations for 1.5-2 weeks. She reports she is mostly sedentary. She is hoping to get back to exercising. She does try to walk. She is very active around her home.   Past Medical History    Past Medical History:  Diagnosis Date   Anemia    Arthritis    Asthma 06/01/2021   NO RECENT FLARE UP   Atrial fibrillation (HCC)    Bleeding ulcer 12-2010  hospitalized for 1 week   Blood transfusion    Blood transfusion without reported diagnosis    2011-2 units transfused, bleeding ulcer   Bruises easily    CHF (congestive heart failure) (HCC)    with PE resolved after    Complication of anesthesia    Constipation    Difficulty in swallowing 03/24/2013   no longer a problem   Diverticulitis    DVT (deep venous thrombosis) (HCC) 06/2013   right leg   Dysrhythmia     Hx of A fib    GERD (gastroesophageal reflux disease)    Hyperlipidemia    Leg swelling 03/24/2013   not a problem any longer   Osteoarthritis    Peripheral vascular disease (HCC)    Pre-diabetes    Pulmonary embolus (HCC) 06/2013  Reflux    Shortness of breath    with PE   Vomiting 03/24/2013   problem resolved after lap band revision   Past Surgical History:  Procedure Laterality Date   APPENDECTOMY     BREAST BIOPSY Right 04/25/2023   [place] RT BREAST BX W LOC DEV 1ST LESION IMG BX SPEC US GUIDE 04/25/2023 [facility]   CARPEL TUNNEL BILATEREAL     CHOLECYSTECTOMY     5'12   COLONOSCOPY N/A 04/10/2013   Procedure: COLONOSCOPY;  Surgeon: Theda Belfast, MD;  Location: [facility];  Service: Endoscopy;  Laterality: N/A;   DENTAL  SURGERY  10/2019   tooth implant   DIAGNOSTIC LAPAROSCOPY     ENDOVENOUS ABLATION SAPHENOUS VEIN W/ LASER  06/2008   ENDOVENOUS ABLATION SAPHENOUS VEIN W/ LASER Left 12/09/2013   EVLA LEFT GREATER SAPHENOUS VEIN BY TODD EARLY MD   ESOPHAGOGASTRODUODENOSCOPY  12/18/2010   Procedure: ESOPHAGOGASTRODUODENOSCOPY (EGD);  Surgeon: Iva Boop, MD;  Location: [facility];  Service: Endoscopy;  Laterality: N/A;   I & D KNEE WITH POLY EXCHANGE Right 01/06/2021   Procedure: IRRIGATION AND DEBRIDEMENT RIGHT KNEE WITH  POLY EXCHANGE;  Surgeon: Kathryne Hitch, MD;  Location: [facility] OR;  Service: Orthopedics;  Laterality: Right;   JOINT REPLACEMENT Left    knee   LAPAROSCOPIC GASTRIC BANDING  01/05/2008   LAPAROSCOPIC GASTRIC BANDING  12/18/2010   01/04/2009   SALPHINGOOOPHORECTOMY Right    cyst removal   TONSILLECTOMY     TOTAL KNEE ARTHROPLASTY Left 07/31/2016   Procedure: LEFT TOTAL KNEE ARTHROPLASTY;  Surgeon: Kathryne Hitch, MD;  Location: [facility] OR;  Service: Orthopedics;  Laterality: Left;   TOTAL KNEE ARTHROPLASTY Right 12/23/2020   Procedure: RIGHT TOTAL KNEE ARTHROPLASTY;  Surgeon: Kathryne Hitch, MD;  Location: [facility];  Service: Orthopedics;  Laterality: Right;   WEDGE RESECTION OF OVARY  LEFT OVARY     Allergies  Allergies  Allergen Reactions   Contrast Media [Iodinated Contrast Media] Shortness Of Breath and Other (See Comments)    APNEA   Penicillin G Other (See Comments)   Ancef [Cefazolin Sodium] Nausea And Vomiting    Tolerates IV    Home Medications    Prior to Admission medications   Medication Sig Start Date End Date Taking? Authorizing Provider  acetaminophen (TYLENOL) 325 MG tablet Take 2 tablets (650 mg total) by mouth every 6 (six) hours as needed for mild pain (or Fever >/= 101). 01/13/21   Rhetta Mura, MD  celecoxib (CELEBREX) 200 MG capsule TAKE 1 CAPSULE BY MOUTH EVERY DAY 05/15/22   Nadara Mustard, MD  metoprolol succinate  (TOPROL-XL) 50 MG 24 hr tablet TAKE 1 TABLET BY MOUTH EVERY DAY WITH OR IMMEDIATELY FOLLOWING A MEAL 02/18/23   Nahser, Deloris Ping, MD  metoprolol tartrate (LOPRESSOR) 25 MG tablet TAKE 1 TABLET BY MOUTH UP TO TWICE DAILY AS NEEDED FOR PALPITATIONS (IN ADDITION TO SUCCINATE) 03/19/23   Nahser, Deloris Ping, MD  MOUNJARO 2.5 MG/0.5ML Pen Inject 2.5 mg into the skin once a week. 03/05/23   [provider]  Multiple Vitamin (MULTIVITAMIN) tablet Take 1 tablet by mouth daily.    [provider]  ondansetron (ZOFRAN-ODT) 4 MG disintegrating tablet TAKE 1 TABLET BY MOUTH EVERY 8 HOURS AS NEEDED FOR NAUSEA AND VOMITING 07/04/21   Kathryne Hitch, MD  pantoprazole (PROTONIX) 40 MG tablet Take 40 mg by mouth every morning. 11/22/22   [provider]  polyethylene glycol (MIRALAX / Ethelene Hal)  17 g packet Take 17 g by mouth 2 (two) times daily. 01/13/21   Rhetta Mura, MD  propranolol (INDERAL) 20 MG tablet Take 1 tablet (20 mg total) by mouth 4 (four) times daily as needed (for palpitations or heart rate greater than 110). 02/13/23   Nahser, Deloris Ping, MD  sulfamethoxazole-trimethoprim (BACTRIM DS) 800-160 MG tablet Take 1 tablet by mouth 2 (two) times daily. 02/08/21   Danelle Earthly, MD  tretinoin (RETIN-A) 0.05 % cream Apply topically daily. 01/29/23   [provider]  XARELTO 20 MG TABS tablet Take 20 mg by mouth daily.    [provider]    Physical Exam    Vital Signs:  THANYA CEGIELSKI does not have vital signs available for review today.  Given telephonic nature of communication, physical exam is limited. AAOx3. NAD. Normal affect.  Speech and respirations are unlabored.   Assessment & Plan    Primary Cardiologist: Kristeen Miss, MD  Preoperative cardiovascular risk assessment.  Right breast lumpectomy by Dr. Corliss Skains.  Chart reviewed as part of pre-operative protocol coverage. According to the [facility]CRI, patient has a 0.4% risk of MACE. Patient reports  activity equivalent to 6.61 METS (per [facility]AS[facility]).   Given past medical history and time since last visit, based on ACC/AHA guidelines, CHERIKA JESSIE would be at acceptable risk for the planned procedure without further cardiovascular testing.   Patient was advised that if she develops new symptoms prior to surgery to contact our office to arrange a follow-up appointment.  she verbalized understanding.  Per Pharm D, patient may hold Xarelto for 2-3 days prior to procedure.    I will route this recommendation to the requesting party via Epic fax function.  Please call with questions.  Time:   Today, I have spent 9 minutes with the patient with telehealth technology discussing medical history, symptoms, and management plan.     Carlos Levering, NP  05/08/2023, 11:37 AM

## 2023-05-11 ENCOUNTER — Other Ambulatory Visit: Payer: Self-pay | Admitting: Cardiovascular Disease

## 2023-05-14 ENCOUNTER — Other Ambulatory Visit: Payer: Self-pay | Admitting: Surgery

## 2023-05-14 DIAGNOSIS — D241 Benign neoplasm of right breast: Secondary | ICD-10-CM

## 2023-05-17 ENCOUNTER — Other Ambulatory Visit: Payer: Self-pay

## 2023-05-17 ENCOUNTER — Encounter (HOSPITAL_BASED_OUTPATIENT_CLINIC_OR_DEPARTMENT_OTHER): Payer: Self-pay | Admitting: Surgery

## 2023-05-22 ENCOUNTER — Encounter (HOSPITAL_BASED_OUTPATIENT_CLINIC_OR_DEPARTMENT_OTHER)
Admission: RE | Admit: 2023-05-22 | Discharge: 2023-05-22 | Disposition: A | Discharge status: Home / Self Care | Source: Ambulatory Visit | Attending: Surgery | Admitting: Surgery

## 2023-05-22 ENCOUNTER — Ambulatory Visit
Admission: RE | Admit: 2023-05-22 | Discharge: 2023-05-22 | Disposition: A | Discharge status: Home / Self Care | Source: Ambulatory Visit | Attending: Surgery | Admitting: Surgery

## 2023-05-22 DIAGNOSIS — K219 Gastro-esophageal reflux disease without esophagitis: Secondary | ICD-10-CM | POA: Diagnosis not present

## 2023-05-22 DIAGNOSIS — Z7985 Long-term (current) use of injectable non-insulin antidiabetic drugs: Secondary | ICD-10-CM | POA: Diagnosis not present

## 2023-05-22 DIAGNOSIS — Z803 Family history of malignant neoplasm of breast: Secondary | ICD-10-CM | POA: Diagnosis not present

## 2023-05-22 DIAGNOSIS — N6011 Diffuse cystic mastopathy of right breast: Secondary | ICD-10-CM | POA: Diagnosis not present

## 2023-05-22 DIAGNOSIS — E1151 Type 2 diabetes mellitus with diabetic peripheral angiopathy without gangrene: Secondary | ICD-10-CM | POA: Diagnosis not present

## 2023-05-22 DIAGNOSIS — D241 Benign neoplasm of right breast: Secondary | ICD-10-CM

## 2023-05-22 DIAGNOSIS — E66813 Obesity, class 3: Secondary | ICD-10-CM | POA: Diagnosis not present

## 2023-05-22 DIAGNOSIS — M1990 Unspecified osteoarthritis, unspecified site: Secondary | ICD-10-CM | POA: Diagnosis not present

## 2023-05-22 DIAGNOSIS — Z6841 Body Mass Index (BMI) 40.0 and over, adult: Secondary | ICD-10-CM | POA: Diagnosis not present

## 2023-05-22 DIAGNOSIS — Z9884 Bariatric surgery status: Secondary | ICD-10-CM | POA: Diagnosis not present

## 2023-05-22 DIAGNOSIS — Z86711 Personal history of pulmonary embolism: Secondary | ICD-10-CM | POA: Diagnosis not present

## 2023-05-22 DIAGNOSIS — Z7901 Long term (current) use of anticoagulants: Secondary | ICD-10-CM | POA: Diagnosis not present

## 2023-05-22 DIAGNOSIS — N6452 Nipple discharge: Secondary | ICD-10-CM | POA: Diagnosis not present

## 2023-05-22 DIAGNOSIS — I4891 Unspecified atrial fibrillation: Secondary | ICD-10-CM | POA: Diagnosis not present

## 2023-05-22 LAB — BASIC METABOLIC PANEL WITH GFR
Anion gap: 9 (ref 5–15)
BUN: 9 mg/dL (ref 8–23)
CO2: 24 mmol/L (ref 22–32)
Calcium: 9.2 mg/dL (ref 8.9–10.3)
Chloride: 106 mmol/L (ref 98–111)
Creatinine, Ser: 0.82 mg/dL (ref 0.44–1.00)
GFR, Estimated: >60 mL/min (ref >60)
Glucose, Bld: 87 mg/dL (ref 70–99)
Potassium: 4.5 mmol/L (ref 3.5–5.1)
Sodium: 139 mmol/L (ref 135–145)

## 2023-05-22 MED ORDER — CHLORHEXIDINE GLUCONATE CLOTH 2 % EX PADS
6.0000 | MEDICATED_PAD | Freq: Once | CUTANEOUS | Status: DC
Start: 2023-05-22 — End: 2023-05-23

## 2023-05-22 MED ORDER — CHLORHEXIDINE GLUCONATE CLOTH 2 % EX PADS: 6.0000 | MEDICATED_PAD | Freq: Once | CUTANEOUS | Status: DC

## 2023-05-22 NOTE — Progress Notes (Signed)

## 2023-05-23 ENCOUNTER — Ambulatory Visit
Admission: RE | Admit: 2023-05-23 | Discharge: 2023-05-23 | Disposition: A | Discharge status: Home / Self Care | Source: Ambulatory Visit | Attending: Surgery | Admitting: Surgery

## 2023-05-23 ENCOUNTER — Other Ambulatory Visit: Payer: Self-pay

## 2023-05-23 ENCOUNTER — Ambulatory Visit (HOSPITAL_BASED_OUTPATIENT_CLINIC_OR_DEPARTMENT_OTHER): Admitting: Anesthesiology

## 2023-05-23 ENCOUNTER — Encounter (HOSPITAL_BASED_OUTPATIENT_CLINIC_OR_DEPARTMENT_OTHER): Payer: Self-pay | Admitting: Surgery

## 2023-05-23 ENCOUNTER — Ambulatory Visit (HOSPITAL_BASED_OUTPATIENT_CLINIC_OR_DEPARTMENT_OTHER)
Admission: RE | Admit: 2023-05-23 | Discharge: 2023-05-23 | Disposition: A | Discharge status: Home / Self Care | Attending: Surgery | Admitting: Surgery

## 2023-05-23 ENCOUNTER — Encounter (HOSPITAL_BASED_OUTPATIENT_CLINIC_OR_DEPARTMENT_OTHER)
Admission: RE | Disposition: A | Discharge status: Home / Self Care | Payer: Self-pay | Source: Home / Self Care | Attending: Surgery

## 2023-05-23 DIAGNOSIS — N6011 Diffuse cystic mastopathy of right breast: Secondary | ICD-10-CM | POA: Insufficient documentation

## 2023-05-23 DIAGNOSIS — D241 Benign neoplasm of right breast: Secondary | ICD-10-CM | POA: Insufficient documentation

## 2023-05-23 DIAGNOSIS — E119 Type 2 diabetes mellitus without complications: Secondary | ICD-10-CM | POA: Diagnosis not present

## 2023-05-23 DIAGNOSIS — M1990 Unspecified osteoarthritis, unspecified site: Secondary | ICD-10-CM | POA: Insufficient documentation

## 2023-05-23 DIAGNOSIS — N6452 Nipple discharge: Secondary | ICD-10-CM

## 2023-05-23 DIAGNOSIS — E66813 Obesity, class 3: Secondary | ICD-10-CM | POA: Insufficient documentation

## 2023-05-23 DIAGNOSIS — I4891 Unspecified atrial fibrillation: Secondary | ICD-10-CM | POA: Diagnosis not present

## 2023-05-23 DIAGNOSIS — K219 Gastro-esophageal reflux disease without esophagitis: Secondary | ICD-10-CM | POA: Insufficient documentation

## 2023-05-23 DIAGNOSIS — E1151 Type 2 diabetes mellitus with diabetic peripheral angiopathy without gangrene: Secondary | ICD-10-CM | POA: Insufficient documentation

## 2023-05-23 DIAGNOSIS — Z803 Family history of malignant neoplasm of breast: Secondary | ICD-10-CM | POA: Insufficient documentation

## 2023-05-23 DIAGNOSIS — Z7985 Long-term (current) use of injectable non-insulin antidiabetic drugs: Secondary | ICD-10-CM | POA: Insufficient documentation

## 2023-05-23 DIAGNOSIS — Z6841 Body Mass Index (BMI) 40.0 and over, adult: Secondary | ICD-10-CM | POA: Insufficient documentation

## 2023-05-23 DIAGNOSIS — Z9884 Bariatric surgery status: Secondary | ICD-10-CM | POA: Insufficient documentation

## 2023-05-23 DIAGNOSIS — Z86711 Personal history of pulmonary embolism: Secondary | ICD-10-CM | POA: Insufficient documentation

## 2023-05-23 DIAGNOSIS — E118 Type 2 diabetes mellitus with unspecified complications: Secondary | ICD-10-CM

## 2023-05-23 DIAGNOSIS — Z7901 Long term (current) use of anticoagulants: Secondary | ICD-10-CM | POA: Insufficient documentation

## 2023-05-23 HISTORY — PX: RADIOACTIVE SEED GUIDED EXCISIONAL BREAST BIOPSY: SHX6490

## 2023-05-23 HISTORY — DX: Benign neoplasm of right breast: D24.1

## 2023-05-23 SURGERY — RADIOACTIVE SEED GUIDED AXILLARY SENTINEL LYMPH NODE BIOPSY
Anesthesia: General | Site: Breast | Laterality: Right

## 2023-05-23 MED ORDER — OXYCODONE HCL 5 MG/5ML PO SOLN: 5.0000 mg | Freq: Once | ORAL | Status: DC | PRN

## 2023-05-23 MED ORDER — DEXAMETHASONE SODIUM PHOSPHATE 10 MG/ML IJ SOLN
INTRAMUSCULAR | Status: DC | PRN
  Administered 2023-05-23: 10 mg via INTRAVENOUS

## 2023-05-23 MED ORDER — LIDOCAINE 2% (20 MG/ML) 5 ML SYRINGE
INTRAMUSCULAR | Status: DC | PRN
  Administered 2023-05-23: 100 mg via INTRAVENOUS

## 2023-05-23 MED ORDER — PROPOFOL 500 MG/50ML IV EMUL
INTRAVENOUS | Status: DC | PRN
  Administered 2023-05-23 (×2): 150 ug/kg/min via INTRAVENOUS

## 2023-05-23 MED ORDER — BUPIVACAINE-EPINEPHRINE 0.25% -1:200000 IJ SOLN
INTRAMUSCULAR | Status: DC | PRN
  Administered 2023-05-23: 10 mL

## 2023-05-23 MED ORDER — ACETAMINOPHEN 500 MG PO TABS
ORAL_TABLET | ORAL | Status: AC
  Filled 2023-05-23: qty 2

## 2023-05-23 MED ORDER — EPHEDRINE SULFATE-NACL 50-0.9 MG/10ML-% IV SOSY
PREFILLED_SYRINGE | INTRAVENOUS | Status: DC | PRN
  Administered 2023-05-23: 5 mg via INTRAVENOUS

## 2023-05-23 MED ORDER — CELECOXIB 200 MG PO CAPS
ORAL_CAPSULE | ORAL | Status: AC
  Filled 2023-05-23: qty 1

## 2023-05-23 MED ORDER — MIDAZOLAM HCL 2 MG/2ML IJ SOLN
INTRAMUSCULAR | Status: DC | PRN
  Administered 2023-05-23: 2 mg via INTRAVENOUS

## 2023-05-23 MED ORDER — LACTATED RINGERS IV SOLN: INTRAVENOUS | Status: DC

## 2023-05-23 MED ORDER — ONDANSETRON HCL 4 MG/2ML IJ SOLN
INTRAMUSCULAR | Status: DC | PRN
  Administered 2023-05-23: 4 mg via INTRAVENOUS

## 2023-05-23 MED ORDER — FENTANYL CITRATE (PF) 100 MCG/2ML IJ SOLN: 25.0000 ug | INTRAMUSCULAR | Status: DC | PRN

## 2023-05-23 MED ORDER — PROPOFOL 10 MG/ML IV BOLUS
INTRAVENOUS | Status: AC
  Filled 2023-05-23: qty 20

## 2023-05-23 MED ORDER — ACETAMINOPHEN 500 MG PO TABS
1000.0000 mg | ORAL_TABLET | Freq: Once | ORAL | Status: AC
  Administered 2023-05-23: 1000 mg via ORAL

## 2023-05-23 MED ORDER — ONDANSETRON HCL 4 MG/2ML IJ SOLN: 4.0000 mg | Freq: Once | INTRAMUSCULAR | Status: DC | PRN

## 2023-05-23 MED ORDER — OXYCODONE HCL 5 MG PO TABS: 5.0000 mg | ORAL_TABLET | Freq: Once | ORAL | Status: DC | PRN

## 2023-05-23 MED ORDER — MIDAZOLAM HCL 2 MG/2ML IJ SOLN
INTRAMUSCULAR | Status: AC
  Filled 2023-05-23: qty 2

## 2023-05-23 MED ORDER — PROPOFOL 10 MG/ML IV BOLUS
INTRAVENOUS | Status: DC | PRN
  Administered 2023-05-23: 200 ug via INTRAVENOUS

## 2023-05-23 MED ORDER — FENTANYL CITRATE (PF) 100 MCG/2ML IJ SOLN
INTRAMUSCULAR | Status: DC | PRN
  Administered 2023-05-23: 50 ug via INTRAVENOUS

## 2023-05-23 MED ORDER — VANCOMYCIN HCL 1500 MG/300ML IV SOLN
1500.0000 mg | INTRAVENOUS | Status: AC
  Administered 2023-05-23: 1500 mg via INTRAVENOUS
  Filled 2023-05-23: qty 300

## 2023-05-23 MED ORDER — DEXMEDETOMIDINE HCL IN NACL 80 MCG/20ML IV SOLN
INTRAVENOUS | Status: AC
  Filled 2023-05-23: qty 20

## 2023-05-23 MED ORDER — ACETAMINOPHEN 500 MG PO TABS: 1000.0000 mg | ORAL_TABLET | ORAL | Status: AC

## 2023-05-23 MED ORDER — CELECOXIB 200 MG PO CAPS
200.0000 mg | ORAL_CAPSULE | Freq: Once | ORAL | Status: AC
  Administered 2023-05-23: 200 mg via ORAL

## 2023-05-23 MED ORDER — FENTANYL CITRATE (PF) 100 MCG/2ML IJ SOLN
INTRAMUSCULAR | Status: AC
  Filled 2023-05-23: qty 2

## 2023-05-23 MED ORDER — MEPERIDINE HCL 25 MG/ML IJ SOLN: 6.2500 mg | INTRAMUSCULAR | Status: DC | PRN

## 2023-05-23 SURGICAL SUPPLY — 39 items
APPLIER CLIP 9.375 MED OPEN (MISCELLANEOUS) IMPLANT
BENZOIN TINCTURE PRP APPL 2/3 (GAUZE/BANDAGES/DRESSINGS) ×1 IMPLANT
BLADE HEX COATED 2.75 (ELECTRODE) ×1 IMPLANT
BLADE SURG 15 STRL LF DISP TIS (BLADE) ×1 IMPLANT
CANISTER SUCT 1200ML W/VALVE (MISCELLANEOUS) ×1 IMPLANT
CHLORAPREP W/TINT 26 (MISCELLANEOUS) ×1 IMPLANT
CLIP APPLIE 9.375 MED OPEN (MISCELLANEOUS) IMPLANT
COVER BACK TABLE 60X90IN (DRAPES) ×1 IMPLANT
COVER MAYO STAND STRL (DRAPES) ×1 IMPLANT
COVER PROBE CYLINDRICAL 5X96 (MISCELLANEOUS) ×1 IMPLANT
DRAPE LAPAROTOMY 100X72 PEDS (DRAPES) ×1 IMPLANT
DRAPE UTILITY XL STRL (DRAPES) ×1 IMPLANT
DRSG TEGADERM 4X4.75 (GAUZE/BANDAGES/DRESSINGS) ×1 IMPLANT
ELECT REM PT RETURN 9FT ADLT (ELECTROSURGICAL) ×1 IMPLANT
ELECTRODE REM PT RTRN 9FT ADLT (ELECTROSURGICAL) ×1 IMPLANT
GAUZE SPONGE 2X2 STRL 8-PLY (GAUZE/BANDAGES/DRESSINGS) IMPLANT
GAUZE SPONGE 4X4 12PLY STRL LF (GAUZE/BANDAGES/DRESSINGS) ×1 IMPLANT
GLOVE BIO SURGEON STRL SZ7 (GLOVE) ×1 IMPLANT
GLOVE BIOGEL PI IND STRL 7.5 (GLOVE) ×1 IMPLANT
GLOVE SURG SYN 7.0 (GLOVE) ×1 IMPLANT
GLOVE SURG SYN 7.0 PF PI (GLOVE) IMPLANT
GOWN STRL REUS W/ TWL LRG LVL3 (GOWN DISPOSABLE) ×2 IMPLANT
KIT MARKER MARGIN INK (KITS) ×1 IMPLANT
NDL HYPO 25X1 1.5 SAFETY (NEEDLE) ×1 IMPLANT
NEEDLE HYPO 25X1 1.5 SAFETY (NEEDLE) ×1 IMPLANT
NS IRRIG 1000ML POUR BTL (IV SOLUTION) ×1 IMPLANT
PACK BASIN DAY SURGERY FS (CUSTOM PROCEDURE TRAY) ×1 IMPLANT
PENCIL SMOKE EVACUATOR (MISCELLANEOUS) ×1 IMPLANT
SLEEVE SCD COMPRESS KNEE MED (STOCKING) ×1 IMPLANT
SPONGE T-LAP 4X18 ~~LOC~~+RFID (SPONGE) ×1 IMPLANT
STRIP CLOSURE SKIN 1/2X4 (GAUZE/BANDAGES/DRESSINGS) ×1 IMPLANT
SUT MON AB 4-0 PC3 18 (SUTURE) ×1 IMPLANT
SUT SILK 2 0 SH (SUTURE) IMPLANT
SUT VIC AB 3-0 SH 27X BRD (SUTURE) ×1 IMPLANT
SYR CONTROL 10ML LL (SYRINGE) ×1 IMPLANT
TOWEL GREEN STERILE FF (TOWEL DISPOSABLE) ×1 IMPLANT
TRAY FAXITRON CT DISP (TRAY / TRAY PROCEDURE) ×1 IMPLANT
TUBE CONNECTING 20X1/4 (TUBING) ×1 IMPLANT
YANKAUER SUCT BULB TIP NO VENT (SUCTIONS) ×1 IMPLANT

## 2023-05-23 NOTE — Discharge Instructions (Addendum)
 [facility] Office Phone Number [phone]  BREAST BIOPSY/ PARTIAL MASTECTOMY: POST OP INSTRUCTIONS  Always review your discharge instruction sheet given to you by the facility where your surgery was performed.  IF YOU HAVE DISABILITY OR FAMILY LEAVE FORMS, YOU MUST BRING THEM TO THE OFFICE FOR PROCESSING.  DO NOT GIVE THEM TO YOUR DOCTOR.  A prescription for pain medication may be given to you upon discharge.  Take your pain medication as prescribed, if needed.  If narcotic pain medicine is not needed, then you may take acetaminophen (Tylenol) or ibuprofen (Advil) as needed. Take your usually prescribed medications unless otherwise directed If you need a refill on your pain medication, please contact your pharmacy.  They will contact our office to request authorization.  Prescriptions will not be filled after 5pm or on week-ends. You should eat very light the first 24 hours after surgery, such as soup, crackers, pudding, etc.  Resume your normal diet the day after surgery. Most patients will experience some swelling and bruising in the breast.  Ice packs and a good support bra will help.  Swelling and bruising can take several days to resolve.  It is common to experience some constipation if taking pain medication after surgery.  Increasing fluid intake and taking a stool softener will usually help or prevent this problem from occurring.  A mild laxative (Milk of Magnesia or Miralax) should be taken according to package directions if there are no bowel movements after 48 hours. Unless discharge instructions indicate otherwise, you may remove your bandages 24-48 hours after surgery, and you may shower at that time.  You may have steri-strips (small skin tapes) in place directly over the incision.  These strips should be left on the skin for 7-10 days.  If your surgeon used skin glue on the incision, you may shower in 24 hours.  The glue will flake off over the next 2-3 weeks.  Any  sutures or staples will be removed at the office during your follow-up visit. ACTIVITIES:  You may resume regular daily activities (gradually increasing) beginning the next day.  Wearing a good support bra or sports bra minimizes pain and swelling.  You may have sexual intercourse when it is comfortable. You may drive when you no longer are taking prescription pain medication, you can comfortably wear a seatbelt, and you can safely maneuver your car and apply brakes. RETURN TO WORK:  ______________________________________________________________________________________ Bonita Quin should see your doctor in the office for a follow-up appointment approximately two weeks after your surgery.  Your doctor's nurse will typically make your follow-up appointment when she calls you with your pathology report.  Expect your pathology report 2-3 business days after your surgery.  You may call to check if you do not hear from [place] after three days. OTHER INSTRUCTIONS: _______________________________________________________________________________________________ _____________________________________________________________________________________________________________________________________ _____________________________________________________________________________________________________________________________________ _____________________________________________________________________________________________________________________________________  WHEN TO CALL YOUR DOCTOR: Fever over 101.0 Nausea and/or vomiting. Extreme swelling or bruising. Continued bleeding from incision. Increased pain, redness, or drainage from the incision.  The clinic staff is available to answer your questions during regular business hours.  Please don't hesitate to call and ask to speak to one of the nurses for clinical concerns.  If you have a medical emergency, go to the nearest emergency room or call 911.  A surgeon from [facility] is always on call at the hospital.  For further questions, please visit [email or web address]   Post Anesthesia Home Care Instructions  Activity: Get plenty of rest for the remainder of the  day. A responsible individual must stay with you for 24 hours following the procedure.  For the next 24 hours, DO NOT: -Drive a car -Advertising copywriter -Drink alcoholic beverages -Take any medication unless instructed by your physician -Make any legal decisions or sign important papers.  Meals: Start with liquid foods such as gelatin or soup. Progress to regular foods as tolerated. Avoid greasy, spicy, heavy foods. If nausea and/or vomiting occur, drink only clear liquids until the nausea and/or vomiting subsides. Call your physician if vomiting continues.  Special Instructions/Symptoms: Your throat may feel dry or sore from the anesthesia or the breathing tube placed in your throat during surgery. If this causes discomfort, gargle with warm salt water. The discomfort should disappear within 24 hours.  If you had a scopolamine patch placed behind your ear for the management of post- operative nausea and/or vomiting:  1. The medication in the patch is effective for 72 hours, after which it should be removed.  Wrap patch in a tissue and discard in the trash. Wash hands thoroughly with soap and water. 2. You may remove the patch earlier than 72 hours if you experience unpleasant side effects which may include dry mouth, dizziness or visual disturbances. 3. Avoid touching the patch. Wash your hands with soap and water after contact with the patch.     Next dose of Tylenol may be given at 3:00pm if needed. Next dose of NSAIDs (Ibuprofen/Motrin/Aleve) may be given 5:00pm if needed.

## 2023-05-23 NOTE — Anesthesia Preprocedure Evaluation (Addendum)
 Anesthesia Evaluation  Patient identified by MRN, date of birth, ID band Patient awake    Reviewed: Allergy & Precautions, NPO status , Patient's Chart, lab work & pertinent test results  History of Anesthesia Complications (+) history of anesthetic complications (episode of awareness during intubation for lap chole)  Airway Mallampati: II  TM Distance: >3 FB Neck ROM: Full    Dental  (+) Dental Advisory Given, Teeth Intact   Pulmonary PE   Pulmonary exam normal        Cardiovascular + Peripheral Vascular Disease  Normal cardiovascular exam+ dysrhythmias Atrial Fibrillation    '22 TTE - EF 60 to 65%. Left atrial size was mildly dilated. No significant valvulopathy    Neuro/Psych negative neurological ROS  negative psych ROS   GI/Hepatic Neg liver ROS,GERD  Medicated and Controlled,, S/p gastric banding    Endo/Other  diabetes  Class 3 obesity  Renal/GU negative Renal ROS     Musculoskeletal  (+) Arthritis ,    Abdominal  (+) + obese  Peds  Hematology  (+) Blood dyscrasia, anemia  On xarelto    Anesthesia Other Findings   Reproductive/Obstetrics                             Anesthesia Physical Anesthesia Plan  ASA: 3  Anesthesia Plan: General   Post-op Pain Management: Tylenol PO (pre-op) and Celebrex PO (pre-op)   Induction: Intravenous  PONV Risk Score and Plan: 4 or greater and Treatment may vary due to age or medical condition, Ondansetron and TIVA  Airway Management Planned: LMA  Additional Equipment: None  Intra-op Plan:   Post-operative Plan: Extubation in OR  Informed Consent: I have reviewed the patients History and Physical, chart, labs and discussed the procedure including the risks, benefits and alternatives for the proposed anesthesia with the patient or authorized representative who has indicated his/her understanding and acceptance.     Dental advisory  given  Plan Discussed with: CRNA and Anesthesiologist  Anesthesia Plan Comments:         Anesthesia Quick Evaluation

## 2023-05-23 NOTE — Transfer of Care (Signed)
 Immediate Anesthesia Transfer of Care Note  Patient: [name]  Procedure(s) Performed: EXCISION, MASS, BREAST, USING RADIOLOGICAL MARKER (Right: Breast)  Patient Location: PACU  Anesthesia Type:General  Level of Consciousness: awake, alert , and oriented  Airway & Oxygen Therapy: Patient Spontanous Breathing and Patient connected to face mask oxygen  Post-op Assessment: Report given to RN and Post -op Vital signs reviewed and stable  Post vital signs: Reviewed and stable  Last Vitals:  Vitals Value Taken Time  BP 121/69 05/23/23 1116  Temp    Pulse 69 05/23/23 1118  Resp 21 05/23/23 1118  SpO2 91 % 05/23/23 1118  Vitals shown include unfiled device data.  Last Pain:  Vitals:   05/23/23 0858  TempSrc: Temporal  PainSc: 0-No pain      Patients Stated Pain Goal: 3 (05/23/23 0858)  Complications: No notable events documented.

## 2023-05-23 NOTE — Anesthesia Postprocedure Evaluation (Signed)
 Anesthesia Post Note  Patient: [name]  Procedure(s) Performed: EXCISION, MASS, BREAST, USING RADIOLOGICAL MARKER (Right: Breast)     Patient location during evaluation: PACU Anesthesia Type: General Level of consciousness: awake and alert Pain management: pain level controlled Vital Signs Assessment: post-procedure vital signs reviewed and stable Respiratory status: spontaneous breathing, nonlabored ventilation, respiratory function stable and patient connected to nasal cannula oxygen Cardiovascular status: blood pressure returned to baseline and stable Postop Assessment: no apparent nausea or vomiting Anesthetic complications: no   No notable events documented.  Last Vitals:  Vitals:   05/23/23 1154 05/23/23 1214  BP: 130/69 130/66  Pulse: (!) 57 60  Resp: 16 16  Temp:  (!) 36.3 C  SpO2: 95% 98%    Last Pain:  Vitals:   05/23/23 1214  TempSrc:   PainSc: 3                  Naftuli Dalsanto

## 2023-05-23 NOTE — Anesthesia Procedure Notes (Signed)
 Procedure Name: LMA Insertion Date/Time: 05/23/2023 10:34 AM  Performed by: Steffani Edman, CRNAPre-anesthesia Checklist: Patient identified, Emergency Drugs available, Suction available and Patient being monitored Patient Re-evaluated:Patient Re-evaluated prior to induction Oxygen Delivery Method: Circle System Utilized Preoxygenation: Pre-oxygenation with 100% oxygen Induction Type: IV induction Ventilation: Mask ventilation without difficulty LMA: LMA inserted LMA Size: 4.0 Number of attempts: 1 Placement Confirmation: positive ETCO2 Tube secured with: Tape Dental Injury: Teeth and Oropharynx as per pre-operative assessment

## 2023-05-23 NOTE — Op Note (Signed)
 Pre-op Diagnosis: Normal, right nipple discharge Post-op Diagnosis: same Procedure: Right breast radioactive seed localized lumpectomy Surgeon:  Roque Schill K. Anesthesia:  GEN - LMA Indications:  This is a 63 year old female with no family history of breast cancer who presents with a 28-month history of some clear right nipple discharge. She underwent a mammogram that revealed a mass in the right breast located at 9:00, 1 cm from the nipple. There is a mass in the duct measuring 4 mm in diameter. The axilla was negative. Biopsy was obtained that revealed benign intraductal papilloma. She continues to have nipple discharge.   A radioactive seed was placed yesterday by radiology.  Description of procedure: The patient is brought to the operating room placed in supine position on the operating room table. After an adequate level of general anesthesia was obtained, her right breast was prepped with ChloraPrep and draped in sterile fashion. A timeout was taken to ensure the proper patient and proper procedure. We interrogated the breast with the neoprobe. We made a circumareolar incision around the lateral side of the nipple after infiltrating with 0.25% Marcaine. Dissection was carried down in the breast tissue with cautery. We used the neoprobe to guide us  towards the radioactive seed in the retroareolar space. We excised an area of tissue around the radioactive seed 1.5 cm in diameter. The specimen was removed and was oriented with a paint kit. Specimen mammogram showed the radioactive seed as well as the biopsy clip within the specimen. This was sent for pathologic examination. There is no residual radioactivity within the biopsy cavity. We inspected carefully for hemostasis. The wound was thoroughly irrigated. The wound was closed with a deep layer of 3-0 Vicryl and a subcuticular layer of 4-0 Monocryl. Benzoin Steri-Strips were applied. The patient was then extubated and brought to the recovery room in  stable condition. All sponge, instrument, and needle counts are correct.  Kari Otto. Eli Grizzle, MD, [facility]  General/ Trauma Surgery  05/23/2023 11:11 AM

## 2023-05-23 NOTE — Interval H&P Note (Signed)
 History and Physical Interval Note:  05/23/2023 10:03 AM  [name]  has presented today for surgery, with the diagnosis of RIGHT BREAST INTRADUCTAL PAPILLOMA.  The various methods of treatment have been discussed with the patient and family. After consideration of risks, benefits and other options for treatment, the patient has consented to  Procedure(s) with comments: EXCISION, MASS, BREAST, USING RADIOLOGICAL MARKER (Right) - LMA as a surgical intervention.  The patient's history has been reviewed, patient examined, no change in status, stable for surgery.  I have reviewed the patient's chart and labs.  Questions were answered to the patient's satisfaction.     [name]

## 2023-05-24 ENCOUNTER — Encounter (HOSPITAL_BASED_OUTPATIENT_CLINIC_OR_DEPARTMENT_OTHER): Payer: Self-pay | Admitting: Surgery

## 2023-05-24 LAB — SURGICAL PATHOLOGY

## 2023-05-31 ENCOUNTER — Encounter (HOSPITAL_COMMUNITY): Payer: Self-pay

## 2023-05-31 ENCOUNTER — Ambulatory Visit (HOSPITAL_COMMUNITY): Admit: 2023-05-31 | Payer: No Typology Code available for payment source | Admitting: Gastroenterology

## 2023-05-31 SURGERY — COLONOSCOPY WITH PROPOFOL
Anesthesia: Monitor Anesthesia Care

## 2023-06-19 ENCOUNTER — Encounter (HOSPITAL_COMMUNITY): Payer: Self-pay | Admitting: Physician Assistant

## 2023-06-19 ENCOUNTER — Other Ambulatory Visit (HOSPITAL_COMMUNITY): Payer: Self-pay | Admitting: Physician Assistant

## 2023-06-19 ENCOUNTER — Ambulatory Visit (HOSPITAL_COMMUNITY)
Admission: RE | Admit: 2023-06-19 | Discharge: 2023-06-19 | Disposition: A | Discharge status: Home / Self Care | Payer: No Typology Code available for payment source | Source: Ambulatory Visit | Attending: Physician Assistant | Admitting: Physician Assistant

## 2023-06-19 VITALS — BP 132/78 | HR 65 | Ht 66.0 in | Wt 311.0 lb

## 2023-06-19 DIAGNOSIS — Z6841 Body Mass Index (BMI) 40.0 and over, adult: Secondary | ICD-10-CM

## 2023-06-19 DIAGNOSIS — E66813 Obesity, class 3: Secondary | ICD-10-CM | POA: Diagnosis not present

## 2023-06-19 DIAGNOSIS — I480 Paroxysmal atrial fibrillation: Secondary | ICD-10-CM | POA: Diagnosis not present

## 2023-06-19 MED ORDER — MULTAQ 400 MG PO TABS: 400.0000 mg | ORAL_TABLET | Freq: Two times a day (BID) | ORAL | 3 refills | Status: DC

## 2023-06-19 NOTE — Telephone Encounter (Signed)
 Preauth started on cover my meds

## 2023-06-19 NOTE — Patient Instructions (Signed)
Start Multaq 400mg twice a day WITH FOOD 

## 2023-06-19 NOTE — Progress Notes (Addendum)
 Primary Care Physician: Beecher Bower, MD Primary Cardiologist: Ahmad Alert, MD Electrophysiologist: None  Referring Physician: Dr Waymond Hailey is a 63 y.o. female with a history of DVT/PE, HLD, atrial fibrillation who presents for follow up in the [facility].  The patient was initially diagnosed with atrial fibrillation in 2017. Patient is on Xarelto  for stroke prevention and DVT/PE. She was seen by Dr Alroy Aspen 02/13/23 and was having more frequent palpitations. She was started on PRN propranolol . She was started on flecainide  but had increased heart racing after each dose, felt to be proarrhythmic and discontinued. Her tachypalpitations improved.   Patient returns for follow up for atrial fibrillation. She reports a recent increase in her tachypalpitations, now occurring almost daily. She is in SR today, no bleeding issues on anticoagulation.   Today, she  denies symptoms of chest pain, shortness of breath, orthopnea, PND, lower extremity edema, dizziness, presyncope, syncope, snoring, daytime somnolence, bleeding, or neurologic sequela. The patient is tolerating medications without difficulties and is otherwise without complaint today.    Atrial Fibrillation Risk Factors:  she does not have symptoms or diagnosis of sleep apnea. Negative sleep study. she does not have a history of rheumatic fever. she does not have a history of alcohol use. The patient does not have a history of early familial atrial fibrillation or other arrhythmias.  Atrial Fibrillation Management history:  Previous antiarrhythmic drugs: flecainide   Previous cardioversions: none Previous ablations: none Anticoagulation history: Xarelto    ROS- All systems are reviewed and negative except as per the HPI above.  Past Medical History:  Diagnosis Date   Anemia    Arthritis    Asthma 06/01/2021   NO RECENT FLARE UP   Atrial fibrillation (HCC)    Bleeding ulcer 12-2010   hospitalized for 1 week   Blood transfusion    Blood transfusion without reported diagnosis    2011-2 units transfused, bleeding ulcer   Bruises easily    CHF (congestive heart failure) (HCC)    with PE resolved after    Complication of anesthesia    paralitic hit before propofol    Constipation    Difficulty in swallowing 03/24/2013   no longer a problem   Diverticulitis    DVT (deep venous thrombosis) (HCC) 06/2013   right leg   Dysrhythmia     Hx of A fib    GERD (gastroesophageal reflux disease)    Hyperlipidemia    Intraductal papilloma of breast, right    Leg swelling 03/24/2013   not a problem any longer   Osteoarthritis    Peripheral vascular disease (HCC)    Pre-diabetes    Pulmonary embolus (HCC) 06/2013   Reflux    Shortness of breath    with PE   Vomiting 03/24/2013   problem resolved after lap band revision    Current Outpatient Medications  Medication Sig Dispense Refill   acetaminophen  (TYLENOL ) 325 MG tablet Take 2 tablets (650 mg total) by mouth every 6 (six) hours as needed for mild pain (or Fever >/= 101).     celecoxib  (CELEBREX ) 200 MG capsule TAKE 1 CAPSULE BY MOUTH EVERY DAY 30 capsule 7   metoprolol  succinate (TOPROL -XL) 50 MG 24 hr tablet TAKE 1 TABLET BY MOUTH EVERY DAY WITH OR IMMEDIATELY FOLLOWING A MEAL 90 tablet 3   metoprolol  tartrate (LOPRESSOR ) 25 MG tablet TAKE 1 TABLET BY MOUTH UP TO TWICE DAILY AS NEEDED FOR PALPITATIONS (IN ADDITION TO SUCCINATE) 180  tablet 3   MOUNJARO 2.5 MG/0.5ML Pen Inject 2.5 mg into the skin once a week.     Multiple Vitamin (MULTIVITAMIN) tablet Take 1 tablet by mouth daily.     ondansetron  (ZOFRAN -ODT) 4 MG disintegrating tablet TAKE 1 TABLET BY MOUTH EVERY 8 HOURS AS NEEDED FOR NAUSEA AND VOMITING 20 tablet 3   pantoprazole  (PROTONIX ) 40 MG tablet Take 40 mg by mouth every morning.     polyethylene glycol (MIRALAX  / GLYCOLAX ) 17 g packet Take 17 g by mouth 2 (two) times daily. 14 each 0   propranolol  (INDERAL )  20 MG tablet TAKE 1 TABLET (20 MG TOTAL) BY MOUTH 4 (FOUR) TIMES DAILY AS NEEDED (FOR PALPITATIONS OR HEART RATE GREATER THAN 110). 360 tablet 4   sulfamethoxazole -trimethoprim  (BACTRIM  DS) 800-160 MG tablet Take 1 tablet by mouth 2 (two) times daily. 60 tablet 5   tretinoin (RETIN-A) 0.05 % cream Apply topically daily.     XARELTO  20 MG TABS tablet Take 20 mg by mouth daily.     No current facility-administered medications for this encounter.    Physical Exam: BP 132/78   Pulse 65   Ht 5\' 6"  (1.676 m)   Wt (!) 141.1 kg   BMI 50.20 kg/m   GEN: Well nourished, well developed in no acute distress CARDIAC: Regular rate and rhythm, no murmurs, rubs, gallops RESPIRATORY:  Clear to auscultation without rales, wheezing or rhonchi  ABDOMEN: Soft, non-tender, non-distended EXTREMITIES:  No edema; No deformity    Wt Readings from Last 3 Encounters:  06/19/23 (!) 141.1 kg  05/23/23 (!) 140.8 kg  03/11/23 (!) 147.9 kg     EKG today demonstrates  SR, inc RBBB Vent. rate 65 BPM PR interval 160 ms QRS duration 112 ms QT/QTcB 452/470 ms   Echo 01/04/21 demonstrated   1. Left ventricular ejection fraction, by estimation, is 60 to 65%. The  left ventricle has normal function. The left ventricle has no regional  wall motion abnormalities. Left ventricular diastolic parameters were  normal.   2. Right ventricular systolic function is normal. The right ventricular  size is normal. There is normal pulmonary artery systolic pressure.   3. Left atrial size was mildly dilated.   4. The mitral valve is normal in structure. No evidence of mitral valve  regurgitation. No evidence of mitral stenosis.   5. The aortic valve is tricuspid. Aortic valve regurgitation is not  visualized. Aortic valve sclerosis is present, with no evidence of aortic  valve stenosis.   6. The inferior vena cava is normal in size with greater than 50%  respiratory variability, suggesting right atrial pressure of 3 mmHg.    Comparison(s): A prior study was performed on 07/21/2018.    CHA2DS2-VASc Score = 2  The patient's score is based upon: CHF History: 0 HTN History: 0 Diabetes History: 1 Stroke History: 0 Vascular Disease History: 0 Age Score: 0 Gender Score: 1       ASSESSMENT AND PLAN: Paroxysmal Atrial Fibrillation (ICD10:  I48.0) The patient's CHA2DS2-VASc score is 2, indicating a 2.2% annual risk of stroke.   Previously failed flecainide  She is in SR today but having more frequent tachypalpitations. She is likely not a candidate for afib ablation with elevated BMI. 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 Could consider Multaq or amiodarone. Would prefer to avoid amiodarone due to her age.  Will start Multaq 400 mg BID Continue Xarelto  20 mg daily (on lifelong with  unprovoked PE) Continue Toprol  50 mg daily Continue propranolol  20 mg PRN q 6 hours for heart racing  Obesity Body mass index is 50.2 kg/m.  S/p lab band surgery. She is currently on Mounjaro. We briefly discussed visit with bariatric surgery to see if she would be a candidate for any other procedures.  Encouraged lifestyle modification.     Follow up in the AF clinic next week for ECG. Will also refer to EP to establish care.       Myrtha Ates PA-C [facility] [address] [place], [place] [address] [phone]

## 2023-06-21 ENCOUNTER — Telehealth (HOSPITAL_COMMUNITY): Payer: Self-pay | Admitting: *Deleted

## 2023-06-21 NOTE — Telephone Encounter (Signed)
 We're pleased to let you know that we've approved your or your doctor's request for coverage for Multaq 400mg  tablet. You can now fill your prescription, and it will be covered according to your plan. As long as you remain covered by your prescription drug plan and there are no changes to your plan benefits, this request is approved from 06/19/2023 to 06/18/2024. When this approval expires, please speak to your doctor about your treatment.

## 2023-06-28 ENCOUNTER — Ambulatory Visit (HOSPITAL_COMMUNITY)
Admission: RE | Admit: 2023-06-28 | Discharge: 2023-06-28 | Disposition: A | Discharge status: Home / Self Care | Source: Ambulatory Visit | Attending: Physician Assistant | Admitting: Physician Assistant

## 2023-06-28 DIAGNOSIS — I480 Paroxysmal atrial fibrillation: Secondary | ICD-10-CM | POA: Diagnosis not present

## 2023-06-28 NOTE — Progress Notes (Signed)
 Patient returns for ECG after starting Multaq . ECG shows:  SR, 1st degree AV block Vent. rate 59 BPM PR interval 214 ms QRS duration 104 ms QT/QTcB 442/437 ms  Patient reports she has only had one episode of afib since starting Multaq  which is a substantial improvement. No side effects. Follow up with Dr Lawana Pray as scheduled.

## 2023-07-15 ENCOUNTER — Encounter: Payer: Self-pay | Admitting: Cardiovascular Disease

## 2023-07-16 ENCOUNTER — Other Ambulatory Visit (HOSPITAL_COMMUNITY): Payer: Self-pay | Admitting: *Deleted

## 2023-08-01 ENCOUNTER — Encounter (HOSPITAL_BASED_OUTPATIENT_CLINIC_OR_DEPARTMENT_OTHER): Payer: Self-pay | Admitting: Surgery

## 2023-08-01 NOTE — OR Nursing (Signed)
 Late entry:  Due to a error with the procedure names and radioactive seeds, this procedure was documented incorrectly the day of surgery.  I reviewed the operative note dictated by the surgeon and corrected the OR record to match the completed procedure.  Berwyn Eagles, RN.

## 2023-09-01 NOTE — Progress Notes (Unsigned)
 Electrophysiology Office Note:   Date:  09/02/2023  ID:  [name], DOB 1960-08-20, MRN [id number]  Primary Cardiologist: Aleene Passe, MD (Inactive) Primary Heart Failure: None Electrophysiologist: Gerrica Cygan Gladis Norton, MD      History of Present Illness:   [name] is a 63 y.o. female with h/o DVT/PE, hyperlipidemia, atrial fibrillation seen today for  for Electrophysiology evaluation of atrial fibrillation at the request of [name].    She was diagnosed with atrial fibrillation in 2017.  She was seen January 2025 having more frequent palpitations.  She was started on flecainide  and Multaq  which did not control her symptoms.  Over the last few weeks, she has done well.  She has had minimal episodes of atrial fibrillation.  When she does have atrial fibrillation, she takes a propranolol  which improves her symptoms.  If she has more prolonged episodes, she takes an extra metoprolol .  She is feeling well today and is happy with her control.  Today, denies symptoms of palpitations, chest pain, dyspnea, orthopnea, PND, lower extremity edema, claudication, dizziness, presyncope, syncope, bleeding, or neurologic sequela. The patient is tolerating medications without difficulties.    Review of systems complete and found to be negative unless listed in HPI.   EP Information / Studies Reviewed:    EKG is ordered today. Personal review as below.  EKG Interpretation Date/Time:  Monday September 02 2023 09:19:30 EDT Ventricular Rate:  70 PR Interval:  176 QRS Duration:  94 QT Interval:  414 QTC Calculation: 447 R Axis:   -3  Text Interpretation: Normal sinus rhythm Low voltage QRS Incomplete right bundle branch block Septal infarct (cited on or before 19-Jun-2023) Inferior infarct (cited on or before 19-Jun-2023) Abnormal ECG When compared with ECG of 28-Jun-2023 11:21, No significant change since last tracing Confirmed by Raima Geathers ([id number]) on 09/02/2023 9:26:26 AM   Risk  Assessment/Calculations:    CHA2DS2-VASc Score = 2   This indicates a 2.2% annual risk of stroke. The patient's score is based upon: CHF History: 0 HTN History: 0 Diabetes History: 1 Stroke History: 0 Vascular Disease History: 0 Age Score: 0 Gender Score: 1             Physical Exam:   VS:  BP 122/70   Pulse 70   Ht 5' 6 (1.676 m)   Wt (!) 306 lb (138.8 kg)   SpO2 97%   BMI 49.39 kg/m    Wt Readings from Last 3 Encounters:  09/02/23 (!) 306 lb (138.8 kg)  06/19/23 (!) 311 lb (141.1 kg)  05/23/23 (!) 310 lb 6.5 oz (140.8 kg)     GEN: Well nourished, well developed in no acute distress NECK: No JVD; No carotid bruits CARDIAC: Regular rate and rhythm, no murmurs, rubs, gallops RESPIRATORY:  Clear to auscultation without rales, wheezing or rhonchi  ABDOMEN: Soft, non-tender, non-distended EXTREMITIES:  No edema; No deformity   ASSESSMENT AND PLAN:    1.  Paroxysmal atrial fibrillation: Has previously failed flecainide .  Now on metoprolol , as needed propranolol .  Not a candidate for ablation due to BMI.  If she needed an antiarrhythmic, amiodarone would be her only option.  She is motivated to lose weight, and is only limited by her knee issues.  She is on chronic Bactrim  for infected prosthesis.  She is in sinus rhythm and has rare episodes of atrial fibrillation.  [name] continue with current management.  2.  Obesity: Lifestyle modification encouraged.  Is post post lap band surgery and  is on Mounjaro.  She is interested in redo surgery.  3.  Secondary hypercoagulable state: On Xarelto  for atrial fibrillation and PE  Follow up with Afib Clinic in 6 months  Signed, Jakson Delpilar Gladis Norton, MD

## 2023-09-02 ENCOUNTER — Encounter: Payer: Self-pay | Admitting: Cardiology

## 2023-09-02 ENCOUNTER — Ambulatory Visit: Payer: Self-pay | Attending: Cardiology | Admitting: Cardiology

## 2023-09-02 VITALS — BP 122/70 | HR 70 | Ht 66.0 in | Wt 306.0 lb

## 2023-09-02 DIAGNOSIS — I480 Paroxysmal atrial fibrillation: Secondary | ICD-10-CM

## 2023-09-02 DIAGNOSIS — D6869 Other thrombophilia: Secondary | ICD-10-CM | POA: Diagnosis not present

## 2023-09-16 ENCOUNTER — Telehealth: Payer: Self-pay

## 2023-09-16 NOTE — Telephone Encounter (Signed)
   Pre-operative Risk Assessment    Patient Name: [name]  DOB: [birth year] MRN: [id number]   Date of last office visit: 09/02/23 WILL CAMNITZ, MD Date of next office visit: NONE   Request for Surgical Clearance    Procedure:  BARIATRIC SURGERY  Date of Surgery:  Clearance TBD                                Surgeon:  ROCK HERRLICH, MD Surgeon's Group or Practice Name:  [facility] Phone number:  [phone] Fax number:  [phone]   Type of Clearance Requested:   - Medical    Type of Anesthesia:  General    Additional requests/questions:    SignedLucie DELENA Ku   09/16/2023, 1:03 PM

## 2023-09-18 ENCOUNTER — Telehealth: Payer: Self-pay

## 2023-09-18 NOTE — Telephone Encounter (Signed)
 Pharmacy please advise on holding Xarelto  prior to EGD scheduled for 10/16/2023. Last labs 06/28/2023 (CBC,BMET). Thank you.

## 2023-09-18 NOTE — Telephone Encounter (Signed)
 PT HAS A CLEARANCE FOR ANOTHER PROCEDURE ENTERED ON 09/16/23     Pre-operative Risk Assessment    Patient Name: [name]  DOB: Jun 22, 1960 MRN: [id number]   Date of last office visit: 09/02/23 WILL CAMNITZ, MD Date of next office visit: NONE   Request for Surgical Clearance    Procedure:  EGD  Date of Surgery:  Clearance 10/16/23                                Surgeon:  ROCK HERRLICH, MD Surgeon's Group or Practice Name:  [facility] Phone number:  [phone] Fax number:  [phone]  ATTN: [name]   Type of Clearance Requested:   - Medical  - Pharmacy:  Hold Rivaroxaban  (Xarelto )     Type of Anesthesia:  MAC   Additional requests/questions:    Signed, [name]   09/18/2023, 2:29 PM

## 2023-09-23 NOTE — Telephone Encounter (Signed)
 Patient with diagnosis of afib and DVT/PE on Xarelto  for anticoagulation.    Procedure:  BARIATRIC SURGERY  Date of procedure: TBD   CHA2DS2-VASc Score = 3   This indicates a 3.2% annual risk of stroke. The patient's score is based upon: CHF History: 0 HTN History: 0 Diabetes History: 1 Stroke History: 0 Vascular Disease History: 1 Age Score: 0 Gender Score: 1      CrCl 100 ml/min Platelet count 181  Patient has not had an Afib/aflutter ablation within the last 3 months or DCCV within the last 30 days  Per office protocol, patient can hold Xarelto  for 2 days prior to procedure.    **This guidance is not considered finalized until pre-operative APP has relayed final recommendations.**

## 2023-09-23 NOTE — Telephone Encounter (Signed)
 Patient with diagnosis of afib/DVT/PE on Xarelto  for anticoagulation.    Procedure: EGD Date of procedure: 10/16/23   CHA2DS2-VASc Score = 3   This indicates a 3.2% annual risk of stroke. The patient's score is based upon: CHF History: 0 HTN History: 0 Diabetes History: 1 Stroke History: 0 Vascular Disease History: 1 Age Score: 0 Gender Score: 1      CrCl 100 ml/min Platelet count 181  Patient has not had an Afib/aflutter ablation within the last 3 months or DCCV within the last 30 days  Per office protocol, patient can hold Xarelto  for 2 days prior to procedure.    **This guidance is not considered finalized until pre-operative APP has relayed final recommendations.**

## 2023-09-25 ENCOUNTER — Telehealth: Payer: Self-pay | Admitting: *Deleted

## 2023-09-25 NOTE — Telephone Encounter (Signed)
   Patient Name: [name]  DOB: 11-17-1960 MRN: [id number]  Primary Cardiologist: Aleene Passe, MD (Inactive)  Chart reviewed as part of pre-operative protocol coverage. Given past medical history and time since last visit, based on ACC/AHA guidelines, [name] is at acceptable risk for the planned procedure without further cardiovascular testing.   Per [facility] 09/25/2023 Patient at intermediate risk for an intermediate risk procedure, bariatric surgery. Echo normal in 2022. No ischemic symptoms. No further cardiac testing is necessary. Anticoagulation per pharmacy protocol.   Per Pharmacy: 09/23/2023 Patient has not had an Afib/aflutter ablation within the last 3 months or DCCV within the last 30 days   Per office protocol, patient can hold Xarelto  for 2 days prior to procedure.    The patient was advised that if she develops new symptoms prior to surgery to contact our office to arrange for a follow-up visit, and she verbalized understanding.  I will route this recommendation to the requesting party via Epic fax function and remove from pre-op pool.  Please call with questions.  Lamarr Satterfield, NP 09/25/2023, 1:19 PM

## 2023-09-25 NOTE — Telephone Encounter (Signed)
 Pt has been scheduled tele preop appt 10/04/23. Med rec and consent are done.     Patient Consent for Virtual Visit        [name] has provided verbal consent on 09/25/2023 for a virtual visit (video or telephone).   CONSENT FOR VIRTUAL VISIT FOR:  [name]  By participating in this virtual visit I agree to the following:  I hereby voluntarily request, consent and authorize [facility] and its employed or contracted physicians, physician assistants, nurse practitioners or other licensed health care professionals (the Practitioner), to provide me with telemedicine health care services (the "Services) as deemed necessary by the treating Practitioner. I acknowledge and consent to receive the Services by the Practitioner via telemedicine. I understand that the telemedicine visit will involve communicating with the Practitioner through live audiovisual communication technology and the disclosure of certain medical information by electronic transmission. I acknowledge that I have been given the opportunity to request an in-person assessment or other available alternative prior to the telemedicine visit and am voluntarily participating in the telemedicine visit.  I understand that I have the right to withhold or withdraw my consent to the use of telemedicine in the course of my care at any time, without affecting my right to future care or treatment, and that the Practitioner or I may terminate the telemedicine visit at any time. I understand that I have the right to inspect all information obtained and/or recorded in the course of the telemedicine visit and may receive copies of available information for a reasonable fee.  I understand that some of the potential risks of receiving the Services via telemedicine include:  Delay or interruption in medical evaluation due to technological equipment failure or disruption; Information transmitted may not be sufficient (e.g. poor  resolution of images) to allow for appropriate medical decision making by the Practitioner; and/or  In rare instances, security protocols could fail, causing a breach of personal health information.  Furthermore, I acknowledge that it is my responsibility to provide information about my medical history, conditions and care that is complete and accurate to the best of my ability. I acknowledge that Practitioner's advice, recommendations, and/or decision may be based on factors not within their control, such as incomplete or inaccurate data provided by me or distortions of diagnostic images or specimens that may result from electronic transmissions. I understand that the practice of medicine is not an exact science and that Practitioner makes no warranties or guarantees regarding treatment outcomes. I acknowledge that a copy of this consent can be made available to me via my patient portal [facility] MyChart), or I can request a printed copy by calling the office of [facility].    I understand that my insurance will be billed for this visit.   I have read or had this consent read to me. I understand the contents of this consent, which adequately explains the benefits and risks of the Services being provided via telemedicine.  I have been provided ample opportunity to ask questions regarding this consent and the Services and have had my questions answered to my satisfaction. I give my informed consent for the services to be provided through the use of telemedicine in my medical care

## 2023-09-25 NOTE — Telephone Encounter (Signed)
   Name: [name]  DOB: 01-Dec-1960  MRN: [id number]  Primary Cardiologist: Aleene Passe, MD (Inactive)   Preoperative team, please contact this patient and set up a phone call appointment for further preoperative risk assessment. Please obtain consent and complete medication review. Thank you for your help. Not been seen by general cardiology. Only seen by Afib clinic for the last few months.   I confirm that guidance regarding antiplatelet and oral anticoagulation therapy has been completed and, if necessary, noted below.  Patient has not had an Afib/aflutter ablation within the last 3 months or DCCV within the last 30 days   Per office protocol, patient can hold Xarelto  for 2 days prior to procedure.    I also confirmed the patient resides in the state of [place] . As per [facility] Medical Board telemedicine laws, the patient must reside in the state in which the provider is licensed.   Lamarr Satterfield, NP 09/25/2023, 7:34 AM [facility]

## 2023-09-25 NOTE — Telephone Encounter (Signed)
 Pt has been scheduled tele preop appt 10/04/23. Med rec and consent are done.

## 2023-10-03 NOTE — Progress Notes (Unsigned)
 Virtual Visit via Telephone Note   Because of [name] co-morbid illnesses, she is at least at moderate risk for complications without adequate follow up.  This format is felt to be most appropriate for this patient at this time.  Due to technical limitations with video connection (technology), today's appointment will be conducted as an audio only telehealth visit, and [name] verbally agreed to proceed in this manner.   All issues noted in this document were discussed and addressed.  No physical exam could be performed with this format.  Evaluation Performed:  Preoperative cardiovascular risk assessment _____________   Date:  10/03/2023   Patient ID:  [name], DOB 08/12/1960, MRN [id number] Patient Location:  Home Provider location:   Office  Primary Care Provider:  Fernand Tracey LABOR, MD Primary Cardiologist:  Aleene Passe, MD (Inactive)  Chief Complaint / Patient Profile   63 y.o. y/o female with a h/o DVT/PE, HLD, atrial fibrillation  who is pending EGD and presents today for telephonic preoperative cardiovascular risk assessment.  History of Present Illness    [name] is a 63 y.o. female who presents via audio/video conferencing for a telehealth visit today.  Pt was last seen in cardiology clinic on 09/02/2023 by Dr. Inocencio.  At that time [name] was doing well no complaints of palpitations at that time.  She was found to be not a candidate for ablation at that time..  The patient is now pending procedure as outlined above. Since her last visit, she ***  *** denies chest pain, shortness of breath, lower extremity edema, fatigue, palpitations, melena, hematuria, hemoptysis, diaphoresis, weakness, presyncope, syncope, orthopnea, and PND.   Per office protocol, patient can hold Xarelto  for 2 days prior to procedure.    Past Medical History    Past Medical History:  Diagnosis Date   Anemia    Arthritis    Asthma 06/01/2021   NO  RECENT FLARE UP   Atrial fibrillation (HCC)    Bleeding ulcer 12-2010  hospitalized for 1 week   Blood transfusion    Blood transfusion without reported diagnosis    2011-2 units transfused, bleeding ulcer   Bruises easily    CHF (congestive heart failure) (HCC)    with PE resolved after    Complication of anesthesia    paralitic hit before propofol    Constipation    Difficulty in swallowing 03/24/2013   no longer a problem   Diverticulitis    DVT (deep venous thrombosis) (HCC) 06/2013   right leg   Dysrhythmia     Hx of A fib    GERD (gastroesophageal reflux disease)    Hyperlipidemia    Intraductal papilloma of breast, right    Leg swelling 03/24/2013   not a problem any longer   Osteoarthritis    Peripheral vascular disease (HCC)    Pre-diabetes    Pulmonary embolus (HCC) 06/2013   Reflux    Shortness of breath    with PE   Vomiting 03/24/2013   problem resolved after lap band revision   Past Surgical History:  Procedure Laterality Date   APPENDECTOMY     BREAST BIOPSY Right 04/25/2023   US  RT BREAST BX W LOC DEV 1ST LESION IMG BX SPEC US  GUIDE 04/25/2023 [facility]   BREAST BIOPSY  05/22/2023   MM RT RADIOACTIVE SEED LOC MAMMO GUIDE 05/22/2023 [facility]   CARPEL TUNNEL BILATEREAL     CHOLECYSTECTOMY     5'12  COLONOSCOPY N/A 04/10/2013   Procedure: COLONOSCOPY;  Surgeon: Belvie JONETTA Just, MD;  Location: [facility];  Service: Endoscopy;  Laterality: N/A;   DENTAL SURGERY  10/2019   tooth implant   DIAGNOSTIC LAPAROSCOPY     ENDOVENOUS ABLATION SAPHENOUS VEIN W/ LASER  06/2008   ENDOVENOUS ABLATION SAPHENOUS VEIN W/ LASER Left 12/09/2013   EVLA LEFT GREATER SAPHENOUS VEIN BY TODD EARLY MD   ESOPHAGOGASTRODUODENOSCOPY  12/18/2010   Procedure: ESOPHAGOGASTRODUODENOSCOPY (EGD);  Surgeon: Lupita FORBES Commander, MD;  Location: [facility];  Service: Endoscopy;  Laterality: N/A;   I & D KNEE WITH POLY EXCHANGE Right 01/06/2021   Procedure: IRRIGATION AND  DEBRIDEMENT RIGHT KNEE WITH  POLY EXCHANGE;  Surgeon: Vernetta Lonni GRADE, MD;  Location: [facility];  Service: Orthopedics;  Laterality: Right;   JOINT REPLACEMENT Left    knee   LAPAROSCOPIC GASTRIC BANDING  01/05/2008   LAPAROSCOPIC GASTRIC BANDING  12/18/2010   01/04/2009   RADIOACTIVE SEED GUIDED AXILLARY SENTINEL LYMPH NODE Right 05/23/2023   Procedure: RADIOACTIVE SEED GUIDED BREAST BIOPSY;  Surgeon: Belinda Cough, MD;  Location: [facility];  Service: General;  Laterality: Right;  LMA   SALPHINGOOOPHORECTOMY Right    cyst removal   TONSILLECTOMY     TOTAL KNEE ARTHROPLASTY Left 07/31/2016   Procedure: LEFT TOTAL KNEE ARTHROPLASTY;  Surgeon: Vernetta Lonni GRADE, MD;  Location: [facility];  Service: Orthopedics;  Laterality: Left;   TOTAL KNEE ARTHROPLASTY Right 12/23/2020   Procedure: RIGHT TOTAL KNEE ARTHROPLASTY;  Surgeon: Vernetta Lonni GRADE, MD;  Location: [facility];  Service: Orthopedics;  Laterality: Right;   WEDGE RESECTION OF OVARY  LEFT OVARY     Allergies  Allergies  Allergen Reactions   Contrast Media [Iodinated Contrast Media] Shortness Of Breath and Other (See Comments)    APNEA   Penicillin G Other (See Comments)   Ancef  [Cefazolin  Sodium] Nausea And Vomiting    Tolerates IV    Home Medications    Prior to Admission medications   Medication Sig Start Date End Date Taking? Authorizing Provider  acetaminophen  (TYLENOL ) 325 MG tablet Take 2 tablets (650 mg total) by mouth every 6 (six) hours as needed for mild pain (or Fever >/= 101). 01/13/21   Samtani, Jai-Gurmukh, MD  celecoxib  (CELEBREX ) 200 MG capsule TAKE 1 CAPSULE BY MOUTH EVERY DAY 05/15/22   Harden Jerona GAILS, MD  metoprolol  succinate (TOPROL -XL) 50 MG 24 hr tablet TAKE 1 TABLET BY MOUTH EVERY DAY WITH OR IMMEDIATELY FOLLOWING A MEAL 02/18/23   Nahser, Aleene PARAS, MD  metoprolol  tartrate (LOPRESSOR ) 25 MG tablet TAKE 1 TABLET BY MOUTH UP TO TWICE DAILY AS NEEDED FOR PALPITATIONS (IN ADDITION TO  SUCCINATE) 03/19/23   Nahser, Aleene PARAS, MD  MOUNJARO 2.5 MG/0.5ML Pen Inject 2.5 mg into the skin once a week. 03/05/23   [provider]  Multiple Vitamin (MULTIVITAMIN) tablet Take 1 tablet by mouth daily.    [provider]  ondansetron  (ZOFRAN -ODT) 4 MG disintegrating tablet TAKE 1 TABLET BY MOUTH EVERY 8 HOURS AS NEEDED FOR NAUSEA AND VOMITING 07/04/21   Vernetta Lonni GRADE, MD  pantoprazole  (PROTONIX ) 40 MG tablet Take 40 mg by mouth every morning. 11/22/22   [provider]  polyethylene glycol (MIRALAX  / GLYCOLAX ) 17 g packet Take 17 g by mouth 2 (two) times daily. 01/13/21   Samtani, Jai-Gurmukh, MD  propranolol  (INDERAL ) 20 MG tablet TAKE 1 TABLET (20 MG TOTAL) BY MOUTH 4 (FOUR) TIMES DAILY AS NEEDED (FOR PALPITATIONS OR HEART  RATE GREATER THAN 110). 05/13/23   Nahser, Aleene PARAS, MD  sulfamethoxazole -trimethoprim  (BACTRIM  DS) 800-160 MG tablet Take 1 tablet by mouth 2 (two) times daily. 02/08/21   Dennise Kingsley, MD  tretinoin (RETIN-A) 0.05 % cream Apply topically daily. 01/29/23   [provider]  XARELTO  20 MG TABS tablet Take 20 mg by mouth daily.    [provider]    Physical Exam    Vital Signs:  [name] does not have vital signs available for review today.***  Given telephonic nature of communication, physical exam is limited. AAOx3. NAD. Normal affect.  Speech and respirations are unlabored.  Accessory Clinical Findings    None  Assessment & Plan    1.  Preoperative Cardiovascular Risk Assessment: - Patient is RCRI score 0.4%  The patient was advised that if she develops new symptoms prior to surgery to contact our office to arrange for a follow-up visit, and she verbalized understanding.  (Reminder: Include SBE prophylaxis/Antiplatelet/Anticoag Instructions***)  A copy of this note will be routed to requesting surgeon.  Time:   Today, I have spent *** minutes with the patient with telehealth technology discussing  medical history, symptoms, and management plan.     Wyn Raddle, Jackee Shove, NP  10/03/2023, 11:46 AM

## 2023-10-04 ENCOUNTER — Ambulatory Visit: Attending: Cardiology

## 2023-10-04 DIAGNOSIS — Z01810 Encounter for preprocedural cardiovascular examination: Secondary | ICD-10-CM | POA: Diagnosis not present

## 2023-10-21 NOTE — Telephone Encounter (Signed)
 2nd request received.   Will route the preop clearance to the surgeons office.

## 2023-12-09 ENCOUNTER — Encounter: Payer: Self-pay | Admitting: Radiology

## 2023-12-27 ENCOUNTER — Encounter: Payer: Self-pay | Admitting: Cardiology

## 2023-12-31 ENCOUNTER — Encounter: Payer: Self-pay | Admitting: Cardiovascular Disease

## 2024-02-17 ENCOUNTER — Other Ambulatory Visit: Payer: Self-pay | Admitting: Physician Assistant

## 2024-02-17 DIAGNOSIS — R002 Palpitations: Secondary | ICD-10-CM

## 2024-02-17 DIAGNOSIS — I10 Essential (primary) hypertension: Secondary | ICD-10-CM

## 2024-02-18 NOTE — Telephone Encounter (Signed)
 This is a A-Fib clinic pt. Does not have a primary Cardiologist

## 2024-02-18 NOTE — Telephone Encounter (Signed)
 Pt's pharmacy is requesting a refill on medication metoprolol . Would Dr. Inocencio like to refill this medication? Please address

## 2024-02-19 MED ORDER — METOPROLOL SUCCINATE ER 50 MG PO TB24: ORAL_TABLET | ORAL | 1 refills | Status: AC

## 2024-05-15 ENCOUNTER — Ambulatory Visit: Admitting: Internal Medicine
